# Patient Record
Sex: Female | Born: 1950
Health system: Southern US, Community
[De-identification: ages and names within clinical notes are randomized; demographics above are authoritative.]

## PROBLEM LIST (undated history)

## (undated) DIAGNOSIS — R609 Edema, unspecified: Secondary | ICD-10-CM

## (undated) DIAGNOSIS — H269 Unspecified cataract: Secondary | ICD-10-CM

## (undated) DIAGNOSIS — Z5189 Encounter for other specified aftercare: Secondary | ICD-10-CM

## (undated) DIAGNOSIS — M79604 Pain in right leg: Secondary | ICD-10-CM

## (undated) DIAGNOSIS — M1990 Unspecified osteoarthritis, unspecified site: Secondary | ICD-10-CM

## (undated) DIAGNOSIS — R413 Other amnesia: Secondary | ICD-10-CM

## (undated) DIAGNOSIS — K8020 Calculus of gallbladder without cholecystitis without obstruction: Secondary | ICD-10-CM

## (undated) DIAGNOSIS — F419 Anxiety disorder, unspecified: Secondary | ICD-10-CM

## (undated) DIAGNOSIS — Z923 Personal history of irradiation: Secondary | ICD-10-CM

## (undated) DIAGNOSIS — F1020 Alcohol dependence, uncomplicated: Secondary | ICD-10-CM

## (undated) DIAGNOSIS — K635 Polyp of colon: Secondary | ICD-10-CM

## (undated) DIAGNOSIS — I10 Essential (primary) hypertension: Secondary | ICD-10-CM

## (undated) DIAGNOSIS — M79605 Pain in left leg: Secondary | ICD-10-CM

## (undated) DIAGNOSIS — C4310 Malignant melanoma of unspecified eyelid, including canthus: Secondary | ICD-10-CM

## (undated) DIAGNOSIS — E7800 Pure hypercholesterolemia, unspecified: Secondary | ICD-10-CM

## (undated) DIAGNOSIS — Z9221 Personal history of antineoplastic chemotherapy: Secondary | ICD-10-CM

## (undated) DIAGNOSIS — C3490 Malignant neoplasm of unspecified part of unspecified bronchus or lung: Secondary | ICD-10-CM

## (undated) DIAGNOSIS — D649 Anemia, unspecified: Secondary | ICD-10-CM

## (undated) HISTORY — DX: Malignant melanoma of unspecified eyelid, including canthus: C43.10

## (undated) HISTORY — DX: Polyp of colon: K63.5

## (undated) HISTORY — DX: Pain in left leg: M79.605

## (undated) HISTORY — DX: Personal history of antineoplastic chemotherapy: Z92.21

## (undated) HISTORY — DX: Edema, unspecified: R60.9

## (undated) HISTORY — DX: Calculus of gallbladder without cholecystitis without obstruction: K80.20

## (undated) HISTORY — PX: OTHER SURGICAL HISTORY: SHX169

## (undated) HISTORY — DX: Anemia, unspecified: D64.9

## (undated) HISTORY — DX: Unspecified osteoarthritis, unspecified site: M19.90

## (undated) HISTORY — DX: Encounter for other specified aftercare: Z51.89

## (undated) HISTORY — DX: Personal history of irradiation: Z92.3

## (undated) HISTORY — DX: Unspecified cataract: H26.9

## (undated) HISTORY — PX: COLONOSCOPY: SHX174

## (undated) HISTORY — PX: POLYPECTOMY: SHX149

## (undated) HISTORY — PX: TUBAL LIGATION: SHX77

## (undated) HISTORY — DX: Pain in right leg: M79.604

---

## 1998-02-06 ENCOUNTER — Other Ambulatory Visit: Admission: RE | Admit: 1998-02-06 | Discharge: 1998-02-06 | Payer: Self-pay | Admitting: Obstetrics and Gynecology

## 2002-04-21 ENCOUNTER — Other Ambulatory Visit: Admission: RE | Admit: 2002-04-21 | Discharge: 2002-04-21 | Payer: Self-pay | Admitting: Obstetrics and Gynecology

## 2003-05-03 ENCOUNTER — Other Ambulatory Visit: Admission: RE | Admit: 2003-05-03 | Discharge: 2003-05-03 | Payer: Self-pay | Admitting: Obstetrics and Gynecology

## 2004-06-17 ENCOUNTER — Other Ambulatory Visit: Admission: RE | Admit: 2004-06-17 | Discharge: 2004-06-17 | Payer: Self-pay | Admitting: Obstetrics and Gynecology

## 2004-07-01 ENCOUNTER — Ambulatory Visit: Payer: Self-pay

## 2004-07-03 ENCOUNTER — Ambulatory Visit: Payer: Self-pay | Admitting: *Deleted

## 2004-08-05 ENCOUNTER — Ambulatory Visit: Payer: Self-pay | Admitting: Cardiology

## 2004-08-12 ENCOUNTER — Ambulatory Visit: Payer: Self-pay | Admitting: Cardiovascular Disease

## 2004-08-29 ENCOUNTER — Ambulatory Visit: Payer: Self-pay | Admitting: Internal Medicine

## 2005-03-18 ENCOUNTER — Ambulatory Visit: Payer: Self-pay | Admitting: Cardiology

## 2005-07-16 ENCOUNTER — Other Ambulatory Visit: Admission: RE | Admit: 2005-07-16 | Discharge: 2005-07-16 | Payer: Self-pay | Admitting: Obstetrics and Gynecology

## 2005-10-17 ENCOUNTER — Ambulatory Visit: Payer: Self-pay | Admitting: Cardiology

## 2005-11-19 ENCOUNTER — Ambulatory Visit: Payer: Self-pay | Admitting: Internal Medicine

## 2005-12-10 ENCOUNTER — Ambulatory Visit: Payer: Self-pay | Admitting: Internal Medicine

## 2005-12-11 ENCOUNTER — Ambulatory Visit: Payer: Self-pay | Admitting: Internal Medicine

## 2006-03-18 ENCOUNTER — Ambulatory Visit: Payer: Self-pay | Admitting: Internal Medicine

## 2006-07-07 ENCOUNTER — Ambulatory Visit: Payer: Self-pay

## 2006-07-09 ENCOUNTER — Ambulatory Visit: Payer: Self-pay | Admitting: Cardiology

## 2006-12-22 ENCOUNTER — Ambulatory Visit: Payer: Self-pay

## 2006-12-22 LAB — CONVERTED CEMR LAB
ALT: 25 units/L (ref 0–40)
AST: 31 units/L (ref 0–37)
Albumin: 4.3 g/dL (ref 3.5–5.2)
Alkaline Phosphatase: 26 units/L — ABNORMAL LOW (ref 39–117)
VLDL: 11 mg/dL (ref 0–40)

## 2007-01-04 ENCOUNTER — Ambulatory Visit: Payer: Self-pay | Admitting: Internal Medicine

## 2007-03-31 ENCOUNTER — Encounter (INDEPENDENT_AMBULATORY_CARE_PROVIDER_SITE_OTHER): Payer: Self-pay | Admitting: *Deleted

## 2007-04-20 ENCOUNTER — Telehealth (INDEPENDENT_AMBULATORY_CARE_PROVIDER_SITE_OTHER): Payer: Self-pay | Admitting: *Deleted

## 2007-05-18 ENCOUNTER — Ambulatory Visit: Payer: Self-pay | Admitting: Internal Medicine

## 2007-05-18 DIAGNOSIS — I10 Essential (primary) hypertension: Secondary | ICD-10-CM | POA: Insufficient documentation

## 2007-05-18 DIAGNOSIS — E785 Hyperlipidemia, unspecified: Secondary | ICD-10-CM | POA: Insufficient documentation

## 2007-05-19 ENCOUNTER — Encounter: Payer: Self-pay | Admitting: Internal Medicine

## 2007-05-19 LAB — CONVERTED CEMR LAB
Calcium: 10.2 mg/dL (ref 8.4–10.5)
Eosinophils Absolute: 0.2 10*3/uL (ref 0.0–0.6)
Eosinophils Relative: 2.5 % (ref 0.0–5.0)
GFR calc Af Amer: 96 mL/min
GFR calc non Af Amer: 79 mL/min
Glucose, Bld: 98 mg/dL (ref 70–99)
Lymphocytes Relative: 21.3 % (ref 12.0–46.0)
MCHC: 34.8 g/dL (ref 30.0–36.0)
MCV: 94.5 fL (ref 78.0–100.0)
Monocytes Relative: 7.3 % (ref 3.0–11.0)
Neutro Abs: 5.1 10*3/uL (ref 1.4–7.7)
Platelets: 287 10*3/uL (ref 150–400)

## 2007-07-09 ENCOUNTER — Ambulatory Visit: Payer: Self-pay | Admitting: Internal Medicine

## 2007-07-09 LAB — CONVERTED CEMR LAB
AST: 29 units/L (ref 0–37)
Albumin: 4.1 g/dL (ref 3.5–5.2)
Alkaline Phosphatase: 33 units/L — ABNORMAL LOW (ref 39–117)
Direct LDL: 151.1 mg/dL
HDL: 79.6 mg/dL (ref >39.0)
Total Bilirubin: 0.9 mg/dL (ref 0.3–1.2)

## 2007-07-12 ENCOUNTER — Ambulatory Visit: Payer: Self-pay | Admitting: Cardiology

## 2008-01-27 ENCOUNTER — Ambulatory Visit: Payer: Self-pay | Admitting: Internal Medicine

## 2008-02-01 ENCOUNTER — Ambulatory Visit: Payer: Self-pay | Admitting: Internal Medicine

## 2008-02-01 LAB — CONVERTED CEMR LAB
Alkaline Phosphatase: 35 units/L — ABNORMAL LOW (ref 39–117)
Bilirubin, Direct: 0.1 mg/dL (ref 0.0–0.3)
Total Bilirubin: 0.6 mg/dL (ref 0.3–1.2)
Total CHOL/HDL Ratio: 3.3
VLDL: 13 mg/dL (ref 0–40)

## 2008-03-16 ENCOUNTER — Ambulatory Visit: Payer: Self-pay | Admitting: Internal Medicine

## 2008-03-16 LAB — CONVERTED CEMR LAB
Direct LDL: 211.2 mg/dL
HDL: 81.3 mg/dL (ref >39.0)
Total Bilirubin: 1.1 mg/dL (ref 0.3–1.2)
Total CHOL/HDL Ratio: 3.7
Triglycerides: 75 mg/dL (ref 0–149)
VLDL: 15 mg/dL (ref 0–40)

## 2008-04-08 ENCOUNTER — Telehealth (INDEPENDENT_AMBULATORY_CARE_PROVIDER_SITE_OTHER): Payer: Self-pay | Admitting: *Deleted

## 2008-04-20 ENCOUNTER — Ambulatory Visit: Payer: Self-pay | Admitting: Cardiovascular Disease

## 2008-04-20 LAB — CONVERTED CEMR LAB
ALT: 29 units/L (ref 0–35)
Cholesterol: 234 mg/dL (ref 0–200)
Direct LDL: 108.1 mg/dL
HDL: 83.5 mg/dL (ref >39.0)
Total Protein: 6.7 g/dL (ref 6.0–8.3)
VLDL: 28 mg/dL (ref 0–40)

## 2008-04-27 ENCOUNTER — Ambulatory Visit: Payer: Self-pay | Admitting: Internal Medicine

## 2008-09-21 ENCOUNTER — Telehealth (INDEPENDENT_AMBULATORY_CARE_PROVIDER_SITE_OTHER): Payer: Self-pay | Admitting: *Deleted

## 2008-10-16 ENCOUNTER — Ambulatory Visit: Payer: Self-pay

## 2008-10-16 LAB — CONVERTED CEMR LAB
AST: 28 units/L (ref 0–37)
Albumin: 4.3 g/dL (ref 3.5–5.2)
Alkaline Phosphatase: 35 units/L — ABNORMAL LOW (ref 39–117)
Bilirubin, Direct: 0.1 mg/dL (ref 0.0–0.3)
Cholesterol: 244 mg/dL (ref 0–200)
Total CHOL/HDL Ratio: 2.9

## 2008-10-19 ENCOUNTER — Ambulatory Visit: Payer: Self-pay | Admitting: Internal Medicine

## 2008-11-14 ENCOUNTER — Ambulatory Visit: Payer: Self-pay | Admitting: Internal Medicine

## 2008-11-14 DIAGNOSIS — F17200 Nicotine dependence, unspecified, uncomplicated: Secondary | ICD-10-CM

## 2008-11-15 ENCOUNTER — Encounter (INDEPENDENT_AMBULATORY_CARE_PROVIDER_SITE_OTHER): Payer: Self-pay | Admitting: *Deleted

## 2008-11-15 LAB — CONVERTED CEMR LAB
BUN: 15 mg/dL (ref 6–23)
Basophils Absolute: 0 10*3/uL (ref 0.0–0.1)
Chloride: 104 meq/L (ref 96–112)
Creatinine, Ser: 0.8 mg/dL (ref 0.4–1.2)
Eosinophils Absolute: 0.3 10*3/uL (ref 0.0–0.7)
Glucose, Bld: 104 mg/dL — ABNORMAL HIGH (ref 70–99)
HCT: 39.8 % (ref 36.0–46.0)
Lymphs Abs: 1.8 10*3/uL (ref 0.7–4.0)
MCHC: 34.5 g/dL (ref 30.0–36.0)
MCV: 94.2 fL (ref 78.0–100.0)
Monocytes Absolute: 0.8 10*3/uL (ref 0.1–1.0)
Monocytes Relative: 7.6 % (ref 3.0–12.0)
Neutro Abs: 7.1 10*3/uL (ref 1.4–7.7)
Platelets: 231 10*3/uL (ref 150.0–400.0)
RDW: 11.6 % (ref 11.5–14.6)
TSH: 1.51 microintl units/mL (ref 0.35–5.50)

## 2008-11-16 LAB — CONVERTED CEMR LAB: Vit D, 25-Hydroxy: 14 ng/mL — ABNORMAL LOW (ref 30–89)

## 2008-11-27 ENCOUNTER — Telehealth (INDEPENDENT_AMBULATORY_CARE_PROVIDER_SITE_OTHER): Payer: Self-pay | Admitting: *Deleted

## 2009-01-18 ENCOUNTER — Telehealth (INDEPENDENT_AMBULATORY_CARE_PROVIDER_SITE_OTHER): Payer: Self-pay | Admitting: *Deleted

## 2009-04-16 ENCOUNTER — Ambulatory Visit: Payer: Self-pay | Admitting: Cardiology

## 2009-04-16 LAB — CONVERTED CEMR LAB
ALT: 24 units/L (ref 0–35)
AST: 28 units/L (ref 0–37)
Alkaline Phosphatase: 36 units/L — ABNORMAL LOW (ref 39–117)
Bilirubin, Direct: 0 mg/dL (ref 0.0–0.3)
Total Bilirubin: 0.8 mg/dL (ref 0.3–1.2)
Total CHOL/HDL Ratio: 2

## 2009-04-19 ENCOUNTER — Ambulatory Visit: Payer: Self-pay | Admitting: Cardiovascular Disease

## 2009-07-03 ENCOUNTER — Encounter: Payer: Self-pay | Admitting: Internal Medicine

## 2009-10-01 ENCOUNTER — Ambulatory Visit: Payer: Self-pay | Admitting: Cardiology

## 2009-10-04 ENCOUNTER — Ambulatory Visit: Payer: Self-pay | Admitting: Cardiology

## 2009-10-15 LAB — CONVERTED CEMR LAB
Alkaline Phosphatase: 35 units/L — ABNORMAL LOW (ref 39–117)
Bilirubin, Direct: 0 mg/dL (ref 0.0–0.3)
LDL Cholesterol: 78 mg/dL (ref 0–99)
Total CHOL/HDL Ratio: 2
Total Protein: 6.7 g/dL (ref 6.0–8.3)
VLDL: 26.2 mg/dL (ref 0.0–40.0)

## 2009-12-05 ENCOUNTER — Encounter (INDEPENDENT_AMBULATORY_CARE_PROVIDER_SITE_OTHER): Payer: Self-pay | Admitting: *Deleted

## 2009-12-05 ENCOUNTER — Telehealth (INDEPENDENT_AMBULATORY_CARE_PROVIDER_SITE_OTHER): Payer: Self-pay | Admitting: *Deleted

## 2009-12-20 ENCOUNTER — Telehealth: Payer: Self-pay | Admitting: Cardiology

## 2010-01-28 ENCOUNTER — Telehealth (INDEPENDENT_AMBULATORY_CARE_PROVIDER_SITE_OTHER): Payer: Self-pay | Admitting: *Deleted

## 2010-03-01 ENCOUNTER — Ambulatory Visit: Payer: Self-pay | Admitting: Internal Medicine

## 2010-03-01 DIAGNOSIS — M810 Age-related osteoporosis without current pathological fracture: Secondary | ICD-10-CM

## 2010-03-04 LAB — CONVERTED CEMR LAB
BUN: 14 mg/dL (ref 6–23)
Chloride: 109 meq/L (ref 96–112)
Eosinophils Relative: 2.5 % (ref 0.0–5.0)
GFR calc non Af Amer: 101.64 mL/min (ref >60)
Glucose, Bld: 77 mg/dL (ref 70–99)
HCT: 41.3 % (ref 36.0–46.0)
Hemoglobin: 14.2 g/dL (ref 12.0–15.0)
Lymphs Abs: 1.3 10*3/uL (ref 0.7–4.0)
MCV: 96 fL (ref 78.0–100.0)
Monocytes Absolute: 0.5 10*3/uL (ref 0.1–1.0)
Monocytes Relative: 6 % (ref 3.0–12.0)
Neutro Abs: 5.7 10*3/uL (ref 1.4–7.7)
Platelets: 213 10*3/uL (ref 150.0–400.0)
Potassium: 5.2 meq/L — ABNORMAL HIGH (ref 3.5–5.1)
RDW: 13 % (ref 11.5–14.6)
Sodium: 143 meq/L (ref 135–145)
WBC: 7.8 10*3/uL (ref 4.5–10.5)

## 2010-04-03 ENCOUNTER — Telehealth (INDEPENDENT_AMBULATORY_CARE_PROVIDER_SITE_OTHER): Payer: Self-pay | Admitting: *Deleted

## 2010-06-27 ENCOUNTER — Encounter (INDEPENDENT_AMBULATORY_CARE_PROVIDER_SITE_OTHER): Payer: Self-pay | Admitting: *Deleted

## 2010-09-26 NOTE — Assessment & Plan Note (Signed)
Summary: cpx /cbs--Rm 12   Vital Signs:  Patient profile:   60 year old female Height:      64.5 inches Weight:      141.38 pounds BMI:     23.98 Temp:     98.0 degrees F oral Pulse rate:   72 / minute Pulse rhythm:   regular Resp:     16 per minute BP sitting:   110 / 80  (left arm) Cuff size:   regular CC: Room 12  Physical, fasting. Is Patient Diabetic? No   History of Present Illness: CPX wt stable per her home scale (our scale showed 6 pounds down)  Preventive Screening-Counseling & Management  Alcohol-Tobacco     Alcohol drinks/day: 2-3 glasses on weekend     Alcohol type: wine     Smoking Status: current  Caffeine-Diet-Exercise     Caffeine use/day: 3 cups coffee daily     Does Patient Exercise: yes     Type of exercise: cardio, weights     Exercise (avg: min/session): 1 1/2 hours     Times/week: <3  Allergies: 1)  ! Pcn  Past History:  Past Medical History: Reviewed history from 05/18/2007 and no changes required. Hyperlipidemia Hypertension Osteopenia  Past Surgical History: Reviewed history from 11/14/2008 and no changes required. 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 CV:  Denies chest pain or discomfort, palpitations, and swelling of feet. Resp:  Denies cough and shortness of breath. GI:  Denies bloody stools, nausea, and vomiting. GU:  sees gyn . Psych:  Denies anxiety and depression; sleeps well .  Physical Exam  General:  alert, well-developed, and well-nourished.   Neck:  no masses and no thyromegaly.   Lungs:  normal respiratory effort, no intercostal retractions, no accessory muscle use, and normal breath sounds.   Heart:  normal rate, regular  rhythm, no murmur, and no gallop.   Abdomen:  soft, non-tender, normal bowel sounds, no distention, no masses, no guarding, and no rigidity.   Extremities:  no pretibial edema bilaterally  Neurologic:  alert & oriented X3, strength normal in all extremities, and gait normal.   Psych:  Cognition and judgment appear intact. Alert and cooperative with normal attention span and concentration. not anxious appearing and not depressed appearing.     Impression & Recommendations:  Problem # 1:  HEALTH SCREENING (ICD-V70.0) Td shot 2008  sees gyn: they take care of osteoporosis-MMG-PAP  colonoscopy in 2007 as requested by her gynecologist  (@ [facility], 2 polyps ,  told to repeat in 5 years per patient) continue healthy life style (except for tobacco) tobacco--  counseled , information provided regards the quitting seminar at the hospital on ASA  Orders: TLB-BMP (Basic Metabolic Panel-BMET) (80048-METABOL) TLB-CBC Platelet - w/Differential (85025-CBCD) TLB-TSH (Thyroid Stimulating Hormone) (84443-TSH) Venipuncture (11914)  Problem # 2:  OSTEOPOROSIS (ICD-733.00) per gyn just had a DEXA  Her updated medication list for this problem includes:    Boniva 150 Mg Tabs (Ibandronate sodium) .Marland Kitchen... 1 by mouth qmo  Problem # 3:  HYPERTENSION (ICD-401.9) at goal  Her updated medication list for this problem includes:    Lisinopril 20 Mg Tabs (Lisinopril) .Marland Kitchen... 1 by mouth once daily -  due office visit for additional refills  BP today: 110/80 Prior BP: 162/90 (10/04/2009)  Labs Reviewed: K+: 4.0 (11/14/2008) Creat: : 0.8 (11/14/2008)   Chol: 184 (10/01/2009)   HDL: 80.10 (10/01/2009)   LDL: 78 (10/01/2009)   TG: 131.0 (10/01/2009)  Problem # 4:  HYPERLIPIDEMIA (ICD-272.4) at goal  used  to see the cholesterol clinic, they recommended followup here. Her updated medication list for this problem includes:    Zetia 10 Mg Tabs (Ezetimibe) .Marland Kitchen... Take 1 tablet by mouth once a day    Lipitor 80 Mg Tabs  (Atorvastatin calcium) .Marland Kitchen... 1  by mouth once daily  Labs Reviewed: SGOT: 36 (10/01/2009)   SGPT: 36 (10/01/2009)   HDL:80.10 (10/01/2009), 82.40 (04/16/2009)  LDL:78 (10/01/2009), 108 (16/05/9603)  Chol:184 (10/01/2009), 199 (04/16/2009)  Trig:131.0 (10/01/2009), 43.0 (04/16/2009)  Complete Medication List: 1)  Zetia 10 Mg Tabs (Ezetimibe) .... Take 1 tablet by mouth once a day 2)  Lipitor 80 Mg Tabs (Atorvastatin calcium) .Marland Kitchen.. 1  by mouth once daily 3)  Lisinopril 20 Mg Tabs (Lisinopril) .Marland Kitchen.. 1 by mouth once daily - due office visit for additional refills 4)  Boniva 150 Mg Tabs (Ibandronate sodium) .Marland Kitchen.. 1 by mouth qmo 5)  Baby Aspirin 81 Mg Chew (Aspirin) .Marland Kitchen.. 1  a day 6)  Calcium and Vit. D Every Day   Patient Instructions: 1)  Please schedule a follow-up appointment in 6 months .  Prescriptions: LISINOPRIL 20 MG  TABS (LISINOPRIL) 1 by mouth once daily - DUE OFFICE VISIT FOR ADDITIONAL REFILLS  #90 x 3   Entered and Authorized by:   Nolon Rod. Jaylee Freeze MD   Signed by:   Nolon Rod. Moustafa Mossa MD on 03/01/2010   Method used:   Print then Give to Patient   RxID:   [id number] LIPITOR 80 MG TABS (ATORVASTATIN CALCIUM) 1  by mouth once daily  #90 x 3   Entered and Authorized by:   Nolon Rod. Jefferie Holston MD   Signed by:   Nolon Rod. Delise Simenson MD on 03/01/2010   Method used:   Print then Give to Patient   RxID:   [id number] ZETIA 10 MG TABS (EZETIMIBE) Take 1 tablet by mouth once a day  #90 x 3   Entered and Authorized by:   Nolon Rod. Taliyah Watrous MD   Signed by:   Nolon Rod. Dacian Orrico MD on 03/01/2010   Method used:   Print then Give to Patient   RxID:   [id number]   Current Allergies (reviewed today): ! PCN   Preventive Care Screening  Bone Density:    Date:  09/25/2009    Results:  abnormal--osteoporosis hips std dev  Mammogram:    Date:  09/25/2009    Results:  normal   Pap Smear:    Date:  09/25/2009    Results:  normal

## 2010-09-26 NOTE — Miscellaneous (Signed)
Summary: got @ [facility]   Immunization History:  Influenza Immunization History:    Influenza:  got @ [facility]  (06/24/2010) 

## 2010-09-26 NOTE — Letter (Signed)
Summary: Primary Care Appointment Letter  [facility] [place]  [address] [place], [place] [address]   Phone: [phone]  Fax: [phone]    12/05/2009 MRN: [id number]  [name] [name] [address] [place], [place]  [address]  Dear Ms. Schwarting,   Your Primary Care Physician McFarlan E. Paz MD has indicated that:    ____x___it is time to schedule an appointment.  Please call our office 2 [phone] to schedule a complete physical exam with Dr. Drue Novel.      Thank you,    [facility] Primary [facility] Scheduler

## 2010-09-26 NOTE — Progress Notes (Signed)
Summary: refill  Phone Note Refill Request Message from:  Fax from Pharmacy on December 05, 2009 4:02 PM  Refills Requested: Medication #1:  LISINOPRIL 20 MG  TABS 1 by mouth qd [facility] [phone]   Method Requested: Fax to Local Pharmacy Next Appointment Scheduled: no appt Initial call taken by: Barb Merino,  December 05, 2009 4:02 PM    New/Updated Medications: LISINOPRIL 20 MG  TABS (LISINOPRIL) 1 by mouth once daily - DUE OFFICE VISIT FOR ADDITIONAL REFILLS Prescriptions: LISINOPRIL 20 MG  TABS (LISINOPRIL) 1 by mouth once daily - DUE OFFICE VISIT FOR ADDITIONAL REFILLS  #90 x 0   Entered by:   Shary Decamp   Authorized by:   Nolon Rod. Paz MD   Signed by:   Shary Decamp on 12/05/2009   Method used:   Printed then faxed to ...       [facility])       [address] [address]       [place], [place]  [address]       Ph: [phone]       Fax: [phone]   RxID:   [id number]

## 2010-09-26 NOTE — Progress Notes (Signed)
Summary: FYI  Phone Note Call from Patient Call back at [facility] Phone [phone]   Caller: Patient Summary of Call: Patient called to cancel her CPX appt because she just found out that her husband's job does not offer benefits. They are trying to work something out this week with getting new insurance and wanted to make sure her medicines could still be refilled. She is going to call back this week and reschedule her CPX appt.  Initial call taken by: Harold Barban,  January 28, 2010 8:33 AM  Follow-up for Phone Call        ok RF all  meds x 6 months  Follow-up by: Nolon Rod. Paz MD,  January 28, 2010 8:40 AM  Additional Follow-up for Phone Call Additional follow up Details #1::        DISCUSS WITH PATIENT, NO REFILLS NEEDED AT THIS TIM will call for refills..............Marland KitchenFelecia Deloach CMA  January 28, 2010 9:00 AM

## 2010-09-26 NOTE — Progress Notes (Signed)
Summary: refill  Phone Note Refill Request Message from:  Patient  Refills Requested: Medication #1:  ZETIA 10 MG TABS Take 1 tablet by mouth once a day  Medication #2:  LIPITOR 80 MG TABS 1  by mouth once daily  Medication #3:  LISINOPRIL 20 MG  TABS 1 by mouth once daily - DUE OFFICE VISIT FOR ADDITIONAL REFILLS [facility] patient was given rx at [facility] -she misplaced it    Initial call taken by: Okey Regal [place],  April 03, 2010 3:27 PM    Prescriptions: LISINOPRIL 20 MG  TABS (LISINOPRIL) 1 by mouth once daily - DUE OFFICE VISIT FOR ADDITIONAL REFILLS  #90 x 3   Entered by:   Army Fossa CMA   Authorized by:   Nolon Rod. Paz MD   Signed by:   Army Fossa CMA on 04/03/2010   Method used:   Faxed to ...       [facility] [id number] (retail)       [address]       [place], [place]  [address]       Ph: [phone]       Fax: [phone]   RxID:   [id number] LIPITOR 80 MG TABS (ATORVASTATIN CALCIUM) 1  by mouth once daily  #90 x 3   Entered by:   Army Fossa CMA   Authorized by:   Nolon Rod. Paz MD   Signed by:   Army Fossa CMA on 04/03/2010   Method used:   Faxed to ...       [id number]* (retail)       [address]       [place], [place]  [address]       Ph: [phone]       Fax: [phone]   RxID:   [id number] ZETIA 10 MG TABS (EZETIMIBE) Take 1 tablet by mouth once a day  #90 x 3   Entered by:   Army Fossa CMA   Authorized by:   Nolon Rod. Paz MD   Signed by:   Army Fossa CMA on 04/03/2010   Method used:   Faxed to ...       [facility] [id number] (retail)       [address]       [place], [place]  [address]       Ph: [phone]       Fax: [phone]   RxID:   [id number]

## 2010-09-26 NOTE — Assessment & Plan Note (Signed)
Summary: rov/tm   Visit Type:  Follow-up   [name] [name] comes in today for 6 month follow-up of her hyperlipidemia therapy.  She denies any new muscle aches or pains.  She has been compliant with her medications.    Lipid Management Provider  Charolotte Eke, PharmD  Preventive Screening-Counseling & Management  Alcohol-Tobacco     Smoking Status: current     Smoking Cessation Counseling: yes     Smoke Cessation Stage: contemplative  Current Medications (verified): 1)  Zetia 10 Mg Tabs (Ezetimibe) .... Take 1 Tablet By Mouth Once A Day 2)  Lipitor 80 Mg Tabs (Atorvastatin Calcium) .Marland Kitchen.. 1  By Mouth Once Daily 3)  Lisinopril 20 Mg  Tabs (Lisinopril) .Marland Kitchen.. 1 By Mouth Qd 4)  Boniva 150 Mg  Tabs (Ibandronate Sodium) .Marland Kitchen.. 1 By Mouth Qmo 5)  Baby Aspirin 81 Mg Chew (Aspirin) .Marland Kitchen.. 1  A Day 6)  Calcium and Vit. D Every Day  Allergies (verified): 1)  ! Pcn   Vital Signs:  Patient profile:   60 year old female Weight:      147 pounds Pulse rate:   80 / minute BP sitting:   162 / 90  (left arm)  Impression & Recommendations:  Problem # 1:  HYPERLIPIDEMIA (ICD-272.4) [name] [name] is tolerating her current medications.  Her TC, TG, HDL, and LDL are all at goals.  Her LFTs are normal.  She will continue current medications and follow-up with her PCP for future monitoring.  If something changes, we would be glad to see her again.  Her updated medication list for this problem includes:    Zetia 10 Mg Tabs (Ezetimibe) .Marland Kitchen... Take 1 tablet by mouth once a day    Lipitor 80 Mg Tabs (Atorvastatin calcium) .Marland Kitchen... 1  by mouth once daily  Problem # 2:  HYPERTENSION (ICD-401.9) BP today was 150/95, repeated by an RN it was 162/90.  HR 80.  She has been compliant with her BP med. and has been feeling fine.  She will recheck it at home tonight and over the next few days.  She will call Dr. Leta Jungling office if SBP remains >140.  She also has an OB/GYN appointment in March.  Her updated medication  list for this problem includes:    Lisinopril 20 Mg Tabs (Lisinopril) .Marland Kitchen... 1 by mouth qd    Baby Aspirin 81 Mg Chew (Aspirin) .Marland Kitchen... 1  a day  Problem # 3:  TOBACCO ABUSE (ICD-305.1) I again encouraged [name] [name] to try to quit smoking.  We reviewed the available methods available.  She wants to stop but hasn't had the will power.

## 2010-09-26 NOTE — Progress Notes (Signed)
Summary: refill**[facility]**   Phone Note Refill Request Message from:  Pharmacy on December 20, 2009 11:52 AM  Refills Requested: Medication #1:  LIPITOR 80 MG TABS 1  by mouth once daily   Supply Requested: 3 months [facility] [phone] red ID [id number], per Olegario Messier   Method Requested: Telephone to Pharmacy Initial call taken by: Migdalia Dk,  December 20, 2009 11:53 AM  Follow-up for Phone Call        Filled RX X1. Pt is to get additional refills from Dr. Drue Novel per Charolotte Eke note on 10/04/09. Marrion Coy, CNA  December 20, 2009 12:04 PM  Follow-up by: Marrion Coy, CNA,  December 20, 2009 12:04 PM

## 2011-01-07 NOTE — Assessment & Plan Note (Signed)
[facility]                               LIPID CLINIC NOTE   NAME:[name] [name], [name] [name]                MRN:          [id number]  DATE:04/27/2008                            DOB:          1951-04-13    PAST MEDICAL HISTORY:  1. Hyperlipidemia.  2. Osteoporosis.  3. Hypertension.   MEDICATIONS:  1. Boniva.  2. Calcium with vitamin D twice a day.  3. Zetia 10 mg a day.  4. Lisinopril 20 mg a day.  5. Lipitor 80 mg a day.   PHYSICAL EXAMINATION:  VITAL SIGNS:  Blood pressure 142/80, heart rate  70, and weight 145 pounds.   LABORATORY DATA:  Total cholesterol 234, triglyceride 141, HDL of 83,  and LDL 122.   ASSESSMENT:  [name] is a very pleasant 60 year old woman who  returns to Lipid Clinic today with no chest pain, no shortness of  breath, no muscle aches or pains.  She is very very pleased to report  that after switching from Lipitor 80 mg large tablet to 20 mg taking 4  tablets each evening and changing her administration time from bedtime  to her evening meal.  She has become much more compliant with her  Lipitor.  She had previously not been taking it because of not being  able to swallow the large pill and forgetting it to take at bedtime.  With these changes, she has been compliant with her medication and has  brought her total cholesterol slightly above goal of less than 200,  triglycerides at goal of less than 150, HDL at goal of greater than 40,  and LDL improved from 211 down to 122, but still slightly above goal of  less than 100.  She is primary risk reduction because she is a smoker  and hypertension, we will are encouraging her to decrease her LDL to  less than 100.  She is very conscientious of her diet.  She says that  she tries to eat low-fat and low-carbohydrate meals.  However, she does  occasionally splurge and have Pizza or high fat content, sweet choices  whenever she goes out.  For the most part, she eats lots  of vegetables,  low-fat protein, and small amount of carbohydrates.  She will be more  conscientious to decrease her portion sizes and decreased the amount of  fat in her diet.  For exercise, she is working out with a trainer 3  times a week and I have encouraged her on a fourth day to do a cardio  workout whether it would be walking or riding a bike, stationary bike,  etc.   PLAN:  1. Continue current medication Lipitor 80 mg daily and Zetia 10 mg      daily.  2. Continue low-fat and low-carbohydrate diet.  3. Continue exercise and possibly add an 1 extra day of cardia      workout.  4. Follow on visit for 6 months.  Lipid panel and LFTs.  We will make      adjustments at that time.      Leota Sauers, PharmD  Electronically Signed  Thomas C. Daleen Squibb, MD, [facility]  Electronically Signed   LC/MedQ  DD: 04/27/2008  DT: 04/28/2008  Job #: [id number]

## 2011-01-07 NOTE — Assessment & Plan Note (Signed)
[facility]                               LIPID CLINIC NOTE   NAME:Gfeller, Carolyne                MRN:          [id number]  DATE:07/12/2007                            DOB:          1951-04-15    Ms. Siracusa is seen in the lipid clinic for further evaluation and  medication titration associated with her hyperlipidemia in the setting  of multiple risk factors.  The patient states that she has been  compliant with her Lipitor 40 mg daily, her Boniva and her Zetia.  She  states that she has been taking lisinopril 10 mg p.o. daily.  She has  questions about her blood pressure today, as well as her lipid panel.  She states that she has been exercising with her personal trainer twice  a week.  She states that she has been following a relatively low-fat,  low-cholesterol diet, and states that she has not missed doses of her  Lipitor.   PAST MEDICAL HISTORY:  1. Hypertension.  2. Hyperlipidemia.  3. High-risk features.  4. Smoking, of which she continues currently and is not willing to      discuss quitting today.  5. She has continued to choose relatively low-fat, low-cholesterol      foods.   CURRENT MEDICATIONS:  1. Boniva 150 mg once a month.  2. Calcium with D daily.  3. Viactiv chews.  4. Lisinopril 10 mg daily.  5. Zetia 10 mg daily.   REVIEW OF SYSTEMS:  As stated in the HPI and otherwise negative.   PHYSICAL EXAMINATION:  Weight is 140 pounds, blood pressure is 153/86,  heart rate is 72, respirations are 16.   Labs on November 14 reveal normal liver function tests and alk that is  slightly low at 33.  Total cholesterol 236, triglycerides 59, HDL 79.6,  LDL direct 151.1.   ASSESSMENT:  1. The patient states she has not been missing doses, though in the      past when she has taken her Lipitor 40 mg daily she has been      compliant, and her labs for her LDL has been in the 100s.  This      value in light of that history and  her periodic noncompliance makes      me question if she is actively taking her medication as prescribed.      I have asked the patient to increase, and she has agreed to      increase, the Lipitor 80 mg daily, continuing her Zetia for now.      Followup will be in 12 weeks for that.  2. Blood pressure:  The patient's blood pressure is elevated in the      office today.  She states that Dr. Drue Novel did not choose to change      medicines the last time she was in his office when it was elevated.      Today I will increase her lisinopril to 20 mg p.o. daily.  She      understands that she is to come back in within 7 to 14 days,  preferably prior to thanksgiving for a metabolic      panel to assess her renal function and potassium.  She agrees to      comply with this request.  Followup is scheduled in 2 weeks for lab      work and in 12 weeks, otherwise.      Shelby Dubin, PharmD, BCPS, CPP  Electronically Signed      Rollene Rotunda, MD, [facility]  Electronically Signed   MP/MedQ  DD: 07/14/2007  DT: 07/15/2007  Job #: [id number]   cc:   [name], MD

## 2011-01-07 NOTE — Assessment & Plan Note (Signed)
[facility]                               LIPID CLINIC NOTE   NAME:Burbano, Raiana                MRN:          [id number]  DATE:10/19/2008                            DOB:          07/01/1951    Ms. Rabe comes in today for followup of her hyperlipidemia  therapy, which includes Lipitor 40 mg daily and Zetia 10 mg daily.  She  has been compliant with these medications for the most part, although  she admits to missing Lipitor dose 1 day per week.  She denies any  muscle aches, pains, or any other problems.  Although, she continues as  she has in the past to complain of swallowing large tablets.   Current medications have not changed and they include Boniva, calcium  with vitamin D, and lisinopril.   PHYSICAL EXAMINATION:  VITAL SIGNS:  Her weight is 145 pounds, blood  pressure is 110/80, and heart rate is 80.   LABORATORY DATA:  Total cholesterol 244, triglycerides 75, HDL 83.7, and  LDL 138.2.  Liver enzymes are within normal limits.   ASSESSMENT:  Mr. Test LDL remains above goal.  Her  triglycerides are improved and are still at goal and HDL is quite high  and greater than goal.  We talked about compliance with the Lipitor  being the important thing.  She would like to continue with the current  doses of Lipitor, but increase her compliance as well as her amount of  exercise.   PLAN:  We are going to continue with the current cholesterol medications  and encouraging compliance.  We will increase Lipitor to 80 mg daily  next time if her LDL remains greater than a goal of less than 100.  I  encouraged her with her exercise program and diet and we will follow up  with her in 6 months.      Charolotte Eke, PharmD  Electronically Signed      Rollene Rotunda, MD, [facility]  Electronically Signed   TP/MedQ  DD: 10/19/2008  DT: 10/20/2008  Job #: [id number]

## 2011-01-07 NOTE — Assessment & Plan Note (Signed)
[facility]                               LIPID CLINIC NOTE   NAME:Bartelson, Phelan                MRN:          [id number]  DATE:01/04/2007                            DOB:          [birth year]    [name] is seen back in the lipid clinic for further evaluation,  medication evaluation, and medication titration associated with her  Lipitor 40 mg daily and Zetia 10 mg daily.  The patient states that she  has been compliant and tolerates this double therapy nicely.  The  patient states she has resumed smoking and may reconsider use of  Chantix.  She states that she has begun training with a new physical  trainer twice weekly as of February.  She continues to use [employer]  products for lunches and drinks V-8 low sodium drinks for breakfast.  The patient's weight is 144-3/4 pounds.  Her heart rate is 68, and her  respirations are 17.   PAST MEDICAL HISTORY:  Pertinent for high risk features and history of  tobacco abuse, hypertension.   LABORATORY DATA:  On Jan 04, 2007, revealed total cholesterol 191,  triglycerides 57, HDL 75.4, LDL 104.  LFTs are within normal limits.   ASSESSMENT:  The patient approaches lipid lowering goals on combination  lipid lowering therapy.  Unfortunately, she has continued to use tobacco  products on a regular basis.   PLAN:  1. Medication therapy.  The patient will continue Lipitor 40 mg daily      and Zetia 10 mg daily.  2. Physical activity.  The patient will continue working out with her      trainer and add 2-3 additional workouts each week, working towards      at least 30 minutes of brisk, physical activity most days of the      week as per [facility] guideline recommendations.  3. The patient will work to increase her lean fish and lean meat      intake.  Increase her vegetable intake, reducing her simple      carbohydrates.  __________ extensively with the patient on methods      to decrease her carb  intake and substitute whole grain pasta and      breads, etc.  The patient will follow up in 6 months, November 13th      at 3 p.m. with labs just before.   Thank you for the opportunity to see this nice patient.      Shelby Dubin, PharmD, BCPS, CPP       Rollene Rotunda, MD, [facility]    MP/MedQ  DD: 01/06/2007  DT: 01/06/2007  Job #: [id number]   cc:   [name], MD

## 2011-01-07 NOTE — Assessment & Plan Note (Signed)
[facility]                               LIPID CLINIC NOTE   NAME:[name] [name]                MRN:          [id number]  DATE:01/27/2008                            DOB:          [birth year]    Ms. [name] [name] comes in today for followup of her hyperlipidemia  therapy which includes Zetia 10 mg daily and Lipitor 80 mg daily.  She  has been complaint with both of these and tolerating them just fine with  no muscle aches or pains.   Other medications are unchanged and they include Boniva, calcium with  vitamin D, and Lisinopril.   PHYSICAL EXAMINATION:  Includes a weight of 142 pounds, blood pressure  is 125/70, heart rate is 80.   We have no current lab values for this visit.   ASSESSMENT:  Ms. [name] [name] appears to be tolerating her medications  fine which include the Lipitor which was recently increased from 40 mg  to 80 mg daily.  We will get the blood drawn on June 9, and we will  review those results and call her with the plan and make a followup  appointment at that time.  The rest of the visit with [name] [name]  was spent talking about smoking cessation which has been a challenge for  her for quite some time.  We talked about different strategies including  using a stop date, using nicotine replacement such as gum or patches,  using Wellbutrin or mood stabilizing, and using Chantix.  She was  resistant to using Chantix.  She had tried Wellbutrin in the past but  failed to stop smoking.  She has never tried any nicotine replacement.  I encouraged her to think about the advantages of stopping smoking  including financial, in addition to bringing her blood pressure under  control and possibly even eliminating the need to take medicine to  reduce it.  She seemed encouraged, and I told her we would be available  to counsel her and help her lipids problem in the future.      Charolotte Eke, PharmD  Electronically Signed      Rollene Rotunda, MD, [facility]  Electronically Signed   TP/MedQ  DD: 01/27/2008  DT: 01/27/2008  Job #: [id number]

## 2011-01-10 NOTE — Assessment & Plan Note (Signed)
[facility]                               LIPID CLINIC NOTE   NAME:[name], [name]                MRN:          [id number]  DATE:02/01/2008                            DOB:          Nov 06, 1950    Telephone call to the patient is 3:45 p.m.  [name] is a well-  known to me from the lipid clinic.  She was followed up last week, and  had not had labs drawn at that time.  Labs are now available.  They  reveal normal LFTs, excepting alkaline phosphatase of 35, total  cholesterol is 255, triglycerides 67, HDL 76.6, LDL direct is 166.5.  I  have called the patient this afternoon and spoken directly with her.  I  have reviewed her lab results with her.  She states that in a given 7-  day period of time, she missed 2 or 3 doses of her cholesterol  medication because she takes it at night.  We discussed the importance  of adherence and compliance with this, and I have asked her to move her  medicine, at her suggestion, to first thing in the morning with her  blood pressure medicine.  She agrees to do this.  We have agreed that  she can do this for a period of 4-6 weeks.  We will check her labs back  on Wednesday, 07/22.  She will come in for fasting blood work, lipid and  liver on that day, and will telephone her to follow up on those labs at  that time.  She will call with questions or problems such as muscle  aches, pain, darkening of urine, side pain, flank pain in the meantime.      Shelby Dubin, PharmD, BCPS, CPP  Electronically Signed      Bevelyn Buckles. Bensimhon, MD  Electronically Signed   MP/MedQ  DD: 02/02/2008  DT: 02/02/2008  Job #: [id number]

## 2011-01-10 NOTE — Assessment & Plan Note (Signed)
[facility]                                 LIPID CLINIC NOTE   NAME:[name], [name]                MRN:          [id number]  DATE:07/09/2006                            DOB:          1950-12-27    RETURN OFFICE VISIT FOR LIPID CLINIC   PAST MEDICAL HISTORY:  1. Hyperlipidemia.  2. Hypertension.  3. Positive tobacco abuse.  4. Osteopenia.   CURRENT MEDICATIONS:  1. Lipitor 40 mg daily.  2. Zetia 10 mg daily.  3. Boniva weekly.  4. Calcium with vitamin D once daily.  5. Lisinopril, unknown dose, daily.   VITAL SIGNS: Weight is 141 pounds, blood pressure is 122/78, heart rate is  75.   LABORATORY DATA:  Total cholesterol is 221, triglycerides 63, HDL of 79, LDL  of 136 and LFTs within normal limits.   ASSESSMENT:  [name] [name] is a very pleasant, young woman who returns to  Lipid Clinic today with no chest pain, no shortness of breath, no muscle  aches or pains. She is compliant with current medication regimen. She is,  however, very noncompliant with her exercise regimen. She had previously  been working out with a Psychologist, educational, but for some reason had stopped working out  and at last visit said that she had planned to restart her exercise regimen;  however, she has not done that. She sporadically walks maybe 30 minutes on a  treadmill 1-2 times a week. She is willing to restart with a trainer. She  does understand the importance of an exercise regimen and how this will aid  in cardiovascular health and decreasing her LDL down to goal. She eats  fairly heart healthy diet. She eats a breakfast bar in the morning and [employer] for lunch and a very healthy low-fat meal for her evening meal. She  does not snack on any chips. She occasionally snacks on low-fat popcorn and  has approximately a quarter to a half of cup of nuts, either almonds or  peanuts, a day. She measures them out and takes them to work with her to  snack on through  the day.   Her biggest vice is her smoking habit, which she in the past has thought  about quitting smoking, but had not made any active points to do so. We had  a lengthy discussion today regarding the benefit of tobacco cessation and  she is willing to consider it. I have guided her on picking a quit date and  sticking to it, different methods of aiding in her smoking cessation and  also things to do whenever she has the triggers or want or need for a  cigarette. She said that her family is very supportive and her friends are  very supportive of her quitting smoking and hopefully she will be able to do  this along with their help.   Her total cholesterol is greater than goal of less than 200; triglycerides  at goal of less than 150; HDL is at goal of greater than 40; LDL remains  above goal of less than 130. However, she does understand the importance  of  pushing to as close to 100 as possible and is willing to do so. She is  unwilling to increase her Lipitor at this time. She really thinks that with  increasing her exercise she will be able to decrease her LDL and would like  to give that a chance. I am agreeable to that. However, she does understand  that in 6 months time, after she has been working out, if her LDL is not  closer to goal that we will increase her Lipitor to 80.   PLAN:  1. Continue current medication regimen.  2. Increase Lipitor to 80 mg at next visit if needed to reach target goal      of LDL of 100. Continue heart healthy diet.  3. Restart exercise regimen.  4. Smoking cessation.  5. Followup visit in 6 months for lipid panel and LFTs and will make      adjustments as needed at that time.      Leota Sauers, PharmD  Electronically Signed      Jesse Sans. Daleen Squibb, MD, [facility]  Electronically Signed   LC/MedQ  DD: 07/09/2006  DT: 07/09/2006  Job #: [id number]

## 2011-01-10 NOTE — Assessment & Plan Note (Signed)
[facility]                               LIPID CLINIC NOTE   NAME:[name] [name], [name] [name]                MRN:          [id number]  DATE:03/20/2008                            DOB:          1951-05-09    DATE TELEPHONE CALL:  March 20, 2008.   I spoke with the patient tonight at 6 o'clock to discuss her labs that  were collected on March 16, 2008.  Her LFTs were normal, her total  cholesterol was 382, her triglycerides were 75, her HDL was 81.3, and  her LDL directly measured was 841.3.  I called the patient and left a  message and she called me back about 6:15.  She is having problems  swallowing the Lipitor 80 and believes that even though she has maybe  missed 5 doses in the past week that much of this seems from for her  extreme anxiety associated with this tablet size, so she has asked if  there are any smaller tablets of Lipitor.  I have at her request called  the [facility] on [address] and spoken with gentleman pharmacist there  tonight at [employer].  I have called in 3 refills of Lipitor 20-mg tablets, a  quantity of 4 tablets daily, a month's supply with 3 refills under [name], MD.  She will call me with questions or problems in the meantime,  and we will check labs on Thursday, April 20, 2008, with questions or  problems in the meantime, she will call us directly and work to identify  other issues that we need to address.  Time spent with the patient on  the phone is approximately 20 minutes.      Shelby Dubin, PharmD, BCPS, CPP  Electronically Signed      Madolyn Frieze. Jens Som, MD, [facility]  Electronically Signed   MP/MedQ  DD: 03/20/2008  DT: 03/21/2008  Job #: [id number]

## 2011-04-16 ENCOUNTER — Other Ambulatory Visit: Payer: Self-pay | Admitting: Internal Medicine

## 2011-04-26 HISTORY — PX: CHOLECYSTECTOMY: SHX55

## 2011-05-23 ENCOUNTER — Observation Stay (HOSPITAL_COMMUNITY)
Admission: AD | Admit: 2011-05-23 | Discharge: 2011-05-26 | DRG: 493 | Disposition: A | Discharge status: Home / Self Care | Payer: BC Managed Care – PPO | Source: Other Acute Inpatient Hospital | Attending: Internal Medicine | Admitting: Internal Medicine

## 2011-05-23 ENCOUNTER — Emergency Department (HOSPITAL_BASED_OUTPATIENT_CLINIC_OR_DEPARTMENT_OTHER)
Admission: EM | Admit: 2011-05-23 | Discharge: 2011-05-23 | Disposition: A | Discharge status: Other Acute Inpatient Hospital | Payer: BC Managed Care – PPO | Source: Home / Self Care | Attending: Emergency Medicine | Admitting: Emergency Medicine

## 2011-05-23 ENCOUNTER — Emergency Department (INDEPENDENT_AMBULATORY_CARE_PROVIDER_SITE_OTHER): Payer: BC Managed Care – PPO

## 2011-05-23 ENCOUNTER — Encounter: Payer: Self-pay | Admitting: Family Medicine

## 2011-05-23 DIAGNOSIS — K219 Gastro-esophageal reflux disease without esophagitis: Secondary | ICD-10-CM | POA: Insufficient documentation

## 2011-05-23 DIAGNOSIS — R110 Nausea: Secondary | ICD-10-CM | POA: Insufficient documentation

## 2011-05-23 DIAGNOSIS — I10 Essential (primary) hypertension: Secondary | ICD-10-CM | POA: Diagnosis present

## 2011-05-23 DIAGNOSIS — Z91199 Patient's noncompliance with other medical treatment and regimen due to unspecified reason: Secondary | ICD-10-CM

## 2011-05-23 DIAGNOSIS — R7989 Other specified abnormal findings of blood chemistry: Secondary | ICD-10-CM | POA: Diagnosis present

## 2011-05-23 DIAGNOSIS — K8021 Calculus of gallbladder without cholecystitis with obstruction: Secondary | ICD-10-CM | POA: Insufficient documentation

## 2011-05-23 DIAGNOSIS — Z7982 Long term (current) use of aspirin: Secondary | ICD-10-CM

## 2011-05-23 DIAGNOSIS — K8020 Calculus of gallbladder without cholecystitis without obstruction: Secondary | ICD-10-CM

## 2011-05-23 DIAGNOSIS — R52 Pain, unspecified: Secondary | ICD-10-CM

## 2011-05-23 DIAGNOSIS — M810 Age-related osteoporosis without current pathological fracture: Secondary | ICD-10-CM | POA: Diagnosis present

## 2011-05-23 DIAGNOSIS — K859 Acute pancreatitis without necrosis or infection, unspecified: Secondary | ICD-10-CM | POA: Diagnosis present

## 2011-05-23 DIAGNOSIS — R1011 Right upper quadrant pain: Secondary | ICD-10-CM | POA: Insufficient documentation

## 2011-05-23 DIAGNOSIS — K8066 Calculus of gallbladder and bile duct with acute and chronic cholecystitis without obstruction: Principal | ICD-10-CM | POA: Diagnosis present

## 2011-05-23 DIAGNOSIS — E7800 Pure hypercholesterolemia, unspecified: Secondary | ICD-10-CM | POA: Insufficient documentation

## 2011-05-23 DIAGNOSIS — Z860100 Personal history of colon polyps, unspecified: Secondary | ICD-10-CM

## 2011-05-23 DIAGNOSIS — Z79899 Other long term (current) drug therapy: Secondary | ICD-10-CM

## 2011-05-23 DIAGNOSIS — Z9119 Patient's noncompliance with other medical treatment and regimen: Secondary | ICD-10-CM

## 2011-05-23 DIAGNOSIS — E785 Hyperlipidemia, unspecified: Secondary | ICD-10-CM | POA: Diagnosis present

## 2011-05-23 HISTORY — DX: Essential (primary) hypertension: I10

## 2011-05-23 HISTORY — DX: Pure hypercholesterolemia, unspecified: E78.00

## 2011-05-23 LAB — URINALYSIS, ROUTINE W REFLEX MICROSCOPIC
Ketones, ur: NEGATIVE mg/dL
Leukocytes, UA: NEGATIVE
Nitrite: NEGATIVE
Protein, ur: NEGATIVE mg/dL
Urobilinogen, UA: 1 mg/dL (ref 0.0–1.0)
pH: 7.5 (ref 5.0–8.0)

## 2011-05-23 LAB — DIFFERENTIAL
Basophils Relative: 1 % (ref 0–1)
Lymphs Abs: 0.8 10*3/uL (ref 0.7–4.0)
Monocytes Absolute: 0.6 10*3/uL (ref 0.1–1.0)
Monocytes Relative: 9 % (ref 3–12)
Neutro Abs: 4.4 10*3/uL (ref 1.7–7.7)

## 2011-05-23 LAB — COMPREHENSIVE METABOLIC PANEL
Albumin: 4.1 g/dL (ref 3.5–5.2)
Alkaline Phosphatase: 146 U/L — ABNORMAL HIGH (ref 39–117)
BUN: 10 mg/dL (ref 6–23)
Chloride: 101 mEq/L (ref 96–112)
Creatinine, Ser: 0.7 mg/dL (ref 0.50–1.10)
GFR calc Af Amer: >60 mL/min (ref >60)
GFR calc non Af Amer: >60 mL/min (ref >60)
Glucose, Bld: 111 mg/dL — ABNORMAL HIGH (ref 70–99)
Potassium: 3.6 mEq/L (ref 3.5–5.1)
Total Bilirubin: 2.3 mg/dL — ABNORMAL HIGH (ref 0.3–1.2)

## 2011-05-23 LAB — LIPASE, BLOOD: Lipase: 31 U/L (ref 11–59)

## 2011-05-23 LAB — CBC
HCT: 38.1 % (ref 36.0–46.0)
Hemoglobin: 13.1 g/dL (ref 12.0–15.0)
MCH: 31.3 pg (ref 26.0–34.0)
MCHC: 34.4 g/dL (ref 30.0–36.0)
RBC: 4.18 MIL/uL (ref 3.87–5.11)

## 2011-05-23 MED ORDER — NICOTINE 21 MG/24HR TD PT24
21.0000 mg | MEDICATED_PATCH | Freq: Once | TRANSDERMAL | Status: DC
  Filled 2011-05-23: qty 1

## 2011-05-23 MED ORDER — ONDANSETRON HCL 4 MG/2ML IJ SOLN
4.0000 mg | Freq: Once | INTRAMUSCULAR | Status: AC
  Administered 2011-05-23: 4 mg via INTRAVENOUS
  Filled 2011-05-23: qty 2

## 2011-05-23 MED ORDER — MORPHINE SULFATE 2 MG/ML IJ SOLN
INTRAMUSCULAR | Status: AC
  Filled 2011-05-23: qty 1

## 2011-05-23 MED ORDER — NICOTINE 14 MG/24HR TD PT24
MEDICATED_PATCH | TRANSDERMAL | Status: AC
  Administered 2011-05-23: 14 mg via TRANSDERMAL
  Filled 2011-05-23: qty 1

## 2011-05-23 MED ORDER — MORPHINE SULFATE 4 MG/ML IJ SOLN
6.0000 mg | Freq: Once | INTRAMUSCULAR | Status: AC
  Administered 2011-05-23: 6 mg via INTRAVENOUS
  Filled 2011-05-23: qty 1

## 2011-05-23 NOTE — ED Notes (Signed)
Pt c/o RUQ pain radiating to right flank. Pt sts "I think I am having a gallbladder attack". Pt denies fever, n/v/d.

## 2011-05-23 NOTE — ED Notes (Signed)
IV saline lock intact left ac.

## 2011-05-23 NOTE — ED Provider Notes (Addendum)
History     CSN: [id number] Arrival date & time: 05/23/2011  3:30 PM  Chief Complaint  Patient presents with  . Abdominal Pain    (Consider location/radiation/quality/duration/timing/severity/associated sxs/prior treatment) Patient is a 60 y.o. female presenting with abdominal pain. The history is provided by the patient.  Abdominal Pain The primary symptoms of the illness include abdominal pain and nausea. The primary symptoms of the illness do not include fever, fatigue, shortness of breath, vomiting or diarrhea. Primary symptoms comment: RUQ, radiating to right upper back The current episode started yesterday. The onset of the illness was sudden (intermittent episodes). The problem has been gradually worsening.  The illness is associated with eating. The patient states that she believes she is currently not pregnant (post menopausal). The patient has not had a change in bowel habit. Additional symptoms associated with the illness include heartburn. Symptoms associated with the illness do not include chills, diaphoresis, hematuria, frequency or back pain. Significant associated medical issues include GERD. Significant associated medical issues do not include inflammatory bowel disease or diabetes.    Past Medical History  Diagnosis Date  . Hypertension   . High cholesterol     History reviewed. No pertinent past surgical history.  No family history on file.  History  Substance Use Topics  . Smoking status: Current Everyday Smoker  . Smokeless tobacco: Not on file  . Alcohol Use: Yes    OB History    Grav Para Term Preterm Abortions TAB SAB Ect Mult Living                  Review of Systems  Constitutional: Negative for fever, chills, diaphoresis and fatigue.  HENT: Negative for congestion, rhinorrhea and sneezing.   Eyes: Negative.   Respiratory: Negative for cough, chest tightness and shortness of breath.   Cardiovascular: Negative for chest pain and leg swelling.    Gastrointestinal: Positive for heartburn, nausea and abdominal pain. Negative for vomiting, diarrhea and blood in stool.  Genitourinary: Negative for frequency, hematuria, flank pain and difficulty urinating.  Musculoskeletal: Negative for back pain and arthralgias.  Skin: Negative for rash.  Neurological: Negative for dizziness, speech difficulty, weakness, numbness and headaches.    Allergies  Codeine and Penicillins  Home Medications   Current Outpatient Rx  Name Route Sig Dispense Refill  . ALPRAZOLAM 0.25 MG PO TABS Oral Take 0.25 mg by mouth daily as needed. For anxiety     . ASPIRIN 81 MG PO TABS Oral Take 81 mg by mouth daily.      . ATORVASTATIN CALCIUM 80 MG PO TABS Oral Take 80 mg by mouth daily.      Marland Kitchen EZETIMIBE 10 MG PO TABS Oral Take 10 mg by mouth daily.      Marland Kitchen LISINOPRIL 20 MG PO TABS  TAKE 1 TABLET BY MOUTH ONCE DAILY 90 tablet 0  . RANITIDINE HCL 150 MG PO TABS Oral Take 150 mg by mouth 2 (two) times daily as needed. Acid reflux       BP 137/73  Pulse 82  Temp(Src) 98.1 F (36.7 C) (Oral)  Resp 18  Ht 5' 2.5" (1.588 m)  Wt 133 lb (60.328 kg)  BMI 23.94 kg/m2  SpO2 100%  Physical Exam  Constitutional: She is oriented to person, place, and time. She appears well-developed and well-nourished.  HENT:  Head: Normocephalic and atraumatic.  Eyes: Pupils are equal, round, and reactive to light.  Neck: Normal range of motion. Neck supple.  Cardiovascular: Normal rate,  regular rhythm and normal heart sounds.   Pulmonary/Chest: Effort normal and breath sounds normal. No respiratory distress. She has no wheezes. She has no rales. She exhibits no tenderness.  Abdominal: Soft. Bowel sounds are normal. There is tenderness. There is no rebound and no guarding.         Moderate tenderness to RUQ/epigastrium  Musculoskeletal: Normal range of motion. She exhibits no edema.  Lymphadenopathy:    She has no cervical adenopathy.  Neurological: She is alert and oriented to  person, place, and time.  Skin: Skin is warm and dry. No rash noted.  Psychiatric: She has a normal mood and affect.    ED Course  Procedures (including critical care time)   Results for orders placed during the hospital encounter of 05/23/11  CBC      Component Value Range   WBC 6.1  4.0 - 10.5 (K/uL)   RBC 4.18  3.87 - 5.11 (MIL/uL)   Hemoglobin 13.1  12.0 - 15.0 (g/dL)   HCT 40.9  81.1 - 91.4 (%)   MCV 91.1  78.0 - 100.0 (fL)   MCH 31.3  26.0 - 34.0 (pg)   MCHC 34.4  30.0 - 36.0 (g/dL)   RDW 78.2  95.6 - 21.3 (%)   Platelets 210  150 - 400 (K/uL)  DIFFERENTIAL      Component Value Range   Neutrophils Relative 73  43 - 77 (%)   Neutro Abs 4.4  1.7 - 7.7 (K/uL)   Lymphocytes Relative 14  12 - 46 (%)   Lymphs Abs 0.8  0.7 - 4.0 (K/uL)   Monocytes Relative 9  3 - 12 (%)   Monocytes Absolute 0.6  0.1 - 1.0 (K/uL)   Eosinophils Relative 3  0 - 5 (%)   Eosinophils Absolute 0.2  0.0 - 0.7 (K/uL)   Basophils Relative 1  0 - 1 (%)   Basophils Absolute 0.0  0.0 - 0.1 (K/uL)  COMPREHENSIVE METABOLIC PANEL      Component Value Range   Sodium 137  135 - 145 (mEq/L)   Potassium 3.6  3.5 - 5.1 (mEq/L)   Chloride 101  96 - 112 (mEq/L)   CO2 26  19 - 32 (mEq/L)   Glucose, Bld 111 (*) 70 - 99 (mg/dL)   BUN 10  6 - 23 (mg/dL)   Creatinine, Ser 0.86  0.50 - 1.10 (mg/dL)   Calcium 9.6  8.4 - 57.8 (mg/dL)   Total Protein 6.8  6.0 - 8.3 (g/dL)   Albumin 4.1  3.5 - 5.2 (g/dL)   AST 4696 (*) 0 - 37 (U/L)   ALT 816 (*) 0 - 35 (U/L)   Alkaline Phosphatase 146 (*) 39 - 117 (U/L)   Total Bilirubin 2.3 (*) 0.3 - 1.2 (mg/dL)   GFR calc non Af Amer >60  >60 (mL/min)   GFR calc Af Amer >60  >60 (mL/min)  LIPASE, BLOOD      Component Value Range   Lipase 31  11 - 59 (U/L)  URINALYSIS, ROUTINE W REFLEX MICROSCOPIC      Component Value Range   Color, Urine AMBER (*) YELLOW    Appearance CLEAR  CLEAR    Specific Gravity, Urine 1.014  1.005 - 1.030    pH 7.5  5.0 - 8.0    Glucose, UA NEGATIVE   NEGATIVE (mg/dL)   Hgb urine dipstick NEGATIVE  NEGATIVE    Bilirubin Urine SMALL (*) NEGATIVE    Ketones, ur NEGATIVE  NEGATIVE (mg/dL)   Protein, ur NEGATIVE  NEGATIVE (mg/dL)   Urobilinogen, UA 1.0  0.0 - 1.0 (mg/dL)   Nitrite NEGATIVE  NEGATIVE    Leukocytes, UA NEGATIVE  NEGATIVE    US Abdomen Complete  05/23/2011  *RADIOLOGY REPORT*  Clinical Data:  Intermittent right upper quadrant pain  COMPLETE ABDOMINAL ULTRASOUND  Comparison:  None.  Findings:  Gallbladder:  Multiple layering gallstones.  Gallbladder is notable for a mildly thickened wall, measuring 6 mm, but is contracted. Negative sonographic Murphy's sign.  Common bile duct:  Measures 6 mm.  Liver:  No focal lesion identified.  Within normal limits in parenchymal echogenicity.  Mildly dilated left hepatic ducts.  IVC:  Appears normal.  Pancreas:  Incompletely visualized but grossly unremarkable.  Spleen:  Measures 10.2 cm.  Right Kidney:  Measures 11.2 cm.  Extrarenal pelvis.  No mass or hydronephrosis.  Left Kidney:  Measures 11.9 cm.  No mass or hydronephrosis.  Abdominal aorta:  No aneurysm identified.  IMPRESSION: Cholelithiasis, without associated findings to suggest acute cholecystitis.   Common bile duct measures 6 mm.  Mild left intrahepatic ductal dilatation.  Correlate with LFTs and consider enhanced MRI with MRCP for further evaluation on a nonemergent basis.  Original Report Authenticated By: Charline Bills, M.D.       MDM  Cholecystitis vs cholelithiasis vs PUD vs pancreatitis vs hepatitis   U/s with elevated LFTs suggests CBD stone, discussed with GI on call, Dr. Hyacinth Meeker who suggest pt be transferred to [facility], admitted by hospitalist.  Spoke with hospitalist, Dr. Onalee Hua who has accepted pt for transfer.  Clinical impression: cholelithiasis with obstruction   Rolan Bucco, MD 05/23/11 1811   Pt has room at [facility].  [facility] unavailable.  Was going to call [facility] for transport, but pt is requesting to go POV.   Is going to be driven by her sister.  Is on no ongoing meds/drips.  Will saline lock IV.  Pt agrees to go directly to [facility], no stopping, no eating or drinking.  She seems to be responsible/reliable.  Have agreed to let her go POV.  Nicotine patch was d/c'd prior to transfer.  Rolan Bucco, MD 05/23/11 [phone]

## 2011-05-23 NOTE — ED Notes (Signed)
Nicotine 14mg  patch removed from pts right arm prior to discharge to [facility] via private vehicle.

## 2011-05-23 NOTE — ED Notes (Signed)
Pt will be going to [facility] via private vehicle with IV saline lock when room is available.

## 2011-05-24 ENCOUNTER — Inpatient Hospital Stay (HOSPITAL_COMMUNITY): Payer: BC Managed Care – PPO

## 2011-05-24 LAB — DIFFERENTIAL
Basophils Absolute: 0 10*3/uL (ref 0.0–0.1)
Basophils Relative: 1 % (ref 0–1)
Eosinophils Absolute: 0.2 10*3/uL (ref 0.0–0.7)
Eosinophils Relative: 5 % (ref 0–5)
Monocytes Absolute: 0.4 10*3/uL (ref 0.1–1.0)
Monocytes Relative: 10 % (ref 3–12)
Neutro Abs: 2.6 10*3/uL (ref 1.7–7.7)

## 2011-05-24 LAB — TYPE AND SCREEN
ABO/RH(D): A POS
Antibody Screen: NEGATIVE

## 2011-05-24 LAB — COMPREHENSIVE METABOLIC PANEL
ALT: 672 U/L — ABNORMAL HIGH (ref 0–35)
Albumin: 3.2 g/dL — ABNORMAL LOW (ref 3.5–5.2)
BUN: 8 mg/dL (ref 6–23)
Calcium: 8.5 mg/dL (ref 8.4–10.5)
GFR calc Af Amer: >60 mL/min (ref >60)
Glucose, Bld: 103 mg/dL — ABNORMAL HIGH (ref 70–99)
Potassium: 4.1 mEq/L (ref 3.5–5.1)
Sodium: 142 mEq/L (ref 135–145)
Total Protein: 5.7 g/dL — ABNORMAL LOW (ref 6.0–8.3)

## 2011-05-24 LAB — CBC
Hemoglobin: 11.3 g/dL — ABNORMAL LOW (ref 12.0–15.0)
MCH: 31 pg (ref 26.0–34.0)
MCHC: 33 g/dL (ref 30.0–36.0)
RDW: 12.4 % (ref 11.5–15.5)

## 2011-05-24 LAB — GLUCOSE, CAPILLARY: Glucose-Capillary: 104 mg/dL — ABNORMAL HIGH (ref 70–99)

## 2011-05-24 LAB — LIPASE, BLOOD: Lipase: 25 U/L (ref 11–59)

## 2011-05-25 ENCOUNTER — Other Ambulatory Visit (INDEPENDENT_AMBULATORY_CARE_PROVIDER_SITE_OTHER): Payer: Self-pay | Admitting: Surgery

## 2011-05-25 ENCOUNTER — Inpatient Hospital Stay (HOSPITAL_COMMUNITY): Payer: BC Managed Care – PPO

## 2011-05-25 DIAGNOSIS — K859 Acute pancreatitis without necrosis or infection, unspecified: Secondary | ICD-10-CM

## 2011-05-25 DIAGNOSIS — R945 Abnormal results of liver function studies: Secondary | ICD-10-CM

## 2011-05-25 DIAGNOSIS — K8010 Calculus of gallbladder with chronic cholecystitis without obstruction: Secondary | ICD-10-CM

## 2011-05-25 DIAGNOSIS — K8020 Calculus of gallbladder without cholecystitis without obstruction: Secondary | ICD-10-CM

## 2011-05-25 LAB — CBC
HCT: 33.3 % — ABNORMAL LOW (ref 36.0–46.0)
MCHC: 33.9 g/dL (ref 30.0–36.0)
MCV: 93.5 fL (ref 78.0–100.0)
Platelets: 163 10*3/uL (ref 150–400)
RDW: 12.3 % (ref 11.5–15.5)

## 2011-05-25 LAB — COMPREHENSIVE METABOLIC PANEL
Albumin: 2.9 g/dL — ABNORMAL LOW (ref 3.5–5.2)
BUN: 6 mg/dL (ref 6–23)
Calcium: 8.2 mg/dL — ABNORMAL LOW (ref 8.4–10.5)
Creatinine, Ser: 0.62 mg/dL (ref 0.50–1.10)
Total Protein: 5.4 g/dL — ABNORMAL LOW (ref 6.0–8.3)

## 2011-05-25 LAB — LIPASE, BLOOD: Lipase: 40 U/L (ref 11–59)

## 2011-05-26 LAB — BASIC METABOLIC PANEL
Chloride: 108 mEq/L (ref 96–112)
Creatinine, Ser: 0.71 mg/dL (ref 0.50–1.10)
GFR calc Af Amer: >90 mL/min (ref >90)
Potassium: 3.6 mEq/L (ref 3.5–5.1)
Sodium: 135 mEq/L (ref 135–145)

## 2011-05-26 LAB — HEPATIC FUNCTION PANEL
Bilirubin, Direct: 0.2 mg/dL (ref 0.0–0.3)
Indirect Bilirubin: 0.4 mg/dL (ref 0.3–0.9)

## 2011-05-26 LAB — CBC
MCH: 31.4 pg (ref 26.0–34.0)
MCHC: 33.6 g/dL (ref 30.0–36.0)
MCV: 93.3 fL (ref 78.0–100.0)
Platelets: 156 10*3/uL (ref 150–400)
RDW: 12.3 % (ref 11.5–15.5)

## 2011-05-27 NOTE — H&P (Addendum)
NAME:  [name] [name], [name] [name]       ACCOUNT NO.:  [id number]  MEDICAL RECORD NO.:  [id number]  LOCATION:  [facility]                          FACILITY:  [facility]  PHYSICIAN:  Tarry Kos, MD       DATE OF BIRTH:  1950/11/28  DATE OF ADMISSION:  05/23/2011 DATE OF DISCHARGE:  05/23/2011                             HISTORY & PHYSICAL   CHIEF COMPLAINT:  Right upper quadrant abdominal pain.  HISTORY OF PRESENT ILLNESS:  [name] is a 60 year old female with a history of hypertension and hyperlipidemia, who [facility] with right upper quadrant pain.  She states she has been actually having this kind of pain that has been going on and off for 20 years, but really has never sought medical attention to it.  Had started some PPIs over the counter, felt that was the problem and then the pain got so severe over the last several days with nausea and vomiting and she went to the ED.  She was found to have elevated liver enzymes and a significant gallstones in her gallbladder.  She was transferred here for further evaluation with ERCP by GI.  She is feeling much better now.  Her right upper quadrant pain was resolved.  She, of course has been NPO all day.  She denies running any fevers.  Denies any rashes. Denies any blood in her vomit.  REVIEW OF SYSTEMS:  Otherwise negative.  PAST MEDICAL HISTORY: 1. Hypertension. 2. Hyperlipidemia. 3. Osteoporosis.  SOCIAL HISTORY:  She smokes.  She does drink 3 to 4 days a week, drinks 2 to 3 glasses of wine a day when she does drink.  Denies any other drugs.  Divorced.  MEDICATIONS:  She takes lisinopril, statin medication for osteoporosis, and a baby aspirin a day.  ALLERGIES: 1. PENICILLIN causes a rash. 2. CODEINE causes vomiting.  PHYSICAL EXAMINATION:  VITAL SIGNS:  Temperature 98.1, pulse 74, respirations 18, blood pressure 118/68. GENERAL:  She is alert and oriented x4.  No apparent stress.  Calm  and friendly. HEENT:  Extraocular muscles intact.  Pupils equal and reactive to light. Oropharynx is clear.  Mucous membranes are moist. NECK:  No JVD.  No carotid bruits. COR:  Regular rate and rhythm without murmurs, rubs, or gallops. CHEST:  Clear to auscultation bilaterally.  No wheezes or rales. ABDOMEN:  Soft.  It is nondistended.  It is tender to palpation in the right upper quadrant.  There is no rebound, no guarding. EXTREMITIES:  No clubbing, cyanosis, or edema. PSYCH:  Normal affect. NEURO:  No focal neurologic deficits. SKIN:  No rashes.  LABORATORY DATA:  Her electrolytes are normal.  Her LFTs are elevated. ALT is 816, AST is 1205, alk phos is 146, total bili is 2.3.  Her lipase is normal.  Her white count is normal.  Hemoglobin is normal. Urinalysis, small amount of bilirubin.  Ultrasound:  Mild left intrahepatic ductal dilation, cholelithiasis.  ASSESSMENT AND PLAN:  A 60 year old female with right upper quadrant pain and cholelithiasis. 1. Right upper quadrant pain with cholelithiasis likely passing common     bile duct stone.  GI has already been made aware of the patient for  possible ERCP in the morning.  We will give her ice chips until     midnight and place her NPO after midnight.  The patient will     eventually need surgical evaluation to probably get her gallbladder     out, provided with aggressive IV fluid hydration overnight. 2. Hypertension is stable. 3. Hyperlipidemia, stable. 4. Tobacco abuse.  Encouraged to quit smoking. 5. The patient is full code.  Further recommendations depending on     over hospital course.          ______________________________ Tarry Kos, MD     RD/MEDQ  D:  05/23/2011  T:  05/24/2011  Job:  [id number]  Electronically Signed by Tarry Kos MD on 06/04/2011 07:01:59 PM

## 2011-06-02 ENCOUNTER — Other Ambulatory Visit: Payer: Self-pay | Admitting: Internal Medicine

## 2011-06-02 NOTE — Telephone Encounter (Signed)
30 day only Done; OV required before future refills.

## 2011-06-04 ENCOUNTER — Other Ambulatory Visit: Payer: Self-pay | Admitting: Internal Medicine

## 2011-06-06 NOTE — Telephone Encounter (Signed)
15-day supply given. Last OV 03/01/10.

## 2011-06-09 NOTE — Consult Note (Signed)
NAME:  [name] [name], [name] [name]       ACCOUNT NO.:  [id number]  MEDICAL RECORD NO.:  [id number]  LOCATION:  1537                         FACILITY:  [facility]  PHYSICIAN:  Shirley Friar, MDDATE OF BIRTH:  07-15-1951  DATE OF CONSULTATION: DATE OF DISCHARGE:                                CONSULTATION   INDICATION:  Elevated liver enzymes, gallstones, right upper quadrant abdominal pain.  HISTORY OF PRESENT ILLNESS:  Ms. Croston is a pleasant 60 year old white female who started having severe right upper quadrant abdominal pain 2 days ago that was intermittent, but when it would come on, it would be sharp and intense and radiate into her back.  She was doubled over in pain and the pain was postprandial on some occasions where it would occur within 30 minutes of eating.  She states that she has had pain similar to this off and on for 20 years that would go away with TPIs, but at this time it did not improve.  She was sent to the emergency room at [facility] and an ultrasound showed multiple gallstones and mildly dilated left intrahepatic ducts.  The common bile duct was not dilated and was measured at 6-mm.  There was no evidence of cholecystitis, but as stated above,  initial cholelithiasis.  Her liver function tests were markedly elevated with a total bilirubin of 2.3, ALP 146, AST 1205, ALT 816, and lipase of 31.  Her LFTs today show a T-bili down at 1.1, ALP 139, AST was 755, ALT 672.  She feels that her pain is better this morning.  She reports some minimal nausea with the pain, but denies any vomiting.  PAST MEDICAL HISTORY: 1. Hypertension. 2. Hyperlipidemia. 3. Osteoporosis.  MEDICATIONS:  Baby aspirin, lisinopril (home medicines).  ALLERGIES: 1. PENICILLIN. 2. CODEINE.  FAMILY HISTORY:  Noncontributory.  SOCIAL HISTORY:  Positive tobacco, positive alcohol (drinks 2 to 3 glasses of wine several days per week).  REVIEW OF SYSTEMS:  Negative from GI  standpoint except as stated above.  PHYSICAL EXAM:  VITAL SIGNS:  Temperature 98.3, pulse 74, blood pressure 99/64, O2 sats 97% on room air. GENERAL:  Alert, well-nourished, in no acute distress. ABDOMEN:  Right upper quadrant tenderness with minimal guarding. Otherwise nontender, soft, nondistended, positive bowel sounds.  LABS:  White blood count 4.1, hemoglobin 11.3, platelet count 186, T- bili 1.1 down from 2.3, ALP 139 down from 146, AST 755 down from 1205, ALT 672 down from 816, lipase 25.  IMPRESSION:  A 60 year old white female with elevated liver function tests likely due to common bile duct stones.  She needs an ERCP to assess and treat possible common bile duct stones.  Recommend ERCP done preoperatively.  She will need a surgical consult during this hospitalization to decide the timing of cholecystectomy.  Risks and benefits of ERCP were discussed with the patient and she agrees to proceed.  She will be continued n.p.o. and given antibiotics on-call to endoscopy.  Will type and screen.  Thank you for this consultation.     Shirley Friar, MD     VCS/MEDQ  D:  05/24/2011  T:  05/24/2011  Job:  [id number]  cc:   Graylin Shiver, M.D. Fax: [phone]  Electronically Signed by Charlott Rakes MD on 06/09/2011 07:54:35 PM

## 2011-06-11 NOTE — Discharge Summary (Signed)
NAME:  [name] [name], BRAHMBHATT       ACCOUNT NO.:  [id number]  MEDICAL RECORD NO.:  [id number]  LOCATION:  1537                         FACILITY:  [facility]  PHYSICIAN:  Everlie Eble I Nehal Shives, MD      DATE OF BIRTH:  13-Aug-1951  DATE OF ADMISSION:  05/23/2011 DATE OF DISCHARGE:  05/26/2011                              DISCHARGE SUMMARY   DISCHARGE DIAGNOSES: 1. Right upper quadrant abdominal pain felt to be secondary to common     bile duct stone, transient. 2. Abnormal liver function tests secondary to right upper quadrant     abdominal pain. 3. Gallstone, status post laparoscopic cholecystectomy. 4. History of hypertension. 5. History of hyperlipidemia. 6. Osteoporosis.  CONSULTATION:  GI consulted and Surgical consulted.  PROCEDURE: 1. Ultrasound of the abdomen, which showed cholelithiasis with     associated findings of just acute cholecystitis.  Common bile duct     measured 6 mm.  Mild left intrahepatic ductal dilatation.    Correlation with LFTs and concern, has MRI with MRCP for further     evaluation. 2. Chest x-ray.  No acute infiltrate or edema.  Mild infrahilar     increased bronchial marking without focal consolidation. 3. Status post laparoscopic cholecystectomy with cholangiogram.  Mild     intra and extra hepatic biliary ductal dilation.  Negative     intraoperative cholangiogram, cholelithiasis in the gallbladder.  HISTORY OF PRESENT ILLNESS:  This is a 60 year old female who has struggled with intermittent postprandial abdominal pain for several years if not decade.  She had some mild reflux disease.  She had a history of colon polyps with colonoscopy showing polyps about 5 years ago.  She had intense and frequent attacks and more postprandial trigger, admitted, found to have elevated LFTs.  She had an ERCP, which showed only some sludge, but no definitive stone.  The anatomy and physiology of hepatobiliary and pancreatic function was discussed. Accordingly, GI  consulted and the patient underwent ERCP and ERCP did show no large filling defect noted on the common bile duct, intrahepatic field, but biliary looks little inflamed and the question if the stone has already passed, is splenectomy done.  Duct is 5 and 12 mm.  The patient was intermittently uncooperative during the procedure.  She would walk and thrash around, also the patient was noncompliant during her hospital stay.  Surgical team consulted and the patient underwent laparoscopic cholecystectomy from the surgical team.  The patient is ready for discharge.  The patient's LFTs on the date of admission alkaline phosphatase 146, AST of 1205, ALT of 816, and alkaline phosphatase 139, AST 755, ALT 672.  The other LFTs did show alkaline phosphatase 103 and AST 126, ALT 295.  Accordingly, her LFTs improved. The patient needs to follow up with [facility] after 2 weeks.  The patient needs to stop smoking.  Blood pressure remained stable.  As I mentioned, the patient was noncompliant with her medical care and accordingly the patient will be discharged to follow up with her MD. The patient gave the staff here some problem with compliance.     Joffre Lucks Bosie Helper, MD     HIE/MEDQ  D:  05/26/2011  T:  05/26/2011  Job:  [id number]  Electronically Signed by Ebony Cargo MD on 06/11/2011 02:26:35 PM

## 2011-06-12 NOTE — Op Note (Signed)
NAME:  [name] [name], [name] [name]       ACCOUNT NO.:  [id number]  MEDICAL RECORD NO.:  [id number]  LOCATION:                                 FACILITY:  PHYSICIAN:  Ardeth Sportsman, MD     DATE OF BIRTH:  Feb 18, 1951  DATE OF PROCEDURE:  05/25/2011 DATE OF DISCHARGE:                              OPERATIVE REPORT   PRIMARY CARE PHYSICIAN:  [name], MD, at [place], [facility].  GASTROENTEROLOGIST:  Graylin Shiver, MD, [facility].  CARDIOLOGIST:  Rollene Rotunda, MD, FACC  SURGEON:  Ardeth Sportsman, MD  ASSISTANT:  Velora Heckler, MD  PREOPERATIVE DIAGNOSES: 1. Gallstone pancreatitis with resolved transient choledocholithiasis. 2. Probable acute on chronic cholecystitis.  POSTOPERATIVE DIAGNOSES: 1. Gallstone pancreatitis with resolved transient choledocholithiasis. 2. Probable acute-on-chronic cholecystitis.  PROCEDURE PERFORMED:  Laparoscopic cholecystectomy with intraoperative cholangiogram.  ANESTHESIA: 1. General anesthesia. 2. Local anesthetic in a field block around all port sites.  SPECIMENS:  Gallbladder.  DRAINS:  None.  ESTIMATED BLOOD LOSS:  Minimal.  COMPLICATIONS:  None apparent.  INDICATIONS:  [name] is a 60 year old female who struggled with intermittent postprandial abdominal pains for several years if not decades.  She has some mild reflux disease, it is stable.  She has a history of colon polyps with a colonoscopy showing polyps about 5 years ago.  She has had intense and frequency attacks and more postprandial triggers.  She was admitted and found to have elevated liver function tests.  She had an ERCP, which showed only some sludge, but no definite stones.  The anatomy and physiology of hepatobiliary and pancreatic function was discussed.  Pathophysiology of gallstones with its natural history, risk of  cholecystitis and gallstone pancreatitis were discussed. Options discussed.  Recommendation made for  cholecystectomy. Cholangiogram was discussed.  Risks, benefits, and alternatives were discussed in detail.  I re-explained it numerous times.  I had my partner, Dr. Darnell Level talk to her as well.  After some deliberation, she and her family agreed to proceed.  OPERATIVE FINDINGS:  She had chronic gallbladder wall thickening as well as some acute edema consistent with acute-on-chronic cholecystitis.  I could palpate some granular stones within it as well.  She had some mild adhesions to the gallbladder and especially the liver bed consistent with chronic cholecystitis as well.  Her liver, duodenal sweep, and other organs were otherwise normal.  Her cholangiogram noted a diluted biliary system, but no obstructing mass, tumor, or abnormalities.  The anatomy was rather classic.  DESCRIPTION OF PROCEDURE:  Informed consent was confirmed.  The patient was continued on her scheduled ciprofloxacin, which was timed actually at the time of surgery given her penicillin allergies.  She underwent general anesthesia without any difficulty.  She had sequential compression devices active during the entire case.  She was positioned supine with arms tucked.  Her abdomen was prepped and draped in a sterile fashion.  Surgical time-out confirmed our plan.  I placed a #5 mm port in the right upper quadrant using optical entry technique with the patient in steep reverse Trendelenburg and right side up.  Entry was clean.  I induced carbon dioxide insufflation.  Camera inspection revealed no injury.  I saw no adhesions  to the anterior abdominal wall.  Under direct visualization, I placed a 5 mm port through the umbilicus.  Another 5 mm port was placed in the right anterior axillary line near the subcostal ridge.  A final 10 mm port was placed in the subxiphoid region hidden within the falciform ligament.  We grabbed the dome of the gallbladder and elevated it cephalad.  It was rather thick walled, but was  ultimately able to lift and elevate it. The gallbladder was rather distended, but not completely turgid.  I freed the peritoneum between the gallbladder and the liver around the posterolateral wall of the gallbladder and the anteromedial wall of the gallbladder using hook cautery.  I did blunt dissection and isolated the anterior and posterior branches of the cystic artery.  I circumferentially dilated to get a good 360-degree view and get a good critical view and ligated them high on the gallbladder side.  I did further skeletonization to free the proximal half of the gallbladder off the liver bed to get a good critical view behind the gallbladder.  I could see the infundibulum and skeletonized it and the cystic duct several cm.  I placed a clip on the infundibulum.  I did a partial cystic ductotomy.  I melt back some sludge and a few small granular stones in the cyst duct.  I took a #5 [place] and placed through a subcostal puncture site into the cyst duct.  I ran a cholangiogram using dilute radio-opaque contrast and continuous fluoroscopy.  Contrast flowed from a helical side branch and refluxed up the common hepatic duct into the right and left intrahepatic chains.  It flowed down the common bile duct across  normal ampulla into the duodenum.  The common and hepatic bile ducts were moderately dilated, but there were no filling defects and no evidence of any sludge or stones within it.  Flow was good.  I did not get any reflux up the pancreatic duct that I could obviously tell.  I removed the cholangiogram catheter.  I placed 4 clips on the cystic duct just proximal to cystic ductotomy and completed cystic duct transection.  We freed the gallbladder from the main attachments on the liver bed using a cautery.  I placed an EndoCatch bag and removed it out the subxiphoid port.  I did have to open up the fascia about 1-1/2 cm to get the gallbladder out because it was  rather thick walled.  I returned the ports back in.  I did copious irrigation.  I ensured hemostasis on the gallbladder fossa.  Hemostasis was excellent.  Clips were intact on the cyst duct.  There was no evidence of injury or other abnormalities.  Liver, colon, duodenal bulb, and small intestine otherwise looked normal.  I evacuated carbon-dioxide and removed the ports.  I reapproximated the subxiphoid fascia using 0 Vicryl interrupted transverse stitches to the anterior fascia.  I closed the skin using 4-0 Monocryl stitch.  Sterile dressing was applied.  The patient is being extubated to go to recovery room.  I had discussed postoperative care in detail with the patient numerous times.  I am about to discuss with the family again.  Instructions are written.     Ardeth Sportsman, MD     SCG/MEDQ  D:  05/25/2011  T:  05/25/2011  Job:  [id number]  cc:   [name], MD [phone] [place], [place] [address]  [facility]  Electronically Signed by Karie Soda MD on 06/12/2011  11:08:41 AM

## 2011-06-12 NOTE — Consult Note (Signed)
NAMEMERLY, HINKSON NO.:  [id number]  MEDICAL RECORD NO.:  [id number]  LOCATION:  1537                         FACILITY:  [facility]  PHYSICIAN:  Ardeth Sportsman, MD     DATE OF BIRTH:  12/19/1950  DATE OF CONSULTATION:  05/25/2011 DATE OF DISCHARGE:                                CONSULTATION   PRIMARY CARE PHYSICIAN:  [name], MD,  [place], [place]  GASTROENTEROLOGIST:  Graylin Shiver, MD, also [facility].  CARDIOLOGIST:  Rollene Rotunda, MD, FACC  SURGEON:  Ardeth Sportsman, MD, of [facility].  REASON FOR VISIT:  Gallstone, probable chronic cholecystitis with possible transient choledocholithiasis.  HISTORY OF PRESENT ILLNESS:  [name] [name] is a 60 year old female, smoker, otherwise rather healthy and a regular exerciser, who has been struggling with intermittent abdominal pains for several decades.  She describes them as usually after going out to eat or having a heavier rich meal, having upper abdominal pain, especially right-sided.  She often gets loose bowel movements with it.  She normally has 1 or 2 loose bowel movements a day.  She has a history of colon polyps on a colonoscopy at [facility] 5 years ago, but no other major issues.  No history of hematochezia or melena.  No sick contacts or travel history. No history of irritable bowel syndrome or inflammatory bowel disease. No dietary or dairy allergies that she can recall.  Her most recent echo has made her come to the emergency room.  She had an ultrasound showing gallstones with pain and discomfort in that region.  She had elevated liver function tests.  Gastrology was evaluated.  They were concerned of choledocholithiasis.  She had an ERCP done yesterday, which did not show any definite stones.  She had some inflammation of her papilla suspicious for passing of stones, but no major stones there.  They requested surgical evaluation for  probable cholecystectomy.  The patient does have some heartburn reflux, but it is mild and usually well-controlled with Zantac.  This did not seem like that.  No history of urinary tract infections.  She will have a few glasses of wine a couple of times a week, but no history of binging or other issues.  No history of heavy nonsteroidal use.  No history of ulcers.  No history of hiatal hernia.  PAST MEDICAL HISTORY: 1. Hypertension. 2. Hyperlipidemia. 3. Osteoarthritis. 4. Colon polyps status post colonoscopic polypectomy about 5 years     ago.  PAST SURGICAL HISTORY:  Tubal ligation and colonoscopy as above.  SOCIAL HISTORY:  She smokes about a pack a day.  Alcohol intake as noted above with a few glasses of wine a few times a week.  Divorced.  No history of any other drug use.  ALLERGIES: 1. PENICILLIN. 2. CODEINE.  MEDICATIONS:  Include aspirin and lisinopril as well as Zetia, Boniva, Zantac, Lipitor.  REVIEW OF SYSTEMS:  As noted per HPI.  GENERAL:  No fevers, chills, or sweats.  Her weight has been stable.  ENT:  Negative.  CARDIAC:  She exercises regularly with good exercise tolerance and can walk several miles without any difficulty.  No exertional chest pain.  RESPIRATORY: No recent cold, cough, or  flu.  No productive sputum.  No history of asthma or reactive airway disease.  GYN:  No vaginal bleeding or discharge.  GU:  No history of urinary tract infections or pyelonephritis or kidney stones or hematuria or polyuria.  GI:  As noted above.  No dysphagia to solids or liquids.  MUSCULOSKELETAL:  She has had some shoulder discomfort with these attacks, but no history of any major joint problems.  NEUROLOGIC, PSYCH, HEME, LYMPH, ALLERGIC, HEPATIC, RENAL, ENDOCRINE:  Otherwise negative.  PHYSICAL EXAM:  VITAL SIGNS:  Her temperature is 98.6, pulse 74, respirations 20, blood pressure 119/77, saturations 99% on room air. GENERAL:  She is a well-developed, well-nourished  female, in no acute distress, sitting up. EYES:  Pupils equal, round, reactive to light.  Extraocular movements are intact.  Her sclerae are nonicteric.  They are noninjected. PSYCH:  She is a curious and inquisitive, a little bit anxious, but consolable.  No evidence of any dementia, delirium, psychosis, or paranoia. NEUROLOGICAL:  Cranial nerves II through XII are intact.  No resting or intention tremors. LYMPH:  No head, neck, axillary, or groin lymphadenopathy. NECK:  Supple without any masses.  Trachea is midline. HEENT:  She is normocephalic.  Mucous membranes are moist.  Nasopharynx and oropharynx are clear. HEART:  Regular rate and rhythm.  No murmurs, gallops, or rubs. CHEST:  Clear to auscultation bilaterally.  No wheezes, rales, or rhonchi.  No pain to rib or sternal compression. BACK:  No pain on cervical, thoracolumbosacral spine.  No costophrenic angle tenderness. BREASTS:  No nipple discharge or obvious skin changes. ABDOMEN:  Soft, flat.  She has some mild right upper quadrant epigastric discomfort, but no definite Murphy sign.  No left upper quadrant pain. No umbilical hernia.  No infraumbilical pain. GENITAL:  No vaginal bleeding.  No inguinal hernias. RECTAL:  Deferred. MUSCULOSKELETAL:  Good range of motion at shoulders, elbows, wrists as well as hip, knees, ankles. EXTREMITIES:  No clubbing, cyanosis, or edema. SKIN:  No petechiae or purpura.  No other source of lesions.  LABORATORY VALUES:  Her total bilirubin was 2.3 on admission, but has come down into the normal range.  Her LFTs have come down as well.  Her alk phos was high as 146, now 123.  Lipase was normal.  White count was not elevated.  Her hemoglobin was 11.3.  Urinalysis was normal.  She has an ultrasound, which shows obvious gallstones.  Her gallbladder was mildly thickened at 6 mm, but contracted.  No sonographic Murphy sign at that time.  Common bile duct is 6 mm and particularly  dilated. Liver, IVC, pancreas, kidneys are otherwise normal.  No hydronephrosis.  ASSESSMENT AND PLAN:  A 60 year old female with classic story of intermittent postprandial abdominal pain consistent with biliary colic with transient elevated LFTs, but negative ERCP consistent with probable transient choledocholithiasis, but with persistent gallstones.  The anatomy, physiology of the hepatobiliary and pancreatic function was discussed.  Pathophysiology of gallstones and natural history and risk was discussed.  Options were discussed and recommendation made for laparoscopic cholecystectomy with intraoperative cholangiogram.  Risks, benefits, and alternatives were discussed.  I had spent extra time talking to her, gave her handouts.  She noted her family was frustrated and felt like that she really did not have gallstones.  I printed out the ultrasound report and showed it at the report that she did have stones.  I spent numerous times trying to explain the difference between stones in the gallbladder versus stones  in the common bile duct and how they can pass out of the gallbladder and the common bile duct and cause problems, and noted to have sometimes common bile duct stones can transiently pass before an ERCP catch them or that sludge and subtle stones that are not as easy to find.  Given the fact that her differential diagnosis otherwise seems unlikely and she already had a gastroenterology evaluation, it is skeptical there is another etiology.  She has had regular attacks.  I am hesitant to let her go home to set up for an elective cholecystectomy.  She wanted to think about things first.  We gave her some printed-out handouts as well to help clarify the issue.  She will think about things and let us know. Otherwise, I have tentatively set it up for later today.      Ardeth Sportsman, MD     SCG/MEDQ  D:  05/25/2011  T:  05/25/2011  Job:  [id number]  Electronically Signed by  Karie Soda MD on 06/12/2011 01:16:46 PM

## 2011-07-27 ENCOUNTER — Other Ambulatory Visit: Payer: Self-pay | Admitting: Internal Medicine

## 2011-07-28 NOTE — Telephone Encounter (Signed)
Last OV 03/01/10

## 2011-08-13 ENCOUNTER — Encounter: Payer: BC Managed Care – PPO | Admitting: Internal Medicine

## 2011-08-25 ENCOUNTER — Other Ambulatory Visit: Payer: Self-pay | Admitting: Internal Medicine

## 2011-08-25 NOTE — Telephone Encounter (Signed)
Please send #[phone] and 1 RF No further RF w/o OV

## 2011-09-05 ENCOUNTER — Ambulatory Visit (INDEPENDENT_AMBULATORY_CARE_PROVIDER_SITE_OTHER)
Admission: RE | Admit: 2011-09-05 | Discharge: 2011-09-05 | Disposition: A | Discharge status: Home / Self Care | Payer: BC Managed Care – PPO | Source: Ambulatory Visit | Attending: Internal Medicine | Admitting: Internal Medicine

## 2011-09-05 ENCOUNTER — Ambulatory Visit (INDEPENDENT_AMBULATORY_CARE_PROVIDER_SITE_OTHER): Payer: BC Managed Care – PPO | Admitting: Internal Medicine

## 2011-09-05 DIAGNOSIS — F17200 Nicotine dependence, unspecified, uncomplicated: Secondary | ICD-10-CM

## 2011-09-05 DIAGNOSIS — J449 Chronic obstructive pulmonary disease, unspecified: Secondary | ICD-10-CM

## 2011-09-05 DIAGNOSIS — K589 Irritable bowel syndrome without diarrhea: Secondary | ICD-10-CM | POA: Insufficient documentation

## 2011-09-05 DIAGNOSIS — Z0000 Encounter for general adult medical examination without abnormal findings: Secondary | ICD-10-CM

## 2011-09-05 DIAGNOSIS — R197 Diarrhea, unspecified: Secondary | ICD-10-CM

## 2011-09-05 LAB — COMPREHENSIVE METABOLIC PANEL
ALT: 37 U/L — ABNORMAL HIGH (ref 0–35)
Albumin: 4.6 g/dL (ref 3.5–5.2)
CO2: 26 mEq/L (ref 19–32)
Chloride: 107 mEq/L (ref 96–112)
GFR: 102.96 mL/min (ref >60.00)
Potassium: 4.9 mEq/L (ref 3.5–5.1)
Sodium: 143 mEq/L (ref 135–145)
Total Bilirubin: 0.5 mg/dL (ref 0.3–1.2)
Total Protein: 7.4 g/dL (ref 6.0–8.3)

## 2011-09-05 LAB — CBC WITH DIFFERENTIAL/PLATELET
Basophils Absolute: 0 10*3/uL (ref 0.0–0.1)
HCT: 39.7 % (ref 36.0–46.0)
Lymphs Abs: 1.6 10*3/uL (ref 0.7–4.0)
Monocytes Absolute: 0.6 10*3/uL (ref 0.1–1.0)
Monocytes Relative: 7.2 % (ref 3.0–12.0)
Platelets: 240 10*3/uL (ref 150.0–400.0)
RDW: 12.6 % (ref 11.5–14.6)

## 2011-09-05 LAB — LIPID PANEL
Cholesterol: 189 mg/dL (ref 0–200)
VLDL: 12.4 mg/dL (ref 0.0–40.0)

## 2011-09-05 MED ORDER — TIOTROPIUM BROMIDE MONOHYDRATE 18 MCG IN CAPS: 18.0000 ug | ORAL_CAPSULE | Freq: Every day | RESPIRATORY_TRACT | Status: DC

## 2011-09-05 MED ORDER — AZITHROMYCIN 250 MG PO TABS: ORAL_TABLET | ORAL | Status: AC

## 2011-09-05 NOTE — Assessment & Plan Note (Signed)
Patient started wheezing for the last few months, symptoms started after she had EGD while she was in the hospital with cholecystitis. Very anxious about it, does not like to be counseled about tobacco anymore. I explained to patient that I am here to help, that when/if she is ready for counseling she knows to call me. She is well aware of the risks

## 2011-09-05 NOTE — Assessment & Plan Note (Addendum)
Chronic diarrhea, reports that that symptom has never been investigated.  Refer to Dr. Yancey Flemings, per patient request , also she is due for colonoscopy. Addendum: Due to abnormal chest x-ray, will hold on GI workup

## 2011-09-05 NOTE — Progress Notes (Signed)
  Subjective:    Patient ID: [name] [name], female    DOB: 1951-06-20, 61 y.o.   MRN: [id number]  HPI CPX  Past Medical History: Hyperlipidemia Hypertension Osteopenia  Past Surgical History: cholecystectomy 04-2011 (had a obstruction) Tubal ligation  Social History: divorced, 2 kids, lives by herself Occupation: part time @ a [employer]  Alcohol 2-3 glasses of wine  on weekend Tobacco--current 1ppd  diet -- not as healthy as before   exercise-- twice a week    Review of Systems  Constitutional: Negative for fever and fatigue.  Respiratory:       Wheezing since she had a EGD ~04-2011, no cough, no sputum. No SOB, no DOE at the gym  Cardiovascular: Negative for chest pain and leg swelling.  Psychiatric/Behavioral:       Doing well emotionally , sees a counselor, in good terms w/ ex husband   Reports a long history of diarrhea usually postprandial. No blood in the stools, abdominal pain, nausea or vomiting. No GERD symptoms.      Objective:   Physical Exam  Constitutional: She is oriented to person, place, and time. She appears well-developed and well-nourished. No distress.  HENT:  Head: Normocephalic and atraumatic.  Neck: No thyromegaly present.  Cardiovascular: Normal rate, regular rhythm and normal heart sounds.   No murmur heard. Pulmonary/Chest:       Mild wheezing bilaterally, no rhonchi, crackles, increased work of breathing  Abdominal: Soft. Bowel sounds are normal. She exhibits no distension. There is no tenderness. There is no rebound and no guarding.  Musculoskeletal: She exhibits no edema.  Lymphadenopathy:    She has no cervical adenopathy.  Neurological: She is alert and oriented to person, place, and time.  Skin: She is not diaphoretic.  Psychiatric:       Slightly emotional       Assessment & Plan:

## 2011-09-05 NOTE — Assessment & Plan Note (Addendum)
Td shot 2008  Flu shot  sees gyn: they take care of osteoporosis-[facility]  colonoscopy in 2007 as requested by her gynecologist  (@ [place], 2 polyps , told to repeat in 5 years per patient)  continue healthy life style (except for tobacco)  on ASA

## 2011-09-05 NOTE — Patient Instructions (Signed)
spiriva  every day Prilosec over-the-counter one tablet before breakfast every day Zithromax for 5 days Get your x-ray   Come back in 2 months

## 2011-09-05 NOTE — Assessment & Plan Note (Signed)
Patient is a heavy smoker, no wheezing since September; symptoms started after she had a EGD at the hospital. Plan: PPIs, Zithromax (atypical infection?) Chest x-ray spiriva  Reassess in 2 months.

## 2011-09-08 ENCOUNTER — Telehealth: Payer: Self-pay | Admitting: Internal Medicine

## 2011-09-08 DIAGNOSIS — R9389 Abnormal findings on diagnostic imaging of other specified body structures: Secondary | ICD-10-CM

## 2011-09-08 NOTE — Telephone Encounter (Signed)
Abnormal chest x-ray, we are scheduling a CT with contrast. But were normal. Patient aware, I talked to her personally, she is   distressed and she knows to call me anytime if she needs me.

## 2011-09-09 ENCOUNTER — Encounter: Payer: Self-pay | Admitting: Internal Medicine

## 2011-09-09 ENCOUNTER — Ambulatory Visit (INDEPENDENT_AMBULATORY_CARE_PROVIDER_SITE_OTHER): Payer: BC Managed Care – PPO | Admitting: Internal Medicine

## 2011-09-09 ENCOUNTER — Telehealth: Payer: Self-pay | Admitting: *Deleted

## 2011-09-09 ENCOUNTER — Ambulatory Visit (INDEPENDENT_AMBULATORY_CARE_PROVIDER_SITE_OTHER)
Admission: RE | Admit: 2011-09-09 | Discharge: 2011-09-09 | Disposition: A | Discharge status: Home / Self Care | Payer: BC Managed Care – PPO | Source: Ambulatory Visit | Attending: Internal Medicine | Admitting: Internal Medicine

## 2011-09-09 DIAGNOSIS — R918 Other nonspecific abnormal finding of lung field: Secondary | ICD-10-CM

## 2011-09-09 DIAGNOSIS — R222 Localized swelling, mass and lump, trunk: Secondary | ICD-10-CM

## 2011-09-09 DIAGNOSIS — R9389 Abnormal findings on diagnostic imaging of other specified body structures: Secondary | ICD-10-CM | POA: Insufficient documentation

## 2011-09-09 MED ORDER — IOHEXOL 300 MG/ML  SOLN
80.0000 mL | Freq: Once | INTRAMUSCULAR | Status: AC | PRN
  Administered 2011-09-09: 80 mL via INTRAVENOUS

## 2011-09-09 NOTE — Telephone Encounter (Signed)
Please ask the patient to come to the office this afternoon

## 2011-09-09 NOTE — Progress Notes (Signed)
  Subjective:    Patient ID: [name] [name], female    DOB: 1951-02-21, 61 y.o.   MRN: [id number]  HPI Here with her husband   Review of Systems     Objective:   Physical Exam Alert, oriented x3, obviously anxious. Lungs are examined, she has mild expiratory wheezing, also some inspiratory congestion.       Assessment & Plan:

## 2011-09-09 NOTE — Assessment & Plan Note (Addendum)
Patient is here at my request, I told the patient that the CT is also abnormal and confirms the presence of a mass in the chest. She liked to know if it is malignant or not, I told the patient that is likely malignant  but that needs to be confirmed. I already referred her to thoracic surgery for further workup. I encouraged patient to call me if she needs me.

## 2011-09-09 NOTE — Telephone Encounter (Signed)
Rose called from CT to let you know results are in on patient and that she was "really anxious in their office" about receiving results. Thanks.

## 2011-09-09 NOTE — Telephone Encounter (Signed)
Pt will be here at 3:15pm to discuss results per Dr Drue Novel.

## 2011-09-10 ENCOUNTER — Telehealth: Payer: Self-pay | Admitting: Internal Medicine

## 2011-09-10 NOTE — Telephone Encounter (Signed)
Per Dr. Drue Novel okay to place orders.  Orders have been placed.

## 2011-09-10 NOTE — Telephone Encounter (Addendum)
Dr. Drue Novel, in reference to the order for MRI Brain w-w/o, I was unable to get this approved with patient's insurance.  [employer] will keep this open for only 3 business days, please call [phone], choose Option 2, for peer to peer, you will reference the patient's name, dob, and ID# [id number].  Fyi, patient is already scheduled for her PFTs on 09-11-11, and for the PET Scan on 09-22-11.  I'm sure she will not be happy with the date of the PET, but they are only done at [facility] & that was the first available.

## 2011-09-10 NOTE — Telephone Encounter (Signed)
Dr. Drue Novel, per phone call from [facility] at [facility], they will need the following tests ordered prior to scheduling patient for her initial consult with them, a PET SCAN, an MRI BRAIN with & without, and PFTs.  Per Alvis Lemmings, once these tests are ordered and scheduled, they will get patient in for an appointment with their office.

## 2011-09-11 ENCOUNTER — Ambulatory Visit (INDEPENDENT_AMBULATORY_CARE_PROVIDER_SITE_OTHER): Payer: BC Managed Care – PPO | Admitting: Internal Medicine

## 2011-09-11 ENCOUNTER — Telehealth: Payer: Self-pay | Admitting: Internal Medicine

## 2011-09-11 DIAGNOSIS — J449 Chronic obstructive pulmonary disease, unspecified: Secondary | ICD-10-CM

## 2011-09-11 LAB — PULMONARY FUNCTION TEST

## 2011-09-11 MED ORDER — ALPRAZOLAM 0.5 MG PO TBDP: 0.5000 mg | ORAL_TABLET | Freq: Four times a day (QID) | ORAL | Status: DC | PRN

## 2011-09-11 MED ORDER — CITALOPRAM HYDROBROMIDE 20 MG PO TABS: ORAL_TABLET | ORAL | Status: DC

## 2011-09-11 NOTE — Telephone Encounter (Signed)
Pt aware and states she understands the new medication instructions.  Pt states she is not having suicidal thoughts.  Pt states she is really shaky due to her circumstances.  Rx called in to [facility].

## 2011-09-11 NOTE — Telephone Encounter (Signed)
Spoke w/a MD: Authorization  number: [id number]

## 2011-09-11 NOTE — Telephone Encounter (Signed)
Dr. Drue Novel, I just spoke with patient giving her more appointment information, and she is so upset, she is crying off and on, she is shaking so bad, she can hardly write anything down that I'm telling her.  She also is awakening during the night.  She is at work like this.  Her GYN currently has her on 0.25 xanax, for a different dx, however, for what's going on now, they are not helping her at all.  She is asking if she can take more than one of the 0.25mg , or if you will please call her in something new or stronger the [facility] on [address]

## 2011-09-11 NOTE — Telephone Encounter (Signed)
Needs to start citalopram 20 mg, half tablet daily for one week, then one tablet daily. #30, 1 RF Okay to increase Xanax to 0.5 mg 1 by mouth 4 times a day, last dose at bedtime to help her sleep.#60, no RF He symptoms severe over suicidal thoughts needs to call immediately

## 2011-09-11 NOTE — Progress Notes (Signed)
PFT done today. 

## 2011-09-12 ENCOUNTER — Other Ambulatory Visit: Payer: BC Managed Care – PPO

## 2011-09-14 ENCOUNTER — Encounter: Payer: Self-pay | Admitting: Internal Medicine

## 2011-09-16 ENCOUNTER — Institutional Professional Consult (permissible substitution) (INDEPENDENT_AMBULATORY_CARE_PROVIDER_SITE_OTHER): Payer: BC Managed Care – PPO | Admitting: Thoracic Surgery

## 2011-09-16 ENCOUNTER — Encounter: Payer: Self-pay | Admitting: Thoracic Surgery

## 2011-09-16 ENCOUNTER — Ambulatory Visit
Admission: RE | Admit: 2011-09-16 | Discharge: 2011-09-16 | Disposition: A | Discharge status: Home / Self Care | Payer: BC Managed Care – PPO | Source: Ambulatory Visit | Attending: Internal Medicine | Admitting: Internal Medicine

## 2011-09-16 VITALS — BP 125/78 | HR 72 | Resp 14 | Ht 63.0 in | Wt 134.0 lb

## 2011-09-16 DIAGNOSIS — R918 Other nonspecific abnormal finding of lung field: Secondary | ICD-10-CM

## 2011-09-16 DIAGNOSIS — R222 Localized swelling, mass and lump, trunk: Secondary | ICD-10-CM

## 2011-09-16 MED ORDER — GADOBENATE DIMEGLUMINE 529 MG/ML IV SOLN
12.0000 mL | Freq: Once | INTRAVENOUS | Status: AC | PRN
  Administered 2011-09-16: 12 mL via INTRAVENOUS

## 2011-09-16 NOTE — Progress Notes (Signed)
PCP is Jose Paz, MD, MD Referring Provider is Paz, Jose E, MD  Chief Complaint  Patient presents with  . Lung Mass    NEW LUNG MASS  PET 09/11/11  at [facility]     HPI: This 61-year-old Caucasian female has been smoking for over 40 years. A chest x-ray showed a right upper lobe mass. CT scan revealed a right upper lobe mass that was narrowing the right pulmonary artery as well as the right upper lobe and bronchus a PET scan was done which is positive in this area. She will get an MRI of the brain today. Pulmonary function tests showed an be see 2.59 or 83% of predicted with an FEV1 of 1.9 to her 83% predicted her diffusion capacity was 82% we plan to do a bronchoscopy with endobronchial ultrasound possible mediastinoscopy to determine a diagnosis. I feel that she is a stage IIa or 59 a cancer and will probably require radiation and chemotherapy. Risk of the procedure were explained to her and her family member. They understand the risk and agrees to the procedure.   Past Medical History  Diagnosis Date  . Hypertension   . High cholesterol     No past surgical history on file. she had a previous cholecystectomy with a biliary stent been placed in September of 2012 she also had a tubal ligation. No family history on file.  Social History History  Substance Use Topics  . Smoking status: Current Everyday Smoker  . Smokeless tobacco: Not on file  . Alcohol Use: Yes   she is divorced with 2 children  Current Outpatient Prescriptions  Medication Sig Dispense Refill  . ALPRAZolam (NIRAVAM) 0.5 MG dissolvable tablet Take 1 tablet (0.5 mg total) by mouth 4 (four) times daily as needed for anxiety.  60 tablet  0  . ALPRAZolam (XANAX) 0.25 MG tablet Take 0.25 mg by mouth daily as needed. For anxiety       . aspirin 81 MG tablet Take 81 mg by mouth daily.        . atorvastatin (LIPITOR) 80 MG tablet TAKE 1 TABLET BY MOUTH EVERY DAY  30 tablet  0  . citalopram (CELEXA) 20 MG tablet Take 1/2 tablet daily  for one week then increase to 1 tablet daily.  30 tablet  1  . lisinopril (PRINIVIL,ZESTRIL) 20 MG tablet TAKE 1 TABLET BY MOUTH EVERY DAY  30 tablet  1  . tiotropium (SPIRIVA HANDIHALER) 18 MCG inhalation capsule Place 1 capsule (18 mcg total) into inhaler and inhale daily.  30 capsule  12  . ZETIA 10 MG tablet TAKE ONE TABLET BY MOUTH ONCE DAILY  15 tablet  0    Allergies  Allergen Reactions  . Codeine Nausea And Vomiting  . Penicillins Rash    Review of Systems  Constitutional: Negative.   HENT: Negative.   Eyes: Negative.   Respiratory: Positive for cough and wheezing.   Gastrointestinal: Positive for diarrhea and constipation.  Genitourinary: Negative.   Neurological: Negative.   Hematological: Negative.   Psychiatric/Behavioral: Negative.     BP 125/78  Pulse 72  Resp 14  Ht 5' 3" (1.6 m)  Wt 134 lb (60.782 kg)  BMI 23.74 kg/m2  SpO2 98% Physical Exam  Constitutional: She is oriented to person, place, and time. She appears well-developed and well-nourished.  HENT:  Head: Normocephalic and atraumatic.  Right Ear: External ear normal.  Left Ear: External ear normal.  Nose: Nose normal.  Mouth/Throat: Oropharynx is clear and   moist.  Eyes: Conjunctivae and EOM are normal.  Neck: Normal range of motion. Neck supple.  Cardiovascular: Normal rate, normal heart sounds and intact distal pulses.   Pulmonary/Chest: Effort normal and breath sounds normal.  Abdominal: Soft. Bowel sounds are normal.  Musculoskeletal: Normal range of motion.  Lymphadenopathy:    She has cervical adenopathy.  Neurological: She is alert and oriented to person, place, and time. She has normal reflexes.  Skin: Skin is dry.  Psychiatric: She has a normal mood and affect. Her behavior is normal. Judgment and thought content normal.     Diagnostic Tests: CT scan shows right upper lobe mass with right pulmonary artery and right upper lobe bronchus narrowing PET scan is positive in this area MRI  pending pulmonary function test are satisfactory  Impression: Probable stage IIIa non-small cell lung cancer   Plan: Bronchoscopy with endobronchial ultrasound mediastinoscopy  

## 2011-09-17 ENCOUNTER — Encounter (HOSPITAL_COMMUNITY)
Admission: RE | Admit: 2011-09-17 | Discharge: 2011-09-17 | Disposition: A | Discharge status: Home / Self Care | Payer: BC Managed Care – PPO | Source: Ambulatory Visit | Attending: Thoracic Surgery | Admitting: Thoracic Surgery

## 2011-09-17 ENCOUNTER — Encounter: Payer: Self-pay | Admitting: Internal Medicine

## 2011-09-17 ENCOUNTER — Other Ambulatory Visit: Payer: Self-pay

## 2011-09-17 ENCOUNTER — Encounter (HOSPITAL_COMMUNITY): Payer: Self-pay

## 2011-09-17 ENCOUNTER — Ambulatory Visit (HOSPITAL_COMMUNITY)
Admission: RE | Admit: 2011-09-17 | Discharge: 2011-09-17 | Disposition: A | Discharge status: Home / Self Care | Payer: BC Managed Care – PPO | Source: Ambulatory Visit | Attending: Thoracic Surgery | Admitting: Thoracic Surgery

## 2011-09-17 DIAGNOSIS — Z01818 Encounter for other preprocedural examination: Secondary | ICD-10-CM | POA: Insufficient documentation

## 2011-09-17 DIAGNOSIS — Z01810 Encounter for preprocedural cardiovascular examination: Secondary | ICD-10-CM | POA: Insufficient documentation

## 2011-09-17 DIAGNOSIS — J984 Other disorders of lung: Secondary | ICD-10-CM | POA: Insufficient documentation

## 2011-09-17 DIAGNOSIS — Z01812 Encounter for preprocedural laboratory examination: Secondary | ICD-10-CM | POA: Insufficient documentation

## 2011-09-17 DIAGNOSIS — D381 Neoplasm of uncertain behavior of trachea, bronchus and lung: Secondary | ICD-10-CM

## 2011-09-17 HISTORY — DX: Anxiety disorder, unspecified: F41.9

## 2011-09-17 LAB — TYPE AND SCREEN: ABO/RH(D): A POS

## 2011-09-17 LAB — COMPREHENSIVE METABOLIC PANEL
BUN: 9 mg/dL (ref 6–23)
CO2: 25 mEq/L (ref 19–32)
Calcium: 9.7 mg/dL (ref 8.4–10.5)
Creatinine, Ser: 0.59 mg/dL (ref 0.50–1.10)
GFR calc Af Amer: >90 mL/min (ref >90)
GFR calc non Af Amer: >90 mL/min (ref >90)
Glucose, Bld: 87 mg/dL (ref 70–99)
Total Bilirubin: 0.5 mg/dL (ref 0.3–1.2)

## 2011-09-17 LAB — CBC
HCT: 36.1 % (ref 36.0–46.0)
Hemoglobin: 12.3 g/dL (ref 12.0–15.0)
MCH: 30.8 pg (ref 26.0–34.0)
MCV: 90.3 fL (ref 78.0–100.0)
Platelets: 224 10*3/uL (ref 150–400)
RBC: 4 MIL/uL (ref 3.87–5.11)

## 2011-09-17 LAB — PROTIME-INR
INR: 0.97 (ref 0.00–1.49)
Prothrombin Time: 13.1 seconds (ref 11.6–15.2)

## 2011-09-17 LAB — ABO/RH: ABO/RH(D): A POS

## 2011-09-17 NOTE — Pre-Procedure Instructions (Addendum)
22 [name] [name]  09/17/2011   Your procedure is scheduled on: Friday 1/25/13I Report to [facility] [phone].  Call this number if you have problems the morning of surgery: [phone]   Remember:   Do not eat food:After Midnight.  May have clear liquids: up to 4 Hours before arrival.  Clear liquids include soda, tea, black coffee, apple or grape juice, broth.  Take these medicines the morning of surgery with A SIP OF WATER: XANAX,CELEXA   Do not wear jewelry, make-up or nail polish.  Do not wear lotions, powders, or perfumes. You may wear deodorant.  Do not shave 48 hours prior to surgery.  Do not bring valuables to the hospital.  Contacts, dentures or bridgework may not be worn into surgery.  Leave suitcase in the car. After surgery it may be brought to your room.  For patients admitted to the hospital, checkout time is 11:00 AM the day of discharge.   Patients discharged the day of surgery will not be allowed to drive home.  Name and phone number of your driver: ANDY Murguia Special Instructions: CHG Shower Use Special Wash: 1/2 bottle night before surgery and 1/2 bottle morning of surgery.   Please read over the following fact sheets that you were given: MRSA Information and Surgical Site Infection Prevention

## 2011-09-18 ENCOUNTER — Encounter: Payer: Self-pay | Admitting: Internal Medicine

## 2011-09-18 MED ORDER — VANCOMYCIN HCL IN DEXTROSE 1-5 GM/200ML-% IV SOLN
1000.0000 mg | INTRAVENOUS | Status: AC
  Administered 2011-09-19: 1000 mg via INTRAVENOUS
  Filled 2011-09-18: qty 200

## 2011-09-19 ENCOUNTER — Ambulatory Visit (HOSPITAL_COMMUNITY): Payer: BC Managed Care – PPO

## 2011-09-19 ENCOUNTER — Other Ambulatory Visit: Payer: Self-pay | Admitting: Thoracic Surgery

## 2011-09-19 ENCOUNTER — Encounter (HOSPITAL_COMMUNITY)
Admission: RE | Disposition: A | Discharge status: Home / Self Care | Payer: Self-pay | Source: Ambulatory Visit | Attending: Thoracic Surgery

## 2011-09-19 ENCOUNTER — Encounter (HOSPITAL_COMMUNITY): Payer: Self-pay

## 2011-09-19 ENCOUNTER — Encounter (HOSPITAL_COMMUNITY): Payer: Self-pay | Admitting: *Deleted

## 2011-09-19 ENCOUNTER — Ambulatory Visit (HOSPITAL_COMMUNITY)
Admission: RE | Admit: 2011-09-19 | Discharge: 2011-09-19 | Disposition: A | Discharge status: Home / Self Care | Payer: BC Managed Care – PPO | Source: Ambulatory Visit | Attending: Thoracic Surgery | Admitting: Thoracic Surgery

## 2011-09-19 DIAGNOSIS — C3490 Malignant neoplasm of unspecified part of unspecified bronchus or lung: Secondary | ICD-10-CM

## 2011-09-19 DIAGNOSIS — I10 Essential (primary) hypertension: Secondary | ICD-10-CM | POA: Insufficient documentation

## 2011-09-19 DIAGNOSIS — Z87891 Personal history of nicotine dependence: Secondary | ICD-10-CM | POA: Insufficient documentation

## 2011-09-19 DIAGNOSIS — J4489 Other specified chronic obstructive pulmonary disease: Secondary | ICD-10-CM | POA: Insufficient documentation

## 2011-09-19 DIAGNOSIS — J449 Chronic obstructive pulmonary disease, unspecified: Secondary | ICD-10-CM | POA: Insufficient documentation

## 2011-09-19 DIAGNOSIS — D381 Neoplasm of uncertain behavior of trachea, bronchus and lung: Secondary | ICD-10-CM

## 2011-09-19 DIAGNOSIS — C771 Secondary and unspecified malignant neoplasm of intrathoracic lymph nodes: Secondary | ICD-10-CM | POA: Insufficient documentation

## 2011-09-19 DIAGNOSIS — C3410 Malignant neoplasm of upper lobe, unspecified bronchus or lung: Secondary | ICD-10-CM | POA: Insufficient documentation

## 2011-09-19 HISTORY — DX: Malignant neoplasm of unspecified part of unspecified bronchus or lung: C34.90

## 2011-09-19 SURGERY — BRONCHOSCOPY, WITH EBUS
Anesthesia: General | Site: Chest | Wound class: Clean Contaminated

## 2011-09-19 MED ORDER — PROMETHAZINE HCL 25 MG/ML IJ SOLN: 6.2500 mg | INTRAMUSCULAR | Status: DC | PRN

## 2011-09-19 MED ORDER — DEXAMETHASONE SODIUM PHOSPHATE 4 MG/ML IJ SOLN
INTRAMUSCULAR | Status: DC | PRN
  Administered 2011-09-19: 8 mg via INTRAVENOUS

## 2011-09-19 MED ORDER — NEOSTIGMINE METHYLSULFATE 1 MG/ML IJ SOLN
INTRAMUSCULAR | Status: DC | PRN
  Administered 2011-09-19: 4 mg via INTRAVENOUS

## 2011-09-19 MED ORDER — OXYCODONE HCL 5 MG PO TABS: 5.0000 mg | ORAL_TABLET | ORAL | Status: DC | PRN

## 2011-09-19 MED ORDER — HYDROMORPHONE HCL PF 1 MG/ML IJ SOLN: 0.2500 mg | INTRAMUSCULAR | Status: DC | PRN

## 2011-09-19 MED ORDER — SODIUM CHLORIDE 0.9 % IV SOLN: 250.0000 mL | INTRAVENOUS | Status: DC | PRN

## 2011-09-19 MED ORDER — SODIUM CHLORIDE 0.9 % IJ SOLN: 3.0000 mL | Freq: Two times a day (BID) | INTRAMUSCULAR | Status: DC

## 2011-09-19 MED ORDER — EPHEDRINE SULFATE 50 MG/ML IJ SOLN
INTRAMUSCULAR | Status: DC | PRN
  Administered 2011-09-19: 5 mg via INTRAVENOUS
  Administered 2011-09-19: 10 mg via INTRAVENOUS

## 2011-09-19 MED ORDER — VECURONIUM BROMIDE 10 MG IV SOLR
INTRAVENOUS | Status: DC | PRN
  Administered 2011-09-19: 4 mg via INTRAVENOUS

## 2011-09-19 MED ORDER — MEPERIDINE HCL 25 MG/ML IJ SOLN: 6.2500 mg | INTRAMUSCULAR | Status: DC | PRN

## 2011-09-19 MED ORDER — PROPOFOL 10 MG/ML IV EMUL
INTRAVENOUS | Status: DC | PRN
  Administered 2011-09-19: 75 ug/kg/min via INTRAVENOUS

## 2011-09-19 MED ORDER — PROPOFOL 10 MG/ML IV EMUL
INTRAVENOUS | Status: DC | PRN
  Administered 2011-09-19: 150 mg via INTRAVENOUS

## 2011-09-19 MED ORDER — ONDANSETRON HCL 4 MG/2ML IJ SOLN
INTRAMUSCULAR | Status: DC | PRN
  Administered 2011-09-19: 4 mg via INTRAVENOUS

## 2011-09-19 MED ORDER — MIDAZOLAM HCL 5 MG/5ML IJ SOLN
INTRAMUSCULAR | Status: DC | PRN
  Administered 2011-09-19: 1 mg via INTRAVENOUS

## 2011-09-19 MED ORDER — FENTANYL CITRATE 0.05 MG/ML IJ SOLN
INTRAMUSCULAR | Status: DC | PRN
  Administered 2011-09-19: 50 ug via INTRAVENOUS
  Administered 2011-09-19: 100 ug via INTRAVENOUS

## 2011-09-19 MED ORDER — SODIUM CHLORIDE 0.9 % IJ SOLN: 3.0000 mL | INTRAMUSCULAR | Status: DC | PRN

## 2011-09-19 MED ORDER — ONDANSETRON HCL 4 MG/2ML IJ SOLN: 4.0000 mg | Freq: Four times a day (QID) | INTRAMUSCULAR | Status: DC | PRN

## 2011-09-19 MED ORDER — GLYCOPYRROLATE 0.2 MG/ML IJ SOLN
INTRAMUSCULAR | Status: DC | PRN
  Administered 2011-09-19: .8 mg via INTRAVENOUS

## 2011-09-19 MED ORDER — EPINEPHRINE HCL 1 MG/ML IJ SOLN
INTRAMUSCULAR | Status: DC | PRN
  Administered 2011-09-19: 1 mg

## 2011-09-19 MED ORDER — 0.9 % SODIUM CHLORIDE (POUR BTL) OPTIME
TOPICAL | Status: DC | PRN
  Administered 2011-09-19: 1000 mL

## 2011-09-19 MED ORDER — ACETAMINOPHEN 325 MG PO TABS: 650.0000 mg | ORAL_TABLET | ORAL | Status: DC | PRN

## 2011-09-19 MED ORDER — DROPERIDOL 2.5 MG/ML IJ SOLN
INTRAMUSCULAR | Status: DC | PRN
  Administered 2011-09-19: 0.625 mg via INTRAVENOUS

## 2011-09-19 MED ORDER — PROMETHAZINE HCL 25 MG/ML IJ SOLN: 12.5000 mg | Freq: Four times a day (QID) | INTRAMUSCULAR | Status: DC | PRN

## 2011-09-19 MED ORDER — ACETAMINOPHEN 650 MG RE SUPP: 650.0000 mg | RECTAL | Status: DC | PRN

## 2011-09-19 MED ORDER — LACTATED RINGERS IV SOLN
INTRAVENOUS | Status: DC | PRN
  Administered 2011-09-19 (×2): via INTRAVENOUS

## 2011-09-19 SURGICAL SUPPLY — 50 items
BLADE SURG 10 STRL SS (BLADE) IMPLANT
BRUSH CYTOL CELLEBRITY 1.5X140 (MISCELLANEOUS) IMPLANT
CANISTER SUCTION 2500CC (MISCELLANEOUS) ×2 IMPLANT
CLIP TI MEDIUM 6 (CLIP) IMPLANT
CLOTH BEACON ORANGE TIMEOUT ST (SAFETY) ×2 IMPLANT
CONT SPEC 4OZ CLIKSEAL STRL BL (MISCELLANEOUS) ×2 IMPLANT
COVER SURGICAL LIGHT HANDLE (MISCELLANEOUS) IMPLANT
COVER TABLE BACK 60X90 (DRAPES) ×2 IMPLANT
DERMABOND ADVANCED (GAUZE/BANDAGES/DRESSINGS)
DERMABOND ADVANCED .7 DNX12 (GAUZE/BANDAGES/DRESSINGS) IMPLANT
DRAPE CAMERA VIDEO/LASER (DRAPES) IMPLANT
DRAPE CHEST BREAST 15X10 FENES (DRAPES) IMPLANT
ELECT CAUTERY BLADE 6.4 (BLADE) IMPLANT
ELECT REM PT RETURN 9FT ADLT (ELECTROSURGICAL)
ELECTRODE REM PT RTRN 9FT ADLT (ELECTROSURGICAL) IMPLANT
FORCEPS BIOP RJ4 1.8 (CUTTING FORCEPS) IMPLANT
FORCEPS RADIAL JAW LRG 4 PULM (INSTRUMENTS) ×1 IMPLANT
GAUZE SPONGE 4X4 16PLY XRAY LF (GAUZE/BANDAGES/DRESSINGS) IMPLANT
GLOVE SURG SIGNA 7.5 PF LTX (GLOVE) ×2 IMPLANT
GLOVE SURG SS PI 7.5 STRL IVOR (GLOVE) ×4 IMPLANT
GOWN BRE IMP PREV XXLGXLNG (GOWN DISPOSABLE) IMPLANT
GOWN STRL NON-REIN LRG LVL3 (GOWN DISPOSABLE) IMPLANT
HEMOSTAT SURGICEL 2X14 (HEMOSTASIS) IMPLANT
KIT BASIN OR (CUSTOM PROCEDURE TRAY) IMPLANT
KIT ROOM TURNOVER OR (KITS) ×2 IMPLANT
MARKER SKIN DUAL TIP RULER LAB (MISCELLANEOUS) ×2 IMPLANT
NEEDLE BIOPSY TRANSBRONCH 21G (NEEDLE) IMPLANT
NEEDLE SYS SONOTIP II EBUSTBNA (NEEDLE) ×2 IMPLANT
NS IRRIG 1000ML POUR BTL (IV SOLUTION) ×2 IMPLANT
OIL SILICONE PENTAX (PARTS (SERVICE/REPAIRS)) ×2 IMPLANT
PACK SURGICAL SETUP 50X90 (CUSTOM PROCEDURE TRAY) IMPLANT
PAD ARMBOARD 7.5X6 YLW CONV (MISCELLANEOUS) ×4 IMPLANT
PENCIL BUTTON HOLSTER BLD 10FT (ELECTRODE) IMPLANT
RADIAL JAW LRG 4 PULMONARY (INSTRUMENTS) ×1
SPONGE GAUZE 4X4 12PLY (GAUZE/BANDAGES/DRESSINGS) ×2 IMPLANT
SPONGE INTESTINAL PEANUT (DISPOSABLE) IMPLANT
SUT SILK 2 0 TIES 10X30 (SUTURE) IMPLANT
SUT VIC AB 2-0 CT1 27 (SUTURE)
SUT VIC AB 2-0 CT1 TAPERPNT 27 (SUTURE) IMPLANT
SWAB COLLECTION DEVICE MRSA (MISCELLANEOUS) IMPLANT
SYR 20CC LL (SYRINGE) ×2 IMPLANT
SYR 20ML ECCENTRIC (SYRINGE) ×4 IMPLANT
SYR 5ML LUER SLIP (SYRINGE) ×2 IMPLANT
SYRINGE 10CC LL (SYRINGE) IMPLANT
TOWEL OR 17X24 6PK STRL BLUE (TOWEL DISPOSABLE) ×2 IMPLANT
TOWEL OR 17X26 10 PK STRL BLUE (TOWEL DISPOSABLE) IMPLANT
TRAP SPECIMEN MUCOUS 40CC (MISCELLANEOUS) ×2 IMPLANT
TUBE ANAEROBIC SPECIMEN COL (MISCELLANEOUS) IMPLANT
TUBE CONNECTING 12X1/4 (SUCTIONS) ×2 IMPLANT
WATER STERILE IRR 1000ML POUR (IV SOLUTION) IMPLANT

## 2011-09-19 NOTE — Interval H&P Note (Signed)
History and Physical Interval Note:  09/19/2011 7:15 AM  [name] [name]  has presented today for surgery, with the diagnosis of LUNG MASS  The various methods of treatment have been discussed with the patient and family. After consideration of risks, benefits and other options for treatment, the patient has consented to  Procedure(s): VIDEO BRONCHOSCOPY WITH ENDOBRONCHIAL ULTRASOUND MEDIASTINOSCOPY as a surgical intervention .  The patients' history has been reviewed, patient examined, no change in status, stable for surgery.  I have reviewed the patients' chart and labs.  Questions were answered to the patient's satisfaction.     Cameron Proud

## 2011-09-19 NOTE — Op Note (Signed)
NAME:  [name] [name], [name] [name]       ACCOUNT NO.:  [id number]  MEDICAL RECORD NO.:  [id number]  LOCATION:  [facility]                         FACILITY:  [facility]  PHYSICIAN:  Ines Bloomer, M.D. DATE OF BIRTH:  06/13/1951  DATE OF PROCEDURE:  09/19/2011 DATE OF DISCHARGE:                              OPERATIVE REPORT   PREOPERATIVE DIAGNOSIS:  Right upper lobe mass.  POSTOPERATIVE DIAGNOSIS:  Right upper lobe mass.  OPERATION PERFORMED:  Fiberoptic bronchoscopy with endobronchial ultrasound.  After general anesthesia, the video bronchoscope was passed through the endotracheal tube.  The carina was in the midline.  Left mainstem, left upper lobe, and left lower lobe orifices were normal. The right mainstem, you could see tumor coming out of the right upper lobe.  The right bronchus intermedius, right middle lobe, and right lower lobe orifices were normal.  We then removed the video bronchoscope and inserted the endobronchial ultrasound and found an area of 4R in which you could see the large tumor along the ultrasound and did 4 aspirations in this area.  We did this by inserting the sheath through the scope and passing the needle out through the sheath and into the tumor mass and then aspirating and then put the needle back into the sheath and removing the sheath and the needle together.  We did 4 passes into that tumor mass.  We then removed the endobronchial ultrasound, inserted the 2.8 video scope and did multiple biopsies of the mass that protruded from the right upper lobe.  The video bronchoscope was then removed.  The patient was awakened and returned to the recovery room in stable condition.     Ines Bloomer, M.D.     DPB/MEDQ  D:  09/19/2011  T:  09/19/2011  Job:  [id number]

## 2011-09-19 NOTE — Anesthesia Procedure Notes (Signed)
Procedure Name: Intubation Date/Time: 09/19/2011 7:28 AM Performed by: Carmela Rima Pre-anesthesia Checklist: Patient identified, Timeout performed, Emergency Drugs available, Suction available and Patient being monitored Patient Re-evaluated:Patient Re-evaluated prior to inductionOxygen Delivery Method: Circle System Utilized Preoxygenation: Pre-oxygenation with 100% oxygen Intubation Type: IV induction Ventilation: Mask ventilation without difficulty Laryngoscope Size: Mac and 3 Grade View: Grade I Tube type: Oral Tube size: 8.5 mm Number of attempts: 1 Placement Confirmation: ETT inserted through vocal cords under direct vision,  positive ETCO2,  CO2 detector and breath sounds checked- equal and bilateral Secured at: 21 cm Tube secured with: Tape Dental Injury: Teeth and Oropharynx as per pre-operative assessment

## 2011-09-19 NOTE — Progress Notes (Signed)
Pt seems to be waking up. Propofol turned off and pt meeting extubation criteria. Pt suctioned and extubated per Bronson Ing CRNA.  SM applied at 6l. o2 sat =100%. Will cont to monitor.

## 2011-09-19 NOTE — H&P (View-Only) (Signed)
PCP is [name], MD, MD Referring Provider is [name], MD  Chief Complaint  Patient presents with  . Lung Mass    NEW LUNG MASS  PET 09/11/11  at [facility]     HPI: This 61 year old Caucasian female has been smoking for over 40 years. A chest x-ray showed a right upper lobe mass. CT scan revealed a right upper lobe mass that was narrowing the right pulmonary artery as well as the right upper lobe and bronchus a PET scan was done which is positive in this area. She will get an MRI of the brain today. Pulmonary function tests showed an be see 2.59 or 83% of predicted with an FEV1 of 1.9 to her 83% predicted her diffusion capacity was 82% we plan to do a bronchoscopy with endobronchial ultrasound possible mediastinoscopy to determine a diagnosis. I feel that she is a stage IIa or 3 a cancer and will probably require radiation and chemotherapy. Risk of the procedure were explained to her and her family member. They understand the risk and agrees to the procedure.   Past Medical History  Diagnosis Date  . Hypertension   . High cholesterol     No past surgical history on file. she had a previous cholecystectomy with a biliary stent been placed in September of 2012 she also had a tubal ligation. No family history on file.  Social History History  Substance Use Topics  . Smoking status: Current Everyday Smoker  . Smokeless tobacco: Not on file  . Alcohol Use: Yes   she is divorced with 2 children  Current Outpatient Prescriptions  Medication Sig Dispense Refill  . ALPRAZolam (NIRAVAM) 0.5 MG dissolvable tablet Take 1 tablet (0.5 mg total) by mouth 4 (four) times daily as needed for anxiety.  60 tablet  0  . ALPRAZolam (XANAX) 0.25 MG tablet Take 0.25 mg by mouth daily as needed. For anxiety       . aspirin 81 MG tablet Take 81 mg by mouth daily.        Marland Kitchen atorvastatin (LIPITOR) 80 MG tablet TAKE 1 TABLET BY MOUTH EVERY DAY  30 tablet  0  . citalopram (CELEXA) 20 MG tablet Take 1/2 tablet daily  for one week then increase to 1 tablet daily.  30 tablet  1  . lisinopril (PRINIVIL,ZESTRIL) 20 MG tablet TAKE 1 TABLET BY MOUTH EVERY DAY  30 tablet  1  . tiotropium (SPIRIVA HANDIHALER) 18 MCG inhalation capsule Place 1 capsule (18 mcg total) into inhaler and inhale daily.  30 capsule  12  . ZETIA 10 MG tablet TAKE ONE TABLET BY MOUTH ONCE DAILY  15 tablet  0    Allergies  Allergen Reactions  . Codeine Nausea And Vomiting  . Penicillins Rash    Review of Systems  Constitutional: Negative.   HENT: Negative.   Eyes: Negative.   Respiratory: Positive for cough and wheezing.   Gastrointestinal: Positive for diarrhea and constipation.  Genitourinary: Negative.   Neurological: Negative.   Hematological: Negative.   Psychiatric/Behavioral: Negative.     BP 125/78  Pulse 72  Resp 14  Ht 5\' 3"  (1.6 m)  Wt 134 lb (60.782 kg)  BMI 23.74 kg/m2  SpO2 98% Physical Exam  Constitutional: She is oriented to person, place, and time. She appears well-developed and well-nourished.  HENT:  Head: Normocephalic and atraumatic.  Right Ear: External ear normal.  Left Ear: External ear normal.  Nose: Nose normal.  Mouth/Throat: Oropharynx is clear and  moist.  Eyes: Conjunctivae and EOM are normal.  Neck: Normal range of motion. Neck supple.  Cardiovascular: Normal rate, normal heart sounds and intact distal pulses.   Pulmonary/Chest: Effort normal and breath sounds normal.  Abdominal: Soft. Bowel sounds are normal.  Musculoskeletal: Normal range of motion.  Lymphadenopathy:    She has cervical adenopathy.  Neurological: She is alert and oriented to person, place, and time. She has normal reflexes.  Skin: Skin is dry.  Psychiatric: She has a normal mood and affect. Her behavior is normal. Judgment and thought content normal.     Diagnostic Tests: CT scan shows right upper lobe mass with right pulmonary artery and right upper lobe bronchus narrowing PET scan is positive in this area MRI  pending pulmonary function test are satisfactory  Impression: Probable stage IIIa non-small cell lung cancer   Plan: Bronchoscopy with endobronchial ultrasound mediastinoscopy

## 2011-09-19 NOTE — Anesthesia Postprocedure Evaluation (Signed)
  Anesthesia Post-op Note  Patient: [name] [name]  Procedure(s) Performed:  VIDEO BRONCHOSCOPY WITH ENDOBRONCHIAL ULTRASOUND  Patient Location: PACU  Anesthesia Type: General  Level of Consciousness: awake and alert   Airway and Oxygen Therapy: Patient Spontanous Breathing and Patient connected to face mask oxygen  Post-op Pain: none  Post-op Assessment: Post-op Vital signs reviewed, Patient's Cardiovascular Status Stable, Respiratory Function Stable and Patent Airway  Post-op Vital Signs: Reviewed and stable  Complications: No apparent anesthesia complications

## 2011-09-19 NOTE — Brief Op Note (Signed)
09/19/2011  8:12 AM  PATIENT:  [name] [name]  61 y.o. female  PRE-OPERATIVE DIAGNOSIS:  Right Upper Lobe Lung Mass  POST-OPERATIVE DIAGNOSIS:  Right Upper Lobe Lung Mass  PROCEDURE:  Procedure(s): VIDEO BRONCHOSCOPY WITH ENDOBRONCHIAL ULTRASOUND MEDIASTINOSCOPY  SURGEON:  Surgeon(s): D Karle Plumber, MD  PHYSICIAN ASSISTANT:   ASSISTANTS: none   ANESTHESIA:   general  EBL:  Total I/O In: 1300 [I.V.:1300] Out: -   BLOOD ADMINISTERED:none  DRAINS: none   LOCAL MEDICATIONS USED:  NONE  SPECIMEN:  Aspirate  DISPOSITION OF SPECIMEN:  PATHOLOGY  COUNTS:  YES  TOURNIQUET:  * No tourniquets in log *  DICTATION: .Other Dictation: Dictation Number [id number]  PLAN OF CARE: Discharge to home after PACU  PATIENT DISPOSITION:  PACU - hemodynamically stable.   Delay start of Pharmacological VTE agent (>24hrs) due to surgical blood loss or risk of bleeding: yes

## 2011-09-19 NOTE — Preoperative (Signed)
Beta Blockers   Reason not to administer Beta Blockers:Not Applicable 

## 2011-09-19 NOTE — Transfer of Care (Signed)
Immediate Anesthesia Transfer of Care Note  Patient: [name] [name]  Procedure(s) Performed:  VIDEO BRONCHOSCOPY WITH ENDOBRONCHIAL ULTRASOUND  Patient Location: PACU  Anesthesia Type: General  Level of Consciousness: sedated  Airway & Oxygen Therapy: Patient remains intubated per anesthesia plan  Post-op Assessment: Report given to PACU RN and Post -op Vital signs reviewed and stable  Post vital signs: Reviewed and stable  Complications: No apparent anesthesia complications

## 2011-09-19 NOTE — Anesthesia Preprocedure Evaluation (Addendum)
Anesthesia Evaluation  Patient identified by MRN, date of birth, ID band Patient awake    Reviewed: Allergy & Precautions, H&P , NPO status , Patient's Chart, lab work & pertinent test results, reviewed documented beta blocker date and time   History of Anesthesia Complications (+) PONV  Airway Mallampati: II TM Distance: >3 FB Neck ROM: full    Dental No notable dental hx. (+) Dental Advidsory Given and Teeth Intact   Pulmonary COPDformer smoker Lung CA clear to auscultation  Pulmonary exam normal       Cardiovascular hypertension, On Medications Regular Normal    Neuro/Psych Negative Neurological ROS  Negative Psych ROS   GI/Hepatic negative GI ROS, Neg liver ROS,   Endo/Other  Negative Endocrine ROS  Renal/GU negative Renal ROS  Genitourinary negative   Musculoskeletal   Abdominal   Peds  Hematology negative hematology ROS (+)   Anesthesia Other Findings   Reproductive/Obstetrics negative OB ROS                          Anesthesia Physical Anesthesia Plan  ASA: III  Anesthesia Plan: General   Post-op Pain Management:    Induction: Intravenous  Airway Management Planned: Oral ETT  Additional Equipment:   Intra-op Plan:   Post-operative Plan: Extubation in OR  Informed Consent: I have reviewed the patients History and Physical, chart, labs and discussed the procedure including the risks, benefits and alternatives for the proposed anesthesia with the patient or authorized representative who has indicated his/her understanding and acceptance.   Dental Advisory Given  Plan Discussed with: Anesthesiologist, CRNA and Surgeon  Anesthesia Plan Comments:        Anesthesia Quick Evaluation

## 2011-09-21 LAB — CULTURE, RESPIRATORY W GRAM STAIN

## 2011-09-22 ENCOUNTER — Other Ambulatory Visit (HOSPITAL_COMMUNITY): Payer: BC Managed Care – PPO

## 2011-09-22 ENCOUNTER — Ambulatory Visit (INDEPENDENT_AMBULATORY_CARE_PROVIDER_SITE_OTHER): Payer: BC Managed Care – PPO | Admitting: Thoracic Surgery

## 2011-09-22 ENCOUNTER — Encounter: Payer: Self-pay | Admitting: Thoracic Surgery

## 2011-09-22 VITALS — BP 148/92 | HR 75 | Resp 16 | Ht 63.0 in | Wt 133.0 lb

## 2011-09-22 DIAGNOSIS — Z09 Encounter for follow-up examination after completed treatment for conditions other than malignant neoplasm: Secondary | ICD-10-CM

## 2011-09-22 DIAGNOSIS — C3400 Malignant neoplasm of unspecified main bronchus: Secondary | ICD-10-CM

## 2011-09-22 NOTE — Progress Notes (Signed)
HPI patient returns for followup. Pathology results revealed small cell lung cancer. Options of treatment were discussed. I will refer her to our multidisciplinary thoracic oncology clinic. I feel that she is a low-grade small cell lung cancer. Her brain scan was negative. She will need to start chemotherapy soon as possible. The Port-A-Cath was discussed will see her again as needed   Current Outpatient Prescriptions  Medication Sig Dispense Refill  . ALPRAZolam (XANAX) 0.5 MG tablet Take 0.25 mg by mouth 3 (three) times daily as needed. For anxiety.      Marland Kitchen aspirin 81 MG tablet Take 81 mg by mouth daily.       Marland Kitchen atorvastatin (LIPITOR) 80 MG tablet Take 80 mg by mouth daily.      . citalopram (CELEXA) 20 MG tablet Take 20 mg by mouth daily.      Marland Kitchen ezetimibe (ZETIA) 10 MG tablet Take 10 mg by mouth daily.      Marland Kitchen lisinopril (PRINIVIL,ZESTRIL) 20 MG tablet Take 20 mg by mouth daily.      Marland Kitchen tiotropium (SPIRIVA HANDIHALER) 18 MCG inhalation capsule Place 1 capsule (18 mcg total) into inhaler and inhale daily.  30 capsule  12   No current facility-administered medications for this visit.   Facility-Administered Medications Ordered in Other Visits  Medication Dose Route Frequency Provider Last Rate Last Dose  . glycopyrrolate (ROBINUL) injection    PRN Carmela Rima, CRNA   0.8 mg at 09/19/11 1610  . neostigmine (PROSTIGMINE) injection   Intravenous PRN Carmela Rima, CRNA   4 mg at 09/19/11 9604     Review of Systems: No change  Physical Exam lungs are clear to auscultation percussion   Diagnostic Tests:  Revealed small cell lung cancer   Impression: Limited staged small cell lung cancer   Plan: Return medical oncology and radiation oncology

## 2011-09-25 ENCOUNTER — Encounter: Payer: Self-pay | Admitting: *Deleted

## 2011-09-25 ENCOUNTER — Encounter: Payer: Self-pay | Admitting: Internal Medicine

## 2011-09-25 ENCOUNTER — Ambulatory Visit
Admission: RE | Admit: 2011-09-25 | Discharge: 2011-09-25 | Disposition: A | Discharge status: Home / Self Care | Payer: BC Managed Care – PPO | Source: Ambulatory Visit | Attending: Radiation Oncology | Admitting: Radiation Oncology

## 2011-09-25 ENCOUNTER — Ambulatory Visit (HOSPITAL_BASED_OUTPATIENT_CLINIC_OR_DEPARTMENT_OTHER): Payer: BC Managed Care – PPO | Admitting: Internal Medicine

## 2011-09-25 VITALS — BP 134/79 | HR 78 | Temp 97.5°F | Resp 20 | Ht 63.0 in | Wt 133.0 lb

## 2011-09-25 DIAGNOSIS — C3410 Malignant neoplasm of upper lobe, unspecified bronchus or lung: Secondary | ICD-10-CM

## 2011-09-25 DIAGNOSIS — E7800 Pure hypercholesterolemia, unspecified: Secondary | ICD-10-CM | POA: Insufficient documentation

## 2011-09-25 DIAGNOSIS — Z79899 Other long term (current) drug therapy: Secondary | ICD-10-CM | POA: Insufficient documentation

## 2011-09-25 DIAGNOSIS — M810 Age-related osteoporosis without current pathological fracture: Secondary | ICD-10-CM | POA: Insufficient documentation

## 2011-09-25 DIAGNOSIS — C3490 Malignant neoplasm of unspecified part of unspecified bronchus or lung: Secondary | ICD-10-CM | POA: Insufficient documentation

## 2011-09-25 DIAGNOSIS — Z85118 Personal history of other malignant neoplasm of bronchus and lung: Secondary | ICD-10-CM | POA: Insufficient documentation

## 2011-09-25 DIAGNOSIS — I10 Essential (primary) hypertension: Secondary | ICD-10-CM | POA: Insufficient documentation

## 2011-09-25 DIAGNOSIS — Z510 Encounter for antineoplastic radiation therapy: Secondary | ICD-10-CM | POA: Insufficient documentation

## 2011-09-25 NOTE — Progress Notes (Signed)
[facility] CONSULT NOTE  REASON FOR CONSULTATION:  61 years old white female diagnosed with lung cancer  HPI [name] [name] is a 61 y.o. female was past medical history significant for hypertension, hypercholesterolemia and long history of smoking. In addition status post cholecystectomy with biliary stent placement in September of 2012. In early January of 2013, the patient had increased wheezes and shortness of breath. She was seen by her primary care physician Dr. Drue Novel. Chest x-ray on 09/05/2011 showed A suspicion of right suprahilar soft tissue fullness. Either central right lung nodule/mass or adenopathy cannot be excluded. This was followed by CT scan of the chest on 09/11/2011 an abnormal right suprahilar soft tissue mass that measures 2.9 x 3.9 x 4.6 cm in the transverse, AP, longitudinal dimensions. There is extension of the mass into the right mid and upper lobe pulmonary arteries. The right upper lobe bronchus is narrowed. The patient also had MRI of the brain performed on 09/17/2011 and it showed No acute abnormality and negative for metastatic disease to the brain. The patient also had a PET scan performed at [facility] hospital that showed no evidence for metastatic disease outside the known lung lesions. The patient was seen by Dr. Edwyna Shell and on 09/19/2011 she underwent Fiberoptic bronchoscopy with endobronchial ultrasound. The final pathology was consistent with small cell carcinoma. Dr. Edwyna Shell kindly referred the patient to me today for evaluation and recommendation regarding her condition. She was seen at the multidisciplinary thoracic oncology clinic [facility]) today. She denied having any significant complaints except for the wheezes. She denied having any significant chest pain, no shortness of breath no cough or hemoptysis. No significant weight loss. She has a history of smoking one pack per day for around 40 years quit a month ago. The patient was  accompanied by her husband Mardelle Matte and her sister Jasmine December.   @SFHPI @  Past Medical History  Diagnosis Date  . Hypertension   . High cholesterol   . Anxiety   . Osteoporosis   . Lung mass     Past Surgical History  Procedure Date  . Tubal ligation   . Cholecystectomy     Family history: Father died from bladder cancer in his 25s, mother had atrial fibrillation and an aunt with colon cancer.  Social History: History  Substance Use Topics  . Smoking status: Former Smoker    Types: Cigarettes    Quit date: 08/26/2011  . Smokeless tobacco: Former Neurosurgeon    Quit date: 09/01/2011  . Alcohol Use: Yes    Allergies  Allergen Reactions  . Codeine Nausea And Vomiting  . Penicillins Rash    Current Outpatient Prescriptions  Medication Sig Dispense Refill  . ALPRAZolam (XANAX) 0.5 MG tablet Take 0.25 mg by mouth 3 (three) times daily as needed. For anxiety.      Marland Kitchen aspirin 81 MG tablet Take 81 mg by mouth daily.       Marland Kitchen atorvastatin (LIPITOR) 80 MG tablet Take 80 mg by mouth daily.      . citalopram (CELEXA) 20 MG tablet Take 20 mg by mouth daily.      Marland Kitchen ezetimibe (ZETIA) 10 MG tablet Take 10 mg by mouth daily.      Marland Kitchen lisinopril (PRINIVIL,ZESTRIL) 20 MG tablet Take 20 mg by mouth daily.      Marland Kitchen tiotropium (SPIRIVA HANDIHALER) 18 MCG inhalation capsule Place 1 capsule (18 mcg total) into inhaler and inhale daily.  30 capsule  12  Review of Systems  A comprehensive review of systems was negative except for: Respiratory: positive for wheezing  Physical Exam  ZOX:WRUEA, healthy, no distress, well nourished, well developed and anxious SKIN: skin color, texture, turgor are normal HEAD: Normocephalic, No masses, lesions, tenderness or abnormalities EYES: normal, PERRLA EARS: External ears normal OROPHARYNX:no exudate, no erythema and lips, buccal mucosa, and tongue normal  NECK: supple, no adenopathy LYMPH:  no palpable lymphadenopathy, no hepatosplenomegaly BREAST:not  examined LUNGS: clear to auscultation , and palpation HEART: regular rate & rhythm, no murmurs and no gallops ABDOMEN:abdomen soft, non-tender, normal bowel sounds and no masses or organomegaly BACK: Back symmetric, no curvature. EXTREMITIES:no joint deformities, effusion, or inflammation, no edema, no skin discoloration, no clubbing, no cyanosis  NEURO: alert & oriented x 3 with fluent speech, no focal motor/sensory deficits, gait normal    Studies/Results: Dg Chest 2 View Within Previous 72 Hours.  Films Obtained On Friday Are Acceptable For Monday And Tuesday Cases  09/17/2011  *RADIOLOGY REPORT*  Clinical Data: Preop right lung mass.  CHEST - 2 VIEW  Comparison: CT 09/09/2011 and the  Findings: Normal mediastinum and cardiac silhouette.  Right suprahilar mass is again demonstrated.  No effusion, infiltrate, or pneumothorax.  Lungs are hyperinflated.  IMPRESSION:  1.  No acute cardiopulmonary process. 2.  Hyperinflated lungs. 3.  Right hilar mass  Original Report Authenticated By: Genevive Bi, M.D.   Dg Chest 2 View  09/05/2011  *RADIOLOGY REPORT*  Clinical Data: Cough and wheezing.  Smoker.  CHEST - 2 VIEW  Comparison: 05/25/2011  Findings: Mild hyperinflation.  Mild S-shaped thoracic spine curvature. Midline trachea.  Normal heart size.  Subtle right suprahilar soft tissue fullness suspected on both frontal and lateral views. No pleural effusion or pneumothorax.  Diffuse peribronchial thickening.  IMPRESSION: A suspicion of right suprahilar soft tissue fullness.  Either central right lung nodule/mass or adenopathy cannot be excluded. Further characterization with chest CT (preferably with contrast) recommended. These results will be called to the ordering clinician or representative by the Radiologist Assistant, and communication documented in the PACS Dashboard.  Underlying hyperinflation/chronic bronchitis.  Original Report Authenticated By: Consuello Bossier, M.D.   Ct Chest W  Contrast  09/09/2011  *RADIOLOGY REPORT*  Clinical Data: Abnormal chest x-ray with right suprahilar enlargement, smoker  CT CHEST WITH CONTRAST  Technique:  Multidetector CT imaging of the chest was performed following the standard protocol during bolus administration of intravenous contrast.  Contrast: 80mL OMNIPAQUE IOHEXOL 300 MG/ML IV SOLN  Comparison: Chest x-ray dated September 05, 2011  Findings: There is an abnormal right suprahilar soft tissue mass that measures 2.9 x 3.9 x 4.6 cm in the transverse, AP, longitudinal dimensions. There is extension of the mass into the right mid and upper lobe pulmonary arteries. The right upper lobe bronchus is narrowed.  No gross mediastinal, hilar, or axillary adenopathy are identified.  There are no osseous lesions.  The left lung is clear.  There are no satellite lesions within the right lung.  The visualized upper abdomen is unremarkable.  IMPRESSION: There is an abnormal right suprahilar soft tissue mass with extension into the pulmonary arteries and narrowing of the right upper lobe bronchus.  This was made a call report.  Original Report Authenticated By: Brandon Melnick, M.D.   Mr Laqueta Jean Wo Contrast  09/17/2011  *RADIOLOGY REPORT*  Clinical Data: Lung mass, possible lung cancer  MRI HEAD WITHOUT AND WITH CONTRAST  Technique:  Multiplanar, multiecho pulse sequences of the  brain and surrounding structures were obtained according to standard protocol without and with intravenous contrast  Contrast: 12mL MULTIHANCE GADOBENATE DIMEGLUMINE 529 MG/ML IV SOLN  Comparison: None.  Findings: Ventricle size is normal.  Negative for acute infarction. Ill-defined hyperintensity in the right pons on T2 is not confirmed on any other sequences and may be an artifact. Alternatively, this could represent chronic ischemia.  Cerebral white matter is within normal limits.  Negative for demyelinating disease.  Negative for hemorrhage or mass.  Postcontrast imaging reveals no enhancing  lesions.  Negative for metastatic disease.  Mucosal thickening in the left sphenoid sinus.  No air-fluid levels.  IMPRESSION: No acute abnormality and negative for metastatic disease to the brain.  Original Report Authenticated By: Camelia Phenes, M.D.     ASSESSMENT: This is a very pleasant 61 years old white female recently diagnosed with limited stage small cell lung cancer. I have a lengthy discussion with the patient and her family today about her disease stage, prognosis and treatment options. I showed them the images of the scan results.  PLAN: I recommended for the patient systemic chemotherapy with cisplatin 60 mg/M2 on day 1 in addition to etoposide 120 mg/M2 on days 1, 2 and 3 with Neulasta support on day 4. This would be concurrent with radiotherapy under the care of Dr. Mitzi Hansen who will see the patient later today. I discussed with the patient and her family the adverse effect of the chemotherapy including but not limited to alopecia, myelosuppression, nausea, vomiting, peripheral neuropathy, liver or renal dysfunction as well as hearing deficit. After completion course of concurrent chemoradiation, the patient will be considered for prophylactic cranial irradiation. The patient would have a chemotherapy education class early next week before starting the first cycle of the chemotherapy next week . The patient was given prescription today for Compazine 10 mg by mouth every 6 hours as needed for nausea . She was also given prescription for cranial prosthesis.  The patient was seen during this visit by the social worker at the cancer Center as well as the lung cancer navigator provided emotional support to the patient and her family. She would come back for followup visit in 2 weeks for evaluation and management any adverse effect of her chemotherapy. The patient was advised to call me immediately if she has any concerning symptoms in the interval.  All questions were answered. The patient  knows to call the clinic with any problems, questions or concerns. We can certainly see the patient much sooner if necessary.  Thank you so much for allowing me to participate in the care of Lilliann C Knapke. I will continue to follow up the patient with you and assist in her care.  I spent 30 minutes counseling the patient face to face. The total time spent in the appointment was 60 minutes.   Yves Fodor K. 09/25/2011, 3:30 PM

## 2011-09-25 NOTE — Progress Notes (Signed)
[facility] Psychosocial Distress Screening Clinical Social Work  Clinical Social Work was referred by distress screening protocol.  The patient scored a 4 on the Psychosocial Distress Thermometer which indicates moderate distress. Clinical Social Worker met with patient at [facility] to assess for distress and other psychosocial needs. The patient was accompanied by her spouse and sister. The patient appeared anxious during new patient visit and would like to begin treatment as soon as possible. CSW shared information on support programs and available resources. CSW states she has a large support system including family and friends.  CSW encouraged patient to contact if any other needs, concerns, or questions should arise.     Clinical Social Worker follow up needed: no  If yes, follow up plan:  Kathrin Penner, MSW, [facility] Clinical Social Worker [facility] [phone]

## 2011-09-25 NOTE — Progress Notes (Signed)
Spoke with pt and family at [facility] 09/25/11.  Questions and concerns answered.  Distress screening and dietary screening completed.  Educational/resource information given.

## 2011-09-26 ENCOUNTER — Telehealth: Payer: Self-pay | Admitting: Internal Medicine

## 2011-09-26 ENCOUNTER — Encounter: Payer: Self-pay | Admitting: *Deleted

## 2011-09-26 NOTE — Telephone Encounter (Signed)
pt was called with 2/5 appts and will p/u a sch at that time  aom

## 2011-09-26 NOTE — Progress Notes (Signed)
Called and spoke with pt regarding up coming appt.  Anne from scheduling will call pt with exact time for appt's

## 2011-09-28 ENCOUNTER — Other Ambulatory Visit: Payer: Self-pay | Admitting: Internal Medicine

## 2011-09-29 ENCOUNTER — Ambulatory Visit
Admission: RE | Admit: 2011-09-29 | Discharge: 2011-09-29 | Disposition: A | Discharge status: Home / Self Care | Payer: BC Managed Care – PPO | Source: Ambulatory Visit | Attending: Radiation Oncology | Admitting: Radiation Oncology

## 2011-09-29 ENCOUNTER — Encounter: Payer: Self-pay | Admitting: Radiation Oncology

## 2011-09-29 VITALS — BP 119/82 | HR 81 | Temp 98.4°F | Resp 20 | Wt 137.3 lb

## 2011-09-29 DIAGNOSIS — C3410 Malignant neoplasm of upper lobe, unspecified bronchus or lung: Secondary | ICD-10-CM

## 2011-09-29 DIAGNOSIS — C3490 Malignant neoplasm of unspecified part of unspecified bronchus or lung: Secondary | ICD-10-CM

## 2011-09-29 HISTORY — DX: Malignant neoplasm of unspecified part of unspecified bronchus or lung: C34.90

## 2011-09-29 NOTE — Progress Notes (Signed)
Pt denies cough, sob, pain.

## 2011-09-29 NOTE — Telephone Encounter (Signed)
Refill done.  

## 2011-09-29 NOTE — Progress Notes (Signed)
Met with patient to discuss RO billing.   Patient had no concerns today, but did mention about wigs.  Told patient would find out information and get back with her on next visit.

## 2011-09-30 ENCOUNTER — Other Ambulatory Visit: Payer: BC Managed Care – PPO

## 2011-09-30 ENCOUNTER — Encounter: Payer: Self-pay | Admitting: *Deleted

## 2011-09-30 ENCOUNTER — Ambulatory Visit (HOSPITAL_BASED_OUTPATIENT_CLINIC_OR_DEPARTMENT_OTHER): Payer: BC Managed Care – PPO

## 2011-09-30 ENCOUNTER — Other Ambulatory Visit (HOSPITAL_BASED_OUTPATIENT_CLINIC_OR_DEPARTMENT_OTHER): Payer: BC Managed Care – PPO | Admitting: Lab

## 2011-09-30 ENCOUNTER — Ambulatory Visit: Payer: BC Managed Care – PPO | Admitting: Nutrition

## 2011-09-30 VITALS — BP 151/85 | HR 63 | Temp 98.1°F

## 2011-09-30 DIAGNOSIS — C3410 Malignant neoplasm of upper lobe, unspecified bronchus or lung: Secondary | ICD-10-CM

## 2011-09-30 DIAGNOSIS — Z5111 Encounter for antineoplastic chemotherapy: Secondary | ICD-10-CM

## 2011-09-30 DIAGNOSIS — C3490 Malignant neoplasm of unspecified part of unspecified bronchus or lung: Secondary | ICD-10-CM

## 2011-09-30 LAB — CBC WITH DIFFERENTIAL/PLATELET
BASO%: 0.5 % (ref 0.0–2.0)
Eosinophils Absolute: 0.2 10*3/uL (ref 0.0–0.5)
MCHC: 33.2 g/dL (ref 31.5–36.0)
MCV: 91 fL (ref 79.5–101.0)
MONO%: 8.1 % (ref 0.0–14.0)
NEUT#: 3.9 10*3/uL (ref 1.5–6.5)
RBC: 4.1 10*6/uL (ref 3.70–5.45)
RDW: 12.5 % (ref 11.2–14.5)
WBC: 5.8 10*3/uL (ref 3.9–10.3)

## 2011-09-30 LAB — COMPREHENSIVE METABOLIC PANEL
ALT: 15 U/L (ref 0–35)
AST: 19 U/L (ref 0–37)
Albumin: 4.6 g/dL (ref 3.5–5.2)
Alkaline Phosphatase: 36 U/L — ABNORMAL LOW (ref 39–117)
Glucose, Bld: 98 mg/dL (ref 70–99)
Potassium: 4.9 mEq/L (ref 3.5–5.3)
Sodium: 135 mEq/L (ref 135–145)
Total Bilirubin: 0.5 mg/dL (ref 0.3–1.2)
Total Protein: 6.4 g/dL (ref 6.0–8.3)

## 2011-09-30 MED ORDER — PALONOSETRON HCL INJECTION 0.25 MG/5ML
0.2500 mg | Freq: Once | INTRAVENOUS | Status: AC
  Administered 2011-09-30: 0.25 mg via INTRAVENOUS

## 2011-09-30 MED ORDER — SODIUM CHLORIDE 0.9 % IV SOLN
150.0000 mg | Freq: Once | INTRAVENOUS | Status: AC
  Administered 2011-09-30: 150 mg via INTRAVENOUS
  Filled 2011-09-30: qty 5

## 2011-09-30 MED ORDER — SODIUM CHLORIDE 0.9 % IV SOLN
Freq: Once | INTRAVENOUS | Status: AC
  Administered 2011-09-30: 12:00:00 via INTRAVENOUS

## 2011-09-30 MED ORDER — DEXAMETHASONE SODIUM PHOSPHATE 4 MG/ML IJ SOLN
12.0000 mg | Freq: Once | INTRAMUSCULAR | Status: AC
  Administered 2011-09-30: 12 mg via INTRAVENOUS

## 2011-09-30 MED ORDER — SODIUM CHLORIDE 0.9 % IV SOLN
60.0000 mg/m2 | Freq: Once | INTRAVENOUS | Status: AC
  Administered 2011-09-30: 98 mg via INTRAVENOUS
  Filled 2011-09-30: qty 98

## 2011-09-30 MED ORDER — SODIUM CHLORIDE 0.9 % IV SOLN
120.0000 mg/m2 | Freq: Once | INTRAVENOUS | Status: AC
  Administered 2011-09-30: 200 mg via INTRAVENOUS
  Filled 2011-09-30: qty 10

## 2011-09-30 MED ORDER — POTASSIUM CHLORIDE 2 MEQ/ML IV SOLN
Freq: Once | INTRAVENOUS | Status: AC
  Administered 2011-09-30: 12:00:00 via INTRAVENOUS
  Filled 2011-09-30: qty 10

## 2011-09-30 NOTE — Progress Notes (Signed)
CC:   Lajuana Matte, M.D. Ines Bloomer, M.D.  REFERRING PHYSICIAN:  Ines Bloomer, M.D.  DIAGNOSIS:  Small cell lung cancer.  HISTORY OF PRESENT ILLNESS:  [name] is a 61 year old female who states that she began noticing some respiratory changes including some wheezing.  She discussed this with her primary care physician and a chest x-ray was ordered.  There was a suspicion of a right suprahilar soft tissue mass and therefore the patient proceeded to undergo a CT scan.  This was completed on 09/09/2011 and this did confirm a 2.9 x 3.9 x 4.6 cm suprahilar soft tissue mass on the right.  There was some extension of the mass that appeared into the right mid and upper lobe pulmonary arteries and the right upper lobe bronchus was narrowed.  The patient has subsequently undergone an MRI scan of the brain and this was negative for any acute abnormality including apparent metastatic disease.  The patient has completed a PET scan in [facility] that showed no evidence for metastatic disease outside the known lung lesions.  The patient was seen by Dr. Edwyna Shell and he performed a fiberoptic bronchoscopy with endobronchial ultrasound.  Pathology from this returned positive for small cell carcinoma.  She is therefore seen in Multidisciplinary Oncology Clinic today for consideration of definitive radiotherapy, and her case was discussed at Thoracic Conference this morning.  She is also seeing Dr. Arbutus Ped as well for discussion of systemic treatment options.  PAST MEDICAL HISTORY:  Hypertension, history of anxiety, osteoporosis, hypercholesterolemia, status post cholecystectomy, status post tubal ligation.  CURRENT MEDICATIONS:  Xanax, Lipitor, Celexa, Zetia, Zestril, Spiriva, aspirin.  ALLERGIES:  Codeine and penicillin.  FAMILY HISTORY:  The patient's father suffered from bladder cancer and she has an aunt with a history of colon cancer.  SOCIAL HISTORY:  The patient is a  smoker or at least was until she recently quit in January of 2013.  She drinks alcohol on an occasional basis.  REVIEW OF SYSTEMS:  Negative other than as above with respect to some wheezing.  The patient denies any pain in the chest area or elsewhere.  PHYSICAL EXAM:  Weight 133 pounds, blood pressure 134/79, pulse 78, temperature 97.5, respiratory rate 20.  General:  Well-developed female in no acute distress, alert and oriented x3.  HEENT:  Normocephalic, atraumatic.  Extraocular movements intact.  Oral cavity clear.  Neck: Supple without any lymphadenopathy.  Cardiovascular:  Regular rate and rhythm.  Respiratory:  Clear to auscultation bilaterally.  GI:  Abdomen is soft, nontender with normal bowel sounds.  Extremities:  No edema present.  Neurologic:  No focal deficits.  IMPRESSION AND PLAN:  The patient is a pleasant 61 year old female with a diagnosis of limited stage small cell lung cancer.  I do recommend a course of definitive chemoradiotherapy and we discussed the details of a radiation course which would consist of approximately 6-6-1/2 weeks. This will be given concurrently with chemotherapy and Dr. Arbutus Ped is discussing this aspect of things with her today.  We did discuss the possible benefits, side effects and risks of such a treatment plan and all of her questions were answered.  I would like to begin her as soon as possible, especially given the apparent involvement of the pulmonary vasculature as seen on her imaging.  Therefore, the patient will be scheduled for a simulation as soon as possible although I do believe that she is going to start her chemotherapy early towards the beginning of next week, possibly  on Tuesday and therefore she will begin her overall treatment plan soon regardless of the radiation start date. Nevertheless, we will try to begin her as soon as possible.  I look forward to seeing her in our clinic next week to proceed with  treatment planning.    ______________________________ Radene Gunning, M.D., Ph.D. JSM/MEDQ  D:  09/29/2011  T:  09/30/2011  Job:  [id number]

## 2011-09-30 NOTE — Assessment & Plan Note (Signed)
[name] [name] is a 61 year old female patient of Dr. Arbutus Ped diagnosed with small-cell lung cancer.  MEDICAL HISTORY INCLUDES:  Hypertension, hypercholesterolemia, anxiety, osteoporosis, and tobacco usage.  She also has a history of chronic diarrhea.  MEDICATIONS INCLUDE:  Include Xanax, Lipitor, Celexa, Zetia.  LABS:  A sodium of 130.  HEIGHT:  63 inches. WEIGHT:  137.3 pounds. USUAL BODY WEIGHT:  144 pounds back in 2008. BMI:  24.33.  Patient is receiving her 1st chemotherapy today.  She was referred by the lung cancer navigator secondary to plans for concurrent chemoradiation therapy.  The patient was just educated today on her chemotherapy.  She does have a friend with her who has been one of her support systems and who is able to help clarify the information that she is receiving today.  NUTRITION DIAGNOSIS:  Food and nutrition-related knowledge deficit related to new diagnosis of small-cell lung cancer and associated treatments as evidenced by no prior need for nutrition-related information.  INTERVENTION:  I have educated the patient and her friend today on strategies for increasing oral intake throughout treatment to promote weight maintenance.  I have encouraged small, frequent meals with high-calorie, high-protein foods.  I educated her on the importance of how to eat the day of chemotherapy and the importance of increased fluids.  We have discussed the benefits of a low-fiber diet with pectin-containing foods to help with her chronic diarrhea.  I have educated her to eliminate lactose-containing foods at first to see if she notices any improvement in her diarrhea.  She is to receive additional information on Imodium from her physician and nursing.  I provided multiple fact sheets with my contact information for questions and concerns.  MONITORING/EVALUATION (GOALS):  That patient will tolerate increased oral intake to minimize side effects and promote weight maintenance throughout  treatment.  NEXT VISIT:  Tuesday, September 26th.    ______________________________ Zenovia Jarred, RD, LDN Clinical Nutrition Specialist BN/MEDQ  D:  09/30/2011  T:  09/30/2011  Job:  [id number]

## 2011-10-01 ENCOUNTER — Ambulatory Visit (HOSPITAL_BASED_OUTPATIENT_CLINIC_OR_DEPARTMENT_OTHER): Payer: BC Managed Care – PPO

## 2011-10-01 VITALS — BP 159/83 | HR 63 | Temp 97.0°F

## 2011-10-01 DIAGNOSIS — C3490 Malignant neoplasm of unspecified part of unspecified bronchus or lung: Secondary | ICD-10-CM

## 2011-10-01 DIAGNOSIS — Z5111 Encounter for antineoplastic chemotherapy: Secondary | ICD-10-CM

## 2011-10-01 MED ORDER — SODIUM CHLORIDE 0.9 % IJ SOLN
10.0000 mL | INTRAMUSCULAR | Status: DC | PRN
  Filled 2011-10-01: qty 10

## 2011-10-01 MED ORDER — SODIUM CHLORIDE 0.9 % IV SOLN
120.0000 mg/m2 | Freq: Once | INTRAVENOUS | Status: AC
  Administered 2011-10-01: 200 mg via INTRAVENOUS
  Filled 2011-10-01: qty 10

## 2011-10-01 MED ORDER — DEXAMETHASONE SODIUM PHOSPHATE 10 MG/ML IJ SOLN
10.0000 mg | Freq: Once | INTRAMUSCULAR | Status: AC
  Administered 2011-10-01: 10 mg via INTRAVENOUS

## 2011-10-01 MED ORDER — HEPARIN SOD (PORK) LOCK FLUSH 100 UNIT/ML IV SOLN
500.0000 [IU] | Freq: Once | INTRAVENOUS | Status: DC | PRN
  Filled 2011-10-01: qty 5

## 2011-10-01 MED ORDER — SODIUM CHLORIDE 0.9 % IV SOLN
Freq: Once | INTRAVENOUS | Status: AC
  Administered 2011-10-01: 17:00:00 via INTRAVENOUS

## 2011-10-02 ENCOUNTER — Ambulatory Visit (HOSPITAL_BASED_OUTPATIENT_CLINIC_OR_DEPARTMENT_OTHER): Payer: BC Managed Care – PPO

## 2011-10-02 VITALS — BP 146/76 | HR 65 | Temp 97.0°F

## 2011-10-02 DIAGNOSIS — Z5111 Encounter for antineoplastic chemotherapy: Secondary | ICD-10-CM

## 2011-10-02 DIAGNOSIS — C3410 Malignant neoplasm of upper lobe, unspecified bronchus or lung: Secondary | ICD-10-CM

## 2011-10-02 DIAGNOSIS — C3490 Malignant neoplasm of unspecified part of unspecified bronchus or lung: Secondary | ICD-10-CM

## 2011-10-02 MED ORDER — SODIUM CHLORIDE 0.9 % IV SOLN
Freq: Once | INTRAVENOUS | Status: AC
  Administered 2011-10-02: 16:00:00 via INTRAVENOUS

## 2011-10-02 MED ORDER — DEXAMETHASONE SODIUM PHOSPHATE 10 MG/ML IJ SOLN
10.0000 mg | Freq: Once | INTRAMUSCULAR | Status: AC
  Administered 2011-10-02: 10 mg via INTRAVENOUS

## 2011-10-02 MED ORDER — SODIUM CHLORIDE 0.9 % IV SOLN
120.0000 mg/m2 | Freq: Once | INTRAVENOUS | Status: AC
  Administered 2011-10-02: 200 mg via INTRAVENOUS
  Filled 2011-10-02: qty 10

## 2011-10-02 NOTE — Patient Instructions (Signed)
[facility] Discharge Instructions for Patients Receiving Chemotherapy  Today you received the following chemotherapy agent: Etoposide  To help prevent nausea and vomiting after your treatment, we encourage you to take your nausea medication, Compazine 10 mg every 6 hours as needed Begin taking it at bedtime and take it as often as prescribed as needed.   If you develop nausea and vomiting that is not controlled by your nausea medication, call the clinic. If it is after clinic hours your family physician or the after hours number for the clinic or go to the Emergency Department.   BELOW ARE SYMPTOMS THAT SHOULD BE REPORTED IMMEDIATELY:  *FEVER GREATER THAN 100.5 F  *CHILLS WITH OR WITHOUT FEVER  NAUSEA AND VOMITING THAT IS NOT CONTROLLED WITH YOUR NAUSEA MEDICATION  *UNUSUAL SHORTNESS OF BREATH  *UNUSUAL BRUISING OR BLEEDING  TENDERNESS IN MOUTH AND THROAT WITH OR WITHOUT PRESENCE OF ULCERS  *URINARY PROBLEMS  *BOWEL PROBLEMS  UNUSUAL RASH Items with * indicate a potential emergency and should be followed up as soon as possible.  Continue to push po fluids.   Feel free to call the clinic you have any questions or concerns. The clinic phone number is [phone].  I have been informed and understand all the instructions given to me. I know to contact the clinic, my physician, or go to the Emergency Department if any problems should occur. I do not have any questions at this time, but understand that I may call the clinic during office hours   should I have any questions or need assistance in obtaining follow up care.      __________________________________________  _____________  __________ Signature of Patient or Authorized Representative            Date                   Time    __________________________________________ Nurse's Signature

## 2011-10-02 NOTE — Progress Notes (Signed)
DIAGNOSIS:  Lung cancer, small cell carcinoma.  NARRATIVE:  [name] presented for simulation for her upcoming course of thoracic radiotherapy.  The patient was placed in a supine position with her arms raised above her using a customized wing board device.  In this fashion, a CT scan was obtained through the chest region, and an isocenter was placed within the right suprahilar region. This wing board which represents a simple treatment device will be used on a daily basis during her course of radiation.  [name] initially will receive a course of radiation corresponding to 60 Gy in 30 fractions at 2 Gy per fraction.  To accomplish this initial treatment, a total of five customized blocks have been designed.  The gross tumor volume has been contoured in addition to the spinal cord, heart, lungs and esophagus.  The dose- volume histograms of each of these structures will be carefully reviewed corresponding to the 3D conformal treatment planning process.  A 3D conformal plan is necessary to ensure that the gross tumor volume is covered in a satisfactory manner with radiation dose while minimizing to an acceptable level the radiation dose to the surrounding critical normal structures which are close to the gross tumor volume target.  It is anticipated that the patient will then receive a 6-10 Gy boost to the right suprahilar region to yield a final total dose of 66-70 Gy.  [name] will receive concurrent chemotherapy during this treatment.  This may cause increased toxicity, and she will be monitored closely for such side effects.  This may include extra lab work as necessary.  This, therefore, constitutes a special treatment procedure.    ______________________________ [name] [name], M.D., Ph.D. JSM/MEDQ  D:  09/30/2011  T:  10/02/2011  Job:  [id number]

## 2011-10-03 ENCOUNTER — Telehealth: Payer: Self-pay | Admitting: *Deleted

## 2011-10-03 ENCOUNTER — Ambulatory Visit (HOSPITAL_BASED_OUTPATIENT_CLINIC_OR_DEPARTMENT_OTHER): Payer: BC Managed Care – PPO

## 2011-10-03 VITALS — BP 166/88 | HR 70 | Temp 98.1°F

## 2011-10-03 DIAGNOSIS — C3490 Malignant neoplasm of unspecified part of unspecified bronchus or lung: Secondary | ICD-10-CM

## 2011-10-03 DIAGNOSIS — Z5189 Encounter for other specified aftercare: Secondary | ICD-10-CM

## 2011-10-03 MED ORDER — PEGFILGRASTIM INJECTION 6 MG/0.6ML
6.0000 mg | Freq: Once | SUBCUTANEOUS | Status: AC
  Administered 2011-10-03: 6 mg via SUBCUTANEOUS
  Filled 2011-10-03: qty 0.6

## 2011-10-03 NOTE — Telephone Encounter (Signed)
Called Patient and she is doing well.  Reports she is happy and people are bringing her food and homemade soups.  Reports she's gained 11 lbs in a week weighing 134.3 at home and now weighing 145.4 today.  Denies swelling, "skin feels tight because I need to put cream on it.  Expressed that the scale at home she weighs naked and here she weighs with clothes on.  These are different scales so she may ask injection nurse to weigh her later today.  On 09-29-11 at [facility] she weighed 137.3 lbs.  Reports drinking more than the 64 oz required water.  No nausea or vomiting.  Small bm yesterday but none so far today.

## 2011-10-07 ENCOUNTER — Ambulatory Visit
Admission: RE | Admit: 2011-10-07 | Discharge: 2011-10-07 | Disposition: A | Discharge status: Home / Self Care | Payer: BC Managed Care – PPO | Source: Ambulatory Visit | Attending: Radiation Oncology | Admitting: Radiation Oncology

## 2011-10-07 ENCOUNTER — Other Ambulatory Visit (HOSPITAL_BASED_OUTPATIENT_CLINIC_OR_DEPARTMENT_OTHER): Payer: BC Managed Care – PPO | Admitting: Lab

## 2011-10-07 ENCOUNTER — Ambulatory Visit (HOSPITAL_BASED_OUTPATIENT_CLINIC_OR_DEPARTMENT_OTHER): Payer: BC Managed Care – PPO | Admitting: Physician Assistant

## 2011-10-07 ENCOUNTER — Encounter: Payer: Self-pay | Admitting: Physician Assistant

## 2011-10-07 VITALS — BP 135/76 | HR 70 | Temp 97.1°F | Ht 63.0 in | Wt 135.7 lb

## 2011-10-07 DIAGNOSIS — R918 Other nonspecific abnormal finding of lung field: Secondary | ICD-10-CM

## 2011-10-07 DIAGNOSIS — C3490 Malignant neoplasm of unspecified part of unspecified bronchus or lung: Secondary | ICD-10-CM

## 2011-10-07 DIAGNOSIS — C3410 Malignant neoplasm of upper lobe, unspecified bronchus or lung: Secondary | ICD-10-CM

## 2011-10-07 LAB — CBC WITH DIFFERENTIAL/PLATELET
BASO%: 0.7 % (ref 0.0–2.0)
EOS%: 1.1 % (ref 0.0–7.0)
HCT: 36.5 % (ref 34.8–46.6)
LYMPH%: 18.4 % (ref 14.0–49.7)
MCH: 30.8 pg (ref 25.1–34.0)
MCHC: 34.2 g/dL (ref 31.5–36.0)
MCV: 89.9 fL (ref 79.5–101.0)
MONO%: 2.6 % (ref 0.0–14.0)
NEUT%: 77.2 % — ABNORMAL HIGH (ref 38.4–76.8)
Platelets: 130 10*3/uL — ABNORMAL LOW (ref 145–400)
RBC: 4.06 10*6/uL (ref 3.70–5.45)
WBC: 5.3 10*3/uL (ref 3.9–10.3)
nRBC: 0 % (ref 0–0)

## 2011-10-07 LAB — COMPREHENSIVE METABOLIC PANEL
ALT: 18 U/L (ref 0–35)
AST: 15 U/L (ref 0–37)
BUN: 18 mg/dL (ref 6–23)
Calcium: 9.4 mg/dL (ref 8.4–10.5)
Chloride: 97 mEq/L (ref 96–112)
Creatinine, Ser: 0.85 mg/dL (ref 0.50–1.10)
Total Bilirubin: 0.8 mg/dL (ref 0.3–1.2)

## 2011-10-08 ENCOUNTER — Ambulatory Visit
Admission: RE | Admit: 2011-10-08 | Discharge: 2011-10-08 | Disposition: A | Discharge status: Home / Self Care | Payer: BC Managed Care – PPO | Source: Ambulatory Visit | Attending: Radiation Oncology | Admitting: Radiation Oncology

## 2011-10-08 MED ORDER — BIAFINE EX EMUL: CUTANEOUS | Status: DC | PRN

## 2011-10-08 NOTE — Progress Notes (Signed)
No images are attached to the encounter. No scans are attached to the encounter. No scans are attached to the encounter. [facility] OFFICE PROGRESS NOTE  [name], MD, MD [phone] [facility] [address] [place] [address]  DIAGNOSIS: Limited stage small cell lung cancer  PRIOR THERAPY: None  CURRENT THERAPY: Systemic chemotherapy with cisplatin at 60 mg per meter square given on day 1 and etoposide at 120 mg per meter square given on days one 2 and 3 with Neulasta support given on day 4 status post 1 cycle.  INTERVAL HISTORY: [name] [name] 61 y.o. female returns for a scheduled regular symptom management visit for followup of her limited stage small cell lung cancer after receiving her first cycle of systemic chemotherapy with cisplatin and etoposide with Neulasta support. Overall she tolerated her first cycle of chemotherapy relatively well. She's had some intermittent dull headaches that are relieved with Tylenol. She did notice some blurred vision that occurred on Friday and Saturday. Her current contact prescription is approximately 61-year-old. She has a history of diarrhea but currently her bowels are regular. She did have some issues with a rash that developed after she change to the capsule so that was recommended in chemotherapy class. She previously used [employer] without difficulty. She is to begin her radiation therapy today. She is accompanied today by her sister [name] [name] and her ex-husband [name] [name]. She does report that she does not swallow pills well and would prefer on a liquid if one is available for her prescriptions.  MEDICAL HISTORY: Past Medical History  Diagnosis Date  . Hypertension   . High cholesterol   . Anxiety   . Osteoporosis   . Lung mass 09/19/11    small cell carcinoma  . Osteoporosis   . Lung cancer 09/19/11    SMALL CELL CARCINOMA    ALLERGIES:  is allergic to codeine and penicillins.  MEDICATIONS:  Current Outpatient  Prescriptions  Medication Sig Dispense Refill  . ALPRAZolam (XANAX) 0.5 MG tablet Take 0.25 mg by mouth 3 (three) times daily as needed. For anxiety.      Marland Kitchen aspirin 81 MG tablet Take 81 mg by mouth daily.       Marland Kitchen atorvastatin (LIPITOR) 80 MG tablet Take 80 mg by mouth daily.      Marland Kitchen atorvastatin (LIPITOR) 80 MG tablet TAKE 1 TABLET BY MOUTH EVERY DAY  30 tablet  0  . citalopram (CELEXA) 20 MG tablet Take 20 mg by mouth daily.      Marland Kitchen lisinopril (PRINIVIL,ZESTRIL) 20 MG tablet Take 20 mg by mouth daily.      . prochlorperazine (COMPAZINE) 10 MG tablet       . ZETIA 10 MG tablet TAKE ONE TABLET BY MOUTH ONCE DAILY  90 tablet  0  . tiotropium (SPIRIVA HANDIHALER) 18 MCG inhalation capsule Place 1 capsule (18 mcg total) into inhaler and inhale daily.  30 capsule  12   No current facility-administered medications for this visit.   Facility-Administered Medications Ordered in Other Visits  Medication Dose Route Frequency Provider Last Rate Last Dose  . topical emolient (BIAFINE) emulsion   Topical PRN Lurline Hare, MD        SURGICAL HISTORY:  Past Surgical History  Procedure Date  . Tubal ligation   . Cholecystectomy 04/2011    biliary stent placement    REVIEW OF SYSTEMS:  A comprehensive review of systems was negative except for: Integument/breast: positive for rash Neurological: positive  for headaches   PHYSICAL EXAMINATION: General appearance: alert, cooperative, appears stated age and no distress Head: Normocephalic, without obvious abnormality, atraumatic Neck: no adenopathy, no carotid bruit, no JVD, supple, symmetrical, trachea midline and thyroid not enlarged, symmetric, no tenderness/mass/nodules Lymph nodes: Cervical, supraclavicular, and axillary nodes normal. Resp: clear to auscultation bilaterally Cardio: regular rate and rhythm, S1, S2 normal, no murmur, click, rub or gallop GI: soft, non-tender; bowel sounds normal; no masses,  no organomegaly Extremities: extremities  normal, atraumatic, no cyanosis or edema Neurologic: Alert and oriented X 3, normal strength and tone. Normal symmetric reflexes. Normal coordination and gait Skin: mid-back reveals a few scattered raised minimally erythematous eruptions with excoriations- no evidence of super infection  ECOG PERFORMANCE STATUS: 0 - Asymptomatic  Blood pressure 135/76, pulse 70, temperature 97.1 F (36.2 C), temperature source Oral, height 5\' 3"  (1.6 m), weight 135 lb 11.2 oz (61.553 kg).  LABORATORY DATA: Lab Results  Component Value Date   WBC 5.3 10/07/2011   HGB 12.5 10/07/2011   HCT 36.5 10/07/2011   MCV 89.9 10/07/2011   PLT 130* 10/07/2011      Chemistry      Component Value Date/Time   NA 133* 10/07/2011 1109   K 5.5* 10/07/2011 1109   CL 97 10/07/2011 1109   CO2 27 10/07/2011 1109   BUN 18 10/07/2011 1109   CREATININE 0.85 10/07/2011 1109      Component Value Date/Time   CALCIUM 9.4 10/07/2011 1109   ALKPHOS 64 10/07/2011 1109   AST 15 10/07/2011 1109   ALT 18 10/07/2011 1109   BILITOT 0.8 10/07/2011 1109       RADIOGRAPHIC STUDIES:  Dg Chest 2 View Within Previous 72 Hours.  Films Obtained On Friday Are Acceptable For Monday And Tuesday Cases  09/17/2011  *RADIOLOGY REPORT*  Clinical Data: Preop right lung mass.  CHEST - 2 VIEW  Comparison: CT 09/09/2011 and the  Findings: Normal mediastinum and cardiac silhouette.  Right suprahilar mass is again demonstrated.  No effusion, infiltrate, or pneumothorax.  Lungs are hyperinflated.  IMPRESSION:  1.  No acute cardiopulmonary process. 2.  Hyperinflated lungs. 3.  Right hilar mass  Original Report Authenticated By: Genevive Bi, M.D.   Ct Chest W Contrast  09/09/2011  *RADIOLOGY REPORT*  Clinical Data: Abnormal chest x-ray with right suprahilar enlargement, smoker  CT CHEST WITH CONTRAST  Technique:  Multidetector CT imaging of the chest was performed following the standard protocol during bolus administration of intravenous contrast.  Contrast:  80mL OMNIPAQUE IOHEXOL 300 MG/ML IV SOLN  Comparison: Chest x-ray dated September 05, 2011  Findings: There is an abnormal right suprahilar soft tissue mass that measures 2.9 x 3.9 x 4.6 cm in the transverse, AP, longitudinal dimensions. There is extension of the mass into the right mid and upper lobe pulmonary arteries. The right upper lobe bronchus is narrowed.  No gross mediastinal, hilar, or axillary adenopathy are identified.  There are no osseous lesions.  The left lung is clear.  There are no satellite lesions within the right lung.  The visualized upper abdomen is unremarkable.  IMPRESSION: There is an abnormal right suprahilar soft tissue mass with extension into the pulmonary arteries and narrowing of the right upper lobe bronchus.  This was made a call report.  Original Report Authenticated By: Brandon Melnick, M.D.   Mr Laqueta Jean Wo Contrast  09/17/2011  *RADIOLOGY REPORT*  Clinical Data: Lung mass, possible lung cancer  MRI HEAD WITHOUT AND WITH CONTRAST  Technique:  Multiplanar, multiecho pulse sequences of the brain and surrounding structures were obtained according to standard protocol without and with intravenous contrast  Contrast: 12mL MULTIHANCE GADOBENATE DIMEGLUMINE 529 MG/ML IV SOLN  Comparison: None.  Findings: Ventricle size is normal.  Negative for acute infarction. Ill-defined hyperintensity in the right pons on T2 is not confirmed on any other sequences and may be an artifact. Alternatively, this could represent chronic ischemia.  Cerebral white matter is within normal limits.  Negative for demyelinating disease.  Negative for hemorrhage or mass.  Postcontrast imaging reveals no enhancing lesions.  Negative for metastatic disease.  Mucosal thickening in the left sphenoid sinus.  No air-fluid levels.  IMPRESSION: No acute abnormality and negative for metastatic disease to the brain.  Original Report Authenticated By: Camelia Phenes, M.D.     ASSESSMENT/PLAN: The patient is a  well-developed 61 year old white female with limited stage small cell lung cancer now status post one cycle of systemic chemotherapy with cisplatin and etoposide with Neulasta support. The patient was discussed with Dr. Arbutus Ped. She is tolerating her chemotherapy relatively well. Regarding her skin rash it is likely related to the change in soaks. She is advised to discontinue the use of the new soap and return to her date of that she previously tolerated. She will continue with weekly labs consisting of a CBC differential and C. met and return in 2 weeks prior to the start of cycle #2 with a repeat CBC differential C. met and magnesium. She'll proceed with her radiation therapy as scheduled.     Laural Benes, Marguetta Windish E, PA-C     All questions were answered. The patient knows to call the clinic with any problems, questions or concerns. We can certainly see the patient much sooner if necessary.

## 2011-10-08 NOTE — Progress Notes (Signed)
TEACHING DONE WITH PATIENT, SHE IS ACCOMPANIED BY SISTER.  THEY BOTH VERBALIZE UNDERSTANDING.  BOOKLET, RADIATION AND YO GIVEN TO PATIENT WITH PERTINENT INFO MARKED, ALSO BIAFINE WITH INSTRUCTIONS FOR USE.

## 2011-10-09 ENCOUNTER — Ambulatory Visit
Admission: RE | Admit: 2011-10-09 | Discharge: 2011-10-09 | Disposition: A | Discharge status: Home / Self Care | Payer: BC Managed Care – PPO | Source: Ambulatory Visit | Attending: Radiation Oncology | Admitting: Radiation Oncology

## 2011-10-09 MED ORDER — BIAFINE EX EMUL
CUTANEOUS | Status: DC | PRN
  Administered 2011-10-09: 1 via TOPICAL

## 2011-10-09 MED ORDER — BIAFINE EX EMUL: CUTANEOUS | Status: DC | PRN

## 2011-10-10 ENCOUNTER — Encounter: Payer: Self-pay | Admitting: Radiation Oncology

## 2011-10-10 ENCOUNTER — Ambulatory Visit
Admission: RE | Admit: 2011-10-10 | Discharge: 2011-10-10 | Disposition: A | Discharge status: Home / Self Care | Payer: BC Managed Care – PPO | Source: Ambulatory Visit | Attending: Radiation Oncology | Admitting: Radiation Oncology

## 2011-10-10 VITALS — Wt 131.7 lb

## 2011-10-10 DIAGNOSIS — C3410 Malignant neoplasm of upper lobe, unspecified bronchus or lung: Secondary | ICD-10-CM

## 2011-10-10 NOTE — Progress Notes (Signed)
Pt has slight headache today, will take Tylenol when she gets home. No other c/o.

## 2011-10-13 ENCOUNTER — Ambulatory Visit
Admission: RE | Admit: 2011-10-13 | Discharge: 2011-10-13 | Disposition: A | Discharge status: Home / Self Care | Payer: BC Managed Care – PPO | Source: Ambulatory Visit | Attending: Radiation Oncology | Admitting: Radiation Oncology

## 2011-10-13 ENCOUNTER — Telehealth: Payer: Self-pay | Admitting: Internal Medicine

## 2011-10-13 ENCOUNTER — Other Ambulatory Visit: Payer: Self-pay | Admitting: Internal Medicine

## 2011-10-13 MED ORDER — ALPRAZOLAM 0.5 MG PO TABS: 0.2500 mg | ORAL_TABLET | Freq: Three times a day (TID) | ORAL | Status: DC | PRN

## 2011-10-13 MED ORDER — CITALOPRAM HYDROBROMIDE 20 MG PO TABS: 20.0000 mg | ORAL_TABLET | Freq: Every day | ORAL | Status: DC

## 2011-10-13 NOTE — Telephone Encounter (Signed)
Xanax #90, no Rf Citalopram #30, 6 RF

## 2011-10-13 NOTE — Telephone Encounter (Signed)
OK to refill

## 2011-10-13 NOTE — Telephone Encounter (Signed)
Refill-citalopram 20mg  tablets. Take 1/2 tablet by mouth every day for 1 week, then take 1 tablet by mouth every day. Qty 30 last fill 1.17.13

## 2011-10-13 NOTE — Telephone Encounter (Signed)
Refill- alprazolam 0.5mg  tablets. Take one tablet by mouth four times daily as needed for anxiety.

## 2011-10-13 NOTE — Telephone Encounter (Signed)
Refill done.  

## 2011-10-14 ENCOUNTER — Other Ambulatory Visit (HOSPITAL_BASED_OUTPATIENT_CLINIC_OR_DEPARTMENT_OTHER): Payer: BC Managed Care – PPO | Admitting: Lab

## 2011-10-14 ENCOUNTER — Ambulatory Visit
Admission: RE | Admit: 2011-10-14 | Discharge: 2011-10-14 | Disposition: A | Discharge status: Home / Self Care | Payer: BC Managed Care – PPO | Source: Ambulatory Visit | Attending: Radiation Oncology | Admitting: Radiation Oncology

## 2011-10-14 ENCOUNTER — Other Ambulatory Visit: Payer: BC Managed Care – PPO

## 2011-10-14 DIAGNOSIS — C3490 Malignant neoplasm of unspecified part of unspecified bronchus or lung: Secondary | ICD-10-CM

## 2011-10-14 LAB — CBC WITH DIFFERENTIAL/PLATELET
BASO%: 0.4 % (ref 0.0–2.0)
Basophils Absolute: 0 10*3/uL (ref 0.0–0.1)
EOS%: 1.5 % (ref 0.0–7.0)
Eosinophils Absolute: 0.1 10*3/uL (ref 0.0–0.5)
HGB: 11.6 g/dL (ref 11.6–15.9)
MONO#: 0.5 10*3/uL (ref 0.1–0.9)
MONO%: 10.8 % (ref 0.0–14.0)
NEUT#: 2.9 10*3/uL (ref 1.5–6.5)
Platelets: 153 10*3/uL (ref 145–400)
RBC: 3.66 10*6/uL — ABNORMAL LOW (ref 3.70–5.45)
WBC: 4.4 10*3/uL (ref 3.9–10.3)

## 2011-10-14 LAB — COMPREHENSIVE METABOLIC PANEL
ALT: 19 U/L (ref 0–35)
AST: 21 U/L (ref 0–37)
Alkaline Phosphatase: 59 U/L (ref 39–117)
BUN: 10 mg/dL (ref 6–23)
Calcium: 9.4 mg/dL (ref 8.4–10.5)
Chloride: 98 mEq/L (ref 96–112)
Creatinine, Ser: 0.75 mg/dL (ref 0.50–1.10)

## 2011-10-15 ENCOUNTER — Other Ambulatory Visit: Payer: Self-pay | Admitting: *Deleted

## 2011-10-15 ENCOUNTER — Ambulatory Visit
Admission: RE | Admit: 2011-10-15 | Discharge: 2011-10-15 | Disposition: A | Discharge status: Home / Self Care | Payer: BC Managed Care – PPO | Source: Ambulatory Visit | Attending: Radiation Oncology | Admitting: Radiation Oncology

## 2011-10-15 DIAGNOSIS — C3490 Malignant neoplasm of unspecified part of unspecified bronchus or lung: Secondary | ICD-10-CM

## 2011-10-15 MED ORDER — OXYCODONE-ACETAMINOPHEN 5-325 MG PO TABS: 1.0000 | ORAL_TABLET | ORAL | Status: AC | PRN

## 2011-10-15 NOTE — Telephone Encounter (Signed)
Pt called stating that she had discussed with AJ about having h/a at night time and she states that last night h/a became worse and did not go away with Tylenol.  She stated at 0700 this morning she took celexa, 1/2 a xanax, and 2 tylenol and it finally went away.  Per AJ and Dr Donnald Garre, okay to give pt percocet 1 tab q4-6 h prn pain.  Pt also asked if celexa is the reason why she has these h/a.  Advised her to talk to Dr Leta Jungling office regarding the celexa.  SLJ

## 2011-10-16 ENCOUNTER — Encounter: Payer: Self-pay | Admitting: Radiation Oncology

## 2011-10-16 ENCOUNTER — Ambulatory Visit
Admission: RE | Admit: 2011-10-16 | Discharge: 2011-10-16 | Disposition: A | Discharge status: Home / Self Care | Payer: BC Managed Care – PPO | Source: Ambulatory Visit | Attending: Radiation Oncology | Admitting: Radiation Oncology

## 2011-10-16 VITALS — BP 145/85 | HR 81 | Resp 18 | Wt 133.5 lb

## 2011-10-16 DIAGNOSIS — C3410 Malignant neoplasm of upper lobe, unspecified bronchus or lung: Secondary | ICD-10-CM

## 2011-10-16 NOTE — Progress Notes (Signed)
Patient presents to the clinic today accompanied by her friend for an under treat visit with Dr. Mitzi Hansen. Patient is alert and oriented to person, place, and time. No distress noted. Steady gait noted. Pleasant affect noted. Patient denies pain at this time. Patient has no complaints. Reported all findings to Dr. Mitzi Hansen.

## 2011-10-16 NOTE — Progress Notes (Signed)
[facility] Radiation Oncology Weekly Treatment Note    Name: [name] [name] Date: 10/16/2011 MRN: [id number] DOB: 12/10/1950  Status: outpatient    Current dose: 800  Current fraction:4  Planned dose:6600  Planned fraction:33   MEDICATIONS: Current Outpatient Prescriptions  Medication Sig Dispense Refill  . ALPRAZolam (XANAX) 0.5 MG tablet Take 0.5 tablets (0.25 mg total) by mouth 3 (three) times daily as needed. For anxiety.  90 tablet  0  . aspirin 81 MG tablet Take 81 mg by mouth daily.       Marland Kitchen atorvastatin (LIPITOR) 80 MG tablet TAKE 1 TABLET BY MOUTH EVERY DAY  30 tablet  0  . citalopram (CELEXA) 20 MG tablet Take 1 tablet (20 mg total) by mouth daily.  30 tablet  6  . lisinopril (PRINIVIL,ZESTRIL) 20 MG tablet Take 20 mg by mouth daily.      Marland Kitchen oxyCODONE-acetaminophen (PERCOCET) 5-325 MG per tablet Take 1 tablet by mouth as needed for pain (every 4-6 hours PRN pain).  30 tablet  0  . prochlorperazine (COMPAZINE) 10 MG tablet       . ZETIA 10 MG tablet TAKE ONE TABLET BY MOUTH ONCE DAILY  90 tablet  0     ALLERGIES: Codeine and Penicillins   LABORATORY DATA:  Lab Results  Component Value Date   WBC 4.4 10/14/2011   HGB 11.6 10/14/2011   HCT 33.4* 10/14/2011   MCV 91.4 10/14/2011   PLT 153 10/14/2011   Lab Results  Component Value Date   NA 134* 10/14/2011   K 4.5 10/14/2011   CL 98 10/14/2011   CO2 27 10/14/2011   Lab Results  Component Value Date   ALT 19 10/14/2011   AST 21 10/14/2011   ALKPHOS 59 10/14/2011   BILITOT 0.1* 10/14/2011      NARRATIVE: [name] [name] was seen today for weekly treatment management. The chart was checked and CBCT images were reviewed. Pt doing very well in 1st week. No complaints.  PHYSICAL EXAMINATION: weight is 131 lb 11.2 oz (59.739 kg).        ASSESSMENT: Patient tolerating treatments well.    PLAN: Continue treatment as planned.

## 2011-10-17 ENCOUNTER — Ambulatory Visit
Admission: RE | Admit: 2011-10-17 | Discharge: 2011-10-17 | Disposition: A | Discharge status: Home / Self Care | Payer: BC Managed Care – PPO | Source: Ambulatory Visit | Attending: Radiation Oncology | Admitting: Radiation Oncology

## 2011-10-18 NOTE — Progress Notes (Signed)
[facility] Radiation Oncology Weekly Treatment Note    Name: [name] [name] Date: 10/18/2011 MRN: [id number] DOB: 07-31-1951  Status: outpatient    Current dose: 1600  Current fraction:8  Planned dose:6600  Planned fraction:33   MEDICATIONS: Current Outpatient Prescriptions  Medication Sig Dispense Refill  . ALPRAZolam (XANAX) 0.5 MG tablet Take 0.5 tablets (0.25 mg total) by mouth 3 (three) times daily as needed. For anxiety.  90 tablet  0  . aspirin 81 MG tablet Take 81 mg by mouth daily.       Marland Kitchen atorvastatin (LIPITOR) 80 MG tablet TAKE 1 TABLET BY MOUTH EVERY DAY  30 tablet  0  . citalopram (CELEXA) 20 MG tablet Take 1 tablet (20 mg total) by mouth daily.  30 tablet  6  . lisinopril (PRINIVIL,ZESTRIL) 20 MG tablet Take 20 mg by mouth daily.      Marland Kitchen oxyCODONE-acetaminophen (PERCOCET) 5-325 MG per tablet Take 1 tablet by mouth as needed for pain (every 4-6 hours PRN pain).  30 tablet  0  . prochlorperazine (COMPAZINE) 10 MG tablet       . ZETIA 10 MG tablet TAKE ONE TABLET BY MOUTH ONCE DAILY  90 tablet  0     ALLERGIES: Codeine and Penicillins   LABORATORY DATA:  Lab Results  Component Value Date   WBC 4.4 10/14/2011   HGB 11.6 10/14/2011   HCT 33.4* 10/14/2011   MCV 91.4 10/14/2011   PLT 153 10/14/2011   Lab Results  Component Value Date   NA 134* 10/14/2011   K 4.5 10/14/2011   CL 98 10/14/2011   CO2 27 10/14/2011   Lab Results  Component Value Date   ALT 19 10/14/2011   AST 21 10/14/2011   ALKPHOS 59 10/14/2011   BILITOT 0.1* 10/14/2011      NARRATIVE: [name] [name] was seen today for weekly treatment management. The chart was checked and CBCT images were reviewed. Doing well. No complaints.  PHYSICAL EXAMINATION: weight is 133 lb 8 oz (60.555 kg). Her blood pressure is 145/85 and her pulse is 81. Her respiration is 18.    Posterior skin looks fine   ASSESSMENT: Patient tolerating treatments well.    PLAN: Continue treatment  as planned.

## 2011-10-20 ENCOUNTER — Ambulatory Visit
Admission: RE | Admit: 2011-10-20 | Discharge: 2011-10-20 | Disposition: A | Discharge status: Home / Self Care | Payer: BC Managed Care – PPO | Source: Ambulatory Visit | Attending: Radiation Oncology | Admitting: Radiation Oncology

## 2011-10-20 ENCOUNTER — Other Ambulatory Visit: Payer: Self-pay | Admitting: Internal Medicine

## 2011-10-21 ENCOUNTER — Ambulatory Visit: Payer: BC Managed Care – PPO | Admitting: Nutrition

## 2011-10-21 ENCOUNTER — Other Ambulatory Visit: Payer: Self-pay | Admitting: Internal Medicine

## 2011-10-21 ENCOUNTER — Other Ambulatory Visit (HOSPITAL_BASED_OUTPATIENT_CLINIC_OR_DEPARTMENT_OTHER): Payer: BC Managed Care – PPO | Admitting: Lab

## 2011-10-21 ENCOUNTER — Ambulatory Visit
Admission: RE | Admit: 2011-10-21 | Discharge: 2011-10-21 | Disposition: A | Discharge status: Home / Self Care | Payer: BC Managed Care – PPO | Source: Ambulatory Visit | Attending: Radiation Oncology | Admitting: Radiation Oncology

## 2011-10-21 ENCOUNTER — Ambulatory Visit (HOSPITAL_BASED_OUTPATIENT_CLINIC_OR_DEPARTMENT_OTHER): Payer: BC Managed Care – PPO

## 2011-10-21 VITALS — BP 117/70 | HR 80 | Temp 97.2°F

## 2011-10-21 DIAGNOSIS — C3490 Malignant neoplasm of unspecified part of unspecified bronchus or lung: Secondary | ICD-10-CM

## 2011-10-21 DIAGNOSIS — Z5111 Encounter for antineoplastic chemotherapy: Secondary | ICD-10-CM

## 2011-10-21 LAB — CBC WITH DIFFERENTIAL/PLATELET
Basophils Absolute: 0.1 10*3/uL (ref 0.0–0.1)
EOS%: 0.7 % (ref 0.0–7.0)
Eosinophils Absolute: 0 10*3/uL (ref 0.0–0.5)
HCT: 33.1 % — ABNORMAL LOW (ref 34.8–46.6)
HGB: 11.5 g/dL — ABNORMAL LOW (ref 11.6–15.9)
LYMPH%: 15.3 % (ref 14.0–49.7)
MCH: 30.7 pg (ref 25.1–34.0)
MCV: 88.3 fL (ref 79.5–101.0)
MONO%: 11.7 % (ref 0.0–14.0)
NEUT#: 3.2 10*3/uL (ref 1.5–6.5)
NEUT%: 71.2 % (ref 38.4–76.8)
Platelets: 357 10*3/uL (ref 145–400)

## 2011-10-21 LAB — COMPREHENSIVE METABOLIC PANEL
Alkaline Phosphatase: 47 U/L (ref 39–117)
BUN: 14 mg/dL (ref 6–23)
CO2: 25 mEq/L (ref 19–32)
Glucose, Bld: 63 mg/dL — ABNORMAL LOW (ref 70–99)
Sodium: 137 mEq/L (ref 135–145)
Total Bilirubin: 0.2 mg/dL — ABNORMAL LOW (ref 0.3–1.2)
Total Protein: 6.4 g/dL (ref 6.0–8.3)

## 2011-10-21 MED ORDER — SODIUM CHLORIDE 0.9 % IV SOLN
60.0000 mg/m2 | Freq: Once | INTRAVENOUS | Status: AC
  Administered 2011-10-21: 98 mg via INTRAVENOUS
  Filled 2011-10-21: qty 98

## 2011-10-21 MED ORDER — SODIUM CHLORIDE 0.9 % IV SOLN
120.0000 mg/m2 | Freq: Once | INTRAVENOUS | Status: AC
  Administered 2011-10-21: 200 mg via INTRAVENOUS
  Filled 2011-10-21: qty 10

## 2011-10-21 MED ORDER — SODIUM CHLORIDE 0.9 % IV SOLN
150.0000 mg | Freq: Once | INTRAVENOUS | Status: AC
  Administered 2011-10-21: 150 mg via INTRAVENOUS
  Filled 2011-10-21: qty 5

## 2011-10-21 MED ORDER — POTASSIUM CHLORIDE 2 MEQ/ML IV SOLN
Freq: Once | INTRAVENOUS | Status: AC
  Administered 2011-10-21: 09:00:00 via INTRAVENOUS
  Filled 2011-10-21: qty 10

## 2011-10-21 MED ORDER — PALONOSETRON HCL INJECTION 0.25 MG/5ML
0.2500 mg | Freq: Once | INTRAVENOUS | Status: AC
  Administered 2011-10-21: 0.25 mg via INTRAVENOUS

## 2011-10-21 MED ORDER — DEXAMETHASONE SODIUM PHOSPHATE 4 MG/ML IJ SOLN
12.0000 mg | Freq: Once | INTRAMUSCULAR | Status: AC
  Administered 2011-10-21: 12 mg via INTRAVENOUS

## 2011-10-21 NOTE — Progress Notes (Signed)
Ms. Fleece reports that she is doing great.  She has cleaned up her diet and is eating lots of fruits and vegetables, and whole grains.  Her weight fluctuates between 131 and 135 pounds.  However, she reports that this is actually a few pounds heavier than her normal weight which has been in the 128 pound range.  The patient reports that she is very conscious of what she eats and tries not to eat sweet foods.  She reports her diarrhea has resolved and overall she just has been doing very well.  NUTRITION DIAGNOSIS:  Food and nutrition-related knowledge deficit has improved.  INTERVENTION:  I have educated the patient on the importance of continuing small frequent meals with adequate protein at each meal.  I have encouraged her to continue primarily a plant-based diet; however, focusing in on protein and then managing food intake based on any side effects she might be experiencing.  I stressed the importance of weight maintenance.  I have answered her questions.  MONITORING, EVALUATION, AND GOALS:  The patient has tolerated oral intake to minimize side effects and overall her weight is stable.  NEXT VISIT:  The patient would like to call me if she has questions or concerns.  She feels like she has adequate knowledge for now but would not hesitate to call me if she has nutritional issues.    ______________________________ Zenovia Jarred, RD, LDN Clinical Nutrition Specialist BN/MEDQ  D:  10/21/2011  T:  10/21/2011  Job:  [id number]

## 2011-10-22 ENCOUNTER — Ambulatory Visit (HOSPITAL_BASED_OUTPATIENT_CLINIC_OR_DEPARTMENT_OTHER): Payer: BC Managed Care – PPO

## 2011-10-22 ENCOUNTER — Other Ambulatory Visit: Payer: Self-pay | Admitting: Internal Medicine

## 2011-10-22 ENCOUNTER — Other Ambulatory Visit: Payer: Self-pay | Admitting: Physician Assistant

## 2011-10-22 ENCOUNTER — Encounter: Payer: Self-pay | Admitting: *Deleted

## 2011-10-22 ENCOUNTER — Ambulatory Visit
Admission: RE | Admit: 2011-10-22 | Discharge: 2011-10-22 | Disposition: A | Discharge status: Home / Self Care | Payer: BC Managed Care – PPO | Source: Ambulatory Visit | Attending: Radiation Oncology | Admitting: Radiation Oncology

## 2011-10-22 ENCOUNTER — Telehealth: Payer: Self-pay | Admitting: Internal Medicine

## 2011-10-22 ENCOUNTER — Encounter: Payer: Self-pay | Admitting: Physician Assistant

## 2011-10-22 VITALS — BP 133/74 | HR 91 | Temp 97.3°F

## 2011-10-22 DIAGNOSIS — R21 Rash and other nonspecific skin eruption: Secondary | ICD-10-CM

## 2011-10-22 DIAGNOSIS — Z5111 Encounter for antineoplastic chemotherapy: Secondary | ICD-10-CM

## 2011-10-22 DIAGNOSIS — C3490 Malignant neoplasm of unspecified part of unspecified bronchus or lung: Secondary | ICD-10-CM

## 2011-10-22 MED ORDER — SODIUM CHLORIDE 0.9 % IV SOLN
Freq: Once | INTRAVENOUS | Status: AC
  Administered 2011-10-22: 12:00:00 via INTRAVENOUS

## 2011-10-22 MED ORDER — DEXAMETHASONE SODIUM PHOSPHATE 10 MG/ML IJ SOLN
10.0000 mg | Freq: Once | INTRAMUSCULAR | Status: AC
  Administered 2011-10-22: 10 mg via INTRAVENOUS

## 2011-10-22 MED ORDER — METHYLPREDNISOLONE 4 MG PO KIT: PACK | ORAL | Status: AC

## 2011-10-22 MED ORDER — SODIUM CHLORIDE 0.9 % IV SOLN
120.0000 mg/m2 | Freq: Once | INTRAVENOUS | Status: AC
  Administered 2011-10-22: 200 mg via INTRAVENOUS
  Filled 2011-10-22: qty 10

## 2011-10-22 NOTE — Telephone Encounter (Signed)
Wants to clarify correct dose for steroid dose pack and can she start in am . Her rash is subsiding and she does not itch. I told her it was ok to start in am and to take steroid with food.

## 2011-10-22 NOTE — Patient Instructions (Signed)
Pt aware of future appts.  D/C'd with friend down to radiation.  SLJ

## 2011-10-22 NOTE — Progress Notes (Signed)
Spoke with pt at her chemo appt.  Pt c/o skin rash.  Noted rash on trunk and arms. Dr. Arbutus Ped aware and saw pt

## 2011-10-22 NOTE — Progress Notes (Signed)
Assessment showed red, hot rash on legs, arms, trunk.  Rash appeared this morning per patient.  Only slightly itchy at times.  No new rx's taken.  Forde Radon, PA-C and Dr Donnald Garre notified.  AJ at bedside to assess pt.  Dr Donnald Garre also at bedside to assess patient and Elnita Maxwell RN from radiation.  Per Dr Donnald Garre, okay to proceed with tx today, pt can take Clariten tonight and will be reassessed by AJ tomorrow 10/23/11.  Pt aware of plan.  No other complaints.  SLJ

## 2011-10-22 NOTE — Telephone Encounter (Signed)
I called pt and told her that Adrena wants her to take an antihistamine today -pt said she took one

## 2011-10-22 NOTE — Progress Notes (Signed)
Called to the infusion area to evaluate the patient for a generalized erythematous rash. Patient states that she awoke this morning to find that she was covered and a red rash that was periodic in nature at times. She denied taking any new medications, trying any new soaps or lotions. She denies ingesting any seafood or any odd foods. She had day 1 of cycle 2 of her systemic chemotherapy with cisplatin and etoposide yesterday. She took her Compazine and Xanax yesterday as per usual. Patient was seen and had an erythematous warm blanching rash covering her body with the greatest concentration on her anterior chest and upper back. There also similar erythematous blanching areas on her extremities as well as her face neck and ears and to a lesser extent her scalp. Patient was also evaluated by Sonda Rumble, RN of radiation oncology as well as Dr. Arbutus Ped. She was not in any acute distress, specifically no respiratory distress. Her lungs were clear and she had a regular rate and rhythm to her cardiac exam. Her speech was fluent. Dr. Arbutus Ped felt she could proceed with her second cycle of systemic chemotherapy with cisplatin and etoposide with Neulasta support as scheduled. A prescription for a Medrol Dosepak will be sent via [facility] to her pharmacy of record. She was also advised to take an antihistamine such as Claritin. She'll be seen as scheduled and followup on 10/23/2011 to report to the emergency room for further evaluation of her symptoms should become worse or she developed other concerning signs or symptoms.  Laural Benes, Juliene Kirsh E, PA-C

## 2011-10-23 ENCOUNTER — Ambulatory Visit: Payer: BC Managed Care – PPO | Admitting: Physician Assistant

## 2011-10-23 ENCOUNTER — Ambulatory Visit
Admission: RE | Admit: 2011-10-23 | Discharge: 2011-10-23 | Disposition: A | Discharge status: Home / Self Care | Payer: BC Managed Care – PPO | Source: Ambulatory Visit | Attending: Radiation Oncology | Admitting: Radiation Oncology

## 2011-10-23 ENCOUNTER — Ambulatory Visit (HOSPITAL_BASED_OUTPATIENT_CLINIC_OR_DEPARTMENT_OTHER): Payer: BC Managed Care – PPO

## 2011-10-23 ENCOUNTER — Encounter: Payer: Self-pay | Admitting: Radiation Oncology

## 2011-10-23 VITALS — BP 140/85 | HR 72 | Resp 18

## 2011-10-23 VITALS — BP 143/80 | HR 84 | Temp 97.7°F | Ht 63.0 in | Wt 143.3 lb

## 2011-10-23 DIAGNOSIS — Z5111 Encounter for antineoplastic chemotherapy: Secondary | ICD-10-CM

## 2011-10-23 DIAGNOSIS — C3490 Malignant neoplasm of unspecified part of unspecified bronchus or lung: Secondary | ICD-10-CM

## 2011-10-23 DIAGNOSIS — C3410 Malignant neoplasm of upper lobe, unspecified bronchus or lung: Secondary | ICD-10-CM

## 2011-10-23 MED ORDER — SODIUM CHLORIDE 0.9 % IV SOLN
Freq: Once | INTRAVENOUS | Status: AC
  Administered 2011-10-23: 16:00:00 via INTRAVENOUS

## 2011-10-23 MED ORDER — BIAFINE EX EMUL
Freq: Every day | CUTANEOUS | Status: DC
  Administered 2011-10-23: 17:00:00 via TOPICAL

## 2011-10-23 MED ORDER — SODIUM CHLORIDE 0.9 % IV SOLN
120.0000 mg/m2 | Freq: Once | INTRAVENOUS | Status: AC
  Administered 2011-10-23: 200 mg via INTRAVENOUS
  Filled 2011-10-23: qty 10

## 2011-10-23 MED ORDER — DEXAMETHASONE SODIUM PHOSPHATE 10 MG/ML IJ SOLN
10.0000 mg | Freq: Once | INTRAMUSCULAR | Status: AC
  Administered 2011-10-23: 10 mg via INTRAVENOUS

## 2011-10-23 MED ORDER — SUCRALFATE 1 G PO TABS: 1.0000 g | ORAL_TABLET | Freq: Four times a day (QID) | ORAL | Status: DC

## 2011-10-23 NOTE — Progress Notes (Signed)
Patient presents to the clinic today accompanied by her friend for an under treat visit with Dr. Mitzi Hansen. Patient is alert and oriented to person, place, and time. No distress noted. Steady gait noted. Pleasant affect noted. Chemotherapy infusing without problems. Patient reports pain when she swallows and difficulty swallowing. Esophagitis noted. No thrush seen. Patient questions if she should pick something up OTC for reflux. Patient reports an infrequent dry cough x2 weeks. Reported all findings to Dr. Mitzi Hansen.

## 2011-10-24 ENCOUNTER — Ambulatory Visit
Admission: RE | Admit: 2011-10-24 | Discharge: 2011-10-24 | Disposition: A | Discharge status: Home / Self Care | Payer: BC Managed Care – PPO | Source: Ambulatory Visit | Attending: Radiation Oncology | Admitting: Radiation Oncology

## 2011-10-24 ENCOUNTER — Ambulatory Visit (HOSPITAL_BASED_OUTPATIENT_CLINIC_OR_DEPARTMENT_OTHER): Payer: BC Managed Care – PPO

## 2011-10-24 VITALS — BP 157/85 | HR 80 | Temp 97.6°F

## 2011-10-24 DIAGNOSIS — C3490 Malignant neoplasm of unspecified part of unspecified bronchus or lung: Secondary | ICD-10-CM

## 2011-10-24 DIAGNOSIS — Z5189 Encounter for other specified aftercare: Secondary | ICD-10-CM

## 2011-10-24 MED ORDER — PEGFILGRASTIM INJECTION 6 MG/0.6ML
6.0000 mg | Freq: Once | SUBCUTANEOUS | Status: AC
  Administered 2011-10-24: 6 mg via SUBCUTANEOUS
  Filled 2011-10-24: qty 0.6

## 2011-10-24 NOTE — Progress Notes (Signed)
[facility] Radiation Oncology Weekly Treatment Note    Name: [name] [name] Date: 10/24/2011 MRN: [id number] DOB: [birth year]  Status: outpatient    Current dose: 2600  Current fraction:13  Planned dose:6600  Planned fraction:33   MEDICATIONS: Current Outpatient Prescriptions  Medication Sig Dispense Refill  . ALPRAZolam (XANAX) 0.5 MG tablet Take 0.5 tablets (0.25 mg total) by mouth 3 (three) times daily as needed. For anxiety.  90 tablet  0  . aspirin 81 MG tablet Take 81 mg by mouth daily.       Marland Kitchen atorvastatin (LIPITOR) 80 MG tablet TAKE 1 TABLET BY MOUTH EVERY DAY  30 tablet  0  . citalopram (CELEXA) 20 MG tablet Take 1 tablet (20 mg total) by mouth daily.  30 tablet  6  . lisinopril (PRINIVIL,ZESTRIL) 20 MG tablet Take 20 mg by mouth daily.      . methylPREDNISolone (MEDROL, PAK,) 4 MG tablet follow package directions  21 tablet  0  . oxyCODONE-acetaminophen (PERCOCET) 5-325 MG per tablet Take 1 tablet by mouth as needed for pain (every 4-6 hours PRN pain).  30 tablet  0  . prochlorperazine (COMPAZINE) 10 MG tablet       . ZETIA 10 MG tablet TAKE ONE TABLET BY MOUTH ONCE DAILY  90 tablet  0  . sucralfate (CARAFATE) 1 G tablet Take 1 tablet (1 g total) by mouth 4 (four) times daily. Dissolve tablet in 15cc water.  120 tablet  1   Current Facility-Administered Medications  Medication Dose Route Frequency Provider Last Rate Last Dose  . DISCONTD: topical emolient (BIAFINE) emulsion   Topical Daily Jonna Coup, MD       Facility-Administered Medications Ordered in Other Encounters  Medication Dose Route Frequency Provider Last Rate Last Dose  . 0.9 %  sodium chloride infusion   Intravenous Once Mohamed K. Mohamed, MD      . dexamethasone (DECADRON) injection 10 mg  10 mg Intravenous Once Mohamed K. Mohamed, MD   10 mg at 10/23/11 1538  . etoposide (VEPESID) 200 mg in sodium chloride 0.9 % 500 mL chemo infusion  120 mg/m2 (Treatment Plan Actual)  Intravenous Once Mohamed K. Mohamed, MD   200 mg at 10/23/11 1650     ALLERGIES: Codeine and Penicillins   LABORATORY DATA:  Lab Results  Component Value Date   WBC 4.5 10/21/2011   HGB 11.5* 10/21/2011   HCT 33.1* 10/21/2011   MCV 88.3 10/21/2011   PLT 357 10/21/2011   Lab Results  Component Value Date   NA 137 10/21/2011   K 4.2 10/21/2011   CL 102 10/21/2011   CO2 25 10/21/2011   Lab Results  Component Value Date   ALT 20 10/21/2011   AST 22 10/21/2011   ALKPHOS 47 10/21/2011   BILITOT 0.2* 10/21/2011      NARRATIVE: [name] [name] was seen today for weekly treatment management. The chart was checked and CBCT images were reviewed. The patient overall is doing well with treatment. She is complaining of esophagitis today which is her primary complaint.  PHYSICAL EXAMINATION: blood pressure is 140/85 and her pulse is 72. Her respiration is 18.       ASSESSMENT: Patient tolerating treatments well.    PLAN: Continue treatment as planned. The patient will begin Prilosec. She also has been given a prescription for Carafate.

## 2011-10-26 NOTE — Progress Notes (Signed)
No images are attached to the encounter. No scans are attached to the encounter. No scans are attached to the encounter. [facility] OFFICE PROGRESS NOTE  [name], MD, MD [phone] [address]. [facility] [address] [place] [address]  DIAGNOSIS: Limited stage small cell lung cancer  PRIOR THERAPY: None  CURRENT THERAPY: Systemic chemotherapy with cisplatin at 60 mg per meter square given on day 1 and etoposide at 120 mg per meter square given on days one 2 and 3 with Neulasta support given on day 4 status post 1 cycle and Day 2 of Cycle 2.  INTERVAL HISTORY: [name] [name] 61 y.o. female returns for a scheduled regular visit for followup of her limited stage small cell lung cancer. Her rash from yesterday has completely resolved. Today she complains of some discomfort with swallowing. She voices no other complaints. MEDICAL HISTORY: Past Medical History  Diagnosis Date  . Hypertension   . High cholesterol   . Anxiety   . Osteoporosis   . Lung mass 09/19/11    small cell carcinoma  . Osteoporosis   . Lung cancer 09/19/11    SMALL CELL CARCINOMA    ALLERGIES:  is allergic to codeine and penicillins.  MEDICATIONS:  Current Outpatient Prescriptions  Medication Sig Dispense Refill  . ALPRAZolam (XANAX) 0.5 MG tablet Take 0.5 tablets (0.25 mg total) by mouth 3 (three) times daily as needed. For anxiety.  90 tablet  0  . aspirin 81 MG tablet Take 81 mg by mouth daily.       Marland Kitchen atorvastatin (LIPITOR) 80 MG tablet TAKE 1 TABLET BY MOUTH EVERY DAY  30 tablet  0  . citalopram (CELEXA) 20 MG tablet Take 1 tablet (20 mg total) by mouth daily.  30 tablet  6  . lisinopril (PRINIVIL,ZESTRIL) 20 MG tablet Take 20 mg by mouth daily.      . methylPREDNISolone (MEDROL, PAK,) 4 MG tablet follow package directions  21 tablet  0  . oxyCODONE-acetaminophen (PERCOCET) 5-325 MG per tablet Take 1 tablet by mouth as needed for pain (every 4-6 hours PRN pain).  30 tablet  0  .  prochlorperazine (COMPAZINE) 10 MG tablet       . sucralfate (CARAFATE) 1 G tablet Take 1 tablet (1 g total) by mouth 4 (four) times daily. Dissolve tablet in 15cc water.  120 tablet  1  . ZETIA 10 MG tablet TAKE ONE TABLET BY MOUTH ONCE DAILY  90 tablet  0    SURGICAL HISTORY:  Past Surgical History  Procedure Date  . Tubal ligation   . Cholecystectomy 04/2011    biliary stent placement    REVIEW OF SYSTEMS:  A comprehensive review of systems was negative except for: Ears, nose, mouth, throat, and face: positive for discomfort with swallowing Integument/breast: positive for rash   PHYSICAL EXAMINATION: General appearance: alert, cooperative, appears stated age and no distress Head: Normocephalic, without obvious abnormality, atraumatic Neck: no adenopathy, no carotid bruit, no JVD, supple, symmetrical, trachea midline and thyroid not enlarged, symmetric, no tenderness/mass/nodules Lymph nodes: Cervical, supraclavicular, and axillary nodes normal. Resp: clear to auscultation bilaterally Cardio: regular rate and rhythm, S1, S2 normal, no murmur, click, rub or gallop GI: soft, non-tender; bowel sounds normal; no masses,  no organomegaly Extremities: extremities normal, atraumatic, no cyanosis or edema Neurologic: Alert and oriented X 3, normal strength and tone. Normal symmetric reflexes. Normal coordination and gait Skin: faint erythema on anterior chest and upper back  ECOG PERFORMANCE  STATUS: 0 - Asymptomatic  Blood pressure 143/80, pulse 84, temperature 97.7 F (36.5 C), temperature source Oral, height 5\' 3"  (1.6 m), weight 143 lb 4.8 oz (65 kg).  LABORATORY DATA: Lab Results  Component Value Date   WBC 4.5 10/21/2011   HGB 11.5* 10/21/2011   HCT 33.1* 10/21/2011   MCV 88.3 10/21/2011   PLT 357 10/21/2011      Chemistry      Component Value Date/Time   NA 137 10/21/2011 0807   K 4.2 10/21/2011 0807   CL 102 10/21/2011 0807   CO2 25 10/21/2011 0807   BUN 14 10/21/2011 0807    CREATININE 0.70 10/21/2011 0807      Component Value Date/Time   CALCIUM 9.5 10/21/2011 0807   ALKPHOS 47 10/21/2011 0807   AST 22 10/21/2011 0807   ALT 20 10/21/2011 0807   BILITOT 0.2* 10/21/2011 0807       RADIOGRAPHIC STUDIES:  Dg Chest 2 View Within Previous 72 Hours.  Films Obtained On Friday Are Acceptable For Monday And Tuesday Cases  09/17/2011  *RADIOLOGY REPORT*  Clinical Data: Preop right lung mass.  CHEST - 2 VIEW  Comparison: CT 09/09/2011 and the  Findings: Normal mediastinum and cardiac silhouette.  Right suprahilar mass is again demonstrated.  No effusion, infiltrate, or pneumothorax.  Lungs are hyperinflated.  IMPRESSION:  1.  No acute cardiopulmonary process. 2.  Hyperinflated lungs. 3.  Right hilar mass  Original Report Authenticated By: Genevive Bi, M.D.   Ct Chest W Contrast  09/09/2011  *RADIOLOGY REPORT*  Clinical Data: Abnormal chest x-ray with right suprahilar enlargement, smoker  CT CHEST WITH CONTRAST  Technique:  Multidetector CT imaging of the chest was performed following the standard protocol during bolus administration of intravenous contrast.  Contrast: 80mL OMNIPAQUE IOHEXOL 300 MG/ML IV SOLN  Comparison: Chest x-ray dated September 05, 2011  Findings: There is an abnormal right suprahilar soft tissue mass that measures 2.9 x 3.9 x 4.6 cm in the transverse, AP, longitudinal dimensions. There is extension of the mass into the right mid and upper lobe pulmonary arteries. The right upper lobe bronchus is narrowed.  No gross mediastinal, hilar, or axillary adenopathy are identified.  There are no osseous lesions.  The left lung is clear.  There are no satellite lesions within the right lung.  The visualized upper abdomen is unremarkable.  IMPRESSION: There is an abnormal right suprahilar soft tissue mass with extension into the pulmonary arteries and narrowing of the right upper lobe bronchus.  This was made a call report.  Original Report Authenticated By: Brandon Melnick, M.D.   Mr Laqueta Jean Wo Contrast  09/17/2011  *RADIOLOGY REPORT*  Clinical Data: Lung mass, possible lung cancer  MRI HEAD WITHOUT AND WITH CONTRAST  Technique:  Multiplanar, multiecho pulse sequences of the brain and surrounding structures were obtained according to standard protocol without and with intravenous contrast  Contrast: 12mL MULTIHANCE GADOBENATE DIMEGLUMINE 529 MG/ML IV SOLN  Comparison: None.  Findings: Ventricle size is normal.  Negative for acute infarction. Ill-defined hyperintensity in the right pons on T2 is not confirmed on any other sequences and may be an artifact. Alternatively, this could represent chronic ischemia.  Cerebral white matter is within normal limits.  Negative for demyelinating disease.  Negative for hemorrhage or mass.  Postcontrast imaging reveals no enhancing lesions.  Negative for metastatic disease.  Mucosal thickening in the left sphenoid sinus.  No air-fluid levels.  IMPRESSION: No acute abnormality and negative for  metastatic disease to the brain.  Original Report Authenticated By: Camelia Phenes, M.D.     ASSESSMENT/PLAN: The patient is a well-developed 61 year old white female with limited stage small cell lung cancer now status post one cycle of systemic chemotherapy with cisplatin and etoposide with Neulasta support.  She is tolerating her chemotherapy relatively well.She is advised to complete her Medrol dose pak as prescribed. tolerated. She will continue with weekly labs consisting of a CBC differential and C. met and return in 3 weeks prior to the start of cycle #3 with a repeat CBC differential C. met and magnesium and a CT of the Chest with contrast to reevaluate her disease.. She'll proceed with her radiation therapy as scheduled. She will discuss her discomfort with swallowing with radiation oncology.     Laural Benes, Rhenda Oregon E, PA-C     All questions were answered. The patient knows to call the clinic with any problems, questions or concerns.  We can certainly see the patient much sooner if necessary.

## 2011-10-27 ENCOUNTER — Ambulatory Visit: Payer: BC Managed Care – PPO

## 2011-10-27 ENCOUNTER — Ambulatory Visit
Admission: RE | Admit: 2011-10-27 | Discharge: 2011-10-27 | Disposition: A | Discharge status: Home / Self Care | Payer: BC Managed Care – PPO | Source: Ambulatory Visit | Attending: Radiation Oncology | Admitting: Radiation Oncology

## 2011-10-28 ENCOUNTER — Other Ambulatory Visit (HOSPITAL_BASED_OUTPATIENT_CLINIC_OR_DEPARTMENT_OTHER): Payer: BC Managed Care – PPO

## 2011-10-28 ENCOUNTER — Ambulatory Visit
Admission: RE | Admit: 2011-10-28 | Discharge: 2011-10-28 | Disposition: A | Discharge status: Home / Self Care | Payer: BC Managed Care – PPO | Source: Ambulatory Visit | Attending: Radiation Oncology | Admitting: Radiation Oncology

## 2011-10-28 DIAGNOSIS — C3490 Malignant neoplasm of unspecified part of unspecified bronchus or lung: Secondary | ICD-10-CM

## 2011-10-28 LAB — COMPREHENSIVE METABOLIC PANEL
ALT: 22 U/L (ref 0–35)
Albumin: 3.9 g/dL (ref 3.5–5.2)
Alkaline Phosphatase: 70 U/L (ref 39–117)
Glucose, Bld: 90 mg/dL (ref 70–99)
Potassium: 4.1 mEq/L (ref 3.5–5.3)
Sodium: 134 mEq/L — ABNORMAL LOW (ref 135–145)
Total Bilirubin: 0.2 mg/dL — ABNORMAL LOW (ref 0.3–1.2)
Total Protein: 6.9 g/dL (ref 6.0–8.3)

## 2011-10-28 LAB — CBC WITH DIFFERENTIAL/PLATELET
BASO%: 0.2 % (ref 0.0–2.0)
Eosinophils Absolute: 0 10*3/uL (ref 0.0–0.5)
LYMPH%: 10.7 % — ABNORMAL LOW (ref 14.0–49.7)
MCHC: 33.7 g/dL (ref 31.5–36.0)
MCV: 92.2 fL (ref 79.5–101.0)
MONO#: 0.2 10*3/uL (ref 0.1–0.9)
MONO%: 2.9 % (ref 0.0–14.0)
NEUT#: 6.5 10*3/uL (ref 1.5–6.5)
RBC: 3.66 10*6/uL — ABNORMAL LOW (ref 3.70–5.45)
RDW: 12.6 % (ref 11.2–14.5)
WBC: 7.6 10*3/uL (ref 3.9–10.3)

## 2011-10-29 ENCOUNTER — Ambulatory Visit
Admission: RE | Admit: 2011-10-29 | Discharge: 2011-10-29 | Disposition: A | Discharge status: Home / Self Care | Payer: BC Managed Care – PPO | Source: Ambulatory Visit | Attending: Radiation Oncology | Admitting: Radiation Oncology

## 2011-10-30 ENCOUNTER — Ambulatory Visit
Admission: RE | Admit: 2011-10-30 | Discharge: 2011-10-30 | Disposition: A | Discharge status: Home / Self Care | Payer: BC Managed Care – PPO | Source: Ambulatory Visit | Attending: Radiation Oncology | Admitting: Radiation Oncology

## 2011-10-30 ENCOUNTER — Other Ambulatory Visit: Payer: Self-pay | Admitting: Internal Medicine

## 2011-10-30 NOTE — Telephone Encounter (Signed)
Refill done.  

## 2011-10-31 ENCOUNTER — Ambulatory Visit
Admission: RE | Admit: 2011-10-31 | Discharge: 2011-10-31 | Disposition: A | Discharge status: Home / Self Care | Payer: BC Managed Care – PPO | Source: Ambulatory Visit | Attending: Radiation Oncology | Admitting: Radiation Oncology

## 2011-10-31 DIAGNOSIS — C3410 Malignant neoplasm of upper lobe, unspecified bronchus or lung: Secondary | ICD-10-CM

## 2011-10-31 DIAGNOSIS — R918 Other nonspecific abnormal finding of lung field: Secondary | ICD-10-CM

## 2011-10-31 MED ORDER — BIAFINE EX EMUL
CUTANEOUS | Status: DC | PRN
  Administered 2011-10-31: 1 via TOPICAL

## 2011-10-31 NOTE — Progress Notes (Signed)
[facility] Radiation Oncology Weekly Treatment Note    Name: [name] [name] Date: 10/31/2011 MRN: [id number] DOB: 12/10/1950  Status: outpatient    Current dose: 38 gray  Current fraction: 19  Planned dose: 66 gray  Planned fraction: 33   MEDICATIONS: Current Outpatient Prescriptions  Medication Sig Dispense Refill  . ALPRAZolam (XANAX) 0.5 MG tablet Take 0.5 tablets (0.25 mg total) by mouth 3 (three) times daily as needed. For anxiety.  90 tablet  0  . aspirin 81 MG tablet Take 81 mg by mouth daily.       Marland Kitchen atorvastatin (LIPITOR) 80 MG tablet TAKE 1 TABLET BY MOUTH EVERY DAY  30 tablet  0  . citalopram (CELEXA) 20 MG tablet Take 1 tablet (20 mg total) by mouth daily.  30 tablet  6  . lisinopril (PRINIVIL,ZESTRIL) 20 MG tablet Take 20 mg by mouth daily.      Marland Kitchen lisinopril (PRINIVIL,ZESTRIL) 20 MG tablet TAKE 1 TABLET BY MOUTH ONCE DAILY  90 tablet  3  . prochlorperazine (COMPAZINE) 10 MG tablet       . sucralfate (CARAFATE) 1 G tablet Take 1 tablet (1 g total) by mouth 4 (four) times daily. Dissolve tablet in 15cc water.  120 tablet  1  . ZETIA 10 MG tablet TAKE ONE TABLET BY MOUTH ONCE DAILY  90 tablet  0   Current Facility-Administered Medications  Medication Dose Route Frequency Provider Last Rate Last Dose  . topical emolient (BIAFINE) emulsion   Topical PRN Jonna Coup, MD   1 application at 10/31/11 1726     ALLERGIES: Codeine and Penicillins   LABORATORY DATA:  Lab Results  Component Value Date   WBC 7.6 10/28/2011   HGB 11.4* 10/28/2011   HCT 33.7* 10/28/2011   MCV 92.2 10/28/2011   PLT 169 10/28/2011   Lab Results  Component Value Date   NA 134* 10/28/2011   K 4.1 10/28/2011   CL 99 10/28/2011   CO2 26 10/28/2011   Lab Results  Component Value Date   ALT 22 10/28/2011   AST 16 10/28/2011   ALKPHOS 70 10/28/2011   BILITOT 0.2* 10/28/2011      NARRATIVE: [name] [name] was seen today for weekly treatment management. The chart was  checked and CBCT images were reviewed. The patient is complaining of increased esophagitis. She has been using Prilosec and Carafate. She does have a prescription for Percocet but has not begun this due to some nausea and vomiting which she has previously experienced with pain medication.  PHYSICAL EXAMINATION: vitals were not taken for this visit.   the patient is alert and oriented x3. Temperature 97.3. Respiratory rate 18   ASSESSMENT: Patient tolerating treatments well. She is having some esophagitis.   PLAN: Continue treatment as planned. The patient will fill her prescription for Percocet. If she experiences nausea she will take her nausea medication. Hopefully this will be well tolerated. She is to let us know she has problems.

## 2011-10-31 NOTE — Progress Notes (Signed)
C/O SORE THROAT AND HAS HARD TIME SWALLOWING EVEN USING CARAFATE. SHE WANTS TO TRY LORTAB FOR THE PAIN.  SKIN LOOKS PINK IN COLOR WITH NO DESQUAMATION   STILL HAVING TROUBLE WITH REFLUX           VS...Marland KitchenMarland KitchenT  97.3     BP   103/70      P   95      R    18

## 2011-11-03 ENCOUNTER — Ambulatory Visit
Admission: RE | Admit: 2011-11-03 | Discharge: 2011-11-03 | Disposition: A | Discharge status: Home / Self Care | Payer: BC Managed Care – PPO | Source: Ambulatory Visit | Attending: Radiation Oncology | Admitting: Radiation Oncology

## 2011-11-04 ENCOUNTER — Other Ambulatory Visit (HOSPITAL_BASED_OUTPATIENT_CLINIC_OR_DEPARTMENT_OTHER): Payer: BC Managed Care – PPO

## 2011-11-04 ENCOUNTER — Ambulatory Visit
Admission: RE | Admit: 2011-11-04 | Discharge: 2011-11-04 | Disposition: A | Discharge status: Home / Self Care | Payer: BC Managed Care – PPO | Source: Ambulatory Visit | Attending: Radiation Oncology | Admitting: Radiation Oncology

## 2011-11-04 DIAGNOSIS — C3490 Malignant neoplasm of unspecified part of unspecified bronchus or lung: Secondary | ICD-10-CM

## 2011-11-04 LAB — CBC WITH DIFFERENTIAL/PLATELET
BASO%: 0.5 % (ref 0.0–2.0)
EOS%: 0.3 % (ref 0.0–7.0)
LYMPH%: 14.8 % (ref 14.0–49.7)
MCH: 31.7 pg (ref 25.1–34.0)
MCHC: 34.4 g/dL (ref 31.5–36.0)
MCV: 92 fL (ref 79.5–101.0)
MONO%: 9.3 % (ref 0.0–14.0)
NEUT#: 3.8 10*3/uL (ref 1.5–6.5)
Platelets: 69 10*3/uL — ABNORMAL LOW (ref 145–400)
RBC: 3.32 10*6/uL — ABNORMAL LOW (ref 3.70–5.45)
RDW: 12.3 % (ref 11.2–14.5)

## 2011-11-04 LAB — COMPREHENSIVE METABOLIC PANEL
AST: 18 U/L (ref 0–37)
Albumin: 4 g/dL (ref 3.5–5.2)
Alkaline Phosphatase: 56 U/L (ref 39–117)
Calcium: 9.8 mg/dL (ref 8.4–10.5)
Chloride: 100 mEq/L (ref 96–112)
Glucose, Bld: 112 mg/dL — ABNORMAL HIGH (ref 70–99)
Potassium: 4.2 mEq/L (ref 3.5–5.3)
Sodium: 135 mEq/L (ref 135–145)
Total Protein: 6.9 g/dL (ref 6.0–8.3)

## 2011-11-05 ENCOUNTER — Ambulatory Visit
Admission: RE | Admit: 2011-11-05 | Discharge: 2011-11-05 | Disposition: A | Discharge status: Home / Self Care | Payer: BC Managed Care – PPO | Source: Ambulatory Visit | Attending: Radiation Oncology | Admitting: Radiation Oncology

## 2011-11-06 ENCOUNTER — Ambulatory Visit
Admission: RE | Admit: 2011-11-06 | Discharge: 2011-11-06 | Disposition: A | Discharge status: Home / Self Care | Payer: BC Managed Care – PPO | Source: Ambulatory Visit | Attending: Radiation Oncology | Admitting: Radiation Oncology

## 2011-11-07 ENCOUNTER — Ambulatory Visit
Admission: RE | Admit: 2011-11-07 | Discharge: 2011-11-07 | Disposition: A | Discharge status: Home / Self Care | Payer: BC Managed Care – PPO | Source: Ambulatory Visit | Attending: Radiation Oncology | Admitting: Radiation Oncology

## 2011-11-07 ENCOUNTER — Ambulatory Visit (HOSPITAL_COMMUNITY)
Admission: RE | Admit: 2011-11-07 | Discharge: 2011-11-07 | Disposition: A | Discharge status: Home / Self Care | Payer: BC Managed Care – PPO | Source: Ambulatory Visit | Attending: Physician Assistant | Admitting: Physician Assistant

## 2011-11-07 VITALS — BP 121/83 | HR 90 | Temp 97.6°F | Wt 136.4 lb

## 2011-11-07 DIAGNOSIS — C3410 Malignant neoplasm of upper lobe, unspecified bronchus or lung: Secondary | ICD-10-CM

## 2011-11-07 DIAGNOSIS — C3490 Malignant neoplasm of unspecified part of unspecified bronchus or lung: Secondary | ICD-10-CM | POA: Insufficient documentation

## 2011-11-07 DIAGNOSIS — J984 Other disorders of lung: Secondary | ICD-10-CM | POA: Insufficient documentation

## 2011-11-07 DIAGNOSIS — R222 Localized swelling, mass and lump, trunk: Secondary | ICD-10-CM | POA: Insufficient documentation

## 2011-11-07 MED ORDER — IOHEXOL 300 MG/ML  SOLN
100.0000 mL | Freq: Once | INTRAMUSCULAR | Status: AC | PRN
  Administered 2011-11-07: 80 mL via INTRAVENOUS

## 2011-11-07 NOTE — Progress Notes (Signed)
[facility] Radiation Oncology Weekly Treatment Note    Name: [name] [name] Date: 11/07/2011 MRN: [id number] DOB: [birth year]  Status: outpatient    Current dose: 48 Gy  Current fraction: 24  Planned dose: 66 gray  Planned fraction: 33   MEDICATIONS: Current Outpatient Prescriptions  Medication Sig Dispense Refill  . ALPRAZolam (XANAX) 0.5 MG tablet Take 0.5 tablets (0.25 mg total) by mouth 3 (three) times daily as needed. For anxiety.  90 tablet  0  . aspirin 81 MG tablet Take 81 mg by mouth daily.       Marland Kitchen atorvastatin (LIPITOR) 80 MG tablet TAKE 1 TABLET BY MOUTH EVERY DAY  30 tablet  0  . citalopram (CELEXA) 20 MG tablet Take 1 tablet (20 mg total) by mouth daily.  30 tablet  6  . lisinopril (PRINIVIL,ZESTRIL) 20 MG tablet Take 20 mg by mouth daily.      Marland Kitchen lisinopril (PRINIVIL,ZESTRIL) 20 MG tablet TAKE 1 TABLET BY MOUTH ONCE DAILY  90 tablet  3  . prochlorperazine (COMPAZINE) 10 MG tablet       . sucralfate (CARAFATE) 1 G tablet Take 1 tablet (1 g total) by mouth 4 (four) times daily. Dissolve tablet in 15cc water.  120 tablet  1  . ZETIA 10 MG tablet TAKE ONE TABLET BY MOUTH ONCE DAILY  90 tablet  0   No current facility-administered medications for this encounter.   Facility-Administered Medications Ordered in Other Encounters  Medication Dose Route Frequency Provider Last Rate Last Dose  . iohexol (OMNIPAQUE) 300 MG/ML solution 100 mL  100 mL Intravenous Once PRN Medication Radiologist, MD   80 mL at 11/07/11 1622     ALLERGIES: Codeine and Penicillins   LABORATORY DATA:  Lab Results  Component Value Date   WBC 5.0 11/04/2011   HGB 10.5* 11/04/2011   HCT 30.5* 11/04/2011   MCV 92.0 11/04/2011   PLT 69* 11/04/2011   Lab Results  Component Value Date   NA 135 11/04/2011   K 4.2 11/04/2011   CL 100 11/04/2011   CO2 27 11/04/2011   Lab Results  Component Value Date   ALT 14 11/04/2011   AST 18 11/04/2011   ALKPHOS 56 11/04/2011   BILITOT  0.1* 11/04/2011      NARRATIVE: [name] [name] was seen today for weekly treatment management. The chart was checked and CBCT images were reviewed. The patient is doing very well this week. Her energy level is good. The patient denies any worsening esophagitis.  PHYSICAL EXAMINATION: weight is 136 lb 6.4 oz (61.871 kg). Her temperature is 97.6 F (36.4 C). Her blood pressure is 121/83 and her pulse is 90. Her oxygen saturation is 100%.       ASSESSMENT: Patient tolerating treatments well.    PLAN: Continue treatment as planned.

## 2011-11-07 NOTE — Progress Notes (Signed)
Routine weekly under treat visit.Has completed 24 of 33 treatments to right lung. Has mild sore throat on swallowing.

## 2011-11-10 ENCOUNTER — Ambulatory Visit
Admission: RE | Admit: 2011-11-10 | Discharge: 2011-11-10 | Disposition: A | Discharge status: Home / Self Care | Payer: BC Managed Care – PPO | Source: Ambulatory Visit | Attending: Radiation Oncology | Admitting: Radiation Oncology

## 2011-11-11 ENCOUNTER — Other Ambulatory Visit (HOSPITAL_BASED_OUTPATIENT_CLINIC_OR_DEPARTMENT_OTHER): Payer: BC Managed Care – PPO | Admitting: Lab

## 2011-11-11 ENCOUNTER — Telehealth: Payer: Self-pay | Admitting: Internal Medicine

## 2011-11-11 ENCOUNTER — Ambulatory Visit (HOSPITAL_BASED_OUTPATIENT_CLINIC_OR_DEPARTMENT_OTHER): Payer: BC Managed Care – PPO

## 2011-11-11 ENCOUNTER — Ambulatory Visit
Admission: RE | Admit: 2011-11-11 | Discharge: 2011-11-11 | Disposition: A | Discharge status: Home / Self Care | Payer: BC Managed Care – PPO | Source: Ambulatory Visit | Attending: Radiation Oncology | Admitting: Radiation Oncology

## 2011-11-11 ENCOUNTER — Other Ambulatory Visit: Payer: Self-pay | Admitting: Internal Medicine

## 2011-11-11 VITALS — BP 131/74 | HR 87 | Temp 98.2°F

## 2011-11-11 DIAGNOSIS — C3490 Malignant neoplasm of unspecified part of unspecified bronchus or lung: Secondary | ICD-10-CM

## 2011-11-11 DIAGNOSIS — C3410 Malignant neoplasm of upper lobe, unspecified bronchus or lung: Secondary | ICD-10-CM

## 2011-11-11 DIAGNOSIS — Z5111 Encounter for antineoplastic chemotherapy: Secondary | ICD-10-CM

## 2011-11-11 LAB — COMPREHENSIVE METABOLIC PANEL WITH GFR
ALT: 11 U/L (ref 0–35)
AST: 18 U/L (ref 0–37)
Albumin: 4.1 g/dL (ref 3.5–5.2)
Alkaline Phosphatase: 50 U/L (ref 39–117)
BUN: 21 mg/dL (ref 6–23)
CO2: 25 meq/L (ref 19–32)
Calcium: 8.9 mg/dL (ref 8.4–10.5)
Chloride: 102 meq/L (ref 96–112)
Creatinine, Ser: 0.87 mg/dL (ref 0.50–1.10)
Glucose, Bld: 73 mg/dL (ref 70–99)
Potassium: 4.4 meq/L (ref 3.5–5.3)
Sodium: 136 meq/L (ref 135–145)
Total Bilirubin: 0.2 mg/dL — ABNORMAL LOW (ref 0.3–1.2)
Total Protein: 6.1 g/dL (ref 6.0–8.3)

## 2011-11-11 LAB — CBC WITH DIFFERENTIAL/PLATELET
BASO%: 0.3 % (ref 0.0–2.0)
Basophils Absolute: 0 10e3/uL (ref 0.0–0.1)
EOS%: 0.1 % (ref 0.0–7.0)
Eosinophils Absolute: 0 10e3/uL (ref 0.0–0.5)
HCT: 28.2 % — ABNORMAL LOW (ref 34.8–46.6)
HGB: 9.8 g/dL — ABNORMAL LOW (ref 11.6–15.9)
LYMPH%: 5.6 % — ABNORMAL LOW (ref 14.0–49.7)
MCH: 30.7 pg (ref 25.1–34.0)
MCHC: 34.8 g/dL (ref 31.5–36.0)
MCV: 88.4 fL (ref 79.5–101.0)
MONO#: 1 10e3/uL — ABNORMAL HIGH (ref 0.1–0.9)
MONO%: 14 % (ref 0.0–14.0)
NEUT#: 5.4 10e3/uL (ref 1.5–6.5)
NEUT%: 80 % — ABNORMAL HIGH (ref 38.4–76.8)
Platelets: 185 10e3/uL (ref 145–400)
RBC: 3.19 10e6/uL — ABNORMAL LOW (ref 3.70–5.45)
RDW: 13 % (ref 11.2–14.5)
WBC: 6.8 10e3/uL (ref 3.9–10.3)
lymph#: 0.4 10e3/uL — ABNORMAL LOW (ref 0.9–3.3)

## 2011-11-11 LAB — MAGNESIUM: Magnesium: 1.9 mg/dL (ref 1.5–2.5)

## 2011-11-11 MED ORDER — SODIUM CHLORIDE 0.9 % IV SOLN
120.0000 mg/m2 | Freq: Once | INTRAVENOUS | Status: AC
  Administered 2011-11-11: 200 mg via INTRAVENOUS
  Filled 2011-11-11: qty 10

## 2011-11-11 MED ORDER — SODIUM CHLORIDE 0.9 % IV SOLN
60.0000 mg/m2 | Freq: Once | INTRAVENOUS | Status: AC
  Administered 2011-11-11: 98 mg via INTRAVENOUS
  Filled 2011-11-11: qty 98

## 2011-11-11 MED ORDER — SODIUM CHLORIDE 0.9 % IV SOLN
150.0000 mg | Freq: Once | INTRAVENOUS | Status: AC
  Administered 2011-11-11: 150 mg via INTRAVENOUS
  Filled 2011-11-11: qty 5

## 2011-11-11 MED ORDER — PALONOSETRON HCL INJECTION 0.25 MG/5ML
0.2500 mg | Freq: Once | INTRAVENOUS | Status: AC
  Administered 2011-11-11: 0.25 mg via INTRAVENOUS

## 2011-11-11 MED ORDER — DEXAMETHASONE SODIUM PHOSPHATE 4 MG/ML IJ SOLN
12.0000 mg | Freq: Once | INTRAMUSCULAR | Status: AC
  Administered 2011-11-11: 12 mg via INTRAVENOUS

## 2011-11-11 MED ORDER — POTASSIUM CHLORIDE 2 MEQ/ML IV SOLN
Freq: Once | INTRAVENOUS | Status: AC
  Administered 2011-11-11: 09:00:00 via INTRAVENOUS
  Filled 2011-11-11: qty 10

## 2011-11-11 NOTE — Telephone Encounter (Signed)
Added f/u appt for 3/20 per desk nurse. Pt given new schedule. F/u not scheduled 2/28.

## 2011-11-12 ENCOUNTER — Ambulatory Visit: Payer: BC Managed Care – PPO

## 2011-11-12 ENCOUNTER — Ambulatory Visit (HOSPITAL_BASED_OUTPATIENT_CLINIC_OR_DEPARTMENT_OTHER): Payer: BC Managed Care – PPO | Admitting: Internal Medicine

## 2011-11-12 ENCOUNTER — Ambulatory Visit (HOSPITAL_BASED_OUTPATIENT_CLINIC_OR_DEPARTMENT_OTHER): Payer: BC Managed Care – PPO

## 2011-11-12 ENCOUNTER — Ambulatory Visit
Admission: RE | Admit: 2011-11-12 | Discharge: 2011-11-12 | Disposition: A | Discharge status: Home / Self Care | Payer: BC Managed Care – PPO | Source: Ambulatory Visit | Attending: Radiation Oncology | Admitting: Radiation Oncology

## 2011-11-12 VITALS — BP 131/82 | HR 82 | Temp 97.3°F | Ht 63.0 in | Wt 142.9 lb

## 2011-11-12 DIAGNOSIS — C3410 Malignant neoplasm of upper lobe, unspecified bronchus or lung: Secondary | ICD-10-CM

## 2011-11-12 DIAGNOSIS — C3490 Malignant neoplasm of unspecified part of unspecified bronchus or lung: Secondary | ICD-10-CM

## 2011-11-12 DIAGNOSIS — Z5111 Encounter for antineoplastic chemotherapy: Secondary | ICD-10-CM

## 2011-11-12 DIAGNOSIS — M810 Age-related osteoporosis without current pathological fracture: Secondary | ICD-10-CM

## 2011-11-12 MED ORDER — SODIUM CHLORIDE 0.9 % IV SOLN
Freq: Once | INTRAVENOUS | Status: AC
  Administered 2011-11-12: 15:00:00 via INTRAVENOUS

## 2011-11-12 MED ORDER — DEXAMETHASONE SODIUM PHOSPHATE 10 MG/ML IJ SOLN
10.0000 mg | Freq: Once | INTRAMUSCULAR | Status: AC
  Administered 2011-11-12: 10 mg via INTRAVENOUS

## 2011-11-12 MED ORDER — SODIUM CHLORIDE 0.9 % IV SOLN
120.0000 mg/m2 | Freq: Once | INTRAVENOUS | Status: AC
  Administered 2011-11-12: 200 mg via INTRAVENOUS
  Filled 2011-11-12: qty 10

## 2011-11-12 NOTE — Patient Instructions (Signed)
[facility] Discharge Instructions for Patients Receiving Chemotherapy  Today you received the following chemotherapy agents VP16.  To help prevent nausea and vomiting after your treatment, we encourage you to take your nausea medication as prescribed by your physician.  If you develop nausea and vomiting that is not controlled by your nausea medication, call the clinic. If it is after clinic hours your family physician or the after hours number for the clinic or go to the Emergency Department.   BELOW ARE SYMPTOMS THAT SHOULD BE REPORTED IMMEDIATELY:  *FEVER GREATER THAN 100.5 F  *CHILLS WITH OR WITHOUT FEVER  NAUSEA AND VOMITING THAT IS NOT CONTROLLED WITH YOUR NAUSEA MEDICATION  *UNUSUAL SHORTNESS OF BREATH  *UNUSUAL BRUISING OR BLEEDING  TENDERNESS IN MOUTH AND THROAT WITH OR WITHOUT PRESENCE OF ULCERS  *URINARY PROBLEMS  *BOWEL PROBLEMS  UNUSUAL RASH Items with * indicate a potential emergency and should be followed up as soon as possible.   Feel free to call the clinic you have any questions or concerns. The clinic phone number is [phone].   I have been informed and understand all the instructions given to me. I know to contact the clinic, my physician, or go to the Emergency Department if any problems should occur. I do not have any questions at this time, but understand that I may call the clinic during office hours   should I have any questions or need assistance in obtaining follow up care.    __________________________________________  _____________  __________ Signature of Patient or Authorized Representative            Date                   Time    __________________________________________ Nurse's Signature    

## 2011-11-12 NOTE — Progress Notes (Signed)
[facility] [phone]   [phone]  OFFICE PROGRESS NOTE  [name], MD, MD [phone] [facility] [address] [place] [address]  DIAGNOSIS: Limited stage small cell lung cancer   PRIOR THERAPY: None   CURRENT THERAPY: Systemic chemotherapy with cisplatin at 60 mg per meter square given on day 1 and etoposide at 120 mg per meter square given on days one 2 and 3 with Neulasta support given on day 4 status post 2 cycles. This with concurrent with radiotherapy under the care of Dr. Mitzi Hansen.  INTERVAL HISTORY: [name] [name] 61 y.o. female returns to the clinic today for followup visit accompanied by her sister. The patient tolerated the first 2 cycles of systemic chemotherapy with cisplatin and etoposide fairly well. She denied having any significant chest pain or shortness of breath, no cough or hemoptysis. She has no nausea or vomiting, no fever or chills and no significant weight loss. She is still on concurrent radiotherapy and she is tolerating it well too. The patient has repeat CT scan of the chest performed recently and she is here today for evaluation and discussion of her scan results.  MEDICAL HISTORY: Past Medical History  Diagnosis Date  . Hypertension   . High cholesterol   . Anxiety   . Osteoporosis   . Lung mass 09/19/11    small cell carcinoma  . Osteoporosis   . Lung cancer 09/19/11    SMALL CELL CARCINOMA    ALLERGIES:  is allergic to codeine and penicillins.  MEDICATIONS:  Current Outpatient Prescriptions  Medication Sig Dispense Refill  . ALPRAZolam (XANAX) 0.5 MG tablet Take 0.5 tablets (0.25 mg total) by mouth 3 (three) times daily as needed. For anxiety.  90 tablet  0  . aspirin 81 MG tablet Take 81 mg by mouth daily.       . citalopram (CELEXA) 20 MG tablet Take 1 tablet (20 mg total) by mouth daily.  30 tablet  6  . diphenhydrAMINE (BENADRYL) 25 MG tablet Take 25 mg by mouth every 6 (six) hours as  needed.      Marland Kitchen lisinopril (PRINIVIL,ZESTRIL) 20 MG tablet TAKE 1 TABLET BY MOUTH ONCE DAILY  90 tablet  3  . loratadine (CLARITIN) 10 MG tablet Take 10 mg by mouth daily. X 5 days after neulasta injection      . omeprazole (PRILOSEC) 20 MG capsule Take 20 mg by mouth daily.      . prochlorperazine (COMPAZINE) 10 MG tablet       . atorvastatin (LIPITOR) 80 MG tablet TAKE 1 TABLET BY MOUTH EVERY DAY  30 tablet  0  . sucralfate (CARAFATE) 1 G tablet Take 1 tablet (1 g total) by mouth 4 (four) times daily. Dissolve tablet in 15cc water.  120 tablet  1  . ZETIA 10 MG tablet TAKE ONE TABLET BY MOUTH ONCE DAILY  90 tablet  0   No current facility-administered medications for this visit.   Facility-Administered Medications Ordered in Other Visits  Medication Dose Route Frequency Provider Last Rate Last Dose  . 0.9 %  sodium chloride infusion   Intravenous Once Si Gaul, MD      . dexamethasone (DECADRON) injection 10 mg  10 mg Intravenous Once Si Gaul, MD   10 mg at 11/12/11 1446  . etoposide (VEPESID) 200 mg in sodium chloride 0.9 % 500 mL chemo infusion  120 mg/m2 (Treatment Plan Actual) Intravenous Once Si Gaul, MD  200 mg at 11/12/11 1457    SURGICAL HISTORY:  Past Surgical History  Procedure Date  . Tubal ligation   . Cholecystectomy 04/2011    biliary stent placement    REVIEW OF SYSTEMS:  A comprehensive review of systems was negative.   PHYSICAL EXAMINATION: General appearance: alert, cooperative and no distress Head: Normocephalic, without obvious abnormality, atraumatic Neck: no adenopathy Lymph nodes: Cervical, supraclavicular, and axillary nodes normal. Resp: clear to auscultation bilaterally Cardio: regular rate and rhythm, S1, S2 normal, no murmur, click, rub or gallop GI: soft, non-tender; bowel sounds normal; no masses,  no organomegaly Extremities: extremities normal, atraumatic, no cyanosis or edema Neurologic: Alert and oriented X 3, normal  strength and tone. Normal symmetric reflexes. Normal coordination and gait  ECOG PERFORMANCE STATUS: 0 - Asymptomatic  Blood pressure 131/82, pulse 82, temperature 97.3 F (36.3 C), temperature source Oral, height 5\' 3"  (1.6 m), weight 142 lb 14.4 oz (64.819 kg).  LABORATORY DATA: Lab Results  Component Value Date   WBC 6.8 11/11/2011   HGB 9.8* 11/11/2011   HCT 28.2* 11/11/2011   MCV 88.4 11/11/2011   PLT 185 11/11/2011      Chemistry      Component Value Date/Time   NA 136 11/11/2011 0808   K 4.4 11/11/2011 0808   CL 102 11/11/2011 0808   CO2 25 11/11/2011 0808   BUN 21 11/11/2011 0808   CREATININE 0.87 11/11/2011 0808      Component Value Date/Time   CALCIUM 8.9 11/11/2011 0808   ALKPHOS 50 11/11/2011 0808   AST 18 11/11/2011 0808   ALT 11 11/11/2011 0808   BILITOT 0.2* 11/11/2011 0808       RADIOGRAPHIC STUDIES: Ct Chest W Contrast  11/07/2011  *RADIOLOGY REPORT*  Clinical Data: Follow-up right lung carcinoma.  Ongoing chemotherapy and radiation therapy.  CT CHEST WITH CONTRAST  Technique:  Multidetector CT imaging of the chest was performed following the standard protocol during bolus administration of intravenous contrast.  Contrast: 80mL OMNIPAQUE IOHEXOL 300 MG/ML IJ SOLN  Comparison: 09/09/2011  Findings: Near complete resolution of the right suprahilar mass is seen since previous study, now measuring 1.1 x 1.5 cm compared to 2.9 x 3.9 cm on prior study.  A 7 mm precarinal mediastinal lymph node remains stable.  No new or enlarging lymphadenopathy seen within the thorax.  Right apical pulmonary scarring remains stable.  No evidence of acute infiltrate. A 3 mm nodule in the inferior aspect the lingula is unchanged. No evidence of pleural or pericardial effusion.  Both adrenal glands remain normal appearance.  No suspicious bone lesions are identified.  IMPRESSION:  1.  Near complete resolution of right suprahilar mass. 2.  Stable 7 mm precarinal lymph node. 3.  No new or progressive  disease within the thorax.  Original Report Authenticated By: Danae Orleans, M.D.    ASSESSMENT: This is a very pleasant 61 years old white female recently diagnosed with limited stage small cell lung cancer currently undergoing systemic chemotherapy with cisplatin and etoposide status post 2 cycle concurrent with radiotherapy. She is tolerating her treatment fairly well. The patient has significant improvement in her disease with near complete resolution of the right suprahilar mass.  PLAN: I discussed the scan results with the patient and her sister and showed them the images. I recommended for her to continue on the same treatment for now. She start of cycle #3 yesterday. She would come back for followup visit in 3 weeks with the start  of cycle #4. If she continues to have improvement in her disease after cycle #4 I may consider the patient for 1 or 2 more cycles of systemic chemotherapy before consideration of prophylactic cranial irradiation. The patient was advised to call me immediately if she has any concerning symptoms in the interval.  All questions were answered. The patient knows to call the clinic with any problems, questions or concerns. We can certainly see the patient much sooner if necessary.  I spent 20 minutes counseling the patient face to face. The total time spent in the appointment was 35 minutes.

## 2011-11-13 ENCOUNTER — Ambulatory Visit
Admission: RE | Admit: 2011-11-13 | Discharge: 2011-11-13 | Disposition: A | Discharge status: Home / Self Care | Payer: BC Managed Care – PPO | Source: Ambulatory Visit | Attending: Radiation Oncology | Admitting: Radiation Oncology

## 2011-11-13 ENCOUNTER — Other Ambulatory Visit: Payer: Self-pay | Admitting: Medical Oncology

## 2011-11-13 ENCOUNTER — Ambulatory Visit (HOSPITAL_BASED_OUTPATIENT_CLINIC_OR_DEPARTMENT_OTHER): Payer: BC Managed Care – PPO

## 2011-11-13 ENCOUNTER — Ambulatory Visit: Payer: BC Managed Care – PPO

## 2011-11-13 ENCOUNTER — Telehealth: Payer: Self-pay | Admitting: Internal Medicine

## 2011-11-13 VITALS — BP 125/80 | HR 76 | Temp 98.4°F

## 2011-11-13 DIAGNOSIS — C3410 Malignant neoplasm of upper lobe, unspecified bronchus or lung: Secondary | ICD-10-CM

## 2011-11-13 DIAGNOSIS — Z5111 Encounter for antineoplastic chemotherapy: Secondary | ICD-10-CM

## 2011-11-13 DIAGNOSIS — C3490 Malignant neoplasm of unspecified part of unspecified bronchus or lung: Secondary | ICD-10-CM

## 2011-11-13 MED ORDER — SODIUM CHLORIDE 0.9 % IV SOLN
Freq: Once | INTRAVENOUS | Status: AC
  Administered 2011-11-13: 13:00:00 via INTRAVENOUS

## 2011-11-13 MED ORDER — SODIUM CHLORIDE 0.9 % IV SOLN
120.0000 mg/m2 | Freq: Once | INTRAVENOUS | Status: AC
  Administered 2011-11-13: 200 mg via INTRAVENOUS
  Filled 2011-11-13: qty 10

## 2011-11-13 MED ORDER — DEXAMETHASONE SODIUM PHOSPHATE 10 MG/ML IJ SOLN
10.0000 mg | Freq: Once | INTRAMUSCULAR | Status: AC
  Administered 2011-11-13: 10 mg via INTRAVENOUS

## 2011-11-13 NOTE — Progress Notes (Signed)
Department of Radiation Oncology  Phone:  [phone] Fax:        [phone]  Weekly Treatment Note    Name: [name] [name] Date: 11/13/2011 MRN: [id number] DOB: 22-Apr-1951   Current dose: 56 gray  Current fraction: 28   MEDICATIONS: Current Outpatient Prescriptions  Medication Sig Dispense Refill  . ALPRAZolam (XANAX) 0.5 MG tablet Take 0.5 tablets (0.25 mg total) by mouth 3 (three) times daily as needed. For anxiety.  90 tablet  0  . aspirin 81 MG tablet Take 81 mg by mouth daily.       . citalopram (CELEXA) 20 MG tablet Take 1 tablet (20 mg total) by mouth daily.  30 tablet  6  . diphenhydrAMINE (BENADRYL) 25 MG tablet Take 25 mg by mouth every 6 (six) hours as needed.      Marland Kitchen lisinopril (PRINIVIL,ZESTRIL) 20 MG tablet TAKE 1 TABLET BY MOUTH ONCE DAILY  90 tablet  3  . loratadine (CLARITIN) 10 MG tablet Take 10 mg by mouth daily. X 5 days after neulasta injection      . omeprazole (PRILOSEC) 20 MG capsule Take 20 mg by mouth daily.      . prochlorperazine (COMPAZINE) 10 MG tablet       . atorvastatin (LIPITOR) 80 MG tablet TAKE 1 TABLET BY MOUTH EVERY DAY  30 tablet  0  . ZETIA 10 MG tablet TAKE ONE TABLET BY MOUTH ONCE DAILY  90 tablet  0   No current facility-administered medications for this encounter.   Facility-Administered Medications Ordered in Other Encounters  Medication Dose Route Frequency Provider Last Rate Last Dose  . 0.9 %  sodium chloride infusion   Intravenous Once Si Gaul, MD      . 0.9 %  sodium chloride infusion   Intravenous Once Si Gaul, MD      . dexamethasone (DECADRON) injection 10 mg  10 mg Intravenous Once Si Gaul, MD   10 mg at 11/13/11 1306  . etoposide (VEPESID) 200 mg in sodium chloride 0.9 % 500 mL chemo infusion  120 mg/m2 (Treatment Plan Actual) Intravenous Once Si Gaul, MD   200 mg at 11/12/11 1457  . etoposide (VEPESID) 200 mg in sodium chloride 0.9 % 500 mL chemo infusion  120 mg/m2 (Treatment  Plan Actual) Intravenous Once Si Gaul, MD   200 mg at 11/13/11 1326     ALLERGIES: Codeine and Penicillins   LABORATORY DATA:  Lab Results  Component Value Date   WBC 6.8 11/11/2011   HGB 9.8* 11/11/2011   HCT 28.2* 11/11/2011   MCV 88.4 11/11/2011   PLT 185 11/11/2011   Lab Results  Component Value Date   NA 136 11/11/2011   K 4.4 11/11/2011   CL 102 11/11/2011   CO2 25 11/11/2011   Lab Results  Component Value Date   ALT 11 11/11/2011   AST 18 11/11/2011   ALKPHOS 50 11/11/2011   BILITOT 0.2* 11/11/2011     NARRATIVE: [name] [name] was seen today for weekly treatment management. The chart was checked and the patient's films were reviewed. The patient is doing very well. She was having some pain in her back last week but this has resolved. She also states that her esophagitis has resolved also.  PHYSICAL EXAMINATION: vitals were not taken for this visit.       ASSESSMENT: The patient is doing satisfactorily with treatment.  PLAN: We will continue with the patient's radiation treatment as planned.

## 2011-11-13 NOTE — Telephone Encounter (Signed)
appts mad and printed for pt  aom

## 2011-11-13 NOTE — Progress Notes (Signed)
Chemotherapy today.  States that esophagitis has decreased markedly and is on Prilosec currently with relief.  States decreased discomfort from "raditation reaction".  Using Biafine and Benadryl.  Denies any pain.  28/ 33 fractions thus far.

## 2011-11-14 ENCOUNTER — Ambulatory Visit: Payer: BC Managed Care – PPO

## 2011-11-14 ENCOUNTER — Encounter: Payer: Self-pay | Admitting: *Deleted

## 2011-11-14 ENCOUNTER — Ambulatory Visit
Admission: RE | Admit: 2011-11-14 | Discharge: 2011-11-14 | Disposition: A | Discharge status: Home / Self Care | Payer: BC Managed Care – PPO | Source: Ambulatory Visit | Attending: Radiation Oncology | Admitting: Radiation Oncology

## 2011-11-14 ENCOUNTER — Ambulatory Visit (HOSPITAL_BASED_OUTPATIENT_CLINIC_OR_DEPARTMENT_OTHER): Payer: BC Managed Care – PPO

## 2011-11-14 DIAGNOSIS — C3410 Malignant neoplasm of upper lobe, unspecified bronchus or lung: Secondary | ICD-10-CM

## 2011-11-14 DIAGNOSIS — Z5189 Encounter for other specified aftercare: Secondary | ICD-10-CM

## 2011-11-14 DIAGNOSIS — C3490 Malignant neoplasm of unspecified part of unspecified bronchus or lung: Secondary | ICD-10-CM

## 2011-11-14 MED ORDER — PEGFILGRASTIM INJECTION 6 MG/0.6ML
6.0000 mg | Freq: Once | SUBCUTANEOUS | Status: AC
  Administered 2011-11-14: 6 mg via SUBCUTANEOUS

## 2011-11-14 MED ORDER — BIAFINE EX EMUL
CUTANEOUS | Status: DC | PRN
  Administered 2011-11-14: 14:00:00 via TOPICAL

## 2011-11-17 ENCOUNTER — Ambulatory Visit
Admission: RE | Admit: 2011-11-17 | Discharge: 2011-11-17 | Disposition: A | Discharge status: Home / Self Care | Payer: BC Managed Care – PPO | Source: Ambulatory Visit | Attending: Radiation Oncology | Admitting: Radiation Oncology

## 2011-11-17 DIAGNOSIS — C3410 Malignant neoplasm of upper lobe, unspecified bronchus or lung: Secondary | ICD-10-CM

## 2011-11-17 MED ORDER — HYDROCODONE-ACETAMINOPHEN 7.5-500 MG/15ML PO SOLN: ORAL | Status: DC

## 2011-11-17 NOTE — Progress Notes (Signed)
The patient is seen today for worsening esophagitis. She has tried Carafate but did not feel that this helped at all. She has also been on antireflux medication. We have discussed pain medication in the past but she has not taking anything right now. She does request liquid pain medicine.  She's had some nausea with codeine in the past. We discussed that many of the pain medicines are related and can cause nausea. She wants to try liquid Lortab and therefore I have given her a prescription for this. She is to let us know if she suffers nausea or other problems with this. Otherwise we'll continue her radiation treatment as planned thank you.

## 2011-11-17 NOTE — Progress Notes (Signed)
Here for assessment of increasing pain on swallowing. Has tried carafate in the past which did not help. Intolerant to codeine.

## 2011-11-18 ENCOUNTER — Other Ambulatory Visit (HOSPITAL_BASED_OUTPATIENT_CLINIC_OR_DEPARTMENT_OTHER): Payer: BC Managed Care – PPO | Admitting: Lab

## 2011-11-18 ENCOUNTER — Ambulatory Visit
Admission: RE | Admit: 2011-11-18 | Discharge: 2011-11-18 | Disposition: A | Discharge status: Home / Self Care | Payer: BC Managed Care – PPO | Source: Ambulatory Visit | Attending: Radiation Oncology | Admitting: Radiation Oncology

## 2011-11-18 DIAGNOSIS — C3490 Malignant neoplasm of unspecified part of unspecified bronchus or lung: Secondary | ICD-10-CM

## 2011-11-18 LAB — CBC WITH DIFFERENTIAL/PLATELET
BASO%: 0.4 % (ref 0.0–2.0)
Basophils Absolute: 0 10*3/uL (ref 0.0–0.1)
EOS%: 0.1 % (ref 0.0–7.0)
HCT: 28.6 % — ABNORMAL LOW (ref 34.8–46.6)
LYMPH%: 5.7 % — ABNORMAL LOW (ref 14.0–49.7)
MCH: 31.8 pg (ref 25.1–34.0)
MCHC: 34.3 g/dL (ref 31.5–36.0)
MCV: 93 fL (ref 79.5–101.0)
MONO%: 2.8 % (ref 0.0–14.0)
NEUT%: 91 % — ABNORMAL HIGH (ref 38.4–76.8)
Platelets: 91 10*3/uL — ABNORMAL LOW (ref 145–400)

## 2011-11-18 LAB — COMPREHENSIVE METABOLIC PANEL
ALT: 11 U/L (ref 0–35)
AST: 16 U/L (ref 0–37)
BUN: 24 mg/dL — ABNORMAL HIGH (ref 6–23)
Creatinine, Ser: 1.04 mg/dL (ref 0.50–1.10)
Total Bilirubin: 0.4 mg/dL (ref 0.3–1.2)

## 2011-11-19 ENCOUNTER — Ambulatory Visit
Admission: RE | Admit: 2011-11-19 | Discharge: 2011-11-19 | Disposition: A | Discharge status: Home / Self Care | Payer: BC Managed Care – PPO | Source: Ambulatory Visit | Attending: Radiation Oncology | Admitting: Radiation Oncology

## 2011-11-20 ENCOUNTER — Ambulatory Visit
Admission: RE | Admit: 2011-11-20 | Discharge: 2011-11-20 | Disposition: A | Discharge status: Home / Self Care | Payer: BC Managed Care – PPO | Source: Ambulatory Visit | Attending: Radiation Oncology | Admitting: Radiation Oncology

## 2011-11-20 VITALS — BP 96/65 | HR 128 | Temp 98.1°F | Resp 20 | Wt 133.7 lb

## 2011-11-20 DIAGNOSIS — C3410 Malignant neoplasm of upper lobe, unspecified bronchus or lung: Secondary | ICD-10-CM

## 2011-11-20 DIAGNOSIS — C3490 Malignant neoplasm of unspecified part of unspecified bronchus or lung: Secondary | ICD-10-CM

## 2011-11-20 MED ORDER — BIAFINE EX EMUL
CUTANEOUS | Status: DC | PRN
  Administered 2011-11-20: 1 via TOPICAL

## 2011-11-20 NOTE — Progress Notes (Signed)
C/o Odonyphagia to liquids and food and "sometimes it feels as if food gets stuck.  Admits to feeling lightheaded today. Hypotension noted. Started [employer] but admits to not taking regularly. Bright erythema with dry desquamation on posterior field and erythema with rash-like appearance in mid chest region.  C/o itching in posterior field.  Completes treatment today. Marland Kitchen

## 2011-11-20 NOTE — Progress Notes (Signed)
Department of Radiation Oncology  Phone:  [phone] Fax:        [phone]  Weekly Treatment Note    Name: [name] [name] Date: 11/20/2011 MRN: [id number] DOB: 03-31-1951   Current dose: 66 gray  Current fraction: 33   MEDICATIONS: Current Outpatient Prescriptions  Medication Sig Dispense Refill  . emollient (BIAFINE) cream Apply 1 Tube topically 2 (two) times daily.      Marland Kitchen ALPRAZolam (XANAX) 0.5 MG tablet Take 0.5 tablets (0.25 mg total) by mouth 3 (three) times daily as needed. For anxiety.  90 tablet  0  . aspirin 81 MG tablet Take 81 mg by mouth daily.       Marland Kitchen atorvastatin (LIPITOR) 80 MG tablet TAKE 1 TABLET BY MOUTH EVERY DAY  30 tablet  0  . citalopram (CELEXA) 20 MG tablet Take 1 tablet (20 mg total) by mouth daily.  30 tablet  6  . diphenhydrAMINE (BENADRYL) 25 MG tablet Take 25 mg by mouth every 6 (six) hours as needed.      Marland Kitchen HYDROcodone-acetaminophen (LORTAB) 7.5-500 MG/15ML solution 10 - 15 ml po Q 6 hours prn  480 mL  0  . lisinopril (PRINIVIL,ZESTRIL) 20 MG tablet TAKE 1 TABLET BY MOUTH ONCE DAILY  90 tablet  3  . loratadine (CLARITIN) 10 MG tablet Take 10 mg by mouth daily. X 5 days after neulasta injection      . omeprazole (PRILOSEC) 20 MG capsule Take 20 mg by mouth daily.      . prochlorperazine (COMPAZINE) 10 MG tablet       . ZETIA 10 MG tablet TAKE ONE TABLET BY MOUTH ONCE DAILY  90 tablet  0  . DISCONTD: sucralfate (CARAFATE) 1 G tablet Take 1 tablet (1 g total) by mouth 4 (four) times daily. Dissolve tablet in 15cc water.  120 tablet  1   Current Facility-Administered Medications  Medication Dose Route Frequency Provider Last Rate Last Dose  . topical emolient (BIAFINE) emulsion   Topical PRN Jonna Coup, MD   1 application at 11/20/11 1630   Facility-Administered Medications Ordered in Other Encounters  Medication Dose Route Frequency Provider Last Rate Last Dose  . topical emolient (BIAFINE) emulsion   Topical PRN Jonna Coup, MD         ALLERGIES: Codeine and Penicillins   LABORATORY DATA:  Lab Results  Component Value Date   WBC 8.9 11/18/2011   HGB 9.8* 11/18/2011   HCT 28.6* 11/18/2011   MCV 93.0 11/18/2011   PLT 91* 11/18/2011   Lab Results  Component Value Date   NA 136 11/18/2011   K 4.7 11/18/2011   CL 104 11/18/2011   CO2 22 11/18/2011   Lab Results  Component Value Date   ALT 11 11/18/2011   AST 16 11/18/2011   ALKPHOS 78 11/18/2011   BILITOT 0.4 11/18/2011     NARRATIVE: [name] was seen today for weekly treatment management. The chart was checked and the patient's films were reviewed. The patient finished her course of radiation today. She is having some odynophagia and her intake has decreased. Patient has not been taking in as much liquids and she has lost some weight over the last week. Orthostatics were also obtained  PHYSICAL EXAMINATION: weight is 133 lb 11.2 oz (60.646 kg). Her temperature is 98.1 F (36.7 C). Her blood pressure is 96/65 and her pulse is 128. Her respiration is 20.        ASSESSMENT: The  patient completed radiation treatment.  PLAN: Followup in one month. I did offer to have her vital signs recheck tomorrow prior to the weekend. She indicated that she would let us know she wanted to proceed with this tomorrow. I suggested that she hold off on taking her blood pressure medicine for the next 3 days. He also had a long discussion about her needing to increase her by mouth intake, especially with regards to fluids. The patient expressed an understanding of this and she felt that she was really going to be able to do this. Again she is to let us know tomorrow if this does not occur.

## 2011-11-25 ENCOUNTER — Telehealth: Payer: Self-pay | Admitting: *Deleted

## 2011-11-25 ENCOUNTER — Other Ambulatory Visit (HOSPITAL_BASED_OUTPATIENT_CLINIC_OR_DEPARTMENT_OTHER): Payer: BC Managed Care – PPO | Admitting: Lab

## 2011-11-25 DIAGNOSIS — C3490 Malignant neoplasm of unspecified part of unspecified bronchus or lung: Secondary | ICD-10-CM

## 2011-11-25 LAB — CBC WITH DIFFERENTIAL/PLATELET
BASO%: 0.4 % (ref 0.0–2.0)
EOS%: 0.7 % (ref 0.0–7.0)
LYMPH%: 9.4 % — ABNORMAL LOW (ref 14.0–49.7)
MCHC: 35 g/dL (ref 31.5–36.0)
MCV: 92.7 fL (ref 79.5–101.0)
MONO%: 10.7 % (ref 0.0–14.0)
Platelets: 34 10*3/uL — ABNORMAL LOW (ref 145–400)
RBC: 2.37 10*6/uL — ABNORMAL LOW (ref 3.70–5.45)
WBC: 4.4 10*3/uL (ref 3.9–10.3)
nRBC: 0 % (ref 0–0)

## 2011-11-25 LAB — COMPREHENSIVE METABOLIC PANEL
ALT: 8 U/L (ref 0–35)
AST: 13 U/L (ref 0–37)
Creatinine, Ser: 1.05 mg/dL (ref 0.50–1.10)
Total Bilirubin: 0.1 mg/dL — ABNORMAL LOW (ref 0.3–1.2)

## 2011-11-25 NOTE — Telephone Encounter (Signed)
Hbg 7.7, per Dr Donnald Garre pt needs 2 units this week.  Pt asymptomatic.  Will inform her on transfusion appt tomorrow when scheduled.  Pt verbalized understanding,  SLJ

## 2011-11-26 ENCOUNTER — Encounter (HOSPITAL_COMMUNITY)
Admission: RE | Admit: 2011-11-26 | Discharge: 2011-11-26 | Disposition: A | Discharge status: Home / Self Care | Payer: BC Managed Care – PPO | Source: Ambulatory Visit | Attending: Internal Medicine | Admitting: Internal Medicine

## 2011-11-26 ENCOUNTER — Other Ambulatory Visit: Payer: Self-pay | Admitting: *Deleted

## 2011-11-26 DIAGNOSIS — D649 Anemia, unspecified: Secondary | ICD-10-CM

## 2011-11-26 DIAGNOSIS — C3490 Malignant neoplasm of unspecified part of unspecified bronchus or lung: Secondary | ICD-10-CM | POA: Insufficient documentation

## 2011-11-27 ENCOUNTER — Other Ambulatory Visit: Payer: BC Managed Care – PPO

## 2011-11-28 ENCOUNTER — Ambulatory Visit (HOSPITAL_BASED_OUTPATIENT_CLINIC_OR_DEPARTMENT_OTHER): Payer: BC Managed Care – PPO

## 2011-11-28 VITALS — BP 100/66 | HR 84 | Temp 98.3°F | Resp 16

## 2011-11-28 DIAGNOSIS — D649 Anemia, unspecified: Secondary | ICD-10-CM

## 2011-11-28 LAB — PREPARE RBC (CROSSMATCH)

## 2011-11-28 MED ORDER — SODIUM CHLORIDE 0.9 % IV SOLN
250.0000 mL | Freq: Once | INTRAVENOUS | Status: AC
  Administered 2011-11-28: 250 mL via INTRAVENOUS

## 2011-11-28 MED ORDER — DIPHENHYDRAMINE HCL 50 MG/ML IJ SOLN
25.0000 mg | Freq: Once | INTRAMUSCULAR | Status: AC
  Administered 2011-11-28: 25 mg via INTRAVENOUS

## 2011-11-28 MED ORDER — ACETAMINOPHEN 325 MG PO TABS
650.0000 mg | ORAL_TABLET | Freq: Once | ORAL | Status: AC
  Administered 2011-11-28: 650 mg via ORAL

## 2011-11-29 LAB — TYPE AND SCREEN
ABO/RH(D): A POS
Unit division: 0

## 2011-12-02 ENCOUNTER — Ambulatory Visit (HOSPITAL_BASED_OUTPATIENT_CLINIC_OR_DEPARTMENT_OTHER): Payer: BC Managed Care – PPO | Admitting: Internal Medicine

## 2011-12-02 ENCOUNTER — Ambulatory Visit (HOSPITAL_BASED_OUTPATIENT_CLINIC_OR_DEPARTMENT_OTHER): Payer: BC Managed Care – PPO

## 2011-12-02 ENCOUNTER — Other Ambulatory Visit (HOSPITAL_BASED_OUTPATIENT_CLINIC_OR_DEPARTMENT_OTHER): Payer: BC Managed Care – PPO | Admitting: Lab

## 2011-12-02 DIAGNOSIS — C3490 Malignant neoplasm of unspecified part of unspecified bronchus or lung: Secondary | ICD-10-CM

## 2011-12-02 DIAGNOSIS — Z5111 Encounter for antineoplastic chemotherapy: Secondary | ICD-10-CM

## 2011-12-02 DIAGNOSIS — C3410 Malignant neoplasm of upper lobe, unspecified bronchus or lung: Secondary | ICD-10-CM

## 2011-12-02 LAB — COMPREHENSIVE METABOLIC PANEL
ALT: 8 U/L (ref 0–35)
AST: 15 U/L (ref 0–37)
Albumin: 3.8 g/dL (ref 3.5–5.2)
BUN: 13 mg/dL (ref 6–23)
Calcium: 8.4 mg/dL (ref 8.4–10.5)
Chloride: 107 mEq/L (ref 96–112)
Potassium: 4.6 mEq/L (ref 3.5–5.3)
Sodium: 138 mEq/L (ref 135–145)
Total Protein: 5.5 g/dL — ABNORMAL LOW (ref 6.0–8.3)

## 2011-12-02 LAB — CBC WITH DIFFERENTIAL/PLATELET
BASO%: 0.3 % (ref 0.0–2.0)
Basophils Absolute: 0 10*3/uL (ref 0.0–0.1)
EOS%: 0.9 % (ref 0.0–7.0)
HGB: 11 g/dL — ABNORMAL LOW (ref 11.6–15.9)
MCH: 31.7 pg (ref 25.1–34.0)
RBC: 3.47 10*6/uL — ABNORMAL LOW (ref 3.70–5.45)
RDW: 15.3 % — ABNORMAL HIGH (ref 11.2–14.5)
lymph#: 0.8 10*3/uL — ABNORMAL LOW (ref 0.9–3.3)

## 2011-12-02 MED ORDER — DEXAMETHASONE SODIUM PHOSPHATE 4 MG/ML IJ SOLN
12.0000 mg | Freq: Once | INTRAMUSCULAR | Status: AC
  Administered 2011-12-02: 12 mg via INTRAVENOUS

## 2011-12-02 MED ORDER — POTASSIUM CHLORIDE 2 MEQ/ML IV SOLN
Freq: Once | INTRAVENOUS | Status: AC
  Administered 2011-12-02: 11:00:00 via INTRAVENOUS
  Filled 2011-12-02: qty 10

## 2011-12-02 MED ORDER — SODIUM CHLORIDE 0.9 % IV SOLN
150.0000 mg | Freq: Once | INTRAVENOUS | Status: AC
  Administered 2011-12-02: 150 mg via INTRAVENOUS
  Filled 2011-12-02: qty 5

## 2011-12-02 MED ORDER — SODIUM CHLORIDE 0.9 % IV SOLN
120.0000 mg/m2 | Freq: Once | INTRAVENOUS | Status: AC
  Administered 2011-12-02: 200 mg via INTRAVENOUS
  Filled 2011-12-02: qty 10

## 2011-12-02 MED ORDER — SODIUM CHLORIDE 0.9 % IV SOLN
Freq: Once | INTRAVENOUS | Status: AC
  Administered 2011-12-02: 11:00:00 via INTRAVENOUS

## 2011-12-02 MED ORDER — PALONOSETRON HCL INJECTION 0.25 MG/5ML
0.2500 mg | Freq: Once | INTRAVENOUS | Status: AC
  Administered 2011-12-02: 0.25 mg via INTRAVENOUS

## 2011-12-02 MED ORDER — SODIUM CHLORIDE 0.9 % IV SOLN
60.0000 mg/m2 | Freq: Once | INTRAVENOUS | Status: AC
  Administered 2011-12-02: 98 mg via INTRAVENOUS
  Filled 2011-12-02: qty 98

## 2011-12-02 NOTE — Progress Notes (Signed)
[facility] [phone]   [phone]  OFFICE PROGRESS NOTE  [name], MD, MD [phone] [facility] [address] [place] [address]  DIAGNOSIS: Limited stage small cell lung cancer   PRIOR THERAPY: None   CURRENT THERAPY: Systemic chemotherapy with cisplatin at 60 mg per meter square given on day 1 and etoposide at 120 mg per meter square given on days one 2 and 3 with Neulasta support given on day 4 status post 3 cycles. This with concurrent with radiotherapy under the care of Dr. Mitzi Hansen.   INTERVAL HISTORY: [name] [name] 61 y.o. female returns to the clinic today for followup visit accompanied by her sister. The patient has no complaints today and feeling fine. She received 2 units of packed rbc's transfusion secondary to chemotherapy-induced anemia. She tolerated the last cycle of her chemotherapy fairly well was no significant nausea or vomiting, no fever or chills. She has no chest pain or shortness of breath, no cough or hemoptysis. She completed the course of concurrent radiotherapy under the care of Dr. Mitzi Hansen. He is swallowing is better. The patient is here today to start cycle #4 of her chemotherapy.  MEDICAL HISTORY: Past Medical History  Diagnosis Date  . Hypertension   . High cholesterol   . Anxiety   . Osteoporosis   . Lung mass 09/19/11    small cell carcinoma  . Osteoporosis   . Lung cancer 09/19/11    SMALL CELL CARCINOMA    ALLERGIES:  is allergic to codeine and penicillins.  MEDICATIONS:  Current Outpatient Prescriptions  Medication Sig Dispense Refill  . ALPRAZolam (XANAX) 0.5 MG tablet Take 0.5 tablets (0.25 mg total) by mouth 3 (three) times daily as needed. For anxiety.  90 tablet  0  . aspirin 81 MG tablet Take 81 mg by mouth daily.       . citalopram (CELEXA) 20 MG tablet Take 1 tablet (20 mg total) by mouth daily.  30 tablet  6  . emollient (BIAFINE) cream Apply 1 Tube topically 2 (two) times  daily.      Marland Kitchen lisinopril (PRINIVIL,ZESTRIL) 20 MG tablet TAKE 1 TABLET BY MOUTH ONCE DAILY  90 tablet  3  . loratadine (CLARITIN) 10 MG tablet Take 10 mg by mouth daily. X 5 days after neulasta injection      . omeprazole (PRILOSEC) 20 MG capsule Take 20 mg by mouth daily.      Marland Kitchen atorvastatin (LIPITOR) 80 MG tablet TAKE 1 TABLET BY MOUTH EVERY DAY  30 tablet  0  . diphenhydrAMINE (BENADRYL) 25 MG tablet Take 25 mg by mouth every 6 (six) hours as needed.      Marland Kitchen HYDROcodone-acetaminophen (LORTAB) 7.5-500 MG/15ML solution 10 - 15 ml po Q 6 hours prn  480 mL  0  . prochlorperazine (COMPAZINE) 10 MG tablet       . ZETIA 10 MG tablet TAKE ONE TABLET BY MOUTH ONCE DAILY  90 tablet  0  . DISCONTD: sucralfate (CARAFATE) 1 G tablet Take 1 tablet (1 g total) by mouth 4 (four) times daily. Dissolve tablet in 15cc water.  120 tablet  1   No current facility-administered medications for this visit.   Facility-Administered Medications Ordered in Other Visits  Medication Dose Route Frequency Provider Last Rate Last Dose  . topical emolient (BIAFINE) emulsion   Topical PRN Jonna Coup, MD        SURGICAL HISTORY:  Past Surgical  History  Procedure Date  . Tubal ligation   . Cholecystectomy 04/2011    biliary stent placement    REVIEW OF SYSTEMS:  A comprehensive review of systems was negative except for: Constitutional: positive for fatigue   PHYSICAL EXAMINATION: General appearance: alert, cooperative and no distress Head: Normocephalic, without obvious abnormality, atraumatic Neck: no adenopathy Lymph nodes: Cervical, supraclavicular, and axillary nodes normal. Resp: clear to auscultation bilaterally Cardio: regular rate and rhythm, S1, S2 normal, no murmur, click, rub or gallop GI: soft, non-tender; bowel sounds normal; no masses,  no organomegaly Extremities: extremities normal, atraumatic, no cyanosis or edema Neurologic: Alert and oriented X 3, normal strength and tone. Normal symmetric  reflexes. Normal coordination and gait  ECOG PERFORMANCE STATUS: 0 - Asymptomatic  There were no vitals taken for this visit.  LABORATORY DATA: Lab Results  Component Value Date   WBC 7.7 12/02/2011   HGB 11.0* 12/02/2011   HCT 31.6* 12/02/2011   MCV 91.1 12/02/2011   PLT 139* 12/02/2011      Chemistry      Component Value Date/Time   NA 135 11/25/2011 1514   K 3.9 11/25/2011 1514   CL 102 11/25/2011 1514   CO2 26 11/25/2011 1514   BUN 14 11/25/2011 1514   CREATININE 1.05 11/25/2011 1514      Component Value Date/Time   CALCIUM 9.4 11/25/2011 1514   ALKPHOS 69 11/25/2011 1514   AST 13 11/25/2011 1514   ALT 8 11/25/2011 1514   BILITOT 0.1* 11/25/2011 1514       RADIOGRAPHIC STUDIES: Ct Chest W Contrast  11/07/2011  *RADIOLOGY REPORT*  Clinical Data: Follow-up right lung carcinoma.  Ongoing chemotherapy and radiation therapy.  CT CHEST WITH CONTRAST  Technique:  Multidetector CT imaging of the chest was performed following the standard protocol during bolus administration of intravenous contrast.  Contrast: 80mL OMNIPAQUE IOHEXOL 300 MG/ML IJ SOLN  Comparison: 09/09/2011  Findings: Near complete resolution of the right suprahilar mass is seen since previous study, now measuring 1.1 x 1.5 cm compared to 2.9 x 3.9 cm on prior study.  A 7 mm precarinal mediastinal lymph node remains stable.  No new or enlarging lymphadenopathy seen within the thorax.  Right apical pulmonary scarring remains stable.  No evidence of acute infiltrate. A 3 mm nodule in the inferior aspect the lingula is unchanged. No evidence of pleural or pericardial effusion.  Both adrenal glands remain normal appearance.  No suspicious bone lesions are identified.  IMPRESSION:  1.  Near complete resolution of right suprahilar mass. 2.  Stable 7 mm precarinal lymph node. 3.  No new or progressive disease within the thorax.  Original Report Authenticated By: Danae Orleans, M.D.    ASSESSMENT: This is a very pleasant 61 years old white female with  limited stage small cell lung cancer currently on systemic chemotherapy with cisplatin and etoposide status post 3 cycles. This was concurrent with radiotherapy during the first 2 cycles. The patient is doing fine and tolerating her treatment fairly well.  PLAN: We will proceed with cycle #4 today as scheduled. The patient would come back for followup visit in 3 weeks with repeat CT scan of the chest for restaging of her disease. If there is any residual disease I may consider her for two more cycles of systemic chemotherapy. She was advised to call me immediately if she has any concerning symptoms in the interval.  All questions were answered. The patient knows to call the clinic with any  problems, questions or concerns. We can certainly see the patient much sooner if necessary.  I spent 20 minutes counseling the patient face to face. The total time spent in the appointment was 30 minutes.

## 2011-12-03 ENCOUNTER — Ambulatory Visit (HOSPITAL_BASED_OUTPATIENT_CLINIC_OR_DEPARTMENT_OTHER): Payer: BC Managed Care – PPO

## 2011-12-03 VITALS — BP 152/86 | HR 84 | Temp 98.8°F

## 2011-12-03 DIAGNOSIS — Z5111 Encounter for antineoplastic chemotherapy: Secondary | ICD-10-CM

## 2011-12-03 DIAGNOSIS — C3410 Malignant neoplasm of upper lobe, unspecified bronchus or lung: Secondary | ICD-10-CM

## 2011-12-03 DIAGNOSIS — C3490 Malignant neoplasm of unspecified part of unspecified bronchus or lung: Secondary | ICD-10-CM

## 2011-12-03 MED ORDER — DEXAMETHASONE SODIUM PHOSPHATE 10 MG/ML IJ SOLN
10.0000 mg | Freq: Once | INTRAMUSCULAR | Status: AC
  Administered 2011-12-03: 10 mg via INTRAVENOUS

## 2011-12-03 MED ORDER — SODIUM CHLORIDE 0.9 % IV SOLN
Freq: Once | INTRAVENOUS | Status: AC
  Administered 2011-12-03: 13:00:00 via INTRAVENOUS

## 2011-12-03 MED ORDER — SODIUM CHLORIDE 0.9 % IV SOLN
120.0000 mg/m2 | Freq: Once | INTRAVENOUS | Status: AC
  Administered 2011-12-03: 200 mg via INTRAVENOUS
  Filled 2011-12-03: qty 10

## 2011-12-03 NOTE — Patient Instructions (Signed)
[facility] Discharge Instructions for Patients Receiving Chemotherapy  Today you received the following chemotherapy agents Etoposide  To help prevent nausea and vomiting after your treatment, we encourage you to take your nausea medication as prescribed by MD   If you develop nausea and vomiting that is not controlled by your nausea medication, call the clinic. If it is after clinic hours your family physician or the after hours number for the clinic or go to the Emergency Department.   BELOW ARE SYMPTOMS THAT SHOULD BE REPORTED IMMEDIATELY:  *FEVER GREATER THAN 100.5 F  *CHILLS WITH OR WITHOUT FEVER  NAUSEA AND VOMITING THAT IS NOT CONTROLLED WITH YOUR NAUSEA MEDICATION  *UNUSUAL SHORTNESS OF BREATH  *UNUSUAL BRUISING OR BLEEDING  TENDERNESS IN MOUTH AND THROAT WITH OR WITHOUT PRESENCE OF ULCERS  *URINARY PROBLEMS  *BOWEL PROBLEMS  UNUSUAL RASH Items with * indicate a potential emergency and should be followed up as soon as possible.  One of the nurses will contact you 24 hours after your treatment. Please let the nurse know about any problems that you may have experienced. Feel free to call the clinic you have any questions or concerns. The clinic phone number is [phone].   I have been informed and understand all the instructions given to me. I know to contact the clinic, my physician, or go to the Emergency Department if any problems should occur. I do not have any questions at this time, but understand that I may call the clinic during office hours   should I have any questions or need assistance in obtaining follow up care.    __________________________________________  _____________  __________ Signature of Patient or Authorized Representative            Date                   Time    __________________________________________ Nurse's Signature

## 2011-12-04 ENCOUNTER — Ambulatory Visit (HOSPITAL_BASED_OUTPATIENT_CLINIC_OR_DEPARTMENT_OTHER): Payer: BC Managed Care – PPO

## 2011-12-04 DIAGNOSIS — Z5111 Encounter for antineoplastic chemotherapy: Secondary | ICD-10-CM

## 2011-12-04 DIAGNOSIS — C3490 Malignant neoplasm of unspecified part of unspecified bronchus or lung: Secondary | ICD-10-CM

## 2011-12-04 MED ORDER — SODIUM CHLORIDE 0.9 % IV SOLN
120.0000 mg/m2 | Freq: Once | INTRAVENOUS | Status: AC
  Administered 2011-12-04: 200 mg via INTRAVENOUS
  Filled 2011-12-04: qty 10

## 2011-12-04 MED ORDER — SODIUM CHLORIDE 0.9 % IV SOLN: Freq: Once | INTRAVENOUS | Status: DC

## 2011-12-04 MED ORDER — DEXAMETHASONE SODIUM PHOSPHATE 10 MG/ML IJ SOLN
10.0000 mg | Freq: Once | INTRAMUSCULAR | Status: AC
  Administered 2011-12-04: 10 mg via INTRAVENOUS

## 2011-12-05 ENCOUNTER — Ambulatory Visit (HOSPITAL_BASED_OUTPATIENT_CLINIC_OR_DEPARTMENT_OTHER): Payer: BC Managed Care – PPO

## 2011-12-05 ENCOUNTER — Telehealth: Payer: Self-pay | Admitting: Internal Medicine

## 2011-12-05 VITALS — BP 145/87 | HR 77 | Temp 97.4°F

## 2011-12-05 DIAGNOSIS — C3490 Malignant neoplasm of unspecified part of unspecified bronchus or lung: Secondary | ICD-10-CM

## 2011-12-05 DIAGNOSIS — Z5189 Encounter for other specified aftercare: Secondary | ICD-10-CM

## 2011-12-05 DIAGNOSIS — C3410 Malignant neoplasm of upper lobe, unspecified bronchus or lung: Secondary | ICD-10-CM

## 2011-12-05 MED ORDER — PEGFILGRASTIM INJECTION 6 MG/0.6ML
6.0000 mg | Freq: Once | SUBCUTANEOUS | Status: AC
  Administered 2011-12-05: 6 mg via SUBCUTANEOUS
  Filled 2011-12-05: qty 0.6

## 2011-12-05 NOTE — Telephone Encounter (Signed)
appts made and printed for pt aom °

## 2011-12-09 ENCOUNTER — Other Ambulatory Visit (HOSPITAL_BASED_OUTPATIENT_CLINIC_OR_DEPARTMENT_OTHER): Payer: BC Managed Care – PPO | Admitting: Lab

## 2011-12-09 DIAGNOSIS — C3490 Malignant neoplasm of unspecified part of unspecified bronchus or lung: Secondary | ICD-10-CM

## 2011-12-09 LAB — CBC WITH DIFFERENTIAL/PLATELET
BASO%: 0.2 % (ref 0.0–2.0)
EOS%: 0.1 % (ref 0.0–7.0)
MCH: 32.3 pg (ref 25.1–34.0)
MCHC: 33.6 g/dL (ref 31.5–36.0)
MCV: 96.2 fL (ref 79.5–101.0)
MONO%: 4.4 % (ref 0.0–14.0)
RBC: 3.08 10*6/uL — ABNORMAL LOW (ref 3.70–5.45)
RDW: 15.5 % — ABNORMAL HIGH (ref 11.2–14.5)
lymph#: 0.5 10*3/uL — ABNORMAL LOW (ref 0.9–3.3)

## 2011-12-09 LAB — COMPREHENSIVE METABOLIC PANEL
AST: 14 U/L (ref 0–37)
Albumin: 3.9 g/dL (ref 3.5–5.2)
Alkaline Phosphatase: 89 U/L (ref 39–117)
BUN: 26 mg/dL — ABNORMAL HIGH (ref 6–23)
Potassium: 4.4 mEq/L (ref 3.5–5.3)
Sodium: 134 mEq/L — ABNORMAL LOW (ref 135–145)
Total Bilirubin: 0.4 mg/dL (ref 0.3–1.2)
Total Protein: 6.4 g/dL (ref 6.0–8.3)

## 2011-12-16 ENCOUNTER — Encounter: Payer: Self-pay | Admitting: Radiation Oncology

## 2011-12-16 ENCOUNTER — Other Ambulatory Visit (HOSPITAL_BASED_OUTPATIENT_CLINIC_OR_DEPARTMENT_OTHER): Payer: BC Managed Care – PPO | Admitting: Lab

## 2011-12-16 DIAGNOSIS — C3490 Malignant neoplasm of unspecified part of unspecified bronchus or lung: Secondary | ICD-10-CM

## 2011-12-16 DIAGNOSIS — C3410 Malignant neoplasm of upper lobe, unspecified bronchus or lung: Secondary | ICD-10-CM

## 2011-12-16 DIAGNOSIS — D649 Anemia, unspecified: Secondary | ICD-10-CM

## 2011-12-16 LAB — CBC WITH DIFFERENTIAL/PLATELET
BASO%: 0.4 % (ref 0.0–2.0)
Basophils Absolute: 0 10*3/uL (ref 0.0–0.1)
EOS%: 0.3 % (ref 0.0–7.0)
HGB: 8.5 g/dL — ABNORMAL LOW (ref 11.6–15.9)
MCH: 32.8 pg (ref 25.1–34.0)
MCHC: 34.3 g/dL (ref 31.5–36.0)
MCV: 95.7 fL (ref 79.5–101.0)
MONO%: 14.7 % — ABNORMAL HIGH (ref 0.0–14.0)
RBC: 2.6 10*6/uL — ABNORMAL LOW (ref 3.70–5.45)
RDW: 15.5 % — ABNORMAL HIGH (ref 11.2–14.5)
lymph#: 0.7 10*3/uL — ABNORMAL LOW (ref 0.9–3.3)

## 2011-12-16 LAB — COMPREHENSIVE METABOLIC PANEL
AST: 13 U/L (ref 0–37)
Albumin: 3.6 g/dL (ref 3.5–5.2)
Alkaline Phosphatase: 76 U/L (ref 39–117)
BUN: 15 mg/dL (ref 6–23)
Calcium: 8.8 mg/dL (ref 8.4–10.5)
Glucose, Bld: 99 mg/dL (ref 70–99)
Sodium: 140 mEq/L (ref 135–145)
Total Bilirubin: 0.1 mg/dL — ABNORMAL LOW (ref 0.3–1.2)
Total Protein: 6.2 g/dL (ref 6.0–8.3)

## 2011-12-19 ENCOUNTER — Encounter: Payer: Self-pay | Admitting: Radiation Oncology

## 2011-12-19 ENCOUNTER — Ambulatory Visit
Admission: RE | Admit: 2011-12-19 | Discharge: 2011-12-19 | Disposition: A | Discharge status: Home / Self Care | Payer: BC Managed Care – PPO | Source: Ambulatory Visit | Attending: Radiation Oncology | Admitting: Radiation Oncology

## 2011-12-19 VITALS — BP 154/84 | HR 81 | Temp 98.4°F | Resp 20 | Wt 137.1 lb

## 2011-12-19 DIAGNOSIS — C3410 Malignant neoplasm of upper lobe, unspecified bronchus or lung: Secondary | ICD-10-CM

## 2011-12-19 NOTE — Progress Notes (Signed)
Pt denies pain, sob, cough, fatigue, lack of appetite. States "food tastes weird, tongue feels thick but may be due to my Celexa".

## 2011-12-19 NOTE — Progress Notes (Signed)
Radiation Oncology         [phone] ________________________________  Name: SHIANN KAM MRN: [id number]  Date: 12/19/2011  DOB: 04-26-1951  Follow-Up Visit Note  CC: [name], MD, MD  Ines Bloomer, MD Dr. Arbutus Ped  Diagnosis:   Limited stage small cell lung cancer  Interval Since Last Radiation:  approximately one month   Narrative:  The patient returns today for routine follow-up.  The patient is doing well. She is very pleased that her esophagitis has resolved. She has continued to take Prilosec we discussed that she could probably stop this at this point. She denies any worsening shortness of breath and overall feels good in this regard. She denies any chest pain or other distant pain. She has completed chemotherapy as of now with Dr. Arbutus Ped and she is scheduled for a CT scan next week with subsequent followup with him.                              ALLERGIES:  is allergic to codeine and penicillins.  Meds: Current Outpatient Prescriptions  Medication Sig Dispense Refill  . ALPRAZolam (XANAX) 0.5 MG tablet Take 0.5 tablets (0.25 mg total) by mouth 3 (three) times daily as needed. For anxiety.  90 tablet  0  . aspirin 81 MG tablet Take 81 mg by mouth daily.       Marland Kitchen atorvastatin (LIPITOR) 80 MG tablet TAKE 1 TABLET BY MOUTH EVERY DAY  30 tablet  0  . citalopram (CELEXA) 20 MG tablet Take 1 tablet (20 mg total) by mouth daily.  30 tablet  6  . diphenhydrAMINE (BENADRYL) 25 MG tablet Take 25 mg by mouth every 6 (six) hours as needed.      Marland Kitchen lisinopril (PRINIVIL,ZESTRIL) 20 MG tablet TAKE 1 TABLET BY MOUTH ONCE DAILY  90 tablet  3  . loratadine (CLARITIN) 10 MG tablet Take 10 mg by mouth daily. X 5 days after neulasta injection      . omeprazole (PRILOSEC) 20 MG capsule Take 20 mg by mouth daily.      . prochlorperazine (COMPAZINE) 10 MG tablet       . ZETIA 10 MG tablet TAKE ONE TABLET BY MOUTH ONCE DAILY  90 tablet  0  . DISCONTD: sucralfate (CARAFATE) 1 G tablet  Take 1 tablet (1 g total) by mouth 4 (four) times daily. Dissolve tablet in 15cc water.  120 tablet  1   No current facility-administered medications for this encounter.   Facility-Administered Medications Ordered in Other Encounters  Medication Dose Route Frequency Provider Last Rate Last Dose  . topical emolient (BIAFINE) emulsion   Topical PRN Jonna Coup, MD        Physical Findings: The patient is in no acute distress. Patient is alert and oriented.  weight is 137 lb 1.6 oz (62.188 kg). Her oral temperature is 98.4 F (36.9 C). Her blood pressure is 154/84 and her pulse is 81. Her respiration is 20 and oxygen saturation is 100%. .   General: Well-developed, in no acute distress Cardiovascular: Regular rate and rhythm Respiratory: Clear to auscultation bilaterally GI: Soft, nontender, normal bowel sounds Extremities: No edema present Neuro: No focal deficits    Lab Findings: Lab Results  Component Value Date   WBC 6.1 12/16/2011   HGB 8.5* 12/16/2011   HCT 24.9* 12/16/2011   MCV 95.7 12/16/2011   PLT 36* 12/16/2011     Radiographic Findings:  No results found.  Impression:    Pleasant 61 year old female with limited stage small cell lung cancer. She has completed a course of chemoradiotherapy.  Plan:  She is due for scans next week and she will discuss with Dr. Arbutus Ped whether he will recommend any further chemotherapy. We did discuss the issue once again of prophylactic cranial irradiation. I will see how her conversation goes with him and then we will either bring her back in the near future to coordinate a course of prophylactic cranial irradiation, or otherwise I will see the patient in approximately 3 months for ongoing followup.   Radene Gunning, M.D., Ph.D.

## 2011-12-23 ENCOUNTER — Ambulatory Visit (HOSPITAL_COMMUNITY)
Admission: RE | Admit: 2011-12-23 | Discharge: 2011-12-23 | Disposition: A | Discharge status: Home / Self Care | Payer: BC Managed Care – PPO | Source: Ambulatory Visit | Attending: Internal Medicine | Admitting: Internal Medicine

## 2011-12-23 ENCOUNTER — Other Ambulatory Visit (HOSPITAL_BASED_OUTPATIENT_CLINIC_OR_DEPARTMENT_OTHER): Payer: BC Managed Care – PPO | Admitting: Lab

## 2011-12-23 ENCOUNTER — Encounter (HOSPITAL_COMMUNITY): Payer: Self-pay

## 2011-12-23 DIAGNOSIS — C3490 Malignant neoplasm of unspecified part of unspecified bronchus or lung: Secondary | ICD-10-CM | POA: Insufficient documentation

## 2011-12-23 DIAGNOSIS — D649 Anemia, unspecified: Secondary | ICD-10-CM

## 2011-12-23 LAB — CBC WITH DIFFERENTIAL/PLATELET
BASO%: 0.3 % (ref 0.0–2.0)
EOS%: 0.8 % (ref 0.0–7.0)
HCT: 26.5 % — ABNORMAL LOW (ref 34.8–46.6)
MCHC: 33.7 g/dL (ref 31.5–36.0)
MONO#: 0.8 10*3/uL (ref 0.1–0.9)
NEUT%: 81.2 % — ABNORMAL HIGH (ref 38.4–76.8)
RBC: 2.74 10*6/uL — ABNORMAL LOW (ref 3.70–5.45)
RDW: 17 % — ABNORMAL HIGH (ref 11.2–14.5)
WBC: 6.7 10*3/uL (ref 3.9–10.3)
lymph#: 0.4 10*3/uL — ABNORMAL LOW (ref 0.9–3.3)
nRBC: 0 % (ref 0–0)

## 2011-12-23 LAB — CMP (CANCER CENTER ONLY)
ALT(SGPT): 16 U/L (ref 10–47)
AST: 15 U/L (ref 11–38)
Albumin: 3.4 g/dL (ref 3.3–5.5)
Alkaline Phosphatase: 71 U/L (ref 26–84)
Calcium: 8.8 mg/dL (ref 8.0–10.3)
Chloride: 99 mEq/L (ref 98–108)
Potassium: 5.5 mEq/L — ABNORMAL HIGH (ref 3.3–4.7)
Sodium: 138 mEq/L (ref 128–145)
Total Protein: 6.8 g/dL (ref 6.4–8.1)

## 2011-12-23 MED ORDER — IOHEXOL 300 MG/ML  SOLN
80.0000 mL | Freq: Once | INTRAMUSCULAR | Status: AC | PRN
  Administered 2011-12-23: 80 mL via INTRAVENOUS

## 2011-12-23 NOTE — Progress Notes (Signed)
Quick Note:  Call patient with the result and repeat B-met in 1-2 days ______ 

## 2011-12-24 ENCOUNTER — Telehealth: Payer: Self-pay | Admitting: *Deleted

## 2011-12-24 ENCOUNTER — Other Ambulatory Visit: Payer: Self-pay | Admitting: *Deleted

## 2011-12-24 ENCOUNTER — Ambulatory Visit (HOSPITAL_BASED_OUTPATIENT_CLINIC_OR_DEPARTMENT_OTHER): Payer: BC Managed Care – PPO | Admitting: Internal Medicine

## 2011-12-24 ENCOUNTER — Ambulatory Visit (HOSPITAL_BASED_OUTPATIENT_CLINIC_OR_DEPARTMENT_OTHER): Payer: BC Managed Care – PPO | Admitting: Lab

## 2011-12-24 ENCOUNTER — Telehealth: Payer: Self-pay | Admitting: Internal Medicine

## 2011-12-24 VITALS — BP 129/77 | HR 91 | Temp 98.0°F | Ht 63.0 in | Wt 135.7 lb

## 2011-12-24 DIAGNOSIS — C3490 Malignant neoplasm of unspecified part of unspecified bronchus or lung: Secondary | ICD-10-CM

## 2011-12-24 DIAGNOSIS — C3410 Malignant neoplasm of upper lobe, unspecified bronchus or lung: Secondary | ICD-10-CM

## 2011-12-24 DIAGNOSIS — E875 Hyperkalemia: Secondary | ICD-10-CM

## 2011-12-24 LAB — COMPREHENSIVE METABOLIC PANEL
ALT: 9 U/L (ref 0–35)
CO2: 25 mEq/L (ref 19–32)
Calcium: 9.8 mg/dL (ref 8.4–10.5)
Chloride: 98 mEq/L (ref 96–112)
Glucose, Bld: 72 mg/dL (ref 70–99)
Sodium: 135 mEq/L (ref 135–145)
Total Protein: 6.5 g/dL (ref 6.0–8.3)

## 2011-12-24 LAB — CBC WITH DIFFERENTIAL/PLATELET
Basophils Absolute: 0 10*3/uL (ref 0.0–0.1)
Eosinophils Absolute: 0 10*3/uL (ref 0.0–0.5)
HGB: 8.9 g/dL — ABNORMAL LOW (ref 11.6–15.9)
LYMPH%: 5.9 % — ABNORMAL LOW (ref 14.0–49.7)
MCV: 95.9 fL (ref 79.5–101.0)
MONO#: 0.8 10*3/uL (ref 0.1–0.9)
MONO%: 9.2 % (ref 0.0–14.0)
NEUT#: 7.4 10*3/uL — ABNORMAL HIGH (ref 1.5–6.5)
Platelets: 201 10*3/uL (ref 145–400)
RBC: 2.74 10*6/uL — ABNORMAL LOW (ref 3.70–5.45)
RDW: 16.7 % — ABNORMAL HIGH (ref 11.2–14.5)
WBC: 8.8 10*3/uL (ref 3.9–10.3)
nRBC: 0 % (ref 0–0)

## 2011-12-24 NOTE — Telephone Encounter (Signed)
K 5.5, pt to get STAT BMET when she comes to clinic today, pt aware.  SLJ

## 2011-12-24 NOTE — Telephone Encounter (Signed)
Message copied by Caren Griffins on Wed Dec 24, 2011  9:42 AM ------      Message from: Si Gaul      Created: Tue Dec 23, 2011  6:29 PM       Call patient with the result and repeat Bmet in 1-2 days.

## 2011-12-24 NOTE — Progress Notes (Signed)
[facility] [phone]   Fax:([phone]  OFFICE PROGRESS NOTE  [name], MD, MD [phone] [facility] [address] [place] [address]  DIAGNOSIS: Limited stage small cell lung cancer   PRIOR THERAPY: Systemic chemotherapy with cisplatin at 60 mg per meter square given on day 1 and etoposide at 120 mg per meter square given on days one 2 and 3 with Neulasta support given on day 4 status post 4 cycles, last cycle was given on 12/02/2011 . This with concurrent with radiotherapy under the care of Dr. Mitzi Hansen.   CURRENT THERAPY: None.  INTERVAL HISTORY: [name] [name] 61 y.o. female returns to the clinic today for followup visit accompanied by her sister. The patient is feeling very well today with no specific complaints. She tolerated the last cycle of her chemotherapy well. She denied having any significant nausea or vomiting, no fever or chills. She has no chest pain or shortness of breath. She has repeat CT scan of the chest performed recently and she is here today for evaluation and discussion of her scan results.  MEDICAL HISTORY: Past Medical History  Diagnosis Date  . Hypertension   . High cholesterol   . Anxiety   . Osteoporosis   . Lung mass 09/19/11    small cell carcinoma  . Osteoporosis   . Hx of radiation therapy 10/07/11 to 11/20/11    right lung  . Lung cancer 09/19/11    SMALL CELL CARCINOMA    ALLERGIES:  is allergic to codeine and penicillins.  MEDICATIONS:  Current Outpatient Prescriptions  Medication Sig Dispense Refill  . ALPRAZolam (XANAX) 0.5 MG tablet Take 0.5 tablets (0.25 mg total) by mouth 3 (three) times daily as needed. For anxiety.  90 tablet  0  . aspirin 81 MG tablet Take 81 mg by mouth daily.       Marland Kitchen atorvastatin (LIPITOR) 80 MG tablet TAKE 1 TABLET BY MOUTH EVERY DAY  30 tablet  0  . citalopram (CELEXA) 20 MG tablet Take 1 tablet (20 mg total) by mouth daily.  30 tablet  6  . diphenhydrAMINE  (BENADRYL) 25 MG tablet Take 25 mg by mouth every 6 (six) hours as needed.      Marland Kitchen lisinopril (PRINIVIL,ZESTRIL) 20 MG tablet TAKE 1 TABLET BY MOUTH ONCE DAILY  90 tablet  3  . omeprazole (PRILOSEC) 20 MG capsule Take 20 mg by mouth daily.      Marland Kitchen ZETIA 10 MG tablet TAKE ONE TABLET BY MOUTH ONCE DAILY  90 tablet  0  . loratadine (CLARITIN) 10 MG tablet Take 10 mg by mouth daily. X 5 days after neulasta injection      . prochlorperazine (COMPAZINE) 10 MG tablet       . DISCONTD: sucralfate (CARAFATE) 1 G tablet Take 1 tablet (1 g total) by mouth 4 (four) times daily. Dissolve tablet in 15cc water.  120 tablet  1   No current facility-administered medications for this visit.   Facility-Administered Medications Ordered in Other Visits  Medication Dose Route Frequency Provider Last Rate Last Dose  . topical emolient (BIAFINE) emulsion   Topical PRN Jonna Coup, MD        SURGICAL HISTORY:  Past Surgical History  Procedure Date  . Tubal ligation   . Cholecystectomy 04/2011    biliary stent placement    REVIEW OF SYSTEMS:  A comprehensive review of systems was negative.   PHYSICAL EXAMINATION: General  appearance: alert, cooperative and no distress Head: Normocephalic, without obvious abnormality, atraumatic Neck: no adenopathy Lymph nodes: Cervical, supraclavicular, and axillary nodes normal. Resp: clear to auscultation bilaterally Cardio: regular rate and rhythm, S1, S2 normal, no murmur, click, rub or gallop GI: soft, non-tender; bowel sounds normal; no masses,  no organomegaly Extremities: extremities normal, atraumatic, no cyanosis or edema Neurologic: Alert and oriented X 3, normal strength and tone. Normal symmetric reflexes. Normal coordination and gait  ECOG PERFORMANCE STATUS: 0 - Asymptomatic  Blood pressure 129/77, pulse 91, temperature 98 F (36.7 C), temperature source Oral, height 5\' 3"  (1.6 m), weight 135 lb 11.2 oz (61.553 kg).  LABORATORY DATA: Lab Results    Component Value Date   WBC 8.8 12/24/2011   HGB 8.9* 12/24/2011   HCT 26.3* 12/24/2011   MCV 95.9 12/24/2011   PLT 201 12/24/2011      Chemistry      Component Value Date/Time   NA 138 12/23/2011 0822   NA 140 12/16/2011 1509   K 5.5* 12/23/2011 0822   K 3.9 12/16/2011 1509   CL 99 12/23/2011 0822   CL 104 12/16/2011 1509   CO2 26 12/23/2011 0822   CO2 27 12/16/2011 1509   BUN 15 12/23/2011 0822   BUN 15 12/16/2011 1509   CREATININE 1.2 12/23/2011 0822   CREATININE 1.10 12/16/2011 1509      Component Value Date/Time   CALCIUM 8.8 12/23/2011 0822   CALCIUM 8.8 12/16/2011 1509   ALKPHOS 71 12/23/2011 0822   ALKPHOS 76 12/16/2011 1509   AST 15 12/23/2011 0822   AST 13 12/16/2011 1509   ALT 9 12/16/2011 1509   BILITOT 0.60 12/23/2011 0822   BILITOT 0.1* 12/16/2011 1509       RADIOGRAPHIC STUDIES: Ct Chest W Contrast  12/23/2011  *RADIOLOGY REPORT*  Clinical Data: Follow-up lung cancer.  CT CHEST WITH CONTRAST  Technique:  Multidetector CT imaging of the chest was performed following the standard protocol during bolus administration of intravenous contrast.  Contrast: 80mL OMNIPAQUE IOHEXOL 300 MG/ML  SOLN  Comparison: CT scan 09/09/2011 and 11/07/2011.  Findings: The chest wall is unremarkable and stable.  No breast masses, supraclavicular or axillary lymphadenopathy.  Thyroid gland appears normal.  The bony thorax is intact.  No destructive bone lesions or spinal canal compromise.  The heart is normal in size.  No pericardial effusion.  No mediastinal or hilar lymphadenopathy.  Small stable scattered mediastinal lymph nodes.  No CT findings for recurrent right suprahilar mass.  The aorta is normal in caliber.  No dissection. The esophagus is grossly normal.  Examination of the lung parenchyma demonstrates patchy areas of subpleural subsegmental right upper lobe atelectasis.  Stable apical scarring changes.  Minimal stable lingular scarring changes and slight nodularity but no discrete pulmonary nodule or  mass.  No pleural effusion or pulmonary edema.  The tracheobronchial tree is unremarkable.  The upper abdomen is unremarkable.  No findings for upper abdominal metastatic disease.  IMPRESSION:  1.  No CT findings for recurrent tumor or adenopathy. 2.  No findings for metastatic pulmonary disease. 3.  New patchy areas of subsegmental subpleural right upper lobe atelectasis.  Observation is recommended.  Original Report Authenticated By: P. Loralie Champagne, M.D.    ASSESSMENT: This is a very pleasant 61 years old white female with limited stage small cell lung cancer status post 4 cycles of systemic chemotherapy with cisplatin and etoposide concurrent with radiation. The patient has almost complete response of her disease  with her treatment.   PLAN: I discussed the scan results and showed the images to the patient and her sister. I recommended for her continuous observation for now with repeat CT scan of the chest with contrast in 3 months. The patient would see Dr. Mitzi Hansen and the next few weeks for evaluation and discussion of prophylactic cranial irradiation. She was advised to call me immediately if she has any concerning symptoms in the interval.  All questions were answered. The patient knows to call the clinic with any problems, questions or concerns. We can certainly see the patient much sooner if necessary.  I spent 20 minutes counseling the patient face to face. The total time spent in the appointment was 30 minutes.

## 2011-12-24 NOTE — Telephone Encounter (Signed)
Gv pt appt for july-aug2013  scheduled ct scan on 07/30 @ [facility]

## 2011-12-26 NOTE — Progress Notes (Signed)
  Radiation Oncology         [phone] ________________________________  Name: [name] [name] MRN: [id number]  Date: 11/14/2011  DOB: 1950-10-06  COMPLEX SIMULATION  NOTE  Diagnosis: lung cancer  Narrative The patient has undergone a complex simulation for the patient's upcoming boost treatment. The patient has been planned thusfar to receive a course of radiation treatment to a dose of 60 gray. The patient will now receive a boost to the high risk target volume for an additional 6 gray. This will be delivered in 3 fractions at 2 gray per fraction. To accomplish this, an additional 5 customized blocks have been designed for this purpose, and each of these complex treatment devices will be used on a daily basis. A complex isodose plan is requested to ensure that the high-risk target region receives the appropriate radiation dose and that the nearby normal structures continue to be appropriately spared. The patient's total dose after this boost treatment therefore will be 66 gray.    ________________________________   Radene Gunning, MD, PhD

## 2011-12-26 NOTE — Progress Notes (Signed)
  Radiation Oncology         [phone] ________________________________  Name: [name] [name] MRN: [id number]  Date: 11/20/2011  DOB: 1950/11/06  End of Treatment Note  Diagnosis:   Small cell lung cancer     Indication for treatment:  Curative       Radiation treatment dates:   10/07/2011 through 11/20/2011  Site/dose:   Patient was treated to the chest targeting the gross disease initially using a 3-D conformal technique. This utilized 5 fields. The patient in this manner received 60 gray in 30 fractions at 2 gray per fraction. She then received a 5 field boost treatment for an additional 6 Gray. The patient's final dose was 66 gray.  Narrative: The patient tolerated radiation treatment relatively well.   The patient experienced some esophagitis at the end of treatment and this was managed medically.  Plan: The patient has completed radiation treatment. The patient will return to radiation oncology clinic for routine followup in one month. I advised the patient to call or return sooner if they have any questions or concerns related to their recovery or treatment. ________________________________  Radene Gunning, M.D., Ph.D.

## 2011-12-31 ENCOUNTER — Ambulatory Visit
Admission: RE | Admit: 2011-12-31 | Discharge: 2011-12-31 | Disposition: A | Discharge status: Home / Self Care | Payer: BC Managed Care – PPO | Source: Ambulatory Visit | Attending: Radiation Oncology | Admitting: Radiation Oncology

## 2011-12-31 DIAGNOSIS — C3410 Malignant neoplasm of upper lobe, unspecified bronchus or lung: Secondary | ICD-10-CM | POA: Insufficient documentation

## 2012-01-01 NOTE — Progress Notes (Signed)
  Radiation Oncology         [phone]) [phone] ________________________________  Name: [name] [name] MRN: [id number]  Date: 12/31/2011  DOB: Jun 09, 1951   Simulation and treatment planning note  The patient presented for simulation for the patient's upcoming course of whole brain radiation treatment. The patient was placed in a supine position and a customized thermoplastic head cast was constructed to aid in patient immobilization during the treatment. This complex treatment device will be used on a daily basis. In this fashion a CT scan was obtained through the head and neck region and isocenter was placed near midline within the brain.  The patient will be planned to receive a course of whole brain radiation treatment to a dose of 25 gray in 10 fractions at 2.5 gray per fraction. To accomplish this, 2 customized blocks have been designed which corresponds to left and right whole brain radiation fields. These 2 complex treatment devices will be used on a daily basis during the course of radiation. A complex isodose plan is requested to insure that the target area is adequately covered in to facilitate optimization of the treatment plan. A forward planning technique will also be evaluated to determine if this approach significantly improves the plan.  ________________________________   Radene Gunning, MD, PhD

## 2012-01-06 ENCOUNTER — Ambulatory Visit
Admission: RE | Admit: 2012-01-06 | Discharge: 2012-01-06 | Disposition: A | Discharge status: Home / Self Care | Payer: BC Managed Care – PPO | Source: Ambulatory Visit | Attending: Radiation Oncology | Admitting: Radiation Oncology

## 2012-01-06 DIAGNOSIS — C3410 Malignant neoplasm of upper lobe, unspecified bronchus or lung: Secondary | ICD-10-CM

## 2012-01-07 ENCOUNTER — Ambulatory Visit
Admission: RE | Admit: 2012-01-07 | Discharge: 2012-01-07 | Disposition: A | Discharge status: Home / Self Care | Payer: BC Managed Care – PPO | Source: Ambulatory Visit | Attending: Radiation Oncology | Admitting: Radiation Oncology

## 2012-01-07 ENCOUNTER — Ambulatory Visit
Admission: RE | Admit: 2012-01-07 | Payer: BC Managed Care – PPO | Source: Ambulatory Visit | Admitting: Radiation Oncology

## 2012-01-08 ENCOUNTER — Encounter: Payer: Self-pay | Admitting: *Deleted

## 2012-01-08 ENCOUNTER — Ambulatory Visit
Admission: RE | Admit: 2012-01-08 | Discharge: 2012-01-08 | Disposition: A | Discharge status: Home / Self Care | Payer: BC Managed Care – PPO | Source: Ambulatory Visit | Attending: Radiation Oncology | Admitting: Radiation Oncology

## 2012-01-08 ENCOUNTER — Ambulatory Visit
Admission: RE | Admit: 2012-01-08 | Payer: BC Managed Care – PPO | Source: Ambulatory Visit | Attending: Radiation Oncology | Admitting: Radiation Oncology

## 2012-01-08 ENCOUNTER — Encounter: Payer: Self-pay | Admitting: Radiation Oncology

## 2012-01-08 VITALS — BP 116/70 | HR 92 | Resp 18 | Wt 136.3 lb

## 2012-01-08 DIAGNOSIS — C3410 Malignant neoplasm of upper lobe, unspecified bronchus or lung: Secondary | ICD-10-CM

## 2012-01-08 DIAGNOSIS — C3490 Malignant neoplasm of unspecified part of unspecified bronchus or lung: Secondary | ICD-10-CM

## 2012-01-08 MED ORDER — BIAFINE EX EMUL
CUTANEOUS | Status: DC | PRN
  Administered 2012-01-08: 16:00:00 via TOPICAL

## 2012-01-08 NOTE — Progress Notes (Signed)
Patient presents to the clinic today for an under treat visit with Dr. Dayton Scrape. Patient is alert and oriented to person, place, and time. No distress noted. Steady gait noted. Pleasant affect noted. Patient denies pain at this time. Patient reports a dull headache last night but, denies taking anything for it. Patient denies dizziness, diplopia, and hearing changes. Patient reports the left side of her jaw is sore. Patient denies nausea and vomiting. Patient reports diarrhea was worse today than normal. Reported all finding to Dr. Dayton Scrape.

## 2012-01-08 NOTE — Progress Notes (Signed)
Weekly Management Note:  Site:Whole Brain Current Dose:  500  cGy Projected Dose: 2500  cGy  Narrative: The patient is seen today for routine under treatment assessment. CBCT/MVCT images/port films were reviewed. The chart was reviewed.   She is without complaints today. She did have a mild headache last night for which he took Tylenol. She is a little more diarrhea than usual.  Physical Examination:  Filed Vitals:   01/08/12 1643  BP: 116/70  Pulse: 92  Resp: 18  .  Weight: 136 lb 4.8 oz (61.825 kg). No change.  Impression: Tolerating radiation therapy well.  Plan: Continue radiation therapy as planned.

## 2012-01-09 ENCOUNTER — Ambulatory Visit
Admission: RE | Admit: 2012-01-09 | Discharge: 2012-01-09 | Disposition: A | Discharge status: Home / Self Care | Payer: BC Managed Care – PPO | Source: Ambulatory Visit | Attending: Radiation Oncology | Admitting: Radiation Oncology

## 2012-01-09 MED ORDER — BIAFINE EX EMUL
Freq: Every day | CUTANEOUS | Status: DC
  Administered 2012-01-09: 17:00:00 via TOPICAL

## 2012-01-09 NOTE — Progress Notes (Signed)
Late entry from 01/08/2012 at 1647. Received patient in the clinic following treatment for an under treat visit and post sim education. Reinforced routine of the clinic. Provided patient with RADIATION THERAPY AND YOU handbook then, reviewed pertinent information. Provided patient with tube of Biafine and reviewed use. Provided patient with this writer's business card and encouraged to call with needs. All questions answered. Patient verbalized understanding of all things reviewed.

## 2012-01-12 ENCOUNTER — Ambulatory Visit
Admission: RE | Admit: 2012-01-12 | Discharge: 2012-01-12 | Disposition: A | Discharge status: Home / Self Care | Payer: BC Managed Care – PPO | Source: Ambulatory Visit | Attending: Radiation Oncology | Admitting: Radiation Oncology

## 2012-01-13 ENCOUNTER — Ambulatory Visit
Admission: RE | Admit: 2012-01-13 | Discharge: 2012-01-13 | Disposition: A | Discharge status: Home / Self Care | Payer: BC Managed Care – PPO | Source: Ambulatory Visit | Attending: Radiation Oncology | Admitting: Radiation Oncology

## 2012-01-14 ENCOUNTER — Ambulatory Visit
Admission: RE | Admit: 2012-01-14 | Discharge: 2012-01-14 | Disposition: A | Discharge status: Home / Self Care | Payer: BC Managed Care – PPO | Source: Ambulatory Visit | Attending: Radiation Oncology | Admitting: Radiation Oncology

## 2012-01-15 ENCOUNTER — Ambulatory Visit
Admission: RE | Admit: 2012-01-15 | Discharge: 2012-01-15 | Disposition: A | Discharge status: Home / Self Care | Payer: BC Managed Care – PPO | Source: Ambulatory Visit | Attending: Radiation Oncology | Admitting: Radiation Oncology

## 2012-01-15 ENCOUNTER — Encounter: Payer: Self-pay | Admitting: *Deleted

## 2012-01-15 VITALS — BP 119/79 | HR 83 | Temp 97.4°F | Wt 135.8 lb

## 2012-01-15 DIAGNOSIS — C3410 Malignant neoplasm of upper lobe, unspecified bronchus or lung: Secondary | ICD-10-CM

## 2012-01-15 NOTE — Progress Notes (Signed)
Here for routine weekly md visit for prophylactic brain radiation. No headaches or nausea.

## 2012-01-15 NOTE — Progress Notes (Signed)
Department of Radiation Oncology  Phone:  [phone] Fax:        [phone]  Weekly Treatment Note    Name: [name] [name] Date: 01/15/2012 MRN: [id number] DOB: 1951/05/10   Current dose: 17.5 Gy  Current fraction: 7   MEDICATIONS: Current Outpatient Prescriptions  Medication Sig Dispense Refill  . ALPRAZolam (XANAX) 0.5 MG tablet Take 0.5 tablets (0.25 mg total) by mouth 3 (three) times daily as needed. For anxiety.  90 tablet  0  . aspirin 81 MG tablet Take 81 mg by mouth daily.       Marland Kitchen atorvastatin (LIPITOR) 80 MG tablet TAKE 1 TABLET BY MOUTH EVERY DAY  30 tablet  0  . citalopram (CELEXA) 20 MG tablet Take 1 tablet (20 mg total) by mouth daily.  30 tablet  6  . diphenhydrAMINE (BENADRYL) 25 MG tablet Take 25 mg by mouth every 6 (six) hours as needed.      Marland Kitchen emollient (BIAFINE) cream Apply topically as needed.      Marland Kitchen lisinopril (PRINIVIL,ZESTRIL) 20 MG tablet TAKE 1 TABLET BY MOUTH ONCE DAILY  90 tablet  3  . loratadine (CLARITIN) 10 MG tablet Take 10 mg by mouth daily. X 5 days after neulasta injection      . omeprazole (PRILOSEC) 20 MG capsule Take 20 mg by mouth daily.      . prochlorperazine (COMPAZINE) 10 MG tablet       . ZETIA 10 MG tablet TAKE ONE TABLET BY MOUTH ONCE DAILY  90 tablet  0  . DISCONTD: sucralfate (CARAFATE) 1 G tablet Take 1 tablet (1 g total) by mouth 4 (four) times daily. Dissolve tablet in 15cc water.  120 tablet  1   No current facility-administered medications for this encounter.   Facility-Administered Medications Ordered in Other Encounters  Medication Dose Route Frequency Provider Last Rate Last Dose  . topical emolient (BIAFINE) emulsion   Topical PRN Jonna Coup, MD      . topical emolient (BIAFINE) emulsion   Topical PRN Jonna Coup, MD      . topical emolient (BIAFINE) emulsion   Topical Daily Jonna Coup, MD         ALLERGIES: Codeine and Penicillins   LABORATORY DATA:  Lab Results  Component Value  Date   WBC 8.8 12/24/2011   HGB 8.9* 12/24/2011   HCT 26.3* 12/24/2011   MCV 95.9 12/24/2011   PLT 201 12/24/2011   Lab Results  Component Value Date   NA 135 12/24/2011   K 4.6 12/24/2011   CL 98 12/24/2011   CO2 25 12/24/2011   Lab Results  Component Value Date   ALT 9 12/24/2011   AST 15 12/24/2011   ALKPHOS 75 12/24/2011   BILITOT 0.2* 12/24/2011     NARRATIVE: [name] [name] was seen today for weekly treatment management. The chart was checked and the patient's films were reviewed. The patient is doing very well. She has no major complaints today. No headaches and no nausea.  PHYSICAL EXAMINATION: weight is 135 lb 12.8 oz (61.598 kg). Her temperature is 97.4 F (36.3 C). Her blood pressure is 119/79 and her pulse is 83. Her oxygen saturation is 100%.        ASSESSMENT: The patient is doing satisfactorily with treatment.  PLAN: We will continue with the patient's radiation treatment as planned. Patient will followup in one month with me after she finishes her final fraction.

## 2012-01-16 ENCOUNTER — Ambulatory Visit
Admission: RE | Admit: 2012-01-16 | Discharge: 2012-01-16 | Disposition: A | Discharge status: Home / Self Care | Payer: BC Managed Care – PPO | Source: Ambulatory Visit | Attending: Radiation Oncology | Admitting: Radiation Oncology

## 2012-01-20 ENCOUNTER — Ambulatory Visit
Admission: RE | Admit: 2012-01-20 | Discharge: 2012-01-20 | Disposition: A | Discharge status: Home / Self Care | Payer: BC Managed Care – PPO | Source: Ambulatory Visit | Attending: Radiation Oncology | Admitting: Radiation Oncology

## 2012-01-21 ENCOUNTER — Ambulatory Visit
Admission: RE | Admit: 2012-01-21 | Discharge: 2012-01-21 | Disposition: A | Discharge status: Home / Self Care | Payer: BC Managed Care – PPO | Source: Ambulatory Visit | Attending: Radiation Oncology | Admitting: Radiation Oncology

## 2012-01-21 DIAGNOSIS — C3410 Malignant neoplasm of upper lobe, unspecified bronchus or lung: Secondary | ICD-10-CM

## 2012-01-23 NOTE — Progress Notes (Signed)
Encounter addended by: Amanda Pea, RN on: 01/23/2012  4:26 PM<BR>     Documentation filed: Charges VN

## 2012-01-29 ENCOUNTER — Telehealth: Payer: Self-pay | Admitting: *Deleted

## 2012-01-29 NOTE — Progress Notes (Signed)
  Radiation Oncology         [phone]) [phone] ________________________________  Name: [name] [name] MRN: [id number]  Date: 01/06/2012  DOB: [birth year]  Simulation Verification Note   NARRATIVE: The patient was brought to the treatment unit and placed in the planned treatment position. The clinical setup was verified. Then port films were obtained and uploaded to the radiation oncology medical record software.  The treatment beams were carefully compared against the planned radiation fields. The position, location, and shape of the radiation fields was reviewed. The targeted volume of tissue appears to be appropriately covered by the radiation beams. Based on my personal review, I approved the simulation verification. The patient's treatment will proceed as planned.  ________________________________   Radene Gunning, MD, PhD

## 2012-01-29 NOTE — Telephone Encounter (Signed)
Patient had called left voice message has been having dry hacking cough , no fever, no shortness breath,  Stating "she never had that before, was a little concerned as well as her friends suggesting she call and ask", her f/u visit is 02/27/12, spoke with Dr.Moody, he suggested she take something over the counter for the cough if no fever or shortness breath, called patient back, and informed patient she can be seen if continues after taking otc cough medication, next 2 weeks, or can call Dr.Mohamed office if she feels she needs to get a  Ct scan earlier than 03/23/12, patient stated she didn't need to call Dr.Mohamed office, she will just see Dr.Moody 02/27/12, felt better after calling 12:57 PM

## 2012-01-29 NOTE — Telephone Encounter (Signed)
Pt called stating that she has been having a dry cough and wanted to know if her cancer was back.  Per Dr Donnald Garre, it is r/t radiation, informed pt, she will call Dr Joellen Jersey office.  SLJ

## 2012-01-29 NOTE — Progress Notes (Signed)
  Radiation Oncology         [phone] ________________________________  Name: [name] [name] MRN: [id number]  Date: 01/21/2012  DOB: [birth year]  End of Treatment Note  Diagnosis:   Small cell lung cancer     Indication for treatment:  Curative       Radiation treatment dates:   01/07/2012 through 01/21/2012  Site/dose:   The patient was treated with a course of prophylactic cranial irradiation. This was given to a dose of 25 gray in 10 fractions. Left and right whole brain radiation fields were utilized.  Narrative: The patient tolerated radiation treatment relatively well.   She denies it at any significant acute toxicity or unexpected problems during the treatment.  Plan: The patient has completed radiation treatment. The patient will return to radiation oncology clinic for routine followup in one month. I advised the patient to call or return sooner if they have any questions or concerns related to their recovery or treatment. ________________________________  Radene Gunning, M.D., Ph.D.

## 2012-02-17 ENCOUNTER — Encounter: Payer: Self-pay | Admitting: Radiation Oncology

## 2012-02-27 ENCOUNTER — Ambulatory Visit
Admission: RE | Admit: 2012-02-27 | Discharge: 2012-02-27 | Disposition: A | Discharge status: Home / Self Care | Payer: BC Managed Care – PPO | Source: Ambulatory Visit | Attending: Radiation Oncology | Admitting: Radiation Oncology

## 2012-02-27 ENCOUNTER — Encounter: Payer: Self-pay | Admitting: Radiation Oncology

## 2012-02-27 VITALS — BP 114/66 | HR 75 | Temp 98.6°F | Resp 20 | Wt 137.4 lb

## 2012-02-27 DIAGNOSIS — C3410 Malignant neoplasm of upper lobe, unspecified bronchus or lung: Secondary | ICD-10-CM

## 2012-02-27 NOTE — Progress Notes (Signed)
Patient here f/u prophylatic cranial irradiation, still has coughing small pheglm, brownish color, coffee grounds, small amount, coughs and thinks she might throw up but doesn't no nausea, eating well 10:08 AM

## 2012-02-28 NOTE — Progress Notes (Signed)
Radiation Oncology         [phone] ________________________________  Name: [name] [name] MRN: [id number]  Date: 02/28/2012  DOB: [birth year]  Follow-Up Visit Note  CC: [name], MD  [name], MD  Diagnosis:   Small cell lung cancer  Interval Since Last Radiation:  One month, PCI.   Narrative:  The patient returns today for routine follow-up.  She completed her course of prophylactic cranial irradiation on 01/21/2012. The patient indicates that she has done well since that time. She has continued to have an occasional cough. She does have a number of questions today related to her overall prognosis and where things stand at this point. Thus far I believe that she has done well and she had a reasonable response to her initial treatment and we have completed PCI. She is now scheduled for ongoing followup with a CT scan scheduled later this month with subsequent followup with Dr. Arbutus Ped.                              ALLERGIES:  is allergic to codeine and penicillins.  Meds: Current Outpatient Prescriptions  Medication Sig Dispense Refill  . ALPRAZolam (XANAX) 0.5 MG tablet Take 0.5 tablets (0.25 mg total) by mouth 3 (three) times daily as needed. For anxiety.  90 tablet  0  . aspirin 81 MG tablet Take 81 mg by mouth daily.       Marland Kitchen atorvastatin (LIPITOR) 80 MG tablet TAKE 1 TABLET BY MOUTH EVERY DAY  30 tablet  0  . citalopram (CELEXA) 20 MG tablet Take 1 tablet (20 mg total) by mouth daily.  30 tablet  6  . diphenhydrAMINE (BENADRYL) 25 MG tablet Take 25 mg by mouth every 6 (six) hours as needed.      Marland Kitchen emollient (BIAFINE) cream Apply topically as needed.      Marland Kitchen lisinopril (PRINIVIL,ZESTRIL) 20 MG tablet TAKE 1 TABLET BY MOUTH ONCE DAILY  90 tablet  3  . loratadine (CLARITIN) 10 MG tablet Take 10 mg by mouth daily. X 5 days after neulasta injection      . omeprazole (PRILOSEC) 20 MG capsule Take 20 mg by mouth daily.      . prochlorperazine (COMPAZINE) 10 MG tablet         . ZETIA 10 MG tablet TAKE ONE TABLET BY MOUTH ONCE DAILY  90 tablet  0  . DISCONTD: sucralfate (CARAFATE) 1 G tablet Take 1 tablet (1 g total) by mouth 4 (four) times daily. Dissolve tablet in 15cc water.  120 tablet  1   No current facility-administered medications for this encounter.   Facility-Administered Medications Ordered in Other Encounters  Medication Dose Route Frequency Provider Last Rate Last Dose  . topical emolient (BIAFINE) emulsion   Topical PRN Jonna Coup, MD      . topical emolient (BIAFINE) emulsion   Topical PRN Jonna Coup, MD      . topical emolient (BIAFINE) emulsion   Topical Daily Jonna Coup, MD        Physical Findings: The patient is in no acute distress. Patient is alert and oriented.  weight is 137 lb 6.4 oz (62.324 kg). Her oral temperature is 98.6 F (37 C). Her blood pressure is 114/66 and her pulse is 75. Her respiration is 20. .   The patient's skin looks good, alopecia  Lab Findings: Lab Results  Component Value Date  WBC 8.8 12/24/2011   HGB 8.9* 12/24/2011   HCT 26.3* 12/24/2011   MCV 95.9 12/24/2011   PLT 201 12/24/2011     Radiographic Findings: No results found.  Impression:    Pleasant 61 year old female with limited stage small cell lung cancer now status post chemoradiotherapy and PCI  Plan:  As noted above she is scheduled for repeat imaging later this month with followup with Dr. Shirline Frees. I'm going to have the patient return to our clinic in 6 months for ongoing followup.   Radene Gunning, M.D., Ph.D.

## 2012-03-04 ENCOUNTER — Telehealth: Payer: Self-pay | Admitting: Internal Medicine

## 2012-03-04 NOTE — Telephone Encounter (Signed)
Caller: [name] [name]/Patient; PCP: [name]; CB#: [phone]; ; ; Call regarding Cough/Congestion;  Pt calling regarding cough congestion, fever, HA that started 03/03/12. Cough started since 12/2011, dry hacky cough. Last had radiation in May 2013, in remission for lung cancer. Current temp 100. Spoke with her Oncologist for cough and did not seemed concerned. Has took Claritin, ES Tylenol with some relief. Emergent s/s for Upper Respiratory Infection r/o per protocol except for see immediately due to any temp elevation in an immunocompromised pt, will proceed to UC.

## 2012-03-15 ENCOUNTER — Telehealth: Payer: Self-pay | Admitting: Medical Oncology

## 2012-03-15 NOTE — Telephone Encounter (Signed)
Off chemo since may . She has been " sick " since July 11 with fever 101.6 . Went to Urgent care and put on Levaquin -she took 8/10 tablets. She stopped it because she felt worse. Today she says she does not have any energy .but feels better . I instructed her to call her PCP or urgent care if fever returns or feels worse and to keep appointments at [facility].She voices understanding.

## 2012-03-22 ENCOUNTER — Other Ambulatory Visit: Payer: Self-pay | Admitting: *Deleted

## 2012-03-22 ENCOUNTER — Other Ambulatory Visit: Payer: BC Managed Care – PPO | Admitting: Lab

## 2012-03-22 NOTE — Progress Notes (Signed)
Pt states she is very fatigued, cannot eat, everything tastes awful.  She states has lost 10 Ibs and does not know what to do.  Per Dr Donnald Garre, pt needs to keep lab/CT as scheduled and will see pt for f/u on 7/30 to assess whether she has any disease progression. Per Dr Donnald Garre, symptoms sound like they may be r/t brain radiation.  SLJ

## 2012-03-23 ENCOUNTER — Ambulatory Visit (HOSPITAL_COMMUNITY)
Admission: RE | Admit: 2012-03-23 | Discharge: 2012-03-23 | Disposition: A | Discharge status: Home / Self Care | Payer: BC Managed Care – PPO | Source: Ambulatory Visit | Attending: Internal Medicine | Admitting: Internal Medicine

## 2012-03-23 ENCOUNTER — Other Ambulatory Visit (HOSPITAL_BASED_OUTPATIENT_CLINIC_OR_DEPARTMENT_OTHER): Payer: BC Managed Care – PPO

## 2012-03-23 ENCOUNTER — Telehealth: Payer: Self-pay | Admitting: Internal Medicine

## 2012-03-23 ENCOUNTER — Encounter (HOSPITAL_COMMUNITY): Payer: Self-pay

## 2012-03-23 DIAGNOSIS — Z9221 Personal history of antineoplastic chemotherapy: Secondary | ICD-10-CM | POA: Insufficient documentation

## 2012-03-23 DIAGNOSIS — R634 Abnormal weight loss: Secondary | ICD-10-CM | POA: Insufficient documentation

## 2012-03-23 DIAGNOSIS — C3410 Malignant neoplasm of upper lobe, unspecified bronchus or lung: Secondary | ICD-10-CM

## 2012-03-23 DIAGNOSIS — Z923 Personal history of irradiation: Secondary | ICD-10-CM | POA: Insufficient documentation

## 2012-03-23 DIAGNOSIS — C3490 Malignant neoplasm of unspecified part of unspecified bronchus or lung: Secondary | ICD-10-CM

## 2012-03-23 DIAGNOSIS — J984 Other disorders of lung: Secondary | ICD-10-CM | POA: Insufficient documentation

## 2012-03-23 DIAGNOSIS — I2510 Atherosclerotic heart disease of native coronary artery without angina pectoris: Secondary | ICD-10-CM | POA: Insufficient documentation

## 2012-03-23 LAB — CMP (CANCER CENTER ONLY)
AST: 18 U/L (ref 11–38)
Alkaline Phosphatase: 48 U/L (ref 26–84)
BUN, Bld: 10 mg/dL (ref 7–22)
Creat: 1 mg/dl (ref 0.6–1.2)
Glucose, Bld: 104 mg/dL (ref 73–118)
Total Bilirubin: 0.6 mg/dl (ref 0.20–1.60)

## 2012-03-23 LAB — CBC WITH DIFFERENTIAL/PLATELET
Basophils Absolute: 0 10*3/uL (ref 0.0–0.1)
EOS%: 1.3 % (ref 0.0–7.0)
HCT: 32.9 % — ABNORMAL LOW (ref 34.8–46.6)
HGB: 11.1 g/dL — ABNORMAL LOW (ref 11.6–15.9)
LYMPH%: 11 % — ABNORMAL LOW (ref 14.0–49.7)
MCH: 31.3 pg (ref 25.1–34.0)
MCV: 92.5 fL (ref 79.5–101.0)
MONO%: 9.4 % (ref 0.0–14.0)
NEUT%: 78 % — ABNORMAL HIGH (ref 38.4–76.8)
Platelets: 201 10*3/uL (ref 145–400)
RDW: 11.8 % (ref 11.2–14.5)

## 2012-03-23 MED ORDER — IOHEXOL 300 MG/ML  SOLN
80.0000 mL | Freq: Once | INTRAMUSCULAR | Status: AC | PRN
  Administered 2012-03-23: 80 mL via INTRAVENOUS

## 2012-03-23 NOTE — Telephone Encounter (Signed)
called pt with lab appt for today as well as 9:00am appt on 7/31   aom

## 2012-03-24 ENCOUNTER — Encounter: Payer: Self-pay | Admitting: *Deleted

## 2012-03-24 ENCOUNTER — Ambulatory Visit (HOSPITAL_BASED_OUTPATIENT_CLINIC_OR_DEPARTMENT_OTHER): Payer: BC Managed Care – PPO | Admitting: Internal Medicine

## 2012-03-24 ENCOUNTER — Telehealth: Payer: Self-pay | Admitting: Internal Medicine

## 2012-03-24 VITALS — BP 105/76 | HR 75 | Temp 97.0°F | Ht 63.0 in | Wt 129.8 lb

## 2012-03-24 DIAGNOSIS — C3490 Malignant neoplasm of unspecified part of unspecified bronchus or lung: Secondary | ICD-10-CM

## 2012-03-24 DIAGNOSIS — C3410 Malignant neoplasm of upper lobe, unspecified bronchus or lung: Secondary | ICD-10-CM

## 2012-03-24 NOTE — Telephone Encounter (Signed)
appts made and printed for pt aom °

## 2012-03-24 NOTE — Progress Notes (Signed)
Spoke with Ms. Danis today at [facility] questions and concerns addressed

## 2012-03-24 NOTE — Progress Notes (Signed)
[facility] [phone]   [phone]  OFFICE PROGRESS NOTE  [name], MD [phone] [address] [facility] [address] [place] [address]  DIAGNOSIS: Limited stage small cell lung cancer   PRIOR THERAPY:  1) Systemic chemotherapy with cisplatin at 60 mg per meter square given on day 1 and etoposide at 120 mg per meter square given on days one 2 and 3 with Neulasta support given on day 4 status post 4 cycles, last cycle was given on 12/02/2011 . This with concurrent with radiotherapy under the care of Dr. Mitzi Hansen.  2) prophylactic cranial irradiation under the care of Dr. Mitzi Hansen completed on 01/21/2012  CURRENT THERAPY: None.  INTERVAL HISTORY: [name] [name] 61 y.o. female returns to the clinic today for routine three-month followup visit. The patient has been complaining of increasing fatigue and weakness recently. She was dealing with her sick mother. She continues to have some dry cough. She has no significant chest pain or shortness breath and no hemoptysis. She has no significant weight loss or night sweats. The patient has repeat CT scan of the chest performed recently and she is here today for evaluation and discussion of her scan results.  MEDICAL HISTORY: Past Medical History  Diagnosis Date  . Hypertension   . High cholesterol   . Anxiety   . Osteoporosis   . Lung mass 09/19/11    small cell carcinoma  . Osteoporosis   . Hx of radiation therapy 10/07/11 to 11/20/11    right lung  . Hx of radiation therapy 01/07/2012- 01/21/12    cranial irradiation  . Lung cancer 09/19/11    SMALL CELL CARCINOMA    ALLERGIES:  is allergic to codeine and penicillins.  MEDICATIONS:  Current Outpatient Prescriptions  Medication Sig Dispense Refill  . aspirin 81 MG tablet Take 81 mg by mouth daily.       . citalopram (CELEXA) 20 MG tablet Take 1 tablet (20 mg total) by mouth daily.  30 tablet  6  . lisinopril (PRINIVIL,ZESTRIL) 20 MG tablet  TAKE 1 TABLET BY MOUTH ONCE DAILY  90 tablet  3  . ALPRAZolam (XANAX) 0.5 MG tablet Take 0.5 tablets (0.25 mg total) by mouth 3 (three) times daily as needed. For anxiety.  90 tablet  0  . emollient (BIAFINE) cream Apply topically as needed.      Marland Kitchen DISCONTD: sucralfate (CARAFATE) 1 G tablet Take 1 tablet (1 g total) by mouth 4 (four) times daily. Dissolve tablet in 15cc water.  120 tablet  1   No current facility-administered medications for this visit.   Facility-Administered Medications Ordered in Other Visits  Medication Dose Route Frequency Provider Last Rate Last Dose  . iohexol (OMNIPAQUE) 300 MG/ML solution 80 mL  80 mL Intravenous Once PRN Medication Radiologist, MD   80 mL at 03/23/12 1415  . topical emolient (BIAFINE) emulsion   Topical PRN Jonna Coup, MD      . topical emolient (BIAFINE) emulsion   Topical PRN Jonna Coup, MD      . topical emolient (BIAFINE) emulsion   Topical Daily Jonna Coup, MD        SURGICAL HISTORY:  Past Surgical History  Procedure Date  . Tubal ligation   . Cholecystectomy 04/2011    biliary stent placement    REVIEW OF SYSTEMS:  A comprehensive review of systems was negative except for: Constitutional: positive for fatigue Respiratory: positive for cough  PHYSICAL EXAMINATION: General appearance: alert, cooperative and no distress Head: Normocephalic, without obvious abnormality, atraumatic Neck: no adenopathy Lymph nodes: Cervical, supraclavicular, and axillary nodes normal. Resp: clear to auscultation bilaterally Cardio: regular rate and rhythm, S1, S2 normal, no murmur, click, rub or gallop GI: soft, non-tender; bowel sounds normal; no masses,  no organomegaly Extremities: extremities normal, atraumatic, no cyanosis or edema Neurologic: Alert and oriented X 3, normal strength and tone. Normal symmetric reflexes. Normal coordination and gait  ECOG PERFORMANCE STATUS: 1 - Symptomatic but completely ambulatory  Blood  pressure 105/76, pulse 75, temperature 97 F (36.1 C), temperature source Oral, height 5\' 3"  (1.6 m), weight 129 lb 12.8 oz (58.877 kg).  LABORATORY DATA: Lab Results  Component Value Date   WBC 4.8 03/23/2012   HGB 11.1* 03/23/2012   HCT 32.9* 03/23/2012   MCV 92.5 03/23/2012   PLT 201 03/23/2012      Chemistry      Component Value Date/Time   NA 135 12/24/2011 1124   NA 138 12/23/2011 0822   K 4.6 12/24/2011 1124   K 5.5* 12/23/2011 0822   CL 98 12/24/2011 1124   CL 99 12/23/2011 0822   CO2 25 12/24/2011 1124   CO2 26 12/23/2011 0822   BUN 18 12/24/2011 1124   BUN 15 12/23/2011 0822   CREATININE 1.10 12/24/2011 1124   CREATININE 1.2 12/23/2011 0822      Component Value Date/Time   CALCIUM 9.8 12/24/2011 1124   CALCIUM 8.8 12/23/2011 0822   ALKPHOS 75 12/24/2011 1124   ALKPHOS 71 12/23/2011 0822   AST 15 12/24/2011 1124   AST 15 12/23/2011 0822   ALT 9 12/24/2011 1124   BILITOT 0.2* 12/24/2011 1124   BILITOT 0.60 12/23/2011 0822       RADIOGRAPHIC STUDIES: Ct Chest W Contrast  03/23/2012  *RADIOLOGY REPORT*  Clinical Data: Lung cancer status post chemotherapy and radiation therapy completed in April.  Weight loss and decreased appetite.  CT CHEST WITH CONTRAST  Technique:  Multidetector CT imaging of the chest was performed following the standard protocol during bolus administration of intravenous contrast.  Contrast: 80mL OMNIPAQUE IOHEXOL 300 MG/ML  SOLN  Comparison: Chest CT 12/23/2011 and 11/07/2011.  Findings: There are no enlarged mediastinal or hilar lymph nodes. There is no pleural or pericardial effusion.  Coronary artery calcifications are stable.  Right greater than left biapical scarring appears stable.  There is increased subpleural density posteriorly in the right upper lobe superior to the major fissure.  In addition, there is a new similar area of focal parenchymal opacity in the superior segment of the right lower lobe with air bronchograms and central cavitation. This latter density  measures approximately 3.6 cm transverse.  Both densities have relatively sharp lateral margins and are probably areas of radiation pneumonitis/early fibrosis. No well-defined nodule or endobronchial lesion is seen.  The left lung is clear.  The visualized upper abdomen appears unremarkable.  There are no suspicious osseous findings.  IMPRESSION:  1.  Increased focal parenchymal opacities medially in the upper right lung involving the posterior segment of the upper and superior segment of the lower lobes.  Distribution is most compatible with radiation pneumonitis/early fibrosis.  If outside the radiation port, this could reflect pneumonia.  Radiographic followup recommended. 2.  No discrete mass or endobronchial lesion. 3.  No recurrent adenopathy or pleural effusion.  Original Report Authenticated By: Gerrianne Scale, M.D.    ASSESSMENT: This is a very pleasant 61 years old white  female with history of limited stage small cell lung cancer status post a course of concurrent chemoradiation followed by prophylactic cranial irradiation. The patient is doing fine with no evidence for disease recurrence.  PLAN: I discussed the scan results with the patient and recommended for her to continue on observation for now. I would see her back for followup visit in 3 months with repeat CT scan of the chest with contrast. The patient was advised to call me immediately if she has any concerning symptoms in the interval.  All questions were answered. The patient knows to call the clinic with any problems, questions or concerns. We can certainly see the patient much sooner if necessary.

## 2012-03-25 ENCOUNTER — Ambulatory Visit: Payer: BC Managed Care – PPO | Admitting: Internal Medicine

## 2012-03-29 ENCOUNTER — Emergency Department (HOSPITAL_COMMUNITY): Payer: BC Managed Care – PPO

## 2012-03-29 ENCOUNTER — Emergency Department (HOSPITAL_COMMUNITY)
Admission: EM | Admit: 2012-03-29 | Discharge: 2012-03-30 | Disposition: A | Discharge status: Home / Self Care | Payer: BC Managed Care – PPO | Attending: Emergency Medicine | Admitting: Emergency Medicine

## 2012-03-29 ENCOUNTER — Encounter (HOSPITAL_COMMUNITY): Payer: Self-pay | Admitting: *Deleted

## 2012-03-29 DIAGNOSIS — E871 Hypo-osmolality and hyponatremia: Secondary | ICD-10-CM | POA: Insufficient documentation

## 2012-03-29 DIAGNOSIS — I10 Essential (primary) hypertension: Secondary | ICD-10-CM | POA: Insufficient documentation

## 2012-03-29 DIAGNOSIS — R5381 Other malaise: Secondary | ICD-10-CM | POA: Insufficient documentation

## 2012-03-29 DIAGNOSIS — Z87891 Personal history of nicotine dependence: Secondary | ICD-10-CM | POA: Insufficient documentation

## 2012-03-29 DIAGNOSIS — M810 Age-related osteoporosis without current pathological fracture: Secondary | ICD-10-CM | POA: Insufficient documentation

## 2012-03-29 DIAGNOSIS — R531 Weakness: Secondary | ICD-10-CM

## 2012-03-29 DIAGNOSIS — Z880 Allergy status to penicillin: Secondary | ICD-10-CM | POA: Insufficient documentation

## 2012-03-29 DIAGNOSIS — Z79899 Other long term (current) drug therapy: Secondary | ICD-10-CM | POA: Insufficient documentation

## 2012-03-29 DIAGNOSIS — E7800 Pure hypercholesterolemia, unspecified: Secondary | ICD-10-CM | POA: Insufficient documentation

## 2012-03-29 DIAGNOSIS — C3490 Malignant neoplasm of unspecified part of unspecified bronchus or lung: Secondary | ICD-10-CM | POA: Insufficient documentation

## 2012-03-29 LAB — POCT I-STAT, CHEM 8
Calcium, Ion: 1.19 mmol/L (ref 1.12–1.23)
Chloride: 101 mEq/L (ref 96–112)
Creatinine, Ser: 1 mg/dL (ref 0.50–1.10)
Glucose, Bld: 99 mg/dL (ref 70–99)
HCT: 33 % — ABNORMAL LOW (ref 36.0–46.0)
Potassium: 3.8 mEq/L (ref 3.5–5.1)

## 2012-03-29 LAB — CBC WITH DIFFERENTIAL/PLATELET
Basophils Relative: 0 % (ref 0–1)
Eosinophils Absolute: 0.1 10*3/uL (ref 0.0–0.7)
Eosinophils Relative: 3 % (ref 0–5)
MCH: 30.8 pg (ref 26.0–34.0)
MCHC: 35.1 g/dL (ref 30.0–36.0)
MCV: 87.8 fL (ref 78.0–100.0)
Neutrophils Relative %: 75 % (ref 43–77)
Platelets: 177 10*3/uL (ref 150–400)

## 2012-03-29 LAB — URINALYSIS, ROUTINE W REFLEX MICROSCOPIC
Bilirubin Urine: NEGATIVE
Hgb urine dipstick: NEGATIVE
Ketones, ur: NEGATIVE mg/dL
Nitrite: NEGATIVE
pH: 6 (ref 5.0–8.0)

## 2012-03-29 LAB — PROTIME-INR
INR: 0.91 (ref 0.00–1.49)
Prothrombin Time: 12.4 seconds (ref 11.6–15.2)

## 2012-03-29 LAB — URINE MICROSCOPIC-ADD ON

## 2012-03-29 MED ORDER — SODIUM CHLORIDE 0.9 % IV BOLUS (SEPSIS)
1000.0000 mL | Freq: Once | INTRAVENOUS | Status: AC
  Administered 2012-03-29: 1000 mL via INTRAVENOUS

## 2012-03-29 NOTE — ED Notes (Signed)
Pt states had infection, fever in July and has felt bad since then.  CAT scan with oncologist about 10 days ago.  Told to come to ER from PCP.  States just feels exhausted- stressed about parents.  States friend noticed today she was dizzy.  States wants to sleep all the time.  States is not depressed.  States does not feel good.

## 2012-03-29 NOTE — ED Provider Notes (Signed)
History     CSN: [id number]  Arrival date & time 03/29/12  2023   First MD Initiated Contact with Patient 03/29/12 2201      Chief Complaint  Patient presents with  . Hypotension  . Fatigue    (Consider location/radiation/quality/duration/timing/severity/associated sxs/prior treatment) HPI Comments: Mr. [name] [name] a 61 year old lady, who has small cell lung cancer, who has completed.  Her first round of chemotherapy.  Her CT scan showed that she had a pneumonitis, but decrease in her tumor size.  She is followed every 3 months by Dr. Shirline Frees at this point.  She states that last month.  She has had increasing fatigue, decreased appetite, significant weight loss of 10-12 pounds.  She states about 3, weeks, ago.  She was seen in urgent care and diagnosed with a pneumonia and took antibiotics for one week she has not felt well since  The history is provided by the patient.    Past Medical History  Diagnosis Date  . Hypertension   . High cholesterol   . Anxiety   . Osteoporosis   . Lung mass 09/19/11    small cell carcinoma  . Osteoporosis   . Hx of radiation therapy 10/07/11 to 11/20/11    right lung  . Hx of radiation therapy 01/07/2012- 01/21/12    cranial irradiation  . Lung cancer 09/19/11    SMALL CELL CARCINOMA    Past Surgical History  Procedure Date  . Tubal ligation   . Cholecystectomy 04/2011    biliary stent placement    Family History  Problem Relation Age of Onset  . Cancer Father     bladder  . Stroke Father   . Atrial fibrillation Mother     History  Substance Use Topics  . Smoking status: Former Smoker    Types: Cigarettes    Quit date: 09/05/2011  . Smokeless tobacco: Former Neurosurgeon    Quit date: 09/01/2011  . Alcohol Use: Yes     occassional    OB History    Grav Para Term Preterm Abortions TAB SAB Ect Mult Living                  Review of Systems  Constitutional: Positive for appetite change and fatigue. Negative for fever and chills.  HENT:  Negative for rhinorrhea.   Respiratory: Negative for cough and shortness of breath.   Gastrointestinal: Negative for abdominal pain.  Skin: Negative for rash and wound.  Neurological: Positive for dizziness and weakness. Negative for headaches.  Psychiatric/Behavioral: Negative for decreased concentration. The patient is nervous/anxious.     Allergies  Codeine and Penicillins  Home Medications   Current Outpatient Rx  Name Route Sig Dispense Refill  . ALPRAZOLAM 0.5 MG PO TABS Oral Take 0.25 mg by mouth 3 (three) times daily as needed. For anxiety.    . ASPIRIN 81 MG PO TABS Oral Take 81 mg by mouth daily.     Marland Kitchen CITALOPRAM HYDROBROMIDE 20 MG PO TABS Oral Take 20 mg by mouth daily.    Marland Kitchen LISINOPRIL 20 MG PO TABS        BP 113/78  Pulse 77  Temp 98.3 F (36.8 C) (Oral)  Resp 17  SpO2 100%  Physical Exam  Constitutional: She appears well-developed and well-nourished. No distress.  Eyes: Pupils are equal, round, and reactive to light.  Neck: Normal range of motion.  Cardiovascular: Normal rate.   Abdominal: Soft. She exhibits no distension.  Musculoskeletal: Normal range of motion.  Neurological: She is alert.  Skin: Skin is warm. No rash noted.  Psychiatric: Her behavior is normal.    ED Course  Procedures (including critical care time)  Labs Reviewed  CBC WITH DIFFERENTIAL - Abnormal; Notable for the following:    RBC 3.60 (*)     Hemoglobin 11.1 (*)     HCT 31.6 (*)     Lymphs Abs 0.6 (*)     All other components within normal limits  URINALYSIS, ROUTINE W REFLEX MICROSCOPIC - Abnormal; Notable for the following:    Leukocytes, UA TRACE (*)     All other components within normal limits  POCT I-STAT, CHEM 8 - Abnormal; Notable for the following:    Sodium 130 (*)     Hemoglobin 11.2 (*)     HCT 33.0 (*)     All other components within normal limits  PROTIME-INR  URINE MICROSCOPIC-ADD ON   Dg Chest 2 View  03/29/2012  *RADIOLOGY REPORT*  Clinical Data: History  of right lung cancer, status post chemotherapy and radiation,  weakness, cough  CHEST - 2 VIEW  Comparison: 03/23/2012  Findings: Right upper lobe hilar patchy airspace process noted which may be related to radiation pneumonitis/fibrosis however underlying pneumonia not excluded.  Compared to the prior chest CT from 03/23/2012, no significant interval change.  Left lung remains clear.  Normal heart size.  No effusion or pneumothorax.  IMPRESSION: Stable patchy right hilar airspace disease as described  Original Report Authenticated By: Judie Petit. Ruel Favors, M.D.     1. Weakness   2. Hyponatremia       MDM  hyponaturemia given IV fluids feels better will FU oncologist tomorrow        Arman Filter, NP 03/30/12 [phone]

## 2012-03-29 NOTE — ED Notes (Signed)
Pt reports worsening weakness and lethargy x1 month. Pt reports she has been in contact with her oncology MD and he does not believe this is related to her cancer. Patient reports no recent chemo treatments, but recent scans showing no cancer progression. Pt reports decreased appetite and intake as she also states food and drinks do not taste normal to her. Pt denies recent fever but states she was treated about a month ago for fever and unknown infection. Pt denies shortness of breath, but states she has a non-productive cough that is new. Pt denies abdominal pain or painful urination.

## 2012-03-30 NOTE — ED Provider Notes (Signed)
Medical screening examination/treatment/procedure(s) were performed by non-physician practitioner and as supervising physician I was immediately available for consultation/collaboration.   Duane Earnshaw, MD 03/30/12 1455 

## 2012-03-31 ENCOUNTER — Telehealth: Payer: Self-pay | Admitting: Medical Oncology

## 2012-03-31 ENCOUNTER — Telehealth: Payer: Self-pay | Admitting: Internal Medicine

## 2012-03-31 NOTE — Telephone Encounter (Signed)
Caller: Levan Hurst; PCP: [name]; CB#: [phone]; ; ; Call regarding Fatigue; seen in ED 03/29/12 and diagnosed with hyponatremia; Na 130.  Given fluids and sent home.  BP 68/48 at the time.  BP now 100/68.  Stopped the lisinopril, and wants to know when she should restart the lisinopril.  Concerned that the celexa is causing the drop in sodium.  States continues to be very fatigued and quiet, but has "perked up" since coming back home.  Wants instructions regarding lisinopril.  Per protocol, emergent symptoms denied; advised appointment within 24 hours.  Appointment scheduled 04/01/12 1315 with Dr. Laury Axon.

## 2012-03-31 NOTE — Telephone Encounter (Signed)
I instructed sharon to call Dr Windle Guard re celexa and low sodium

## 2012-03-31 NOTE — Telephone Encounter (Signed)
PT  Left vague message that she has low sodium and it is related to celexa and BP med. I called her back and left message to call the provider who ordered those meds. I also told her that her sodium was normal injuly

## 2012-04-01 ENCOUNTER — Ambulatory Visit (INDEPENDENT_AMBULATORY_CARE_PROVIDER_SITE_OTHER): Payer: BC Managed Care – PPO | Admitting: Family Medicine

## 2012-04-01 ENCOUNTER — Encounter: Payer: Self-pay | Admitting: Family Medicine

## 2012-04-01 VITALS — BP 122/60 | HR 87 | Temp 98.3°F | Wt 128.4 lb

## 2012-04-01 DIAGNOSIS — M791 Myalgia, unspecified site: Secondary | ICD-10-CM

## 2012-04-01 DIAGNOSIS — R5381 Other malaise: Secondary | ICD-10-CM

## 2012-04-01 DIAGNOSIS — F341 Dysthymic disorder: Secondary | ICD-10-CM

## 2012-04-01 DIAGNOSIS — F32A Depression, unspecified: Secondary | ICD-10-CM

## 2012-04-01 DIAGNOSIS — F418 Other specified anxiety disorders: Secondary | ICD-10-CM

## 2012-04-01 DIAGNOSIS — R5383 Other fatigue: Secondary | ICD-10-CM

## 2012-04-01 DIAGNOSIS — F329 Major depressive disorder, single episode, unspecified: Secondary | ICD-10-CM

## 2012-04-01 DIAGNOSIS — R197 Diarrhea, unspecified: Secondary | ICD-10-CM

## 2012-04-01 DIAGNOSIS — IMO0001 Reserved for inherently not codable concepts without codable children: Secondary | ICD-10-CM

## 2012-04-01 DIAGNOSIS — K589 Irritable bowel syndrome without diarrhea: Secondary | ICD-10-CM

## 2012-04-01 LAB — POCT URINALYSIS DIPSTICK
Ketones, UA: NEGATIVE
Leukocytes, UA: NEGATIVE
Protein, UA: NEGATIVE
Urobilinogen, UA: 0.2
pH, UA: 6

## 2012-04-01 MED ORDER — HYOSCYAMINE SULFATE ER 0.375 MG PO TB12: 0.3750 mg | ORAL_TABLET | Freq: Two times a day (BID) | ORAL | Status: DC | PRN

## 2012-04-01 MED ORDER — CITALOPRAM HYDROBROMIDE 40 MG PO TABS: 40.0000 mg | ORAL_TABLET | Freq: Every day | ORAL | Status: DC

## 2012-04-01 NOTE — Patient Instructions (Signed)

## 2012-04-01 NOTE — Progress Notes (Signed)
  Subjective:    Patient ID: [name] [name], female    DOB: 1950-12-31, 61 y.o.   MRN: [id number]  HPI Pt here with her sister c/o dyspepsia, crying a lot.  She went to ER because she was so tired and her Na was low.  Pt just feels aweful.  She felt better when she had chemo.   No other complaints.   Review of Systems As above    Objective:   Physical Exam  Constitutional: She is oriented to person, place, and time. She appears well-developed and well-nourished.  Abdominal: Soft. Bowel sounds are normal. She exhibits no distension. There is no tenderness. There is no rebound and no guarding.  Neurological: She is alert and oriented to person, place, and time.  Psychiatric: She has a normal mood and affect. Her behavior is normal.       Teary eyed Not suicidal/ homocidal          Assessment & Plan:

## 2012-04-01 NOTE — Assessment & Plan Note (Signed)
Inc celexa 40 mg  con't xanax

## 2012-04-01 NOTE — Assessment & Plan Note (Signed)
Hx IBS Refer to GI

## 2012-04-01 NOTE — Telephone Encounter (Signed)
FYI

## 2012-04-02 ENCOUNTER — Encounter: Payer: Self-pay | Admitting: Internal Medicine

## 2012-04-02 ENCOUNTER — Ambulatory Visit: Payer: BC Managed Care – PPO | Admitting: Internal Medicine

## 2012-04-02 LAB — CBC WITH DIFFERENTIAL/PLATELET
Basophils Absolute: 0 10*3/uL (ref 0.0–0.1)
Basophils Relative: 0.2 % (ref 0.0–3.0)
Eosinophils Absolute: 0.1 10*3/uL (ref 0.0–0.7)
HCT: 34.2 % — ABNORMAL LOW (ref 36.0–46.0)
Hemoglobin: 11.2 g/dL — ABNORMAL LOW (ref 12.0–15.0)
Lymphocytes Relative: 9.9 % — ABNORMAL LOW (ref 12.0–46.0)
Lymphs Abs: 0.6 10*3/uL — ABNORMAL LOW (ref 0.7–4.0)
MCHC: 32.9 g/dL (ref 30.0–36.0)
MCV: 93.4 fl (ref 78.0–100.0)
Monocytes Absolute: 0.3 10*3/uL (ref 0.1–1.0)
Neutro Abs: 4.6 10*3/uL (ref 1.4–7.7)
RBC: 3.66 Mil/uL — ABNORMAL LOW (ref 3.87–5.11)
RDW: 12.1 % (ref 11.5–14.6)

## 2012-04-02 LAB — BASIC METABOLIC PANEL
CO2: 25 mEq/L (ref 19–32)
Calcium: 9.8 mg/dL (ref 8.4–10.5)
Chloride: 100 mEq/L (ref 96–112)
Glucose, Bld: 75 mg/dL (ref 70–99)
Potassium: 4.2 mEq/L (ref 3.5–5.1)
Sodium: 134 mEq/L — ABNORMAL LOW (ref 135–145)

## 2012-04-02 LAB — HEPATIC FUNCTION PANEL
Albumin: 4 g/dL (ref 3.5–5.2)
Alkaline Phosphatase: 50 U/L (ref 39–117)
Total Protein: 7 g/dL (ref 6.0–8.3)

## 2012-04-02 LAB — TSH: TSH: 1.58 u[IU]/mL (ref 0.35–5.50)

## 2012-04-03 ENCOUNTER — Other Ambulatory Visit: Payer: Self-pay | Admitting: Family Medicine

## 2012-04-03 DIAGNOSIS — E538 Deficiency of other specified B group vitamins: Secondary | ICD-10-CM

## 2012-04-06 ENCOUNTER — Telehealth: Payer: Self-pay | Admitting: *Deleted

## 2012-04-06 NOTE — Telephone Encounter (Signed)
Discuss with patient sister, appt scheduled for injection.

## 2012-04-06 NOTE — Telephone Encounter (Signed)
Na still low but improved-- celexa may be causing that. She may want to discuss changing this with Dr Drue Novel. She is slightly anemic---  Take MVI with iron daily Her b12 is low normal-- she may benefit from weekly injections x4 then monthly----recheck 1 month-----285.9 cbcd, b12

## 2012-04-07 ENCOUNTER — Ambulatory Visit (INDEPENDENT_AMBULATORY_CARE_PROVIDER_SITE_OTHER): Payer: BC Managed Care – PPO | Admitting: *Deleted

## 2012-04-07 DIAGNOSIS — E538 Deficiency of other specified B group vitamins: Secondary | ICD-10-CM

## 2012-04-07 MED ORDER — CYANOCOBALAMIN 1000 MCG/ML IJ SOLN
1000.0000 ug | Freq: Once | INTRAMUSCULAR | Status: AC
  Administered 2012-04-07: 1000 ug via INTRAMUSCULAR

## 2012-04-14 ENCOUNTER — Ambulatory Visit (INDEPENDENT_AMBULATORY_CARE_PROVIDER_SITE_OTHER): Payer: BC Managed Care – PPO | Admitting: *Deleted

## 2012-04-14 DIAGNOSIS — E538 Deficiency of other specified B group vitamins: Secondary | ICD-10-CM

## 2012-04-14 MED ORDER — CYANOCOBALAMIN 1000 MCG/ML IJ SOLN
1000.0000 ug | Freq: Once | INTRAMUSCULAR | Status: AC
  Administered 2012-04-14: 1000 ug via INTRAMUSCULAR

## 2012-04-21 ENCOUNTER — Ambulatory Visit (INDEPENDENT_AMBULATORY_CARE_PROVIDER_SITE_OTHER): Payer: BC Managed Care – PPO | Admitting: *Deleted

## 2012-04-21 DIAGNOSIS — E538 Deficiency of other specified B group vitamins: Secondary | ICD-10-CM

## 2012-04-21 MED ORDER — CYANOCOBALAMIN 1000 MCG/ML IJ SOLN
1000.0000 ug | Freq: Once | INTRAMUSCULAR | Status: AC
  Administered 2012-04-21: 1000 ug via INTRAMUSCULAR

## 2012-04-28 ENCOUNTER — Ambulatory Visit (INDEPENDENT_AMBULATORY_CARE_PROVIDER_SITE_OTHER): Payer: BC Managed Care – PPO | Admitting: *Deleted

## 2012-04-28 DIAGNOSIS — D519 Vitamin B12 deficiency anemia, unspecified: Secondary | ICD-10-CM

## 2012-04-28 DIAGNOSIS — D518 Other vitamin B12 deficiency anemias: Secondary | ICD-10-CM

## 2012-04-28 MED ORDER — CYANOCOBALAMIN 1000 MCG/ML IJ SOLN
1000.0000 ug | Freq: Once | INTRAMUSCULAR | Status: AC
  Administered 2012-04-28: 1000 ug via INTRAMUSCULAR

## 2012-05-05 ENCOUNTER — Encounter: Payer: Self-pay | Admitting: Internal Medicine

## 2012-05-05 ENCOUNTER — Ambulatory Visit (INDEPENDENT_AMBULATORY_CARE_PROVIDER_SITE_OTHER): Payer: BC Managed Care – PPO | Admitting: Internal Medicine

## 2012-05-05 VITALS — BP 110/78 | HR 88 | Ht 63.5 in | Wt 131.4 lb

## 2012-05-05 DIAGNOSIS — K589 Irritable bowel syndrome without diarrhea: Secondary | ICD-10-CM

## 2012-05-05 DIAGNOSIS — Z86010 Personal history of colonic polyps: Secondary | ICD-10-CM

## 2012-05-05 NOTE — Patient Instructions (Addendum)
We have put in a recall colonoscopy for one year.  You will be contacted around that time.

## 2012-05-05 NOTE — Progress Notes (Signed)
HISTORY OF PRESENT ILLNESS:  [name] [name] is a 61 y.o. female with hypertension, dyslipidemia, anxiety, and small cell carcinoma of the lung diagnosed earlier this year. Patient also has a history of irritable bowel syndrome, symptomatic cholelithiasis with prior ERCP and sphincterotomy (negative for stones) by Dr. Evette Cristal (September 2012) followed by cholecystectomy, and adenomatous colon polyp. She presents today regarding the need for surveillance colonoscopy. We requested, receive, and subsequently reviewed her previous colonoscopy. This was performed at [facility] 04/30/2006. She was said to have a complete examination to the cecum with good preparation. A 6-7 millimeter cecal polyp was removed with cold snare and found to be a tubular adenoma. The remainder of the colonoscopy was said to be normal. She spoke with her primary provider last year regarding surveillance colonoscopy. This was recommended. However, she was having some pulmonary symptoms for which she underwent chest x-ray was subsequently found to have inoperable right-sided lung cancer. This was found to be small cell type. She was diagnosed with January 2013. She finished chemotherapy and radiation therapy around May 2013. She reports having undergone prophylactic brain radiation as well. She continues to recover. She reports chronic irritable bowel as manifested by postprandial urgency with loose stools generally once on up to 3 times most days. No nocturnal symptoms. She denies bleeding or other issues. Again, she states her symptoms have been stable for decades.She is not keen for colonoscopy at this time.  REVIEW OF SYSTEMS:  All non-GI ROS negative except for depression  Past Medical History  Diagnosis Date  . Hypertension   . High cholesterol   . Anxiety   . Osteoporosis   . Lung mass 09/19/11    small cell carcinoma  . Hx of radiation therapy 10/07/11 to 11/20/11    right lung  . Hx of radiation therapy  01/07/2012- 01/21/12    cranial irradiation  . Lung cancer 09/19/11    SMALL CELL CARCINOMA    Past Surgical History  Procedure Date  . Tubal ligation   . Cholecystectomy 04/2011    biliary stent placement    Social History [name] [name]  reports that she quit smoking about 7 months ago. Her smoking use included Cigarettes. She does not have any smokeless tobacco history on file. She reports that she drinks alcohol. She reports that she does not use illicit drugs.  family history includes Atrial fibrillation in her mother; Cancer in her father; Clotting disorder in her mother; Colon polyps in her father, mother, and sister; Heart disease in her mother; and Stroke in her father.  Allergies  Allergen Reactions  . Codeine Nausea And Vomiting  . Penicillins Rash       PHYSICAL EXAMINATION: Vital signs: BP 110/78  Pulse 88  Ht 5' 3.5" (1.613 m)  Wt 131 lb 6 oz (59.591 kg)  BMI 22.91 kg/m2  Constitutional: generally well-appearing, no acute distress. Iatrogenic alopecia Psychiatric: alert and oriented x3, cooperative Eyes: extraocular movements intact, anicteric, conjunctiva pink Mouth: oral pharynx moist, no lesions Neck: supple no lymphadenopathy Cardiovascular: heart regular rate and rhythm, no murmur Lungs: clear to auscultation bilaterally Abdomen: soft, nontender, nondistended, no obvious ascites, no peritoneal signs, normal bowel sounds, no organomegaly Rectal:omitted Extremities: no lower extremity edema bilaterally Skin: no lesions on visible extremities Neuro: No focal deficits.   ASSESSMENT:  #1. Stage III small cell carcinoma of the lung diagnosed January 2013. Status post chemoradiation therapy. Continues to be followed by oncology with future restaging/assessment of therapy anticipated #2. History of  adenomatous colon polyp September 2007 #3. Diarrhea predominant irritable bowel syndrome. Stable   PLAN:  #1. Given her current active health issues,  it would be most appropriate to reassess her for surveillance colonoscopy in 1 year. She is having no new active GI issues and her previous colonoscopy in 2007 as described non-advanced polyp. She is in full support of this approach. As such, we will put in for a computer-generated colonoscopy recall for one year. If she is health appropriate, at that time, we will proceed. In the interim, if she has any new GI issues that need our attention, I would be delighted to see her. Terms of her irritable bowel, ongoing symptomatic therapies.

## 2012-05-26 ENCOUNTER — Telehealth: Payer: Self-pay | Admitting: Medical Oncology

## 2012-05-26 NOTE — Telephone Encounter (Signed)
Asking if she can have flu vaccine-I told her it is okay to take flu vaccine and to contact PCP for vaccine

## 2012-06-21 ENCOUNTER — Ambulatory Visit (HOSPITAL_COMMUNITY)
Admission: RE | Admit: 2012-06-21 | Discharge: 2012-06-21 | Disposition: A | Discharge status: Home / Self Care | Payer: BC Managed Care – PPO | Source: Ambulatory Visit | Attending: Internal Medicine | Admitting: Internal Medicine

## 2012-06-21 ENCOUNTER — Other Ambulatory Visit (HOSPITAL_BASED_OUTPATIENT_CLINIC_OR_DEPARTMENT_OTHER): Payer: BC Managed Care – PPO | Admitting: Lab

## 2012-06-21 ENCOUNTER — Encounter (HOSPITAL_COMMUNITY): Payer: Self-pay

## 2012-06-21 DIAGNOSIS — Z923 Personal history of irradiation: Secondary | ICD-10-CM | POA: Insufficient documentation

## 2012-06-21 DIAGNOSIS — C3490 Malignant neoplasm of unspecified part of unspecified bronchus or lung: Secondary | ICD-10-CM | POA: Insufficient documentation

## 2012-06-21 DIAGNOSIS — I2510 Atherosclerotic heart disease of native coronary artery without angina pectoris: Secondary | ICD-10-CM | POA: Insufficient documentation

## 2012-06-21 DIAGNOSIS — Z9221 Personal history of antineoplastic chemotherapy: Secondary | ICD-10-CM | POA: Insufficient documentation

## 2012-06-21 DIAGNOSIS — R0789 Other chest pain: Secondary | ICD-10-CM | POA: Insufficient documentation

## 2012-06-21 LAB — CBC WITH DIFFERENTIAL/PLATELET
Eosinophils Absolute: 0.1 10*3/uL (ref 0.0–0.5)
HCT: 33 % — ABNORMAL LOW (ref 34.8–46.6)
LYMPH%: 13.2 % — ABNORMAL LOW (ref 14.0–49.7)
MCV: 96 fL (ref 79.5–101.0)
MONO#: 0.5 10*3/uL (ref 0.1–0.9)
MONO%: 9.9 % (ref 0.0–14.0)
NEUT#: 3.6 10*3/uL (ref 1.5–6.5)
NEUT%: 74.9 % (ref 38.4–76.8)
Platelets: 193 10*3/uL (ref 145–400)
WBC: 4.8 10*3/uL (ref 3.9–10.3)

## 2012-06-21 LAB — COMPREHENSIVE METABOLIC PANEL (CC13)
BUN: 15 mg/dL (ref 7.0–26.0)
CO2: 26 mEq/L (ref 22–29)
Creatinine: 1 mg/dL (ref 0.6–1.1)
Glucose: 93 mg/dl (ref 70–99)
Total Bilirubin: 0.26 mg/dL (ref 0.20–1.20)

## 2012-06-21 MED ORDER — IOHEXOL 300 MG/ML  SOLN
80.0000 mL | Freq: Once | INTRAMUSCULAR | Status: AC | PRN
  Administered 2012-06-21: 80 mL via INTRAVENOUS

## 2012-06-23 ENCOUNTER — Ambulatory Visit (HOSPITAL_BASED_OUTPATIENT_CLINIC_OR_DEPARTMENT_OTHER): Payer: BC Managed Care – PPO | Admitting: Internal Medicine

## 2012-06-23 ENCOUNTER — Telehealth: Payer: Self-pay | Admitting: Internal Medicine

## 2012-06-23 VITALS — BP 133/71 | HR 101 | Temp 98.9°F | Resp 20 | Ht 63.5 in | Wt 138.8 lb

## 2012-06-23 DIAGNOSIS — C3410 Malignant neoplasm of upper lobe, unspecified bronchus or lung: Secondary | ICD-10-CM

## 2012-06-23 DIAGNOSIS — M810 Age-related osteoporosis without current pathological fracture: Secondary | ICD-10-CM

## 2012-06-23 DIAGNOSIS — C3490 Malignant neoplasm of unspecified part of unspecified bronchus or lung: Secondary | ICD-10-CM

## 2012-06-23 NOTE — Telephone Encounter (Signed)
Gave pt appt for January 2014 lab and Ct then MD visit , NPO 4 hours prior to CT

## 2012-06-23 NOTE — Progress Notes (Signed)
[facility] [phone]   [phone]  OFFICE PROGRESS NOTE  [name] Ora, MD [phone] [address] [facility] [address] [place] [address]  DIAGNOSIS: Limited stage small cell lung cancer   PRIOR THERAPY:  1) Systemic chemotherapy with cisplatin at 60 mg per meter square given on day 1 and etoposide at 120 mg per meter square given on days one 2 and 3 with Neulasta support given on day 4 status post 4 cycles, last cycle was given on 12/02/2011 . This with concurrent with radiotherapy under the care of Dr. Mitzi Hansen.  2) prophylactic cranial irradiation under the care of Dr. Mitzi Hansen completed on 01/21/2012   CURRENT THERAPY: None.  INTERVAL HISTORY: [name] [name] 61 y.o. female returns to the clinic today for routine three-month followup visit. The patient is feeling fine today with no specific complaints except for dry cough most likely secondary to radiation-induced pneumonitis. The patient denied having any significant shortness of breath, chest pain or hemoptysis. She denied having any significant weight loss or night sweats. She has repeat CT scan of the chest performed recently and she is here today for evaluation and discussion of her scan results.  MEDICAL HISTORY: Past Medical History  Diagnosis Date  . Hypertension   . High cholesterol   . Anxiety   . Osteoporosis   . Lung mass 09/19/11    small cell carcinoma  . Hx of radiation therapy 10/07/11 to 11/20/11    right lung  . Hx of radiation therapy 01/07/2012- 01/21/12    cranial irradiation  . Lung cancer 09/19/11    SMALL CELL CARCINOMA    ALLERGIES:  is allergic to codeine and penicillins.  MEDICATIONS:  Current Outpatient Prescriptions  Medication Sig Dispense Refill  . ALPRAZolam (XANAX) 0.5 MG tablet Take 0.25 mg by mouth 3 (three) times daily as needed. For anxiety.      Marland Kitchen aspirin 81 MG tablet Take 81 mg by mouth daily.       . citalopram (CELEXA) 40 MG tablet Take 1  tablet (40 mg total) by mouth daily.  30 tablet  3  . Multiple Minerals (MULTI-MINERALS) TABS Take by mouth.      . DISCONTD: sucralfate (CARAFATE) 1 G tablet Take 1 tablet (1 g total) by mouth 4 (four) times daily. Dissolve tablet in 15cc water.  120 tablet  1   No current facility-administered medications for this visit.   Facility-Administered Medications Ordered in Other Visits  Medication Dose Route Frequency Provider Last Rate Last Dose  . DISCONTD: topical emolient (BIAFINE) emulsion   Topical PRN Jonna Coup, MD      . DISCONTD: topical emolient (BIAFINE) emulsion   Topical PRN Jonna Coup, MD      . DISCONTD: topical emolient (BIAFINE) emulsion   Topical Daily Jonna Coup, MD        SURGICAL HISTORY:  Past Surgical History  Procedure Date  . Tubal ligation   . Cholecystectomy 04/2011    biliary stent placement    REVIEW OF SYSTEMS:  A comprehensive review of systems was negative except for: Respiratory: positive for cough   PHYSICAL EXAMINATION: General appearance: alert, cooperative and no distress Head: Normocephalic, without obvious abnormality, atraumatic Neck: no adenopathy Lymph nodes: Cervical, supraclavicular, and axillary nodes normal. Resp: clear to auscultation bilaterally Cardio: regular rate and rhythm, S1, S2 normal, no murmur, click, rub or gallop GI: soft, non-tender; bowel sounds normal; no masses,  no  organomegaly Extremities: extremities normal, atraumatic, no cyanosis or edema  ECOG PERFORMANCE STATUS: 1 - Symptomatic but completely ambulatory  Blood pressure 133/71, pulse 101, temperature 98.9 F (37.2 C), temperature source Oral, resp. rate 20, height 5' 3.5" (1.613 m), weight 138 lb 12.8 oz (62.959 kg).  LABORATORY DATA: Lab Results  Component Value Date   WBC 4.8 06/21/2012   HGB 11.6 06/21/2012   HCT 33.0* 06/21/2012   MCV 96.0 06/21/2012   PLT 193 06/21/2012      Chemistry      Component Value Date/Time   NA 139  06/21/2012 1353   NA 134* 04/01/2012 1417   NA 133 03/23/2012 1305   K 3.7 06/21/2012 1353   K 4.2 04/01/2012 1417   K 4.4 03/23/2012 1305   CL 104 06/21/2012 1353   CL 100 04/01/2012 1417   CL 93* 03/23/2012 1305   CO2 26 06/21/2012 1353   CO2 25 04/01/2012 1417   CO2 25 03/23/2012 1305   BUN 15.0 06/21/2012 1353   BUN 9 04/01/2012 1417   BUN 10 03/23/2012 1305   CREATININE 1.0 06/21/2012 1353   CREATININE 0.7 04/01/2012 1417   CREATININE 1.0 03/23/2012 1305      Component Value Date/Time   CALCIUM 10.1 06/21/2012 1353   CALCIUM 9.8 04/01/2012 1417   CALCIUM 9.4 03/23/2012 1305   ALKPHOS 43 06/21/2012 1353   ALKPHOS 50 04/01/2012 1417   ALKPHOS 48 03/23/2012 1305   AST 17 06/21/2012 1353   AST 18 04/01/2012 1417   AST 18 03/23/2012 1305   ALT 13 06/21/2012 1353   ALT 11 04/01/2012 1417   BILITOT 0.26 06/21/2012 1353   BILITOT 0.4 04/01/2012 1417   BILITOT 0.60 03/23/2012 1305       RADIOGRAPHIC STUDIES: Ct Chest W Contrast  06/21/2012  *RADIOLOGY REPORT*  Clinical Data: Lung cancer diagnosed 01/13.  Chemotherapy and radiation therapy complete.  Radiation therapy to brain completed 6/13.  Chest pressure.  CT CHEST WITH CONTRAST  Technique:  Multidetector CT imaging of the chest was performed following the standard protocol during bolus administration of intravenous contrast.  Contrast: 80mL OMNIPAQUE IOHEXOL 300 MG/ML  SOLN  Comparison: plain film of 03/29/2012.  CT of 03/23/2012 and 12/23/2011.  Findings: Lung windows demonstrate clear left lung.  Geographic distribution of inferior right upper lobe, posterior right upper lobe, and superior segment right lower lobe pulmonary opacity with minimal bronchiectasis.  Decrease in the more caudal ground-glass opacity and septal thickening since the prior.  No well-defined mass to suggest recurrent or residual disease.  Soft tissue windows demonstrate no supraclavicular adenopathy. Normal heart size without pericardial or pleural effusion.  LAD coronary artery  atherosclerosis. No central pulmonary embolism, on this non-dedicated study.  No mediastinal or hilar adenopathy.  Limited abdominal imaging demonstrates possible mild hepatic steatosis.  Normal imaged portions of adrenal glands. No acute osseous abnormality.  IMPRESSION:  1.  Evolving right-sided pulmonary opacities, favored to be radiation induced . 2.  No evidence of recurrent or metastatic disease. 3. Age advanced coronary artery atherosclerosis.  Recommend assessment of coronary risk factors and consideration of medical therapy.   Original Report Authenticated By: Consuello Bossier, M.D.     ASSESSMENT: This is a very pleasant 61 years old white female with limited stage small cell lung cancer status post systemic chemotherapy concurrent with radiation followed by prophylactic irradiation and has been observation since may of 2013 was no evidence for disease recurrence.   PLAN: I discussed the  scan results with the patient. I recommended for her to continue on observation for now with repeat CT scan of the chest in 3 months. I may consider her for treatment with steroids if her radiation induced pneumonitis get worse. She was advised to call me immediately if she has any concerning symptoms in the interval. All questions were answered. The patient knows to call the clinic with any problems, questions or concerns. We can certainly see the patient much sooner if necessary.

## 2012-06-23 NOTE — Patient Instructions (Signed)
Your CT scan showed no evidence for disease recurrence. I would see her back for followup visit in 3 months with repeat CT scan of the chest

## 2012-07-29 ENCOUNTER — Other Ambulatory Visit: Payer: Self-pay | Admitting: Family Medicine

## 2012-09-22 ENCOUNTER — Ambulatory Visit (HOSPITAL_COMMUNITY)
Admission: RE | Admit: 2012-09-22 | Discharge: 2012-09-22 | Disposition: A | Discharge status: Home / Self Care | Payer: BC Managed Care – PPO | Source: Ambulatory Visit | Attending: Internal Medicine | Admitting: Internal Medicine

## 2012-09-22 ENCOUNTER — Other Ambulatory Visit (HOSPITAL_BASED_OUTPATIENT_CLINIC_OR_DEPARTMENT_OTHER): Payer: BC Managed Care – PPO | Admitting: Lab

## 2012-09-22 DIAGNOSIS — Z923 Personal history of irradiation: Secondary | ICD-10-CM | POA: Insufficient documentation

## 2012-09-22 DIAGNOSIS — C3410 Malignant neoplasm of upper lobe, unspecified bronchus or lung: Secondary | ICD-10-CM

## 2012-09-22 DIAGNOSIS — C3490 Malignant neoplasm of unspecified part of unspecified bronchus or lung: Secondary | ICD-10-CM | POA: Insufficient documentation

## 2012-09-22 LAB — CBC WITH DIFFERENTIAL/PLATELET
Eosinophils Absolute: 0.1 10*3/uL (ref 0.0–0.5)
MCV: 93.3 fL (ref 79.5–101.0)
MONO%: 8.8 % (ref 0.0–14.0)
NEUT#: 3.2 10*3/uL (ref 1.5–6.5)
RBC: 3.83 10*6/uL (ref 3.70–5.45)
RDW: 12.7 % (ref 11.2–14.5)
WBC: 4.3 10*3/uL (ref 3.9–10.3)

## 2012-09-22 LAB — COMPREHENSIVE METABOLIC PANEL (CC13)
Albumin: 3.8 g/dL (ref 3.5–5.0)
Alkaline Phosphatase: 51 U/L (ref 40–150)
CO2: 24 mEq/L (ref 22–29)
Calcium: 9.6 mg/dL (ref 8.4–10.4)
Chloride: 106 mEq/L (ref 98–107)
Glucose: 96 mg/dl (ref 70–99)
Potassium: 4.3 mEq/L (ref 3.5–5.1)
Sodium: 139 mEq/L (ref 136–145)
Total Protein: 7 g/dL (ref 6.4–8.3)

## 2012-09-22 MED ORDER — IOHEXOL 300 MG/ML  SOLN
80.0000 mL | Freq: Once | INTRAMUSCULAR | Status: AC | PRN
  Administered 2012-09-22: 80 mL via INTRAVENOUS

## 2012-09-23 ENCOUNTER — Ambulatory Visit (HOSPITAL_BASED_OUTPATIENT_CLINIC_OR_DEPARTMENT_OTHER): Payer: BC Managed Care – PPO | Admitting: Internal Medicine

## 2012-09-23 ENCOUNTER — Telehealth: Payer: Self-pay | Admitting: Internal Medicine

## 2012-09-23 ENCOUNTER — Encounter: Payer: Self-pay | Admitting: *Deleted

## 2012-09-23 VITALS — BP 146/87 | HR 92 | Temp 97.6°F | Resp 20 | Ht 63.5 in | Wt 143.0 lb

## 2012-09-23 DIAGNOSIS — C3490 Malignant neoplasm of unspecified part of unspecified bronchus or lung: Secondary | ICD-10-CM

## 2012-09-23 DIAGNOSIS — C3410 Malignant neoplasm of upper lobe, unspecified bronchus or lung: Secondary | ICD-10-CM

## 2012-09-23 NOTE — Progress Notes (Signed)
[facility] [phone]   Fax:([phone]  OFFICE PROGRESS NOTE  [name], MD [phone] [address] [facility] [address] [place] [address]  DIAGNOSIS: Limited stage small cell lung cancer   PRIOR THERAPY:  1) Systemic chemotherapy with cisplatin at 60 mg per meter square given on day 1 and etoposide at 120 mg per meter square given on days one 2 and 3 with Neulasta support given on day 4 status post 4 cycles, last cycle was given on 12/02/2011 . This with concurrent with radiotherapy under the care of Dr. Mitzi Hansen.  2) prophylactic cranial irradiation under the care of Dr. Mitzi Hansen completed on 01/21/2012   CURRENT THERAPY: None.  INTERVAL HISTORY: [name] [name] 62 y.o. female returns to the clinic today for three-month followup visit. The patient is feeling fine today with no specific complaints. She denied having any significant chest pain, shortness breath, cough or hemoptysis. She denied having any significant weight loss or night sweats. The patient had repeat CT scan of the chest performed recently and she is here for evaluation and discussion of her scan results.   MEDICAL HISTORY: Past Medical History  Diagnosis Date  . Hypertension   . High cholesterol   . Anxiety   . Osteoporosis   . Lung mass 09/19/11    small cell carcinoma  . Hx of radiation therapy 10/07/11 to 11/20/11    right lung  . Hx of radiation therapy 01/07/2012- 01/21/12    cranial irradiation  . Lung cancer 09/19/11    SMALL CELL CARCINOMA    ALLERGIES:  is allergic to codeine and penicillins.  MEDICATIONS:  Current Outpatient Prescriptions  Medication Sig Dispense Refill  . ALPRAZolam (XANAX) 0.5 MG tablet Take 0.25 mg by mouth 3 (three) times daily as needed. For anxiety.      Marland Kitchen aspirin 81 MG tablet Take 81 mg by mouth daily.       . citalopram (CELEXA) 40 MG tablet TAKE 1 TABLET BY MOUTH DAILY  30 tablet  2  . Multiple Minerals (MULTI-MINERALS) TABS Take by  mouth.      . [DISCONTINUED] sucralfate (CARAFATE) 1 G tablet Take 1 tablet (1 g total) by mouth 4 (four) times daily. Dissolve tablet in 15cc water.  120 tablet  1    SURGICAL HISTORY:  Past Surgical History  Procedure Date  . Tubal ligation   . Cholecystectomy 04/2011    biliary stent placement    REVIEW OF SYSTEMS:  A comprehensive review of systems was negative.   PHYSICAL EXAMINATION: General appearance: alert, cooperative and no distress Head: Normocephalic, without obvious abnormality, atraumatic Neck: no adenopathy Lymph nodes: Cervical, supraclavicular, and axillary nodes normal. Resp: clear to auscultation bilaterally Cardio: regular rate and rhythm, S1, S2 normal, no murmur, click, rub or gallop GI: soft, non-tender; bowel sounds normal; no masses,  no organomegaly Extremities: extremities normal, atraumatic, no cyanosis or edema  ECOG PERFORMANCE STATUS: 0 - Asymptomatic  Blood pressure 146/87, pulse 92, temperature 97.6 F (36.4 C), temperature source Oral, resp. rate 20, height 5' 3.5" (1.613 m), weight 143 lb (64.864 kg).  LABORATORY DATA: Lab Results  Component Value Date   WBC 4.3 09/22/2012   HGB 12.2 09/22/2012   HCT 35.7 09/22/2012   MCV 93.3 09/22/2012   PLT 211 09/22/2012      Chemistry      Component Value Date/Time   NA 139 09/22/2012 0956   NA 134* 04/01/2012 1417  NA 133 03/23/2012 1305   K 4.3 09/22/2012 0956   K 4.2 04/01/2012 1417   K 4.4 03/23/2012 1305   CL 106 09/22/2012 0956   CL 100 04/01/2012 1417   CL 93* 03/23/2012 1305   CO2 24 09/22/2012 0956   CO2 25 04/01/2012 1417   CO2 25 03/23/2012 1305   BUN 21.1 09/22/2012 0956   BUN 9 04/01/2012 1417   BUN 10 03/23/2012 1305   CREATININE 1.0 09/22/2012 0956   CREATININE 0.7 04/01/2012 1417   CREATININE 1.0 03/23/2012 1305      Component Value Date/Time   CALCIUM 9.6 09/22/2012 0956   CALCIUM 9.8 04/01/2012 1417   CALCIUM 9.4 03/23/2012 1305   ALKPHOS 51 09/22/2012 0956   ALKPHOS 50 04/01/2012 1417   ALKPHOS  48 03/23/2012 1305   AST 13 09/22/2012 0956   AST 18 04/01/2012 1417   AST 18 03/23/2012 1305   ALT 8 09/22/2012 0956   ALT 11 04/01/2012 1417   BILITOT 0.54 09/22/2012 0956   BILITOT 0.4 04/01/2012 1417   BILITOT 0.60 03/23/2012 1305       RADIOGRAPHIC STUDIES: Ct Chest W Contrast  09/22/2012  *RADIOLOGY REPORT*  Clinical Data: Lung cancer diagnosed 08/2011, chemotherapy and XRT complete  CT CHEST WITH CONTRAST  Technique:  Multidetector CT imaging of the chest was performed following the standard protocol during bolus administration of intravenous contrast.  Contrast: 80mL OMNIPAQUE IOHEXOL 300 MG/ML  SOLN  Comparison: 06/21/2012  Findings: Right paramediastinal radiation changes.  No suspicious pulmonary nodules.  No pleural effusion or pneumothorax.  Visualized thyroid is unremarkable.  The heart is normal in size.  No pericardial effusion.  Coronary atherosclerosis.  No suspicious mediastinal, hilar, or axillary lymphadenopathy.  Visualized upper abdomen is unremarkable.  Mild degenerative changes of the visualized thoracolumbar spine.  IMPRESSION: Right paramediastinal radiation changes.  No evidence of recurrent or metastatic disease in the chest.   Original Report Authenticated By: Charline Bills, M.D.     ASSESSMENT: This is a very pleasant 62 years old white female with limited stage small cell lung cancer status post systemic chemotherapy concurrent with radiation followed by prophylactic cranial irradiation and has been observation since May of 2013 with no evidence for disease recurrence.  PLAN: I discussed the scan results with the patient today. I recommended for her to continue on observation with repeat CT scan of the chest in 3 months. The patient was advised to call me immediately if she has any concerning symptoms in the interval.  All questions were answered. The patient knows to call the clinic with any problems, questions or concerns. We can certainly see the patient much sooner if  necessary.

## 2012-09-23 NOTE — Telephone Encounter (Signed)
apps made and printed for pt aom °

## 2012-09-23 NOTE — Progress Notes (Signed)
Spoke with pt at [facility] no questions or concerns at this time

## 2012-09-25 ENCOUNTER — Encounter: Payer: Self-pay | Admitting: Internal Medicine

## 2012-09-25 NOTE — Patient Instructions (Signed)
No evidence for disease recurrence on recent scan.  Followup in 3 months with repeat CT scan of the chest.

## 2012-10-20 ENCOUNTER — Telehealth: Payer: Self-pay | Admitting: Internal Medicine

## 2012-10-20 NOTE — Telephone Encounter (Signed)
Pt last seen by Dr. Marina Goodell 05/05/12, has IBS with history of diarrhea nearly every time she eats. Pt has been taking Imodium in the mornings but she has a cousin that has started taking Prevalite and her diarrhea has stopped. Pt wants to know if that his something that may help her. Dr. Marina Goodell please advise.

## 2012-10-20 NOTE — Telephone Encounter (Signed)
That is a binding resin, cholestyramine, some times given for bile salt induced diarrhea (not IBS). She is welcome to try (4grams bid prn) but don't take within 2 hours of other meds (might interfere)

## 2012-10-21 MED ORDER — CHOLESTYRAMINE LIGHT 4 G PO PACK: 4.0000 g | PACK | Freq: Two times a day (BID) | ORAL | Status: DC

## 2012-10-21 NOTE — Addendum Note (Signed)
Addended by: Selinda Michaels R on: 10/21/2012 08:32 AM   Modules accepted: Orders

## 2012-10-21 NOTE — Telephone Encounter (Signed)
Spoke with pt and she is aware. Pt would like to try the cholestyramine. Script sent to pharmacy.

## 2012-11-25 ENCOUNTER — Telehealth: Payer: Self-pay | Admitting: Family Medicine

## 2012-11-26 NOTE — Telephone Encounter (Signed)
Advise patient, I refilled her medication but needs office visit. Please arrange

## 2012-11-26 NOTE — Telephone Encounter (Signed)
Discussed with pt, scheduled appt 4.10.14 @ 100p.

## 2012-11-26 NOTE — Telephone Encounter (Signed)
Ok to refill? Pt has not been seen in over a year.  Last filled 12.5.13

## 2012-12-01 ENCOUNTER — Encounter: Payer: Self-pay | Admitting: Lab

## 2012-12-02 ENCOUNTER — Encounter: Payer: BC Managed Care – PPO | Admitting: Internal Medicine

## 2012-12-03 ENCOUNTER — Encounter: Payer: Self-pay | Admitting: Internal Medicine

## 2012-12-03 ENCOUNTER — Ambulatory Visit (INDEPENDENT_AMBULATORY_CARE_PROVIDER_SITE_OTHER): Payer: BC Managed Care – PPO | Admitting: Internal Medicine

## 2012-12-03 ENCOUNTER — Encounter: Payer: Self-pay | Admitting: Lab

## 2012-12-03 VITALS — BP 136/84 | HR 82 | Temp 98.2°F | Ht 64.5 in | Wt 144.0 lb

## 2012-12-03 DIAGNOSIS — R197 Diarrhea, unspecified: Secondary | ICD-10-CM

## 2012-12-03 DIAGNOSIS — J449 Chronic obstructive pulmonary disease, unspecified: Secondary | ICD-10-CM

## 2012-12-03 DIAGNOSIS — I10 Essential (primary) hypertension: Secondary | ICD-10-CM

## 2012-12-03 DIAGNOSIS — E785 Hyperlipidemia, unspecified: Secondary | ICD-10-CM

## 2012-12-03 DIAGNOSIS — F418 Other specified anxiety disorders: Secondary | ICD-10-CM

## 2012-12-03 DIAGNOSIS — M810 Age-related osteoporosis without current pathological fracture: Secondary | ICD-10-CM

## 2012-12-03 DIAGNOSIS — Z23 Encounter for immunization: Secondary | ICD-10-CM

## 2012-12-03 DIAGNOSIS — Z2911 Encounter for prophylactic immunotherapy for respiratory syncytial virus (RSV): Secondary | ICD-10-CM

## 2012-12-03 DIAGNOSIS — Z0000 Encounter for general adult medical examination without abnormal findings: Secondary | ICD-10-CM

## 2012-12-03 LAB — BASIC METABOLIC PANEL
BUN: 13 mg/dL (ref 6–23)
Chloride: 99 mEq/L (ref 96–112)
Glucose, Bld: 95 mg/dL (ref 70–99)
Potassium: 4 mEq/L (ref 3.5–5.1)

## 2012-12-03 LAB — LIPID PANEL
Cholesterol: 396 mg/dL — ABNORMAL HIGH (ref 0–200)
Total CHOL/HDL Ratio: 5
VLDL: 23.8 mg/dL (ref 0.0–40.0)

## 2012-12-03 MED ORDER — LISINOPRIL 20 MG PO TABS: 20.0000 mg | ORAL_TABLET | Freq: Every day | ORAL | Status: DC

## 2012-12-03 NOTE — Patient Instructions (Addendum)
Check the  blood pressure 2 or 3 times a week, be sure it is between 110/60 and 140/85. If it is consistently higher or lower, let me know Try a probiotic Align 1 a day Next visit 6-8 months

## 2012-12-03 NOTE — Assessment & Plan Note (Addendum)
Td shot 2008  zostavax pt quite interested---> provided  Today (last chemo 12-2011, no recent XRT) sees gyn: they take care of osteoporosis-[facility]-PAP  colonoscopy in 2007 @ [facility], 2 polyps , told to repeat in 5 years per patient , rec to d/w GI continue healthy life style ! on ASA Will get labs @ oncology but will check a FLP BMP AST ALT today

## 2012-12-03 NOTE — Assessment & Plan Note (Signed)
Back on lisinopril 20 mg  (?), RF, rec to monitor BPs Labs (will call if correct dose is 40 mg)

## 2012-12-03 NOTE — Assessment & Plan Note (Signed)
Per gyn, self d/c boniva after dx of lung ca

## 2012-12-03 NOTE — Assessment & Plan Note (Signed)
Mild COPD per last PFTs Quit tobacco already

## 2012-12-03 NOTE — Assessment & Plan Note (Signed)
Well controlled w/  Citalopram. Not needing xanax but will call for a RF if needed

## 2012-12-03 NOTE — Progress Notes (Signed)
  Subjective:    Patient ID: [name] [name], female    DOB: November 02, 1950, 62 y.o.   MRN: [id number]  HPI CPX  Past Medical History  Diagnosis Date  . Hypertension   . High cholesterol   . Anxiety   . Osteoporosis   . Lung mass 09/19/11    small cell carcinoma  . Hx of radiation therapy 10/07/11 to 11/20/11    right lung  . Hx of radiation therapy 01/07/2012- 01/21/12    cranial irradiation  . Lung cancer 09/19/11    SMALL CELL CARCINOMA   Past Surgical History  Procedure Laterality Date  . Tubal ligation    . Cholecystectomy  04/2011    biliary stent placement   History   Social History  . Marital Status: Divorced    Spouse Name: N/A    Number of Children: 2  . Years of Education: N/A   Occupational History  . retired    Social History Main Topics  . Smoking status: Former Smoker    Types: Cigarettes    Quit date: 09/08/2011  . Smokeless tobacco: Never Used  . Alcohol Use: Yes     Comment: occassional  . Drug Use: No  . Sexually Active: Not on file   Other Topics Concern  . Not on file   Social History Narrative   divorced, 2 kids, lives by herself   diet --  healthy       exercise-- very active          Family History  Problem Relation Age of Onset  . Cancer Father     bladder  . Stroke Father   . Atrial fibrillation Mother   . Heart disease Mother     CHF  . Clotting disorder Mother     warfarin  . Colon polyps Sister   . Colon polyps Father   . Colon polyps Mother      Review of Systems In general feeling well, was treated for lung cancer, having frequent checkups by oncology. Emotionally doing well on citalopram, not needing Xanax. Occasional dry cough and chest congestion. Denies shortness or breath. Continue with chronic diarrhea, no blood in the stools, nausea, vomiting, abdominal pain or weight loss. Diarrhea is watery. She remains very active and eat healthy. She quit tobacco.    Objective:   Physical Exam BP 136/84  Pulse 82   Temp(Src) 98.2 F (36.8 C) (Oral)  Ht 5' 4.5" (1.638 m)  Wt 144 lb (65.318 kg)  BMI 24.34 kg/m2  SpO2 99%  General -- alert, well-developed, no apparent distress  Neck --no thyromegaly , normal carotid pulse Lungs -- normal respiratory effort, no intercostal retractions, no accessory muscle use, and slightly decreased breath sounds, very few rhonchi. No wheezing.     Heart-- normal rate, regular rhythm, no murmur, and no gallop.   Abdomen--soft, non-tender, no distention, no masses, no HSM, no guarding, and no rigidity.   Extremities-- no pretibial edema bilaterally  Neurologic-- alert & oriented X3 and strength normal in all extremities. Psych-- Cognition and judgment appear intact. Alert and cooperative with normal attention span and concentration.  not anxious appearing and not depressed appearing.       Assessment & Plan:

## 2012-12-03 NOTE — Assessment & Plan Note (Signed)
Ongoing issue, will see GI . Recommend trial w/ align

## 2012-12-09 MED ORDER — ATORVASTATIN CALCIUM 80 MG PO TABS: 80.0000 mg | ORAL_TABLET | Freq: Every day | ORAL | Status: DC

## 2012-12-09 NOTE — Addendum Note (Signed)
Addended by: Edwena Felty T on: 12/09/2012 12:35 PM   Modules accepted: Orders

## 2012-12-09 NOTE — Addendum Note (Signed)
Addended by: Edwena Felty T on: 12/09/2012 12:34 PM   Modules accepted: Orders

## 2012-12-23 ENCOUNTER — Other Ambulatory Visit: Payer: Self-pay | Admitting: Internal Medicine

## 2012-12-23 ENCOUNTER — Other Ambulatory Visit (HOSPITAL_BASED_OUTPATIENT_CLINIC_OR_DEPARTMENT_OTHER): Payer: BC Managed Care – PPO | Admitting: Lab

## 2012-12-23 ENCOUNTER — Encounter (HOSPITAL_COMMUNITY): Payer: Self-pay

## 2012-12-23 ENCOUNTER — Ambulatory Visit (HOSPITAL_COMMUNITY)
Admission: RE | Admit: 2012-12-23 | Discharge: 2012-12-23 | Disposition: A | Discharge status: Home / Self Care | Payer: BC Managed Care – PPO | Source: Ambulatory Visit | Attending: Internal Medicine | Admitting: Internal Medicine

## 2012-12-23 DIAGNOSIS — Z923 Personal history of irradiation: Secondary | ICD-10-CM | POA: Insufficient documentation

## 2012-12-23 DIAGNOSIS — C3490 Malignant neoplasm of unspecified part of unspecified bronchus or lung: Secondary | ICD-10-CM

## 2012-12-23 DIAGNOSIS — I2510 Atherosclerotic heart disease of native coronary artery without angina pectoris: Secondary | ICD-10-CM | POA: Insufficient documentation

## 2012-12-23 DIAGNOSIS — J479 Bronchiectasis, uncomplicated: Secondary | ICD-10-CM | POA: Insufficient documentation

## 2012-12-23 DIAGNOSIS — I700 Atherosclerosis of aorta: Secondary | ICD-10-CM | POA: Insufficient documentation

## 2012-12-23 DIAGNOSIS — Z9221 Personal history of antineoplastic chemotherapy: Secondary | ICD-10-CM | POA: Insufficient documentation

## 2012-12-23 LAB — BASIC METABOLIC PANEL (CC13)
CO2: 25 mEq/L (ref 22–29)
Calcium: 9.3 mg/dL (ref 8.4–10.4)
Chloride: 104 mEq/L (ref 98–107)
Glucose: 85 mg/dl (ref 70–99)
Potassium: 4.1 mEq/L (ref 3.5–5.1)
Sodium: 139 mEq/L (ref 136–145)

## 2012-12-23 LAB — CBC WITH DIFFERENTIAL/PLATELET
BASO%: 0.9 % (ref 0.0–2.0)
Eosinophils Absolute: 0.1 10*3/uL (ref 0.0–0.5)
LYMPH%: 15.2 % (ref 14.0–49.7)
MCHC: 34 g/dL (ref 31.5–36.0)
MONO#: 0.4 10*3/uL (ref 0.1–0.9)
NEUT#: 2.8 10*3/uL (ref 1.5–6.5)
RBC: 3.68 10*6/uL — ABNORMAL LOW (ref 3.70–5.45)
RDW: 13 % (ref 11.2–14.5)
WBC: 3.9 10*3/uL (ref 3.9–10.3)
lymph#: 0.6 10*3/uL — ABNORMAL LOW (ref 0.9–3.3)

## 2012-12-23 MED ORDER — IOHEXOL 300 MG/ML  SOLN
80.0000 mL | Freq: Once | INTRAMUSCULAR | Status: AC | PRN
  Administered 2012-12-23: 80 mL via INTRAVENOUS

## 2012-12-23 NOTE — Telephone Encounter (Signed)
Refill done.  

## 2012-12-27 ENCOUNTER — Ambulatory Visit (HOSPITAL_BASED_OUTPATIENT_CLINIC_OR_DEPARTMENT_OTHER): Payer: BC Managed Care – PPO | Admitting: Internal Medicine

## 2012-12-27 ENCOUNTER — Telehealth: Payer: Self-pay | Admitting: Internal Medicine

## 2012-12-27 ENCOUNTER — Encounter: Payer: Self-pay | Admitting: Internal Medicine

## 2012-12-27 VITALS — BP 124/75 | HR 95 | Temp 98.0°F | Resp 17 | Ht 63.0 in | Wt 148.4 lb

## 2012-12-27 DIAGNOSIS — C3410 Malignant neoplasm of upper lobe, unspecified bronchus or lung: Secondary | ICD-10-CM

## 2012-12-27 DIAGNOSIS — C3491 Malignant neoplasm of unspecified part of right bronchus or lung: Secondary | ICD-10-CM

## 2012-12-27 NOTE — Patient Instructions (Signed)
No evidence for disease recurrence on recent scan.  Followup visit in 4 months with repeat CT scan of the chest.

## 2012-12-27 NOTE — Progress Notes (Signed)
[facility] Telephone:(336) [phone]   Fax:(336) [phone]  OFFICE PROGRESS NOTE  [name], MD [phone] [facility] [address] [place] [address]  DIAGNOSIS: Limited stage small cell lung cancer diagnosed in January of 2013  PRIOR THERAPY:  1) Systemic chemotherapy with cisplatin at 60 mg per meter square given on day 1 and etoposide at 120 mg per meter square given on days one 2 and 3 with Neulasta support given on day 4 status post 4 cycles, last cycle was given on 12/02/2011 . This with concurrent with radiotherapy under the care of Dr. Mitzi Hansen.  2) prophylactic cranial irradiation under the care of Dr. Mitzi Hansen completed on 01/21/2012   CURRENT THERAPY: Observation.  INTERVAL HISTORY: [name] [name] 62 y.o. female returns to the clinic today for routine three-month followup visit. The patient is feeling fine today with no specific complaints. She denied having any significant chest pain but continues to have mild cough with no shortness breath or hemoptysis. The patient denied having any significant weight loss or night sweats. She has no nausea or vomiting. No neurological abnormalities. She had repeat CT scan of the chest performed recently and she is here for evaluation and discussion of her scan results.  MEDICAL HISTORY: Past Medical History  Diagnosis Date  . Hypertension   . High cholesterol   . Anxiety   . Osteoporosis   . Hx of radiation therapy 10/07/11 to 11/20/11    right lung  . Hx of radiation therapy 01/07/2012- 01/21/12    cranial irradiation  . Lung cancer 09/19/11    SMALL CELL CARCINOMA    ALLERGIES:  is allergic to codeine and penicillins.  MEDICATIONS:  Current Outpatient Prescriptions  Medication Sig Dispense Refill  . aspirin 81 MG tablet Take 81 mg by mouth daily.       Marland Kitchen atorvastatin (LIPITOR) 80 MG tablet Take 1 tablet (80 mg total) by mouth daily.  90 tablet  0  . citalopram (CELEXA) 40 MG tablet TAKE 1 TABLET BY  MOUTH DAILY  30 tablet  1  . lisinopril (PRINIVIL,ZESTRIL) 20 MG tablet Take 1 tablet (20 mg total) by mouth daily.  30 tablet  6  . Probiotic Product (ALIGN PO) Take by mouth daily.      . [DISCONTINUED] sucralfate (CARAFATE) 1 G tablet Take 1 tablet (1 g total) by mouth 4 (four) times daily. Dissolve tablet in 15cc water.  120 tablet  1   No current facility-administered medications for this visit.    SURGICAL HISTORY:  Past Surgical History  Procedure Laterality Date  . Tubal ligation    . Cholecystectomy  04/2011    biliary stent placement    REVIEW OF SYSTEMS:  A comprehensive review of systems was negative except for: Respiratory: positive for cough   PHYSICAL EXAMINATION: General appearance: alert, cooperative and no distress Head: Normocephalic, without obvious abnormality, atraumatic Neck: no adenopathy Lymph nodes: Cervical, supraclavicular, and axillary nodes normal. Resp: clear to auscultation bilaterally Cardio: regular rate and rhythm, S1, S2 normal, no murmur, click, rub or gallop GI: soft, non-tender; bowel sounds normal; no masses,  no organomegaly Extremities: extremities normal, atraumatic, no cyanosis or edema  ECOG PERFORMANCE STATUS: 1 - Symptomatic but completely ambulatory  Blood pressure 124/75, pulse 95, temperature 98 F (36.7 C), temperature source Oral, resp. rate 17, height 5\' 3"  (1.6 m), weight 148 lb 6.4 oz (67.314 kg).  LABORATORY DATA: Lab Results  Component Value Date  WBC 3.9 12/23/2012   HGB 11.7 12/23/2012   HCT 34.3* 12/23/2012   MCV 93.4 12/23/2012   PLT 206 12/23/2012      Chemistry      Component Value Date/Time   NA 139 12/23/2012 1338   NA 132* 12/03/2012 1138   NA 133 03/23/2012 1305   K 4.1 12/23/2012 1338   K 4.0 12/03/2012 1138   K 4.4 03/23/2012 1305   CL 104 12/23/2012 1338   CL 99 12/03/2012 1138   CL 93* 03/23/2012 1305   CO2 25 12/23/2012 1338   CO2 26 12/03/2012 1138   CO2 25 03/23/2012 1305   BUN 17.2 12/23/2012 1338   BUN 13  12/03/2012 1138   BUN 10 03/23/2012 1305   CREATININE 1.0 12/23/2012 1338   CREATININE 0.9 12/03/2012 1138   CREATININE 1.0 03/23/2012 1305      Component Value Date/Time   CALCIUM 9.3 12/23/2012 1338   CALCIUM 9.2 12/03/2012 1138   CALCIUM 9.4 03/23/2012 1305   ALKPHOS 51 09/22/2012 0956   ALKPHOS 50 04/01/2012 1417   ALKPHOS 48 03/23/2012 1305   AST 21 12/03/2012 1138   AST 13 09/22/2012 0956   AST 18 03/23/2012 1305   ALT 16 12/03/2012 1138   ALT 8 09/22/2012 0956   BILITOT 0.54 09/22/2012 0956   BILITOT 0.4 04/01/2012 1417   BILITOT 0.60 03/23/2012 1305       RADIOGRAPHIC STUDIES: Ct Chest W Contrast  12/23/2012  *RADIOLOGY REPORT*  Clinical Data: History of lung cancer status post chemotherapy and radiation therapy.  Restaging scan.  CT CHEST WITH CONTRAST  Technique:  Multidetector CT imaging of the chest was performed following the standard protocol during bolus administration of intravenous contrast.  Contrast: 80mL OMNIPAQUE IOHEXOL 300 MG/ML  SOLN  Comparison: Chest CT 09/22/2012.  Findings:  Mediastinum: Heart size is normal. There is no significant pericardial fluid, thickening or pericardial calcification. There is atherosclerosis of the thoracic aorta, the great vessels of the mediastinum and the coronary arteries, including calcified atherosclerotic plaque in the left main, left anterior descending and left circumflex coronary arteries. There is some prominent soft tissue in the right suprahilar region and best demonstrated on image 20 of series 2, which measures approximately 2.0 x 1.6 cm. This is only slightly more prominent than the prior examination and is highly nonspecific; favored to be part of the post radiation changes which are evolving in the right lung.  No other definite suspicious mediastinal or hilar lymphadenopathy is noted on today's examination.  Esophagus is unremarkable in appearance.  Lungs/Pleura: Extensive chronic postradiation changes are noted in the right upper lobe and  superior segment of the right lower lobe where there is volume loss, architectural distortion and traction bronchiectasis.  This is inseparable from this soft tissue prominence in the right suprahilar region and noted above.  No definite new suspicious appearing pulmonary nodules or masses are otherwise noted.  No acute consolidative air space disease.  No pleural effusions.  Upper Abdomen: Unremarkable.  Musculoskeletal: There are no aggressive appearing lytic or blastic lesions noted in the visualized portions of the skeleton.  IMPRESSION: 1.  Evolving postradiation changes in the right mid upper lung, as above.  There is a small area of soft tissue in the right suprahilar region which is favored to be part of this evolving process.  At this time, this is not favored to represent residual or recurrent disease, however, attention to this area on follow up studies is recommended to  ensure stability 2. Atherosclerosis, including left main and two-vessel coronary artery disease. Please note that although the presence of coronary artery calcium documents the presence of coronary artery disease, the severity of this disease and any potential stenosis cannot be assessed on this non-gated CT examination.  Assessment for potential risk factor modification, dietary therapy or pharmacologic therapy may be warranted, if clinically indicated.  .   Original Report Authenticated By: Trudie Reed, M.D.     ASSESSMENT:  Limited stage small cell lung cancer diagnosed in January of 2013 status post systemic chemotherapy with cisplatin and etoposide concurrent with radiation followed by prophylactic cranial irradiation. The patient has no evidence for disease progression on his recent scan.  PLAN:  Continue on observation for now with repeat CT scan of the chest with contrast in 4 months. Followup visit after the CT scan. The patient was advised to call immediately if she has any concerning symptoms in the  interval.  All questions were answered. The patient knows to call the clinic with any problems, questions or concerns. We can certainly see the patient much sooner if necessary.

## 2012-12-27 NOTE — Telephone Encounter (Signed)
gv and printed appt sche and avs for pt.Marland KitchenMarland Kitchen

## 2013-01-26 ENCOUNTER — Telehealth: Payer: Self-pay | Admitting: Internal Medicine

## 2013-01-26 NOTE — Telephone Encounter (Signed)
Patient is throwing up her lipitor pill. It is too big. She can only take very small pills. Please advise.

## 2013-01-26 NOTE — Telephone Encounter (Signed)
Recommend to take Lipitor 80 mg half tablet twice a day. If unable to do that, we'll consider Crestor 20 mg one tablet twice a day.

## 2013-01-27 NOTE — Telephone Encounter (Signed)
Left message to call office

## 2013-01-28 MED ORDER — ATORVASTATIN CALCIUM 40 MG PO TABS: 80.0000 mg | ORAL_TABLET | Freq: Every day | ORAL | Status: DC

## 2013-01-28 NOTE — Telephone Encounter (Signed)
Discussed with pt, sent rx to  Pharmacy.

## 2013-01-28 NOTE — Telephone Encounter (Signed)
Spoke to pt & she states that she is unable to get 1/2 tablet down either. Pt also stated that she has tried crestor in the past & was taken off of it soon after she started. Please advise.

## 2013-01-28 NOTE — Telephone Encounter (Signed)
If the tablet size is the issue, she could try lipitor 40 mg 2 tablets daily, she could take the 40 mg tablet crushed or take by halfs. I recommend her to ask the  Pharmacist to show her the actual pills to see if she can handle them. Make her aware her  insurance may not like to pay for the smallest pills , needs to discuss cost with them

## 2013-02-07 ENCOUNTER — Other Ambulatory Visit: Payer: BC Managed Care – PPO

## 2013-02-07 ENCOUNTER — Other Ambulatory Visit: Payer: Self-pay | Admitting: Internal Medicine

## 2013-02-07 NOTE — Telephone Encounter (Signed)
Refill done.  

## 2013-03-01 ENCOUNTER — Telehealth: Payer: Self-pay | Admitting: *Deleted

## 2013-03-01 NOTE — Telephone Encounter (Signed)
Message copied by Nada Maclachlan on Tue Mar 01, 2013  2:55 PM ------      Message from: [name] E      Created: Mon Feb 28, 2013  8:50 AM       Has she been taking her cholesterol medication?      Due for FLP, AST, ALT. Please arrange ------

## 2013-03-01 NOTE — Telephone Encounter (Signed)
noted 

## 2013-03-01 NOTE — Telephone Encounter (Signed)
Spoke to pt, she states that she has not been taking her cholesterol meds. Pt states that the pills are still just too big for her to swallow. Pt did state that she has bought a pill crusher & she is going to start using that to help her.

## 2013-03-02 ENCOUNTER — Encounter: Payer: Self-pay | Admitting: Internal Medicine

## 2013-03-28 ENCOUNTER — Telehealth: Payer: Self-pay | Admitting: Internal Medicine

## 2013-03-28 NOTE — Telephone Encounter (Signed)
Patient left vm on triage line stating she thinks she may have a hernia. She has a CT scan scheduled for 05/02/13 ordered by her oncologist and would like to know if she should wait for the results of that.

## 2013-03-29 NOTE — Telephone Encounter (Signed)
Thank you . Please ask her to come for a visit, I need to check the area and see if she needs any x-ray.

## 2013-03-29 NOTE — Telephone Encounter (Signed)
Spoke with patient and she states that the area protrudes at times especially if she coughs. It is located below the breast bone. Does not cause pain. States that "it's kinda like a jellyfish that moves around". Please advise if she cannot wait until the CT or if you think that will cover the area described. AND she's your biggest fan!!  Please advise.

## 2013-03-30 NOTE — Telephone Encounter (Signed)
LVM for patient. She returned call and scheduled office visit.

## 2013-04-01 ENCOUNTER — Ambulatory Visit (INDEPENDENT_AMBULATORY_CARE_PROVIDER_SITE_OTHER): Payer: BC Managed Care – PPO | Admitting: Internal Medicine

## 2013-04-01 VITALS — BP 118/80 | HR 90 | Temp 98.1°F | Wt 142.8 lb

## 2013-04-01 DIAGNOSIS — F341 Dysthymic disorder: Secondary | ICD-10-CM

## 2013-04-01 DIAGNOSIS — K469 Unspecified abdominal hernia without obstruction or gangrene: Secondary | ICD-10-CM

## 2013-04-01 DIAGNOSIS — E785 Hyperlipidemia, unspecified: Secondary | ICD-10-CM

## 2013-04-01 DIAGNOSIS — R197 Diarrhea, unspecified: Secondary | ICD-10-CM

## 2013-04-01 DIAGNOSIS — F418 Other specified anxiety disorders: Secondary | ICD-10-CM

## 2013-04-01 DIAGNOSIS — I10 Essential (primary) hypertension: Secondary | ICD-10-CM

## 2013-04-01 NOTE — Assessment & Plan Note (Signed)
Under excellent control, no change 

## 2013-04-01 NOTE — Progress Notes (Signed)
  Subjective:    Patient ID: [name] [name], female    DOB: Sep 03, 1950, 62 y.o.   MRN: [id number]  HPI Here to discuss the following issues: Hernia? Has noted a mass on and off in the upper abdomen with cough or straining. Sometimes it gets slightly sore.  Otherwise doing well.  Past Medical History  Diagnosis Date  . Hypertension   . High cholesterol   . Anxiety   . Osteoporosis   . Hx of radiation therapy 10/07/11 to 11/20/11    right lung  . Hx of radiation therapy 01/07/2012- 01/21/12    cranial irradiation  . Lung cancer 09/19/11    SMALL CELL CARCINOMA   Past Surgical History  Procedure Laterality Date  . Tubal ligation    . Cholecystectomy  04/2011    biliary stent placement    Review of Systems Hypertension, good medication compliance, not ambulatory BPs but blood pressure @ oncology very good. High cholesterol, never started Lipitor  , has a hard time swallowing large pills. Continue with diarrhea, align didn't help, taking Imodium as needed.     Objective:   Physical Exam  Constitutional: She appears well-developed and well-nourished. No distress.  Abdominal:    Skin: She is not diaphoretic.  Psychiatric: She has a normal mood and affect. Her behavior is normal. Judgment and thought content normal.       Assessment & Plan:

## 2013-04-01 NOTE — Assessment & Plan Note (Signed)
Align did not help, currently on Imodium when necessary after she called GI. Has a diagnosis of IBS.

## 2013-04-01 NOTE — Assessment & Plan Note (Signed)
Last cholesterol significantly elevated, she use to take Lipitor and Zetia when she was under the care of the lipid clinic. I recommend Lipitor 80 mg daily, she has a very hard time swallowing pills, then we recommend Lipitor 40 mg 2 tablets daily, she has not started to take it yet. Plan: Patient reports she will start taking Lipitor 40 mg 2 tablets daily (crushed) Come back in 3 months fasting

## 2013-04-01 NOTE — Assessment & Plan Note (Addendum)
Midline upper abdominal mass consistent with a hernia. Incisional?. We talk about possibly referral for surgical eval, patient not ready for that step. We agreed on observation. She is going to have a CT of the chest soon, very likely will be able to assess that area as well. To call if she has severe pain or increased swelling.

## 2013-04-01 NOTE — Patient Instructions (Signed)
Come back in 3 months, fasting, please make an appointment Check the  blood pressure 2 or 3 times a month, be sure it is between 110/60 and 140/85. If it is consistently higher or lower, let me know

## 2013-04-01 NOTE — Assessment & Plan Note (Signed)
Well controlled 

## 2013-04-03 ENCOUNTER — Encounter: Payer: Self-pay | Admitting: Internal Medicine

## 2013-04-14 ENCOUNTER — Telehealth: Payer: Self-pay | Admitting: Internal Medicine

## 2013-04-14 NOTE — Telephone Encounter (Signed)
Moved 9/10 appt to AM due to call day. S/w pt re change she is aware. Also per pt request called home phone and lmonvm. Other appts remain the same. S/w pt yesterday evening.

## 2013-04-29 LAB — HM MAMMOGRAPHY

## 2013-05-02 ENCOUNTER — Ambulatory Visit (HOSPITAL_COMMUNITY)
Admission: RE | Admit: 2013-05-02 | Discharge: 2013-05-02 | Disposition: A | Discharge status: Home / Self Care | Payer: BC Managed Care – PPO | Source: Ambulatory Visit | Attending: Internal Medicine | Admitting: Internal Medicine

## 2013-05-02 ENCOUNTER — Other Ambulatory Visit (HOSPITAL_BASED_OUTPATIENT_CLINIC_OR_DEPARTMENT_OTHER): Payer: BC Managed Care – PPO

## 2013-05-02 DIAGNOSIS — M948X9 Other specified disorders of cartilage, unspecified sites: Secondary | ICD-10-CM | POA: Insufficient documentation

## 2013-05-02 DIAGNOSIS — D649 Anemia, unspecified: Secondary | ICD-10-CM

## 2013-05-02 DIAGNOSIS — C3490 Malignant neoplasm of unspecified part of unspecified bronchus or lung: Secondary | ICD-10-CM | POA: Insufficient documentation

## 2013-05-02 DIAGNOSIS — C3491 Malignant neoplasm of unspecified part of right bronchus or lung: Secondary | ICD-10-CM

## 2013-05-02 DIAGNOSIS — C3410 Malignant neoplasm of upper lobe, unspecified bronchus or lung: Secondary | ICD-10-CM

## 2013-05-02 DIAGNOSIS — I2510 Atherosclerotic heart disease of native coronary artery without angina pectoris: Secondary | ICD-10-CM | POA: Insufficient documentation

## 2013-05-02 DIAGNOSIS — R05 Cough: Secondary | ICD-10-CM | POA: Insufficient documentation

## 2013-05-02 DIAGNOSIS — R059 Cough, unspecified: Secondary | ICD-10-CM | POA: Insufficient documentation

## 2013-05-02 DIAGNOSIS — J984 Other disorders of lung: Secondary | ICD-10-CM | POA: Insufficient documentation

## 2013-05-02 DIAGNOSIS — Z9221 Personal history of antineoplastic chemotherapy: Secondary | ICD-10-CM | POA: Insufficient documentation

## 2013-05-02 LAB — CBC WITH DIFFERENTIAL/PLATELET
Basophils Absolute: 0 10*3/uL (ref 0.0–0.1)
Eosinophils Absolute: 0.1 10*3/uL (ref 0.0–0.5)
HCT: 37.2 % (ref 34.8–46.6)
HGB: 12.7 g/dL (ref 11.6–15.9)
MONO#: 0.4 10*3/uL (ref 0.1–0.9)
NEUT#: 3.7 10*3/uL (ref 1.5–6.5)
NEUT%: 78 % — ABNORMAL HIGH (ref 38.4–76.8)
RDW: 12.3 % (ref 11.2–14.5)
lymph#: 0.5 10*3/uL — ABNORMAL LOW (ref 0.9–3.3)

## 2013-05-02 LAB — COMPREHENSIVE METABOLIC PANEL (CC13)
AST: 29 U/L (ref 5–34)
Albumin: 4.3 g/dL (ref 3.5–5.0)
BUN: 18.7 mg/dL (ref 7.0–26.0)
CO2: 24 mEq/L (ref 22–29)
Calcium: 9.8 mg/dL (ref 8.4–10.4)
Chloride: 100 mEq/L (ref 98–109)
Creatinine: 0.8 mg/dL (ref 0.6–1.1)
Glucose: 104 mg/dl (ref 70–140)
Potassium: 4.5 mEq/L (ref 3.5–5.1)

## 2013-05-02 MED ORDER — IOHEXOL 300 MG/ML  SOLN
80.0000 mL | Freq: Once | INTRAMUSCULAR | Status: AC | PRN
  Administered 2013-05-02: 80 mL via INTRAVENOUS

## 2013-05-04 ENCOUNTER — Encounter: Payer: Self-pay | Admitting: Internal Medicine

## 2013-05-04 ENCOUNTER — Telehealth: Payer: Self-pay | Admitting: Internal Medicine

## 2013-05-04 ENCOUNTER — Ambulatory Visit (HOSPITAL_BASED_OUTPATIENT_CLINIC_OR_DEPARTMENT_OTHER): Payer: BC Managed Care – PPO | Admitting: Internal Medicine

## 2013-05-04 DIAGNOSIS — C3490 Malignant neoplasm of unspecified part of unspecified bronchus or lung: Secondary | ICD-10-CM

## 2013-05-04 NOTE — Progress Notes (Signed)
[facility] [phone]   Fax:(336) [phone]  OFFICE PROGRESS NOTE  [name], MD [phone] [facility] [address] [place] [address]  DIAGNOSIS: Limited stage small cell lung cancer diagnosed in January of 2013   PRIOR THERAPY:  1) Systemic chemotherapy with cisplatin at 60 mg per meter square given on day 1 and etoposide at 120 mg per meter square given on days one 2 and 3 with Neulasta support given on day 4 status post 4 cycles, last cycle was given on 12/02/2011 . This with concurrent with radiotherapy under the care of Dr. Mitzi Hansen.  2) prophylactic cranial irradiation under the care of Dr. Mitzi Hansen completed on 01/21/2012   CURRENT THERAPY: Observation.   INTERVAL HISTORY: [name] [name] 62 y.o. female returns to the clinic today for routine follow up visit with repeat CT scan of the chest for restaging of her disease. The patient has no complaints today. She denied having any significant weight loss or night sweats. She denied having any chest pain, shortness breath, cough or hemoptysis.  MEDICAL HISTORY: Past Medical History  Diagnosis Date  . Hypertension   . High cholesterol   . Anxiety   . Osteoporosis   . Hx of radiation therapy 10/07/11 to 11/20/11    right lung  . Hx of radiation therapy 01/07/2012- 01/21/12    cranial irradiation  . Lung cancer 09/19/11    SMALL CELL CARCINOMA    ALLERGIES:  is allergic to codeine and penicillins.  MEDICATIONS:  Current Outpatient Prescriptions  Medication Sig Dispense Refill  . aspirin 81 MG tablet Take 81 mg by mouth daily.       Marland Kitchen atorvastatin (LIPITOR) 40 MG tablet Take 2 tablets (80 mg total) by mouth daily.  180 tablet  0  . citalopram (CELEXA) 40 MG tablet TAKE 1 TABLET BY MOUTH DAILY  30 tablet  3  . lisinopril (PRINIVIL,ZESTRIL) 20 MG tablet Take 1 tablet (20 mg total) by mouth daily.  30 tablet  6  . Loperamide HCl (IMODIUM PO) Take by mouth. As needed prior to work        . [DISCONTINUED] sucralfate (CARAFATE) 1 G tablet Take 1 tablet (1 g total) by mouth 4 (four) times daily. Dissolve tablet in 15cc water.  120 tablet  1   No current facility-administered medications for this visit.    SURGICAL HISTORY:  Past Surgical History  Procedure Laterality Date  . Tubal ligation    . Cholecystectomy  04/2011    biliary stent placement    REVIEW OF SYSTEMS:  A comprehensive review of systems was negative.   PHYSICAL EXAMINATION: General appearance: alert, cooperative and no distress Head: Normocephalic, without obvious abnormality, atraumatic Neck: no adenopathy Lymph nodes: Cervical, supraclavicular, and axillary nodes normal. Resp: clear to auscultation bilaterally Cardio: regular rate and rhythm, S1, S2 normal, no murmur, click, rub or gallop GI: soft, non-tender; bowel sounds normal; no masses,  no organomegaly Extremities: extremities normal, atraumatic, no cyanosis or edema  ECOG PERFORMANCE STATUS: 0 - Asymptomatic  Blood pressure 124/73, pulse 91, temperature 98 F (36.7 C), temperature source Oral, resp. rate 20, height 5\' 3"  (1.6 m), weight 144 lb (65.318 kg).  LABORATORY DATA: Lab Results  Component Value Date   WBC 4.7 05/02/2013   HGB 12.7 05/02/2013   HCT 37.2 05/02/2013   MCV 93.9 05/02/2013   PLT 203 05/02/2013      Chemistry      Component  Value Date/Time   NA 134* 05/02/2013 1004   NA 132* 12/03/2012 1138   NA 133 03/23/2012 1305   K 4.5 05/02/2013 1004   K 4.0 12/03/2012 1138   K 4.4 03/23/2012 1305   CL 104 12/23/2012 1338   CL 99 12/03/2012 1138   CL 93* 03/23/2012 1305   CO2 24 05/02/2013 1004   CO2 26 12/03/2012 1138   CO2 25 03/23/2012 1305   BUN 18.7 05/02/2013 1004   BUN 13 12/03/2012 1138   BUN 10 03/23/2012 1305   CREATININE 0.8 05/02/2013 1004   CREATININE 0.9 12/03/2012 1138   CREATININE 1.0 03/23/2012 1305      Component Value Date/Time   CALCIUM 9.8 05/02/2013 1004   CALCIUM 9.2 12/03/2012 1138   CALCIUM 9.4 03/23/2012 1305   ALKPHOS  44 05/02/2013 1004   ALKPHOS 50 04/01/2012 1417   ALKPHOS 48 03/23/2012 1305   AST 29 05/02/2013 1004   AST 21 12/03/2012 1138   AST 18 03/23/2012 1305   ALT 26 05/02/2013 1004   ALT 16 12/03/2012 1138   ALT 19 03/23/2012 1305   BILITOT 0.63 05/02/2013 1004   BILITOT 0.4 04/01/2012 1417   BILITOT 0.60 03/23/2012 1305       RADIOGRAPHIC STUDIES: Ct Chest W Contrast  05/02/2013   *RADIOLOGY REPORT*  Clinical Data: Lung cancer diagnosed 2013, chemotherapy complete, cough  CT CHEST WITH CONTRAST  Technique:  Multidetector CT imaging of the chest was performed following the standard protocol during bolus administration of intravenous contrast.  Contrast: 80mL OMNIPAQUE IOHEXOL 300 MG/ML  SOLN  Comparison: 12/23/2012  Findings: Right paramediastinal radiation changes.  Underlying 2.1 x 2.4 cm lesion in the right hilar region (series 2/image 21), previously 1.5 x 1.7 cm, worrisome for recurrence.  Lungs are otherwise clear.  No pleural effusion or pneumothorax.  Visualized thyroid is unremarkable.  The heart is normal in size.  No pericardial effusion.  Age advanced coronary atherosclerosis.  No suspicious mediastinal or axillary lymphadenopathy.  Visualized upper abdomen is unremarkable.  Sclerotic lesion in the right anterolateral 4th rib (series 5/image 30), new, possibly reflecting a healing fracture although metastasis is not excluded.  Mild degenerative changes of the visualized thoracolumbar spine.  IMPRESSION: 2.4 cm lesion in the right hilar region, increased, worrisome for recurrence.  Consider PET CT for further evaluation.  Superimposed right paramediastinal radiation changes.  New sclerotic lesion in the right anterolateral 4th rib, possibly reflecting a healing fracture, metastasis not excluded.  Correlate for history of interval trauma.   Original Report Authenticated By: Charline Bills, M.D.    ASSESSMENT AND PLAN: this is a very pleasant 62 years old white female with limited stage small cell lung  cancer status post systemic chemotherapy with cisplatin and etoposide concurrent with radiation followed by prophylactic cranial irradiation. The patient is feeling fine today with no specific complaints but CT scan of the chest showed enlargement of the right hilar lesion concerning for disease recurrence. I discussed the scan results and showed the images to the patient today. I recommended for her to have a PET scan performed for further evaluation of this lesion. I would see her back for follow up visit in 2 weeks for discussion of her PET scan results and further recommendation regarding her condition. She was advised to call immediately if she has any concerning symptoms in the interval.  The patient voices understanding of current disease status and treatment options and is in agreement with the current care plan.  All questions were answered. The patient knows to call the clinic with any problems, questions or concerns. We can certainly see the patient much sooner if necessary.

## 2013-05-04 NOTE — Telephone Encounter (Signed)
gv and printed appt schedand avs for pt for Sept °

## 2013-05-05 ENCOUNTER — Telehealth: Payer: Self-pay | Admitting: Medical Oncology

## 2013-05-05 NOTE — Telephone Encounter (Signed)
Asking if enlarged lymph node is cancer. I told her there is no diagnosis and Dr Arbutus Ped is ordering more tests.

## 2013-05-06 NOTE — Patient Instructions (Signed)
I ordered a PET scan for evaluation of the right hilar lesion. Followup visit in 2 weeks

## 2013-05-10 ENCOUNTER — Encounter (HOSPITAL_COMMUNITY): Payer: BC Managed Care – PPO

## 2013-05-13 ENCOUNTER — Encounter (HOSPITAL_COMMUNITY)
Admission: RE | Admit: 2013-05-13 | Discharge: 2013-05-13 | Disposition: A | Discharge status: Home / Self Care | Payer: BC Managed Care – PPO | Source: Ambulatory Visit | Attending: Internal Medicine | Admitting: Internal Medicine

## 2013-05-13 ENCOUNTER — Encounter (HOSPITAL_COMMUNITY): Payer: Self-pay

## 2013-05-13 ENCOUNTER — Other Ambulatory Visit: Payer: Self-pay | Admitting: Internal Medicine

## 2013-05-13 DIAGNOSIS — C3490 Malignant neoplasm of unspecified part of unspecified bronchus or lung: Secondary | ICD-10-CM | POA: Insufficient documentation

## 2013-05-13 LAB — GLUCOSE, CAPILLARY: Glucose-Capillary: 101 mg/dL — ABNORMAL HIGH (ref 70–99)

## 2013-05-13 MED ORDER — FLUDEOXYGLUCOSE F - 18 (FDG) INJECTION
20.1000 | Freq: Once | INTRAVENOUS | Status: AC | PRN
  Administered 2013-05-13: 20.1 via INTRAVENOUS

## 2013-05-18 ENCOUNTER — Ambulatory Visit (HOSPITAL_BASED_OUTPATIENT_CLINIC_OR_DEPARTMENT_OTHER): Payer: BC Managed Care – PPO | Admitting: Physician Assistant

## 2013-05-18 ENCOUNTER — Encounter: Payer: Self-pay | Admitting: Physician Assistant

## 2013-05-18 VITALS — BP 132/85 | HR 89 | Temp 98.5°F | Resp 18 | Ht 63.0 in | Wt 146.3 lb

## 2013-05-18 DIAGNOSIS — C3410 Malignant neoplasm of upper lobe, unspecified bronchus or lung: Secondary | ICD-10-CM

## 2013-05-18 DIAGNOSIS — C3490 Malignant neoplasm of unspecified part of unspecified bronchus or lung: Secondary | ICD-10-CM

## 2013-05-18 NOTE — Patient Instructions (Addendum)
Your PET scan revealed stable disease. There was no evidence for disease recurrence or progression Follow up with Dr. Arbutus Ped in 3 months with a restaging CT scan of your chest to re-evaluate your disease

## 2013-05-19 ENCOUNTER — Telehealth: Payer: Self-pay | Admitting: Internal Medicine

## 2013-05-19 NOTE — Progress Notes (Addendum)
[facility] Telephone:([phone]   [phone]  SHARED VISIT PROGRESS NOTE  [name], MD [phone] [address] [facility] [address] [place] [address]  DIAGNOSIS: Limited stage small cell lung cancer diagnosed in January of 2013   PRIOR THERAPY:  1) Systemic chemotherapy with cisplatin at 60 mg per meter square given on day 1 and etoposide at 120 mg per meter square given on days one 2 and 3 with Neulasta support given on day 4 status post 4 cycles, last cycle was given on 12/02/2011 . This with concurrent with radiotherapy under the care of Dr. Mitzi Hansen.  2) prophylactic cranial irradiation under the care of Dr. Mitzi Hansen completed on 01/21/2012   CURRENT THERAPY: Observation.   INTERVAL HISTORY: [name] [name] 62 y.o. female returns to the clinic today for routine follow up visit accompanied by her sister [name] [name]. She had a PET scan to reevaluate an area of that was questionable on the CT scan of her chest. She presents today to discuss the results of the PET scan. The patient has no complaints today. She denied having any significant weight loss or night sweats. She denied having any chest pain, shortness breath, cough or hemoptysis.  MEDICAL HISTORY: Past Medical History  Diagnosis Date  . Hypertension   . High cholesterol   . Anxiety   . Osteoporosis   . Hx of radiation therapy 10/07/11 to 11/20/11    right lung  . Hx of radiation therapy 01/07/2012- 01/21/12    cranial irradiation  . Lung cancer 09/19/11    SMALL CELL CARCINOMA    ALLERGIES:  is allergic to codeine and penicillins.  MEDICATIONS:  Current Outpatient Prescriptions  Medication Sig Dispense Refill  . aspirin 81 MG tablet Take 81 mg by mouth daily.       Marland Kitchen atorvastatin (LIPITOR) 40 MG tablet Take 2 tablets (80 mg total) by mouth daily.  180 tablet  0  . citalopram (CELEXA) 40 MG tablet TAKE 1 TABLET BY MOUTH DAILY  30 tablet  3  . lisinopril (PRINIVIL,ZESTRIL) 20 MG tablet Take  1 tablet (20 mg total) by mouth daily.  30 tablet  6  . Loperamide HCl (IMODIUM PO) Take by mouth. As needed prior to work      . [DISCONTINUED] sucralfate (CARAFATE) 1 G tablet Take 1 tablet (1 g total) by mouth 4 (four) times daily. Dissolve tablet in 15cc water.  120 tablet  1   No current facility-administered medications for this visit.    SURGICAL HISTORY:  Past Surgical History  Procedure Laterality Date  . Tubal ligation    . Cholecystectomy  04/2011    biliary stent placement    REVIEW OF SYSTEMS:  A comprehensive review of systems was negative.   PHYSICAL EXAMINATION: General appearance: alert, cooperative and no distress Head: Normocephalic, without obvious abnormality, atraumatic Neck: no adenopathy Lymph nodes: Cervical, supraclavicular, and axillary nodes normal. Resp: clear to auscultation bilaterally Cardio: regular rate and rhythm, S1, S2 normal, no murmur, click, rub or gallop GI: soft, non-tender; bowel sounds normal; no masses,  no organomegaly Extremities: extremities normal, atraumatic, no cyanosis or edema  ECOG PERFORMANCE STATUS: 0 - Asymptomatic  Blood pressure 132/85, pulse 89, temperature 98.5 F (36.9 C), temperature source Oral, resp. rate 18, height 5\' 3"  (1.6 m), weight 146 lb 4.8 oz (66.361 kg), SpO2 100.00%.  LABORATORY DATA: Lab Results  Component Value Date   WBC 4.7 05/02/2013   HGB 12.7  05/02/2013   HCT 37.2 05/02/2013   MCV 93.9 05/02/2013   PLT 203 05/02/2013      Chemistry      Component Value Date/Time   NA 134* 05/02/2013 1004   NA 132* 12/03/2012 1138   NA 133 03/23/2012 1305   K 4.5 05/02/2013 1004   K 4.0 12/03/2012 1138   K 4.4 03/23/2012 1305   CL 104 12/23/2012 1338   CL 99 12/03/2012 1138   CL 93* 03/23/2012 1305   CO2 24 05/02/2013 1004   CO2 26 12/03/2012 1138   CO2 25 03/23/2012 1305   BUN 18.7 05/02/2013 1004   BUN 13 12/03/2012 1138   BUN 10 03/23/2012 1305   CREATININE 0.8 05/02/2013 1004   CREATININE 0.9 12/03/2012 1138   CREATININE  1.0 03/23/2012 1305      Component Value Date/Time   CALCIUM 9.8 05/02/2013 1004   CALCIUM 9.2 12/03/2012 1138   CALCIUM 9.4 03/23/2012 1305   ALKPHOS 44 05/02/2013 1004   ALKPHOS 50 04/01/2012 1417   ALKPHOS 48 03/23/2012 1305   AST 29 05/02/2013 1004   AST 21 12/03/2012 1138   AST 18 03/23/2012 1305   ALT 26 05/02/2013 1004   ALT 16 12/03/2012 1138   ALT 19 03/23/2012 1305   BILITOT 0.63 05/02/2013 1004   BILITOT 0.4 04/01/2012 1417   BILITOT 0.60 03/23/2012 1305       RADIOGRAPHIC STUDIES:  Ct Chest W Contrast  05/02/2013   *RADIOLOGY REPORT*  Clinical Data: Lung cancer diagnosed 2013, chemotherapy complete, cough  CT CHEST WITH CONTRAST  Technique:  Multidetector CT imaging of the chest was performed following the standard protocol during bolus administration of intravenous contrast.  Contrast: 80mL OMNIPAQUE IOHEXOL 300 MG/ML  SOLN  Comparison: 12/23/2012  Findings: Right paramediastinal radiation changes.  Underlying 2.1 x 2.4 cm lesion in the right hilar region (series 2/image 21), previously 1.5 x 1.7 cm, worrisome for recurrence.  Lungs are otherwise clear.  No pleural effusion or pneumothorax.  Visualized thyroid is unremarkable.  The heart is normal in size.  No pericardial effusion.  Age advanced coronary atherosclerosis.  No suspicious mediastinal or axillary lymphadenopathy.  Visualized upper abdomen is unremarkable.  Sclerotic lesion in the right anterolateral 4th rib (series 5/image 30), new, possibly reflecting a healing fracture although metastasis is not excluded.  Mild degenerative changes of the visualized thoracolumbar spine.  IMPRESSION: 2.4 cm lesion in the right hilar region, increased, worrisome for recurrence.  Consider PET CT for further evaluation.  Superimposed right paramediastinal radiation changes.  New sclerotic lesion in the right anterolateral 4th rib, possibly reflecting a healing fracture, metastasis not excluded.  Correlate for history of interval trauma.   Original Report  Authenticated By: Charline Bills, M.D.   Nm Pet Image Restag (ps) Skull Base To Thigh  05/13/2013   CLINICAL DATA:  Subsequent treatment strategy for lung carcinoma.  EXAM: NUCLEAR MEDICINE PET SKULL BASE TO THIGH  FASTING BLOOD GLUCOSE:  Value:  101 mg/dl  TECHNIQUE: 09.8 mCi J-19 FDG was injected intravenously. CT data was obtained and used for attenuation correction and anatomic localization only. (This was not acquired as a diagnostic CT examination.) Additional exam technical data entered on technologist worksheet.  COMPARISON:  CT chest 05/02/2013  FINDINGS: NECK  No hypermetabolic lymph nodes in the neck.  CHEST  There is no focal hypermetabolic activity in the right hilum to correspond to mass lesion described on comparison CT. No clear mass lesion is identified although this  non contrast exam is limited in evaluation. There is a diffuse hypermetabolic activity associated with the atelectasis and bronchiectasis in the right lower lobe extending from the hilum in a pattern consistent with radiation change. There are no hypermetabolic mediastinal lymph nodes.  ABDOMEN/PELVIS  No abnormal hypermetabolic activity within the liver, pancreas, adrenal glands, or spleen. No hypermetabolic lymph nodes in the abdomen or pelvis.  SKELETON  No focal hypermetabolic activity to suggest skeletal metastasis.  IMPRESSION: 1. No focal hypermetabolic activity in the right hilum to suggests bronchogenic carcinoma recurrence.  2. More generalized hypermetabolic activity associated with the fibrosis, atelectasis, and bronchiectasis in the right lower lobe is most suggestive of postradiation change.  3. Recommend continued surveillance.   Electronically Signed   By: Genevive Bi M.D.   On: 05/13/2013 15:57   ASSESSMENT AND PLAN: this is a very pleasant 62 years old white female with limited stage small cell lung cancer status post systemic chemotherapy with cisplatin and etoposide concurrent with radiation followed by  prophylactic cranial irradiation. The patient is feeling fine today with no specific complaints but CT scan of the chest showed enlargement of the right hilar lesion concerning for disease recurrence. The PET scan however did not reveal any focal hypermetabolic activity to suggest bronchogenic carcinoma recurrence. The patient was discussed with an also seen by Dr. Arbutus Ped. He share the results of the PET scan with the patient and her sister. She will continue on observation and followup in 3 months with a repeat CT of the chest with contrast to reevaluate her disease.  Laural Benes, Mahsa Hanser E, PA-C   She was advised to call immediately if she has any concerning symptoms in the interval.  The patient voices understanding of current disease status and treatment options and is in agreement with the current care plan.  All questions were answered. The patient knows to call the clinic with any problems, questions or concerns. We can certainly see the patient much sooner if necessary.  ADDENDUM: Hematology/Oncology Attending: I had a face to face encounter with the patient during his visit. I recommended her care plan. The patient is feeling fine today with no specific complaints. She is very anxious about the results of the PET scan. She was found on recent CT scan of the chest to have an enlarging lesion in the right hilar area. The PET scan showed no significant activity in that area to suggest recurrent bronchogenic carcinoma. I discussed the PET scan results with the patient today. I recommended for her to continue on observation with repeat CT scan of the chest in 3 months. The patient was very excited and happy with the results today. She was advised to call immediately if she has any concerning symptoms in the interval. Lajuana Matte., MD 05/22/2013

## 2013-05-19 NOTE — Telephone Encounter (Signed)
lvm for pt regarding to dec appt advised that cs will contact to sched ct.

## 2013-05-27 ENCOUNTER — Ambulatory Visit (AMBULATORY_SURGERY_CENTER): Payer: Self-pay | Admitting: *Deleted

## 2013-05-27 VITALS — Ht 63.0 in | Wt 146.4 lb

## 2013-05-27 DIAGNOSIS — Z86010 Personal history of colonic polyps: Secondary | ICD-10-CM

## 2013-05-27 MED ORDER — MOVIPREP 100 G PO SOLR: 1.0000 | Freq: Once | ORAL | Status: DC

## 2013-05-27 NOTE — Progress Notes (Signed)
No egg or soy allergy. No anesthesia problems.  

## 2013-05-30 ENCOUNTER — Encounter: Payer: Self-pay | Admitting: Internal Medicine

## 2013-06-10 ENCOUNTER — Ambulatory Visit (AMBULATORY_SURGERY_CENTER): Payer: BC Managed Care – PPO | Admitting: Internal Medicine

## 2013-06-10 ENCOUNTER — Encounter: Payer: Self-pay | Admitting: Internal Medicine

## 2013-06-10 VITALS — BP 113/61 | HR 77 | Temp 97.3°F | Resp 13 | Ht 63.0 in | Wt 146.0 lb

## 2013-06-10 DIAGNOSIS — D126 Benign neoplasm of colon, unspecified: Secondary | ICD-10-CM

## 2013-06-10 DIAGNOSIS — Z86010 Personal history of colonic polyps: Secondary | ICD-10-CM

## 2013-06-10 MED ORDER — SODIUM CHLORIDE 0.9 % IV SOLN: 500.0000 mL | INTRAVENOUS | Status: DC

## 2013-06-10 NOTE — Patient Instructions (Signed)
YOU HAD AN ENDOSCOPIC PROCEDURE TODAY AT THE [facility]: Refer to the procedure report that was given to you for any specific questions about what was found during the examination.  If the procedure report does not answer your questions, please call your gastroenterologist to clarify.  If you requested that your care partner not be given the details of your procedure findings, then the procedure report has been included in a sealed envelope for you to review at your convenience later.  YOU SHOULD EXPECT: Some feelings of bloating in the abdomen. Passage of more gas than usual.  Walking can help get rid of the air that was put into your GI tract during the procedure and reduce the bloating. If you had a lower endoscopy (such as a colonoscopy or flexible sigmoidoscopy) you may notice spotting of blood in your stool or on the toilet paper. If you underwent a bowel prep for your procedure, then you may not have a normal bowel movement for a few days.  DIET: Your first meal following the procedure should be a light meal and then it is ok to progress to your normal diet.  A half-sandwich or bowl of soup is an example of a good first meal.  Heavy or fried foods are harder to digest and may make you feel nauseous or bloated.  Likewise meals heavy in dairy and vegetables can cause extra gas to form and this can also increase the bloating.  Drink plenty of fluids but you should avoid alcoholic beverages for 24 hours.  ACTIVITY: Your care partner should take you home directly after the procedure.  You should plan to take it easy, moving slowly for the rest of the day.  You can resume normal activity the day after the procedure however you should NOT DRIVE or use heavy machinery for 24 hours (because of the sedation medicines used during the test).    SYMPTOMS TO REPORT IMMEDIATELY: A gastroenterologist can be reached at any hour.  During normal business hours, 8:30 AM to 5:00 PM Monday through Friday,  call [phone].  After hours and on weekends, please call the GI answering service at [phone] who will take a message and have the physician on call contact you.   Following lower endoscopy (colonoscopy or flexible sigmoidoscopy):  Excessive amounts of blood in the stool  Significant tenderness or worsening of abdominal pains  Swelling of the abdomen that is new, acute  Fever of 100F or higher     FOLLOW UP: If any biopsies were taken you will be contacted by phone or by letter within the next 1-3 weeks.  Call your gastroenterologist if you have not heard about the biopsies in 3 weeks.  Our staff will call the home number listed on your records the next business day following your procedure to check on you and address any questions or concerns that you may have at that time regarding the information given to you following your procedure. This is a courtesy call and so if there is no answer at the home number and we have not heard from you through the emergency physician on call, we will assume that you have returned to your regular daily activities without incident.  SIGNATURES/CONFIDENTIALITY: You and/or your care partner have signed paperwork which will be entered into your electronic medical record.  These signatures attest to the fact that that the information above on your After Visit Summary has been reviewed and is understood.  Full responsibility of the   confidentiality of this discharge information lies with you and/or your care-partner.  Polyp information given.   Follow up colonoscopy in 5 years-  2019. 

## 2013-06-10 NOTE — Progress Notes (Signed)
Patient did not experience any of the following events: a burn prior to discharge; a fall within the facility; wrong site/side/patient/procedure/implant event; or a hospital transfer or hospital admission upon discharge from the facility. (G8907) Patient did not have preoperative order for IV antibiotic SSI prophylaxis. (G8918)  

## 2013-06-10 NOTE — Progress Notes (Signed)
Called to room to assist during endoscopic procedure.  Patient ID and intended procedure confirmed with present staff. Received instructions for my participation in the procedure from the performing physician.  

## 2013-06-10 NOTE — Op Note (Signed)
[facility] [address] [place], [address]   COLONOSCOPY PROCEDURE REPORT  PATIENT: [name] [name], Nordlund  MR#: [id number] BIRTHDATE: 07-Apr-1951 , 62  yrs. old GENDER: Female ENDOSCOPIST: Roxy Cedar, MD REFERRED NW:[id number] Program Recall PROCEDURE DATE:  06/10/2013 PROCEDURE:   Colonoscopy with snare polypectomy x 1 First Screening Colonoscopy - Avg.  risk and is 50 yrs.  old or older - No.  Prior Negative Screening - Now for repeat screening. N/A  History of Adenoma - Now for follow-up colonoscopy & has been > or = to 3 yrs.  Yes hx of adenoma.  Has been 3 or more years since last colonoscopy.  Polyps Removed Today? Yes. ASA CLASS:   Class III INDICATIONS:Patient's personal history of adenomatous colon polyps. Index exam [facility] 04-2006 (7mm adenoma of cecum) MEDICATIONS: MAC sedation, administered by CRNA and propofol (Diprivan) 350mg  IV  DESCRIPTION OF PROCEDURE:   After the risks benefits and alternatives of the procedure were thoroughly explained, informed consent was obtained.  A digital rectal exam revealed no abnormalities of the rectum.   The LB [id number]  endoscope was introduced through the anus and advanced to the cecum, which was identified by both the appendix and ileocecal valve. No adverse events experienced.   The quality of the prep was excellent, using MoviPrep  The instrument was then slowly withdrawn as the colon was fully examined.   COLON FINDINGS: A sessile polyp measuring 7 mm in size was found at the cecum.  A polypectomy was performed with a cold snare.  The resection was complete and the polyp tissue was completely retrieved.   The colon was otherwise normal.  There was no diverticulosis, inflammation, other polyps or cancers . Retroflexed views revealed no abnormalities. The time to cecum=3 minutes 08 seconds.  Withdrawal time=10 minutes 33 seconds.  The scope was withdrawn and the procedure  completed. COMPLICATIONS: There were no complications.  ENDOSCOPIC IMPRESSION: 1.   Sessile polyp measuring 7 mm in size was found at the cecum; polypectomy was performed with a cold snare 2.   The colon was otherwise normal  RECOMMENDATIONS: 1. Follow up colonoscopy in 5 years   eSigned:  Roxy Cedar, MD 06/10/2013 9:21 AM   cc: [name], MD and The Patient

## 2013-06-13 ENCOUNTER — Telehealth: Payer: Self-pay | Admitting: *Deleted

## 2013-06-13 NOTE — Telephone Encounter (Signed)
  Follow up Call-  Call back number 06/10/2013  Post procedure Call Back phone  # [phone]  Permission to leave phone message Yes     Patient questions:  Do you have a fever, pain , or abdominal swelling? no Pain Score  0 *  Have you tolerated food without any problems? yes  Have you been able to return to your normal activities? yes  Do you have any questions about your discharge instructions: Diet   no Medications  no Follow up visit  no  Do you have questions or concerns about your Care? no  Actions: * If pain score is 4 or above: No action needed, pain <4.

## 2013-06-14 ENCOUNTER — Other Ambulatory Visit: Payer: Self-pay | Admitting: Internal Medicine

## 2013-06-14 ENCOUNTER — Encounter: Payer: Self-pay | Admitting: Internal Medicine

## 2013-06-14 NOTE — Telephone Encounter (Signed)
Citalopram refill sent to pharmacy.  

## 2013-08-10 ENCOUNTER — Other Ambulatory Visit: Payer: Self-pay | Admitting: Internal Medicine

## 2013-08-10 NOTE — Telephone Encounter (Signed)
rx refilled per protocol. DJR  

## 2013-08-15 ENCOUNTER — Other Ambulatory Visit (HOSPITAL_BASED_OUTPATIENT_CLINIC_OR_DEPARTMENT_OTHER): Payer: BC Managed Care – PPO

## 2013-08-15 ENCOUNTER — Ambulatory Visit (HOSPITAL_COMMUNITY)
Admission: RE | Admit: 2013-08-15 | Discharge: 2013-08-15 | Disposition: A | Discharge status: Home / Self Care | Payer: BC Managed Care – PPO | Source: Ambulatory Visit | Attending: Physician Assistant | Admitting: Physician Assistant

## 2013-08-15 DIAGNOSIS — Y842 Radiological procedure and radiotherapy as the cause of abnormal reaction of the patient, or of later complication, without mention of misadventure at the time of the procedure: Secondary | ICD-10-CM | POA: Insufficient documentation

## 2013-08-15 DIAGNOSIS — C3490 Malignant neoplasm of unspecified part of unspecified bronchus or lung: Secondary | ICD-10-CM

## 2013-08-15 DIAGNOSIS — T66XXXA Radiation sickness, unspecified, initial encounter: Secondary | ICD-10-CM | POA: Insufficient documentation

## 2013-08-15 DIAGNOSIS — M47814 Spondylosis without myelopathy or radiculopathy, thoracic region: Secondary | ICD-10-CM | POA: Insufficient documentation

## 2013-08-15 DIAGNOSIS — C3410 Malignant neoplasm of upper lobe, unspecified bronchus or lung: Secondary | ICD-10-CM

## 2013-08-15 LAB — COMPREHENSIVE METABOLIC PANEL (CC13)
AST: 22 U/L (ref 5–34)
Albumin: 3.9 g/dL (ref 3.5–5.0)
Alkaline Phosphatase: 44 U/L (ref 40–150)
Calcium: 9.3 mg/dL (ref 8.4–10.4)
Chloride: 108 mEq/L (ref 98–109)
Glucose: 101 mg/dl (ref 70–140)
Potassium: 4.4 mEq/L (ref 3.5–5.1)
Sodium: 143 mEq/L (ref 136–145)
Total Protein: 6.7 g/dL (ref 6.4–8.3)

## 2013-08-15 LAB — CBC WITH DIFFERENTIAL/PLATELET
EOS%: 2.8 % (ref 0.0–7.0)
Eosinophils Absolute: 0.1 10*3/uL (ref 0.0–0.5)
HGB: 12.2 g/dL (ref 11.6–15.9)
MCV: 96.6 fL (ref 79.5–101.0)
MONO%: 9.3 % (ref 0.0–14.0)
NEUT#: 3.6 10*3/uL (ref 1.5–6.5)
RBC: 3.76 10*6/uL (ref 3.70–5.45)
RDW: 12.5 % (ref 11.2–14.5)
lymph#: 0.5 10*3/uL — ABNORMAL LOW (ref 0.9–3.3)

## 2013-08-15 MED ORDER — IOHEXOL 300 MG/ML  SOLN
80.0000 mL | Freq: Once | INTRAMUSCULAR | Status: AC | PRN
  Administered 2013-08-15: 80 mL via INTRAVENOUS

## 2013-08-16 ENCOUNTER — Ambulatory Visit (HOSPITAL_BASED_OUTPATIENT_CLINIC_OR_DEPARTMENT_OTHER): Payer: BC Managed Care – PPO | Admitting: Internal Medicine

## 2013-08-16 ENCOUNTER — Telehealth: Payer: Self-pay | Admitting: Internal Medicine

## 2013-08-16 ENCOUNTER — Encounter: Payer: Self-pay | Admitting: Internal Medicine

## 2013-08-16 VITALS — BP 149/77 | HR 95 | Temp 98.4°F | Resp 20 | Ht 63.0 in | Wt 152.0 lb

## 2013-08-16 DIAGNOSIS — C3491 Malignant neoplasm of unspecified part of right bronchus or lung: Secondary | ICD-10-CM

## 2013-08-16 DIAGNOSIS — C3490 Malignant neoplasm of unspecified part of unspecified bronchus or lung: Secondary | ICD-10-CM

## 2013-08-16 NOTE — Patient Instructions (Signed)
Followup visit in 6 months with repeat CT scan of the chest. 

## 2013-08-16 NOTE — Telephone Encounter (Signed)
s.w. pt and advised on June 2015 appt....pt ok and aware

## 2013-08-16 NOTE — Progress Notes (Signed)
[facility] Telephone:([phone]   Fax:(336) [phone]  OFFICE PROGRESS NOTE  [name], MD [phone] [address]. [facility] [address] [place] [address]  DIAGNOSIS: Limited stage small cell lung cancer diagnosed in January of 2013   PRIOR THERAPY:  1) Systemic chemotherapy with cisplatin at 60 mg per meter square given on day 1 and etoposide at 120 mg per meter square given on days one 2 and 3 with Neulasta support given on day 4 status post 4 cycles, last cycle was given on 12/02/2011 . This with concurrent with radiotherapy under the care of Dr. Mitzi Hansen.  2) prophylactic cranial irradiation under the care of Dr. Mitzi Hansen completed on 01/21/2012   CURRENT THERAPY: Observation.   INTERVAL HISTORY: [name] [name] 62 y.o. female returns to the clinic today for routine follow up visit with repeat CT scan of the chest for restaging of her disease. The patient has no complaints today. She denied having any significant weight loss or night sweats. She denied having any chest pain, shortness breath, cough or hemoptysis. She has no nausea or vomiting, no fever or chills.  MEDICAL HISTORY: Past Medical History  Diagnosis Date  . Hypertension   . High cholesterol   . Anxiety   . Osteoporosis   . Hx of radiation therapy 10/07/11 to 11/20/11    right lung  . Hx of radiation therapy 01/07/2012- 01/21/12    cranial irradiation  . Lung cancer 09/19/11    SMALL CELL CARCINOMA    ALLERGIES:  is allergic to codeine and penicillins.  MEDICATIONS:  Current Outpatient Prescriptions  Medication Sig Dispense Refill  . atorvastatin (LIPITOR) 40 MG tablet Take 2 tablets (80 mg total) by mouth daily.  180 tablet  0  . citalopram (CELEXA) 40 MG tablet TAKE 1 TABLET BY MOUTH DAILY  30 tablet  3  . lisinopril (PRINIVIL,ZESTRIL) 20 MG tablet TAKE 1 TABLET BY MOUTH DAILY  30 tablet  0  . Loperamide HCl (IMODIUM PO) Take by mouth. As needed prior to work      . aspirin 81 MG  tablet Take 81 mg by mouth daily.       . [DISCONTINUED] sucralfate (CARAFATE) 1 G tablet Take 1 tablet (1 g total) by mouth 4 (four) times daily. Dissolve tablet in 15cc water.  120 tablet  1   No current facility-administered medications for this visit.    SURGICAL HISTORY:  Past Surgical History  Procedure Laterality Date  . Tubal ligation    . Cholecystectomy  04/2011    biliary stent placement    REVIEW OF SYSTEMS:  A comprehensive review of systems was negative.   PHYSICAL EXAMINATION: General appearance: alert, cooperative and no distress Head: Normocephalic, without obvious abnormality, atraumatic Neck: no adenopathy Lymph nodes: Cervical, supraclavicular, and axillary nodes normal. Resp: clear to auscultation bilaterally Cardio: regular rate and rhythm, S1, S2 normal, no murmur, click, rub or gallop GI: soft, non-tender; bowel sounds normal; no masses,  no organomegaly Extremities: extremities normal, atraumatic, no cyanosis or edema  ECOG PERFORMANCE STATUS: 0 - Asymptomatic  Blood pressure 149/77, pulse 95, temperature 98.4 F (36.9 C), resp. rate 20, height 5\' 3"  (1.6 m), weight 152 lb (68.947 kg), SpO2 100.00%.  LABORATORY DATA: Lab Results  Component Value Date   WBC 4.7 08/15/2013   HGB 12.2 08/15/2013   HCT 36.3 08/15/2013   MCV 96.6 08/15/2013   PLT 193 08/15/2013      Chemistry  Component Value Date/Time   NA 143 08/15/2013 1005   NA 132* 12/03/2012 1138   NA 133 03/23/2012 1305   K 4.4 08/15/2013 1005   K 4.0 12/03/2012 1138   K 4.4 03/23/2012 1305   CL 104 12/23/2012 1338   CL 99 12/03/2012 1138   CL 93* 03/23/2012 1305   CO2 26 08/15/2013 1005   CO2 26 12/03/2012 1138   CO2 25 03/23/2012 1305   BUN 17.6 08/15/2013 1005   BUN 13 12/03/2012 1138   BUN 10 03/23/2012 1305   CREATININE 0.9 08/15/2013 1005   CREATININE 0.9 12/03/2012 1138   CREATININE 1.0 03/23/2012 1305      Component Value Date/Time   CALCIUM 9.3 08/15/2013 1005   CALCIUM 9.2  12/03/2012 1138   CALCIUM 9.4 03/23/2012 1305   ALKPHOS 44 08/15/2013 1005   ALKPHOS 50 04/01/2012 1417   ALKPHOS 48 03/23/2012 1305   AST 22 08/15/2013 1005   AST 21 12/03/2012 1138   AST 18 03/23/2012 1305   ALT 20 08/15/2013 1005   ALT 16 12/03/2012 1138   ALT 19 03/23/2012 1305   BILITOT 0.40 08/15/2013 1005   BILITOT 0.4 04/01/2012 1417   BILITOT 0.60 03/23/2012 1305       RADIOGRAPHIC STUDIES: Ct Chest W Contrast  08/15/2013   CLINICAL DATA:  Lung cancer  EXAM: CT CHEST WITH CONTRAST  TECHNIQUE: Multidetector CT imaging of the chest was performed during intravenous contrast administration.  CONTRAST:  80mL OMNIPAQUE IOHEXOL 300 MG/ML  SOLN  COMPARISON:  05/13/2013  FINDINGS: There is no pleural effusion identified. Radiation change within the right lung is identified the left lung is clear. There is no pulmonary nodule or suspicious mass identified. Subpleural thickening within the posterior medial right upper lobe is identified and appears unchanged. The heart size appears normal. There is no pericardial effusion. No enlarged mediastinal or hilar lymph nodes. There is no pericardial effusion. Calcification involving the LAD coronary artery noted. There is no axillary or supraclavicular adenopathy.  Incidental imaging through the upper abdomen is unremarkable.  Review of the visualized osseous structures is significant for mild thoracic spondylosis. No aggressive lytic or sclerotic bone lesions identified.  IMPRESSION: 1. No acute findings. 2. Stable radiation change within the right lung. No specific features identified to suggest residual or recurrence of tumor.   Electronically Signed   By: Signa Kell M.D.   On: 08/15/2013 13:57    ASSESSMENT AND PLAN: this is a very pleasant 62 years old white female with limited stage small cell lung cancer status post systemic chemotherapy with cisplatin and etoposide concurrent with radiation followed by prophylactic cranial irradiation. The patient is  feeling fine today with no specific complaints  Her recent scan showed no significant evidence for disease progression. I discussed the scan results with the patient today. I recommended for her to come back for followup visit in 6 months with repeat CT scan of the chest. She was advised to call immediately if she has any concerning symptoms in the interval.  The patient voices understanding of current disease status and treatment options and is in agreement with the current care plan.  All questions were answered. The patient knows to call the clinic with any problems, questions or concerns. We can certainly see the patient much sooner if necessary.

## 2013-08-19 ENCOUNTER — Telehealth: Payer: Self-pay | Admitting: *Deleted

## 2013-08-19 NOTE — Telephone Encounter (Signed)
Patient called and states that she needs to know if she get the pneumonia shot since she has lung cancer. Patient also states that she has a hernia disc . Patient states that she was wondering why is it just watched and nothing has been done. Please advise. SW

## 2013-08-19 NOTE — Telephone Encounter (Signed)
Answers can wait until Dr Drue Novel returns. Patient states that she has no pain from the hernia. Cannot workout or lift grandchildren. What would the next step be?

## 2013-08-19 NOTE — Telephone Encounter (Signed)
   She can take the pneumonia shot.  Dr. Drue Novel & Dr. Shirline Frees are monitoring the lung cancer. Dr. Drue Novel can discuss herniated disc with her upon his return.

## 2013-08-25 HISTORY — PX: INCISIONAL HERNIA REPAIR: SHX193

## 2013-11-17 ENCOUNTER — Telehealth: Payer: Self-pay | Admitting: Internal Medicine

## 2013-11-17 NOTE — Telephone Encounter (Signed)
Schedule routine office appointment

## 2013-11-17 NOTE — Telephone Encounter (Signed)
Left message for pt to call back.  Pt scheduled to see Dr. Henrene Pastor 01/06/14@2 :15pm. Pt aware of appt.

## 2013-11-17 NOTE — Telephone Encounter (Signed)
Pt has IBS and states that she has been having increasing problems with her diarrhea. Pt had colon done in October 2014. States she takes Imodium every morning and Align that Dr. Larose Kells suggested but this is not helping. Pt states the diarrhea is  starting to affect her life in that she is afraid to leave the house and go anywhere due to accidents. Pt wants to know if there is something else she can take to try and stop the diarrhea. Please advise.

## 2014-01-06 ENCOUNTER — Ambulatory Visit (INDEPENDENT_AMBULATORY_CARE_PROVIDER_SITE_OTHER): Payer: BC Managed Care – PPO | Admitting: Internal Medicine

## 2014-01-06 ENCOUNTER — Encounter: Payer: Self-pay | Admitting: Internal Medicine

## 2014-01-06 VITALS — BP 110/80 | HR 100 | Ht 63.0 in | Wt 146.0 lb

## 2014-01-06 DIAGNOSIS — D126 Benign neoplasm of colon, unspecified: Secondary | ICD-10-CM

## 2014-01-06 DIAGNOSIS — K589 Irritable bowel syndrome without diarrhea: Secondary | ICD-10-CM

## 2014-01-06 DIAGNOSIS — R197 Diarrhea, unspecified: Secondary | ICD-10-CM

## 2014-01-06 DIAGNOSIS — R159 Full incontinence of feces: Secondary | ICD-10-CM

## 2014-01-06 MED ORDER — CHOLESTYRAMINE 4 G PO PACK: 4.0000 g | PACK | Freq: Two times a day (BID) | ORAL | Status: DC

## 2014-01-06 NOTE — Progress Notes (Signed)
HISTORY OF PRESENT ILLNESS:  [name] [name] is a 63 y.o. female with history of hypertension, lung cancer, colon polyps (adenomatous), and prior cholecystectomy. She presents today regarding chronic problems with urgency, loose stools, and incontinence. I last saw the patient 06/10/2013 when she underwent surveillance colonoscopy. She was found to have a 7 mm sessile cecal polyp which was removed and found to be tubular adenoma. Followup in 5 years recommended. She reports to me having about one bowel movement per day. However, can have 2-3 incontinent episodes per week. Often postprandial urgency with the inability to control her bowel movements. Problem seems to be worse since her cholecystectomy, she says. Wears protective pad. No nocturnal symptoms. No weight loss. No bleeding. All habits can be performed but a generally soft or loose. She has been taking 2 Imodium daily for some time. More recently using align with modest improvement. No abdominal pain or other issues.  REVIEW OF SYSTEMS:  All non-GI ROS negative except for anxiety  Past Medical History  Diagnosis Date  . Hypertension   . High cholesterol   . Anxiety   . Osteoporosis   . Hx of radiation therapy 10/07/11 to 11/20/11    right lung  . Hx of radiation therapy 01/07/2012- 01/21/12    cranial irradiation  . Lung cancer 09/19/11    SMALL CELL CARCINOMA  . Cholelithiasis   . Colon polyps     adenomatous    Past Surgical History  Procedure Laterality Date  . Tubal ligation    . Cholecystectomy  04/2011    biliary stent placement    Social History Jessaca C Stetzel  reports that she quit smoking about 2 years ago. Her smoking use included Cigarettes. She smoked 0.00 packs per day. She has never used smokeless tobacco. She reports that she drinks alcohol. She reports that she does not use illicit drugs.  family history includes Atrial fibrillation in her mother; Bladder Cancer in her father; Clotting disorder in  her mother; Colon cancer (age of onset: 38) in her maternal aunt; Colon polyps in her father, mother, and sister; Heart disease in her mother; Stroke in her father.  Allergies  Allergen Reactions  . Codeine Nausea And Vomiting  . Penicillins Rash       PHYSICAL EXAMINATION: Vital signs: BP 110/80  Pulse 100  Ht 5\' 3"  (1.6 m)  Wt 146 lb (66.225 kg)  BMI 25.87 kg/m2  Constitutional: generally well-appearing, no acute distress Psychiatric: alert and oriented x3, cooperative Eyes: extraocular movements intact, anicteric, conjunctiva pink Mouth: oral pharynx moist, no lesions Neck: supple no lymphadenopathy Cardiovascular: heart regular rate and rhythm, no murmur Lungs: clear to auscultation bilaterally Abdomen: soft, nontender, nondistended, no obvious ascites, no peritoneal signs, normal bowel sounds, no organomegaly Rectal: Sensation intact. No mass or tenderness. Hemoccult negative stool. Decreased squeeze on command/decreased tone Extremities: no lower extremity edema bilaterally Skin: no lesions on visible extremities Neuro: No focal deficits.   ASSESSMENT:  #1. Diarrhea predominant IBS with incontinence. #2. Fecal incontinence exacerbated by decreased rectal tone #3. History of adenomatous colon polyps. Last colonoscopy October 2014 #4. Status post cholecystectomy 2012 #5. Low stage lung cancer. History of chemoradiation therapy. Under observation   PLAN:  #1. Continue Imodium #2. At Questran 4 g twice a day #3. Contact my office nurse in 4-6 weeks with update. #4. Surveillance colonoscopy around October 2019 #5. Wear protective undergarments regularly

## 2014-01-06 NOTE — Patient Instructions (Signed)
We have sent the following medications to your pharmacy for you to pick up at your convenience:  Questran  Please call Vaughan Basta, Dr. Blanch Media nurse, in 4-6 weeks to give her an update

## 2014-01-10 ENCOUNTER — Telehealth: Payer: Self-pay | Admitting: Internal Medicine

## 2014-01-10 NOTE — Telephone Encounter (Signed)
This is not carcinogenic. Please take as prescribed to see if this helps the diarrhea

## 2014-01-10 NOTE — Telephone Encounter (Signed)
Pt was prescribed Questran for diarrhea. She picked up the script and is afraid to continue taking it because she read that it is full of aspartame and she has already had cancer. Pt wants to know if there is an alternative that she can take. Please advise.

## 2014-01-10 NOTE — Telephone Encounter (Signed)
Pt aware.

## 2014-02-13 ENCOUNTER — Other Ambulatory Visit (HOSPITAL_BASED_OUTPATIENT_CLINIC_OR_DEPARTMENT_OTHER): Payer: BC Managed Care – PPO

## 2014-02-13 ENCOUNTER — Ambulatory Visit (HOSPITAL_COMMUNITY)
Admission: RE | Admit: 2014-02-13 | Discharge: 2014-02-13 | Disposition: A | Discharge status: Home / Self Care | Payer: BC Managed Care – PPO | Source: Ambulatory Visit | Attending: Internal Medicine | Admitting: Internal Medicine

## 2014-02-13 ENCOUNTER — Encounter (HOSPITAL_COMMUNITY): Payer: Self-pay

## 2014-02-13 DIAGNOSIS — C3490 Malignant neoplasm of unspecified part of unspecified bronchus or lung: Secondary | ICD-10-CM | POA: Insufficient documentation

## 2014-02-13 DIAGNOSIS — Y842 Radiological procedure and radiotherapy as the cause of abnormal reaction of the patient, or of later complication, without mention of misadventure at the time of the procedure: Secondary | ICD-10-CM | POA: Insufficient documentation

## 2014-02-13 DIAGNOSIS — K7689 Other specified diseases of liver: Secondary | ICD-10-CM | POA: Insufficient documentation

## 2014-02-13 DIAGNOSIS — C3491 Malignant neoplasm of unspecified part of right bronchus or lung: Secondary | ICD-10-CM

## 2014-02-13 DIAGNOSIS — T66XXXA Radiation sickness, unspecified, initial encounter: Secondary | ICD-10-CM | POA: Insufficient documentation

## 2014-02-13 DIAGNOSIS — C3410 Malignant neoplasm of upper lobe, unspecified bronchus or lung: Secondary | ICD-10-CM

## 2014-02-13 DIAGNOSIS — I2510 Atherosclerotic heart disease of native coronary artery without angina pectoris: Secondary | ICD-10-CM | POA: Insufficient documentation

## 2014-02-13 LAB — CBC WITH DIFFERENTIAL/PLATELET
BASO%: 0.8 % (ref 0.0–2.0)
BASOS ABS: 0 10*3/uL (ref 0.0–0.1)
EOS ABS: 0.2 10*3/uL (ref 0.0–0.5)
EOS%: 3 % (ref 0.0–7.0)
HCT: 36.5 % (ref 34.8–46.6)
HEMOGLOBIN: 12.1 g/dL (ref 11.6–15.9)
LYMPH#: 0.5 10*3/uL — AB (ref 0.9–3.3)
LYMPH%: 9.6 % — ABNORMAL LOW (ref 14.0–49.7)
MCH: 31.6 pg (ref 25.1–34.0)
MCHC: 33.1 g/dL (ref 31.5–36.0)
MCV: 95.4 fL (ref 79.5–101.0)
MONO#: 0.5 10*3/uL (ref 0.1–0.9)
MONO%: 9 % (ref 0.0–14.0)
NEUT#: 4 10*3/uL (ref 1.5–6.5)
NEUT%: 77.6 % — ABNORMAL HIGH (ref 38.4–76.8)
Platelets: 215 10*3/uL (ref 145–400)
RBC: 3.83 10*6/uL (ref 3.70–5.45)
RDW: 12.9 % (ref 11.2–14.5)
WBC: 5.2 10*3/uL (ref 3.9–10.3)

## 2014-02-13 LAB — COMPREHENSIVE METABOLIC PANEL (CC13)
ALBUMIN: 4.1 g/dL (ref 3.5–5.0)
ALT: 19 U/L (ref 0–55)
ANION GAP: 9 meq/L (ref 3–11)
AST: 21 U/L (ref 5–34)
Alkaline Phosphatase: 38 U/L — ABNORMAL LOW (ref 40–150)
BUN: 21.3 mg/dL (ref 7.0–26.0)
CO2: 26 mEq/L (ref 22–29)
CREATININE: 1 mg/dL (ref 0.6–1.1)
Calcium: 9.7 mg/dL (ref 8.4–10.4)
Chloride: 105 mEq/L (ref 98–109)
GLUCOSE: 115 mg/dL (ref 70–140)
Potassium: 4.9 mEq/L (ref 3.5–5.1)
Sodium: 140 mEq/L (ref 136–145)
Total Bilirubin: 0.38 mg/dL (ref 0.20–1.20)
Total Protein: 6.7 g/dL (ref 6.4–8.3)

## 2014-02-13 MED ORDER — IOHEXOL 300 MG/ML  SOLN
80.0000 mL | Freq: Once | INTRAMUSCULAR | Status: AC | PRN
  Administered 2014-02-13: 80 mL via INTRAVENOUS

## 2014-02-15 ENCOUNTER — Encounter: Payer: Self-pay | Admitting: Internal Medicine

## 2014-02-15 ENCOUNTER — Ambulatory Visit (INDEPENDENT_AMBULATORY_CARE_PROVIDER_SITE_OTHER): Payer: BC Managed Care – PPO | Admitting: Internal Medicine

## 2014-02-15 VITALS — BP 121/76 | HR 95 | Temp 98.0°F | Wt 147.0 lb

## 2014-02-15 DIAGNOSIS — F418 Other specified anxiety disorders: Secondary | ICD-10-CM

## 2014-02-15 DIAGNOSIS — M79669 Pain in unspecified lower leg: Secondary | ICD-10-CM

## 2014-02-15 DIAGNOSIS — E785 Hyperlipidemia, unspecified: Secondary | ICD-10-CM

## 2014-02-15 DIAGNOSIS — F341 Dysthymic disorder: Secondary | ICD-10-CM

## 2014-02-15 DIAGNOSIS — M79609 Pain in unspecified limb: Secondary | ICD-10-CM

## 2014-02-15 DIAGNOSIS — E7800 Pure hypercholesterolemia, unspecified: Secondary | ICD-10-CM

## 2014-02-15 LAB — LIPID PANEL
CHOLESTEROL: 253 mg/dL — AB (ref 0–200)
HDL: 96.8 mg/dL (ref >39.00)
LDL Cholesterol: 145 mg/dL — ABNORMAL HIGH (ref 0–99)
NonHDL: 156.2
Total CHOL/HDL Ratio: 3
Triglycerides: 58 mg/dL (ref 0.0–149.0)
VLDL: 11.6 mg/dL (ref 0.0–40.0)

## 2014-02-15 LAB — CK: CK TOTAL: 84 U/L (ref 7–177)

## 2014-02-15 LAB — TSH: TSH: 0.82 u[IU]/mL (ref 0.35–4.50)

## 2014-02-15 LAB — FOLATE: Folate: 11.4 ng/mL (ref >5.9)

## 2014-02-15 LAB — SEDIMENTATION RATE: Sed Rate: 20 mm/hr (ref 0–22)

## 2014-02-15 LAB — VITAMIN B12: VITAMIN B 12: 167 pg/mL — AB (ref 211–911)

## 2014-02-15 LAB — VITAMIN D 25 HYDROXY (VIT D DEFICIENCY, FRACTURES): VITD: 23.43 ng/mL

## 2014-02-15 MED ORDER — ALPRAZOLAM 0.25 MG PO TABS: 0.2500 mg | ORAL_TABLET | Freq: Every day | ORAL | Status: DC | PRN

## 2014-02-15 NOTE — Assessment & Plan Note (Signed)
Has been taking Lipitor consistently, now developed lower extremity pain, plan: FLP Hold Lipitor

## 2014-02-15 NOTE — Patient Instructions (Addendum)
Get your blood work before you leave    Stop lipitor   Call in 3 weeks and let us know how you are doing   Next visit is for a physical exam in 3 months   fasting Please make an appointment

## 2014-02-15 NOTE — Assessment & Plan Note (Signed)
Myalgias as described above, Etiology not clear, differential diagnosis includes statins use, PMR, Neuropathy, other neurological processes. Labs reviewed, will get a TSH, vitamins, CK and sedimentation rate. Hold Lipitor. We'll see how she's doing in 3 weeks, see instructions. Consider further eval

## 2014-02-15 NOTE — Progress Notes (Signed)
Pre visit review using our clinic review tool, if applicable. No additional management support is needed unless otherwise documented below in the visit note. 

## 2014-02-15 NOTE — Assessment & Plan Note (Signed)
Symptoms well controlled, request a Xanax refill

## 2014-02-15 NOTE — Progress Notes (Signed)
Subjective:    Patient ID: [name] [name], female    DOB: 1951/01/16, 63 y.o.   MRN: [id number]  DOS:  02/15/2014 Type of  Visit: acute History: At least 2 months history of bilateral leg pain,proximal> distal, Pain is described as a soreness "like I run the day before", constant, increased with walking. The only thing she is doing different is taking Cholestyramine rx by GI but the pain started before such medication. Good compliance with Lipitor and wonders if that medication is causing the pain.  Also has mild anxiety, very rarely takes Xanax, needs a prescription.    ROS Denies any motor deficits. No lower extremity paresthesias or back pain. No headache, weight loss, fever, chills or a rash. She is actually not very active, not going to the gym as she used to  Past Medical History  Diagnosis Date  . Hypertension   . High cholesterol   . Anxiety   . Osteoporosis   . Hx of radiation therapy 10/07/11 to 11/20/11    right lung  . Hx of radiation therapy 01/07/2012- 01/21/12    cranial irradiation  . Lung cancer 09/19/11    SMALL CELL CARCINOMA  . Cholelithiasis   . Colon polyps     adenomatous    Past Surgical History  Procedure Laterality Date  . Tubal ligation    . Cholecystectomy  04/2011    biliary stent placement    History   Social History  . Marital Status: Divorced    Spouse Name: N/A    Number of Children: 2  . Years of Education: N/A   Occupational History  . retired    Social History Main Topics  . Smoking status: Former Smoker    Types: Cigarettes    Quit date: 09/08/2011  . Smokeless tobacco: Never Used  . Alcohol Use: Yes     Comment: occasional  . Drug Use: No  . Sexual Activity: Not on file   Other Topics Concern  . Not on file   Social History Narrative   divorced, 2 kids, lives by herself                     Medication List       This list is accurate as of: 02/15/14 11:59 PM.  Always use your most recent med list.                ALIGN 4 MG Caps  Take 1 capsule by mouth every morning.     ALPRAZolam 0.25 MG tablet  Commonly known as:  XANAX  Take 1 tablet (0.25 mg total) by mouth daily as needed. For anxiety     BIOTIN PO  Take 1 capsule by mouth daily. 10,000 mg     cholestyramine 4 G packet  Commonly known as:  QUESTRAN  Take 1 packet (4 g total) by mouth 2 (two) times daily.     citalopram 40 MG tablet  Commonly known as:  CELEXA  TAKE 1 TABLET BY MOUTH DAILY     GAS-X PO  Take 2 tablets by mouth daily.     IMODIUM PO  Take 45 mLs by mouth 2 (two) times daily.     lisinopril 20 MG tablet  Commonly known as:  PRINIVIL,ZESTRIL  TAKE 1 TABLET BY MOUTH DAILY           Objective:   Physical Exam BP 121/76  Pulse 95  Temp(Src) 98 F (36.7 C)  Wt  147 lb (66.679 kg)  SpO2 96% General -- alert, well-developed, NAD.   Lungs -- normal respiratory effort, no intercostal retractions, no accessory muscle use, and normal breath sounds.  Heart-- normal rate, regular rhythm, no murmur.  Abdomen-- Not distended, good bowel sounds,soft, non-tender. Extremities-- no pretibial edema bilaterally, normal femoral-pedal pulses ; muscles no TTP Neurologic--  alert & oriented X3. Speech normal, gait appropriate for age, strength symmetric and appropriate for age.  DTRs symmetric (decreased symmetrically LE) Back-- no TTP  Psych-- Cognition and judgment appear intact. Cooperative with normal attention span and concentration. No anxious or depressed appearing.     Assessment & Plan:

## 2014-02-20 ENCOUNTER — Ambulatory Visit (HOSPITAL_BASED_OUTPATIENT_CLINIC_OR_DEPARTMENT_OTHER): Payer: BC Managed Care – PPO | Admitting: Internal Medicine

## 2014-02-20 ENCOUNTER — Encounter: Payer: Self-pay | Admitting: Internal Medicine

## 2014-02-20 ENCOUNTER — Telehealth: Payer: Self-pay | Admitting: Internal Medicine

## 2014-02-20 VITALS — BP 113/73 | HR 92 | Temp 98.2°F | Resp 18 | Ht 63.0 in | Wt 146.9 lb

## 2014-02-20 DIAGNOSIS — C3491 Malignant neoplasm of unspecified part of right bronchus or lung: Secondary | ICD-10-CM

## 2014-02-20 DIAGNOSIS — C3410 Malignant neoplasm of upper lobe, unspecified bronchus or lung: Secondary | ICD-10-CM

## 2014-02-20 DIAGNOSIS — M810 Age-related osteoporosis without current pathological fracture: Secondary | ICD-10-CM

## 2014-02-20 DIAGNOSIS — I10 Essential (primary) hypertension: Secondary | ICD-10-CM

## 2014-02-20 NOTE — Telephone Encounter (Signed)
gv and printed appt sched and avs for pt fro Dec

## 2014-02-20 NOTE — Progress Notes (Signed)
[place] Telephone:(336) [phone]   Fax:(336) [place], MD [place] [facility] [place] [address]  DIAGNOSIS: Limited stage small cell lung cancer diagnosed in January of 2013   PRIOR THERAPY:  1) Systemic chemotherapy with cisplatin at 60 mg per meter square given on day 1 and etoposide at 120 mg per meter square given on days one 2 and 3 with Neulasta support given on day 4 status post 4 cycles, last cycle was given on 12/02/2011 . This with concurrent with radiotherapy under the care of Dr. Lisbeth Renshaw.  2) prophylactic cranial irradiation under the care of Dr. Lisbeth Renshaw completed on 01/21/2012   CURRENT THERAPY: Observation.  INTERVAL HISTORY: [name] [name] 63 y.o. female returns to the clinic today for routine follow up visit accompanied by her sister. The patient has been feeling well with no specific complaints.  She denied having any significant weight loss or night sweats. She d enied having any chest pain, shortness breath, cough or hemoptysis. She has no nausea or vomiting, no fever or chills. She had repeat CT scan of the chest performed recently and she is here for evaluation and discussion of her scan results.  MEDICAL HISTORY: Past Medical History  Diagnosis Date  . Hypertension   . High cholesterol   . Anxiety   . Osteoporosis   . Hx of radiation therapy 10/07/11 to 11/20/11    right lung  . Hx of radiation therapy 01/07/2012- 01/21/12    cranial irradiation  . Lung cancer 09/19/11    SMALL CELL CARCINOMA  . Cholelithiasis   . Colon polyps     adenomatous    ALLERGIES:  is allergic to codeine and penicillins.  MEDICATIONS:  Current Outpatient Prescriptions  Medication Sig Dispense Refill  . ALPRAZolam (XANAX) 0.25 MG tablet Take 1 tablet (0.25 mg total) by mouth daily as needed. For anxiety  30 tablet  0  . BIOTIN PO Take 1 capsule by mouth daily. 10,000 mg      . cholestyramine  (QUESTRAN) 4 G packet Take 1 packet (4 g total) by mouth 2 (two) times daily.  60 each  3  . citalopram (CELEXA) 40 MG tablet TAKE 1 TABLET BY MOUTH DAILY  30 tablet  3  . lisinopril (PRINIVIL,ZESTRIL) 20 MG tablet TAKE 1 TABLET BY MOUTH DAILY  30 tablet  0  . Loperamide HCl (IMODIUM PO) Take 45 mLs by mouth 2 (two) times daily.       . Probiotic Product (ALIGN) 4 MG CAPS Take 1 capsule by mouth every morning.      . Simethicone (GAS-X PO) Take 2 tablets by mouth daily.      . [DISCONTINUED] sucralfate (CARAFATE) 1 G tablet Take 1 tablet (1 g total) by mouth 4 (four) times daily. Dissolve tablet in 15cc water.  120 tablet  1   No current facility-administered medications for this visit.    SURGICAL HISTORY:  Past Surgical History  Procedure Laterality Date  . Tubal ligation    . Cholecystectomy  04/2011    biliary stent placement    REVIEW OF SYSTEMS:  A comprehensive review of systems was negative.   PHYSICAL EXAMINATION: General appearance: alert, cooperative and no distress Head: Normocephalic, without obvious abnormality, atraumatic Neck: no adenopathy Lymph nodes: Cervical, supraclavicular, and axillary nodes normal. Resp: clear to auscultation bilaterally Cardio: regular rate and rhythm, S1, S2 normal, no murmur, click, rub  or gallop GI: soft, non-tender; bowel sounds normal; no masses,  no organomegaly Extremities: extremities normal, atraumatic, no cyanosis or edema  ECOG PERFORMANCE STATUS: 0 - Asymptomatic  Blood pressure 113/73, pulse 92, temperature 98.2 F (36.8 C), temperature source Oral, resp. rate 18, height 5\' 3"  (1.6 m), weight 146 lb 14.4 oz (66.633 kg).  LABORATORY DATA: Lab Results  Component Value Date   WBC 5.2 02/13/2014   HGB 12.1 02/13/2014   HCT 36.5 02/13/2014   MCV 95.4 02/13/2014   PLT 215 02/13/2014      Chemistry      Component Value Date/Time   NA 140 02/13/2014 0919   NA 132* 12/03/2012 1138   NA 133 03/23/2012 1305   K 4.9 02/13/2014 0919     K 4.0 12/03/2012 1138   K 4.4 03/23/2012 1305   CL 104 12/23/2012 1338   CL 99 12/03/2012 1138   CL 93* 03/23/2012 1305   CO2 26 02/13/2014 0919   CO2 26 12/03/2012 1138   CO2 25 03/23/2012 1305   BUN 21.3 02/13/2014 0919   BUN 13 12/03/2012 1138   BUN 10 03/23/2012 1305   CREATININE 1.0 02/13/2014 0919   CREATININE 0.9 12/03/2012 1138   CREATININE 1.0 03/23/2012 1305      Component Value Date/Time   CALCIUM 9.7 02/13/2014 0919   CALCIUM 9.2 12/03/2012 1138   CALCIUM 9.4 03/23/2012 1305   ALKPHOS 38* 02/13/2014 0919   ALKPHOS 50 04/01/2012 1417   ALKPHOS 48 03/23/2012 1305   AST 21 02/13/2014 0919   AST 21 12/03/2012 1138   AST 18 03/23/2012 1305   ALT 19 02/13/2014 0919   ALT 16 12/03/2012 1138   ALT 19 03/23/2012 1305   BILITOT 0.38 02/13/2014 0919   BILITOT 0.4 04/01/2012 1417   BILITOT 0.60 03/23/2012 1305       RADIOGRAPHIC STUDIES: Ct Chest W Contrast  02/13/2014   CLINICAL DATA:  Restaging lung cancer.  EXAM: CT CHEST WITH CONTRAST  TECHNIQUE: Multidetector CT imaging of the chest was performed during intravenous contrast administration.  CONTRAST:  70mL OMNIPAQUE IOHEXOL 300 MG/ML  SOLN  COMPARISON:  08/15/2013  FINDINGS: The chest wall is unremarkable and stable. No breast masses, supraclavicular or axillary adenopathy. The thyroid gland is normal. The bony thorax is intact. No destructive bone lesions or spinal canal compromise.  The heart is normal in size. No pericardial effusion. No mediastinal or hilar mass or adenopathy. Stable coronary artery calcifications. The thoracic aorta is normal in caliber. No dissection. The esophagus is unremarkable.  Examination of the lung parenchyma demonstrates stable radiation changes involving the right paramediastinal long. No CT findings to suggest recurrent tumor. No pulmonary metastatic disease. No acute pulmonary findings. No pleural effusion.  The upper abdomen is unremarkable. There is mild diffuse fatty infiltration of the liver but no findings for  upper abdominal metastatic disease.  IMPRESSION: Stable radiation changes involving the right paramediastinal lung. No findings to suggest recurrent or metastatic disease. No mediastinal or hilar mass or adenopathy.  The upper abdomen is unremarkable and stable. No upper abdominal metastatic disease.   Electronically Signed   By: Kalman Jewels M.D.   On: 02/13/2014 10:56   ASSESSMENT AND PLAN: this is a very pleasant 63 years old white female with limited stage small cell lung cancer status post systemic chemotherapy with cisplatin and etoposide concurrent with radiation followed by prophylactic cranial irradiation. The patient is feeling fine today with no specific complaints  Her recent scan  showed no significant evidence for disease progression. I discussed the scan results with the patient today. I recommended for her to come back for followup visit in 6 months with repeat CT scan of the chest. She was advised to call immediately if she has any concerning symptoms in the interval.  The patient voices understanding of current disease status and treatment options and is in agreement with the current care plan.  All questions were answered. The patient knows to call the clinic with any problems, questions or concerns. We can certainly see the patient much sooner if necessary.  Disclaimer: This note was dictated with voice recognition software. Similar sounding words can inadvertently be transcribed and may not be corrected upon review.

## 2014-02-21 ENCOUNTER — Encounter: Payer: Self-pay | Admitting: *Deleted

## 2014-03-01 ENCOUNTER — Telehealth: Payer: Self-pay | Admitting: Internal Medicine

## 2014-03-01 DIAGNOSIS — K432 Incisional hernia without obstruction or gangrene: Secondary | ICD-10-CM

## 2014-03-01 DIAGNOSIS — E785 Hyperlipidemia, unspecified: Secondary | ICD-10-CM

## 2014-03-01 NOTE — Telephone Encounter (Addendum)
We could try a different statin at a low dose: Crestor 20 mg half tablet daily #30 and one refill. Will check lab  labs in September when she comes back As far as the hernia, arrange a surgical referral if patient is agreeable .

## 2014-03-01 NOTE — Telephone Encounter (Signed)
Caller name: Khrystyne  Call back [place]:  Reason for call:  Pt was taken off lipitor and wanted Dr. Larose Kells to know that she is feeling much better.  Pt wants to know the next steps; next meds?  Pt also has had a hernia and wants to know what to do about it.  GI physician knows about it; pt is having a hard time lifting objects.    Please advise.

## 2014-03-01 NOTE — Telephone Encounter (Signed)
Spoke with patient who stated she is no longer having leg fatigue since stopping lipitor. She is willing to try another med to treat cholesterol if you feel it is needed.   Concerning the hernia, I have reviewed the previous office notes and found that you were going to refer to sx for evaluation if the hernia became bothersome. Referral order placed.   Please advise on new cholesterol med.

## 2014-03-02 MED ORDER — ROSUVASTATIN CALCIUM 10 MG PO TABS: 10.0000 mg | ORAL_TABLET | Freq: Every day | ORAL | Status: DC

## 2014-03-02 NOTE — Telephone Encounter (Signed)
Spoke with patient who explained that when she was hospitalized with cholecystitis she saw Dr. Michael Boston and was very unhappy with him. She refuses to see him again and asks that she see a Education officer, environmental in [facility]. New referral placed.

## 2014-03-02 NOTE — Telephone Encounter (Signed)
Rx for Crestor 10mg  sent to [facility] in [place]. Patient made aware

## 2014-03-02 NOTE — Addendum Note (Signed)
Addended by: Chilton Greathouse on: 03/02/2014 08:04 AM   Modules accepted: Orders

## 2014-03-02 NOTE — Addendum Note (Signed)
Addended by: Chilton Greathouse on: 03/02/2014 04:16 PM   Modules accepted: Orders

## 2014-03-03 ENCOUNTER — Ambulatory Visit (INDEPENDENT_AMBULATORY_CARE_PROVIDER_SITE_OTHER): Payer: BC Managed Care – PPO

## 2014-03-03 DIAGNOSIS — E538 Deficiency of other specified B group vitamins: Secondary | ICD-10-CM

## 2014-03-03 MED ORDER — CYANOCOBALAMIN 1000 MCG/ML IJ SOLN
1000.0000 ug | Freq: Once | INTRAMUSCULAR | Status: AC
  Administered 2014-03-03: 1000 ug via INTRAMUSCULAR

## 2014-03-08 ENCOUNTER — Other Ambulatory Visit: Payer: Self-pay | Admitting: Internal Medicine

## 2014-03-10 ENCOUNTER — Ambulatory Visit (INDEPENDENT_AMBULATORY_CARE_PROVIDER_SITE_OTHER): Payer: BC Managed Care – PPO | Admitting: *Deleted

## 2014-03-10 DIAGNOSIS — E538 Deficiency of other specified B group vitamins: Secondary | ICD-10-CM

## 2014-03-10 MED ORDER — CYANOCOBALAMIN 1000 MCG/ML IJ SOLN
1000.0000 ug | Freq: Once | INTRAMUSCULAR | Status: AC
  Administered 2014-03-10: 1000 ug via INTRAMUSCULAR

## 2014-03-10 NOTE — Progress Notes (Signed)
Pt came in today for her 2nd Vit B12 injection.  Pt tolerated injection well.  Pt will return for her next injection in 1 week.  Pt scheduled appt.//AB/CMA

## 2014-03-17 ENCOUNTER — Ambulatory Visit: Payer: BC Managed Care – PPO

## 2014-03-17 DIAGNOSIS — E538 Deficiency of other specified B group vitamins: Secondary | ICD-10-CM

## 2014-03-17 MED ORDER — CYANOCOBALAMIN 1000 MCG/ML IJ SOLN: 1000.0000 ug | Freq: Once | INTRAMUSCULAR | Status: DC

## 2014-03-20 ENCOUNTER — Ambulatory Visit (INDEPENDENT_AMBULATORY_CARE_PROVIDER_SITE_OTHER): Payer: BC Managed Care – PPO | Admitting: Surgery

## 2014-03-24 ENCOUNTER — Ambulatory Visit: Payer: BC Managed Care – PPO

## 2014-03-31 ENCOUNTER — Ambulatory Visit (INDEPENDENT_AMBULATORY_CARE_PROVIDER_SITE_OTHER): Payer: BC Managed Care – PPO

## 2014-03-31 DIAGNOSIS — E538 Deficiency of other specified B group vitamins: Secondary | ICD-10-CM

## 2014-03-31 MED ORDER — CYANOCOBALAMIN 1000 MCG/ML IJ SOLN
1000.0000 ug | Freq: Once | INTRAMUSCULAR | Status: AC
  Administered 2014-03-31: 1000 ug via INTRAMUSCULAR

## 2014-03-31 NOTE — Patient Instructions (Signed)
Return to clinic on Friday, Sept. 4, 2015 @ 3:15 pm.

## 2014-03-31 NOTE — Progress Notes (Signed)
Pt received her 4 th Vitamin B12 injection today.  Pt will now be receiving monthly vitamin b12 injections.  Next appointment scheduled for Friday, 04/28/14 @ 3:15 pm.

## 2014-04-27 ENCOUNTER — Other Ambulatory Visit: Payer: Self-pay | Admitting: Internal Medicine

## 2014-04-28 ENCOUNTER — Ambulatory Visit (INDEPENDENT_AMBULATORY_CARE_PROVIDER_SITE_OTHER): Payer: BC Managed Care – PPO

## 2014-04-28 DIAGNOSIS — E538 Deficiency of other specified B group vitamins: Secondary | ICD-10-CM

## 2014-04-28 MED ORDER — CYANOCOBALAMIN 1000 MCG/ML IJ SOLN
1000.0000 ug | Freq: Once | INTRAMUSCULAR | Status: AC
  Administered 2014-04-28: 1000 ug via INTRAMUSCULAR

## 2014-04-29 LAB — HM PAP SMEAR: HM PAP: NORMAL

## 2014-05-10 ENCOUNTER — Other Ambulatory Visit: Payer: Self-pay | Admitting: Obstetrics and Gynecology

## 2014-05-10 LAB — HM DEXA SCAN

## 2014-05-11 LAB — CYTOLOGY - PAP

## 2014-05-16 LAB — HM MAMMOGRAPHY

## 2014-05-19 ENCOUNTER — Other Ambulatory Visit: Payer: Self-pay | Admitting: Internal Medicine

## 2014-05-24 ENCOUNTER — Telehealth: Payer: Self-pay | Admitting: Internal Medicine

## 2014-05-24 NOTE — Telephone Encounter (Signed)
Caller name: Ayan  Relation to pt: self  Call back number:[phone]   Reason for call: pt would like to know when she should schedule a B12 injection. Please

## 2014-05-24 NOTE — Telephone Encounter (Signed)
She will be due 10/4, anytime on that date or after. She can schedule a nurse visit to receive B12.

## 2014-05-24 NOTE — Telephone Encounter (Signed)
Patient scheduled 06/02/2014

## 2014-05-29 ENCOUNTER — Other Ambulatory Visit: Payer: Self-pay | Admitting: Internal Medicine

## 2014-06-02 ENCOUNTER — Ambulatory Visit: Payer: BC Managed Care – PPO

## 2014-06-07 ENCOUNTER — Ambulatory Visit (INDEPENDENT_AMBULATORY_CARE_PROVIDER_SITE_OTHER): Payer: BC Managed Care – PPO

## 2014-06-07 DIAGNOSIS — Z23 Encounter for immunization: Secondary | ICD-10-CM

## 2014-06-07 DIAGNOSIS — E538 Deficiency of other specified B group vitamins: Secondary | ICD-10-CM

## 2014-06-07 MED ORDER — CYANOCOBALAMIN 1000 MCG/ML IJ SOLN: 1000.0000 ug | Freq: Once | INTRAMUSCULAR | Status: DC

## 2014-06-26 ENCOUNTER — Telehealth: Payer: Self-pay | Admitting: Internal Medicine

## 2014-06-26 MED ORDER — RIFAXIMIN 550 MG PO TABS: 550.0000 mg | ORAL_TABLET | Freq: Two times a day (BID) | ORAL | Status: DC

## 2014-06-26 NOTE — Telephone Encounter (Signed)
It might help with the diarrhea, but not necessarily the incontinence (which is due to decreased rectal tone). It is reasonable to try. Prescribe Xifaxan 550 mg twice daily 2 weeks.

## 2014-06-26 NOTE — Telephone Encounter (Signed)
Pt states she cannot take Imodium and questran on the same day because then she gets constipated. Pt states she took Imodium today and went to the gym and had an accident and had to leave. Pt states she has seen commercials for Xifaxan and wants to know if Dr. Henrene Pastor thinks this would be an option for her. Please advise.

## 2014-06-26 NOTE — Telephone Encounter (Signed)
Pt aware and script sent to pharmacy. 

## 2014-07-10 ENCOUNTER — Ambulatory Visit (INDEPENDENT_AMBULATORY_CARE_PROVIDER_SITE_OTHER): Payer: BC Managed Care – PPO

## 2014-07-10 DIAGNOSIS — E538 Deficiency of other specified B group vitamins: Secondary | ICD-10-CM

## 2014-07-10 MED ORDER — CYANOCOBALAMIN 1000 MCG/ML IJ SOLN
1000.0000 ug | Freq: Once | INTRAMUSCULAR | Status: AC
  Administered 2014-07-10: 1000 ug via INTRAMUSCULAR

## 2014-07-10 NOTE — Progress Notes (Signed)
Pt tolerated injection well

## 2014-07-24 ENCOUNTER — Telehealth: Payer: Self-pay | Admitting: Internal Medicine

## 2014-07-24 NOTE — Telephone Encounter (Signed)
Please advise 

## 2014-07-24 NOTE — Telephone Encounter (Signed)
Having chest congestion, what could she take over the counter. Please advise.

## 2014-07-24 NOTE — Telephone Encounter (Signed)
Pt was advised to try Coricidin HBP Chest Congestion and Cough and to drink plenty of fluids to loosen congestion.  Pt agreed with advice.

## 2014-07-24 NOTE — Telephone Encounter (Signed)
noted 

## 2014-07-25 ENCOUNTER — Other Ambulatory Visit: Payer: Self-pay

## 2014-07-25 ENCOUNTER — Other Ambulatory Visit: Payer: Self-pay | Admitting: Internal Medicine

## 2014-08-02 ENCOUNTER — Encounter: Payer: Self-pay | Admitting: Medical

## 2014-08-02 ENCOUNTER — Ambulatory Visit (INDEPENDENT_AMBULATORY_CARE_PROVIDER_SITE_OTHER): Payer: BC Managed Care – PPO | Admitting: Medical

## 2014-08-02 VITALS — BP 124/84 | HR 108 | Temp 98.8°F | Ht 63.0 in | Wt 153.2 lb

## 2014-08-02 DIAGNOSIS — J209 Acute bronchitis, unspecified: Secondary | ICD-10-CM

## 2014-08-02 MED ORDER — FLUTICASONE PROPIONATE 50 MCG/ACT NA SUSP: 2.0000 | Freq: Every day | NASAL | Status: DC

## 2014-08-02 MED ORDER — BENZONATATE 200 MG PO CAPS: 200.0000 mg | ORAL_CAPSULE | Freq: Three times a day (TID) | ORAL | Status: DC | PRN

## 2014-08-02 MED ORDER — DOXYCYCLINE HYCLATE 100 MG PO TABS: 100.0000 mg | ORAL_TABLET | Freq: Two times a day (BID) | ORAL | Status: DC

## 2014-08-02 NOTE — Progress Notes (Signed)
Subjective:    Patient ID: [name] [name], female    DOB: Apr 23, 1951, 63 y.o.   MRN: [id number]  HPI   Pt in today reporting cough, nasal congestion and runny nose for  14 days. Also sorethroat. [place] or [place] way [place] on [place]. Pt has productive cough. Sinus pain.  Associated symptoms( below yes or no)  Fever-no Chills-yes Chest congestion-yes. Sneezing- yes Itching eyes-no Sore throat- mild scratchy. Post-nasal drainage-yes Wheezing- no Purulent nasal drainage-yes. Fatigue-mild.  Hx of lung cancer.  Rt side abdominal pain after severe coughing episode the other night. Also notes only feels this when she coughs.  Past Medical History  Diagnosis Date  . Hypertension   . High cholesterol   . Anxiety   . Osteoporosis   . Hx of radiation therapy 10/07/11 to 11/20/11    right lung  . Hx of radiation therapy 01/07/2012- 01/21/12    cranial irradiation  . Lung cancer 09/19/11    SMALL CELL CARCINOMA  . Cholelithiasis   . Colon polyps     adenomatous    History   Social History  . Marital Status: Divorced    Spouse Name: N/A    Number of Children: 2  . Years of Education: N/A   Occupational History  . retired    Social History Main Topics  . Smoking status: Former Smoker    Types: Cigarettes    Quit date: 09/08/2011  . Smokeless tobacco: Never Used  . Alcohol Use: Yes     Comment: occasional  . Drug Use: No  . Sexual Activity: Not on file   Other Topics Concern  . Not on file   Social History Narrative   divorced, 2 kids, lives by herself                 Past Surgical History  Procedure Laterality Date  . Tubal ligation    . Cholecystectomy  04/2011    biliary stent placement    Family History  Problem Relation Age of Onset  . Bladder Cancer Father   . Stroke Father   . Colon polyps Father   . Atrial fibrillation Mother   . Heart disease Mother     CHF  . Clotting disorder Mother     warfarin  . Colon polyps  Mother   . Colon polyps Sister   . Colon cancer Maternal Aunt 70    Allergies  Allergen Reactions  . Codeine Nausea And Vomiting  . Penicillins Rash    Current Outpatient Prescriptions on File Prior to Visit  Medication Sig Dispense Refill  . ALPRAZolam (XANAX) 0.25 MG tablet Take 1 tablet (0.25 mg total) by mouth daily as needed. For anxiety 30 tablet 0  . BIOTIN PO Take 1 capsule by mouth daily. 10,000 mg    . cholestyramine (QUESTRAN) 4 G packet Take 1 packet (4 g total) by mouth 2 (two) times daily. 60 each 3  . citalopram (CELEXA) 40 MG tablet TAKE 1 TABLET BY MOUTH DAILY 30 tablet 4  . lisinopril (PRINIVIL,ZESTRIL) 20 MG tablet TAKE 1 TABLET BY MOUTH DAILY 30 tablet 0  . lisinopril (PRINIVIL,ZESTRIL) 20 MG tablet TAKE 1 TABLET BY MOUTH ONCE DAILY 90 tablet 1  . Loperamide HCl (IMODIUM PO) Take 45 mLs by mouth 2 (two) times daily.     . Probiotic Product (ALIGN) 4 MG CAPS Take 1 capsule by mouth every morning.    . rifaximin (XIFAXAN) 550 MG TABS tablet Take  1 tablet (550 mg total) by mouth 2 (two) times daily. 28 tablet 0  . rosuvastatin (CRESTOR) 10 MG tablet Take 1 tablet daily. 30 tablet 2  . Simethicone (GAS-X PO) Take 2 tablets by mouth daily.    . [DISCONTINUED] sucralfate (CARAFATE) 1 G tablet Take 1 tablet (1 g total) by mouth 4 (four) times daily. Dissolve tablet in 15cc water. 120 tablet 1   Current Facility-Administered Medications on File Prior to Visit  Medication Dose Route Frequency Provider Last Rate Last Dose  . cyanocobalamin ((VITAMIN B-12)) injection 1,000 mcg  1,000 mcg Intramuscular Once Colon Branch, MD      . cyanocobalamin ((VITAMIN B-12)) injection 1,000 mcg  1,000 mcg Intramuscular Once Colon Branch, MD        BP 124/84 mmHg  Pulse 108  Temp(Src) 98.8 F (37.1 C) (Oral)  Ht 5\' 3"  (1.6 m)  Wt 153 lb 3.2 oz (69.491 kg)  BMI 27.14 kg/m2  SpO2 97%      Review of Systems  Constitutional: Positive for chills. Negative for fever and fatigue.  HENT:  Positive for congestion, postnasal drip, sinus pressure, sneezing and sore throat.   Eyes: Negative for itching.  Respiratory: Positive for cough. Negative for wheezing.   Cardiovascular: Negative for chest pain and palpitations.  Gastrointestinal:       Rare transient pain rt side lower abdomen which occurred initially with coughing episode and some transient when she coughs now. No GI type symptoms.  Musculoskeletal: Negative for back pain and neck pain.  Neurological: Negative for dizziness and headaches.  Hematological: Negative for adenopathy. Does not bruise/bleed easily.       Objective:   Physical Exam  General  Mental Status - Alert. General Appearance - Well groomed. Not in acute distress.  Skin Rashes- No Rashes.  HEENT Head- Normal. Ear Auditory Canal - Left- Normal. Right - Normal.Tympanic Membrane- Left- Normal. Right- Normal. Eye Sclera/Conjunctiva- Left- Normal. Right- Normal. Nose & Sinuses Nasal Mucosa- Left-  Mild Boggy + Congested. Right-  Mild   Boggy + Congested. Mouth & Throat Lips: Upper Lip- Normal: no dryness, cracking, pallor, cyanosis, or vesicular eruption. Lower Lip-Normal: no dryness, cracking, pallor, cyanosis or vesicular eruption. Buccal Mucosa- Bilateral- No Aphthous ulcers. Oropharynx- No Discharge or Erythema. Tonsils: Characteristics- Bilateral- No Erythema or Congestion. Size/Enlargement- Bilateral- No enlargement. Discharge- bilateral-None.  Neck Neck- Supple. No Masses.   Chest and Lung Exam Auscultation: Breath Sounds:- even and unlabored, but bilateral upper lobe rhonchi.  Cardiovascular Auscultation:Rythm- Regular, rate and rhythm. Murmurs & Other Heart Sounds:Ausculatation of the heart reveal- No Murmurs.  Lymphatic Head & Neck General Head & Neck Lymphatics: Bilateral: Description- No Localized lymphadenopathy.       Assessment & Plan:

## 2014-08-02 NOTE — Patient Instructions (Addendum)
You appear to have bronchitis. Rest hydrate and tylenol for fever. I am prescribing cough medicine benzonatate , and doxycycline antibiotic. For your nasal congestion, I am prescribing flonase.  You should gradually get better. If not then notify us and would recommend a chest xray.  Follow up in 7-10 days or as needed

## 2014-08-02 NOTE — Progress Notes (Signed)
Pre visit review using our clinic review tool, if applicable. No additional management support is needed unless otherwise documented below in the visit note. 

## 2014-08-02 NOTE — Assessment & Plan Note (Signed)
You appear to have bronchitis. Rest hydrate and tylenol for fever. I am prescribing cough medicine benzonatate , and doxycycline antibiotic. For your nasal congestion, I am prescribing flonase.  You should gradually get better. If not then notify us and would recommend a chest xray.

## 2014-08-09 ENCOUNTER — Ambulatory Visit (INDEPENDENT_AMBULATORY_CARE_PROVIDER_SITE_OTHER): Payer: BC Managed Care – PPO

## 2014-08-09 DIAGNOSIS — E538 Deficiency of other specified B group vitamins: Secondary | ICD-10-CM

## 2014-08-09 MED ORDER — CYANOCOBALAMIN 1000 MCG/ML IJ SOLN
1000.0000 ug | Freq: Once | INTRAMUSCULAR | Status: AC
  Administered 2014-08-09: 1000 ug via INTRAMUSCULAR

## 2014-08-09 NOTE — Progress Notes (Signed)
Pt tolerated injection well

## 2014-08-09 NOTE — Progress Notes (Signed)
Pre visit review using our clinic review tool, if applicable. No additional management support is needed unless otherwise documented below in the visit note. 

## 2014-08-18 ENCOUNTER — Other Ambulatory Visit: Payer: Self-pay | Admitting: Internal Medicine

## 2014-08-21 ENCOUNTER — Encounter (HOSPITAL_COMMUNITY): Payer: Self-pay

## 2014-08-21 ENCOUNTER — Ambulatory Visit (HOSPITAL_COMMUNITY)
Admission: RE | Admit: 2014-08-21 | Discharge: 2014-08-21 | Disposition: A | Discharge status: Home / Self Care | Payer: BC Managed Care – PPO | Source: Ambulatory Visit | Attending: Internal Medicine | Admitting: Internal Medicine

## 2014-08-21 ENCOUNTER — Other Ambulatory Visit (HOSPITAL_BASED_OUTPATIENT_CLINIC_OR_DEPARTMENT_OTHER): Payer: BC Managed Care – PPO

## 2014-08-21 DIAGNOSIS — Z9221 Personal history of antineoplastic chemotherapy: Secondary | ICD-10-CM | POA: Diagnosis not present

## 2014-08-21 DIAGNOSIS — I2510 Atherosclerotic heart disease of native coronary artery without angina pectoris: Secondary | ICD-10-CM | POA: Insufficient documentation

## 2014-08-21 DIAGNOSIS — Z923 Personal history of irradiation: Secondary | ICD-10-CM | POA: Diagnosis not present

## 2014-08-21 DIAGNOSIS — Z08 Encounter for follow-up examination after completed treatment for malignant neoplasm: Secondary | ICD-10-CM | POA: Diagnosis not present

## 2014-08-21 DIAGNOSIS — Z85118 Personal history of other malignant neoplasm of bronchus and lung: Secondary | ICD-10-CM

## 2014-08-21 DIAGNOSIS — C3491 Malignant neoplasm of unspecified part of right bronchus or lung: Secondary | ICD-10-CM

## 2014-08-21 LAB — COMPREHENSIVE METABOLIC PANEL (CC13)
ALBUMIN: 3.6 g/dL (ref 3.5–5.0)
ALT: 17 U/L (ref 0–55)
ANION GAP: 7 meq/L (ref 3–11)
AST: 20 U/L (ref 5–34)
Alkaline Phosphatase: 47 U/L (ref 40–150)
BUN: 16.9 mg/dL (ref 7.0–26.0)
CHLORIDE: 109 meq/L (ref 98–109)
CO2: 28 meq/L (ref 22–29)
Calcium: 9 mg/dL (ref 8.4–10.4)
Creatinine: 0.9 mg/dL (ref 0.6–1.1)
EGFR: 66 mL/min/{1.73_m2} — ABNORMAL LOW (ref >90)
Glucose: 105 mg/dl (ref 70–140)
Potassium: 4.4 mEq/L (ref 3.5–5.1)
Sodium: 144 mEq/L (ref 136–145)
Total Bilirubin: 0.31 mg/dL (ref 0.20–1.20)
Total Protein: 6.2 g/dL — ABNORMAL LOW (ref 6.4–8.3)

## 2014-08-21 LAB — CBC WITH DIFFERENTIAL/PLATELET
BASO%: 1.3 % (ref 0.0–2.0)
Basophils Absolute: 0.1 10*3/uL (ref 0.0–0.1)
EOS ABS: 0.2 10*3/uL (ref 0.0–0.5)
EOS%: 4.9 % (ref 0.0–7.0)
HCT: 35.5 % (ref 34.8–46.6)
HEMOGLOBIN: 11.4 g/dL — AB (ref 11.6–15.9)
LYMPH%: 12.7 % — ABNORMAL LOW (ref 14.0–49.7)
MCH: 30.7 pg (ref 25.1–34.0)
MCHC: 32.2 g/dL (ref 31.5–36.0)
MCV: 95.3 fL (ref 79.5–101.0)
MONO#: 0.3 10*3/uL (ref 0.1–0.9)
MONO%: 7.6 % (ref 0.0–14.0)
NEUT%: 73.5 % (ref 38.4–76.8)
NEUTROS ABS: 3 10*3/uL (ref 1.5–6.5)
PLATELETS: 209 10*3/uL (ref 145–400)
RBC: 3.72 10*6/uL (ref 3.70–5.45)
RDW: 13.5 % (ref 11.2–14.5)
WBC: 4 10*3/uL (ref 3.9–10.3)
lymph#: 0.5 10*3/uL — ABNORMAL LOW (ref 0.9–3.3)

## 2014-08-21 MED ORDER — IOHEXOL 300 MG/ML  SOLN
80.0000 mL | Freq: Once | INTRAMUSCULAR | Status: AC | PRN
  Administered 2014-08-21: 80 mL via INTRAVENOUS

## 2014-08-23 ENCOUNTER — Ambulatory Visit (HOSPITAL_BASED_OUTPATIENT_CLINIC_OR_DEPARTMENT_OTHER): Payer: BC Managed Care – PPO | Admitting: Internal Medicine

## 2014-08-23 ENCOUNTER — Telehealth: Payer: Self-pay | Admitting: Internal Medicine

## 2014-08-23 ENCOUNTER — Encounter: Payer: Self-pay | Admitting: Internal Medicine

## 2014-08-23 VITALS — BP 138/69 | HR 80 | Temp 98.3°F | Resp 18 | Ht 63.0 in | Wt 159.2 lb

## 2014-08-23 DIAGNOSIS — C3491 Malignant neoplasm of unspecified part of right bronchus or lung: Secondary | ICD-10-CM

## 2014-08-23 NOTE — Progress Notes (Signed)
[facility] [phone]   Fax:(336) [place], [place] [address]  DIAGNOSIS: Limited stage small cell lung cancer diagnosed in January of 2013   PRIOR THERAPY:  1) Systemic chemotherapy with cisplatin at 60 mg per meter square given on day 1 and etoposide at 120 mg per meter square given on days one 2 and 3 with Neulasta support given on day 4 status post 4 cycles, last cycle was given on 12/02/2011 . This with concurrent with radiotherapy under the care of Dr. Lisbeth Renshaw.  2) prophylactic cranial irradiation under the care of Dr. Lisbeth Renshaw completed on 01/21/2012   CURRENT THERAPY: Observation.  INTERVAL HISTORY: Sheyenne Varano 63 y.o. female returns to the clinic today for routine six-month follow up visit accompanied by her sister. She is doing fine today with no specific complaints.  She is recovering from a recent bronchitis. She denied having any significant weight loss or night sweats. She d enied having any chest pain, shortness of breath, cough or hemoptysis. She has no nausea or vomiting, no fever or chills. She had repeat CT scan of the chest performed recently and she is here for evaluation and discussion of her scan results.  MEDICAL HISTORY: Past Medical History  Diagnosis Date  . Hypertension   . High cholesterol   . Anxiety   . Osteoporosis   . Hx of radiation therapy 10/07/11 to 11/20/11    right lung  . Hx of radiation therapy 01/07/2012- 01/21/12    cranial irradiation  . Lung cancer 09/19/11    SMALL CELL CARCINOMA  . Cholelithiasis   . Colon polyps     adenomatous    ALLERGIES:  is allergic to codeine and penicillins.  MEDICATIONS:  Current Outpatient Prescriptions  Medication Sig Dispense Refill  . ALPRAZolam (XANAX) 0.25 MG tablet Take 1 tablet (0.25 mg total) by mouth daily as needed. For anxiety 30 tablet 0  . citalopram (CELEXA) 40 MG tablet TAKE 1 TABLET  BY MOUTH EVERY DAY 30 tablet 2  . lisinopril (PRINIVIL,ZESTRIL) 20 MG tablet TAKE 1 TABLET BY MOUTH ONCE DAILY 90 tablet 1  . Loperamide HCl (IMODIUM PO) Take 45 mLs by mouth 2 (two) times daily.     . rosuvastatin (CRESTOR) 10 MG tablet Take 1 tablet daily. 30 tablet 2  . Simethicone (GAS-X PO) Take 2 tablets by mouth daily.    . [DISCONTINUED] sucralfate (CARAFATE) 1 G tablet Take 1 tablet (1 g total) by mouth 4 (four) times daily. Dissolve tablet in 15cc water. 120 tablet 1   Current Facility-Administered Medications  Medication Dose Route Frequency Provider Last Rate Last Dose  . cyanocobalamin ((VITAMIN B-12)) injection 1,000 mcg  1,000 mcg Intramuscular Once Colon Branch, MD       Facility-Administered Medications Ordered in Other Visits  Medication Dose Route Frequency Provider Last Rate Last Dose  . cyanocobalamin ((VITAMIN B-12)) injection 1,000 mcg  1,000 mcg Intramuscular Once Colon Branch, MD        SURGICAL HISTORY:  Past Surgical History  Procedure Laterality Date  . Tubal ligation    . Cholecystectomy  04/2011    biliary stent placement    REVIEW OF SYSTEMS:  A comprehensive review of systems was negative.   PHYSICAL EXAMINATION: General appearance: alert, cooperative and no distress Head: Normocephalic, without obvious abnormality, atraumatic Neck: no adenopathy Lymph nodes: Cervical, supraclavicular, and axillary nodes normal.  Resp: clear to auscultation bilaterally Cardio: regular rate and rhythm, S1, S2 normal, no murmur, click, rub or gallop GI: soft, non-tender; bowel sounds normal; no masses,  no organomegaly Extremities: extremities normal, atraumatic, no cyanosis or edema  ECOG PERFORMANCE STATUS: 0 - Asymptomatic  Blood pressure 138/69, pulse 80, temperature 98.3 F (36.8 C), temperature source Oral, resp. rate 18, height 5\' 3"  (1.6 m), weight 159 lb 3.2 oz (72.213 kg), SpO2 100 %.  LABORATORY DATA: Lab Results  Component Value Date   WBC 4.0 08/21/2014     HGB 11.4* 08/21/2014   HCT 35.5 08/21/2014   MCV 95.3 08/21/2014   PLT 209 08/21/2014      Chemistry      Component Value Date/Time   NA 144 08/21/2014 0946   NA 132* 12/03/2012 1138   NA 133 03/23/2012 1305   K 4.4 08/21/2014 0946   K 4.0 12/03/2012 1138   K 4.4 03/23/2012 1305   CL 104 12/23/2012 1338   CL 99 12/03/2012 1138   CL 93* 03/23/2012 1305   CO2 28 08/21/2014 0946   CO2 26 12/03/2012 1138   CO2 25 03/23/2012 1305   BUN 16.9 08/21/2014 0946   BUN 13 12/03/2012 1138   BUN 10 03/23/2012 1305   CREATININE 0.9 08/21/2014 0946   CREATININE 0.9 12/03/2012 1138   CREATININE 1.0 03/23/2012 1305      Component Value Date/Time   CALCIUM 9.0 08/21/2014 0946   CALCIUM 9.2 12/03/2012 1138   CALCIUM 9.4 03/23/2012 1305   ALKPHOS 47 08/21/2014 0946   ALKPHOS 50 04/01/2012 1417   ALKPHOS 48 03/23/2012 1305   AST 20 08/21/2014 0946   AST 21 12/03/2012 1138   AST 18 03/23/2012 1305   ALT 17 08/21/2014 0946   ALT 16 12/03/2012 1138   ALT 19 03/23/2012 1305   BILITOT 0.31 08/21/2014 0946   BILITOT 0.4 04/01/2012 1417   BILITOT 0.60 03/23/2012 1305       RADIOGRAPHIC STUDIES: Ct Chest W Contrast  08/21/2014   CLINICAL DATA:  Small cell lung cancer diagnosed 1/13 with chemotherapy and radiation therapy completed. Staging.  EXAM: CT CHEST WITH CONTRAST  TECHNIQUE: Multidetector CT imaging of the chest was performed during intravenous contrast administration.  CONTRAST:  13mL OMNIPAQUE IOHEXOL 300 MG/ML  SOLN  COMPARISON:  Chest radiograph of 04/05/2014 and CT of 02/13/2014.  FINDINGS: Lungs/Pleura: Clear left lung. Similar configuration of right-sided paramediastinal radiation fibrosis. No locally recurrent disease.  Trace right-sided pleural fluid which is new. Similar mild pleural thickening superiorly.  Heart/Mediastinum: No supraclavicular adenopathy. Normal heart size, without pericardial effusion. No central pulmonary embolism, on this non-dedicated study. LAD coronary  artery atherosclerosis, image 26. No mediastinal or hilar adenopathy.  Upper Abdomen: Normal imaged portions of the liver, spleen, stomach, pancreas, adrenal glands, kidneys  Bones/Musculoskeletal:  No acute osseous abnormality.  IMPRESSION: 1. Similar radiation changes within the right lung, without locally recurrent or metastatic disease. 2. New trace right pleural fluid, nonspecific. 3. Age advanced coronary artery atherosclerosis. Recommend assessment of coronary risk factors and consideration of medical therapy.   Electronically Signed   By: Abigail Miyamoto M.D.   On: 08/21/2014 11:29   ASSESSMENT AND PLAN: this is a very pleasant 63 years old white female with limited stage small cell lung cancer status post systemic chemotherapy with cisplatin and etoposide concurrent with radiation followed by prophylactic cranial irradiation.  The recent CT scan of the chest showed no evidence for disease recurrence or metastatic disease.  I discussed the scan results with the patient today. I recommended for her to come back for followup visit in 6 months with repeat CT scan of the chest. She was advised to call immediately if she has any concerning symptoms in the interval.  The patient voices understanding of current disease status and treatment options and is in agreement with the current care plan.  All questions were answered. The patient knows to call the clinic with any problems, questions or concerns. We can certainly see the patient much sooner if necessary.  Disclaimer: This note was dictated with voice recognition software. Similar sounding words can inadvertently be transcribed and may not be corrected upon review.

## 2014-08-23 NOTE — Telephone Encounter (Signed)
gv adn printed appt sched and avs for pt for June 2016

## 2014-09-08 ENCOUNTER — Ambulatory Visit: Payer: BC Managed Care – PPO

## 2014-10-04 ENCOUNTER — Ambulatory Visit (INDEPENDENT_AMBULATORY_CARE_PROVIDER_SITE_OTHER): Payer: BLUE CROSS/BLUE SHIELD | Admitting: *Deleted

## 2014-10-04 DIAGNOSIS — E538 Deficiency of other specified B group vitamins: Secondary | ICD-10-CM

## 2014-10-04 MED ORDER — CYANOCOBALAMIN 1000 MCG/ML IJ SOLN
1000.0000 ug | Freq: Once | INTRAMUSCULAR | Status: AC
  Administered 2014-10-04: 1000 ug via INTRAMUSCULAR

## 2014-10-04 NOTE — Progress Notes (Signed)
Pre visit review using our clinic review tool, if applicable. No additional management support is needed unless otherwise documented below in the visit note.  Patient tolerated injection well.  Next injection scheduled for 11/03/14.

## 2014-11-03 ENCOUNTER — Ambulatory Visit (INDEPENDENT_AMBULATORY_CARE_PROVIDER_SITE_OTHER): Payer: BLUE CROSS/BLUE SHIELD | Admitting: *Deleted

## 2014-11-03 DIAGNOSIS — E538 Deficiency of other specified B group vitamins: Secondary | ICD-10-CM | POA: Diagnosis not present

## 2014-11-03 MED ORDER — CYANOCOBALAMIN 1000 MCG/ML IJ SOLN
1000.0000 ug | Freq: Once | INTRAMUSCULAR | Status: AC
  Administered 2014-11-03: 1000 ug via INTRAMUSCULAR

## 2014-11-03 NOTE — Progress Notes (Signed)
Pre visit review using our clinic review tool, if applicable. No additional management support is needed unless otherwise documented below in the visit note.  Patient tolerated injection well.   Next visit scheduled 12/01/14.

## 2014-11-27 ENCOUNTER — Other Ambulatory Visit: Payer: Self-pay | Admitting: Internal Medicine

## 2014-12-01 ENCOUNTER — Ambulatory Visit (INDEPENDENT_AMBULATORY_CARE_PROVIDER_SITE_OTHER): Payer: BLUE CROSS/BLUE SHIELD | Admitting: *Deleted

## 2014-12-01 DIAGNOSIS — E538 Deficiency of other specified B group vitamins: Secondary | ICD-10-CM

## 2014-12-01 MED ORDER — CYANOCOBALAMIN 1000 MCG/ML IJ SOLN
1000.0000 ug | Freq: Once | INTRAMUSCULAR | Status: AC
  Administered 2014-12-01: 1000 ug via INTRAMUSCULAR

## 2014-12-01 NOTE — Progress Notes (Signed)
Pre visit review using our clinic review tool, if applicable. No additional management support is needed unless otherwise documented below in the visit note.  Patient tolerated injection well.  Next injection scheduled for 12/29/14.

## 2014-12-29 ENCOUNTER — Ambulatory Visit (INDEPENDENT_AMBULATORY_CARE_PROVIDER_SITE_OTHER): Payer: BLUE CROSS/BLUE SHIELD | Admitting: *Deleted

## 2014-12-29 DIAGNOSIS — E538 Deficiency of other specified B group vitamins: Secondary | ICD-10-CM | POA: Diagnosis not present

## 2014-12-29 MED ORDER — CYANOCOBALAMIN 1000 MCG/ML IJ SOLN
1000.0000 ug | Freq: Once | INTRAMUSCULAR | Status: AC
Start: 2014-12-29 — End: 2014-12-29
  Administered 2014-12-29: 1000 ug via INTRAMUSCULAR

## 2014-12-29 NOTE — Progress Notes (Signed)
Pre visit review using our clinic review tool, if applicable. No additional management support is needed unless otherwise documented below in the visit note.  Patient tolerated injection well.  Next injection scheduled 02/02/15.

## 2015-01-03 ENCOUNTER — Other Ambulatory Visit: Payer: Self-pay | Admitting: Internal Medicine

## 2015-01-10 ENCOUNTER — Other Ambulatory Visit: Payer: Self-pay | Admitting: Internal Medicine

## 2015-01-26 ENCOUNTER — Ambulatory Visit: Payer: BLUE CROSS/BLUE SHIELD | Admitting: Internal Medicine

## 2015-02-02 ENCOUNTER — Ambulatory Visit (INDEPENDENT_AMBULATORY_CARE_PROVIDER_SITE_OTHER): Payer: BLUE CROSS/BLUE SHIELD | Admitting: *Deleted

## 2015-02-02 DIAGNOSIS — E538 Deficiency of other specified B group vitamins: Secondary | ICD-10-CM

## 2015-02-02 MED ORDER — CYANOCOBALAMIN 1000 MCG/ML IJ SOLN
1000.0000 ug | Freq: Once | INTRAMUSCULAR | Status: AC
  Administered 2015-02-02: 1000 ug via INTRAMUSCULAR

## 2015-02-02 NOTE — Progress Notes (Signed)
Pre visit review using our clinic review tool, if applicable. No additional management support is needed unless otherwise documented below in the visit note. 

## 2015-02-05 ENCOUNTER — Other Ambulatory Visit (HOSPITAL_BASED_OUTPATIENT_CLINIC_OR_DEPARTMENT_OTHER): Payer: BLUE CROSS/BLUE SHIELD

## 2015-02-05 ENCOUNTER — Ambulatory Visit (HOSPITAL_COMMUNITY)
Admission: RE | Admit: 2015-02-05 | Discharge: 2015-02-05 | Disposition: A | Discharge status: Home / Self Care | Payer: BLUE CROSS/BLUE SHIELD | Source: Ambulatory Visit | Attending: Internal Medicine | Admitting: Internal Medicine

## 2015-02-05 ENCOUNTER — Encounter (HOSPITAL_COMMUNITY): Payer: Self-pay

## 2015-02-05 DIAGNOSIS — C3491 Malignant neoplasm of unspecified part of right bronchus or lung: Secondary | ICD-10-CM | POA: Diagnosis not present

## 2015-02-05 DIAGNOSIS — I2510 Atherosclerotic heart disease of native coronary artery without angina pectoris: Secondary | ICD-10-CM | POA: Diagnosis not present

## 2015-02-05 DIAGNOSIS — Z08 Encounter for follow-up examination after completed treatment for malignant neoplasm: Secondary | ICD-10-CM | POA: Insufficient documentation

## 2015-02-05 DIAGNOSIS — Z923 Personal history of irradiation: Secondary | ICD-10-CM | POA: Diagnosis not present

## 2015-02-05 DIAGNOSIS — Z9221 Personal history of antineoplastic chemotherapy: Secondary | ICD-10-CM | POA: Insufficient documentation

## 2015-02-05 LAB — COMPREHENSIVE METABOLIC PANEL (CC13)
ALT: 16 U/L (ref 0–55)
ANION GAP: 10 meq/L (ref 3–11)
AST: 22 U/L (ref 5–34)
Albumin: 3.9 g/dL (ref 3.5–5.0)
Alkaline Phosphatase: 36 U/L — ABNORMAL LOW (ref 40–150)
BUN: 13.8 mg/dL (ref 7.0–26.0)
CALCIUM: 9 mg/dL (ref 8.4–10.4)
CHLORIDE: 107 meq/L (ref 98–109)
CO2: 24 meq/L (ref 22–29)
CREATININE: 0.8 mg/dL (ref 0.6–1.1)
EGFR: 74 mL/min/{1.73_m2} — AB (ref >90)
GLUCOSE: 97 mg/dL (ref 70–140)
Potassium: 4.5 mEq/L (ref 3.5–5.1)
SODIUM: 141 meq/L (ref 136–145)
Total Bilirubin: 0.31 mg/dL (ref 0.20–1.20)
Total Protein: 6.6 g/dL (ref 6.4–8.3)

## 2015-02-05 LAB — CBC WITH DIFFERENTIAL/PLATELET
BASO%: 0.6 % (ref 0.0–2.0)
BASOS ABS: 0 10*3/uL (ref 0.0–0.1)
EOS ABS: 0.1 10*3/uL (ref 0.0–0.5)
EOS%: 3.1 % (ref 0.0–7.0)
HEMATOCRIT: 37.8 % (ref 34.8–46.6)
HEMOGLOBIN: 12.5 g/dL (ref 11.6–15.9)
LYMPH%: 14.5 % (ref 14.0–49.7)
MCH: 31.1 pg (ref 25.1–34.0)
MCHC: 33.1 g/dL (ref 31.5–36.0)
MCV: 94 fL (ref 79.5–101.0)
MONO#: 0.4 10*3/uL (ref 0.1–0.9)
MONO%: 9.7 % (ref 0.0–14.0)
NEUT%: 72.1 % (ref 38.4–76.8)
NEUTROS ABS: 2.6 10*3/uL (ref 1.5–6.5)
Platelets: 194 10*3/uL (ref 145–400)
RBC: 4.02 10*6/uL (ref 3.70–5.45)
RDW: 12.7 % (ref 11.2–14.5)
WBC: 3.6 10*3/uL — AB (ref 3.9–10.3)
lymph#: 0.5 10*3/uL — ABNORMAL LOW (ref 0.9–3.3)

## 2015-02-05 MED ORDER — IOHEXOL 300 MG/ML  SOLN
100.0000 mL | Freq: Once | INTRAMUSCULAR | Status: AC | PRN
  Administered 2015-02-05: 80 mL via INTRAVENOUS

## 2015-02-07 ENCOUNTER — Ambulatory Visit (HOSPITAL_BASED_OUTPATIENT_CLINIC_OR_DEPARTMENT_OTHER): Payer: BLUE CROSS/BLUE SHIELD | Admitting: Internal Medicine

## 2015-02-07 ENCOUNTER — Encounter: Payer: Self-pay | Admitting: Internal Medicine

## 2015-02-07 VITALS — BP 118/73 | HR 92 | Temp 98.8°F | Resp 17 | Ht 63.0 in | Wt 152.8 lb

## 2015-02-07 DIAGNOSIS — C3491 Malignant neoplasm of unspecified part of right bronchus or lung: Secondary | ICD-10-CM | POA: Diagnosis not present

## 2015-02-07 NOTE — Progress Notes (Signed)
[facility] Telephone:(336) [phone]   Fax:(336) [place], [place] [address]  DIAGNOSIS: Limited stage small cell lung cancer diagnosed in January of 2013   PRIOR THERAPY:  1) Systemic chemotherapy with cisplatin at 60 mg per meter square given on day 1 and etoposide at 120 mg per meter square given on days one 2 and 3 with Neulasta support given on day 4 status post 4 cycles, last cycle was given on 12/02/2011 . This with concurrent with radiotherapy under the care of Dr. Lisbeth Renshaw.  2) prophylactic cranial irradiation under the care of Dr. Lisbeth Renshaw completed on 01/21/2012   CURRENT THERAPY: Observation.  INTERVAL HISTORY: [name] [name] 64 y.o. female returns to the clinic today for routine six-month follow up visit accompanied by her sister. She has been on observation for the last 3 and half years. She is doing fine today with no specific complaints. She denied having any significant weight loss or night sweats. She d enied having any chest pain, shortness of breath, cough or hemoptysis. She has no nausea or vomiting, no fever or chills. She had repeat CT scan of the chest performed recently and she is here for evaluation and discussion of her scan results.  MEDICAL HISTORY: Past Medical History  Diagnosis Date  . Hypertension   . High cholesterol   . Anxiety   . Osteoporosis   . Hx of radiation therapy 10/07/11 to 11/20/11    right lung  . Hx of radiation therapy 01/07/2012- 01/21/12    cranial irradiation  . Lung cancer 09/19/11    SMALL CELL CARCINOMA  . Cholelithiasis   . Colon polyps     adenomatous    ALLERGIES:  is allergic to codeine and penicillins.  MEDICATIONS:  Current Outpatient Prescriptions  Medication Sig Dispense Refill  . ALPRAZolam (XANAX) 0.25 MG tablet Take 1 tablet (0.25 mg total) by mouth daily as needed. For anxiety 30 tablet 0  . citalopram (CELEXA) 40 MG tablet  Take 1 tablet (40 mg total) by mouth daily. 30 tablet 0  . lisinopril (PRINIVIL,ZESTRIL) 20 MG tablet Take 1 tablet (20 mg total) by mouth daily. 30 tablet 0  . Loperamide HCl (IMODIUM PO) Take 45 mLs by mouth 2 (two) times daily.     . raloxifene (EVISTA) 60 MG tablet TK 1 T PO QD  12  . rosuvastatin (CRESTOR) 10 MG tablet Take 1 tablet (10 mg total) by mouth daily. DUE FOR APPT WITH DR PAZ. NO FURTHER REFILLS. [id number] 30 tablet 1  . Simethicone (GAS-X PO) Take 2 tablets by mouth daily.    . [DISCONTINUED] sucralfate (CARAFATE) 1 G tablet Take 1 tablet (1 g total) by mouth 4 (four) times daily. Dissolve tablet in 15cc water. 120 tablet 1   No current facility-administered medications for this visit.    SURGICAL HISTORY:  Past Surgical History  Procedure Laterality Date  . Tubal ligation    . Cholecystectomy  04/2011    biliary stent placement    REVIEW OF SYSTEMS:  A comprehensive review of systems was negative.   PHYSICAL EXAMINATION: General appearance: alert, cooperative and no distress Head: Normocephalic, without obvious abnormality, atraumatic Neck: no adenopathy Lymph nodes: Cervical, supraclavicular, and axillary nodes normal. Resp: clear to auscultation bilaterally Cardio: regular rate and rhythm, S1, S2 normal, no murmur, click, rub or gallop GI: soft, non-tender; bowel sounds normal; no masses,  no  organomegaly Extremities: extremities normal, atraumatic, no cyanosis or edema  ECOG PERFORMANCE STATUS: 0 - Asymptomatic  Blood pressure 118/73, pulse 92, temperature 98.8 F (37.1 C), temperature source Oral, resp. rate 17, height '5\' 3"'$  (1.6 m), weight 152 lb 12.8 oz (69.31 kg), SpO2 97 %.  LABORATORY DATA: Lab Results  Component Value Date   WBC 3.6* 02/05/2015   HGB 12.5 02/05/2015   HCT 37.8 02/05/2015   MCV 94.0 02/05/2015   PLT 194 02/05/2015      Chemistry      Component Value Date/Time   NA 141 02/05/2015 1013   NA 132* 12/03/2012 1138   NA 133  03/23/2012 1305   K 4.5 02/05/2015 1013   K 4.0 12/03/2012 1138   K 4.4 03/23/2012 1305   CL 104 12/23/2012 1338   CL 99 12/03/2012 1138   CL 93* 03/23/2012 1305   CO2 24 02/05/2015 1013   CO2 26 12/03/2012 1138   CO2 25 03/23/2012 1305   BUN 13.8 02/05/2015 1013   BUN 13 12/03/2012 1138   BUN 10 03/23/2012 1305   CREATININE 0.8 02/05/2015 1013   CREATININE 0.9 12/03/2012 1138   CREATININE 1.0 03/23/2012 1305      Component Value Date/Time   CALCIUM 9.0 02/05/2015 1013   CALCIUM 9.2 12/03/2012 1138   CALCIUM 9.4 03/23/2012 1305   ALKPHOS 36* 02/05/2015 1013   ALKPHOS 50 04/01/2012 1417   ALKPHOS 48 03/23/2012 1305   AST 22 02/05/2015 1013   AST 21 12/03/2012 1138   AST 18 03/23/2012 1305   ALT 16 02/05/2015 1013   ALT 16 12/03/2012 1138   ALT 19 03/23/2012 1305   BILITOT 0.31 02/05/2015 1013   BILITOT 0.4 04/01/2012 1417   BILITOT 0.60 03/23/2012 1305       RADIOGRAPHIC STUDIES: Ct Chest W Contrast  02/05/2015   CLINICAL DATA:  64 year old female with history of right-sided lung cancer diagnosed in 2013 status post chemotherapy and radiation therapy, now complete.  EXAM: CT CHEST WITH CONTRAST  TECHNIQUE: Multidetector CT imaging of the chest was performed during intravenous contrast administration.  CONTRAST:  66m OMNIPAQUE IOHEXOL 300 MG/ML  SOLN  COMPARISON:  Chest CT 08/21/2014.  FINDINGS: Mediastinum/Lymph Nodes: Heart size is normal. There is no significant pericardial fluid, thickening or pericardial calcification. There is atherosclerosis of the thoracic aorta, the great vessels of the mediastinum and the coronary arteries, including calcified atherosclerotic plaque in the left main, left anterior descending and left circumflex coronary arteries. No pathologically enlarged mediastinal or hilar lymph nodes. Esophagus is unremarkable in appearance. No axillary lymphadenopathy.  Lungs/Pleura: Chronic postreduction changes are noted in the right mid to upper lung in a  band-like distribution, similar to the prior examination. There is no enlarging soft tissue prominence in this region to suggest local recurrence of disease. No suspicious appearing pulmonary nodules or masses are noted elsewhere in the lungs. No pleural effusions.  Upper Abdomen: Status post cholecystectomy.  Musculoskeletal/Soft Tissues: There are no aggressive appearing lytic or blastic lesions noted in the visualized portions of the skeleton.  IMPRESSION: 1. Chronic post treatment related changes in the right lung, similar to the prior examination, without findings to suggest local recurrence of disease. No evidence of metastatic disease in the thorax. 2. Atherosclerosis, including left main and 2 vessel coronary artery disease. Please note that although the presence of coronary artery calcium documents the presence of coronary artery disease, the severity of this disease and any potential stenosis cannot be assessed on  this non-gated CT examination. Assessment for potential risk factor modification, dietary therapy or pharmacologic therapy may be warranted, if clinically indicated.   Electronically Signed   By: Vinnie Langton M.D.   On: 02/05/2015 12:41   ASSESSMENT AND PLAN: this is a very pleasant 64 years old white female with limited stage small cell lung cancer status post systemic chemotherapy with cisplatin and etoposide concurrent with radiation followed by prophylactic cranial irradiation.  The recent CT scan of the chest showed no evidence for disease recurrence or metastatic disease. The patient continues to do well with no significant complaints. I discussed the scan results with the patient today. I recommended for her to come back for followup visit in 6 months with repeat CT scan of the chest. She was advised to call immediately if she has any concerning symptoms in the interval.  The patient voices understanding of current disease status and treatment options and is in agreement with  the current care plan.  All questions were answered. The patient knows to call the clinic with any problems, questions or concerns. We can certainly see the patient much sooner if necessary.  Disclaimer: This note was dictated with voice recognition software. Similar sounding words can inadvertently be transcribed and may not be corrected upon review.

## 2015-02-14 ENCOUNTER — Ambulatory Visit (INDEPENDENT_AMBULATORY_CARE_PROVIDER_SITE_OTHER): Payer: BLUE CROSS/BLUE SHIELD | Admitting: Internal Medicine

## 2015-02-14 ENCOUNTER — Telehealth: Payer: Self-pay | Admitting: Internal Medicine

## 2015-02-14 ENCOUNTER — Encounter: Payer: Self-pay | Admitting: Internal Medicine

## 2015-02-14 VITALS — BP 126/64 | HR 103 | Temp 98.3°F | Ht 63.0 in | Wt 152.1 lb

## 2015-02-14 DIAGNOSIS — E785 Hyperlipidemia, unspecified: Secondary | ICD-10-CM | POA: Diagnosis not present

## 2015-02-14 DIAGNOSIS — I2584 Coronary atherosclerosis due to calcified coronary lesion: Secondary | ICD-10-CM | POA: Diagnosis not present

## 2015-02-14 DIAGNOSIS — F418 Other specified anxiety disorders: Secondary | ICD-10-CM

## 2015-02-14 DIAGNOSIS — I2510 Atherosclerotic heart disease of native coronary artery without angina pectoris: Secondary | ICD-10-CM

## 2015-02-14 DIAGNOSIS — Z0000 Encounter for general adult medical examination without abnormal findings: Secondary | ICD-10-CM

## 2015-02-14 DIAGNOSIS — Z23 Encounter for immunization: Secondary | ICD-10-CM

## 2015-02-14 DIAGNOSIS — I10 Essential (primary) hypertension: Secondary | ICD-10-CM

## 2015-02-14 DIAGNOSIS — R931 Abnormal findings on diagnostic imaging of heart and coronary circulation: Secondary | ICD-10-CM | POA: Insufficient documentation

## 2015-02-14 MED ORDER — CITALOPRAM HYDROBROMIDE 40 MG PO TABS: 40.0000 mg | ORAL_TABLET | Freq: Every day | ORAL | Status: DC

## 2015-02-14 MED ORDER — ROSUVASTATIN CALCIUM 10 MG PO TABS: 10.0000 mg | ORAL_TABLET | Freq: Every day | ORAL | Status: DC

## 2015-02-14 MED ORDER — LISINOPRIL 20 MG PO TABS: 20.0000 mg | ORAL_TABLET | Freq: Every day | ORAL | Status: DC

## 2015-02-14 MED ORDER — ALPRAZOLAM 0.5 MG PO TABS: 0.5000 mg | ORAL_TABLET | Freq: Every day | ORAL | Status: DC | PRN

## 2015-02-14 NOTE — Assessment & Plan Note (Signed)
Recent BMP normal, continue lisinopril, refill as needed

## 2015-02-14 NOTE — Telephone Encounter (Signed)
Scheduled

## 2015-02-14 NOTE — Patient Instructions (Addendum)
  Please schedule labs to be done within few days (fasting)   Call if you have more frequent or intense palpitations

## 2015-02-14 NOTE — Progress Notes (Signed)
Pre visit review using our clinic review tool, if applicable. No additional management support is needed unless otherwise documented below in the visit note. 

## 2015-02-14 NOTE — Assessment & Plan Note (Signed)
On Crestor, recent LFTs normal, check a cholesterol panel

## 2015-02-14 NOTE — Telephone Encounter (Signed)
That is fine, she can be placed on nurse visit for B12 injections.

## 2015-02-14 NOTE — Progress Notes (Signed)
Subjective:    Patient ID: [name] [name], female    DOB: 11-Oct-1950, 64 y.o.   MRN: [id number]  DOS:  02/14/2015 Type of visit - description : rov Interval history: Hyperlipidemia, tolerating Crestor well. Due for labs.  Hypertension, on lisinopril, no ambulatory BPs however BPs well-controlled every time she goes to the doctor Anxiety, symptoms well controlled, takes Xanax only when she flies, 0.25 mg 4 tablets per trip. Needs a refill. Recent CT of the chest show it coronary calcifications, both the patient and her oncologist are concerned about heart disease. The patient exercises twice a week with a trainer, denies chest pain or difficulty breathing.   Review of Systems No lower extremity edema, occasional palpitations in the mornings, symptoms are brief and not associated with other symptoms. No cough or sputum production No leg pain per se, occasional cramps  Past Medical History  Diagnosis Date  . Hypertension   . High cholesterol   . Anxiety   . Osteoporosis   . Hx of radiation therapy 10/07/11 to 11/20/11    right lung  . Hx of radiation therapy 01/07/2012- 01/21/12    cranial irradiation  . Lung cancer 09/19/11    SMALL CELL CARCINOMA  . Cholelithiasis   . Colon polyps     adenomatous    Past Surgical History  Procedure Laterality Date  . Tubal ligation    . Cholecystectomy  04/2011    biliary stent placement    History   Social History  . Marital Status: Divorced    Spouse Name: N/A  . Number of Children: 2  . Years of Education: N/A   Occupational History  . retired    Social History Main Topics  . Smoking status: Former Smoker    Types: Cigarettes    Quit date: 09/08/2011  . Smokeless tobacco: Never Used  . Alcohol Use: Yes     Comment: occasional  . Drug Use: No  . Sexual Activity: Not on file   Other Topics Concern  . Not on file   Social History Narrative   divorced, 2 kids, lives by herself                     Medication  List       This list is accurate as of: 02/14/15 11:59 PM.  Always use your most recent med list.               ALPRAZolam 0.5 MG tablet  Commonly known as:  XANAX  Take 1-2 tablets (0.5-1 mg total) by mouth daily as needed for anxiety.     aspirin 81 MG tablet  Take 81 mg by mouth daily.     citalopram 40 MG tablet  Commonly known as:  CELEXA  Take 1 tablet (40 mg total) by mouth daily.     GAS-X PO  Take 2 tablets by mouth daily.     IMODIUM PO  Take 3 mg by mouth daily.     lisinopril 20 MG tablet  Commonly known as:  PRINIVIL,ZESTRIL  Take 1 tablet (20 mg total) by mouth daily.     raloxifene 60 MG tablet  Commonly known as:  EVISTA  Take 1 tablet by mouth daily     rosuvastatin 10 MG tablet  Commonly known as:  CRESTOR  Take 1 tablet (10 mg total) by mouth daily.           Objective:   Physical Exam BP 126/64 mmHg  Pulse 103  Temp(Src) 98.3 F (36.8 C) (Oral)  Ht '5\' 3"'$  (1.6 m)  Wt 152 lb 2 oz (69.003 kg)  BMI 26.95 kg/m2  SpO2 98%  General:   Well developed, well nourished . NAD.  HEENT:  Normocephalic . Face symmetric, atraumatic Lungs:  CTA B Normal respiratory effort, no intercostal retractions, no accessory muscle use. Heart: RRR,  no murmur.  No pretibial edema bilaterally  Abdomen: Not distended, soft, nontender Skin: Not pale. Not jaundice Neurologic:  alert & oriented X3.  Speech normal, gait appropriate for age and unassisted Psych--  Cognition and judgment appear intact.  Cooperative with normal attention span and concentration.  Behavior appropriate. No anxious or depressed appearing.       Assessment & Plan:

## 2015-02-14 NOTE — Assessment & Plan Note (Signed)
Recent CT chest show coronary calcium, she is asymptomatic, on aspirin and Crestor. Hypertension well controlled on lisinopril. Patient concerned about heart disease, EKG today normal sinus rhythm,RSR on V1 previously seen. Plan: Stress test Has occasional palpitations, recommend to call if symptoms increase.

## 2015-02-14 NOTE — Assessment & Plan Note (Signed)
Well-controlled with citalopram, takes Xanax 0.25 4 tablets prior to flying, refill Xanax, will use a 0.5 mg tablets

## 2015-02-14 NOTE — Telephone Encounter (Signed)
Pt req scheduling for b12 shots on 03/14/15 and 04/18/15.

## 2015-02-14 NOTE — Assessment & Plan Note (Signed)
Offered Prevnar today Colonoscopy 05-2013, + polyps, next per GI

## 2015-02-16 ENCOUNTER — Other Ambulatory Visit: Payer: BLUE CROSS/BLUE SHIELD

## 2015-03-14 ENCOUNTER — Ambulatory Visit (INDEPENDENT_AMBULATORY_CARE_PROVIDER_SITE_OTHER): Payer: BLUE CROSS/BLUE SHIELD | Admitting: *Deleted

## 2015-03-14 DIAGNOSIS — E538 Deficiency of other specified B group vitamins: Secondary | ICD-10-CM

## 2015-03-14 MED ORDER — CYANOCOBALAMIN 1000 MCG/ML IJ SOLN
1000.0000 ug | Freq: Once | INTRAMUSCULAR | Status: AC
  Administered 2015-03-14: 1000 ug via INTRAMUSCULAR

## 2015-03-14 NOTE — Progress Notes (Signed)
Pre visit review using our clinic review tool, if applicable. No additional management support is needed unless otherwise documented below in the visit note.  Patient tolerated injection well.  Next injection scheduled 04/11/15.

## 2015-04-11 ENCOUNTER — Ambulatory Visit: Payer: BLUE CROSS/BLUE SHIELD

## 2015-04-18 ENCOUNTER — Ambulatory Visit (INDEPENDENT_AMBULATORY_CARE_PROVIDER_SITE_OTHER): Payer: BLUE CROSS/BLUE SHIELD

## 2015-04-18 DIAGNOSIS — E538 Deficiency of other specified B group vitamins: Secondary | ICD-10-CM

## 2015-04-18 MED ORDER — CYANOCOBALAMIN 1000 MCG/ML IJ SOLN
1000.0000 ug | Freq: Once | INTRAMUSCULAR | Status: AC
  Administered 2015-04-18: 1000 ug via INTRAMUSCULAR

## 2015-04-18 NOTE — Progress Notes (Signed)
Pre visit review using our clinic review tool, if applicable. No additional management support is needed unless otherwise documented below in the visit note.  Patient tolerated injection well.  

## 2015-05-21 ENCOUNTER — Ambulatory Visit: Payer: BLUE CROSS/BLUE SHIELD | Admitting: Internal Medicine

## 2015-05-21 ENCOUNTER — Other Ambulatory Visit: Payer: Self-pay | Admitting: Obstetrics and Gynecology

## 2015-05-21 LAB — HM PAP SMEAR

## 2015-05-21 LAB — HM MAMMOGRAPHY

## 2015-05-22 ENCOUNTER — Encounter: Payer: Self-pay | Admitting: Internal Medicine

## 2015-05-22 ENCOUNTER — Telehealth: Payer: Self-pay

## 2015-05-22 ENCOUNTER — Ambulatory Visit (INDEPENDENT_AMBULATORY_CARE_PROVIDER_SITE_OTHER): Payer: BLUE CROSS/BLUE SHIELD | Admitting: Internal Medicine

## 2015-05-22 VITALS — BP 116/74 | HR 80 | Temp 98.0°F | Ht 63.0 in | Wt 154.3 lb

## 2015-05-22 DIAGNOSIS — E785 Hyperlipidemia, unspecified: Secondary | ICD-10-CM

## 2015-05-22 DIAGNOSIS — E538 Deficiency of other specified B group vitamins: Secondary | ICD-10-CM | POA: Diagnosis not present

## 2015-05-22 DIAGNOSIS — Z1159 Encounter for screening for other viral diseases: Secondary | ICD-10-CM

## 2015-05-22 DIAGNOSIS — Z114 Encounter for screening for human immunodeficiency virus [HIV]: Secondary | ICD-10-CM

## 2015-05-22 DIAGNOSIS — Z09 Encounter for follow-up examination after completed treatment for conditions other than malignant neoplasm: Secondary | ICD-10-CM

## 2015-05-22 LAB — CYTOLOGY - PAP

## 2015-05-22 MED ORDER — CYANOCOBALAMIN 1000 MCG/ML IJ SOLN
1000.0000 ug | Freq: Once | INTRAMUSCULAR | Status: AC
  Administered 2015-05-22: 1000 ug via INTRAMUSCULAR

## 2015-05-22 MED ORDER — CYANOCOBALAMIN 1000 MCG/ML IJ SOLN: 1000.0000 ug | Freq: Once | INTRAMUSCULAR | Status: DC

## 2015-05-22 NOTE — Patient Instructions (Signed)
Get your blood work before you leave     Next visit  for a  physical exam in 6 months, fasting Please schedule an appointment at the front desk

## 2015-05-22 NOTE — Telephone Encounter (Signed)
Medical record release form faxed to [facility], Dr. Corinna Capra. Medical release form sent for scanning.

## 2015-05-22 NOTE — Assessment & Plan Note (Signed)
Hyperlipidemia: Used to be on Lipitor, developed leg cramps, now on Crestor. Labs (nonfasting but she is overdue for blood work) Depression anxiety: sx controlled, uses Xanax very seldom, no need for a UDS at this point B12 deficiency, on shots, check labs Coronary calcifications per CT: couldn't  schedule stress test, she is going to [place], will come back in a month and call us. Plan to schedule a stress test in November Primary care: likes to wait till November for the patient.

## 2015-05-22 NOTE — Progress Notes (Signed)
Pre visit review using our clinic review tool, if applicable. No additional management support is needed unless otherwise documented below in the visit note. 

## 2015-05-22 NOTE — Progress Notes (Signed)
Subjective:    Patient ID: [name] [name], female    DOB: November 11, 1950, 64 y.o.   MRN: [id number]  DOS:  05/22/2015 Type of visit - description : Routine checkup Interval history: + Coronary calcium, stress test not done. Patient is still interested Hypertension: Well controlled, recent BMP satisfactory Anxiety depression: On citalopram, very rarely takes Xanax. Doing well emotionally, has a boyfriend, currently asymptomatic. High cholesterol: Due for a FLP    Review of Systems  Denies chest pain or difficulty breathing No nausea, vomiting, diarrhea is at baseline, on Imodium  Past Medical History  Diagnosis Date  . Hypertension   . High cholesterol   . Anxiety   . Osteoporosis   . Hx of radiation therapy 10/07/11 to 11/20/11    right lung  . Hx of radiation therapy 01/07/2012- 01/21/12    cranial irradiation  . Lung cancer 09/19/11    SMALL CELL CARCINOMA  . Cholelithiasis   . Colon polyps     adenomatous  . Bilateral lower extremity pain   . Pitting edema     Bilateral lower extremities, duplex ultrasound 04/03/2015 normal, no DVT    Past Surgical History  Procedure Laterality Date  . Tubal ligation    . Cholecystectomy  04/2011    biliary stent placement    Social History   Social History  . Marital Status: Divorced    Spouse Name: N/A  . Number of Children: 2  . Years of Education: N/A   Occupational History  . retired    Social History Main Topics  . Smoking status: Former Smoker    Types: Cigarettes    Quit date: 09/08/2011  . Smokeless tobacco: Never Used  . Alcohol Use: Yes     Comment: occasional  . Drug Use: No  . Sexual Activity: Not on file   Other Topics Concern  . Not on file   Social History Narrative   divorced, 2 kids, lives by herself, has a boyfriend                      Medication List       This list is accurate as of: 05/22/15  4:56 PM.  Always use your most recent med list.               ALPRAZolam 0.5 MG  tablet  Commonly known as:  XANAX  Take 1-2 tablets (0.5-1 mg total) by mouth daily as needed for anxiety.     aspirin 81 MG tablet  Take 81 mg by mouth daily.     citalopram 40 MG tablet  Commonly known as:  CELEXA  Take 1 tablet (40 mg total) by mouth daily.     ESTRACE VAGINAL 0.1 MG/GM vaginal cream  Generic drug:  estradiol  Place 1 application onto the skin daily.     GAS-X PO  Take 2 tablets by mouth daily.     IMODIUM A-D PO  Take 10 mLs by mouth daily.     IMODIUM PO  Take 3 mg by mouth daily.     lisinopril 20 MG tablet  Commonly known as:  PRINIVIL,ZESTRIL  Take 1 tablet (20 mg total) by mouth daily.     raloxifene 60 MG tablet  Commonly known as:  EVISTA  Take 1 tablet by mouth daily     rosuvastatin 10 MG tablet  Commonly known as:  CRESTOR  Take 1 tablet (10 mg total) by mouth daily.  Vitamin D3 2000 UNITS capsule  Take 2,000 Units by mouth daily.           Objective:   Physical Exam BP 116/74 mmHg  Pulse 80  Temp(Src) 98 F (36.7 C) (Oral)  Ht '5\' 3"'$  (1.6 m)  Wt 154 lb 5 oz (69.996 kg)  BMI 27.34 kg/m2  SpO2 98% General:   Well developed, well nourished . NAD.  HEENT:  Normocephalic . Face symmetric, atraumatic Lungs:  CTA B Normal respiratory effort, no intercostal retractions, no accessory muscle use. Heart: RRR,  no murmur.  No pretibial edema bilaterally  Skin: Not pale. Not jaundice Neurologic:  alert & oriented X3.  Speech normal, gait appropriate for age and unassisted Psych--  Cognition and judgment appear intact.  Cooperative with normal attention span and concentration.  Behavior appropriate. No anxious or depressed appearing.      Assessment & Plan:   Assessment > HTN Hyperlipidemia -- cramps with Lipitor dc 2015 Depression . Anxiety -- on citalopram, rarely uses Xanax Osteoporosis B12 deficiency dx 01/2014 Vit D  def  COPD: Mild last PFTs  Lung cancer SCC DX 2013, XRT chest and brain Lower extremity edema  [place] (-) for DVT 03-2015 Coronary calcifications per CT chest 6 2016 IBS, diarrhea predominant   on Imodium as needed  Plan Hyperlipidemia: Used to be on Lipitor, developed leg cramps, now on Crestor. Labs (nonfasting but she is overdue for blood work) Depression anxiety: sx controlled, uses Xanax very seldom, no need for a UDS at this point B12 deficiency, on shots, check labs Coronary calcifications per CT: couldn't  schedule stress test, she is going to [place], will come back in a month and call us. Plan to schedule a stress test in November Primary care: likes to wait till November for the patient.

## 2015-05-22 NOTE — Telephone Encounter (Signed)
Received fax confirmation on 05/22/2015 at 1429.

## 2015-05-23 LAB — LIPID PANEL
CHOL/HDL RATIO: 3
Cholesterol: 273 mg/dL — ABNORMAL HIGH (ref 0–200)
HDL: 82.8 mg/dL (ref >39.00)
LDL Cholesterol: 164 mg/dL — ABNORMAL HIGH (ref 0–99)
NONHDL: 189.98
Triglycerides: 130 mg/dL (ref 0.0–149.0)
VLDL: 26 mg/dL (ref 0.0–40.0)

## 2015-05-23 LAB — HEPATITIS C ANTIBODY: HCV Ab: NEGATIVE

## 2015-05-23 LAB — VITAMIN B12: Vitamin B-12: >1500 pg/mL — ABNORMAL HIGH (ref 211–911)

## 2015-05-23 LAB — ALT: ALT: 11 U/L (ref 0–35)

## 2015-05-23 LAB — HIV ANTIBODY (ROUTINE TESTING W REFLEX): HIV: NONREACTIVE

## 2015-05-23 LAB — AST: AST: 18 U/L (ref 0–37)

## 2015-05-25 ENCOUNTER — Other Ambulatory Visit: Payer: Self-pay | Admitting: Obstetrics and Gynecology

## 2015-05-25 DIAGNOSIS — R928 Other abnormal and inconclusive findings on diagnostic imaging of breast: Secondary | ICD-10-CM

## 2015-05-30 MED ORDER — ROSUVASTATIN CALCIUM 20 MG PO TABS: 20.0000 mg | ORAL_TABLET | Freq: Every day | ORAL | Status: DC

## 2015-05-30 NOTE — Addendum Note (Signed)
Addended by: Wilfrid Lund on: 05/30/2015 01:57 PM   Modules accepted: Orders

## 2015-06-05 NOTE — Telephone Encounter (Signed)
Still have not received medical records, called and spoke with medical records department at [phone] they informed me that Pt has been a Pt since 2003 and wanted to know if I was requesting all records, informed just last several years and dexa scan and mammograms. Informed that they would gather the records and fax right over.

## 2015-06-06 NOTE — Telephone Encounter (Signed)
Received medical records from [facility]. Forwarded to Dr. Larose Kells.

## 2015-06-07 ENCOUNTER — Encounter: Payer: Self-pay | Admitting: Internal Medicine

## 2015-06-21 ENCOUNTER — Telehealth: Payer: Self-pay | Admitting: *Deleted

## 2015-06-21 NOTE — Telephone Encounter (Signed)
Medical records received from [facility]. Forwarded to Dr. Gaynelle Arabian. JG//CMA

## 2015-07-11 ENCOUNTER — Ambulatory Visit
Admission: RE | Admit: 2015-07-11 | Discharge: 2015-07-11 | Disposition: A | Discharge status: Home / Self Care | Payer: BLUE CROSS/BLUE SHIELD | Source: Ambulatory Visit | Attending: Obstetrics and Gynecology | Admitting: Obstetrics and Gynecology

## 2015-07-11 DIAGNOSIS — R928 Other abnormal and inconclusive findings on diagnostic imaging of breast: Secondary | ICD-10-CM

## 2015-08-02 ENCOUNTER — Ambulatory Visit (HOSPITAL_COMMUNITY)
Admission: RE | Admit: 2015-08-02 | Discharge: 2015-08-02 | Disposition: A | Discharge status: Home / Self Care | Payer: BLUE CROSS/BLUE SHIELD | Source: Ambulatory Visit | Attending: Internal Medicine | Admitting: Internal Medicine

## 2015-08-02 ENCOUNTER — Other Ambulatory Visit (HOSPITAL_BASED_OUTPATIENT_CLINIC_OR_DEPARTMENT_OTHER): Payer: BLUE CROSS/BLUE SHIELD

## 2015-08-02 ENCOUNTER — Encounter (HOSPITAL_COMMUNITY): Payer: Self-pay

## 2015-08-02 ENCOUNTER — Other Ambulatory Visit: Payer: Self-pay | Admitting: Medical Oncology

## 2015-08-02 DIAGNOSIS — C3491 Malignant neoplasm of unspecified part of right bronchus or lung: Secondary | ICD-10-CM | POA: Diagnosis not present

## 2015-08-02 DIAGNOSIS — Z923 Personal history of irradiation: Secondary | ICD-10-CM | POA: Diagnosis not present

## 2015-08-02 LAB — CBC WITH DIFFERENTIAL/PLATELET
BASO%: 0.7 % (ref 0.0–2.0)
BASOS ABS: 0 10*3/uL (ref 0.0–0.1)
EOS%: 2.5 % (ref 0.0–7.0)
Eosinophils Absolute: 0.1 10*3/uL (ref 0.0–0.5)
HCT: 36.9 % (ref 34.8–46.6)
HGB: 12.2 g/dL (ref 11.6–15.9)
LYMPH%: 15.7 % (ref 14.0–49.7)
MCH: 31.2 pg (ref 25.1–34.0)
MCHC: 33.1 g/dL (ref 31.5–36.0)
MCV: 94.4 fL (ref 79.5–101.0)
MONO#: 0.3 10*3/uL (ref 0.1–0.9)
MONO%: 7.1 % (ref 0.0–14.0)
NEUT#: 3.2 10*3/uL (ref 1.5–6.5)
NEUT%: 74 % (ref 38.4–76.8)
Platelets: 203 10*3/uL (ref 145–400)
RBC: 3.91 10*6/uL (ref 3.70–5.45)
RDW: 12.4 % (ref 11.2–14.5)
WBC: 4.3 10*3/uL (ref 3.9–10.3)
lymph#: 0.7 10*3/uL — ABNORMAL LOW (ref 0.9–3.3)

## 2015-08-02 LAB — COMPREHENSIVE METABOLIC PANEL
ALT: 10 U/L (ref 0–55)
ANION GAP: 10 meq/L (ref 3–11)
AST: 20 U/L (ref 5–34)
Albumin: 3.9 g/dL (ref 3.5–5.0)
Alkaline Phosphatase: 41 U/L (ref 40–150)
BILIRUBIN TOTAL: 0.38 mg/dL (ref 0.20–1.20)
BUN: 17.6 mg/dL (ref 7.0–26.0)
CO2: 24 meq/L (ref 22–29)
CREATININE: 0.9 mg/dL (ref 0.6–1.1)
Calcium: 9.2 mg/dL (ref 8.4–10.4)
Chloride: 108 mEq/L (ref 98–109)
EGFR: 67 mL/min/{1.73_m2} — ABNORMAL LOW (ref >90)
GLUCOSE: 91 mg/dL (ref 70–140)
Potassium: 4.5 mEq/L (ref 3.5–5.1)
Sodium: 141 mEq/L (ref 136–145)
TOTAL PROTEIN: 6.8 g/dL (ref 6.4–8.3)

## 2015-08-02 MED ORDER — IOHEXOL 300 MG/ML  SOLN
75.0000 mL | Freq: Once | INTRAMUSCULAR | Status: AC | PRN
  Administered 2015-08-02: 75 mL via INTRAVENOUS

## 2015-08-09 ENCOUNTER — Ambulatory Visit (HOSPITAL_BASED_OUTPATIENT_CLINIC_OR_DEPARTMENT_OTHER): Payer: BLUE CROSS/BLUE SHIELD | Admitting: Internal Medicine

## 2015-08-09 ENCOUNTER — Telehealth: Payer: Self-pay | Admitting: Internal Medicine

## 2015-08-09 ENCOUNTER — Encounter: Payer: Self-pay | Admitting: Internal Medicine

## 2015-08-09 VITALS — BP 123/88 | HR 93 | Temp 98.4°F | Resp 18 | Ht 63.0 in | Wt 156.3 lb

## 2015-08-09 DIAGNOSIS — Z85118 Personal history of other malignant neoplasm of bronchus and lung: Secondary | ICD-10-CM

## 2015-08-09 DIAGNOSIS — C3491 Malignant neoplasm of unspecified part of right bronchus or lung: Secondary | ICD-10-CM

## 2015-08-09 NOTE — Telephone Encounter (Signed)
per pof to sch pt appt-gave pt copy of avs-adv central sch willc all top sch scan

## 2015-08-09 NOTE — Progress Notes (Signed)
[facility] [phone]   Fax:([phone]) [address], [address] [place] [address]  DIAGNOSIS: Limited stage small cell lung cancer diagnosed in January of 2013   PRIOR THERAPY:  1) Systemic chemotherapy with cisplatin at 60 mg per meter square given on day 1 and etoposide at 120 mg per meter square given on days one 2 and 3 with Neulasta support given on day 4 status post 4 cycles, last cycle was given on 12/02/2011 . This with concurrent with radiotherapy under the care of Dr. Lisbeth Renshaw.  2) prophylactic cranial irradiation under the care of Dr. Lisbeth Renshaw completed on 01/21/2012   CURRENT THERAPY: Observation.  INTERVAL HISTORY: [name] [name] 64 y.o. female returns to the clinic today for routine six-month follow up visit. She has been on observation for the last 4 years. She is doing fine today with no specific complaints. She denied having any significant weight loss or night sweats. She d enied having any chest pain, shortness of breath, cough or hemoptysis. She has no nausea or vomiting, no fever or chills. She had repeat CT scan of the chest performed recently and she is here for evaluation and discussion of her scan results.  MEDICAL HISTORY: Past Medical History  Diagnosis Date  . Hypertension   . High cholesterol   . Anxiety   . Osteoporosis 04/2014    Dexa scan by Dr. Corinna Capra, T-score -2.7, next in 2 years, with at least 13 % major fracture in 10 years  . Hx of radiation therapy 10/07/11 to 11/20/11    right lung  . Hx of radiation therapy 01/07/2012- 01/21/12    cranial irradiation  . Lung cancer ([place]) 09/19/11    Stage III currently in remission  . Cholelithiasis   . Colon polyps     adenomatous  . Bilateral lower extremity pain   . Pitting edema     Bilateral lower extremities, duplex ultrasound 04/03/2015 normal, no DVT    ALLERGIES:  is allergic to codeine and penicillins.  MEDICATIONS:   Current Outpatient Prescriptions  Medication Sig Dispense Refill  . Cholecalciferol (VITAMIN D3) 2000 UNITS capsule Take 2,000 Units by mouth daily.    . citalopram (CELEXA) 40 MG tablet Take 1 tablet (40 mg total) by mouth daily. 30 tablet 6  . lisinopril (PRINIVIL,ZESTRIL) 20 MG tablet Take 1 tablet (20 mg total) by mouth daily. 30 tablet 6  . Loperamide HCl (IMODIUM A-D PO) Take 10 mLs by mouth daily.    . Loperamide HCl (IMODIUM PO) Take 3 mg by mouth daily.     . raloxifene (EVISTA) 60 MG tablet Take 1 tablet by mouth daily  12  . rosuvastatin (CRESTOR) 20 MG tablet Take 1 tablet (20 mg total) by mouth daily. 30 tablet 6  . Simethicone (GAS-X PO) Take 2 tablets by mouth daily.    Marland Kitchen ALPRAZolam (XANAX) 0.5 MG tablet Take 1-2 tablets (0.5-1 mg total) by mouth daily as needed for anxiety. (Patient not taking: Reported on 08/09/2015) 40 tablet 0  . [DISCONTINUED] sucralfate (CARAFATE) 1 G tablet Take 1 tablet (1 g total) by mouth 4 (four) times daily. Dissolve tablet in 15cc water. 120 tablet 1   No current facility-administered medications for this visit.    SURGICAL HISTORY:  Past Surgical History  Procedure Laterality Date  . Tubal ligation    . Cholecystectomy  04/2011    biliary stent placement  .  Incisional hernia repair  2015  . Mona lisa touch      Vaginal procedure    REVIEW OF SYSTEMS:  A comprehensive review of systems was negative.   PHYSICAL EXAMINATION: General appearance: alert, cooperative and no distress Head: Normocephalic, without obvious abnormality, atraumatic Neck: no adenopathy Lymph nodes: Cervical, supraclavicular, and axillary nodes normal. Resp: clear to auscultation bilaterally Cardio: regular rate and rhythm, S1, S2 normal, no murmur, click, rub or gallop GI: soft, non-tender; bowel sounds normal; no masses,  no organomegaly Extremities: extremities normal, atraumatic, no cyanosis or edema  ECOG PERFORMANCE STATUS: 0 - Asymptomatic  Blood pressure  123/88, pulse 93, temperature 98.4 F (36.9 C), temperature source Oral, resp. rate 18, height '5\' 3"'$  (1.6 m), weight 156 lb 4.8 oz (70.897 kg), SpO2 99 %.  LABORATORY DATA: Lab Results  Component Value Date   WBC 4.3 08/02/2015   HGB 12.2 08/02/2015   HCT 36.9 08/02/2015   MCV 94.4 08/02/2015   PLT 203 08/02/2015      Chemistry      Component Value Date/Time   NA 141 08/02/2015 1037   NA 132* 12/03/2012 1138   NA 133 03/23/2012 1305   K 4.5 08/02/2015 1037   K 4.0 12/03/2012 1138   K 4.4 03/23/2012 1305   CL 104 12/23/2012 1338   CL 99 12/03/2012 1138   CL 93* 03/23/2012 1305   CO2 24 08/02/2015 1037   CO2 26 12/03/2012 1138   CO2 25 03/23/2012 1305   BUN 17.6 08/02/2015 1037   BUN 13 12/03/2012 1138   BUN 10 03/23/2012 1305   CREATININE 0.9 08/02/2015 1037   CREATININE 0.9 12/03/2012 1138   CREATININE 1.0 03/23/2012 1305      Component Value Date/Time   CALCIUM 9.2 08/02/2015 1037   CALCIUM 9.2 12/03/2012 1138   CALCIUM 9.4 03/23/2012 1305   ALKPHOS 41 08/02/2015 1037   ALKPHOS 50 04/01/2012 1417   ALKPHOS 48 03/23/2012 1305   AST 20 08/02/2015 1037   AST 18 05/22/2015 1445   AST 18 03/23/2012 1305   ALT 10 08/02/2015 1037   ALT 11 05/22/2015 1445   ALT 19 03/23/2012 1305   BILITOT 0.38 08/02/2015 1037   BILITOT 0.4 04/01/2012 1417   BILITOT 0.60 03/23/2012 1305       RADIOGRAPHIC STUDIES: Ct Chest W Contrast  08/02/2015  CLINICAL DATA:  Right small cell lung cancer, diagnosed 2013, XRT and chemotherapy complete. EXAM: CT CHEST WITH CONTRAST TECHNIQUE: Multidetector CT imaging of the chest was performed during intravenous contrast administration. CONTRAST:  64m OMNIPAQUE IOHEXOL 300 MG/ML  SOLN COMPARISON:  02/05/2015 FINDINGS: Mediastinum/Nodes: Heart is normal in size. No pericardial effusion. Coronary atherosclerosis in the LAD. No suspicious mediastinal, hilar, or axillary lymphadenopathy. 5 mm short axis node at the left thoracic inlet (series 2/image  9), previously 4 mm, within normal limits. Lungs/Pleura: Radiation changes in the right upper lobe/paramediastinal region. No suspicious pulmonary nodules. Mild scarring in the medial right lung apex. No focal consolidation. No pleural effusion or pneumothorax. Upper abdomen: Visualized upper abdomen is notable for cholecystectomy clips. Musculoskeletal: Degenerative changes of the visualized thoracolumbar spine. IMPRESSION: Radiation changes in the right upper lobe/paramediastinal region. No evidence of recurrent or metastatic disease. Electronically Signed   By: SJulian HyM.D.   On: 08/02/2015 13:17   [place]ast [facility] Uni Left Inc [place]  07/11/2015  CLINICAL DATA:  Patient returns today to evaluate a possible left breast mass identified on recent screening mammogram. EXAM:  DIGITAL DIAGNOSTIC LEFT MAMMOGRAM WITH 3D TOMOSYNTHESIS WITH CAD ULTRASOUND LEFT BREAST COMPARISON:  Previous exams including recent screening mammogram dated 05/21/2015. ACR Breast Density Category b: There are scattered areas of fibroglandular density. FINDINGS: On today's additional views with spot compression and 3D tomosynthesis, there is an oval circumscribed mass confirmed within the central left breast at middle depth measuring approximately 8 mm greatest dimension (measured on tomosynthesis CC slice 27). Mammographic images were processed with CAD. Targeted ultrasound is performed, showing no sonographic correlate for the the left breast mass seen on mammogram. Only normal fibroglandular tissues and fat lobules are seen throughout the central left breast. No suspicious solid or cystic masses identified. IMPRESSION: Oval circumscribed mass within the central left breast, at middle depth, without sonographic correlate. In retrospect, this is probably unchanged compared to the mammogram of 04/29/2013, although better seen on tomosynthesis. This may represent a sonographically occult fibroadenoma or an [place] of normal dense  fibroglandular tissues. Regardless, this is a probably benign finding for which a six-month follow-up left breast diagnostic mammogram is recommended to ensure stability. RECOMMENDATION: Follow-up left breast diagnostic mammogram, with tomosynthesis, in 6 months. I have discussed the findings and recommendations with the patient. Results were also provided in writing at the conclusion of the visit. If applicable, a reminder letter will be sent to the patient regarding the next appointment. BI-RADS CATEGORY  3: Probably benign. Electronically Signed   By: Franki Cabot M.D.   On: 07/11/2015 14:22   Mm Diag Breast Tomo Uni Left  07/11/2015  CLINICAL DATA:  Patient returns today to evaluate a possible left breast mass identified on recent screening mammogram. EXAM: DIGITAL DIAGNOSTIC LEFT MAMMOGRAM WITH 3D TOMOSYNTHESIS WITH CAD ULTRASOUND LEFT BREAST COMPARISON:  Previous exams including recent screening mammogram dated 05/21/2015. ACR Breast Density Category b: There are scattered areas of fibroglandular density. FINDINGS: On today's additional views with spot compression and 3D tomosynthesis, there is an oval circumscribed mass confirmed within the central left breast at middle depth measuring approximately 8 mm greatest dimension (measured on tomosynthesis CC slice 27). Mammographic images were processed with CAD. Targeted ultrasound is performed, showing no sonographic correlate for the the left breast mass seen on mammogram. Only normal fibroglandular tissues and fat lobules are seen throughout the central left breast. No suspicious solid or cystic masses identified. IMPRESSION: Oval circumscribed mass within the central left breast, at middle depth, without sonographic correlate. In retrospect, this is probably unchanged compared to the mammogram of 04/29/2013, although better seen on tomosynthesis. This may represent a sonographically occult fibroadenoma or an [place] of normal dense fibroglandular tissues.  Regardless, this is a probably benign finding for which a six-month follow-up left breast diagnostic mammogram is recommended to ensure stability. RECOMMENDATION: Follow-up left breast diagnostic mammogram, with tomosynthesis, in 6 months. I have discussed the findings and recommendations with the patient. Results were also provided in writing at the conclusion of the visit. If applicable, a reminder letter will be sent to the patient regarding the next appointment. BI-RADS CATEGORY  3: Probably benign. Electronically Signed   By: Franki Cabot M.D.   On: 07/11/2015 14:22   ASSESSMENT AND PLAN: this is a very pleasant 64 years old white female with limited stage small cell lung cancer status post systemic chemotherapy with cisplatin and etoposide concurrent with radiation followed by prophylactic cranial irradiation.  The patient has been doing fine for the last 4 years and no evidence for disease recurrence on the recent CT scan of the chest.  I discussed the scan results with the patient today. I recommended for her to come back for followup visit in 6 months with repeat CT scan of the chest. She was advised to call immediately if she has any concerning symptoms in the interval.  The patient voices understanding of current disease status and treatment options and is in agreement with the current care plan.  All questions were answered. The patient knows to call the clinic with any problems, questions or concerns. We can certainly see the patient much sooner if necessary.  Disclaimer: This note was dictated with voice recognition software. Similar sounding words can inadvertently be transcribed and may not be corrected upon review.

## 2015-10-18 ENCOUNTER — Telehealth: Payer: Self-pay | Admitting: Internal Medicine

## 2015-10-18 NOTE — Telephone Encounter (Signed)
Relationship to patient: Self  Can be [phone]  Reason for call: Pt is requesting a call back from a nurse or CMA directly. Asked pt what is her concern. Pt wouldn't say she says that she would just like a call back .

## 2015-10-18 NOTE — Telephone Encounter (Signed)
thx

## 2015-10-18 NOTE — Telephone Encounter (Signed)
Spoke w/ Pt, she was calling to request advice for her cold that she has had for 3 days. Mainly having runny nose, no "real cough", denies having fever or chills. Informed her that Mucinex DM, Flonase are Dr. Ethel Rana go to's. Pt stated she would try the OTC medications first and will call back if not improving. She also wanted to make Dr. Larose Kells aware that the front desk stated that she couldn't talk w/ me or him this morning w/o scheduling an appt, which I informed her is not the case. Informed her that sometimes it can take [place] several hours to get back to her if we are in clinic. She verbalized understanding and thanked me for my time.

## 2015-11-02 ENCOUNTER — Telehealth: Payer: Self-pay | Admitting: Internal Medicine

## 2015-11-02 NOTE — Telephone Encounter (Signed)
LVM for pt to update info. Flu shot

## 2015-11-16 ENCOUNTER — Telehealth: Payer: Self-pay | Admitting: Behavioral Health

## 2015-11-16 NOTE — Telephone Encounter (Signed)
Patient voiced that she was on her way out the door, but will return call in an hour to complete pre-visit info.

## 2015-11-19 ENCOUNTER — Encounter: Payer: Self-pay | Admitting: Internal Medicine

## 2015-11-19 ENCOUNTER — Ambulatory Visit (INDEPENDENT_AMBULATORY_CARE_PROVIDER_SITE_OTHER): Payer: BLUE CROSS/BLUE SHIELD | Admitting: Internal Medicine

## 2015-11-19 VITALS — BP 122/74 | HR 100 | Temp 98.3°F | Ht 63.0 in | Wt 158.2 lb

## 2015-11-19 DIAGNOSIS — I2510 Atherosclerotic heart disease of native coronary artery without angina pectoris: Secondary | ICD-10-CM

## 2015-11-19 DIAGNOSIS — Z09 Encounter for follow-up examination after completed treatment for conditions other than malignant neoplasm: Secondary | ICD-10-CM

## 2015-11-19 DIAGNOSIS — E785 Hyperlipidemia, unspecified: Secondary | ICD-10-CM

## 2015-11-19 DIAGNOSIS — R002 Palpitations: Secondary | ICD-10-CM | POA: Diagnosis not present

## 2015-11-19 DIAGNOSIS — Z0000 Encounter for general adult medical examination without abnormal findings: Secondary | ICD-10-CM

## 2015-11-19 DIAGNOSIS — I2584 Coronary atherosclerosis due to calcified coronary lesion: Secondary | ICD-10-CM

## 2015-11-19 DIAGNOSIS — E538 Deficiency of other specified B group vitamins: Secondary | ICD-10-CM

## 2015-11-19 NOTE — Progress Notes (Signed)
Pre visit review using our clinic review tool, if applicable. No additional management support is needed unless otherwise documented below in the visit note. 

## 2015-11-19 NOTE — Assessment & Plan Note (Signed)
Td shot 2008 , s/p zostavax ; pnm 23 2003; prevnar 2016  Sees gyn: they take care of osteoporosis-[facility]-PAP  CCS: colonoscopy in 2007 @ [facility], 2 polyps Colonoscopy 05-2013, + polyps, next per GI   Diet and exercise discussed

## 2015-11-19 NOTE — Patient Instructions (Signed)
  GO TO THE FRONT DESK Schedule labs to be done this week, fasting Schedule your next appointment for a  Routine check up When?   8 months Fasting?  No

## 2015-11-19 NOTE — Progress Notes (Signed)
Subjective:    Patient ID: [name] [name], female    DOB: 10-Sep-1950, 65 y.o.   MRN: [id number]  DOS:  11/19/2015 Type of visit - description : CPX Interval history:  Her main concern today is gaining weight. Admits that she is eating out every night, just started back some exercise.     Review of Systems Constitutional: No fever. No chills. No unexplained wt changes. No unusual sweats  HEENT: No dental problems, no ear discharge, no facial swelling, no voice changes. No eye discharge, no eye  redness , no  intolerance to light   Respiratory: No wheezing , no  difficulty breathing. No cough , no mucus production  Cardiovascular: No CP, no leg swelling. + Palpitations, frequent in the mornings, last a minute or 2, no associated symptoms such as near syncope, chest pain, nausea or diaphoresis.  GI: no nausea, no vomiting, no diarrhea , no  abdominal pain.  No blood in the stools. No dysphagia, no odynophagia    Endocrine: No polyphagia, no polyuria , no polydipsia. + Hot flashes on and off since she started Evista  GU: No dysuria, gross hematuria, difficulty urinating. No urinary urgency, no frequency.  Musculoskeletal: No joint swellings or unusual aches or pains  Skin: No change in the color of the skin, palor , no  Rash  Allergic, immunologic: No environmental allergies , no  food allergies  Neurological: No dizziness no  syncope. No headaches. No diplopia, no slurred, no slurred speech, no motor deficits, no facial  Numbness  Hematological: No enlarged lymph nodes, no easy bruising , no unusual bleedings  Psychiatry: No suicidal ideas, no hallucinations, no beavior problems, no confusion.  No unusual/severe anxiety, no depression   Past Medical History  Diagnosis Date  . Hypertension   . High cholesterol   . Anxiety   . Osteoporosis 04/2014    Dexa scan by Dr. Corinna Capra, T-score -2.7, next in 2 years, with at least 13 % major fracture in 10 years  . Hx of  radiation therapy 10/07/11 to 11/20/11    right lung  . Hx of radiation therapy 01/07/2012- 01/21/12    cranial irradiation  . Lung cancer ([place]) 09/19/11    Stage III currently in remission  . Cholelithiasis   . Colon polyps     adenomatous  . Bilateral lower extremity pain   . Pitting edema     Bilateral lower extremities, duplex ultrasound 04/03/2015 normal, no DVT    Past Surgical History  Procedure Laterality Date  . Tubal ligation    . Cholecystectomy  04/2011    biliary stent placement  . Incisional hernia repair  2015  . Mona lisa touch      Vaginal procedure    Social History   Social History  . Marital Status: Divorced    Spouse Name: N/A  . Number of Children: 2  . Years of Education: N/A   Occupational History  . retired    Social History Main Topics  . Smoking status: Former Smoker    Types: Cigarettes    Quit date: 09/08/2011  . Smokeless tobacco: Never Used  . Alcohol Use: Yes     Comment: occasional  . Drug Use: No  . Sexual Activity: Not on file   Other Topics Concern  . Not on file   Social History Narrative   divorced, 2 kids, lives by herself, has a boyfriend  Family History  Problem Relation Age of Onset  . Bladder Cancer Father   . Stroke Father   . Colon polyps Father   . Atrial fibrillation Mother   . Heart disease Mother     CHF  . Clotting disorder Mother     warfarin  . Colon polyps Mother   . Colon polyps Sister   . Colon cancer Maternal Aunt 25  . Breast cancer Neg Hx       Medication List       This list is accurate as of: 11/19/15 11:59 PM.  Always use your most recent med list.               ALPRAZolam 0.5 MG tablet  Commonly known as:  XANAX  Take 1-2 tablets (0.5-1 mg total) by mouth daily as needed for anxiety.     APPLE CIDER VINEGAR PO  Take by mouth. 2 tbsp by mouth daily     citalopram 40 MG tablet  Commonly known as:  CELEXA  Take 1 tablet (40 mg total) by mouth daily.      IMODIUM PO  Take 3 mLs by mouth daily.     lisinopril 20 MG tablet  Commonly known as:  PRINIVIL,ZESTRIL  Take 1 tablet (20 mg total) by mouth daily.     raloxifene 60 MG tablet  Commonly known as:  EVISTA  20 mg daily     rosuvastatin 20 MG tablet  Commonly known as:  CRESTOR  Take 1 tablet (20 mg total) by mouth daily.     Vitamin D3 2000 units capsule  Take 4,000 Units by mouth daily.           Objective:   Physical Exam BP 122/74 mmHg  Pulse 100  Temp(Src) 98.3 F (36.8 C) (Oral)  Ht '5\' 3"'$  (1.6 m)  Wt 158 lb 4 oz (71.782 kg)  BMI 28.04 kg/m2  SpO2 97%  General:   Well developed, well nourished . NAD.  Neck: No  Thyromegaly  HEENT:  Normocephalic . Face symmetric, atraumatic Lungs:  CTA B Normal respiratory effort, no intercostal retractions, no accessory muscle use. Heart: RRR,  no murmur.  No pretibial edema bilaterally  Abdomen:  Not distended, soft, non-tender. No rebound or rigidity.   Skin: Exposed areas without rash. Not pale. Not jaundice Neurologic:  alert & oriented X3.  Speech normal, gait appropriate for age and unassisted Strength symmetric and appropriate for age.  Psych: Cognition and judgment appear intact.  Cooperative with normal attention span and concentration.  Behavior appropriate. No anxious or depressed appearing.    Assessment & Plan:   Assessment > HTN Hyperlipidemia -- cramps with Lipitor dc 2015 Depression . Anxiety -- on citalopram, rarely uses Xanax Osteoporosis-- per DEXA at gyn 05-10-2014 --->  was on boniva, now evista  B12 deficiency dx 01/2014 Vit D  def  COPD: Mild last PFTs  Lung cancer SCC DX 2013, XRT chest and brain Lower extremity edema [place] (-) for DVT 03-2015 Coronary calcifications per CT chest 6 2016 IBS, diarrhea predominant   on Imodium as needed ASA intolerant (tinnitus)   PLAN: HTN: cont lisinopril,  Hyperlipidemia: Restarted Crestor, labs from 04-2015 --> cholesterol   still elevated and Crestor  dose increased Check a FLP, AST, ALT Depression anxiety: Well-controlled Osteoporosis: Per gynecology. Recommend calcium and vitamin D daily B12: No shots recently, check labs, if mild deficiency  only, will rx po supplements Lung cancer:per oncology Palpitations: Check EKG ----> nsr  Coronary  calcifications per CT: Schedule stress test  as panned  RTC 8 months

## 2015-11-20 NOTE — Assessment & Plan Note (Signed)
HTN: cont lisinopril,  Hyperlipidemia: Restarted Crestor, labs from 04-2015 --> cholesterol   still elevated and Crestor dose increased Check a FLP, AST, ALT Depression anxiety: Well-controlled Osteoporosis: Per gynecology. Recommend calcium and vitamin D daily B12: No shots recently, check labs, if mild deficiency  only, will rx po supplements Lung cancer:per oncology Palpitations: Check EKG ----> nsr  Coronary  calcifications per CT: Schedule stress test  as panned  RTC 8 months

## 2015-11-22 ENCOUNTER — Other Ambulatory Visit (INDEPENDENT_AMBULATORY_CARE_PROVIDER_SITE_OTHER): Payer: BLUE CROSS/BLUE SHIELD

## 2015-11-22 DIAGNOSIS — E538 Deficiency of other specified B group vitamins: Secondary | ICD-10-CM | POA: Diagnosis not present

## 2015-11-22 DIAGNOSIS — E785 Hyperlipidemia, unspecified: Secondary | ICD-10-CM

## 2015-11-22 LAB — LIPID PANEL
CHOL/HDL RATIO: 4
CHOLESTEROL: 281 mg/dL — AB (ref 0–200)
HDL: 79.6 mg/dL (ref >39.00)
LDL Cholesterol: 184 mg/dL — ABNORMAL HIGH (ref 0–99)
NonHDL: 200.9
TRIGLYCERIDES: 84 mg/dL (ref 0.0–149.0)
VLDL: 16.8 mg/dL (ref 0.0–40.0)

## 2015-11-22 LAB — VITAMIN B12: Vitamin B-12: 395 pg/mL (ref 211–911)

## 2015-11-22 LAB — ALT: ALT: 12 U/L (ref 0–35)

## 2015-11-22 LAB — AST: AST: 19 U/L (ref 0–37)

## 2015-11-26 MED ORDER — ROSUVASTATIN CALCIUM 40 MG PO TABS: 40.0000 mg | ORAL_TABLET | Freq: Every day | ORAL | Status: DC

## 2015-11-26 NOTE — Addendum Note (Signed)
Addended by: Damita Dunnings D on: 11/26/2015 09:49 AM   Modules accepted: Orders, Medications

## 2015-12-10 ENCOUNTER — Other Ambulatory Visit: Payer: Self-pay | Admitting: Obstetrics and Gynecology

## 2015-12-10 DIAGNOSIS — N63 Unspecified lump in unspecified breast: Secondary | ICD-10-CM

## 2015-12-10 DIAGNOSIS — N6489 Other specified disorders of breast: Secondary | ICD-10-CM

## 2015-12-20 ENCOUNTER — Other Ambulatory Visit: Payer: Self-pay | Admitting: Internal Medicine

## 2015-12-25 ENCOUNTER — Encounter: Payer: Self-pay | Admitting: Medical

## 2015-12-25 ENCOUNTER — Other Ambulatory Visit: Payer: Self-pay | Admitting: Internal Medicine

## 2015-12-25 ENCOUNTER — Ambulatory Visit (INDEPENDENT_AMBULATORY_CARE_PROVIDER_SITE_OTHER): Payer: BLUE CROSS/BLUE SHIELD | Admitting: Medical

## 2015-12-25 ENCOUNTER — Ambulatory Visit (HOSPITAL_BASED_OUTPATIENT_CLINIC_OR_DEPARTMENT_OTHER)
Admission: RE | Admit: 2015-12-25 | Discharge: 2015-12-25 | Disposition: A | Discharge status: Home / Self Care | Payer: BLUE CROSS/BLUE SHIELD | Source: Ambulatory Visit | Attending: Medical | Admitting: Medical

## 2015-12-25 VITALS — BP 122/80 | HR 80 | Temp 98.1°F | Ht 63.0 in | Wt 160.6 lb

## 2015-12-25 DIAGNOSIS — S0083XA Contusion of other part of head, initial encounter: Secondary | ICD-10-CM | POA: Insufficient documentation

## 2015-12-25 DIAGNOSIS — R220 Localized swelling, mass and lump, head: Secondary | ICD-10-CM | POA: Diagnosis not present

## 2015-12-25 DIAGNOSIS — W19XXXA Unspecified fall, initial encounter: Secondary | ICD-10-CM | POA: Diagnosis not present

## 2015-12-25 NOTE — Progress Notes (Signed)
Pre visit review using our clinic review tool, if applicable. No additional management support is needed unless otherwise documented below in the visit note. 

## 2015-12-25 NOTE — Patient Instructions (Addendum)
For your facial contusions we will get xray of face series. You have no neurologic signs or symptoms after fall. And negative neurologic exam.   Moisturize  Your face daily and warm compresses. May take some time for bruises to absorb. And bruising may follow gravity as well.  Follow up in 7-10 days or as needed

## 2015-12-25 NOTE — Telephone Encounter (Signed)
Rx sent to the pharmacy by e-script.//AB/CMA 

## 2015-12-25 NOTE — Progress Notes (Signed)
Subjective:    Patient ID: [name] [name], female    DOB: 03-20-1951, 65 y.o.   MRN: [id number]  HPI   Pt in for fall. This happened on Friday. Pt slipped on side of the pool. Water was on side of the pool. She slipped and hit her face/head. No loc. Pt has some bruising around her eye. Swollen area over rt forehead.  After fall, no blurred,vison, no eye pain, no dizziness, no nausea, no vomiting, no gross motor or sensory function deficits. No ha after the fall.    Review of Systems  Constitutional: Negative for fever, chills and fatigue.  Respiratory: Negative for cough, chest tightness, shortness of breath and wheezing.   Cardiovascular: Negative for chest pain and palpitations.  Musculoskeletal: Negative for gait problem.  Skin: Positive for color change.       brusises  Neurological: Negative for dizziness, syncope, weakness and headaches.  Hematological: Negative for adenopathy. Does not bruise/bleed easily.  Psychiatric/Behavioral: Negative for behavioral problems and confusion.    Past Medical History  Diagnosis Date  . Hypertension   . High cholesterol   . Anxiety   . Osteoporosis 04/2014    Dexa scan by Dr. Corinna Capra, T-score -2.7, next in 2 years, with at least 13 % major fracture in 10 years  . Hx of radiation therapy 10/07/11 to 11/20/11    right lung  . Hx of radiation therapy 01/07/2012- 01/21/12    cranial irradiation  . Lung cancer ([place]) 09/19/11    Stage III currently in remission  . Cholelithiasis   . Colon polyps     adenomatous  . Bilateral lower extremity pain   . Pitting edema     Bilateral lower extremities, duplex ultrasound 04/03/2015 normal, no DVT     Social History   Social History  . Marital Status: Divorced    Spouse Name: N/A  . Number of Children: 2  . Years of Education: N/A   Occupational History  . retired    Social History Main Topics  . Smoking status: Former Smoker    Types: Cigarettes    Quit date: 09/08/2011  .  Smokeless tobacco: Never Used  . Alcohol Use: Yes     Comment: occasional  . Drug Use: No  . Sexual Activity: Not on file   Other Topics Concern  . Not on file   Social History Narrative   divorced, 2 kids, lives by herself, has a boyfriend                  Past Surgical History  Procedure Laterality Date  . Tubal ligation    . Cholecystectomy  04/2011    biliary stent placement  . Incisional hernia repair  2015  . Mona lisa touch      Vaginal procedure    Family History  Problem Relation Age of Onset  . Bladder Cancer Father   . Stroke Father   . Colon polyps Father   . Atrial fibrillation Mother   . Heart disease Mother     CHF  . Clotting disorder Mother     warfarin  . Colon polyps Mother   . Colon polyps Sister   . Colon cancer Maternal Aunt 27  . Breast cancer Neg Hx     Allergies  Allergen Reactions  . Codeine Nausea And Vomiting  . Penicillins Rash    Current Outpatient Prescriptions on File Prior to Visit  Medication Sig Dispense Refill  . ALPRAZolam (XANAX) 0.5  MG tablet Take 1-2 tablets (0.5-1 mg total) by mouth daily as needed for anxiety. 40 tablet 0  . APPLE CIDER VINEGAR PO Take by mouth. 2 tbsp by mouth daily    . Cholecalciferol (VITAMIN D3) 2000 UNITS capsule Take 4,000 Units by mouth daily.     . citalopram (CELEXA) 40 MG tablet Take 1 tablet (40 mg total) by mouth daily. 30 tablet 6  . lisinopril (PRINIVIL,ZESTRIL) 20 MG tablet TAKE 1 TABLET BY MOUTH DAILY 30 tablet 2  . Loperamide HCl (IMODIUM PO) Take 3 mLs by mouth daily.    . raloxifene (EVISTA) 60 MG tablet 20 mg daily  12  . rosuvastatin (CRESTOR) 40 MG tablet Take 1 tablet (40 mg total) by mouth daily. 30 tablet 6  . [DISCONTINUED] sucralfate (CARAFATE) 1 G tablet Take 1 tablet (1 g total) by mouth 4 (four) times daily. Dissolve tablet in 15cc water. 120 tablet 1   No current facility-administered medications on file prior to visit.    BP 122/80 mmHg  Pulse 80  Temp(Src) 98.1  F (36.7 C) (Oral)  Ht '5\' 3"'$  (1.6 m)  Wt 160 lb 9.6 oz (72.848 kg)  BMI 28.46 kg/m2  SpO2 98%      Objective:   Physical Exam  General Mental Status- Alert. General Appearance- Not in acute distress.   Skin General: Color- Normal Color. Moisture- Normal Moisture.  Neck Carotid Arteries- Normal color. Moisture- Normal Moisture. No carotid bruits. No JVD.  Chest and Lung Exam Auscultation: Breath Sounds:-Normal.  Cardiovascular Auscultation:Rythm- Regular. Murmurs & Other Heart Sounds:Auscultation of the heart reveals- No Murmurs.   Neurologic Cranial Nerve exam:- CN III-XII intact(No nystagmus), symmetric smile. Drift Test:- No drift. Romberg Exam:- Negative.  Heal to Toe Gait exam:-Normal. Finger to Nose:- Normal/Intact Strength:- 5/5 equal and symmetric strength both upper and lower extremities.  Skin- bruising around face around her eyes. Raccoon eye pattern. Rt frontal area swollen region and tender. No tenderness over zygomatic arches.     Assessment & Plan:  For your facial contusions we will get xray of face series. You have no neurologic signs or symptoms after fall. And negative neurologic exam.   Moisturize  Your face daily and warm compresses. May take some time for bruises to absorb. And bruising may follow gravity as well.  Follow up in 7-10 days or as needed. We will notify you on xray results when those are in.

## 2015-12-27 ENCOUNTER — Ambulatory Visit
Admission: RE | Admit: 2015-12-27 | Discharge: 2015-12-27 | Disposition: A | Discharge status: Home / Self Care | Payer: BLUE CROSS/BLUE SHIELD | Source: Ambulatory Visit | Attending: Obstetrics and Gynecology | Admitting: Obstetrics and Gynecology

## 2015-12-27 DIAGNOSIS — N6489 Other specified disorders of breast: Secondary | ICD-10-CM

## 2016-02-05 ENCOUNTER — Other Ambulatory Visit (HOSPITAL_BASED_OUTPATIENT_CLINIC_OR_DEPARTMENT_OTHER): Payer: BLUE CROSS/BLUE SHIELD

## 2016-02-05 ENCOUNTER — Ambulatory Visit (HOSPITAL_COMMUNITY)
Admission: RE | Admit: 2016-02-05 | Discharge: 2016-02-05 | Disposition: A | Discharge status: Home / Self Care | Payer: BLUE CROSS/BLUE SHIELD | Source: Ambulatory Visit | Attending: Internal Medicine | Admitting: Internal Medicine

## 2016-02-05 ENCOUNTER — Encounter (HOSPITAL_COMMUNITY): Payer: Self-pay

## 2016-02-05 DIAGNOSIS — R918 Other nonspecific abnormal finding of lung field: Secondary | ICD-10-CM | POA: Diagnosis not present

## 2016-02-05 DIAGNOSIS — Z85118 Personal history of other malignant neoplasm of bronchus and lung: Secondary | ICD-10-CM | POA: Diagnosis not present

## 2016-02-05 DIAGNOSIS — C3491 Malignant neoplasm of unspecified part of right bronchus or lung: Secondary | ICD-10-CM | POA: Diagnosis not present

## 2016-02-05 DIAGNOSIS — I2510 Atherosclerotic heart disease of native coronary artery without angina pectoris: Secondary | ICD-10-CM | POA: Insufficient documentation

## 2016-02-05 DIAGNOSIS — I700 Atherosclerosis of aorta: Secondary | ICD-10-CM | POA: Diagnosis not present

## 2016-02-05 LAB — COMPREHENSIVE METABOLIC PANEL
ALBUMIN: 4 g/dL (ref 3.5–5.0)
ALK PHOS: 37 U/L — AB (ref 40–150)
ALT: 15 U/L (ref 0–55)
AST: 21 U/L (ref 5–34)
Anion Gap: 9 mEq/L (ref 3–11)
BILIRUBIN TOTAL: 0.4 mg/dL (ref 0.20–1.20)
BUN: 14.7 mg/dL (ref 7.0–26.0)
CALCIUM: 9.6 mg/dL (ref 8.4–10.4)
CO2: 23 mEq/L (ref 22–29)
Chloride: 108 mEq/L (ref 98–109)
Creatinine: 1 mg/dL (ref 0.6–1.1)
EGFR: 64 mL/min/{1.73_m2} — AB (ref >90)
Glucose: 97 mg/dl (ref 70–140)
POTASSIUM: 4.5 meq/L (ref 3.5–5.1)
Sodium: 140 mEq/L (ref 136–145)
Total Protein: 6.9 g/dL (ref 6.4–8.3)

## 2016-02-05 LAB — CBC WITH DIFFERENTIAL/PLATELET
BASO%: 0.5 % (ref 0.0–2.0)
Basophils Absolute: 0 10*3/uL (ref 0.0–0.1)
EOS%: 2.6 % (ref 0.0–7.0)
Eosinophils Absolute: 0.1 10*3/uL (ref 0.0–0.5)
HCT: 37.5 % (ref 34.8–46.6)
HGB: 12.5 g/dL (ref 11.6–15.9)
LYMPH%: 11 % — AB (ref 14.0–49.7)
MCH: 30.8 pg (ref 25.1–34.0)
MCHC: 33.2 g/dL (ref 31.5–36.0)
MCV: 92.6 fL (ref 79.5–101.0)
MONO#: 0.6 10*3/uL (ref 0.1–0.9)
MONO%: 11.8 % (ref 0.0–14.0)
NEUT%: 74.1 % (ref 38.4–76.8)
NEUTROS ABS: 3.5 10*3/uL (ref 1.5–6.5)
PLATELETS: 184 10*3/uL (ref 145–400)
RBC: 4.05 10*6/uL (ref 3.70–5.45)
RDW: 12.6 % (ref 11.2–14.5)
WBC: 4.7 10*3/uL (ref 3.9–10.3)
lymph#: 0.5 10*3/uL — ABNORMAL LOW (ref 0.9–3.3)

## 2016-02-05 MED ORDER — IOPAMIDOL (ISOVUE-300) INJECTION 61%
75.0000 mL | Freq: Once | INTRAVENOUS | Status: AC | PRN
  Administered 2016-02-05: 75 mL via INTRAVENOUS

## 2016-02-06 NOTE — Progress Notes (Deleted)
Disposition: LCSWA met with patient. Informed patient about DC.  Called Facility AC and confirmed placement.  Sent DC summary and clinicals to facility/[facility] Patient denied LCSWA to call family.  Patient Fullcode. Transport PTAR. Nurse given report.  LCSWA signing off at this time.   [name] [name], LCSWA, MSW Clinical Social Worker 5E and Psychiatric Service Line [phone] 02/06/2016 4:19 PM 

## 2016-02-07 ENCOUNTER — Ambulatory Visit (HOSPITAL_BASED_OUTPATIENT_CLINIC_OR_DEPARTMENT_OTHER): Payer: BLUE CROSS/BLUE SHIELD | Admitting: Internal Medicine

## 2016-02-07 ENCOUNTER — Encounter: Payer: Self-pay | Admitting: Internal Medicine

## 2016-02-07 VITALS — BP 130/75 | HR 93 | Temp 98.2°F | Resp 17 | Ht 63.0 in | Wt 156.4 lb

## 2016-02-07 DIAGNOSIS — Z85118 Personal history of other malignant neoplasm of bronchus and lung: Secondary | ICD-10-CM | POA: Diagnosis not present

## 2016-02-07 DIAGNOSIS — C3491 Malignant neoplasm of unspecified part of right bronchus or lung: Secondary | ICD-10-CM

## 2016-02-07 NOTE — Progress Notes (Signed)
[facility] [phone]   Fax:([phone]) [facility], [address] [place] [address]  DIAGNOSIS: Limited stage small cell lung cancer diagnosed in January of 2013   PRIOR THERAPY:  1) Systemic chemotherapy with cisplatin at 60 mg per meter square given on day 1 and etoposide at 120 mg per meter square given on days one 2 and 3 with Neulasta support given on day 4 status post 4 cycles, last cycle was given on 12/02/2011 . This with concurrent with radiotherapy under the care of Dr. Lisbeth Renshaw.  2) prophylactic cranial irradiation under the care of Dr. Lisbeth Renshaw completed on 01/21/2012   CURRENT THERAPY: Observation.  INTERVAL HISTORY: [name] [name] 64 y.o. female returns to the clinic today for routine six-month follow up visit accompanied by her sister. She has been on observation for the last 4 years. She has no significant change since her last visit. She is doing fine today with no specific complaints. She denied having any significant weight loss or night sweats. She d enied having any chest pain, shortness of breath, cough or hemoptysis. She has no nausea or vomiting, no fever or chills. She had repeat CT scan of the chest performed recently and she is here for evaluation and discussion of her scan results.  MEDICAL HISTORY: Past Medical History  Diagnosis Date  . Hypertension   . High cholesterol   . Anxiety   . Osteoporosis 04/2014    Dexa scan by Dr. Corinna Capra, T-score -2.7, next in 2 years, with at least 13 % major fracture in 10 years  . Hx of radiation therapy 10/07/11 to 11/20/11    right lung  . Hx of radiation therapy 01/07/2012- 01/21/12    cranial irradiation  . Lung cancer ([place]) 09/19/11    Stage III currently in remission  . Cholelithiasis   . Colon polyps     adenomatous  . Bilateral lower extremity pain   . Pitting edema     Bilateral lower extremities, duplex ultrasound 04/03/2015 normal, no  DVT    ALLERGIES:  is allergic to codeine and penicillins.  MEDICATIONS:  Current Outpatient Prescriptions  Medication Sig Dispense Refill  . ALPRAZolam (XANAX) 0.5 MG tablet Take 1-2 tablets (0.5-1 mg total) by mouth daily as needed for anxiety. 40 tablet 0  . APPLE CIDER VINEGAR PO Take by mouth. 2 tbsp by mouth daily    . aspirin 81 MG tablet Take 81 mg by mouth daily.    . Cholecalciferol (VITAMIN D3) 2000 UNITS capsule Take 4,000 Units by mouth daily.     . citalopram (CELEXA) 40 MG tablet TAKE 1 TABLET BY MOUTH ONCE DAILY 30 tablet 1  . lisinopril (PRINIVIL,ZESTRIL) 20 MG tablet TAKE 1 TABLET BY MOUTH DAILY 30 tablet 2  . Loperamide HCl (IMODIUM PO) Take 3 mLs by mouth daily.    . raloxifene (EVISTA) 60 MG tablet 20 mg daily  12  . rosuvastatin (CRESTOR) 40 MG tablet Take 1 tablet (40 mg total) by mouth daily. 30 tablet 6  . [DISCONTINUED] sucralfate (CARAFATE) 1 G tablet Take 1 tablet (1 g total) by mouth 4 (four) times daily. Dissolve tablet in 15cc water. 120 tablet 1   No current facility-administered medications for this visit.    SURGICAL HISTORY:  Past Surgical History  Procedure Laterality Date  . Tubal ligation    . Cholecystectomy  04/2011    biliary stent  placement  . Incisional hernia repair  2015  . Mona lisa touch      Vaginal procedure    REVIEW OF SYSTEMS:  A comprehensive review of systems was negative.   PHYSICAL EXAMINATION: General appearance: alert, cooperative and no distress Head: Normocephalic, without obvious abnormality, atraumatic Neck: no adenopathy Lymph nodes: Cervical, supraclavicular, and axillary nodes normal. Resp: clear to auscultation bilaterally Cardio: regular rate and rhythm, S1, S2 normal, no murmur, click, rub or gallop GI: soft, non-tender; bowel sounds normal; no masses,  no organomegaly Extremities: extremities normal, atraumatic, no cyanosis or edema  ECOG PERFORMANCE STATUS: 0 - Asymptomatic  There were no vitals taken  for this visit.  LABORATORY DATA: Lab Results  Component Value Date   WBC 4.7 02/05/2016   HGB 12.5 02/05/2016   HCT 37.5 02/05/2016   MCV 92.6 02/05/2016   PLT 184 02/05/2016      Chemistry      Component Value Date/Time   NA 140 02/05/2016 0851   NA 132* 12/03/2012 1138   NA 133 03/23/2012 1305   K 4.5 02/05/2016 0851   K 4.0 12/03/2012 1138   K 4.4 03/23/2012 1305   CL 104 12/23/2012 1338   CL 99 12/03/2012 1138   CL 93* 03/23/2012 1305   CO2 23 02/05/2016 0851   CO2 26 12/03/2012 1138   CO2 25 03/23/2012 1305   BUN 14.7 02/05/2016 0851   BUN 13 12/03/2012 1138   BUN 10 03/23/2012 1305   CREATININE 1.0 02/05/2016 0851   CREATININE 0.9 12/03/2012 1138   CREATININE 1.0 03/23/2012 1305      Component Value Date/Time   CALCIUM 9.6 02/05/2016 0851   CALCIUM 9.2 12/03/2012 1138   CALCIUM 9.4 03/23/2012 1305   ALKPHOS 37* 02/05/2016 0851   ALKPHOS 50 04/01/2012 1417   ALKPHOS 48 03/23/2012 1305   AST 21 02/05/2016 0851   AST 19 11/22/2015 1017   AST 18 03/23/2012 1305   ALT 15 02/05/2016 0851   ALT 12 11/22/2015 1017   ALT 19 03/23/2012 1305   BILITOT 0.40 02/05/2016 0851   BILITOT 0.4 04/01/2012 1417   BILITOT 0.60 03/23/2012 1305       RADIOGRAPHIC STUDIES: Ct Chest W Contrast  02/05/2016  CLINICAL DATA:  Followup small cell lung cancer. EXAM: CT CHEST WITH CONTRAST TECHNIQUE: Multidetector CT imaging of the chest was performed during intravenous contrast administration. CONTRAST:  71m ISOVUE-300 IOPAMIDOL (ISOVUE-300) INJECTION 61% COMPARISON:  08/02/2015 FINDINGS: Mediastinum/Lymph Nodes: Normal heart size. Aortic atherosclerosis identified. Calcification within the LAD and left main coronary artery noted. The trachea appears patent and is midline. The esophagus appears normal. No mediastinal or hilar adenopathy. Lungs/Pleura: No pleural effusion. There is paramediastinal radiation change involving the right lung. This is unchanged from the previous exam. No  evidence for residual or recurrence of tumor. Upper abdomen: Hepatic steatosis noted. The adrenal glands are normal. Unremarkable appearance of the spleen. Previous cholecystectomy. Chronic increase caliber of the common bile duct noted. Musculoskeletal: No aggressive lytic or sclerotic bone lesion. IMPRESSION: 1. Stable CT of the chest. No evidence for residual or recurrence of tumor. 2. Similar appearance of radiation change within the right midlung. 3. Aortic atherosclerosis and coronary artery calcification. Electronically Signed   By: TKerby MoorsM.D.   On: 02/05/2016 11:03   ASSESSMENT AND PLAN: this is a very pleasant 65years old white female with limited stage small cell lung cancer status post systemic chemotherapy with cisplatin and etoposide concurrent with radiation  followed by prophylactic cranial irradiation.  She has been on observation since May 2013. The recent CT scan of the chest showed no evidence for disease recurrence. I discussed the scan results with the patient today. I recommended for her to come back for followup visit in one year with repeat CT scan of the chest. She was advised to call immediately if she has any concerning symptoms in the interval.  The patient voices understanding of current disease status and treatment options and is in agreement with the current care plan.  All questions were answered. The patient knows to call the clinic with any problems, questions or concerns. We can certainly see the patient much sooner if necessary.  Disclaimer: This note was dictated with voice recognition software. Similar sounding words can inadvertently be transcribed and may not be corrected upon review.

## 2016-03-17 ENCOUNTER — Other Ambulatory Visit: Payer: Self-pay

## 2016-03-17 MED ORDER — CITALOPRAM HYDROBROMIDE 40 MG PO TABS: 40.0000 mg | ORAL_TABLET | Freq: Every day | ORAL | 5 refills | Status: DC

## 2016-03-27 ENCOUNTER — Other Ambulatory Visit: Payer: Self-pay | Admitting: Obstetrics and Gynecology

## 2016-03-27 DIAGNOSIS — N63 Unspecified lump in unspecified breast: Secondary | ICD-10-CM

## 2016-04-29 ENCOUNTER — Telehealth: Payer: Self-pay

## 2016-04-29 NOTE — Telephone Encounter (Signed)
unfortunately, I'm leaving town for a few days. Recommend urgent care visit or a visit here with another provider

## 2016-04-29 NOTE — Telephone Encounter (Signed)
Please advise 

## 2016-04-29 NOTE — Telephone Encounter (Signed)
Patient is asking to coming in to be seen for a possible UTI she has severe pain at the end of urinating. She is aware provider does not have any openings but wants to know can she bring a urine sample for test for UTI. Please advise said she cannot wait until tomorrow. Call back number [phone] to be seen she said she has been going through this all weekend and its painful

## 2016-04-29 NOTE — Telephone Encounter (Signed)
Spoke w/ Pt, informed her PCP is leaving out of town tomorrow. Informed her I could schedule appt w/ another provider for tomorrow. Pt scheduled w/ Einar Pheasant 04/30/2016 at 1:15. Informed AZO can help w/ urinary pain until she can be seen. Pt verbalized understanding.

## 2016-04-30 ENCOUNTER — Encounter: Payer: Self-pay | Admitting: Physician Assistant

## 2016-04-30 ENCOUNTER — Ambulatory Visit (INDEPENDENT_AMBULATORY_CARE_PROVIDER_SITE_OTHER): Payer: BLUE CROSS/BLUE SHIELD | Admitting: Physician Assistant

## 2016-04-30 VITALS — BP 110/68 | HR 99 | Temp 98.1°F | Resp 16 | Ht 63.0 in | Wt 160.2 lb

## 2016-04-30 DIAGNOSIS — N3000 Acute cystitis without hematuria: Secondary | ICD-10-CM | POA: Diagnosis not present

## 2016-04-30 DIAGNOSIS — R399 Unspecified symptoms and signs involving the genitourinary system: Secondary | ICD-10-CM | POA: Diagnosis not present

## 2016-04-30 LAB — POCT URINALYSIS DIPSTICK
BILIRUBIN UA: POSITIVE
Blood, UA: POSITIVE
GLUCOSE UA: NEGATIVE
Ketones, UA: NEGATIVE
Nitrite, UA: POSITIVE
PH UA: 6
Protein, UA: POSITIVE
Spec Grav, UA: 1.02
Urobilinogen, UA: 2

## 2016-04-30 MED ORDER — CIPROFLOXACIN HCL 500 MG PO TABS: 500.0000 mg | ORAL_TABLET | Freq: Two times a day (BID) | ORAL | 0 refills | Status: DC

## 2016-04-30 MED ORDER — LISINOPRIL 20 MG PO TABS: 20.0000 mg | ORAL_TABLET | Freq: Every day | ORAL | 2 refills | Status: DC

## 2016-04-30 NOTE — Patient Instructions (Signed)
Your symptoms are consistent with a bladder infection, also called acute cystitis. Please take your antibiotic (Cipro) as directed until all pills are gone.  Stay very well hydrated.  Consider a daily probiotic (Align, Culturelle, or Activia) to help prevent stomach upset caused by the antibiotic.  Taking a probiotic daily may also help prevent recurrent UTIs.  Also consider taking AZO (Phenazopyridine) tablets to help decrease pain with urination.  I will call you with your urine testing results.  We will change antibiotics if indicated.  Call or return to clinic if symptoms are not resolved by completion of antibiotic.   Urinary Tract Infection A urinary tract infection (UTI) can occur any place along the urinary tract. The tract includes the kidneys, ureters, bladder, and urethra. A type of germ called bacteria often causes a UTI. UTIs are often helped with antibiotic medicine.  HOME CARE   If given, take antibiotics as told by your doctor. Finish them even if you start to feel better.  Drink enough fluids to keep your pee (urine) clear or pale yellow.  Avoid tea, drinks with caffeine, and bubbly (carbonated) drinks.  Pee often. Avoid holding your pee in for a long time.  Pee before and after having sex (intercourse).  Wipe from front to back after you poop (bowel movement) if you are a woman. Use each tissue only once. GET HELP RIGHT AWAY IF:   You have back pain.  You have lower belly (abdominal) pain.  You have chills.  You feel sick to your stomach (nauseous).  You throw up (vomit).  Your burning or discomfort with peeing does not go away.  You have a fever.  Your symptoms are not better in 3 days. MAKE SURE YOU:   Understand these instructions.  Will watch your condition.  Will get help right away if you are not doing well or get worse. Document Released: 01/28/2008 Document Revised: 05/05/2012 Document Reviewed: 03/11/2012 Exit[employer] Patient Information 2015  [employer]. This information is not intended to replace advice given to you by your health care provider. Make sure you discuss any questions you have with your health care provider.    

## 2016-04-30 NOTE — Progress Notes (Signed)
GGY:IRSW [name] Kells, MD Chief Complaint  Patient presents with  . Urinary Tract Infection    Pt c/o Urinary frequency, pain with urination, feeling of not emptying bladder, urinary urgency x1 day    Current Issues:  Presents with 2 days of dysuria and urinary urgency Associated symptoms include:  urinary frequency and suprapubic pressure  There is a previous history of of similar symptoms. Sexually active:  No   No concern for STI.  Prior to Admission medications   Medication Sig Start Date End Date Taking? Authorizing Provider  ALPRAZolam Duanne Moron) 0.5 MG tablet Take 1-2 tablets (0.5-1 mg total) by mouth daily as needed for anxiety. 02/14/15  Yes Colon Branch, MD  aspirin 81 MG tablet Take 81 mg by mouth daily.   Yes Historical Provider, MD  Cholecalciferol (VITAMIN D3) 2000 UNITS capsule Take 4,000 Units by mouth daily.    Yes Historical Provider, MD  citalopram (CELEXA) 40 MG tablet Take 1 tablet (40 mg total) by mouth daily. 03/17/16  Yes Colon Branch, MD  lisinopril (PRINIVIL,ZESTRIL) 20 MG tablet TAKE 1 TABLET BY MOUTH DAILY 12/20/15  Yes Colon Branch, MD  Loperamide HCl (IMODIUM PO) Take 3 mLs by mouth daily. Reported on 02/07/2016   Yes Historical Provider, MD  Phenazopyridine HCl (AZO-STANDARD PO) Take by mouth daily as needed.   Yes Historical Provider, MD  raloxifene (EVISTA) 60 MG tablet 20 mg daily 01/03/15  Yes Historical Provider, MD  rosuvastatin (CRESTOR) 40 MG tablet Take 1 tablet (40 mg total) by mouth daily. 11/26/15  Yes Colon Branch, MD  APPLE CIDER VINEGAR PO Take by mouth. 2 tbsp by mouth daily    Historical Provider, MD    Review of Systems:Pertient ROS in the HPI.  PE:  BP 110/68 (BP Location: Left Arm, Patient Position: Sitting, Cuff Size: Large)   Pulse 99   Temp 98.1 F (36.7 C) (Oral)   Resp 16   Ht '5\' 3"'$  (1.6 m)   Wt 160 lb 4 oz (72.7 kg)   SpO2 98%   BMI 28.39 kg/m   Results for orders placed or performed in visit on 02/05/16  Comprehensive metabolic panel  Result  Value Ref Range   Sodium 140 136 - 145 mEq/L   Potassium 4.5 3.5 - 5.1 mEq/L   Chloride 108 98 - 109 mEq/L   CO2 23 22 - 29 mEq/L   Glucose 97 70 - 140 mg/dl   BUN 14.7 7.0 - 26.0 mg/dL   Creatinine 1.0 0.6 - 1.1 mg/dL   Total Bilirubin 0.40 0.20 - 1.20 mg/dL   Alkaline Phosphatase 37 (L) 40 - 150 U/L   AST 21 5 - 34 U/L   ALT 15 0 - 55 U/L   Total Protein 6.9 6.4 - 8.3 g/dL   Albumin 4.0 3.5 - 5.0 g/dL   Calcium 9.6 8.4 - 10.4 mg/dL   Anion Gap 9 3 - 11 mEq/L   EGFR 64 (L) >90 ml/min/1.73 m2  CBC with Differential/Platelet  Result Value Ref Range   WBC 4.7 3.9 - 10.3 10e3/uL   NEUT# 3.5 1.5 - 6.5 10e3/uL   HGB 12.5 11.6 - 15.9 g/dL   HCT 37.5 34.8 - 46.6 %   Platelets 184 145 - 400 10e3/uL   MCV 92.6 79.5 - 101.0 fL   MCH 30.8 25.1 - 34.0 pg   MCHC 33.2 31.5 - 36.0 g/dL   RBC 4.05 3.70 - 5.45 10e6/uL   RDW 12.6 11.2 - 14.5 %  lymph# 0.5 (L) 0.9 - 3.3 10e3/uL   MONO# 0.6 0.1 - 0.9 10e3/uL   Eosinophils Absolute 0.1 0.0 - 0.5 10e3/uL   Basophils Absolute 0.0 0.0 - 0.1 10e3/uL   NEUT% 74.1 38.4 - 76.8 %   LYMPH% 11.0 (L) 14.0 - 49.7 %   MONO% 11.8 0.0 - 14.0 %   EOS% 2.6 0.0 - 7.0 %   BASO% 0.5 0.0 - 2.0 %    Assessment and Plan:  1. UTI symptoms  1. Acute cystitis without hematuria Rx Cipro as UA with + LE and nitrites. Supportive measures review. Will alter regimen based on culture.  - ciprofloxacin (CIPRO) 500 MG tablet; Take 1 tablet (500 mg total) by mouth 2 (two) times daily.  Dispense: 10 tablet; Refill: 0  - POCT urinalysis dipstick - CULTURE, URINE COMPREHENSIVE

## 2016-05-01 ENCOUNTER — Other Ambulatory Visit: Payer: Self-pay | Admitting: Internal Medicine

## 2016-05-01 NOTE — Telephone Encounter (Signed)
Cipro was filled to [facility] yesterday. Pt can pick up medication there, or call [facility] to request they transfer rx.

## 2016-05-01 NOTE — Telephone Encounter (Signed)
Patient was advised that office would have to call pharmacy directly.

## 2016-05-01 NOTE — Telephone Encounter (Signed)
°  Relation to YO:FVWA Call back [place], [place], [place] [address] [phone]  Reason for call:  Patient requesting ciprofloxacin (CIPRO) 500 MG tablet  please send to [facility], [place], [place] [address] [phone]

## 2016-05-02 LAB — CULTURE, URINE COMPREHENSIVE

## 2016-05-02 NOTE — Telephone Encounter (Signed)
Notified pt and advised of  E. Coli related UTI. Pt voices understanding.

## 2016-05-02 NOTE — Telephone Encounter (Signed)
We are awaiting results of urine culture. Pt will be contacted once culture results are final.

## 2016-05-02 NOTE — Telephone Encounter (Signed)
Pt called in to follow up. Informed her that she could call [facility] and have Rx transferred to [facility]. Pt says that she will call pharmacy. Provided phone # and also transferred pt downstairs.    ALSO, pt would like a call back for her lab results.    Please advise.   Thanks.

## 2016-05-07 ENCOUNTER — Other Ambulatory Visit: Payer: Self-pay | Admitting: Internal Medicine

## 2016-05-21 ENCOUNTER — Ambulatory Visit
Admission: RE | Admit: 2016-05-21 | Discharge: 2016-05-21 | Disposition: A | Discharge status: Home / Self Care | Payer: BLUE CROSS/BLUE SHIELD | Source: Ambulatory Visit | Attending: Obstetrics and Gynecology | Admitting: Obstetrics and Gynecology

## 2016-05-21 DIAGNOSIS — N63 Unspecified lump in unspecified breast: Secondary | ICD-10-CM

## 2016-05-21 LAB — HM DEXA SCAN

## 2016-07-11 IMAGING — DX DG FACIAL BONES COMPLETE 3+V
3 series · 3 of 3 positions shown · non-contrast
Comparison: None

CLINICAL DATA: Fell hitting the face and forehead several days ago
with tenderness and swelling

EXAM:
FACIAL BONES COMPLETE 3+V

[facial pa townes]
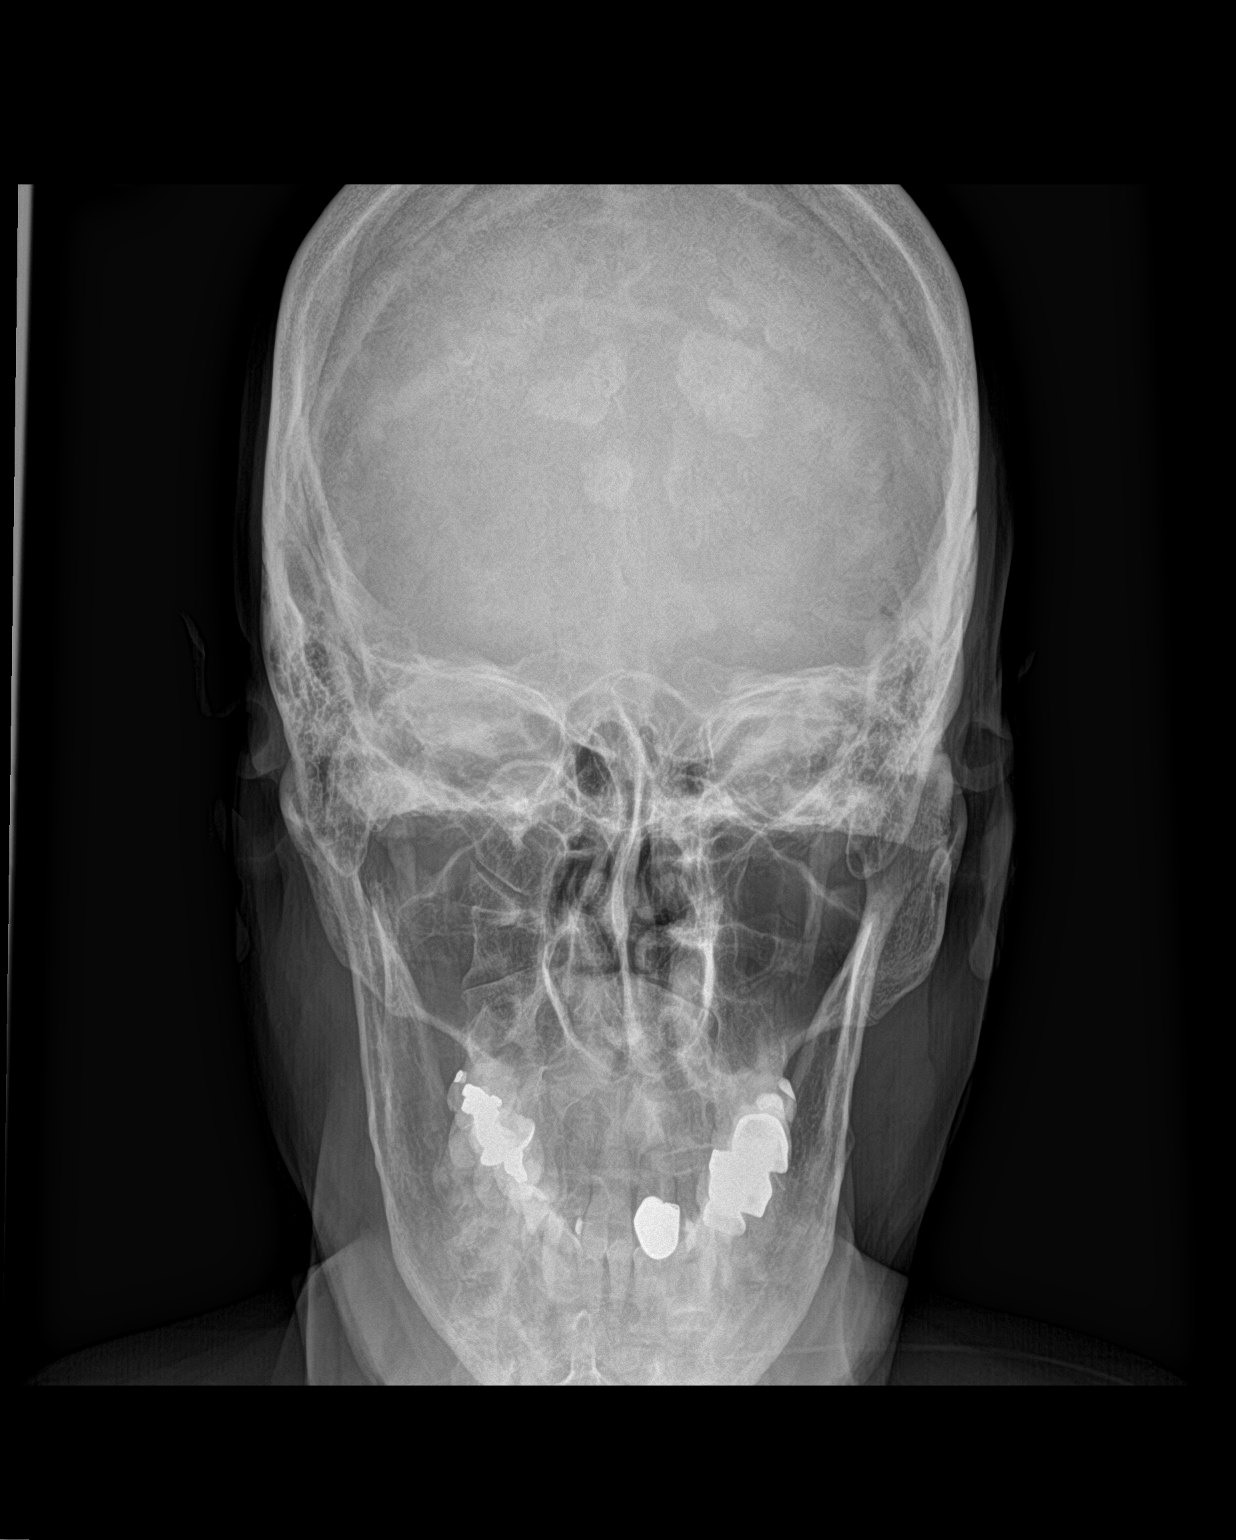

[facial waters]
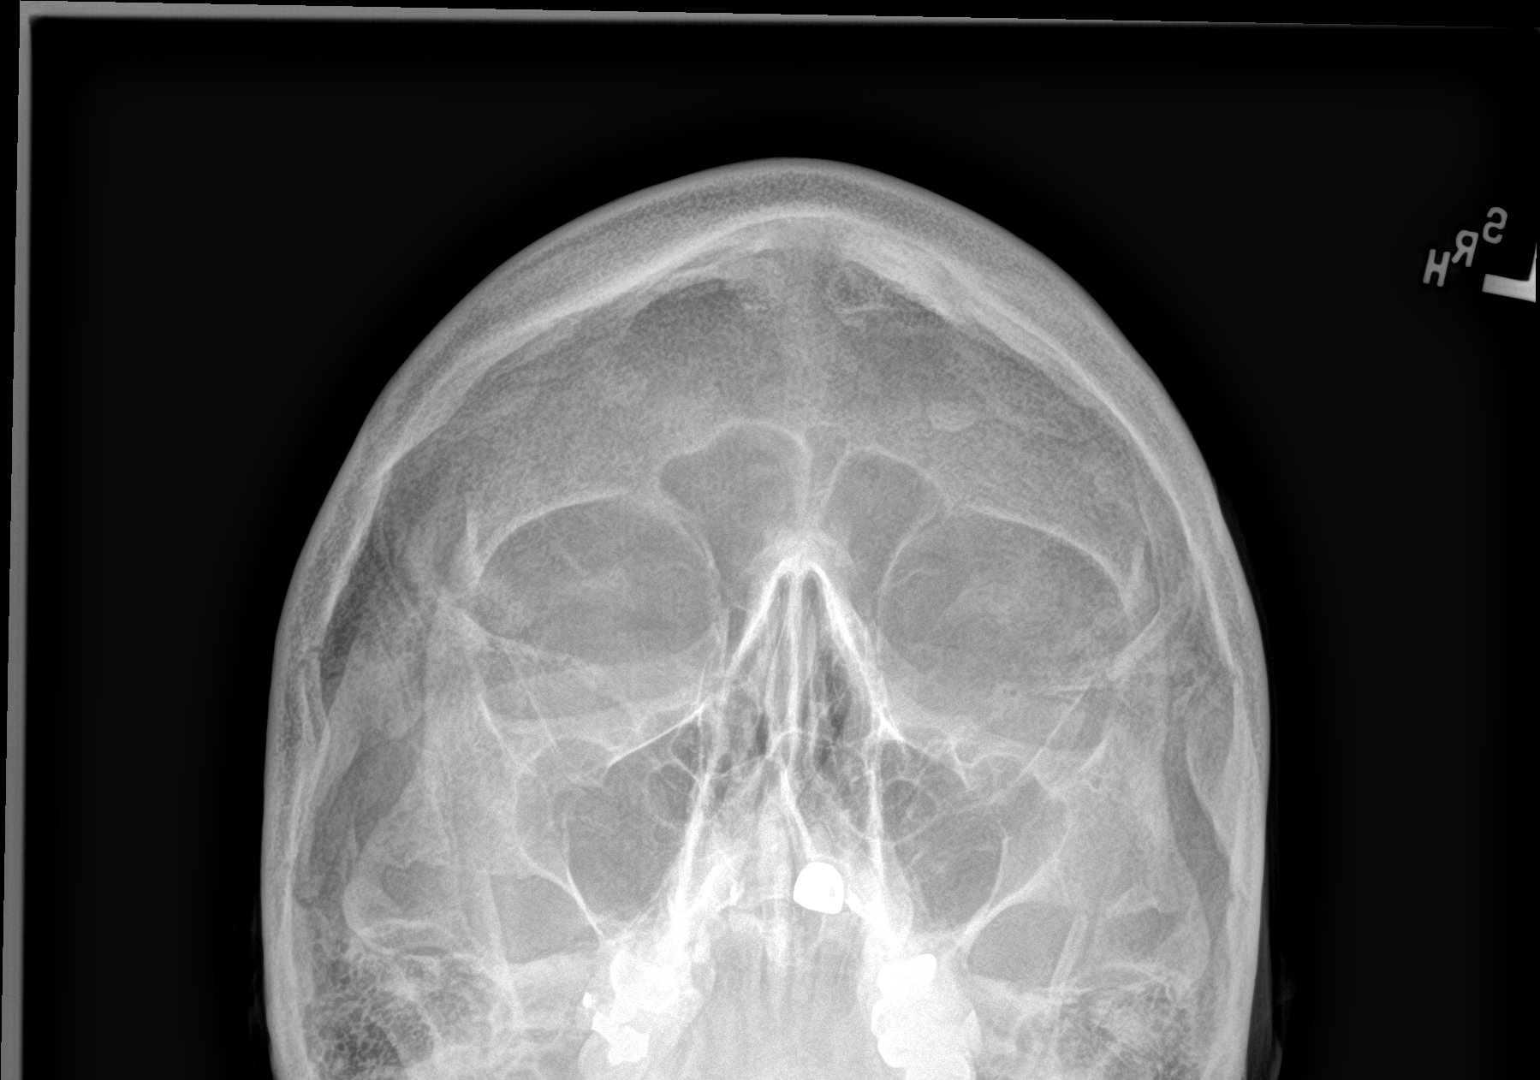

[facial lateral]
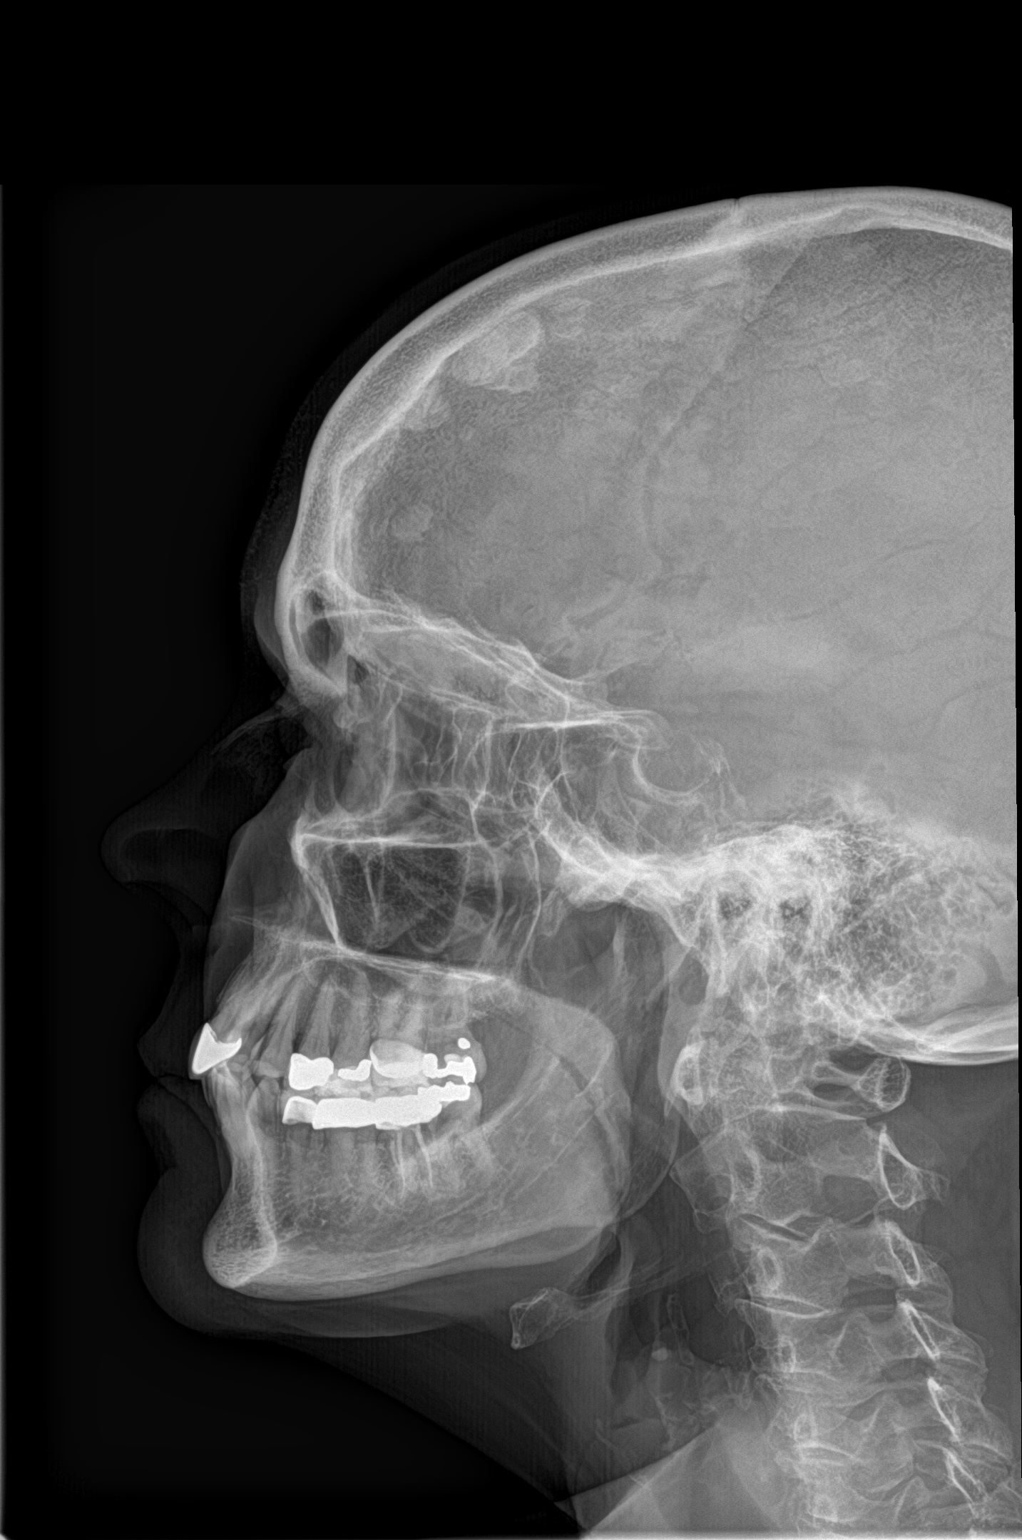

[3 of 3 positions shown; findings below may reference images not displayed]

FINDINGS: No maxillofacial fracture is seen. No periorbital air is noted. The
orbital rims appear intact. The paranasal sinuses are clear.
IMPRESSION: No maxillofacial fracture is seen.

## 2016-07-21 ENCOUNTER — Ambulatory Visit (INDEPENDENT_AMBULATORY_CARE_PROVIDER_SITE_OTHER): Payer: BLUE CROSS/BLUE SHIELD | Admitting: Internal Medicine

## 2016-07-21 ENCOUNTER — Encounter: Payer: Self-pay | Admitting: Internal Medicine

## 2016-07-21 VITALS — BP 138/80 | HR 100 | Temp 98.2°F | Ht 63.0 in | Wt 162.2 lb

## 2016-07-21 DIAGNOSIS — I10 Essential (primary) hypertension: Secondary | ICD-10-CM

## 2016-07-21 DIAGNOSIS — E538 Deficiency of other specified B group vitamins: Secondary | ICD-10-CM

## 2016-07-21 DIAGNOSIS — Z23 Encounter for immunization: Secondary | ICD-10-CM

## 2016-07-21 DIAGNOSIS — E785 Hyperlipidemia, unspecified: Secondary | ICD-10-CM

## 2016-07-21 DIAGNOSIS — F418 Other specified anxiety disorders: Secondary | ICD-10-CM | POA: Diagnosis not present

## 2016-07-21 MED ORDER — ALPRAZOLAM 0.5 MG PO TABS: 0.5000 mg | ORAL_TABLET | Freq: Every day | ORAL | 2 refills | Status: DC | PRN

## 2016-07-21 NOTE — Patient Instructions (Signed)
GO TO THE LAB : Get the blood work     GO TO THE FRONT DESK Schedule your next appointment for a  complete physical exam by 10-2016   Check the  blood pressure 2 or 3 times a  week   Be sure your blood pressure is between 110/65 and  145/85. If it is consistently higher or lower, let me know

## 2016-07-21 NOTE — Progress Notes (Signed)
Pre visit review using our clinic review tool, if applicable. No additional management support is needed unless otherwise documented below in the visit note. 

## 2016-07-21 NOTE — Progress Notes (Signed)
Subjective:    Patient ID: [name] [name], female    DOB: [birth year], 65 y.o.   MRN: [id number]  DOS:  07/21/2016 Type of visit - description : rov Interval history: High cholesterol: Good compliance with Crestor. HTN: Good med compliance, no ambulatory BPs, BP today is moderately elevated B12 deficiency: on no supplements by mouth Chronic diarrhea: Still issue however no problems as long as she takes Imodium.  Review of Systems  Denies chest pain, difficulty breathing, no lower extremity edema. No cough or wheezing Diet: Needs to improve.   Past Medical History:  Diagnosis Date  . Anxiety   . Bilateral lower extremity pain   . Cholelithiasis   . Colon polyps    adenomatous  . High cholesterol   . Hx of radiation therapy 10/07/11 to 11/20/11   right lung  . Hx of radiation therapy 01/07/2012- 01/21/12   cranial irradiation  . Hypertension   . Lung cancer ([place]) 09/19/11   Stage III currently in remission  . Osteoporosis 04/2014   Dexa scan by Dr. Corinna Capra, T-score -2.7, next in 2 years, with at least 13 % major fracture in 10 years  . Pitting edema    Bilateral lower extremities, duplex ultrasound 04/03/2015 normal, no DVT    Past Surgical History:  Procedure Laterality Date  . CHOLECYSTECTOMY  04/2011   biliary stent placement  . [place]  2015  . Josph Macho Touch     Vaginal procedure  . TUBAL LIGATION      Social History   Social History  . Marital status: Divorced    Spouse name: N/A  . Number of children: 2  . Years of education: N/A   Occupational History  . retired    Social History Main Topics  . Smoking status: Former Smoker    Types: Cigarettes    Quit date: 09/08/2011  . Smokeless tobacco: Never Used  . Alcohol use Yes     Comment: occasional  . Drug use: No  . Sexual activity: Not on file   Other Topics Concern  . Not on file   Social History Narrative   divorced, 2 kids, lives by herself, has a boyfriend                     Medication List       Accurate as of 07/21/16  2:03 PM. Always use your most recent med list.          ALPRAZolam 0.5 MG tablet Commonly known as:  XANAX Take 1-2 tablets (0.5-1 mg total) by mouth daily as needed for anxiety.   APPLE CIDER VINEGAR PO Take by mouth. 2 tbsp by mouth daily   aspirin 81 MG tablet Take 81 mg by mouth daily.   citalopram 40 MG tablet Commonly known as:  CELEXA Take 1 tablet (40 mg total) by mouth daily.   IMODIUM PO Take 3 mLs by mouth daily. Reported on 02/07/2016   lisinopril 20 MG tablet Commonly known as:  PRINIVIL,ZESTRIL Take 1 tablet (20 mg total) by mouth daily.   raloxifene 60 MG tablet Commonly known as:  EVISTA 20 mg daily   rosuvastatin 20 MG tablet Commonly known as:  CRESTOR Take 1 tablet (20 mg total) by mouth daily.   Vitamin D3 2000 units capsule Take 4,000 Units by mouth daily.          Objective:   Physical Exam BP (!) 146/95 (BP Location: Right Arm, Patient Position:  Sitting, Cuff Size: Normal)   Pulse 100   Temp 98.2 F (36.8 C) (Oral)   Ht '5\' 3"'$  (1.6 m)   Wt 162 lb 3.2 oz (73.6 kg)   SpO2 99% Comment: RA  BMI 28.73 kg/m  General:   Well developed, well nourished . NAD.  HEENT:  Normocephalic . Face symmetric, atraumatic Lungs:  CTA B Normal respiratory effort, no intercostal retractions, no accessory muscle use. Heart: RRR,  no murmur.  No pretibial edema bilaterally  Skin: Not pale. Not jaundice Neurologic:  alert & oriented X3.  Speech normal, gait appropriate for age and unassisted Psych--  Cognition and judgment appear intact.  Cooperative with normal attention span and concentration.  Behavior appropriate. No anxious or depressed appearing.      Assessment & Plan:    Assessment > HTN Hyperlipidemia -- cramps with Lipitor dc 2015 Depression . Anxiety -- on citalopram, rarely uses Xanax Osteoporosis-- per DEXA at gyn 05-10-2014 --->  was on boniva, now evista  B12  deficiency dx 01/2014 Vit D  def  COPD: Mild last PFTs  Lung cancer SCC DX 2013, XRT chest and brain Lower extremity edema [place] (-) for DVT 03-2015 Coronary calcifications per CT chest 6 2016 IBS, diarrhea predominant   on Imodium as needed; GI 2015-- rx questran, self d/c d/t constipation ASA intolerant (tinnitus)   PLAN: HTN: Check a BMP, BP elevated today upon arrival, recheck BP 130/80. Recommend to continue with lisinopril check at home. See instructions Hyperlipidemia: Based on last cholesterol panel, Crestor dose increase. Recheck labs. Improve diet. Depression anxiety: Currently under excellent control, refill Xanax.  IBS: Chronic diarrhea, well controlled as long as she use Imodium. She will consider very low-dose of Questran. B12 deficiency: Currently taking no supplements, recommend to do that. Coronary calcifications on CT scan: We have been unable to schedule a stress test so far. Primary care: Flu shot today RTC 10-2016

## 2016-07-22 LAB — LIPID PANEL
CHOL/HDL RATIO: 3
CHOLESTEROL: 306 mg/dL — AB (ref 0–200)
HDL: 95.2 mg/dL (ref >39.00)
LDL CALC: 187 mg/dL — AB (ref 0–99)
NonHDL: 211.09
TRIGLYCERIDES: 119 mg/dL (ref 0.0–149.0)
VLDL: 23.8 mg/dL (ref 0.0–40.0)

## 2016-07-22 LAB — BASIC METABOLIC PANEL
BUN: 19 mg/dL (ref 6–23)
CALCIUM: 9.7 mg/dL (ref 8.4–10.5)
CO2: 26 mEq/L (ref 19–32)
CREATININE: 0.94 mg/dL (ref 0.40–1.20)
Chloride: 101 mEq/L (ref 96–112)
GFR: 63.83 mL/min (ref >60.00)
Glucose, Bld: 68 mg/dL — ABNORMAL LOW (ref 70–99)
Potassium: 4.5 mEq/L (ref 3.5–5.1)
Sodium: 137 mEq/L (ref 135–145)

## 2016-07-22 LAB — ALT: ALT: 14 U/L (ref 0–35)

## 2016-07-22 LAB — AST: AST: 26 U/L (ref 0–37)

## 2016-07-22 NOTE — Assessment & Plan Note (Signed)
HTN: Check a BMP, BP elevated today upon arrival, recheck BP 130/80. Recommend to continue with lisinopril check at home. See instructions Hyperlipidemia: Based on last cholesterol panel, Crestor dose increase. Recheck labs. Improve diet. Depression anxiety: Currently under excellent control, refill Xanax.  IBS: Chronic diarrhea, well controlled as long as she use Imodium. She will consider very low-dose of Questran. B12 deficiency: Currently taking no supplements, recommend to do that. Coronary calcifications on CT scan: We have been unable to schedule a stress test so far. Primary care: Flu shot today RTC 10-2016

## 2016-07-25 MED ORDER — ROSUVASTATIN CALCIUM 40 MG PO TABS: 40.0000 mg | ORAL_TABLET | Freq: Every day | ORAL | 2 refills | Status: DC

## 2016-07-25 MED ORDER — EZETIMIBE 10 MG PO TABS: 10.0000 mg | ORAL_TABLET | Freq: Every day | ORAL | 2 refills | Status: DC

## 2016-07-25 NOTE — Addendum Note (Signed)
Addended byDamita Dunnings D on: 07/25/2016 02:51 PM   Modules accepted: Orders

## 2016-07-31 ENCOUNTER — Ambulatory Visit (INDEPENDENT_AMBULATORY_CARE_PROVIDER_SITE_OTHER): Payer: BLUE CROSS/BLUE SHIELD | Admitting: Medical

## 2016-07-31 ENCOUNTER — Encounter: Payer: Self-pay | Admitting: Medical

## 2016-07-31 VITALS — BP 110/70 | HR 78 | Temp 98.1°F | Ht 63.0 in | Wt 161.0 lb

## 2016-07-31 DIAGNOSIS — J029 Acute pharyngitis, unspecified: Secondary | ICD-10-CM

## 2016-07-31 DIAGNOSIS — R05 Cough: Secondary | ICD-10-CM

## 2016-07-31 DIAGNOSIS — R0981 Nasal congestion: Secondary | ICD-10-CM | POA: Diagnosis not present

## 2016-07-31 DIAGNOSIS — R059 Cough, unspecified: Secondary | ICD-10-CM

## 2016-07-31 LAB — POCT RAPID STREP A (OFFICE): Rapid Strep A Screen: NEGATIVE

## 2016-07-31 MED ORDER — BENZONATATE 100 MG PO CAPS: 100.0000 mg | ORAL_CAPSULE | Freq: Three times a day (TID) | ORAL | 0 refills | Status: DC | PRN

## 2016-07-31 MED ORDER — DOXYCYCLINE HYCLATE 100 MG PO TABS: 100.0000 mg | ORAL_TABLET | Freq: Two times a day (BID) | ORAL | 0 refills | Status: DC

## 2016-07-31 MED ORDER — FLUTICASONE PROPIONATE 50 MCG/ACT NA SUSP: 2.0000 | Freq: Every day | NASAL | 1 refills | Status: DC

## 2016-07-31 MED FILL — FLUTICASONE PROP 50 MCG SPR: 50 | 30 days supply | Qty: 16 | Fill #0

## 2016-07-31 MED FILL — BENZONATATE 100 MG CAPSULE: 100 | 7 days supply | Qty: 21 | Fill #0

## 2016-07-31 MED FILL — DOXYCYCLINE HYCLATE 100 MG: 100 | 7 days supply | Qty: 14 | Fill #0

## 2016-07-31 NOTE — Patient Instructions (Addendum)
Your rapid strep is negative and I think presently you have viral pharyngitis. But if your sore throat worsens then start doxycycline.   For nasal congestion rx flonase and for cough rx benzonatate.  Rest and hydrate.  Follow up in 7 days or as needed

## 2016-07-31 NOTE — Progress Notes (Signed)
Subjective:    Patient ID: [name] [name], female    DOB: March 01, 1951, 65 y.o.   MRN: [id number]  HPI   Pt for recent sore throat moderate to severe pain this am. But now no pain. Sister was sick and she had exposure to her recently. Pt sister is not better and on antibiotic. She reports heading out of town.  Pt has mild hoarse voice.   Pt has no fever, no chills and no sweats.   Pt states does not feel real congested.  Pt had some mild cough.      Review of Systems  Constitutional: Negative for chills, fatigue and fever.  HENT: Positive for congestion and voice change. Negative for postnasal drip, rhinorrhea, sinus pain, sinus pressure and sneezing.   Respiratory: Positive for cough. Negative for chest tightness, shortness of breath and wheezing.   Cardiovascular: Negative for chest pain and palpitations.  Gastrointestinal: Negative for abdominal pain.  Musculoskeletal: Negative for myalgias, neck pain and neck stiffness.  Skin: Negative for rash.  Hematological: Negative for adenopathy. Does not bruise/bleed easily.    Past Medical History:  Diagnosis Date  . Anxiety   . Bilateral lower extremity pain   . Cholelithiasis   . Colon polyps    adenomatous  . High cholesterol   . Hx of radiation therapy 10/07/11 to 11/20/11   right lung  . Hx of radiation therapy 01/07/2012- 01/21/12   cranial irradiation  . Hypertension   . Lung cancer ([place]) 09/19/11   Stage III currently in remission  . Osteoporosis 04/2014   Dexa scan by Dr. Corinna Capra, T-score -2.7, next in 2 years, with at least 13 % major fracture in 10 years  . Pitting edema    Bilateral lower extremities, duplex ultrasound 04/03/2015 normal, no DVT     Social History   Social History  . Marital status: Divorced    Spouse name: N/A  . Number of children: 2  . Years of education: N/A   Occupational History  . retired    Social History Main Topics  . Smoking status: Former Smoker    Types: Cigarettes   Quit date: 09/08/2011  . Smokeless tobacco: Never Used  . Alcohol use Yes     Comment: occasional  . Drug use: No  . Sexual activity: Not on file   Other Topics Concern  . Not on file   Social History Narrative   divorced, 2 kids, lives by herself, has a boyfriend                  Past Surgical History:  Procedure Laterality Date  . CHOLECYSTECTOMY  04/2011   biliary stent placement  . [place]  2015  . Josph Macho Touch     Vaginal procedure  . TUBAL LIGATION      Family History  Problem Relation Age of Onset  . Bladder Cancer Father   . Stroke Father   . Colon polyps Father   . Atrial fibrillation Mother   . Heart disease Mother     CHF  . Clotting disorder Mother     warfarin  . Colon polyps Mother   . Colon cancer Maternal Aunt 36  . Colon polyps Sister   . Breast cancer Neg Hx     Allergies  Allergen Reactions  . Codeine Nausea And Vomiting  . Penicillins Rash    Current Outpatient Prescriptions on File Prior to Visit  Medication Sig Dispense Refill  . ALPRAZolam (  XANAX) 0.5 MG tablet Take 1-2 tablets (0.5-1 mg total) by mouth daily as needed for anxiety. 40 tablet 2  . APPLE CIDER VINEGAR PO Take by mouth. 2 tbsp by mouth daily    . aspirin 81 MG tablet Take 81 mg by mouth daily.    . Cholecalciferol (VITAMIN D3) 2000 UNITS capsule Take 4,000 Units by mouth daily.     . citalopram (CELEXA) 40 MG tablet Take 1 tablet (40 mg total) by mouth daily. 30 tablet 5  . ezetimibe (ZETIA) 10 MG tablet Take 1 tablet (10 mg total) by mouth daily. 30 tablet 2  . lisinopril (PRINIVIL,ZESTRIL) 20 MG tablet Take 1 tablet (20 mg total) by mouth daily. 30 tablet 2  . Loperamide HCl (IMODIUM PO) Take 3 mLs by mouth daily. Reported on 02/07/2016    . raloxifene (EVISTA) 60 MG tablet 20 mg daily  12  . rosuvastatin (CRESTOR) 40 MG tablet Take 1 tablet (40 mg total) by mouth at bedtime. 30 tablet 2  . [DISCONTINUED] sucralfate (CARAFATE) 1 G tablet Take 1  tablet (1 g total) by mouth 4 (four) times daily. Dissolve tablet in 15cc water. 120 tablet 1   No current facility-administered medications on file prior to visit.     BP 110/70 (BP Location: Left Arm, Patient Position: Sitting, Cuff Size: Normal)   Pulse 78   Temp 98.1 F (36.7 C) (Oral)   Ht '5\' 3"'$  (1.6 m)   Wt 161 lb (73 kg)   SpO2 98%   BMI 28.52 kg/m      Objective:   Physical Exam  General  Mental Status - Alert. General Appearance - Well groomed. Not in acute distress.  Skin Rashes- No Rashes.  HEENT Head- Normal. Ear Auditory Canal - Left- Normal. Right - Normal.Tympanic Membrane- Left- Normal. Right- Normal. Eye Sclera/Conjunctiva- Left- Normal. Right- Normal. Nose & Sinuses Nasal Mucosa- Left-  Boggy and Congested. Right-  Boggy and  Congested.Bilateral no maxillary and no  frontal sinus pressure. Mouth & Throat Lips: Upper Lip- Normal: no dryness, cracking, pallor, cyanosis, or vesicular eruption. Lower Lip-Normal: no dryness, cracking, pallor, cyanosis or vesicular eruption. Buccal Mucosa- Bilateral- No Aphthous ulcers. Oropharynx- No Discharge or Erythema. Tonsils: Characteristics- Bilateral- No Erythema or Congestion. Size/Enlargement- Bilateral- No enlargement. Discharge- bilateral-None.  Neck Neck- Supple. No Masses.   Chest and Lung Exam Auscultation: Breath Sounds:-Clear even and unlabored.  Cardiovascular Auscultation:Rythm- Regular, rate and rhythm. Murmurs & Other Heart Sounds:Ausculatation of the heart reveal- No Murmurs.  Lymphatic Head & Neck General Head & Neck Lymphatics: Bilateral: Description- No Localized lymphadenopathy.       Assessment & Plan:  Your rapid strep is negative and I think presently you have viral pharyngitis. But if your sore throat worsens then start doxycycline.  For nasal congestion rx flonase and for cough rx benzonatate.  Rest and hydrate.  Follow up in 7 days or as needed  Note doxy not my first  choice but med interaction with azithromycin and allergy to pcn.  Shatavia Santor, Percell Miller, PA-C

## 2016-07-31 NOTE — Progress Notes (Signed)
Pre visit review using our clinic review tool, if applicable. No additional management support is needed unless otherwise documented below in the visit note. 

## 2016-08-01 ENCOUNTER — Telehealth: Payer: Self-pay | Admitting: Internal Medicine

## 2016-08-01 NOTE — Telephone Encounter (Signed)
I spoke with patient and advised her that per Oletta Lamas recommendation she would need to take food with the doxycycline. She voices understanding and did not have any further questions.

## 2016-08-01 NOTE — Telephone Encounter (Signed)
Caller name: Relationship to patient: Self Can be reached: [phone] Pharmacy:  Reason for call: Patient request call back from [facility] to find out which medication he told her would make her sick if she did not eat before taking.

## 2016-08-19 ENCOUNTER — Other Ambulatory Visit: Payer: Self-pay | Admitting: Nurse Practitioner

## 2016-09-11 ENCOUNTER — Other Ambulatory Visit: Payer: Self-pay | Admitting: Physician Assistant

## 2016-10-08 ENCOUNTER — Telehealth: Payer: Self-pay | Admitting: Internal Medicine

## 2016-10-08 NOTE — Telephone Encounter (Signed)
Due for an FLP, AST, ALT, DX high cholesterol. Please enter the order, call the patient --- needs  an appointment for blood work only

## 2016-10-08 NOTE — Telephone Encounter (Signed)
thx

## 2016-10-08 NOTE — Telephone Encounter (Signed)
Appt 10/09/2016 for labs.

## 2016-10-09 ENCOUNTER — Other Ambulatory Visit (INDEPENDENT_AMBULATORY_CARE_PROVIDER_SITE_OTHER): Payer: BLUE CROSS/BLUE SHIELD

## 2016-10-09 DIAGNOSIS — E785 Hyperlipidemia, unspecified: Secondary | ICD-10-CM

## 2016-10-09 LAB — LIPID PANEL
CHOL/HDL RATIO: 3
Cholesterol: 225 mg/dL — ABNORMAL HIGH (ref 0–200)
HDL: 78.2 mg/dL (ref >39.00)
LDL CALC: 120 mg/dL — AB (ref 0–99)
NONHDL: 146.73
TRIGLYCERIDES: 133 mg/dL (ref 0.0–149.0)
VLDL: 26.6 mg/dL (ref 0.0–40.0)

## 2016-10-09 LAB — ALT: ALT: 24 U/L (ref 0–35)

## 2016-10-09 LAB — AST: AST: 31 U/L (ref 0–37)

## 2016-11-27 ENCOUNTER — Other Ambulatory Visit: Payer: Self-pay | Admitting: Internal Medicine

## 2016-11-28 ENCOUNTER — Other Ambulatory Visit: Payer: Self-pay

## 2016-11-28 DIAGNOSIS — E785 Hyperlipidemia, unspecified: Secondary | ICD-10-CM

## 2016-11-28 MED ORDER — EZETIMIBE 10 MG PO TABS: 10.0000 mg | ORAL_TABLET | Freq: Every day | ORAL | 0 refills | Status: DC

## 2016-11-28 MED ORDER — ROSUVASTATIN CALCIUM 40 MG PO TABS: 40.0000 mg | ORAL_TABLET | Freq: Every day | ORAL | 0 refills | Status: DC

## 2016-12-25 ENCOUNTER — Encounter: Payer: Self-pay | Admitting: Internal Medicine

## 2016-12-25 ENCOUNTER — Ambulatory Visit (INDEPENDENT_AMBULATORY_CARE_PROVIDER_SITE_OTHER): Payer: BLUE CROSS/BLUE SHIELD | Admitting: Internal Medicine

## 2016-12-25 VITALS — BP 124/62 | HR 78 | Temp 98.0°F | Resp 14 | Ht 63.0 in | Wt 160.5 lb

## 2016-12-25 DIAGNOSIS — E7800 Pure hypercholesterolemia, unspecified: Secondary | ICD-10-CM | POA: Diagnosis not present

## 2016-12-25 DIAGNOSIS — K580 Irritable bowel syndrome with diarrhea: Secondary | ICD-10-CM | POA: Diagnosis not present

## 2016-12-25 DIAGNOSIS — I10 Essential (primary) hypertension: Secondary | ICD-10-CM

## 2016-12-25 DIAGNOSIS — E538 Deficiency of other specified B group vitamins: Secondary | ICD-10-CM | POA: Diagnosis not present

## 2016-12-25 LAB — BASIC METABOLIC PANEL
BUN: 19 mg/dL (ref 6–23)
CALCIUM: 10.1 mg/dL (ref 8.4–10.5)
CO2: 27 mEq/L (ref 19–32)
Chloride: 102 mEq/L (ref 96–112)
Creatinine, Ser: 1.01 mg/dL (ref 0.40–1.20)
GFR: 58.68 mL/min — ABNORMAL LOW (ref >60.00)
GLUCOSE: 101 mg/dL — AB (ref 70–99)
POTASSIUM: 5.1 meq/L (ref 3.5–5.1)
SODIUM: 137 meq/L (ref 135–145)

## 2016-12-25 LAB — VITAMIN B12: Vitamin B-12: 262 pg/mL (ref 211–911)

## 2016-12-25 NOTE — Progress Notes (Signed)
Pre visit review using our clinic review tool, if applicable. No additional management support is needed unless otherwise documented below in the visit note. 

## 2016-12-25 NOTE — Patient Instructions (Signed)
GO TO THE LAB : Get the blood work     GO TO THE FRONT DESK Schedule your next appointment for a  physical exam in 6 months  

## 2016-12-25 NOTE — Progress Notes (Signed)
Subjective:    Patient ID: [name] [name], female    DOB: 01-Sep-1950, 66 y.o.   MRN: [id number]  DOS:  12/25/2016 Type of visit - description : rov Interval history:  Since the last office visit she is doing well. Good compliance of medication except that she is not taking vitamin B-12 supplements. HTN: Well-controlled, ambulatory BPs within normal.  Review of Systems IBS: Continue with diarrhea on and off, wonders if she could take [place] again. Occasionally has palpitations, usually in the morning when she wakes up, last few minutes, no associated nausea, chest pain, diaphoresis.  Past Medical History:  Diagnosis Date  . Anxiety   . Bilateral lower extremity pain   . Cholelithiasis   . Colon polyps    adenomatous  . High cholesterol   . Hx of radiation therapy 10/07/11 to 11/20/11   right lung  . Hx of radiation therapy 01/07/2012- 01/21/12   cranial irradiation  . Hypertension   . Lung cancer ([place]) 09/19/11   Stage III currently in remission  . Osteoporosis 04/2014   Dexa scan by Dr. Corinna Capra, T-score -2.7, next in 2 years, with at least 13 % major fracture in 10 years  . Pitting edema    Bilateral lower extremities, duplex ultrasound 04/03/2015 normal, no DVT    Past Surgical History:  Procedure Laterality Date  . CHOLECYSTECTOMY  04/2011   biliary stent placement  . [place]  2015  . Josph Macho Touch     Vaginal procedure  . TUBAL LIGATION      Social History   Social History  . Marital status: Divorced    Spouse name: N/A  . Number of children: 2  . Years of education: N/A   Occupational History  . retired    Social History Main Topics  . Smoking status: Former Smoker    Types: Cigarettes    Quit date: 09/08/2011  . Smokeless tobacco: Never Used  . Alcohol use Yes     Comment: occasional  . Drug use: No  . Sexual activity: Not on file   Other Topics Concern  . Not on file   Social History Narrative   divorced, 2 kids, lives by  herself, has a boyfriend                    Allergies as of 12/25/2016      Reactions   Codeine Nausea And Vomiting   Penicillins Rash      Medication List       Accurate as of 12/25/16 11:59 PM. Always use your most recent med list.          ALPRAZolam 0.5 MG tablet Commonly known as:  XANAX Take 1-2 tablets (0.5-1 mg total) by mouth daily as needed for anxiety.   APPLE CIDER VINEGAR PO Take by mouth. 2 tbsp by mouth daily   aspirin 81 MG tablet Take 81 mg by mouth daily.   BIOTIN PO Take by mouth.   citalopram 40 MG tablet Commonly known as:  CELEXA Take 1 tablet (40 mg total) by mouth daily.   ezetimibe 10 MG tablet Commonly known as:  ZETIA Take 1 tablet (10 mg total) by mouth daily.   IMODIUM PO Take 3 mLs by mouth daily. Reported on 02/07/2016   lisinopril 20 MG tablet Commonly known as:  PRINIVIL,ZESTRIL Take 1 tablet (20 mg total) by mouth daily.   raloxifene 60 MG tablet Commonly known as:  EVISTA 20 mg  daily   rosuvastatin 40 MG tablet Commonly known as:  CRESTOR Take 1 tablet (40 mg total) by mouth at bedtime.   Vitamin D3 2000 units capsule Take 4,000 Units by mouth daily.          Objective:   Physical Exam BP 124/62 (BP Location: Left Arm, Patient Position: Sitting, Cuff Size: Small)   Pulse 78   Temp 98 F (36.7 C) (Oral)   Resp 14   Ht '5\' 3"'$  (1.6 m)   Wt 160 lb 8 oz (72.8 kg)   SpO2 98%   BMI 28.43 kg/m  General:   Well developed, well nourished . NAD.  HEENT:  Normocephalic . Face symmetric, atraumatic Lungs:  CTA B Normal respiratory effort, no intercostal retractions, no accessory muscle use. Heart: RRR,  no murmur.  No pretibial edema bilaterally  Skin: Not pale. Not jaundice Neurologic:  alert & oriented X3.  Speech normal, gait appropriate for age and unassisted Psych--  Cognition and judgment appear intact.  Cooperative with normal attention span and concentration.  Behavior appropriate. No anxious or  depressed appearing.      Assessment & Plan:   Assessment   HTN Hyperlipidemia -- cramps with Lipitor dc 2015 Depression . Anxiety -- on citalopram, rarely uses Xanax Osteoporosis-- per DEXA at gyn 05-10-2014 --->  was on boniva, now evista  B12 deficiency dx 01/2014 Vit D  def  COPD: Mild last PFTs  Lung cancer SCC DX 2013, XRT chest and brain Lower extremity edema [place] (-) for DVT 03-2015 Coronary calcifications per CT chest 6 2016 IBS, diarrhea predominant   on Imodium as needed; GI 2015-- rx questran, self d/c d/t constipation ASA intolerant (tinnitus)  PLAN: HTN: Continue lisinopril,  check a BMP, normal ambulatory BPs High cholesterol: Currently on Crestor- Zetia, numbers look much better. Depression anxiety: Well-controlled, very rarely takes Xanax B12 deficiency: Not on supplements, check labs. Consider either po or injection suplements. IBS: Diarrhea predominant, still sxs despite Imodium, in the past Questran cause constipation. We agree she will try half package of Questran and see how that works. Palpitations: As above, recommend observation. RTC 6 weeks      HTN: Check a BMP, BP elevated today upon arrival, recheck BP 130/80. Recommend to continue with lisinopril check at home. See instructions Hyperlipidemia: Based on last cholesterol panel, Crestor dose increase. Recheck labs. Improve diet. Depression anxiety: Currently under excellent control, refill Xanax.  IBS: Chronic diarrhea, well controlled as long as she use Imodium. She will consider very low-dose of Questran. B12 deficiency: Currently taking no supplements, recommend to do that. Coronary calcifications on CT scan: We have been unable to schedule a stress test so far. Primary care: Flu shot today RTC 10-2016

## 2016-12-26 NOTE — Assessment & Plan Note (Signed)
HTN: Continue lisinopril,  check a BMP, normal ambulatory BPs High cholesterol: Currently on Crestor- Zetia, numbers look much better. Depression anxiety: Well-controlled, very rarely takes Xanax B12 deficiency: Not on supplements, check labs. Consider either po or injection suplements. IBS: Diarrhea predominant, still sxs despite Imodium, in the past Questran cause constipation. We agree she will try half package of Questran and see how that works. Palpitations: As above, recommend observation. RTC 6 weeks

## 2017-01-14 ENCOUNTER — Telehealth: Payer: Self-pay | Admitting: Internal Medicine

## 2017-01-14 NOTE — Telephone Encounter (Signed)
Request call to get lab results from 12/25/2016

## 2017-01-14 NOTE — Telephone Encounter (Signed)
Notes recorded by Damita Dunnings, [place] on 12/29/2016 at 9:14 AM EDT Letter printed and mailed to Pt. ------  Notes recorded by Colon Branch, MD on 12/29/2016 at 9:05 AM EDT Send a letter Lannette Donath, your potassium and kidney function remain normal, your B12 levels are good. Continue the same medications

## 2017-01-21 ENCOUNTER — Other Ambulatory Visit: Payer: Self-pay | Admitting: Internal Medicine

## 2017-01-21 DIAGNOSIS — E785 Hyperlipidemia, unspecified: Secondary | ICD-10-CM

## 2017-01-30 ENCOUNTER — Ambulatory Visit (HOSPITAL_COMMUNITY)
Admission: RE | Admit: 2017-01-30 | Discharge: 2017-01-30 | Disposition: A | Discharge status: Home / Self Care | Payer: BLUE CROSS/BLUE SHIELD | Source: Ambulatory Visit | Attending: Internal Medicine | Admitting: Internal Medicine

## 2017-01-30 DIAGNOSIS — C3491 Malignant neoplasm of unspecified part of right bronchus or lung: Secondary | ICD-10-CM

## 2017-01-30 DIAGNOSIS — Z923 Personal history of irradiation: Secondary | ICD-10-CM | POA: Diagnosis not present

## 2017-01-30 MED ORDER — IOPAMIDOL (ISOVUE-300) INJECTION 61%
INTRAVENOUS | Status: AC
  Filled 2017-01-30: qty 75

## 2017-01-30 MED ORDER — IOPAMIDOL (ISOVUE-300) INJECTION 61%
75.0000 mL | Freq: Once | INTRAVENOUS | Status: AC | PRN
  Administered 2017-01-30: 75 mL via INTRAVENOUS

## 2017-02-05 ENCOUNTER — Other Ambulatory Visit (HOSPITAL_BASED_OUTPATIENT_CLINIC_OR_DEPARTMENT_OTHER): Payer: BLUE CROSS/BLUE SHIELD

## 2017-02-05 DIAGNOSIS — C3491 Malignant neoplasm of unspecified part of right bronchus or lung: Secondary | ICD-10-CM

## 2017-02-05 DIAGNOSIS — Z85118 Personal history of other malignant neoplasm of bronchus and lung: Secondary | ICD-10-CM | POA: Diagnosis not present

## 2017-02-05 LAB — CBC WITH DIFFERENTIAL/PLATELET
BASO%: 0.5 % (ref 0.0–2.0)
BASOS ABS: 0 10*3/uL (ref 0.0–0.1)
EOS%: 3.8 % (ref 0.0–7.0)
Eosinophils Absolute: 0.2 10*3/uL (ref 0.0–0.5)
HCT: 38.8 % (ref 34.8–46.6)
HEMOGLOBIN: 12.7 g/dL (ref 11.6–15.9)
LYMPH#: 0.6 10*3/uL — AB (ref 0.9–3.3)
LYMPH%: 15 % (ref 14.0–49.7)
MCH: 31 pg (ref 25.1–34.0)
MCHC: 32.7 g/dL (ref 31.5–36.0)
MCV: 94.6 fL (ref 79.5–101.0)
MONO#: 0.5 10*3/uL (ref 0.1–0.9)
MONO%: 11.5 % (ref 0.0–14.0)
NEUT#: 2.9 10*3/uL (ref 1.5–6.5)
NEUT%: 69.2 % (ref 38.4–76.8)
Platelets: 206 10*3/uL (ref 145–400)
RBC: 4.1 10*6/uL (ref 3.70–5.45)
RDW: 12.5 % (ref 11.2–14.5)
WBC: 4.2 10*3/uL (ref 3.9–10.3)

## 2017-02-05 LAB — COMPREHENSIVE METABOLIC PANEL
ALT: 12 U/L (ref 0–55)
AST: 21 U/L (ref 5–34)
Albumin: 3.8 g/dL (ref 3.5–5.0)
Alkaline Phosphatase: 48 U/L (ref 40–150)
Anion Gap: 11 mEq/L (ref 3–11)
BUN: 16 mg/dL (ref 7.0–26.0)
CHLORIDE: 107 meq/L (ref 98–109)
CO2: 25 mEq/L (ref 22–29)
CREATININE: 1 mg/dL (ref 0.6–1.1)
Calcium: 9.5 mg/dL (ref 8.4–10.4)
EGFR: 60 mL/min/{1.73_m2} — ABNORMAL LOW (ref >90)
GLUCOSE: 96 mg/dL (ref 70–140)
Potassium: 4.3 mEq/L (ref 3.5–5.1)
SODIUM: 143 meq/L (ref 136–145)
Total Bilirubin: 0.31 mg/dL (ref 0.20–1.20)
Total Protein: 6.9 g/dL (ref 6.4–8.3)

## 2017-02-12 ENCOUNTER — Encounter: Payer: Self-pay | Admitting: Internal Medicine

## 2017-02-12 ENCOUNTER — Ambulatory Visit (HOSPITAL_BASED_OUTPATIENT_CLINIC_OR_DEPARTMENT_OTHER): Payer: BLUE CROSS/BLUE SHIELD | Admitting: Internal Medicine

## 2017-02-12 VITALS — BP 131/88 | HR 90 | Temp 98.7°F | Resp 18 | Ht 63.0 in | Wt 164.6 lb

## 2017-02-12 DIAGNOSIS — Z85118 Personal history of other malignant neoplasm of bronchus and lung: Secondary | ICD-10-CM

## 2017-02-12 DIAGNOSIS — C3490 Malignant neoplasm of unspecified part of unspecified bronchus or lung: Secondary | ICD-10-CM

## 2017-02-12 NOTE — Progress Notes (Signed)
[facility] [phone]   Fax:([phone]) [place], MD [address] [place] [address]  DIAGNOSIS: Limited stage small cell lung cancer diagnosed in January of 2013   PRIOR THERAPY:  1) Systemic chemotherapy with cisplatin at 60 mg per meter square given on day 1 and etoposide at 120 mg per meter square given on days one 2 and 3 with Neulasta support given on day 4 status post 4 cycles, last cycle was given on 12/02/2011 . This with concurrent with radiotherapy under the care of Dr. Lisbeth Renshaw.  2) prophylactic cranial irradiation under the care of Dr. Lisbeth Renshaw completed on 01/21/2012   CURRENT THERAPY: Observation.  INTERVAL HISTORY: [name] [name] 66 y.o. female returns to the clinic today for follow-up visit accompanied by her sister. The patient is feeling fine with no specific complaints. She has been observation for the last few years with no concerning complaints. She denied having any chest pain, shortness of breath, cough or hemoptysis. She denied having any nausea, vomiting, diarrhea or constipation. She has no fever or chills. The patient had repeat CT scan of the chest performed recently and she is here for evaluation and discussion of her scan results.  MEDICAL HISTORY: Past Medical History:  Diagnosis Date  . Anxiety   . Bilateral lower extremity pain   . Cholelithiasis   . Colon polyps    adenomatous  . High cholesterol   . Hx of radiation therapy 10/07/11 to 11/20/11   right lung  . Hx of radiation therapy 01/07/2012- 01/21/12   cranial irradiation  . Hypertension   . Lung cancer ([place]) 09/19/11   Stage III currently in remission  . Osteoporosis 04/2014   Dexa scan by Dr. Corinna Capra, T-score -2.7, next in 2 years, with at least 13 % major fracture in 10 years  . Pitting edema    Bilateral lower extremities, duplex ultrasound 04/03/2015 normal, no DVT    ALLERGIES:  is allergic to codeine and  penicillins.  MEDICATIONS:  Current Outpatient Prescriptions  Medication Sig Dispense Refill  . ALPRAZolam (XANAX) 0.5 MG tablet Take 1-2 tablets (0.5-1 mg total) by mouth daily as needed for anxiety. 40 tablet 2  . APPLE CIDER VINEGAR PO Take by mouth. 2 tbsp by mouth daily    . aspirin 81 MG tablet Take 81 mg by mouth daily.    Marland Kitchen BIOTIN PO Take by mouth.    . Cholecalciferol (VITAMIN D3) 2000 UNITS capsule Take 4,000 Units by mouth daily.     . citalopram (CELEXA) 40 MG tablet Take 1 tablet (40 mg total) by mouth daily. 30 tablet 1  . ezetimibe (ZETIA) 10 MG tablet Take 1 tablet (10 mg total) by mouth daily. 30 tablet 5  . lisinopril (PRINIVIL,ZESTRIL) 20 MG tablet Take 1 tablet (20 mg total) by mouth daily. 30 tablet 5  . Loperamide HCl (IMODIUM PO) Take 3 mLs by mouth daily. Reported on 02/07/2016    . raloxifene (EVISTA) 60 MG tablet 20 mg daily  12  . rosuvastatin (CRESTOR) 40 MG tablet Take 1 tablet (40 mg total) by mouth at bedtime. 30 tablet 5   No current facility-administered medications for this visit.     SURGICAL HISTORY:  Past Surgical History:  Procedure Laterality Date  . CHOLECYSTECTOMY  04/2011   biliary stent placement  . [place]  2015  . Josph Macho Touch  Vaginal procedure  . TUBAL LIGATION      REVIEW OF SYSTEMS:  A comprehensive review of systems was negative.   PHYSICAL EXAMINATION: General appearance: alert, cooperative and no distress Head: Normocephalic, without obvious abnormality, atraumatic Neck: no adenopathy Lymph nodes: Cervical, supraclavicular, and axillary nodes normal. Resp: clear to auscultation bilaterally Back: symmetric, no curvature. ROM normal. No CVA tenderness. Cardio: regular rate and rhythm, S1, S2 normal, no murmur, click, rub or gallop GI: soft, non-tender; bowel sounds normal; no masses,  no organomegaly Extremities: extremities normal, atraumatic, no cyanosis or edema  ECOG PERFORMANCE STATUS: 0 -  Asymptomatic  Blood pressure 131/88, pulse 90, temperature 98.7 F (37.1 C), temperature source Oral, resp. rate 18, height 5\' 3"  (1.6 m), weight 164 lb 9.6 oz (74.7 kg), SpO2 100 %.  LABORATORY DATA: Lab Results  Component Value Date   WBC 4.2 02/05/2017   HGB 12.7 02/05/2017   HCT 38.8 02/05/2017   MCV 94.6 02/05/2017   PLT 206 02/05/2017      Chemistry      Component Value Date/Time   NA 143 02/05/2017 0817   K 4.3 02/05/2017 0817   CL 102 12/25/2016 1215   CL 104 12/23/2012 1338   CO2 25 02/05/2017 0817   BUN 16.0 02/05/2017 0817   CREATININE 1.0 02/05/2017 0817      Component Value Date/Time   CALCIUM 9.5 02/05/2017 0817   ALKPHOS 48 02/05/2017 0817   AST 21 02/05/2017 0817   ALT 12 02/05/2017 0817   BILITOT 0.31 02/05/2017 0817       RADIOGRAPHIC STUDIES: Ct Chest W Contrast  Result Date: 01/30/2017 CLINICAL DATA:  Patient with history of lung cancer, restaging evaluation. Patient status post chemotherapy and radiation. EXAM: CT CHEST WITH CONTRAST TECHNIQUE: Multidetector CT imaging of the chest was performed during intravenous contrast administration. CONTRAST:  47mL ISOVUE-300 IOPAMIDOL (ISOVUE-300) INJECTION 61% COMPARISON:  Chest CT 02/05/2016. FINDINGS: Cardiovascular: Normal heart size. No pericardial effusion. Dense coronary arterial vascular calcifications. Mediastinum/Nodes: No enlarged axillary, mediastinal or hilar lymphadenopathy. Esophagus is normal in appearance. Lungs/Pleura: Central airways are patent. Stable right paramediastinal radiation change. No evidence for residual or recurrent tumor. No pleural effusion or pneumothorax. Upper Abdomen: No acute process. Musculoskeletal: Thoracic spine degenerative changes. No aggressive or acute appearing osseous lesions. IMPRESSION: Stable CT of the chest. No evidence for locally recurrent or metastatic disease. Stable post radiation changes right mid lung. Electronically Signed   By: Lovey Newcomer M.D.   On:  01/30/2017 11:36   ASSESSMENT AND PLAN:  This is a very pleasant 66 years old white female with limited stage small cell lung cancer status post systemic chemotherapy with cisplatin and etoposide concurrent with radiation and followed by prophylactic cranial irradiation completed in May 2013. She has been observation since that time and her recent CT scan of the chest showed no evidence for disease recurrence or metastatic disease. I discussed the scan results with the patient and her sister. I recommended for her to continue in observation with repeat CT scan of the chest in one year. She was advised to call immediately if she has any concerning symptoms in the interval. The patient voices understanding of current disease status and treatment options and is in agreement with the current care plan. All questions were answered. The patient knows to call the clinic with any problems, questions or concerns. We can certainly see the patient much sooner if necessary.  Disclaimer: This note was dictated with voice recognition software. Similar  sounding words can inadvertently be transcribed and may not be corrected upon review.

## 2017-02-13 ENCOUNTER — Telehealth: Payer: Self-pay | Admitting: Internal Medicine

## 2017-02-13 NOTE — Telephone Encounter (Signed)
Scheduled appt per 6/21 - sent reminder letter in the mail - lab/ct/MM in one year.

## 2017-02-22 DIAGNOSIS — C4310 Malignant melanoma of unspecified eyelid, including canthus: Secondary | ICD-10-CM

## 2017-02-22 HISTORY — DX: Malignant melanoma of unspecified eyelid, including canthus: C43.10

## 2017-02-22 HISTORY — PX: MOHS SURGERY: SUR867

## 2017-03-08 ENCOUNTER — Other Ambulatory Visit: Payer: Self-pay | Admitting: Internal Medicine

## 2017-03-16 DIAGNOSIS — C4310 Malignant melanoma of unspecified eyelid, including canthus: Secondary | ICD-10-CM | POA: Insufficient documentation

## 2017-04-29 ENCOUNTER — Encounter: Payer: Self-pay | Admitting: Internal Medicine

## 2017-04-29 ENCOUNTER — Ambulatory Visit (INDEPENDENT_AMBULATORY_CARE_PROVIDER_SITE_OTHER): Payer: BLUE CROSS/BLUE SHIELD | Admitting: Family Medicine

## 2017-04-29 ENCOUNTER — Encounter: Payer: Self-pay | Admitting: Family Medicine

## 2017-04-29 VITALS — BP 120/72 | HR 94 | Temp 98.7°F | Ht 63.0 in | Wt 162.1 lb

## 2017-04-29 DIAGNOSIS — H8111 Benign paroxysmal vertigo, right ear: Secondary | ICD-10-CM | POA: Diagnosis not present

## 2017-04-29 DIAGNOSIS — R0781 Pleurodynia: Secondary | ICD-10-CM

## 2017-04-29 MED ORDER — MECLIZINE HCL 25 MG PO TABS: 25.0000 mg | ORAL_TABLET | Freq: Three times a day (TID) | ORAL | 0 refills | Status: DC | PRN

## 2017-04-29 NOTE — Progress Notes (Signed)
Pre visit review using our clinic review tool, if applicable. No additional management support is needed unless otherwise documented below in the visit note. 

## 2017-04-29 NOTE — Telephone Encounter (Signed)
error:[id number] ° °

## 2017-04-29 NOTE — Patient Instructions (Addendum)
How to Perform the Epley Maneuver The Epley maneuver is an exercise that relieves symptoms of vertigo. Vertigo is the feeling that you or your surroundings are moving when they are not. When you feel vertigo, you may feel like the room is spinning and have trouble walking. Dizziness is a little different than vertigo. When you are dizzy, you may feel unsteady or light-headed. You can do this maneuver at home whenever you have symptoms of vertigo. You can do it up to 3 times a day until your symptoms go away. Even though the Epley maneuver may relieve your vertigo for a few weeks, it is possible that your symptoms will return. This maneuver relieves vertigo, but it does not relieve dizziness. What are the risks? If it is done correctly, the Epley maneuver is considered safe. Sometimes it can lead to dizziness or nausea that goes away after a short time. If you develop other symptoms, such as changes in vision, weakness, or numbness, stop doing the maneuver and call your health care provider. How to perform the Epley maneuver 1. Sit on the edge of a bed or table with your back straight and your legs extended or hanging over the edge of the bed or table. 2. Turn your head halfway toward the affected ear or side. 3. Lie backward quickly with your head turned until you are lying flat on your back. You may want to position a pillow under your shoulders. 4. Hold this position for 30 seconds. You may experience an attack of vertigo. This is normal. 5. Turn your head to the opposite direction until your unaffected ear is facing the floor. 6. Hold this position for 30 seconds. You may experience an attack of vertigo. This is normal. Hold this position until the vertigo stops. 7. Turn your whole body to the same side as your head. Hold for another 30 seconds. 8. Sit back up. You can repeat this exercise up to 3 times a day. Follow these instructions at home:  After doing the Epley maneuver, you can return to  your normal activities.  Ask your health care provider if there is anything you should do at home to prevent vertigo. He or she may recommend that you: ? Keep your head raised (elevated) with two or more pillows while you sleep. ? Do not sleep on the side of your affected ear. ? Get up slowly from bed. ? Avoid sudden movements during the day. ? Avoid extreme head movement, like looking up or bending over. Contact a health care provider if:  Your vertigo gets worse.  You have other symptoms, including: ? Nausea. ? Vomiting. ? Headache. Get help right away if:  You have vision changes.  You have a severe or worsening headache or neck pain.  You cannot stop vomiting.  You have new numbness or weakness in any part of your body. Summary  Vertigo is the feeling that you or your surroundings are moving when they are not.  The Epley maneuver is an exercise that relieves symptoms of vertigo.  If the Epley maneuver is done correctly, it is considered safe. You can do it up to 3 times a day. This information is not intended to replace advice given to you by your health care provider. Make sure you discuss any questions you have with your health care provider. Document Released: 08/16/2013 Document Revised: 07/01/2016 Document Reviewed: 07/01/2016 Elsevier Interactive Patient Education  2017 [place] out Youtube if you have difficulty understanding the above.  Keep taking big breaths.  Let us know right away if you start having respiratory complaints.

## 2017-04-29 NOTE — Progress Notes (Signed)
Chief Complaint  Patient presents with  . Dizziness    [name] [name] is 66 y.o. pt here for dizziness.  Duration: 4 days  Lasts for a few seconds. Happens when she turns over or looks up. Pass out? No Spinning? Yes Recent illness/fever? No Headache? No Neurologic signs? No Change in PO intake? No  Fell into nightstand this AM and hurt R back. Hurts to take big breath, no bruising or swelling.   ROS:  Neuro: As noted in HPI Eyes: No vision changes  Past Medical History:  Diagnosis Date  . Anxiety   . Bilateral lower extremity pain   . Cholelithiasis   . Colon polyps    adenomatous  . High cholesterol   . Hx of radiation therapy 10/07/11 to 11/20/11   right lung  . Hx of radiation therapy 01/07/2012- 01/21/12   cranial irradiation  . Hypertension   . Lung cancer ([place]) 09/19/11   Stage III currently in remission  . Malignant melanoma of skin of canthus of right eye ([place]) 02/2017  . Osteoporosis 04/2014   Dexa scan by Dr. Corinna Capra, T-score -2.7, next in 2 years, with at least 13 % major fracture in 10 years  . Pitting edema    Bilateral lower extremities, duplex ultrasound 04/03/2015 normal, no DVT    Family History  Problem Relation Age of Onset  . Bladder Cancer Father   . Stroke Father   . Colon polyps Father   . Atrial fibrillation Mother   . Heart disease Mother        CHF  . Clotting disorder Mother        warfarin  . Colon polyps Mother   . Colon cancer Maternal Aunt 46  . Colon polyps Sister   . Breast cancer Neg Hx     Allergies as of 04/29/2017      Reactions   Codeine Nausea And Vomiting   Penicillins Rash      Medication List       Accurate as of 04/29/17  5:17 PM. Always use your most recent med list.          ALPRAZolam 0.5 MG tablet Commonly known as:  XANAX Take 1-2 tablets (0.5-1 mg total) by mouth daily as needed for anxiety.   APPLE CIDER VINEGAR PO Take by mouth. 2 tbsp by mouth daily   aspirin 81 MG tablet Take 81 mg by  mouth daily.   BIOTIN PO Take by mouth.   citalopram 40 MG tablet Commonly known as:  CELEXA Take 1 tablet (40 mg total) by mouth daily.   ezetimibe 10 MG tablet Commonly known as:  ZETIA Take 1 tablet (10 mg total) by mouth daily.   IMODIUM PO Take 3 mLs by mouth daily. Reported on 02/07/2016   lisinopril 20 MG tablet Commonly known as:  PRINIVIL,ZESTRIL Take 1 tablet (20 mg total) by mouth daily.   meclizine 25 MG tablet Commonly known as:  ANTIVERT Take 1 tablet (25 mg total) by mouth 3 (three) times daily as needed for dizziness.   raloxifene 60 MG tablet Commonly known as:  EVISTA 20 mg daily   rosuvastatin 40 MG tablet Commonly known as:  CRESTOR Take 1 tablet (40 mg total) by mouth at bedtime.   Vitamin D3 2000 units capsule Take 4,000 Units by mouth daily.            Discharge Care Instructions        Start     Ordered  04/29/17 0000  meclizine (ANTIVERT) 25 MG tablet  3 times daily PRN     04/29/17 1555      BP 120/72 (BP Location: Left Arm, Patient Position: Sitting, Cuff Size: Normal)   Pulse 94   Temp 98.7 F (37.1 C) (Oral)   Ht 5\' 3"  (1.6 m)   Wt 162 lb 1 oz (73.5 kg)   SpO2 97%   BMI 28.71 kg/m  General: Awake, alert, appears stated age Eyes: PERRLA, EOMi Ears: Patent, TM's neg b/l Heart: RRR, no murmurs, no carotid bruits Lungs: CTAB, no accessory muscle use MSK: 5/5 strength throughout, gait normal, +TTP over R CVA at approx R10-11, no crepitus or deformity, no ecchymosis or edema Neuro: No cerebellar signs, patellar reflex 2/4 b/l wo clonus, calcaneal reflex 1/4 b/l wo clonus, biceps reflex 2/4 b/l wo clonus; Dix-Hall-Pike pos on R. Psych: Age appropriate judgment and insight, normal mood and affect  Benign paroxysmal positional vertigo of right ear - Plan: meclizine (ANTIVERT) 25 MG tablet  Rib pain on right side  Orders as above. Epley Maneuver for home. Youtube for further instruction. Cont Aleve. Ice. Take deep breaths.  Let us know if resp issues arise. F/u prn. Pt voiced understanding and agreement to the plan.  [place], DO 04/29/17 5:17 PM

## 2017-05-15 ENCOUNTER — Other Ambulatory Visit: Payer: Self-pay | Admitting: Obstetrics and Gynecology

## 2017-05-15 DIAGNOSIS — N63 Unspecified lump in unspecified breast: Secondary | ICD-10-CM

## 2017-05-25 ENCOUNTER — Ambulatory Visit
Admission: RE | Admit: 2017-05-25 | Discharge: 2017-05-25 | Disposition: A | Discharge status: Home / Self Care | Payer: BLUE CROSS/BLUE SHIELD | Source: Ambulatory Visit | Attending: Obstetrics and Gynecology | Admitting: Obstetrics and Gynecology

## 2017-05-25 DIAGNOSIS — N63 Unspecified lump in unspecified breast: Secondary | ICD-10-CM

## 2017-05-25 LAB — HM MAMMOGRAPHY

## 2017-06-16 ENCOUNTER — Telehealth: Payer: Self-pay

## 2017-06-16 NOTE — Telephone Encounter (Signed)
Patient made phone call to [facility] stating she needed medication refills. Follow up call made to patient. States she needs all her medications refill according to her 2 pharmacies. States she is out of town. Advised patient she would need to let us know exactly which medications she needs because she has some that are PRN. Patient states she will be back tomorrow and will call in ant let us know.

## 2017-06-18 ENCOUNTER — Telehealth: Payer: Self-pay | Admitting: *Deleted

## 2017-06-18 ENCOUNTER — Other Ambulatory Visit: Payer: Self-pay

## 2017-06-18 DIAGNOSIS — E785 Hyperlipidemia, unspecified: Secondary | ICD-10-CM

## 2017-06-18 MED ORDER — LISINOPRIL 20 MG PO TABS: 20.0000 mg | ORAL_TABLET | Freq: Every day | ORAL | 5 refills | Status: DC

## 2017-06-18 MED ORDER — ROSUVASTATIN CALCIUM 40 MG PO TABS: 40.0000 mg | ORAL_TABLET | Freq: Every day | ORAL | 5 refills | Status: DC

## 2017-06-18 MED ORDER — EZETIMIBE 10 MG PO TABS: 10.0000 mg | ORAL_TABLET | Freq: Every day | ORAL | 5 refills | Status: DC

## 2017-06-18 MED ORDER — CITALOPRAM HYDROBROMIDE 40 MG PO TABS: 40.0000 mg | ORAL_TABLET | Freq: Every day | ORAL | 5 refills | Status: DC

## 2017-06-18 NOTE — Telephone Encounter (Signed)
Copied from [facility]. Topic: Quick Communication - See Telephone Encounter >> Jun 18, 2017 10:00 AM Eli Phillips, NT wrote: CRM for notification. See Telephone encounter for:  06/18/17.  Patient needs a refill of her Crestor, Zetia, Lisinopril, Celexa. Her pharmacy is [facility] on [address]/ [address]. Please advise. Thank you

## 2017-06-22 MED ORDER — CITALOPRAM HYDROBROMIDE 40 MG PO TABS: 40.0000 mg | ORAL_TABLET | Freq: Every day | ORAL | 1 refills | Status: DC

## 2017-06-22 MED ORDER — EZETIMIBE 10 MG PO TABS: 10.0000 mg | ORAL_TABLET | Freq: Every day | ORAL | 1 refills | Status: DC

## 2017-06-22 MED ORDER — ROSUVASTATIN CALCIUM 40 MG PO TABS: 40.0000 mg | ORAL_TABLET | Freq: Every day | ORAL | 1 refills | Status: DC

## 2017-06-22 MED ORDER — LISINOPRIL 20 MG PO TABS: 20.0000 mg | ORAL_TABLET | Freq: Every day | ORAL | 1 refills | Status: DC

## 2017-06-22 NOTE — Telephone Encounter (Signed)
Requested Rx's sent.

## 2017-06-22 NOTE — Addendum Note (Signed)
Addended byDamita Dunnings D on: 06/22/2017 04:22 PM   Modules accepted: Orders

## 2017-06-22 NOTE — Telephone Encounter (Signed)
Patient checking on the status of medication below, patient requesting 30 day supply, please advise    [place], [place], [place] [address]

## 2017-07-09 ENCOUNTER — Encounter: Payer: Self-pay | Admitting: Family Medicine

## 2017-07-09 ENCOUNTER — Encounter: Payer: Self-pay | Admitting: Internal Medicine

## 2017-07-09 ENCOUNTER — Ambulatory Visit: Payer: BLUE CROSS/BLUE SHIELD | Admitting: Family Medicine

## 2017-07-09 ENCOUNTER — Ambulatory Visit (INDEPENDENT_AMBULATORY_CARE_PROVIDER_SITE_OTHER): Payer: BLUE CROSS/BLUE SHIELD | Admitting: Internal Medicine

## 2017-07-09 VITALS — BP 122/78 | HR 96 | Temp 98.3°F | Resp 14 | Ht 63.0 in | Wt 163.1 lb

## 2017-07-09 DIAGNOSIS — M25511 Pain in right shoulder: Secondary | ICD-10-CM

## 2017-07-09 DIAGNOSIS — Z0000 Encounter for general adult medical examination without abnormal findings: Secondary | ICD-10-CM

## 2017-07-09 DIAGNOSIS — E785 Hyperlipidemia, unspecified: Secondary | ICD-10-CM | POA: Diagnosis not present

## 2017-07-09 DIAGNOSIS — Z23 Encounter for immunization: Secondary | ICD-10-CM | POA: Diagnosis not present

## 2017-07-09 LAB — BASIC METABOLIC PANEL
BUN: 20 mg/dL (ref 6–23)
CO2: 25 mEq/L (ref 19–32)
Calcium: 10.1 mg/dL (ref 8.4–10.5)
Chloride: 102 mEq/L (ref 96–112)
Creatinine, Ser: 1 mg/dL (ref 0.40–1.20)
GFR: 59.25 mL/min — ABNORMAL LOW (ref >60.00)
Glucose, Bld: 99 mg/dL (ref 70–99)
Potassium: 5.1 mEq/L (ref 3.5–5.1)
Sodium: 138 mEq/L (ref 135–145)

## 2017-07-09 LAB — LIPID PANEL
CHOLESTEROL: 303 mg/dL — AB (ref 0–200)
HDL: 86 mg/dL (ref >39.00)
LDL Cholesterol: 189 mg/dL — ABNORMAL HIGH (ref 0–99)
NonHDL: 216.96
Total CHOL/HDL Ratio: 4
Triglycerides: 139 mg/dL (ref 0.0–149.0)
VLDL: 27.8 mg/dL (ref 0.0–40.0)

## 2017-07-09 LAB — TSH: TSH: 2.01 u[IU]/mL (ref 0.35–4.50)

## 2017-07-09 NOTE — Assessment & Plan Note (Signed)
HTN, hyperlipidemia, anxiety, depression: All seemed well controlled on citalopram, Xanax, Zetia, Crestor and lisinopril. B12 deficiency: Encourage supplements daily, last level 12/25/2016, in the lower side of normal. Vitamin D deficiency: On supplements Lung cancer, melanoma: Managed elsewhere. Shoulder pain: Refer to sports medicine RTC 6 months

## 2017-07-09 NOTE — Patient Instructions (Signed)
GO TO THE LAB : Get the blood work     GO TO THE FRONT DESK Schedule your next appointment for a   routine checkup in 6 months  Go back on a baby aspirin, 81 mg daily

## 2017-07-09 NOTE — Progress Notes (Signed)
Pre visit review using our clinic review tool, if applicable. No additional management support is needed unless otherwise documented below in the visit note. 

## 2017-07-09 NOTE — Progress Notes (Signed)
Subjective:    Patient ID: [name] [name], female    DOB: [birth year], 66 y.o.   MRN: [id number]  DOS:  07/09/2017 Type of visit - description : cpx Interval history: Since the last time I saw her, she had episodes of vertigo, no further problems, feels well.   Review of Systems 2 months history of right shoulder pain, points to the deltoid area, pain is worse when she reaches across. Denies upper extremity paresthesias but admits to some neck pain, chronic on and off, not far from baseline. Also reports easy bruising but denies deep hematomas, no blood in the urine/stools, no excessive gum bleeding.  Other than above, a 14 point review of systems is negative     Past Medical History:  Diagnosis Date  . Anxiety   . Bilateral lower extremity pain   . Cholelithiasis   . Colon polyps    adenomatous  . High cholesterol   . Hx of radiation therapy 10/07/11 to 11/20/11   right lung  . Hx of radiation therapy 01/07/2012- 01/21/12   cranial irradiation  . Hypertension   . Lung cancer ([place]) 09/19/11   Stage III currently in remission  . Malignant melanoma of skin of canthus of right eye ([place]) 02/2017  . Osteoporosis 04/2014   Dexa scan by Dr. Corinna Capra, T-score -2.7, next in 2 years, with at least 13 % major fracture in 10 years  . Pitting edema    Bilateral lower extremities, duplex ultrasound 04/03/2015 normal, no DVT    Past Surgical History:  Procedure Laterality Date  . CHOLECYSTECTOMY  04/2011   biliary stent placement  . [place]  2015  . MOHS SURGERY Right 02/2017   cheek and eyelid  . Josph Macho Touch     Vaginal procedure  . TUBAL LIGATION      Social History   Socioeconomic History  . Marital status: Divorced    Spouse name: Not on file  . Number of children: 2  . Years of education: Not on file  . Highest education level: Not on file  Social Needs  . Financial resource strain: Not on file  . Food insecurity - worry: Not on file  . Food  insecurity - inability: Not on file  . Transportation needs - medical: Not on file  . Transportation needs - non-medical: Not on file  Occupational History  . Occupation: retired, Recruitment consultant  . Smoking status: Former Smoker    Types: Cigarettes    Last attempt to quit: 09/08/2011    Years since quitting: 5.8  . Smokeless tobacco: Never Used  Substance and Sexual Activity  . Alcohol use: Yes    Comment: occasional  . Drug use: No  . Sexual activity: Not on file  Other Topics Concern  . Not on file  Social History Narrative   divorced, 2 kids, lives by herself, has a boyfriend                Family History  Problem Relation Age of Onset  . Bladder Cancer Father   . Stroke Father   . Colon polyps Father   . Atrial fibrillation Mother   . Heart disease Mother        CHF  . Clotting disorder Mother        warfarin  . Colon polyps Mother   . Colon cancer Maternal Aunt 101  . Colon polyps Sister   . Breast cancer Neg Hx  Allergies as of 07/09/2017      Reactions   Codeine Nausea And Vomiting   Penicillins Rash      Medication List        Accurate as of 07/09/17  6:41 PM. Always use your most recent med list.          ALPRAZolam 0.5 MG tablet Commonly known as:  XANAX Take 1-2 tablets (0.5-1 mg total) by mouth daily as needed for anxiety.   aspirin EC 81 MG tablet Take 81 mg daily by mouth.   citalopram 40 MG tablet Commonly known as:  CELEXA Take 1 tablet (40 mg total) by mouth daily.   ezetimibe 10 MG tablet Commonly known as:  ZETIA Take 1 tablet (10 mg total) by mouth daily.   IMODIUM PO Take 3 mLs by mouth daily. Reported on 02/07/2016   lisinopril 20 MG tablet Commonly known as:  PRINIVIL,ZESTRIL Take 1 tablet (20 mg total) by mouth daily.   meclizine 25 MG tablet Commonly known as:  ANTIVERT Take 1 tablet (25 mg total) by mouth 3 (three) times daily as needed for dizziness.   raloxifene 60 MG tablet Commonly known as:   EVISTA 20 mg daily   rosuvastatin 40 MG tablet Commonly known as:  CRESTOR Take 1 tablet (40 mg total) by mouth at bedtime.   Vitamin D3 2000 units capsule Take 4,000 Units by mouth daily.          Objective:   Physical Exam BP 122/78 (BP Location: Left Arm, Patient Position: Sitting, Cuff Size: Small)   Pulse 96   Temp 98.3 F (36.8 C) (Oral)   Resp 14   Ht 5\' 3"  (1.6 m)   Wt 163 lb 2 oz (74 kg)   SpO2 96%   BMI 28.90 kg/m  General:   Well developed, well nourished . NAD.  Neck: No  thyromegaly  HEENT:  Normocephalic . Face symmetric, atraumatic Lungs:  CTA B Normal respiratory effort, no intercostal retractions, no accessory muscle use. Heart: RRR,  no murmur.  No pretibial edema bilaterally  Abdomen:  Not distended, soft, non-tender. No rebound or rigidity.   Skin: Exposed areas without rash. Not pale. Not jaundice MSK:  Cervical spine no TTP. Shoulder range of motion normal bilaterally, when she reaches across reports pain at the deltoid area. Neurologic:  alert & oriented X3.  Speech normal, gait appropriate for age and unassisted Strength symmetric and appropriate for age.  DTRs symmetric  psych: Cognition and judgment appear intact.  Cooperative with normal attention span and concentration.  Behavior appropriate. No anxious or depressed appearing.     Assessment & Plan:   Assessment   HTN Hyperlipidemia -- cramps with Lipitor dc 2015 Depression . Anxiety -- on citalopram, rarely uses Xanax Osteoporosis-- per DEXA at gyn 05-10-2014 --->  was on boniva, now evista  B12 deficiency dx 01/2014 Vit D  def  COPD: Mild last PFTs  Oncology: -Lung cancer SCC DX 2013, XRT chest and brain -melanoma 2018, R face, s/p Mohs Lower extremity edema [place] (-) for DVT 03-2015 Coronary calcifications per CT chest 6 2016 IBS, diarrhea predominant   on Imodium as needed; GI 2015-- rx questran, self d/c d/t constipation ASA intolerant (tinnitus) but ok  taking  low-dose  PLAN: HTN, hyperlipidemia, anxiety, depression: All seemed well controlled on citalopram, Xanax, Zetia, Crestor and lisinopril. B12 deficiency: Encourage supplements daily, last level 12/25/2016, in the lower side of normal. Vitamin D deficiency: On supplements Lung cancer, melanoma: Managed  elsewhere. Shoulder pain: Refer to sports medicine RTC 6 months

## 2017-07-09 NOTE — Assessment & Plan Note (Addendum)
-  Td shot 2008 booster at the next opportunity, s/p zostavax ; pnm 23 2003 and 06-2017; prevnar 2016; flu shot today -Sees gyn: they take care of osteoporosis-[facility]-PAP  -CCS: colonoscopy in 2007 @ [facility], 2 polyps Colonoscopy 05-2013 @ [facility], + polyps, next 5 years -labs: BMP, FLP, TSH Diet and exercise discussed

## 2017-07-09 NOTE — Patient Instructions (Signed)
You have rotator cuff impingement Avoid painful activities (overhead activities, lifting with extended arm) as much as possible at home and at the gym. Aleve 2 tabs twice a day with food OR ibuprofen 3 tabs three times a day with food for pain and inflammation - take for 7-10 days then as needed. Can take tylenol in addition to this. Subacromial injection may be beneficial to help with pain and to decrease inflammation. Consider physical therapy with transition to home exercise program. Do home exercise program with theraband and scapular stabilization exercises daily 3 sets of 10 once a day. If not improving at follow-up we will consider imaging, injection, physical therapy, and/or nitro patches. Follow up with me in 6 weeks.

## 2017-07-13 ENCOUNTER — Encounter: Payer: Self-pay | Admitting: Family Medicine

## 2017-07-13 NOTE — Addendum Note (Signed)
Addended byDamita Dunnings D on: 07/13/2017 08:51 AM   Modules accepted: Orders

## 2017-07-14 DIAGNOSIS — M25511 Pain in right shoulder: Secondary | ICD-10-CM | POA: Insufficient documentation

## 2017-07-14 NOTE — Assessment & Plan Note (Signed)
2/2 rotator cuff impingement.  Shown home exercise program to do daily.  Aleve or ibuprofen for 7-10 days then as needed.  Consider injection, physical therapy, imaging, nitro patches if not improving.  F/u in 6 weeks.

## 2017-07-14 NOTE — Progress Notes (Signed)
PCP and consultation requested by: Colon Branch, MD  Subjective:   HPI: Patient is a 66 y.o. female here for right shoulder pain.  Patient denies known injury or trauma. She reports having about 2 months of lateral right shoulder pain radiating down to fingers at time. Difficulty holding door due to pain. Tried aleve only a couple times with mild benefit. Pain level 5/10 and sharp. Worse lying on right side. Tried massage without benefit. No skin changes, numbness. Right handed.  Past Medical History:  Diagnosis Date  . Anxiety   . Bilateral lower extremity pain   . Cholelithiasis   . Colon polyps    adenomatous  . High cholesterol   . Hx of radiation therapy 10/07/11 to 11/20/11   right lung  . Hx of radiation therapy 01/07/2012- 01/21/12   cranial irradiation  . Hypertension   . Lung cancer ([place]) 09/19/11   Stage III currently in remission  . Malignant melanoma of skin of canthus of right eye ([place]) 02/2017  . Osteoporosis 04/2014   Dexa scan by Dr. Corinna Capra, T-score -2.7, next in 2 years, with at least 13 % major fracture in 10 years  . Pitting edema    Bilateral lower extremities, duplex ultrasound 04/03/2015 normal, no DVT    Current Outpatient Medications on File Prior to Visit  Medication Sig Dispense Refill  . ALPRAZolam (XANAX) 0.5 MG tablet Take 1-2 tablets (0.5-1 mg total) by mouth daily as needed for anxiety. 40 tablet 2  . aspirin EC 81 MG tablet Take 81 mg daily by mouth.    . Cholecalciferol (VITAMIN D3) 2000 UNITS capsule Take 4,000 Units by mouth daily.     . citalopram (CELEXA) 40 MG tablet Take 1 tablet (40 mg total) by mouth daily. 30 tablet 1  . ezetimibe (ZETIA) 10 MG tablet Take 1 tablet (10 mg total) by mouth daily. 30 tablet 1  . lisinopril (PRINIVIL,ZESTRIL) 20 MG tablet Take 1 tablet (20 mg total) by mouth daily. 30 tablet 1  . Loperamide HCl (IMODIUM PO) Take 3 mLs by mouth daily. Reported on 02/07/2016    . meclizine (ANTIVERT) 25 MG tablet Take 1 tablet  (25 mg total) by mouth 3 (three) times daily as needed for dizziness. (Patient not taking: Reported on 07/09/2017) 21 tablet 0  . raloxifene (EVISTA) 60 MG tablet 20 mg daily  12  . rosuvastatin (CRESTOR) 40 MG tablet Take 1 tablet (40 mg total) by mouth at bedtime. 30 tablet 1  . [DISCONTINUED] sucralfate (CARAFATE) 1 G tablet Take 1 tablet (1 g total) by mouth 4 (four) times daily. Dissolve tablet in 15cc water. 120 tablet 1   No current facility-administered medications on file prior to visit.     Past Surgical History:  Procedure Laterality Date  . CHOLECYSTECTOMY  04/2011   biliary stent placement  . [place]  2015  . MOHS SURGERY Right 02/2017   cheek and eyelid  . Josph Macho Touch     Vaginal procedure  . TUBAL LIGATION    . VIDEO BRONCHOSCOPY WITH ENDOBRONCHIAL ULTRASOUND N/A 09/19/2011   Performed by Nicanor Alcon, MD at [facility] OR    Allergies  Allergen Reactions  . Codeine Nausea And Vomiting  . Penicillins Rash    Social History   Socioeconomic History  . Marital status: Divorced    Spouse name: Not on file  . Number of children: 2  . Years of education: Not on file  . Highest education level:  Not on file  Social Needs  . Financial resource strain: Not on file  . Food insecurity - worry: Not on file  . Food insecurity - inability: Not on file  . Transportation needs - medical: Not on file  . Transportation needs - non-medical: Not on file  Occupational History  . Occupation: retired, Recruitment consultant  . Smoking status: Former Smoker    Types: Cigarettes    Last attempt to quit: 09/08/2011    Years since quitting: 5.8  . Smokeless tobacco: Never Used  Substance and Sexual Activity  . Alcohol use: Yes    Comment: occasional  . Drug use: No  . Sexual activity: Not on file  Other Topics Concern  . Not on file  Social History Narrative   divorced, 2 kids, lives by herself, has a boyfriend               Family History  Problem  Relation Age of Onset  . Bladder Cancer Father   . Stroke Father   . Colon polyps Father   . Atrial fibrillation Mother   . Heart disease Mother        CHF  . Clotting disorder Mother        warfarin  . Colon polyps Mother   . Colon cancer Maternal Aunt 57  . Colon polyps Sister   . Breast cancer Neg Hx     BP (!) 134/97   Pulse 94   Ht 5\' 3"  (1.6 m)   Wt 163 lb 3.2 oz (74 kg)   BMI 28.91 kg/m   Review of Systems: See HPI above.     Objective:  Physical Exam:  Gen: NAD, comfortable in exam room  Right shoulder: No swelling, ecchymoses.  No gross deformity. No TTP. FROM with painful arc. Positive Hawkins, Neers. Negative Yergasons. Strength 5/5 with empty can and resisted internal/external rotation.  Pain empty can. Negative apprehension. NV intact distally.  Left shoulder: No swelling, ecchymoses.  No gross deformity. No TTP. FROM. Strength 5/5 with empty can and resisted internal/external rotation. NV intact distally.   Assessment & Plan:  1. Right shoulder pain - 2/2 rotator cuff impingement.  Shown home exercise program to do daily.  Aleve or ibuprofen for 7-10 days then as needed.  Consider injection, physical therapy, imaging, nitro patches if not improving.  F/u in 6 weeks.

## 2017-08-04 LAB — HM PAP SMEAR

## 2017-08-13 ENCOUNTER — Ambulatory Visit: Payer: BLUE CROSS/BLUE SHIELD | Admitting: Family Medicine

## 2017-08-13 ENCOUNTER — Encounter: Payer: Self-pay | Admitting: Family Medicine

## 2017-08-13 VITALS — Ht 64.0 in | Wt 160.1 lb

## 2017-08-13 DIAGNOSIS — M25511 Pain in right shoulder: Secondary | ICD-10-CM

## 2017-08-13 NOTE — Assessment & Plan Note (Signed)
2/2 rotator cuff impingement.  She will start physical therapy and continue home exercises.  Aleve as needed.  Consider nitro patches, injection, imaging if not improving.  F/u in 6 weeks.

## 2017-08-13 NOTE — Patient Instructions (Signed)
You have rotator cuff impingement Avoid painful activities (overhead activities, lifting with extended arm) as much as possible at home and at the gym. Aleve 2 tabs twice a day with food OR ibuprofen 3 tabs three times a day with food for pain and inflammation as needed. Can take tylenol in addition to this. Subacromial injection may be beneficial to help with pain and to decrease inflammation. Start physical therapy. Do home exercises on days you don't go to therapy. If not improving at follow-up we will consider imaging, injection, and/or nitro patches. Follow up with me in 6 weeks.

## 2017-08-13 NOTE — Progress Notes (Signed)
PCP and consultation requested by: Colon Branch, MD  Subjective:   HPI: Patient is a 66 y.o. female here for right shoulder pain.  11/15: Patient denies known injury or trauma. She reports having about 2 months of lateral right shoulder pain radiating down to fingers at time. Difficulty holding door due to pain. Tried aleve only a couple times with mild benefit. Pain level 5/10 and sharp. Worse lying on right side. Tried massage without benefit. No skin changes, numbness. Right handed.  12/20: Patient reports she feels about the same as last visit. Pain level is higher now at 8/10 and sharp. Worse with extension of arm. No night pain. Aleve helps. Hard to do home exercises. No skin changes, numbness.  Past Medical History:  Diagnosis Date  . Anxiety   . Bilateral lower extremity pain   . Cholelithiasis   . Colon polyps    adenomatous  . High cholesterol   . Hx of radiation therapy 10/07/11 to 11/20/11   right lung  . Hx of radiation therapy 01/07/2012- 01/21/12   cranial irradiation  . Hypertension   . Lung cancer ([place]) 09/19/11   Stage III currently in remission  . Malignant melanoma of skin of canthus of right eye ([place]) 02/2017  . Osteoporosis 04/2014   Dexa scan by Dr. Corinna Capra, T-score -2.7, next in 2 years, with at least 13 % major fracture in 10 years  . Pitting edema    Bilateral lower extremities, duplex ultrasound 04/03/2015 normal, no DVT    Current Outpatient Medications on File Prior to Visit  Medication Sig Dispense Refill  . Olopatadine HCl 0.2 % SOLN     . ALPRAZolam (XANAX) 0.5 MG tablet Take 1-2 tablets (0.5-1 mg total) by mouth daily as needed for anxiety. 40 tablet 2  . aspirin EC 81 MG tablet Take 81 mg daily by mouth.    . Cholecalciferol (VITAMIN D3) 2000 UNITS capsule Take 4,000 Units by mouth daily.     . citalopram (CELEXA) 40 MG tablet Take 1 tablet (40 mg total) by mouth daily. 30 tablet 1  . ezetimibe (ZETIA) 10 MG tablet Take 1 tablet (10 mg  total) by mouth daily. 30 tablet 1  . lisinopril (PRINIVIL,ZESTRIL) 20 MG tablet Take 1 tablet (20 mg total) by mouth daily. 30 tablet 1  . Loperamide HCl (IMODIUM PO) Take 3 mLs by mouth daily. Reported on 02/07/2016    . meclizine (ANTIVERT) 25 MG tablet Take 1 tablet (25 mg total) by mouth 3 (three) times daily as needed for dizziness. (Patient not taking: Reported on 07/09/2017) 21 tablet 0  . raloxifene (EVISTA) 60 MG tablet 20 mg daily  12  . rosuvastatin (CRESTOR) 40 MG tablet Take 1 tablet (40 mg total) by mouth at bedtime. 30 tablet 1  . [DISCONTINUED] sucralfate (CARAFATE) 1 G tablet Take 1 tablet (1 g total) by mouth 4 (four) times daily. Dissolve tablet in 15cc water. 120 tablet 1   No current facility-administered medications on file prior to visit.     Past Surgical History:  Procedure Laterality Date  . CHOLECYSTECTOMY  04/2011   biliary stent placement  . [place]  2015  . MOHS SURGERY Right 02/2017   cheek and eyelid  . Josph Macho Touch     Vaginal procedure  . TUBAL LIGATION      Allergies  Allergen Reactions  . Codeine Nausea And Vomiting  . Penicillins Rash    Social History   Socioeconomic History  .  Marital status: Divorced    Spouse name: Not on file  . Number of children: 2  . Years of education: Not on file  . Highest education level: Not on file  Social Needs  . Financial resource strain: Not on file  . Food insecurity - worry: Not on file  . Food insecurity - inability: Not on file  . Transportation needs - medical: Not on file  . Transportation needs - non-medical: Not on file  Occupational History  . Occupation: retired, Recruitment consultant  . Smoking status: Former Smoker    Types: Cigarettes    Last attempt to quit: 09/08/2011    Years since quitting: 5.9  . Smokeless tobacco: Never Used  Substance and Sexual Activity  . Alcohol use: Yes    Comment: occasional  . Drug use: No  . Sexual activity: Not on file  Other  Topics Concern  . Not on file  Social History Narrative   divorced, 2 kids, lives by herself, has a boyfriend               Family History  Problem Relation Age of Onset  . Bladder Cancer Father   . Stroke Father   . Colon polyps Father   . Atrial fibrillation Mother   . Heart disease Mother        CHF  . Clotting disorder Mother        warfarin  . Colon polyps Mother   . Colon cancer Maternal Aunt 67  . Colon polyps Sister   . Breast cancer Neg Hx     Ht 5\' 4"  (1.626 m)   Wt 160 lb 1.6 oz (72.6 kg)   BMI 27.48 kg/m   Review of Systems: See HPI above.     Objective:  Physical Exam:  Gen: NAD, comfortable in exam room.  Right shoulder: No swelling, ecchymoses.  No gross deformity. No TTP. FROM with mild painful arc.  Pain on extension. Negative Hawkins, Neers. Negative Yergasons. Strength 5/5 with empty can and resisted internal/external rotation.  Mild pain ER. Negative apprehension. Negative o'briens. NV intact distally.  Assessment & Plan:  1. Right shoulder pain - 2/2 rotator cuff impingement.  She will start physical therapy and continue home exercises.  Aleve as needed.  Consider nitro patches, injection, imaging if not improving.  F/u in 6 weeks.

## 2017-08-20 ENCOUNTER — Other Ambulatory Visit: Payer: Self-pay

## 2017-08-20 DIAGNOSIS — E785 Hyperlipidemia, unspecified: Secondary | ICD-10-CM

## 2017-08-20 MED ORDER — ROSUVASTATIN CALCIUM 40 MG PO TABS: 40.0000 mg | ORAL_TABLET | Freq: Every day | ORAL | 0 refills | Status: DC

## 2017-08-27 ENCOUNTER — Ambulatory Visit (INDEPENDENT_AMBULATORY_CARE_PROVIDER_SITE_OTHER): Payer: BLUE CROSS/BLUE SHIELD | Admitting: Physical Therapy

## 2017-08-27 ENCOUNTER — Encounter: Payer: Self-pay | Admitting: Physical Therapy

## 2017-08-27 DIAGNOSIS — M6281 Muscle weakness (generalized): Secondary | ICD-10-CM | POA: Diagnosis not present

## 2017-08-27 DIAGNOSIS — R293 Abnormal posture: Secondary | ICD-10-CM

## 2017-08-27 DIAGNOSIS — M25511 Pain in right shoulder: Secondary | ICD-10-CM | POA: Diagnosis not present

## 2017-08-27 NOTE — Patient Instructions (Addendum)
All exercise to strengthen her upper back, rhomboids, mid traps.  Do not perform overhead exercise yet. Patient has osteoporosis and curls/sit ups are contra-indicated.  To work on her core perform more pilates based exercise where the back is supported.    Toe Touch    Lie on back, legs folded to chest, arms by sides. Exhale, lowering leg to just touch toes to mat. Inhale, returning knee to chest. Keep abdominals flat, navel to spine. Repeat ____ times, alternating legs. Do ____ sessions per day.  http://pm.[email or web address]   Copyright  VHI. All rights reserved.  Single Leg Raise    Lie on back, one leg bent, other leg straight on mat. Inhale, raising straight leg toward ceiling. Keep hips on mat. Exhale, lowering leg to mat. Repeat ____ times. Repeat with other leg. Do ____ sessions per day.  http://pm.[email or web address].[email or web address]   Copyright  VHI. All rights reserved.  Single Leg Circle    Lie on back, one leg bent, other leg straight up. Inhale, circling leg across body, and exhale while circling down and around to beginning. Maintain still pelvis; avoid rocking. Keep circle small. Repeat ____ times clockwise, then counterclockwise. Repeat with other leg. Do ____ sessions per day.  http://pm.[email or web address].[email or web address]   Copyright  VHI. All rights reserved.  Scissor    Lie on back, legs straight up, hands under lower hips. Exhale, splitting legs with a pulse. Inhale, changing legs. Keep hips flat. Do not let bottom leg go below 45. Repeat ____ times. Do ____ sessions per day. NOTE: Keep navel to spine, back flat.  http://pm.[email or web address].[email or web address]   Copyright  VHI. All rights reserved.  Lower Lift    Lie on back, legs straight up, slightly turned out, hands under hips. Exhale, slowly lowering legs a few degrees. Inhale, returning. Press heels together. Knees may be slightly bent, leaving quads released. Repeat ____ times. Do ____ sessions per day. NOTE: Keep navel to spine, back  flat.  http://pm.[email or web address].us/26   Copyright  VHI. All rights reserved.  Frog    Lie on back, legs bent in, slightly turn out, heels touching. Exhale, extending legs out to 45, keeping heels touching. Inhale, bringing knees back in. Repeat ____ times. Do ____ sessions per day. BEGINNER: Keep feet parallel.  http://pm.exer.us/174   Copyright  VHI. All rights reserved.  Arm / Leg Extension: Alternate (All-Fours)    Raise right arm and opposite leg. Do not arch neck. Repeat ____ times per set. Do ____ sets per session. Do ____ sessions per day.  http://[email or web address].exer.us/110   Copyright  VHI. All rights reserved.  Bridging    Slowly raise buttocks from floor, keeping stomach tight. Repeat ____ times per set. Do ____ sets per session. Do ____ sessions per day.  http://[email or web address].us/1096   Copyright  VHI. All rights reserved.  Bridging: with Straight Leg Raise    With legs bent, lift buttocks ____ inches from floor. Then slowly extend right knee, keeping stomach tight. Repeat ____ times per set. Do ____ sets per session. Do ____ sessions per day.  http://orth.exer.us/1104   Copyright  VHI. All rights reserved.  Strengthening: Resisted Internal Rotation - perform with a towel under your elbow.     Hold tubing in left hand, elbow at side and forearm out. Rotate forearm in across body. Repeat _10___ times per set. Do _3___ sets per session. Do _1___ sessions per day.  Strengthening: Resisted External Rotation - yellow band   - with towel under elbow to " shut off" deltoid.  Hold tubing in right hand, elbow at side and forearm across body. Rotate forearm out. Repeat ___10_ times per set. Do __3__ sets per session. Do ___1_ sessions per day.  EXTENSION: Standing - Resistance Band: Stable (Active) - red band   Stand, right arm at side. Against yellow resistance band, draw arm backward, as far as possible, keeping elbow straight. Complete _3__ sets of _10__  repetitions. Perform _1__ sessions per day.   Resistive Band Rowing - red band  With resistive band anchored in door, grasp both ends. Keeping elbows bent, pull back, squeezing shoulder blades together. Hold __1__ seconds. Repeat _3x10___ times. Do __1__ sessions per day.    IONTOPHORESIS PATIENT PRECAUTIONS & CONTRAINDICATIONS:  . Redness under one or both electrodes can occur.  This characterized by a uniform redness that usually disappears within 12 hours of treatment. . Small pinhead size blisters may result in response to the drug.  Contact your physician if the problem persists more than 24 hours. . On rare occasions, iontophoresis therapy can result in temporary skin reactions such as rash, inflammation, irritation or burns.  The skin reactions may be the result of individual sensitivity to the ionic solution used, the condition of the skin at the start of treatment, reaction to the materials in the electrodes, allergies or sensitivity to dexamethasone, or a poor connection between the patch and your skin.  Discontinue using iontophoresis if you have any of these reactions and report to your therapist. . Remove the Patch or electrodes if you have any undue sensation of pain or burning during the treatment and report discomfort to your therapist. . Tell your Therapist if you have had known adverse reactions to the application of electrical current. . If using the Patch, the LED light will turn off when treatment is complete and the patch can be removed.  Approximate treatment time is 1-3 hours.  Remove the patch when light goes off or after 6 hours. . The Patch can be worn during normal activity, however excessive motion where the electrodes have been placed can cause poor contact between the skin and the electrode or uneven electrical current resulting in greater risk of skin irritation. Marland Kitchen Keep out of the reach of children.   . DO NOT use if you have a cardiac pacemaker or any other  electrically sensitive implanted device. . DO NOT use if you have a known sensitivity to dexamethasone. . DO NOT use during Magnetic Resonance Imaging (MRI). . DO NOT use over broken or compromised skin (e.g. sunburn, cuts, or acne) due to the increased risk of skin reaction. . DO NOT SHAVE over the area to be treated:  To establish good contact between the Patch and the skin, excessive hair may be clipped. . DO NOT place the Patch or electrodes on or over your eyes, directly over your heart, or brain. . DO NOT reuse the Patch or electrodes as this may cause burns to occur.

## 2017-08-27 NOTE — Therapy (Signed)
[place], [place], [address] Phone: [phone]   Fax:  [phone]  Physical Therapy Evaluation  Patient Details  Name: [name] [name] MRN: [id number] Date of Birth: 06-28-1951 Referring Provider: Dr Karlton Lemon   Encounter Date: 08/27/2017  PT End of Session - 08/27/17 1439    Visit Number  1    Number of Visits  12    Date for PT Re-Evaluation  10/08/17    PT Start Time  1440    PT Stop Time  1539    PT Time Calculation (min)  59 min    Activity Tolerance  Patient tolerated treatment well       Past Medical History:  Diagnosis Date  . Anxiety   . Bilateral lower extremity pain   . Cholelithiasis   . Colon polyps    adenomatous  . High cholesterol   . Hx of radiation therapy 10/07/11 to 11/20/11   right lung  . Hx of radiation therapy 01/07/2012- 01/21/12   cranial irradiation  . Hypertension   . Lung cancer ([place]) 09/19/11   Stage III currently in remission  . Malignant melanoma of skin of canthus of right eye ([place]) 02/2017  . Osteoporosis 04/2014   Dexa scan by Dr. Corinna Capra, T-score -2.7, next in 2 years, with at least 13 % major fracture in 10 years  . Pitting edema    Bilateral lower extremities, duplex ultrasound 04/03/2015 normal, no DVT    Past Surgical History:  Procedure Laterality Date  . CHOLECYSTECTOMY  04/2011   biliary stent placement  . [place]  2015  . MOHS SURGERY Right 02/2017   cheek and eyelid  . Josph Macho Touch     Vaginal procedure  . TUBAL LIGATION      There were no vitals filed for this visit.   Subjective Assessment - 08/27/17 1442    Subjective  Pt reports she was getting out of her bed and fell into her night stand about 2 months ago, saw MD, had Rt rib fractures and vertigo and now she has residual Rt shoulder pain.  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  Doesn't want to have surgery. Going on a trip in Feb and needs to carry/push her bags.     Currently in Pain?  No/denies not at rest    Pain Score  -- reaching behind her, overhead         [facility] PT Assessment - 08/27/17 0001      Assessment   Medical Diagnosis  Rt shoulder pain    Referring Provider  Dr Karlton Lemon    Onset Date/Surgical Date  06/27/17    Hand Dominance  Right    Next MD Visit  09/21/17    Prior Therapy  none      Precautions   Precautions  None    Precaution Comments  limit overhead lifting      Balance Screen   Has the patient fallen in the past 6 months  Yes    How many times?  1 cause of injury    Is the patient reluctant to leave their home because of a fear of falling?   No  Prior Function   Level of Independence  Independent some difficulty due to pain with upper body    Vocation  Retired    Leisure  play with her dog      Observation/Other Assessments   Focus on Therapeutic Outcomes (FOTO)   48% limited      Posture/Postural Control   Posture/Postural Control  Postural limitations    Postural Limitations  Rounded Shoulders;Forward head;Increased thoracic kyphosis      ROM / Strength   AROM / PROM / Strength  AROM;Strength      AROM   AROM Assessment Site  Shoulder;Elbow;Cervical    Right/Left Shoulder  -- bilat WNL however pain in Rt shoulder    Right/Left Elbow  -- WNL    Cervical Flexion  to chest, neck pain with all cervical motion    Cervical Extension  WNL    Cervical - Right Side Bend  WNL    Cervical - Left Side Bend  WNL    Cervical - Right Rotation  WNL    Cervical - Left Rotation  WNL      Strength   Strength Assessment Site  Shoulder;Elbow    Right/Left Shoulder  Right Lt WNL    Right Shoulder Flexion  5/5    Right Shoulder Extension  5/5    Right Shoulder ABduction  4+/5 with pain    Right Shoulder  Internal Rotation  -- 5-/5 with pain    Right Shoulder External Rotation  4+/5    Right/Left Elbow  -- WNL      Palpation   Palpation comment  very tender posterior Rt shoulder      Special Tests    Special Tests  Rotator Cuff Impingement    Rotator Cuff Impingment tests  Michel Bickers test;Empty Can test      Hawkins-Kennedy test   Findings  Positive    Side  Right      Empty Can test   Findings  Negative    Side  Right             Objective measurements completed on examination: See above findings.              PT Education - 08/27/17 1525    Education provided  Yes    Education Details  HEP and ionto    Person(s) Educated  Patient    Methods  Explanation;Demonstration;Handout    Comprehension  Returned demonstration;Verbalized understanding          PT Long Term Goals - 08/27/17 1439      PT LONG TERM GOAL #1   Title  I with advanced HEP (10/08/17)    Time  6    Period  Weeks    Status  New      PT LONG TERM GOAL #2   Title  improve FOTO =/< 35% limited ( 10/08/17)     Time  6    Period  Weeks    Status  New      PT LONG TERM GOAL #3   Title  report =/> 75% reduction of Rt shoulder pain with donning her bra ( 10/08/17)     Time  6    Period  Weeks    Status  New      PT LONG TERM GOAL #4   Title  demo Rt shoulder and upper back strength =/> 5-/5 without pain ( 10/08/17)     Time  6    Period  Weeks    Status  New             Plan - 08/27/17 1540    Clinical Impression Statement  67 yo female presents with 2 month h/o Rt shoulder pain after falling out of her bed.  She is very tender to palpation posterior Rt shoulder/teres minor/major area, has some weakness in the Rt shoulder along with pain.  She also has postural changes with rounded shoulders, increased thoracic kyphosis.  She works with a Clinical research associate twice a week and was issued HEP for her shoulder along with recommendations for core work that are safe for her due to dx of  osteoporosis.     History and Personal Factors relevant to plan of care:  osteoporosis, COPD, h/o lung CA and melanoma    Clinical Presentation  Stable    Clinical Decision Making  Low    Rehab Potential  Excellent    PT Frequency  2x / week    PT Duration  6 weeks    PT Treatment/Interventions  Iontophoresis 4mg /ml Dexamethasone;Dry needling;Manual techniques;Moist Heat;Taping;Vasopneumatic Device;Patient/family education;Ultrasound;Cryotherapy;Electrical Stimulation;Passive range of motion    PT Next Visit Plan  cont ionto posterior Rt shoulder, upper back strengthening and Rt RTC strengthening     Consulted and Agree with Plan of Care  Patient       Patient will benefit from skilled therapeutic intervention in order to improve the following deficits and impairments:  Pain, Postural dysfunction, Increased muscle spasms, Decreased strength, Impaired UE functional use  Visit Diagnosis: Acute pain of right shoulder - Plan: PT plan of care cert/re-cert  Muscle weakness (generalized) - Plan: PT plan of care cert/re-cert  Abnormal posture - Plan: PT plan of care cert/re-cert     Problem List Patient Active Problem List   Diagnosis Date Noted  . Right shoulder pain 07/14/2017  . Malignant melanoma of skin of canthus of right eye ([place]) 03/16/2017  . PCP NOTES >>>>>>>>>>>>>>> 05/22/2015  . Coronary artery calcification 02/14/2015  . Pain of lower leg 02/15/2014  . Hernia, abdominal 04/01/2013  . Depression with anxiety 04/01/2012  . Malignant neoplasm of upper lobe, bronchus or lung 09/30/2011  . High cholesterol   . Lung cancer ([place]) 09/25/2011  . Small cell lung carcinoma ([place]) 09/25/2011  . Annual physical exam 09/05/2011  . IBS -diarrhea predominant 09/05/2011  . COPD (chronic obstructive pulmonary disease) ([place]) 09/05/2011  . OSTEOPOROSIS 03/01/2010  . TOBACCO ABUSE 11/14/2008  . Dyslipidemia 05/18/2007  . Essential hypertension 05/18/2007    Jeral Pinch PT  08/27/2017,  3:47 PM  [facility] [place], [place], [address] Phone: [phone]   Fax:  [phone]  Name: [name] [name] MRN: [id number] Date of Birth: 1951/05/07

## 2017-09-01 ENCOUNTER — Encounter: Payer: BLUE CROSS/BLUE SHIELD | Admitting: Physical Therapy

## 2017-09-03 ENCOUNTER — Ambulatory Visit (INDEPENDENT_AMBULATORY_CARE_PROVIDER_SITE_OTHER): Payer: BLUE CROSS/BLUE SHIELD | Admitting: Physical Therapy

## 2017-09-03 DIAGNOSIS — M25511 Pain in right shoulder: Secondary | ICD-10-CM | POA: Diagnosis not present

## 2017-09-03 DIAGNOSIS — M6281 Muscle weakness (generalized): Secondary | ICD-10-CM

## 2017-09-03 DIAGNOSIS — R293 Abnormal posture: Secondary | ICD-10-CM

## 2017-09-03 NOTE — Patient Instructions (Signed)
Over Head Pull: Narrow Grip     [place] [phone]   On back, knees bent, feet flat, band across thighs, elbows straight but relaxed. Pull hands apart (start). Keeping elbows straight, bring arms up and over head, hands toward floor. Keep pull steady on band. Hold momentarily. Return slowly, keeping pull steady, back to start. Repeat _10__ times, 2-3  Band color __red____   [name] [name]   On back, knees bent, feet flat, left hand on left hip, right hand above left. Pull right arm DIAGONALLY (hip to shoulder) across chest. Bring right arm along head toward floor. Hold momentarily. Slowly return to starting position. Repeat _10__ times. Do with left arm. Band color _red_____   Shoulder Rotation: Double Arm   On back, knees bent, feet flat, elbows tucked at sides, bent 90, hands palms up. Pull hands apart and down toward floor, keeping elbows near sides. Hold momentarily. Slowly return to starting position. Repeat _10__ times. Band color __red____  * this can be in place of the One arm exercise.   Scapula Adduction With Pectorals, Mid-Range   Stand in doorframe with palms against frame and arms at 90. Lean forward and squeeze shoulder blades. Hold _15-30__ seconds. Repeat _2__ times per session. Do __2_ sessions per day. \

## 2017-09-03 NOTE — Therapy (Signed)
[place], [place], [address] Phone: [phone]   Fax:  [phone]  Physical Therapy Treatment  Patient Details  Name: [name] [name] MRN: [id number] Date of Birth: [birth year] Referring Provider: Dr. Karlton Lemon   Encounter Date: 09/03/2017  PT End of Session - 09/03/17 1454    Visit Number  2    Number of Visits  12    Date for PT Re-Evaluation  10/08/17    PT Start Time  1406    PT Stop Time  1446    PT Time Calculation (min)  40 min    Activity Tolerance  Patient tolerated treatment well    Behavior During Therapy  [facility] for tasks assessed/performed       Past Medical History:  Diagnosis Date  . Anxiety   . Bilateral lower extremity pain   . Cholelithiasis   . Colon polyps    adenomatous  . High cholesterol   . Hx of radiation therapy 10/07/11 to 11/20/11   right lung  . Hx of radiation therapy 01/07/2012- 01/21/12   cranial irradiation  . Hypertension   . Lung cancer ([place]) 09/19/11   Stage III currently in remission  . Malignant melanoma of skin of canthus of right eye ([place]) 02/2017  . Osteoporosis 04/2014   Dexa scan by Dr. Corinna Capra, T-score -2.7, next in 2 years, with at least 13 % major fracture in 10 years  . Pitting edema    Bilateral lower extremities, duplex ultrasound 04/03/2015 normal, no DVT    Past Surgical History:  Procedure Laterality Date  . CHOLECYSTECTOMY  04/2011   biliary stent placement  . [place]  2015  . MOHS SURGERY Right 02/2017   cheek and eyelid  . Josph Macho Touch     Vaginal procedure  . TUBAL LIGATION      There were no vitals filed for this visit.  Subjective Assessment - 09/03/17 1427    Subjective  Pt reports she is feeling a little better.  she gave HEP to her trainer to help modify her exercise routine.  She still has pain in Rt shoulder when she reached behind her, and when she raises arm to shoulder height.     Currently  in Pain?  Yes    Pain Score  5     Pain Location  Shoulder    Pain Orientation  Right    Aggravating Factors   reaching into back seat    Pain Relieving Factors  ??         [facility] PT Assessment - 09/03/17 0001      Assessment   Medical Diagnosis  Rt shoulder pain    Referring Provider  Dr. Karlton Lemon    Onset Date/Surgical Date  06/27/17    Hand Dominance  Right    Next MD Visit  09/21/17           [facility] Adult PT Treatment/Exercise - 09/03/17 0001      Shoulder Exercises: Supine   External Rotation  Strengthening;Both;10 reps;Theraband    Theraband Level (Shoulder External Rotation)  Level 2 (Red)    Flexion  Both;10 reps;Theraband overhead pull    Theraband Level (Shoulder Flexion)  Level 2 (Red)    Other Supine Exercises  sash with red band x 10 reps RUE      Shoulder Exercises: Standing   External Rotation  Both;10 reps;Theraband;Strengthening 1st set with RUE; 2nd with bilat  Theraband Level (Shoulder External Rotation)  Level 2 (Red)    Internal Rotation  Strengthening;Right;10 reps;Theraband    Theraband Level (Shoulder Internal Rotation)  Level 2 (Red)    Extension  Strengthening;Both;10 reps;Theraband    Theraband Level (Shoulder Extension)  Level 2 (Red)    Row  Both;10 reps;Theraband    Theraband Level (Shoulder Row)  Level 2 (Red)      Shoulder Exercises: ROM/Strengthening   UBE (Upper Arm Bike)  L1: 30 sec each direction  painful with backward.       Shoulder Exercises: Stretch   Other Shoulder Stretches  doorway stretch - 3 positions x 30 sec; VC and demo for proper form.       Modalities   Modalities  Iontophoresis      Iontophoresis   Type of Iontophoresis  Dexamethasone    Location  Rt posterior shoulder    Dose  1.0 cc    Time  80 mA, 6 hr patch                  PT Long Term Goals - 09/03/17 1456      PT LONG TERM GOAL #1   Title  I with advanced HEP (10/08/17)    Time  6    Period  Weeks    Status  On-going      PT  LONG TERM GOAL #2   Title  improve FOTO =/< 35% limited ( 10/08/17)     Time  6    Period  Weeks    Status  On-going      PT LONG TERM GOAL #3   Title  report =/> 75% reduction of Rt shoulder pain with donning her bra ( 10/08/17)     Time  6    Period  Weeks    Status  On-going      PT LONG TERM GOAL #4   Title  demo Rt shoulder and upper back strength =/> 5-/5 without pain ( 10/08/17)     Time  6    Period  Weeks    Status  On-going            Plan - 09/03/17 1436    Clinical Impression Statement  Pt had pain with resisted IR for Rt shoulder.  She tolerated all other exercises well, with freq cues on technique.  She had positive response to ionto treatment; repeated this today.  Progressing towards goals.     Rehab Potential  Excellent    PT Frequency  2x / week    PT Duration  6 weeks    PT Treatment/Interventions  Iontophoresis 4mg /ml Dexamethasone;Dry needling;Manual techniques;Moist Heat;Taping;Vasopneumatic Device;Patient/family education;Ultrasound;Cryotherapy;Electrical Stimulation;Passive range of motion    PT Next Visit Plan  cont ionto posterior Rt shoulder, upper back strengthening and Rt RTC strengthening     Consulted and Agree with Plan of Care  Patient       Patient will benefit from skilled therapeutic intervention in order to improve the following deficits and impairments:  Pain, Postural dysfunction, Increased muscle spasms, Decreased strength, Impaired UE functional use  Visit Diagnosis: Acute pain of right shoulder  Muscle weakness (generalized)  Abnormal posture     Problem List Patient Active Problem List   Diagnosis Date Noted  . Right shoulder pain 07/14/2017  . Malignant melanoma of skin of canthus of right eye ([place]) 03/16/2017  . PCP NOTES >>>>>>>>>>>>>>> 05/22/2015  . Coronary artery calcification 02/14/2015  . Pain of lower leg 02/15/2014  .  Hernia, abdominal 04/01/2013  . Depression with anxiety 04/01/2012  . Malignant neoplasm of  upper lobe, bronchus or lung 09/30/2011  . High cholesterol   . Lung cancer ([place]) 09/25/2011  . Small cell lung carcinoma ([place]) 09/25/2011  . Annual physical exam 09/05/2011  . IBS -diarrhea predominant 09/05/2011  . COPD (chronic obstructive pulmonary disease) ([place]) 09/05/2011  . OSTEOPOROSIS 03/01/2010  . TOBACCO ABUSE 11/14/2008  . Dyslipidemia 05/18/2007  . Essential hypertension 05/18/2007   Kerin Perna, PTA 09/03/17 6:21 PM  [place], [place], [address] Phone: [phone]   Fax:  [phone]  Name: [name] [name] MRN: [id number] Date of Birth: 03-26-1951

## 2017-09-08 ENCOUNTER — Ambulatory Visit: Payer: BLUE CROSS/BLUE SHIELD | Admitting: Physical Therapy

## 2017-09-08 ENCOUNTER — Encounter: Payer: Self-pay | Admitting: Physical Therapy

## 2017-09-08 DIAGNOSIS — M6281 Muscle weakness (generalized): Secondary | ICD-10-CM | POA: Diagnosis not present

## 2017-09-08 DIAGNOSIS — M25511 Pain in right shoulder: Secondary | ICD-10-CM

## 2017-09-08 DIAGNOSIS — R293 Abnormal posture: Secondary | ICD-10-CM

## 2017-09-08 NOTE — Therapy (Signed)
[place], [place], [address] Phone: [phone]   Fax:  [phone]  Physical Therapy Treatment  Patient Details  Name: [name] [name] MRN: [id number] Date of Birth: November 15, 1950 Referring Provider: Dr. Karlton Lemon   Encounter Date: 09/08/2017  PT End of Session - 09/08/17 1241    Visit Number  3    Number of Visits  12    Date for PT Re-Evaluation  10/08/17    PT Start Time  1150    PT Stop Time  1240    PT Time Calculation (min)  50 min    Activity Tolerance  Patient tolerated treatment well    Behavior During Therapy  [facility] for tasks assessed/performed       Past Medical History:  Diagnosis Date  . Anxiety   . Bilateral lower extremity pain   . Cholelithiasis   . Colon polyps    adenomatous  . High cholesterol   . Hx of radiation therapy 10/07/11 to 11/20/11   right lung  . Hx of radiation therapy 01/07/2012- 01/21/12   cranial irradiation  . Hypertension   . Lung cancer ([place]) 09/19/11   Stage III currently in remission  . Malignant melanoma of skin of canthus of right eye ([place]) 02/2017  . Osteoporosis 04/2014   Dexa scan by Dr. Corinna Capra, T-score -2.7, next in 2 years, with at least 13 % major fracture in 10 years  . Pitting edema    Bilateral lower extremities, duplex ultrasound 04/03/2015 normal, no DVT    Past Surgical History:  Procedure Laterality Date  . CHOLECYSTECTOMY  04/2011   biliary stent placement  . [place]  2015  . MOHS SURGERY Right 02/2017   cheek and eyelid  . Josph Macho Touch     Vaginal procedure  . TUBAL LIGATION      There were no vitals filed for this visit.  Subjective Assessment - 09/08/17 1158    Subjective  Pt reports she feels similar to last visit; "Still hurts to reach in back seat and open the door".  She is unsure if she wants to keep going to trainer, "I'm not sure if she's helping or hurting me".     Patient Stated Goals   settle the bursitis and impingement in her shoulder and make it better.  Doesn't want to have surgery. Going on a trip in Feb and needs to carry/push her bags.     Currently in Pain?  No/denies no pain at rest.     Pain Score  0-No pain 0/93 with certain motions         [facility] PT Assessment - 09/08/17 0001      Assessment   Medical Diagnosis  Rt shoulder pain    Referring Provider  Dr. Karlton Lemon    Onset Date/Surgical Date  06/27/17    Hand Dominance  Right    Next MD Visit  09/21/17        [facility] Adult PT Treatment/Exercise - 09/08/17 0001      Shoulder Exercises: Supine   Horizontal ABduction  Strengthening;Both;10 reps;Theraband 2 sets    Theraband Level (Shoulder Horizontal ABduction)  Level 2 (Red)    External Rotation  Strengthening;Both;10 reps;Theraband 2 sets    Theraband Level (Shoulder External Rotation)  Level 2 (Red)    Flexion  Both;10 reps;Theraband overhead pull, 2 sets    Theraband Level (Shoulder Flexion)  Level 2 (Red)    Other Supine  Exercises  sash with red band x 10 reps RUE, 2 sets    Other Supine Exercises  hooklying on purple pool noodle:  snow angels to tolerance x 10 reps       Shoulder Exercises: ROM/Strengthening   UBE (Upper Arm Bike)  L1: 2 min forward, 1 min backward.       Shoulder Exercises: Stretch   Other Shoulder Stretches  doorway stretch - 3 positions x 30 sec; minor cues for form.  2 sets.     Other Shoulder Stretches  Rt IR stretch with towel x 10 reps of 5-10 reps.       Modalities   Modalities  Iontophoresis      Iontophoresis   Type of Iontophoresis  Dexamethasone    Location  Rt posterior shoulder    Dose  1.3cc    Time  40 mA patch; 14 hrs       Manual Therapy   Manual Therapy  Soft tissue mobilization    Soft tissue mobilization  TPR with contract/relax to Rt infraspinatus and teres minor.              PT Education - 09/08/17 1237    Education provided  Yes    Education Details  HEP, added IR stretch     Person(s) Educated  Patient    Methods  Explanation;Handout;Demonstration;Tactile cues;Verbal cues    Comprehension  Returned demonstration;Verbalized understanding          PT Long Term Goals - 09/08/17 1218      PT LONG TERM GOAL #1   Title  I with advanced HEP (10/08/17)    Time  6    Period  Weeks    Status  On-going      PT LONG TERM GOAL #2   Title  improve FOTO =/< 35% limited ( 10/08/17)     Time  6    Period  Weeks    Status  On-going      PT LONG TERM GOAL #3   Title  report =/> 75% reduction of Rt shoulder pain with donning her bra ( 10/08/17)     Time  6    Period  Weeks    Status  On-going      PT LONG TERM GOAL #4   Title  demo Rt shoulder and upper back strength =/> 5-/5 without pain ( 10/08/17)     Time  6    Period  Weeks    Status  On-going            Plan - 09/08/17 1242    Clinical Impression Statement  Pt able to tolerate UBE better than last session.  Pt was sore with IR stretch; added to HEP.  Pt point tender with palpation to Rt infraspinatus and teres minor; reduced with TPR.  Pt making gradual gains towards goals.     Rehab Potential  Excellent    PT Frequency  2x / week    PT Duration  6 weeks    PT Treatment/Interventions  Iontophoresis 4mg /ml Dexamethasone;Dry needling;Manual techniques;Moist Heat;Taping;Vasopneumatic Device;Patient/family education;Ultrasound;Cryotherapy;Electrical Stimulation;Passive range of motion    PT Next Visit Plan  cont ionto posterior Rt shoulder, upper back strengthening and Rt RTC strengthening. manual therapy.     Consulted and Agree with Plan of Care  Patient       Patient will benefit from skilled therapeutic intervention in order to improve the following deficits and impairments:  Pain, Postural dysfunction, Increased muscle spasms,  Decreased strength, Impaired UE functional use  Visit Diagnosis: Acute pain of right shoulder  Muscle weakness (generalized)  Abnormal posture     Problem  List Patient Active Problem List   Diagnosis Date Noted  . Right shoulder pain 07/14/2017  . Malignant melanoma of skin of canthus of right eye ([place]) 03/16/2017  . PCP NOTES >>>>>>>>>>>>>>> 05/22/2015  . Coronary artery calcification 02/14/2015  . Pain of lower leg 02/15/2014  . Hernia, abdominal 04/01/2013  . Depression with anxiety 04/01/2012  . Malignant neoplasm of upper lobe, bronchus or lung 09/30/2011  . High cholesterol   . Lung cancer ([place]) 09/25/2011  . Small cell lung carcinoma ([place]) 09/25/2011  . Annual physical exam 09/05/2011  . IBS -diarrhea predominant 09/05/2011  . COPD (chronic obstructive pulmonary disease) ([place]) 09/05/2011  . OSTEOPOROSIS 03/01/2010  . TOBACCO ABUSE 11/14/2008  . Dyslipidemia 05/18/2007  . Essential hypertension 05/18/2007   Kerin Perna, PTA 09/08/17 12:48 PM  [place], [place], [address] Phone: [phone]   Fax:  [phone]  Name: [name] [name] MRN: [id number] Date of Birth: 1951-07-23

## 2017-09-08 NOTE — Patient Instructions (Signed)
Internal Rotation (Passive)    Bring hand behind back, using other hand to assist. Hold __5__ seconds. Repeat _5-10___ times. Do __1__ sessions per day.   [facility] [place], [place] [address]  [phone] (office) [phone] (fax)

## 2017-09-10 ENCOUNTER — Encounter: Payer: Self-pay | Admitting: Physical Therapy

## 2017-09-10 ENCOUNTER — Ambulatory Visit: Payer: BLUE CROSS/BLUE SHIELD | Admitting: Physical Therapy

## 2017-09-10 DIAGNOSIS — R293 Abnormal posture: Secondary | ICD-10-CM | POA: Diagnosis not present

## 2017-09-10 DIAGNOSIS — M25511 Pain in right shoulder: Secondary | ICD-10-CM | POA: Diagnosis not present

## 2017-09-10 DIAGNOSIS — M6281 Muscle weakness (generalized): Secondary | ICD-10-CM | POA: Diagnosis not present

## 2017-09-10 NOTE — Therapy (Signed)
[place], [place], [address] Phone: [phone]   Fax:  [phone]  Physical Therapy Treatment  Patient Details  Name: [name] [name] MRN: [id number] Date of Birth: 09-28-1950 Referring Provider: Dr. Karlton Lemon    Encounter Date: 09/10/2017  PT End of Session - 09/10/17 1455    Visit Number  4    Number of Visits  12    Date for PT Re-Evaluation  10/08/17    PT Start Time  1402    PT Stop Time  1445    PT Time Calculation (min)  43 min    Activity Tolerance  Patient tolerated treatment well    Behavior During Therapy  [facility] for tasks assessed/performed       Past Medical History:  Diagnosis Date  . Anxiety   . Bilateral lower extremity pain   . Cholelithiasis   . Colon polyps    adenomatous  . High cholesterol   . Hx of radiation therapy 10/07/11 to 11/20/11   right lung  . Hx of radiation therapy 01/07/2012- 01/21/12   cranial irradiation  . Hypertension   . Lung cancer ([place]) 09/19/11   Stage III currently in remission  . Malignant melanoma of skin of canthus of right eye ([place]) 02/2017  . Osteoporosis 04/2014   Dexa scan by Dr. Corinna Capra, T-score -2.7, next in 2 years, with at least 13 % major fracture in 10 years  . Pitting edema    Bilateral lower extremities, duplex ultrasound 04/03/2015 normal, no DVT    Past Surgical History:  Procedure Laterality Date  . CHOLECYSTECTOMY  04/2011   biliary stent placement  . [place]  2015  . MOHS SURGERY Right 02/2017   cheek and eyelid  . Josph Macho Touch     Vaginal procedure  . TUBAL LIGATION      There were no vitals filed for this visit.  Subjective Assessment - 09/10/17 1809    Subjective  Pt reports things are going well.  She's still having trouble getting her jacket and sports bras on.     Currently in Pain?  No/denies at rest.      Pain Location  Shoulder    Pain Orientation  Right    Aggravating Factors    reaching into back seat    Pain Relieving Factors  ?         [facility] PT Assessment - 09/10/17 0001      Assessment   Medical Diagnosis  Rt shoulder pain    Referring Provider  Dr. Karlton Lemon     Onset Date/Surgical Date  06/27/17    Hand Dominance  Right    Next MD Visit  09/21/17                  [facility] Adult PT Treatment/Exercise - 09/10/17 0001      Shoulder Exercises: Supine   Horizontal ABduction  Strengthening    Flexion  Both;10 reps;Theraband overhead pull    Theraband Level (Shoulder Flexion)  Level 2 (Red)    Other Supine Exercises  sash with red band x 10 reps RUE, repeated with green band.       Shoulder Exercises: Sidelying   Other Sidelying Exercises  Rt sleeper stretch x 15 sec x 2 reps      Shoulder Exercises: Standing   Horizontal ABduction  Strengthening;Both;Theraband 2 reps; painful in Rt post shoulder; switched to supine    Theraband Level (  Shoulder Horizontal ABduction)  Level 1 (Yellow)    External Rotation  Strengthening;Both;10 reps;Theraband    Theraband Level (Shoulder External Rotation)  Level 1 (Yellow)    Internal Rotation  AAROM;Both;10 reps dowel behind back    Extension  AAROM;Both;10 reps dowel      Shoulder Exercises: ROM/Strengthening   UBE (Upper Arm Bike)  L1: (standing) 2 min forward, 1.5 min backward.       Shoulder Exercises: Stretch   Other Shoulder Stretches  doorway stretch - 3 positions x 30 sec; 2 sets.     Other Shoulder Stretches  trial of Rt shoulder ext stretch on door frame, not tolerated - switched to AAROM with dowel.  IR stretch with strap over shoulder x 10 reps RUE      Iontophoresis   Type of Iontophoresis  Dexamethasone    Location  Rt posterior shoulder    Dose  1.00    Time  80 mA, 6 hr patch      Manual Therapy   Manual Therapy  Soft tissue mobilization    Soft tissue mobilization  TPR with contract/relax to Rt infraspinatus and teres minor.                   PT Long Term Goals -  09/10/17 1430      PT LONG TERM GOAL #1   Title  I with advanced HEP (10/08/17)    Time  6    Period  Weeks    Status  On-going      PT LONG TERM GOAL #2   Title  improve FOTO =/< 35% limited ( 10/08/17)     Time  6    Period  Weeks    Status  On-going      PT LONG TERM GOAL #3   Title  report =/> 75% reduction of Rt shoulder pain with donning her bra ( 10/08/17)     Time  6    Period  Weeks    Status  On-going improving      PT LONG TERM GOAL #4   Title  demo Rt shoulder and upper back strength =/> 5-/5 without pain ( 10/08/17)     Time  6    Period  Weeks    Status  On-going            Plan - 09/10/17 1807    Clinical Impression Statement  Pt tolerating increases reps and resistance than last visit.  Pt continues to make steady gains towards established goals.     Rehab Potential  Excellent    PT Frequency  2x / week    PT Duration  6 weeks    PT Treatment/Interventions  Iontophoresis 4mg /ml Dexamethasone;Dry needling;Manual techniques;Moist Heat;Taping;Vasopneumatic Device;Patient/family education;Ultrasound;Cryotherapy;Electrical Stimulation;Passive range of motion    PT Next Visit Plan  cont ionto posterior Rt shoulder, upper back strengthening and Rt RTC strengthening. manual therapy.     Consulted and Agree with Plan of Care  Patient       Patient will benefit from skilled therapeutic intervention in order to improve the following deficits and impairments:  Pain, Postural dysfunction, Increased muscle spasms, Decreased strength, Impaired UE functional use  Visit Diagnosis: Acute pain of right shoulder  Muscle weakness (generalized)  Abnormal posture     Problem List Patient Active Problem List   Diagnosis Date Noted  . Right shoulder pain 07/14/2017  . Malignant melanoma of skin of canthus of right eye ([place]) 03/16/2017  .  PCP NOTES >>>>>>>>>>>>>>> 05/22/2015  . Coronary artery calcification 02/14/2015  . Pain of lower leg 02/15/2014  . Hernia,  abdominal 04/01/2013  . Depression with anxiety 04/01/2012  . Malignant neoplasm of upper lobe, bronchus or lung 09/30/2011  . High cholesterol   . Lung cancer ([place]) 09/25/2011  . Small cell lung carcinoma ([place]) 09/25/2011  . Annual physical exam 09/05/2011  . IBS -diarrhea predominant 09/05/2011  . COPD (chronic obstructive pulmonary disease) ([place]) 09/05/2011  . OSTEOPOROSIS 03/01/2010  . TOBACCO ABUSE 11/14/2008  . Dyslipidemia 05/18/2007  . Essential hypertension 05/18/2007   Kerin Perna, PTA 09/10/17 6:10 PM  [facility] [place], [place], [address] Phone: [phone]   Fax:  [phone]  Name: [name] [name] MRN: [id number] Date of Birth: Jul 26, 1951

## 2017-09-10 NOTE — Patient Instructions (Signed)
Cane Exercise: Extension / Internal Rotation    Stand holding cane behind back with both hands palm-up. Slide cane up spine toward head.  Perform 10 x

## 2017-09-13 ENCOUNTER — Other Ambulatory Visit: Payer: Self-pay | Admitting: Internal Medicine

## 2017-09-13 DIAGNOSIS — E785 Hyperlipidemia, unspecified: Secondary | ICD-10-CM

## 2017-09-15 ENCOUNTER — Ambulatory Visit: Payer: BLUE CROSS/BLUE SHIELD | Admitting: Physical Therapy

## 2017-09-15 ENCOUNTER — Encounter: Payer: Self-pay | Admitting: Physical Therapy

## 2017-09-15 DIAGNOSIS — M25511 Pain in right shoulder: Secondary | ICD-10-CM

## 2017-09-15 DIAGNOSIS — M6281 Muscle weakness (generalized): Secondary | ICD-10-CM

## 2017-09-15 DIAGNOSIS — R293 Abnormal posture: Secondary | ICD-10-CM | POA: Diagnosis not present

## 2017-09-15 NOTE — Therapy (Signed)
[place], [place], [address] Phone: [phone]   Fax:  [phone]  Physical Therapy Treatment  Patient Details  Name: [name] [name] MRN: [id number] Date of Birth: 12-17-1950 Referring Provider: Dr. Karlton Lemon   Encounter Date: 09/15/2017  PT End of Session - 09/15/17 1153    Visit Number  5    Number of Visits  12    Date for PT Re-Evaluation  10/08/17    PT Start Time  2330    PT Stop Time  1237    PT Time Calculation (min)  49 min    Activity Tolerance  Patient tolerated treatment well    Behavior During Therapy  [facility] for tasks assessed/performed       Past Medical History:  Diagnosis Date  . Anxiety   . Bilateral lower extremity pain   . Cholelithiasis   . Colon polyps    adenomatous  . High cholesterol   . Hx of radiation therapy 10/07/11 to 11/20/11   right lung  . Hx of radiation therapy 01/07/2012- 01/21/12   cranial irradiation  . Hypertension   . Lung cancer ([place]) 09/19/11   Stage III currently in remission  . Malignant melanoma of skin of canthus of right eye ([place]) 02/2017  . Osteoporosis 04/2014   Dexa scan by Dr. Corinna Capra, T-score -2.7, next in 2 years, with at least 13 % major fracture in 10 years  . Pitting edema    Bilateral lower extremities, duplex ultrasound 04/03/2015 normal, no DVT    Past Surgical History:  Procedure Laterality Date  . CHOLECYSTECTOMY  04/2011   biliary stent placement  . [place]  2015  . MOHS SURGERY Right 02/2017   cheek and eyelid  . Josph Macho Touch     Vaginal procedure  . TUBAL LIGATION      There were no vitals filed for this visit.  Subjective Assessment - 09/15/17 1153    Subjective  "My shoulder was just giving me fits yesterday.  It hurt to reach over for my coffee pot.  I had to take Aleve".  Today she is feeling a little better.     Patient Stated Goals  settle the bursitis and impingement in her shoulder  and make it better.  Doesn't want to have surgery. Going on a trip in Feb and needs to carry/push her bags.     Currently in Pain?  No/denies at rest    Pain Score  0-No pain         [facility] PT Assessment - 09/15/17 0001      Assessment   Medical Diagnosis  Rt shoulder pain    Referring Provider  Dr. Karlton Lemon    Onset Date/Surgical Date  06/27/17    Hand Dominance  Right    Next MD Visit  09/21/17       [facility] Adult PT Treatment/Exercise - 09/15/17 0001      Shoulder Exercises: Standing   External Rotation  Strengthening;Both;12 reps    Theraband Level (Shoulder External Rotation)  Level 2 (Red)    Internal Rotation  AAROM;Both;10 reps dowel behind back    Extension  AAROM;Both;10 reps dowel    Row  Both;Theraband;20 reps    Theraband Level (Shoulder Row)  Level 3 (Green)    Other Standing Exercises  10# lift at knee height with RUE x 10,  20# lift x 2 reps, poor form - stopped.   RUE IR  stretch with AAROM of LUE x 10    Other Standing Exercises  Rt shoulder flexion ~ 40 deg with green band (to simulate lifting luggage) x 10 reps       Shoulder Exercises: ROM/Strengthening   Other ROM/Strengthening Exercises  NuStep L5: arms/legs x 5 min       Shoulder Exercises: Stretch   Other Shoulder Stretches  doorway stretch - 3 positions x 45 sec; 2 sets.       Manual Therapy   Manual Therapy  Soft tissue mobilization;Myofascial release;Taping    Soft tissue mobilization  STM to Rt tricep and posterior shoulder.  Edge tool assistance to Rt infraspinatus, teres majr, prox tricep to decrease fascial tightness and pain.     Myofascial Release  Rt pec release     Kinesiotex  Facilitate Muscle      Kinesiotix   Facilitate Muscle   I strip of reg Rock tape applied to Rt posterior shoulder and prox tricep; perpendicular strip placed on top.                   PT Long Term Goals - 09/10/17 1430      PT LONG TERM GOAL #1   Title  I with advanced HEP (10/08/17)    Time  6     Period  Weeks    Status  On-going      PT LONG TERM GOAL #2   Title  improve FOTO =/< 35% limited ( 10/08/17)     Time  6    Period  Weeks    Status  On-going      PT LONG TERM GOAL #3   Title  report =/> 75% reduction of Rt shoulder pain with donning her bra ( 10/08/17)     Time  6    Period  Weeks    Status  On-going improving      PT LONG TERM GOAL #4   Title  demo Rt shoulder and upper back strength =/> 5-/5 without pain ( 10/08/17)     Time  6    Period  Weeks    Status  On-going            Plan - 09/15/17 1248    Clinical Impression Statement  Pt continues to have pain and limitations with Rt shoulder IR; added stretches in the area to aide in flexibility.  Pt tight and tender in posterior insertion of infraspinatus and prox tricep; reduced with manual therapy. Trial of Rock tape applied to Posterior Rt shoulder/arm to decompress tissue, decrease pain and increase proprioception.  Progressing towards goals.     Rehab Potential  Excellent    PT Frequency  2x / week    PT Duration  6 weeks    PT Treatment/Interventions  Iontophoresis 4mg /ml Dexamethasone;Dry needling;Manual techniques;Moist Heat;Taping;Vasopneumatic Device;Patient/family education;Ultrasound;Cryotherapy;Electrical Stimulation;Passive range of motion    PT Next Visit Plan  assess response to tape and IASTM.  continue postural and Rt shoulder strengthening.     Consulted and Agree with Plan of Care  Patient       Patient will benefit from skilled therapeutic intervention in order to improve the following deficits and impairments:  Pain, Postural dysfunction, Increased muscle spasms, Decreased strength, Impaired UE functional use  Visit Diagnosis: Acute pain of right shoulder  Muscle weakness (generalized)  Abnormal posture     Problem List Patient Active Problem List   Diagnosis Date Noted  . Right shoulder pain 07/14/2017  .  Malignant melanoma of skin of canthus of right eye ([place]) 03/16/2017  .  PCP NOTES >>>>>>>>>>>>>>> 05/22/2015  . Coronary artery calcification 02/14/2015  . Pain of lower leg 02/15/2014  . Hernia, abdominal 04/01/2013  . Depression with anxiety 04/01/2012  . Malignant neoplasm of upper lobe, bronchus or lung 09/30/2011  . High cholesterol   . Lung cancer ([place]) 09/25/2011  . Small cell lung carcinoma ([place]) 09/25/2011  . Annual physical exam 09/05/2011  . IBS -diarrhea predominant 09/05/2011  . COPD (chronic obstructive pulmonary disease) ([place]) 09/05/2011  . OSTEOPOROSIS 03/01/2010  . TOBACCO ABUSE 11/14/2008  . Dyslipidemia 05/18/2007  . Essential hypertension 05/18/2007   Kerin Perna, PTA 09/15/17 12:58 PM  [place], [place], [address] Phone: [phone]   Fax:  [phone]  Name: NEWELL FRATER MRN: [id number] Date of Birth: April 12, 1951

## 2017-09-17 ENCOUNTER — Ambulatory Visit: Payer: BLUE CROSS/BLUE SHIELD | Admitting: Physical Therapy

## 2017-09-17 DIAGNOSIS — M25511 Pain in right shoulder: Secondary | ICD-10-CM | POA: Diagnosis not present

## 2017-09-17 DIAGNOSIS — R293 Abnormal posture: Secondary | ICD-10-CM

## 2017-09-17 DIAGNOSIS — M6281 Muscle weakness (generalized): Secondary | ICD-10-CM

## 2017-09-17 NOTE — Therapy (Addendum)
[place], [place], [address] Phone: [phone]   Fax:  [phone]  Physical Therapy Treatment  Patient Details  Name: [name] [name] MRN: [id number] Date of Birth: [birth year] Referring Provider: Dr. Karlton Lemon   Encounter Date: 09/17/2017  PT End of Session - 09/17/17 1413    Visit Number  6    Number of Visits  12    Date for PT Re-Evaluation  10/08/17    PT Start Time  1402    PT Stop Time  5643    PT Time Calculation (min)  45 min    Activity Tolerance  Patient tolerated treatment well;No increased pain    Behavior During Therapy  [facility] for tasks assessed/performed       Past Medical History:  Diagnosis Date  . Anxiety   . Bilateral lower extremity pain   . Cholelithiasis   . Colon polyps    adenomatous  . High cholesterol   . Hx of radiation therapy 10/07/11 to 11/20/11   right lung  . Hx of radiation therapy 01/07/2012- 01/21/12   cranial irradiation  . Hypertension   . Lung cancer ([place]) 09/19/11   Stage III currently in remission  . Malignant melanoma of skin of canthus of right eye ([place]) 02/2017  . Osteoporosis 04/2014   Dexa scan by Dr. Corinna Capra, T-score -2.7, next in 2 years, with at least 13 % major fracture in 10 years  . Pitting edema    Bilateral lower extremities, duplex ultrasound 04/03/2015 normal, no DVT    Past Surgical History:  Procedure Laterality Date  . CHOLECYSTECTOMY  04/2011   biliary stent placement  . [place]  2015  . MOHS SURGERY Right 02/2017   cheek and eyelid  . Josph Macho Touch     Vaginal procedure  . TUBAL LIGATION      There were no vitals filed for this visit.  Subjective Assessment - 09/17/17 1410    Subjective  pt reports she has had great relief from tape on back of Rt shoulder.  "It's amazing, my shoulder feels great".  she reports greater ease with putting sweater on and reaching activities. she had some pain with  driving, so she "babied it" some.     Patient Stated Goals  settle the bursitis and impingement in her shoulder and make it better.  Doesn't want to have surgery. Going on a trip in Feb and needs to carry/push her bags.     Currently in Pain?  No/denies         [facility] PT Assessment - 09/17/17 0001      Assessment   Medical Diagnosis  Rt shoulder pain    Referring Provider  Dr. Karlton Lemon    Onset Date/Surgical Date  06/27/17    Hand Dominance  Right    Next MD Visit  09/21/17      AROM   Overall AROM Comments  Rt shoulder IR- thumb to T10 with pain.       Strength   Right Shoulder Flexion  5/5 mild pain    Right Shoulder Extension  5/5    Right Shoulder ABduction  4/5 with pain    Right Shoulder Internal Rotation  -- 5-/5    Right Shoulder External Rotation  -- 5-/5        [facility] Adult PT Treatment/Exercise - 09/17/17 0001      Shoulder Exercises: Standing   External Rotation  Strengthening;Both;10 reps;Theraband  2 sets    Theraband Level (Shoulder External Rotation)  Level 2 (Red)    ABduction  Right;10 reps;Weights;Limitations    Shoulder ABduction Weight (lbs)  1    ABduction Limitations  2# painful, switched to 1# - to 40 deg   Row  Both;15 reps;Theraband 2 sets    Theraband Level (Shoulder Row)  Level 3 (Green)    Other Standing Exercises  D2- sash with red band x 10 with RUE.       Shoulder Exercises: Stretch   Other Shoulder Stretches  doorway stretch - 3 positions x 20 sec; 2 sets.     Other Shoulder Stretches  Rt shoulder IR AAROM stretch x 5 reps     Manual Therapy   Soft tissue mobilization  STM to Rt tricep, Rt infraspinatus, teres minor to decrease fascial tightness and pain.     Myofascial Release  Rt pec release     Kinesiotex  Facilitate Muscle      Kinesiotix   Facilitate Muscle   I strip of reg Rock tape applied to Rt posterior shoulder and prox tricep; perpendicular strip placed on top.                   PT Long Term Goals - 09/10/17  1430      PT LONG TERM GOAL #1   Title  I with advanced HEP (10/08/17)    Time  6    Period  Weeks    Status  On-going      PT LONG TERM GOAL #2   Title  improve FOTO =/< 35% limited ( 10/08/17)     Time  6    Period  Weeks    Status  On-going      PT LONG TERM GOAL #3   Title  report =/> 75% reduction of Rt shoulder pain with donning her bra ( 10/08/17)     Time  6    Period  Weeks    Status  On-going improving      PT LONG TERM GOAL #4   Title  demo Rt shoulder and upper back strength =/> 5-/5 without pain ( 10/08/17)     Time  6    Period  Weeks    Status  On-going            Plan - 09/17/17 1413    Clinical Impression Statement  Pt had positive reponse to manual therapy and Rock tape application last session.  Pt reporting less pain with functional activities. she had some tenderness and pain with stretches of Rt shoulder, but tolerated all other exercises well, including increased resistance.   Strength in arm has improved, some pain continues with ER and abduction.  Progressing towards goals.     Rehab Potential  Excellent    PT Frequency  2x / week    PT Duration  6 weeks    PT Treatment/Interventions  Iontophoresis '4mg'$ /ml Dexamethasone;Dry needling;Manual techniques;Moist Heat;Taping;Vasopneumatic Device;Patient/family education;Ultrasound;Cryotherapy;Electrical Stimulation;Passive range of motion    PT Next Visit Plan   continue postural and Rt shoulder strengthening. MD note.  FOTO.        Patient will benefit from skilled therapeutic intervention in order to improve the following deficits and impairments:  Pain, Postural dysfunction, Increased muscle spasms, Decreased strength, Impaired UE functional use  Visit Diagnosis: Acute pain of right shoulder  Muscle weakness (generalized)  Abnormal posture     Problem List Patient Active Problem List   Diagnosis  Date Noted  . Right shoulder pain 07/14/2017  . Malignant melanoma of skin of canthus of right eye  ([place]) 03/16/2017  . PCP NOTES >>>>>>>>>>>>>>> 05/22/2015  . Coronary artery calcification 02/14/2015  . Pain of lower leg 02/15/2014  . Hernia, abdominal 04/01/2013  . Depression with anxiety 04/01/2012  . Malignant neoplasm of upper lobe, bronchus or lung 09/30/2011  . High cholesterol   . Lung cancer ([place]) 09/25/2011  . Small cell lung carcinoma ([place]) 09/25/2011  . Annual physical exam 09/05/2011  . IBS -diarrhea predominant 09/05/2011  . COPD (chronic obstructive pulmonary disease) ([place]) 09/05/2011  . OSTEOPOROSIS 03/01/2010  . TOBACCO ABUSE 11/14/2008  . Dyslipidemia 05/18/2007  . Essential hypertension 05/18/2007   Kerin Perna, PTA 09/17/17 5:13 PM [facility] [place], [place], [address] Phone: [phone]   Fax:  [phone]  Name: SUKARI GRIST MRN: [id number] Date of Birth: 1951-07-16   PHYSICAL THERAPY DISCHARGE SUMMARY  Visits from Start of Care: 6 Current functional level related to goals / functional outcomes: See above for function and strength at last visit   Remaining deficits: Per MD note end of January, pt is doing well and painfree in the shoulder.    Education / Equipment: HEP Plan: Patient agrees to discharge.  Patient goals were not met.at the time of her last visit.  Patient is being discharged due to being pleased with the current functional level.She was on vacation and then did not return for treatment.   ?????    Jeral Pinch, PT 10/13/17 8:32 AM

## 2017-09-22 ENCOUNTER — Other Ambulatory Visit: Payer: BLUE CROSS/BLUE SHIELD

## 2017-09-22 ENCOUNTER — Other Ambulatory Visit: Payer: Self-pay | Admitting: *Deleted

## 2017-09-22 ENCOUNTER — Ambulatory Visit (INDEPENDENT_AMBULATORY_CARE_PROVIDER_SITE_OTHER): Payer: BLUE CROSS/BLUE SHIELD | Admitting: Internal Medicine

## 2017-09-22 DIAGNOSIS — E785 Hyperlipidemia, unspecified: Secondary | ICD-10-CM

## 2017-09-22 NOTE — Progress Notes (Signed)
Pre visit review using our clinic review tool, if applicable. No additional management support is needed unless otherwise documented below in the visit note. 

## 2017-09-23 ENCOUNTER — Encounter: Payer: Self-pay | Admitting: Internal Medicine

## 2017-09-23 ENCOUNTER — Ambulatory Visit: Payer: BLUE CROSS/BLUE SHIELD | Admitting: Internal Medicine

## 2017-09-23 VITALS — BP 120/86 | HR 88 | Ht 63.0 in | Wt 157.0 lb

## 2017-09-23 DIAGNOSIS — Z86010 Personal history of colonic polyps: Secondary | ICD-10-CM | POA: Diagnosis not present

## 2017-09-23 DIAGNOSIS — R197 Diarrhea, unspecified: Secondary | ICD-10-CM

## 2017-09-23 DIAGNOSIS — R159 Full incontinence of feces: Secondary | ICD-10-CM | POA: Diagnosis not present

## 2017-09-23 DIAGNOSIS — K625 Hemorrhage of anus and rectum: Secondary | ICD-10-CM | POA: Diagnosis not present

## 2017-09-23 MED ORDER — COLESTIPOL HCL 1 G PO TABS: 2.0000 g | ORAL_TABLET | Freq: Two times a day (BID) | ORAL | 3 refills | Status: DC

## 2017-09-23 MED ORDER — PEG-KCL-NACL-NASULF-NA ASC-C 140 G PO SOLR: 1.0000 | Freq: Once | ORAL | 0 refills | Status: AC

## 2017-09-23 NOTE — Patient Instructions (Signed)
We have sent the following medications to your pharmacy for you to pick up at your convenience:  Colestid  You have been scheduled for a colonoscopy. Please follow written instructions given to you at your visit today.  Please pick up your prep supplies at the pharmacy within the next 1-3 days. If you use inhalers (even only as needed), please bring them with you on the day of your procedure. Your physician has requested that you go to www.startemmi.com and enter the access code given to you at your visit today. This web site gives a general overview about your procedure. However, you should still follow specific instructions given to you by our office regarding your preparation for the procedure.

## 2017-09-23 NOTE — Progress Notes (Signed)
HISTORY OF PRESENT ILLNESS:  [name] [name] is a 67 y.o. female with past medical history as listed below. She presents herself today for evaluation of problems with diarrhea and fecal incontinence. Also rectal bleeding. Patient has not been seen in this office since May 2015. She has a history of hypertension, lung cancer, adenomatous colon polyps, and prior colonoscopy. She has been evaluated previously for loose stools with urgency and incontinence. Last colonoscopy October 2014. She was found to have adenomatous colon polyp with follow-up in 5 years recommended. Hence time for last visit she reported 1 bowel movement per day but could have 2-3 incontinent episodes per week. Problem seems worse since her cholecystectomy. She continues to take Imodium daily. She was prescribed Questran which resulted in constipation. She since stopped using Questran for some time. Now reports 2 loose bowel movements per day with incontinent episodes several times per week. Recently noticed blood with loose stools transiently. Is going away for a nine-day vacation this Saturday. Hoping for suggestions. She did course of Xifaxan previously at her request. This did not help. She is known to have poor rectal tone.  REVIEW OF SYSTEMS:  All non-GI ROS negative unless otherwise stated in the history of present illness except for increased thirst  Past Medical History:  Diagnosis Date  . Anxiety   . Bilateral lower extremity pain   . Cholelithiasis   . Colon polyps    adenomatous  . High cholesterol   . Hx of radiation therapy 10/07/11 to 11/20/11   right lung  . Hx of radiation therapy 01/07/2012- 01/21/12   cranial irradiation  . Hypertension   . Lung cancer ([place]) 09/19/11   Stage III currently in remission  . Malignant melanoma of skin of canthus of right eye ([place]) 02/2017  . Osteoporosis 04/2014   Dexa scan by Dr. Corinna Capra, T-score -2.7, next in 2 years, with at least 13 % major fracture in 10 years  . Pitting  edema    Bilateral lower extremities, duplex ultrasound 04/03/2015 normal, no DVT    Past Surgical History:  Procedure Laterality Date  . CHOLECYSTECTOMY  04/2011   biliary stent placement  . [place]  2015  . MOHS SURGERY Right 02/2017   cheek and eyelid  . Josph Macho Touch     Vaginal procedure  . TUBAL LIGATION      Social History [name] [name]  reports that she quit smoking about 6 years ago. Her smoking use included cigarettes. she has never used smokeless tobacco. She reports that she drinks alcohol. She reports that she does not use drugs.  family history includes Atrial fibrillation in her mother; Bladder Cancer in her father; Clotting disorder in her mother; Colon cancer (age of onset: 76) in her maternal aunt; Colon polyps in her father, mother, and sister; Heart disease in her mother; Stroke in her father.  Allergies  Allergen Reactions  . Codeine Nausea And Vomiting  . Penicillins Rash       PHYSICAL EXAMINATION: Vital signs: BP 120/86   Pulse 88   Ht 5\' 3"  (1.6 m)   Wt 157 lb (71.2 kg)   BMI 27.81 kg/m   Constitutional: generally well-appearing, no acute distress Psychiatric: alert and oriented x3, cooperative Eyes: extraocular movements intact, anicteric, conjunctiva pink Mouth: oral pharynx moist, no lesions Neck: supple no lymphadenopathy Cardiovascular: heart regular rate and rhythm, no murmur Lungs: clear to auscultation bilaterally Abdomen: soft, nontender, nondistended, no obvious ascites, no peritoneal signs, normal bowel  sounds, no organomegaly Rectal:omitted. We'll repeat at time of upcoming colonoscopy Extremities: no clubbing, cyanosis, or lower extremity edema bilaterally Skin: no lesions on visible extremities Neuro: No focal deficits. Cranial nerves intact  ASSESSMENT:  #1. Chronic loose stools. Suspect bile salt related based on previous response to Questran #2. Incontinence secondary to decreased rectal tone #3.  History of adenomatous colon polyps. Last colonoscopy 5 years ago. Due for surveillance this year   PLAN:  #1.Colestid 2 g twice daily. We prescribed this today.Titrate to achieve desired effect #2. May be more liberal with Imodium #3. Continue protective undergarments #4. Schedule colonoscopy with biopsies to rule out microscopic colitis and provide neoplasia surveillance after she returns from her trip.The nature of the procedure, as well as the risks, benefits, and alternatives were carefully and thoroughly reviewed with the patient. Ample time for discussion and questions allowed. The patient understood, was satisfied, and agreed to proceed. #5. She may benefit from evaluation regarding possible sacral nerve stimulator for her chronic incontinence.

## 2017-09-24 ENCOUNTER — Ambulatory Visit: Payer: BLUE CROSS/BLUE SHIELD | Admitting: Family Medicine

## 2017-09-24 ENCOUNTER — Encounter: Payer: Self-pay | Admitting: Family Medicine

## 2017-09-24 ENCOUNTER — Other Ambulatory Visit (INDEPENDENT_AMBULATORY_CARE_PROVIDER_SITE_OTHER): Payer: BLUE CROSS/BLUE SHIELD

## 2017-09-24 DIAGNOSIS — E785 Hyperlipidemia, unspecified: Secondary | ICD-10-CM | POA: Diagnosis not present

## 2017-09-24 DIAGNOSIS — M25511 Pain in right shoulder: Secondary | ICD-10-CM

## 2017-09-24 LAB — AST: AST: 16 U/L (ref 0–37)

## 2017-09-24 LAB — LIPID PANEL
CHOLESTEROL: 334 mg/dL — AB (ref 0–200)
HDL: 73.3 mg/dL (ref >39.00)
LDL Cholesterol: 229 mg/dL — ABNORMAL HIGH (ref 0–99)
NonHDL: 260.55
Total CHOL/HDL Ratio: 5
Triglycerides: 156 mg/dL — ABNORMAL HIGH (ref 0.0–149.0)
VLDL: 31.2 mg/dL (ref 0.0–40.0)

## 2017-09-24 LAB — ALT: ALT: 10 U/L (ref 0–35)

## 2017-09-24 NOTE — Progress Notes (Signed)
PCP and consultation requested by: Colon Branch, MD  Subjective:   HPI: Patient is a 67 y.o. female here for right shoulder pain.  11/15: Patient denies known injury or trauma. She reports having about 2 months of lateral right shoulder pain radiating down to fingers at time. Difficulty holding door due to pain. Tried aleve only a couple times with mild benefit. Pain level 5/10 and sharp. Worse lying on right side. Tried massage without benefit. No skin changes, numbness. Right handed.  12/20: Patient reports she feels about the same as last visit. Pain level is higher now at 8/10 and sharp. Worse with extension of arm. No night pain. Aleve helps. Hard to do home exercises. No skin changes, numbness.  09/24/17: Patient reports she's doing much better. Pain level is 0/10. No night pain, not needing any medicines. Doing well with physical therapy and home exercises. No skin changes, numbness.  Past Medical History:  Diagnosis Date  . Anxiety   . Bilateral lower extremity pain   . Cholelithiasis   . Colon polyps    adenomatous  . High cholesterol   . Hx of radiation therapy 10/07/11 to 11/20/11   right lung  . Hx of radiation therapy 01/07/2012- 01/21/12   cranial irradiation  . Hypertension   . Lung cancer ([place]) 09/19/11   Stage III currently in remission  . Malignant melanoma of skin of canthus of right eye ([place]) 02/2017  . Osteoporosis 04/2014   Dexa scan by Dr. Corinna Capra, T-score -2.7, next in 2 years, with at least 13 % major fracture in 10 years  . Pitting edema    Bilateral lower extremities, duplex ultrasound 04/03/2015 normal, no DVT    Current Outpatient Medications on File Prior to Visit  Medication Sig Dispense Refill  . ALPRAZolam (XANAX) 0.5 MG tablet Take 1-2 tablets (0.5-1 mg total) by mouth daily as needed for anxiety. 40 tablet 2  . aspirin EC 81 MG tablet Take 81 mg daily by mouth.    . Cholecalciferol (VITAMIN D3) 2000 UNITS capsule Take 4,000 Units by  mouth daily.     . citalopram (CELEXA) 40 MG tablet Take 1 tablet (40 mg total) by mouth daily. 30 tablet 5  . colestipol (COLESTID) 1 g tablet Take 2 tablets (2 g total) by mouth 2 (two) times daily. 120 tablet 3  . ezetimibe (ZETIA) 10 MG tablet Take 1 tablet (10 mg total) by mouth daily. 30 tablet 5  . lisinopril (PRINIVIL,ZESTRIL) 20 MG tablet Take 1 tablet (20 mg total) by mouth daily. 30 tablet 5  . Loperamide HCl (IMODIUM PO) Take 3 mLs by mouth daily. Reported on 02/07/2016    . meclizine (ANTIVERT) 25 MG tablet Take 1 tablet (25 mg total) by mouth 3 (three) times daily as needed for dizziness. 21 tablet 0  . Olopatadine HCl 0.2 % SOLN     . raloxifene (EVISTA) 60 MG tablet 20 mg daily  12  . rosuvastatin (CRESTOR) 40 MG tablet Take 1 tablet (40 mg total) by mouth at bedtime. 90 tablet 0  . [DISCONTINUED] sucralfate (CARAFATE) 1 G tablet Take 1 tablet (1 g total) by mouth 4 (four) times daily. Dissolve tablet in 15cc water. 120 tablet 1   No current facility-administered medications on file prior to visit.     Past Surgical History:  Procedure Laterality Date  . CHOLECYSTECTOMY  04/2011   biliary stent placement  . [place]  2015  . MOHS SURGERY Right 02/2017  cheek and eyelid  . Josph Macho Touch     Vaginal procedure  . TUBAL LIGATION      Allergies  Allergen Reactions  . Codeine Nausea And Vomiting  . Penicillins Rash    Social History   Socioeconomic History  . Marital status: Divorced    Spouse name: Not on file  . Number of children: 2  . Years of education: Not on file  . Highest education level: Not on file  Social Needs  . Financial resource strain: Not on file  . Food insecurity - worry: Not on file  . Food insecurity - inability: Not on file  . Transportation needs - medical: Not on file  . Transportation needs - non-medical: Not on file  Occupational History  . Occupation: retired, Recruitment consultant  . Smoking status: Former Smoker     Types: Cigarettes    Last attempt to quit: 09/08/2011    Years since quitting: 6.0  . Smokeless tobacco: Never Used  Substance and Sexual Activity  . Alcohol use: Yes    Comment: occasional  . Drug use: No  . Sexual activity: Not on file  Other Topics Concern  . Not on file  Social History Narrative   divorced, 2 kids, lives by herself, has a boyfriend               Family History  Problem Relation Age of Onset  . Bladder Cancer Father   . Stroke Father   . Colon polyps Father   . Atrial fibrillation Mother   . Heart disease Mother        CHF  . Clotting disorder Mother        warfarin  . Colon polyps Mother   . Colon cancer Maternal Aunt 42  . Colon polyps Sister   . Breast cancer Neg Hx     BP 122/83   Pulse (!) 101   Ht 5\' 3"  (1.6 m)   Wt 156 lb (70.8 kg)   BMI 27.63 kg/m   Review of Systems: See HPI above.     Objective:  Physical Exam:  Gen: NAD, comfortable in exam room.  Right shoulder: No swelling, ecchymoses.  No gross deformity. No TTP. FROM. Negative Hawkins, Neers. Negative Yergasons. Strength 5/5 with empty can and resisted internal/external rotation. Negative apprehension. NV intact distally.  Assessment & Plan:  1. Right shoulder pain - 2/2 rotator cuff impingement.  Much improved with physical therapy and home exercises.  Continue therapy for 2 more weeks then do home exercises for 6 more weeks beyond this.  Tylenol only if needed.  F/u prn.

## 2017-09-24 NOTE — Assessment & Plan Note (Signed)
2/2 rotator cuff impingement.  Much improved with physical therapy and home exercises.  Continue therapy for 2 more weeks then do home exercises for 6 more weeks beyond this.  Tylenol only if needed.  F/u prn.

## 2017-09-24 NOTE — Patient Instructions (Signed)
Continue with therapy likely for 2 more weeks. Do home exercises 3-4 times a week for 6 more weeks beyond this. Call me if you have any problems otherwise follow up as needed.

## 2017-09-25 NOTE — Addendum Note (Signed)
Addended byDamita Dunnings D on: 09/25/2017 03:50 PM   Modules accepted: Orders

## 2017-09-28 NOTE — Progress Notes (Signed)
Did not need to see me today.

## 2017-10-15 ENCOUNTER — Ambulatory Visit (INDEPENDENT_AMBULATORY_CARE_PROVIDER_SITE_OTHER): Payer: BLUE CROSS/BLUE SHIELD | Admitting: Rehabilitative and Restorative Service Providers"

## 2017-10-15 ENCOUNTER — Encounter: Payer: Self-pay | Admitting: Rehabilitative and Restorative Service Providers"

## 2017-10-15 DIAGNOSIS — R293 Abnormal posture: Secondary | ICD-10-CM | POA: Diagnosis not present

## 2017-10-15 DIAGNOSIS — M6281 Muscle weakness (generalized): Secondary | ICD-10-CM

## 2017-10-15 DIAGNOSIS — M25511 Pain in right shoulder: Secondary | ICD-10-CM | POA: Diagnosis not present

## 2017-10-15 NOTE — Therapy (Signed)
[place] [place], [place], [address] Phone: [phone]   Fax:  [phone]  Physical Therapy Treatment  Patient Details  Name: [name] [name] MRN: [id number] Date of Birth: Apr 12, 1951 Referring Provider: Dr Karlton Lemon    Encounter Date: 10/15/2017  PT End of Session - 10/15/17 1450    Activity Tolerance  Patient tolerated treatment well       Past Medical History:  Diagnosis Date  . Anxiety   . Bilateral lower extremity pain   . Cholelithiasis   . Colon polyps    adenomatous  . High cholesterol   . Hx of radiation therapy 10/07/11 to 11/20/11   right lung  . Hx of radiation therapy 01/07/2012- 01/21/12   cranial irradiation  . Hypertension   . Lung cancer ([place]) 09/19/11   Stage III currently in remission  . Malignant melanoma of skin of canthus of right eye ([place]) 02/2017  . Osteoporosis 04/2014   Dexa scan by Dr. Corinna Capra, T-score -2.7, next in 2 years, with at least 13 % major fracture in 10 years  . Pitting edema    Bilateral lower extremities, duplex ultrasound 04/03/2015 normal, no DVT    Past Surgical History:  Procedure Laterality Date  . CHOLECYSTECTOMY  04/2011   biliary stent placement  . [place]  2015  . MOHS SURGERY Right 02/2017   cheek and eyelid  . Josph Macho Touch     Vaginal procedure  . TUBAL LIGATION      There were no vitals filed for this visit.  Subjective Assessment - 10/15/17 1417    Subjective  Patient reports that shoulder has improved since initially beginning PT but she continues to have pain with reaching behind back; opening and closing car doors; carrying or lifting objects; fastening bra or reaching behind her back.     Currently in Pain?  No/denies    Pain Score  -- 5/10 with movement     Pain Location  Shoulder    Pain Orientation  Right    Aggravating Factors   reaching to back seat; reaching back; trying to fasten bra; lifting;  reaching     Pain Relieving Factors  avoidinbg activities that irritate arm          [facility] PT Assessment - 10/15/17 0001      Assessment   Medical Diagnosis  Rt shoulder pain    Referring Provider  Dr Karlton Lemon     Onset Date/Surgical Date  06/27/17    Hand Dominance  Right    Next MD Visit  09/21/17      Posture/Postural Control   Posture Comments  head forward; shoulders rounded and elevated; scapulae abducted and rotated along the thoracic spine; head of the humerus anterior in orientation       AROM   Overall AROM Comments  Shoulder ROM grossly WFL's bilat - discomfort with end ranges Rt - greatest with IR and abduction       Strength   Right Shoulder Flexion  5/5    Right Shoulder Extension  5/5    Right Shoulder ABduction  4+/5 discomfort    Right Shoulder Internal Rotation  -- 5-/5    Right Shoulder External Rotation  -- 5-/5      Palpation   Palpation comment  tender and tight through upper traps; leveator; pecs; teres Rt > Lt  [place] Adult PT Treatment/Exercise - 10/15/17 0001      Shoulder Exercises: Supine   Other Supine Exercises  hands behind head for stretch 1-2 min     Other Supine Exercises  prolonged snow angel stretch 2-3 min UE's at ~ 80 deg       Shoulder Exercises: Standing   Other Standing Exercises  axial extension 5 sec x 10; scap squeeze 10 sec x 10; L's x 10; W's x 10 with swim noodle       Shoulder Exercises: Stretch   Other Shoulder Stretches  doorway stretch - 3 positions x 30 sec; 2 reps each position before moving to next position     Other Shoulder Stretches  biceps stretch at table 30 sec x 2 Rt       Manual Therapy   Manual therapy comments  pt supine     Joint Mobilization  Rt GH joint - circumduction; inferior glides    Soft tissue mobilization  working through the pecs; upper traps; anterior shoulder/deltoid/biceps    Passive ROM  PROM with end range stretch into flexion; abductin; ER; IR; extension               PT Education - 10/15/17 1450    Education provided  Yes    Education Details  HEP     Person(s) Educated  Patient    Methods  Explanation;Demonstration;Tactile cues;Verbal cues;Handout    Comprehension  Verbalized understanding;Returned demonstration;Verbal cues required;Tactile cues required          PT Long Term Goals - 10/15/17 1532      PT LONG TERM GOAL #1   Title  I with advanced HEP (11/26/17)    Period  Weeks    Status  Revised      PT LONG TERM GOAL #2   Title  improve FOTO =/< 35% limited ( 11/26/17)     Time  6    Period  Weeks    Status  Revised      PT LONG TERM GOAL #3   Title  report =/> 75% reduction of Rt shoulder pain with donning her bra ( 11/26/17)     Time  6    Period  Weeks    Status  Revised      PT LONG TERM GOAL #4   Title  demo Rt shoulder and upper back strength =/> 5-/5 without pain ( 11/26/17)     Time  6    Period  Weeks    Status  Revised            Plan - 10/15/17 1527    Clinical Impression Statement  Patient returns with persistent Rt shoulder pain with symptoms increased with reaching behind back; trying to fasten bra; reaching or lifting overhead. She has poor posture and alignment limited thoracic extension; end range limitations Rt > Lt shoulder; pain with IR and extensioin of Rt shoulder; weakness and discomfort with resistive testing Rt UE. Patient will benefit from continued Physical Therapy to address problems identified.     History and Personal Factors relevant to plan of care:  osteoporosis; COPD; h/0 lung cancer and melanoma     Clinical Presentation  Stable    Clinical Decision Making  Low    Rehab Potential  Excellent    PT Frequency  2x / week    PT Duration  6 weeks    PT Treatment/Interventions  Iontophoresis 4mg /ml Dexamethasone;Dry needling;Manual techniques;Moist Heat;Taping;Vasopneumatic Device;Patient/family education;Ultrasound;Cryotherapy;Electrical Stimulation;Passive range of  motion;Therapeutic exercise;Therapeutic activities;Neuromuscular re-education    PT Next Visit Plan  postural correction; stretch pecs and biceps/anterior deltoid; strengthen posterior shoulder girdle musculature; PROM and stretching to address tight Rt shoulder capsule; manual work and modalities as indicated     Oncologist with Plan of Care  Patient       Patient will benefit from skilled therapeutic intervention in order to improve the following deficits and impairments:  Pain, Postural dysfunction, Increased muscle spasms, Decreased strength, Impaired UE functional use, Decreased range of motion, Decreased mobility, Decreased activity tolerance  Visit Diagnosis: Acute pain of right shoulder - Plan: PT plan of care cert/re-cert  Muscle weakness (generalized) - Plan: PT plan of care cert/re-cert  Abnormal posture - Plan: PT plan of care cert/re-cert     Problem List Patient Active Problem List   Diagnosis Date Noted  . Right shoulder pain 07/14/2017  . Malignant melanoma of skin of canthus of right eye ([place]) 03/16/2017  . PCP NOTES >>>>>>>>>>>>>>> 05/22/2015  . Coronary artery calcification 02/14/2015  . Pain of lower leg 02/15/2014  . Hernia, abdominal 04/01/2013  . Depression with anxiety 04/01/2012  . Malignant neoplasm of upper lobe, bronchus or lung 09/30/2011  . Lung cancer ([place]) 09/25/2011  . Small cell lung carcinoma ([place]) 09/25/2011  . Annual physical exam 09/05/2011  . IBS -diarrhea predominant 09/05/2011  . COPD (chronic obstructive pulmonary disease) ([place]) 09/05/2011  . OSTEOPOROSIS 03/01/2010  . TOBACCO ABUSE 11/14/2008  . Dyslipidemia 05/18/2007  . Essential hypertension 05/18/2007    Jemaine Prokop Nilda Simmer PT, MPH  10/15/2017, 3:35 PM  [facility] [place], [place], [address] Phone: [phone]   Fax:  [phone]  Name: [name] [name] MRN: [id number] Date of Birth:  1951-01-20

## 2017-10-15 NOTE — Patient Instructions (Addendum)
Axial Extension (Chin Tuck)    Pull chin in and lengthen back of neck. Hold __5 to 10__ seconds while counting out loud. Repeat __10__ times. Do __several__ sessions per day.  Shoulder Blade Squeeze   Can do this with swim noodle along spine in standing  Rotate shoulders back, then squeeze shoulder blades down and back . Hold 10 sec Repeat __10__ times. Do _several ___ sessions per day.  Upper Back Strength: Lower Trapezius / Rotator Cuff " L's "     Arms in waitress pose, palms up. Press hands back and slide shoulder blades down and back. Hold for __5__ seconds. Repeat _10___ times. 1-2 times per day.    Scapular Retraction: Elbow Flexion (Standing)  "W's"     With elbows bent to 90, pinch shoulder blades together and rotate arms out, keeping elbows bent. No pain! Repeat __10__ times per set. Do __1-2__ sets per session. Do _several ___ sessions per day.  Scapula Adduction With Pectoralis Stretch: Low - Standing   Shoulders at 45 hands even with shoulders, keeping weight through legs, shift weight forward until you feel pull or stretch through the front of your chest. Hold _30__ seconds. Do _3__ times, _2-4__ times per day.   Scapula Adduction With Pectoralis Stretch: Mid-Range - Standing   Shoulders at 90 elbows even with shoulders, keeping weight through legs, shift weight forward until you feel pull or strength through the front of your chest. Hold __30_ seconds. Do _3__ times, __2-4_ times per day.   Scapula Adduction With Pectoralis Stretch: High - Standing   Shoulders at 120 hands up high on the doorway, keeping weight on feet, shift weight forward until you feel pull or stretch through the front of your chest. Hold _30__ seconds. Do _3__ times, _2-3__ times per day.   SUPINE Tips A    Being in the supine position means to be lying on the back. Lying on the back is the position of least compression on the bones and discs of the spine, and helps to  re-align the natural curves of the back. Arms at side in comfortable position. Rest in this position for 3-5 minutes - can bend elbows up to release stretch if needed then straighten arms back out.    [facility] at [facility] [place] [address] [place], [place] [address]  [phone] (office) [phone] (fax)

## 2017-10-20 ENCOUNTER — Ambulatory Visit: Payer: BLUE CROSS/BLUE SHIELD | Admitting: Physical Therapy

## 2017-10-20 DIAGNOSIS — M6281 Muscle weakness (generalized): Secondary | ICD-10-CM | POA: Diagnosis not present

## 2017-10-20 DIAGNOSIS — M25511 Pain in right shoulder: Secondary | ICD-10-CM

## 2017-10-20 DIAGNOSIS — R293 Abnormal posture: Secondary | ICD-10-CM

## 2017-10-20 NOTE — Therapy (Signed)
[place], [place], [address] Phone: [phone]   Fax:  [phone]  Physical Therapy Treatment  Patient Details  Name: [name] [name] MRN: [id number] Date of Birth: 28-Nov-1950 Referring Provider: Dr. Karlton Lemon   Encounter Date: 10/20/2017  PT End of Session - 10/20/17 1147    Visit Number  2    Number of Visits  12    Date for PT Re-Evaluation  11/26/17    PT Start Time  1105    PT Stop Time  1144    PT Time Calculation (min)  39 min    Activity Tolerance  Patient tolerated treatment well    Behavior During Therapy  [facility] for tasks assessed/performed       Past Medical History:  Diagnosis Date  . Anxiety   . Bilateral lower extremity pain   . Cholelithiasis   . Colon polyps    adenomatous  . High cholesterol   . Hx of radiation therapy 10/07/11 to 11/20/11   right lung  . Hx of radiation therapy 01/07/2012- 01/21/12   cranial irradiation  . Hypertension   . Lung cancer ([place]) 09/19/11   Stage III currently in remission  . Malignant melanoma of skin of canthus of right eye ([place]) 02/2017  . Osteoporosis 04/2014   Dexa scan by Dr. Corinna Capra, T-score -2.7, next in 2 years, with at least 13 % major fracture in 10 years  . Pitting edema    Bilateral lower extremities, duplex ultrasound 04/03/2015 normal, no DVT    Past Surgical History:  Procedure Laterality Date  . CHOLECYSTECTOMY  04/2011   biliary stent placement  . [place]  2015  . MOHS SURGERY Right 02/2017   cheek and eyelid  . Josph Macho Touch     Vaginal procedure  . TUBAL LIGATION      There were no vitals filed for this visit.  Subjective Assessment - 10/20/17 1114    Subjective  Pt reports she is doing even better than last week.  She continues to have difficulty reaching behind her back.   Currently in Pain?  No/denies    Pain Score  0-No pain up to 2/10 with  movement    Pain Location  Shoulder     Pain Orientation  Right         [facility] PT Assessment - 10/20/17 0001      Assessment   Medical Diagnosis  Rt shoulder pain    Referring Provider  Dr. Karlton Lemon    Onset Date/Surgical Date  06/27/17    Hand Dominance  Right    Next MD Visit  PRN       [facility] Adult PT Treatment/Exercise - 10/20/17 0001      Shoulder Exercises: Standing   Other Standing Exercises  axial extension 5 sec x 10; scap squeeze 10 sec x 10; L's x 10; W's x 10 with swim noodle       Shoulder Exercises: Stretch   Internal Rotation Stretch  3 reps 20 sec     Other Shoulder Stretches  doorway stretch - 3 positions x 30 sec; 2 reps each position before moving to next position       Manual Therapy   Joint Mobilization  Rt GH joint - circumduction; inferior glides    Soft tissue mobilization  STM through Rt posterior shoulder and pec.  TPR with contract relax to infraspinatus and teres minor.     Passive  ROM  PROM with end range stretch into flexion; abductin; ER      Kinesiotix   Facilitate Muscle   I strip of Pink reg Rock tape applied to Rt posterior shoulder and prox tricep; perpendicular strip placed on top.                   PT Long Term Goals - 10/15/17 1532      PT LONG TERM GOAL #1   Title  I with advanced HEP (11/26/17)    Period  Weeks    Status  Revised      PT LONG TERM GOAL #2   Title  improve FOTO =/< 35% limited ( 11/26/17)     Time  6    Period  Weeks    Status  Revised      PT LONG TERM GOAL #3   Title  report =/> 75% reduction of Rt shoulder pain with donning her bra ( 11/26/17)     Time  6    Period  Weeks    Status  Revised      PT LONG TERM GOAL #4   Title  demo Rt shoulder and upper back strength =/> 5-/5 without pain ( 11/26/17)     Time  6    Period  Weeks    Status  Revised            Plan - 10/20/17 1307    Clinical Impression Statement  Pt reporting reduction in Rt shoulder symptoms since return to exercises.  She tolerated all exercises well, and  demonstrated improved Rt shoulder IR ROM after stretches and manual therapy.  Progressing towards established goals.     Rehab Potential  Excellent    PT Frequency  2x / week    PT Duration  6 weeks    PT Treatment/Interventions  Iontophoresis 4mg /ml Dexamethasone;Dry needling;Manual techniques;Moist Heat;Taping;Vasopneumatic Device;Patient/family education;Ultrasound;Cryotherapy;Electrical Stimulation;Passive range of motion;Therapeutic exercise;Therapeutic activities;Neuromuscular re-education    PT Next Visit Plan  postural correction; stretch pecs and biceps/anterior deltoid; strengthen posterior shoulder girdle musculature; PROM and stretching to address tight Rt shoulder capsule; manual work and modalities as indicated     Oncologist with Plan of Care  Patient       Patient will benefit from skilled therapeutic intervention in order to improve the following deficits and impairments:  Pain, Postural dysfunction, Increased muscle spasms, Decreased strength, Impaired UE functional use, Decreased range of motion, Decreased mobility, Decreased activity tolerance  Visit Diagnosis: Acute pain of right shoulder  Muscle weakness (generalized)  Abnormal posture     Problem List Patient Active Problem List   Diagnosis Date Noted  . Right shoulder pain 07/14/2017  . Malignant melanoma of skin of canthus of right eye ([place]) 03/16/2017  . PCP NOTES >>>>>>>>>>>>>>> 05/22/2015  . Coronary artery calcification 02/14/2015  . Pain of lower leg 02/15/2014  . Hernia, abdominal 04/01/2013  . Depression with anxiety 04/01/2012  . Malignant neoplasm of upper lobe, bronchus or lung 09/30/2011  . Lung cancer ([place]) 09/25/2011  . Small cell lung carcinoma ([place]) 09/25/2011  . Annual physical exam 09/05/2011  . IBS -diarrhea predominant 09/05/2011  . COPD (chronic obstructive pulmonary disease) ([place]) 09/05/2011  . OSTEOPOROSIS 03/01/2010  . TOBACCO ABUSE 11/14/2008  . Dyslipidemia 05/18/2007   . Essential hypertension 05/18/2007   Kerin Perna, PTA 10/20/17 1:10 PM  [facility] [place], [place], [address] Phone: [phone]   Fax:  [phone]  Name: [name] [name] MRN: [id number] Date of Birth: 07/21/1951

## 2017-10-22 ENCOUNTER — Ambulatory Visit: Payer: BLUE CROSS/BLUE SHIELD | Admitting: Physical Therapy

## 2017-10-22 DIAGNOSIS — R293 Abnormal posture: Secondary | ICD-10-CM | POA: Diagnosis not present

## 2017-10-22 DIAGNOSIS — M25511 Pain in right shoulder: Secondary | ICD-10-CM

## 2017-10-22 DIAGNOSIS — M6281 Muscle weakness (generalized): Secondary | ICD-10-CM

## 2017-10-22 NOTE — Therapy (Signed)
[facility] [place], [place], [address] Phone: [phone]   Fax:  [phone]  Physical Therapy Treatment  Patient Details  Name: [name] [name] MRN: [id number] Date of Birth: 1951/06/10 Referring Provider: Dr. Karlton Lemon.    Encounter Date: 10/22/2017  PT End of Session - 10/22/17 1410    Visit Number  3    Number of Visits  12    Date for PT Re-Evaluation  11/26/17    PT Start Time  4481    PT Stop Time  8563 MHP last 12 min     PT Time Calculation (min)  53 min       Past Medical History:  Diagnosis Date  . Anxiety   . Bilateral lower extremity pain   . Cholelithiasis   . Colon polyps    adenomatous  . High cholesterol   . Hx of radiation therapy 10/07/11 to 11/20/11   right lung  . Hx of radiation therapy 01/07/2012- 01/21/12   cranial irradiation  . Hypertension   . Lung cancer ([place]) 09/19/11   Stage III currently in remission  . Malignant melanoma of skin of canthus of right eye ([place]) 02/2017  . Osteoporosis 04/2014   Dexa scan by Dr. Corinna Capra, T-score -2.7, next in 2 years, with at least 13 % major fracture in 10 years  . Pitting edema    Bilateral lower extremities, duplex ultrasound 04/03/2015 normal, no DVT    Past Surgical History:  Procedure Laterality Date  . CHOLECYSTECTOMY  04/2011   biliary stent placement  . [place]  2015  . MOHS SURGERY Right 02/2017   cheek and eyelid  . Josph Macho Touch     Vaginal procedure  . TUBAL LIGATION      There were no vitals filed for this visit.  Subjective Assessment - 10/22/17 1412    Subjective  Pt reports her Rt shoulder hurts with putting seat belt on. Otherwise her shoulder is moving much better each day.     Patient Stated Goals  settle the bursitis and impingement in her shoulder and make it better.  Doesn't want to have surgery.     Currently in Pain?  No/denies    Pain Score  0-No pain up to 1/49 with certain  motions    Pain Location  Shoulder    Pain Orientation  Right         [facility] PT Assessment - 10/22/17 0001      Assessment   Medical Diagnosis  Rt shoulder pain    Referring Provider  Dr. Karlton Lemon.     Onset Date/Surgical Date  06/27/17    Hand Dominance  Right    Next MD Visit  PRN        [facility] Adult PT Treatment/Exercise - 10/22/17 0001      Shoulder Exercises: Supine   External Rotation  Strengthening;Both;10 reps;Theraband    Theraband Level (Shoulder External Rotation)  Level 2 (Red)    Other Supine Exercises  hands behind head for stretch 1 min;  Sash RUE x 20 reps with red band, x 10 with green band.     Other Supine Exercises  prolonged snow angel stretch 2-3 min UE's at ~ 80 deg       Shoulder Exercises: Standing   Other Standing Exercises  D2 ext with red band x 10 reps each arm;  D2 flexion with RUE x 3 reps - increased pain when arm at  90 deg abdct - switched to supine      Shoulder Exercises: Pulleys   Flexion  2 minutes      Shoulder Exercises: ROM/Strengthening   UBE (Upper Arm Bike)  L1: 1.5 min each direction seated.       Shoulder Exercises: Stretch   Other Shoulder Stretches  doorway stretch - 3 positions x 30 sec; 2 reps each position before moving to next position       Modalities   Modalities  Moist Heat      Moist Heat Therapy   Number Minutes Moist Heat  12 Minutes    Moist Heat Location  Shoulder      Manual Therapy   Soft tissue mobilization  STM through Rt posterior shoulder and pec.  TPR with contract relax to infraspinatus, subscap,  and teres minor.     Passive ROM  PROM with end range stretch into ER      Kinesiotix   Facilitate Muscle   I strip of Pink reg Rock tape applied to Rt posterior shoulder at teres minor and perpendicular strip over it.                   PT Long Term Goals - 10/15/17 1532      PT LONG TERM GOAL #1   Title  I with advanced HEP (11/26/17)    Period  Weeks    Status  Revised      PT LONG  TERM GOAL #2   Title  improve FOTO =/< 35% limited ( 11/26/17)     Time  6    Period  Weeks    Status  Revised      PT LONG TERM GOAL #3   Title  report =/> 75% reduction of Rt shoulder pain with donning her bra ( 11/26/17)     Time  6    Period  Weeks    Status  Revised      PT LONG TERM GOAL #4   Title  demo Rt shoulder and upper back strength =/> 5-/5 without pain ( 11/26/17)     Time  6    Period  Weeks    Status  Revised            Plan - 10/22/17 1416    Clinical Impression Statement  Pt continues to have pain in Rt lateral shoulder with extension and internal rotation.  She tolerated all exercises well, except D2 flexion in standing - this caused increased pain in Rt lateral shoulder with 90 deg abdct.  Pt progressing well towards therapy goals.     Rehab Potential  Excellent    PT Frequency  2x / week    PT Duration  6 weeks    PT Treatment/Interventions  Iontophoresis 4mg /ml Dexamethasone;Dry needling;Manual techniques;Moist Heat;Taping;Vasopneumatic Device;Patient/family education;Ultrasound;Cryotherapy;Electrical Stimulation;Passive range of motion;Therapeutic exercise;Therapeutic activities;Neuromuscular re-education    PT Next Visit Plan  continue manual therapy and stretches to Rt shoulder; strengthen post shoulder girdle.     Consulted and Agree with Plan of Care  Patient       Patient will benefit from skilled therapeutic intervention in order to improve the following deficits and impairments:  Pain, Postural dysfunction, Increased muscle spasms, Decreased strength, Impaired UE functional use, Decreased range of motion, Decreased mobility, Decreased activity tolerance  Visit Diagnosis: Acute pain of right shoulder  Muscle weakness (generalized)  Abnormal posture     Problem List Patient Active Problem List   Diagnosis Date Noted  .  Right shoulder pain 07/14/2017  . Malignant melanoma of skin of canthus of right eye ([place]) 03/16/2017  . PCP NOTES  >>>>>>>>>>>>>>> 05/22/2015  . Coronary artery calcification 02/14/2015  . Pain of lower leg 02/15/2014  . Hernia, abdominal 04/01/2013  . Depression with anxiety 04/01/2012  . Malignant neoplasm of upper lobe, bronchus or lung 09/30/2011  . Lung cancer ([place]) 09/25/2011  . Small cell lung carcinoma ([place]) 09/25/2011  . Annual physical exam 09/05/2011  . IBS -diarrhea predominant 09/05/2011  . COPD (chronic obstructive pulmonary disease) ([place]) 09/05/2011  . OSTEOPOROSIS 03/01/2010  . TOBACCO ABUSE 11/14/2008  . Dyslipidemia 05/18/2007  . Essential hypertension 05/18/2007   Kerin Perna, PTA 10/22/17 6:20 PM  [facility] [place], [place], [address] Phone: [phone]   Fax:  [phone]  Name: [name] [name] MRN: [id number] Date of Birth: [birth year]

## 2017-10-27 ENCOUNTER — Encounter: Payer: BLUE CROSS/BLUE SHIELD | Admitting: Physical Therapy

## 2017-10-30 ENCOUNTER — Ambulatory Visit: Payer: BLUE CROSS/BLUE SHIELD | Admitting: Physical Therapy

## 2017-10-30 DIAGNOSIS — M6281 Muscle weakness (generalized): Secondary | ICD-10-CM

## 2017-10-30 DIAGNOSIS — R293 Abnormal posture: Secondary | ICD-10-CM | POA: Diagnosis not present

## 2017-10-30 DIAGNOSIS — M25511 Pain in right shoulder: Secondary | ICD-10-CM

## 2017-10-30 NOTE — Therapy (Signed)
[place], [place], [address] Phone: [phone]   Fax:  [phone]  Physical Therapy Treatment  Patient Details  Name: [name] [name] MRN: [id number] Date of Birth: [birth year] Referring Provider: Dr. Karlton Lemon   Encounter Date: 10/30/2017  PT End of Session - 10/30/17 1409    Visit Number  4    Number of Visits  12    Date for PT Re-Evaluation  11/26/17    PT Start Time  1402    PT Stop Time  1451    PT Time Calculation (min)  49 min       Past Medical History:  Diagnosis Date  . Anxiety   . Bilateral lower extremity pain   . Cholelithiasis   . Colon polyps    adenomatous  . High cholesterol   . Hx of radiation therapy 10/07/11 to 11/20/11   right lung  . Hx of radiation therapy 01/07/2012- 01/21/12   cranial irradiation  . Hypertension   . Lung cancer ([place]) 09/19/11   Stage III currently in remission  . Malignant melanoma of skin of canthus of right eye ([place]) 02/2017  . Osteoporosis 04/2014   Dexa scan by Dr. Corinna Capra, T-score -2.7, next in 2 years, with at least 13 % major fracture in 10 years  . Pitting edema    Bilateral lower extremities, duplex ultrasound 04/03/2015 normal, no DVT    Past Surgical History:  Procedure Laterality Date  . CHOLECYSTECTOMY  04/2011   biliary stent placement  . [place]  2015  . MOHS SURGERY Right 02/2017   cheek and eyelid  . Josph Macho Touch     Vaginal procedure  . TUBAL LIGATION      There were no vitals filed for this visit.  Subjective Assessment - 10/30/17 1409    Subjective  Pt reports she is "doing remarkable".  She is having less pain with reaching in back seat and is not having pain with putting seat belt on.     Patient Stated Goals  settle the bursitis and impingement in her shoulder and make it better.  Doesn't want to have surgery.     Currently in Pain?  No/denies    Pain Score  0-No pain up to 2/35 with  certain motions    Pain Location  Shoulder    Pain Orientation  Right         [facility] PT Assessment - 10/30/17 0001      Assessment   Medical Diagnosis  Rt shoulder pain    Referring Provider  Dr. Karlton Lemon    Onset Date/Surgical Date  06/27/17    Hand Dominance  Right    Next MD Visit  PRN      Strength   Right Shoulder Flexion  -- 5-/5    Right Shoulder Extension  5/5    Right Shoulder ABduction  -- 5-5    Right Shoulder Internal Rotation  -- 5-/5, discomfort    Right Shoulder External Rotation  -- 5-/5, discomfort        [facility] Adult PT Treatment/Exercise - 10/30/17 0001      Shoulder Exercises: Standing   External Rotation  Both;10 reps;Theraband 2 sets    Internal Rotation  Right;12 reps;Theraband    Row  Both;Theraband;15 reps    Theraband Level (Shoulder Row)  Level 2 (Red)    Other Standing Exercises  D2 flex with yellow x 5 reps; D2 ext x  15 with red band . horiz abd with yellow increased pain after 5 reps; stopped.     Other Standing Exercises  reviewed exercises pt reported trainer performs with her and wrote modifications for trainer to consider.       Shoulder Exercises: ROM/Strengthening   UBE (Upper Arm Bike)  L1: 1.5 min each direction seated.       Shoulder Exercises: Stretch   Cross Chest Stretch  2 reps;20 seconds RUE    Other Shoulder Stretches  doorway stretch - 3 positions x 30 sec; 2 reps each position before moving to next position       Modalities   Modalities  -- pt declined      Manual Therapy   Soft tissue mobilization  STM to Rt tricep and posterior shoulder.  Edge tool assistance to Rt infraspinatus, teres majr, prox tricep to decrease fascial tightness and pain.       Kinesiotix   Facilitate Muscle   I strip of Pink reg Rock tape applied to Rt posterior shoulder at teres minor and perpendicular strip over it.            PT Long Term Goals - 10/30/17 1414      PT LONG TERM GOAL #1   Title  I with advanced HEP (11/26/17)    Time   6    Period  Weeks    Status  Achieved      PT LONG TERM GOAL #2   Title  improve FOTO =/< 35% limited ( 11/26/17)     Time  6    Period  Weeks      PT LONG TERM GOAL #3   Title  report =/> 75% reduction of Rt shoulder pain with donning her bra ( 11/26/17)     Time  6    Period  Weeks    Status  Partially Met pt reports she puts on bra in front; has changed mechanics from behind back.       PT LONG TERM GOAL #4   Title  demo Rt shoulder and upper back strength =/> 5-/5 without pain ( 11/26/17)     Time  6    Period  Weeks            Plan - 10/30/17 1454    Clinical Impression Statement  Pt's strength of Rt shoulder is improving, however it is still painful with ER/IR.  Pt continues to be point tender in Rt infraspinatus/teres musculature; reduced with manual therapy and Rock tape. Pt progressing towards goals.     Rehab Potential  Excellent    PT Frequency  2x / week    PT Duration  6 weeks    PT Treatment/Interventions  Iontophoresis '4mg'$ /ml Dexamethasone;Dry needling;Manual techniques;Moist Heat;Taping;Vasopneumatic Device;Patient/family education;Ultrasound;Cryotherapy;Electrical Stimulation;Passive range of motion;Therapeutic exercise;Therapeutic activities;Neuromuscular re-education    PT Next Visit Plan  continue manual therapy and stretches to Rt shoulder; strengthen post shoulder girdle.        Patient will benefit from skilled therapeutic intervention in order to improve the following deficits and impairments:  Pain, Postural dysfunction, Increased muscle spasms, Decreased strength, Impaired UE functional use, Decreased range of motion, Decreased mobility, Decreased activity tolerance  Visit Diagnosis: Acute pain of right shoulder  Muscle weakness (generalized)  Abnormal posture     Problem List Patient Active Problem List   Diagnosis Date Noted  . Right shoulder pain 07/14/2017  . Malignant melanoma of skin of canthus of right eye ([place]) 03/16/2017  .  PCP  NOTES >>>>>>>>>>>>>>> 05/22/2015  . Coronary artery calcification 02/14/2015  . Pain of lower leg 02/15/2014  . Hernia, abdominal 04/01/2013  . Depression with anxiety 04/01/2012  . Malignant neoplasm of upper lobe, bronchus or lung 09/30/2011  . Lung cancer ([place]) 09/25/2011  . Small cell lung carcinoma ([place]) 09/25/2011  . Annual physical exam 09/05/2011  . IBS -diarrhea predominant 09/05/2011  . COPD (chronic obstructive pulmonary disease) ([place]) 09/05/2011  . OSTEOPOROSIS 03/01/2010  . TOBACCO ABUSE 11/14/2008  . Dyslipidemia 05/18/2007  . Essential hypertension 05/18/2007   Kerin Perna, PTA 10/30/17 3:04 PM  [place], [place], [address] Phone: [phone]   Fax:  [phone]  Name: [name] [name] MRN: [id number] Date of Birth: 01-02-1951

## 2017-10-30 NOTE — Patient Instructions (Signed)
Resisted External Rotation: in Neutral - Bilateral  PALMS UP!!! Sit or stand, tubing in both hands, elbows at sides, bent to 90, forearms forward. Pinch shoulder blades together and rotate forearms out. Keep elbows at sides. Repeat __10__ times per set. Do __2-3__ sets per session. Do __3-4__ sessions per week.

## 2017-11-05 ENCOUNTER — Ambulatory Visit: Payer: BLUE CROSS/BLUE SHIELD | Admitting: Physical Therapy

## 2017-11-05 DIAGNOSIS — M25511 Pain in right shoulder: Secondary | ICD-10-CM | POA: Diagnosis not present

## 2017-11-05 DIAGNOSIS — R293 Abnormal posture: Secondary | ICD-10-CM | POA: Diagnosis not present

## 2017-11-05 DIAGNOSIS — M6281 Muscle weakness (generalized): Secondary | ICD-10-CM | POA: Diagnosis not present

## 2017-11-05 NOTE — Therapy (Signed)
[place], [place], [address] Phone: [phone]   Fax:  [phone]  Physical Therapy Treatment  Patient Details  Name: [name] [name] MRN: [id number] Date of Birth: 1951/08/10 Referring Provider: Dr. Karlton Lemon   Encounter Date: 11/05/2017  PT End of Session - 11/05/17 1415    Visit Number  5    Number of Visits  12    Date for PT Re-Evaluation  11/26/17    PT Start Time  5027    PT Stop Time  7412    PT Time Calculation (min)  45 min       Past Medical History:  Diagnosis Date  . Anxiety   . Bilateral lower extremity pain   . Cholelithiasis   . Colon polyps    adenomatous  . High cholesterol   . Hx of radiation therapy 10/07/11 to 11/20/11   right lung  . Hx of radiation therapy 01/07/2012- 01/21/12   cranial irradiation  . Hypertension   . Lung cancer ([place]) 09/19/11   Stage III currently in remission  . Malignant melanoma of skin of canthus of right eye ([place]) 02/2017  . Osteoporosis 04/2014   Dexa scan by Dr. Corinna Capra, T-score -2.7, next in 2 years, with at least 13 % major fracture in 10 years  . Pitting edema    Bilateral lower extremities, duplex ultrasound 04/03/2015 normal, no DVT    Past Surgical History:  Procedure Laterality Date  . CHOLECYSTECTOMY  04/2011   biliary stent placement  . [facility]  2015  . MOHS SURGERY Right 02/2017   cheek and eyelid  . Josph Macho Touch     Vaginal procedure  . TUBAL LIGATION      There were no vitals filed for this visit.  Subjective Assessment - 11/05/17 1514    Subjective  [name] [name] reports she is no longer doing high rows with her trainer.  She still has pain when she reaches in her back seat of car and across her chest, but the pain is not as bad as it was when she first started therapy.     Patient Stated Goals  settle the bursitis and impingement in her shoulder and make it better.  Doesn't want to have surgery.      Currently in Pain?  No/denies    Pain Score  0-No pain up to 8-7/86 with certain motions.          [facility] PT Assessment - 11/05/17 0001      Strength   Right Shoulder Internal Rotation  -- 5-/5 with pain    Right Shoulder External Rotation  4+/5         [facility] Adult PT Treatment/Exercise - 11/05/17 0001      Shoulder Exercises: Supine   Other Supine Exercises  hands behind head for stretch 20 sec;  Sash RUE x 10 reps with Green band, x 2sets      Shoulder Exercises: Standing   External Rotation  Both;10 reps;Theraband 2 sets    Theraband Level (Shoulder External Rotation)  Level 2 (Red)    Row  Strengthening;Both;Theraband;12 reps 3 sec pause     Other Standing Exercises  reverse wall push up - 3 reps, increased pain - switched to resisted row      Shoulder Exercises: ROM/Strengthening   UBE (Upper Arm Bike)  L1: 1.5 min each direction seated.       Shoulder Exercises: Stretch   Other Shoulder  Stretches  doorway stretch - 3 positions x 30 sec; 2 reps each position before moving to next position     Other Shoulder Stretches  biceps stretch at table 30 sec x 2 Rt       Modalities   Modalities  -- pt declined      Manual Therapy   Soft tissue mobilization  STM through Rt posterior shoulder and pec.  TPR with contract relax to infraspinatus, subscap,  and teres minor.     Passive ROM  PROM with end range stretch into ER      Kinesiotix   Facilitate Muscle   I strip of [place] tape applied to Rt posterior shoulder at teres minor and perpendicular strip over it.                   PT Long Term Goals - 11/05/17 1512      PT LONG TERM GOAL #1   Title  I with advanced HEP (11/26/17)    Time  6    Period  Weeks    Status  On-going      PT LONG TERM GOAL #2   Title  improve FOTO =/< 35% limited ( 11/26/17)     Time  6    Period  Weeks    Status  On-going      PT LONG TERM GOAL #3   Title  report =/> 75% reduction of Rt shoulder pain with donning her bra  ( 11/26/17)     Time  6    Period  Weeks    Status  Partially Met      PT LONG TERM GOAL #4   Title  demo Rt shoulder and upper back strength =/> 5-/5 without pain ( 11/26/17)     Time  6    Period  Weeks    Status  Partially Met            Plan - 11/05/17 1507    Clinical Impression Statement  Pt continues to report discomfort with Rt shoulder horiz add, IR, and horiz abd/extension motions. She had minimal discomfort with Rt shoulder ER MMT, however IR is still painful.  She is point tender in Rt subscapularis and infraspinatus.  She was unable to tolerate reverse wall push ups, but tolerated all other exercises.  Pt reported decrease in symptoms with ROM of Rt shoulder at end of session. Pt making gradual progress towards remaining goals.     Rehab Potential  Excellent    PT Frequency  2x / week    PT Duration  6 weeks    PT Treatment/Interventions  Iontophoresis '4mg'$ /ml Dexamethasone;Dry needling;Manual techniques;Moist Heat;Taping;Vasopneumatic Device;Patient/family education;Ultrasound;Cryotherapy;Electrical Stimulation;Passive range of motion;Therapeutic exercise;Therapeutic activities;Neuromuscular re-education    PT Next Visit Plan  continue manual therapy and stretches to Rt shoulder; strengthen post shoulder girdle.     Consulted and Agree with Plan of Care  Patient       Patient will benefit from skilled therapeutic intervention in order to improve the following deficits and impairments:  Pain, Postural dysfunction, Increased muscle spasms, Decreased strength, Impaired UE functional use, Decreased range of motion, Decreased mobility, Decreased activity tolerance  Visit Diagnosis: Acute pain of right shoulder  Muscle weakness (generalized)  Abnormal posture     Problem List Patient Active Problem List   Diagnosis Date Noted  . Right shoulder pain 07/14/2017  . Malignant melanoma of skin of canthus of right eye ([place]) 03/16/2017  . PCP NOTES >>>>>>>>>>>>>>>  05/22/2015   . Coronary artery calcification 02/14/2015  . Pain of lower leg 02/15/2014  . Hernia, abdominal 04/01/2013  . Depression with anxiety 04/01/2012  . Malignant neoplasm of upper lobe, bronchus or lung 09/30/2011  . Lung cancer ([place]) 09/25/2011  . Small cell lung carcinoma ([place]) 09/25/2011  . Annual physical exam 09/05/2011  . IBS -diarrhea predominant 09/05/2011  . COPD (chronic obstructive pulmonary disease) ([place]) 09/05/2011  . OSTEOPOROSIS 03/01/2010  . TOBACCO ABUSE 11/14/2008  . Dyslipidemia 05/18/2007  . Essential hypertension 05/18/2007   Kerin Perna, PTA 11/05/17 3:17 PM  [place], [place], [address] Phone: [phone]   Fax:  [phone]  Name: [name] [name] MRN: [id number] Date of Birth: Jan 18, 1951

## 2017-11-06 ENCOUNTER — Encounter: Payer: Self-pay | Admitting: Internal Medicine

## 2017-11-13 ENCOUNTER — Ambulatory Visit: Payer: BLUE CROSS/BLUE SHIELD | Admitting: Physical Therapy

## 2017-11-13 DIAGNOSIS — R293 Abnormal posture: Secondary | ICD-10-CM | POA: Diagnosis not present

## 2017-11-13 DIAGNOSIS — M6281 Muscle weakness (generalized): Secondary | ICD-10-CM

## 2017-11-13 DIAGNOSIS — M25511 Pain in right shoulder: Secondary | ICD-10-CM

## 2017-11-13 NOTE — Therapy (Signed)
[place], [place], [address] Phone: [phone]   Fax:  [phone]  Physical Therapy Treatment  Patient Details  Name: [name] [name] MRN: [id number] Date of Birth: 11-20-1950 Referring Provider: Dr. Karlton Lemon   Encounter Date: 11/13/2017  PT End of Session - 11/13/17 1415    Visit Number  6    Number of Visits  12    Date for PT Re-Evaluation  11/26/17    PT Start Time  9357    PT Stop Time  0177    PT Time Calculation (min)  43 min       Past Medical History:  Diagnosis Date  . Anxiety   . Bilateral lower extremity pain   . Cholelithiasis   . Colon polyps    adenomatous  . High cholesterol   . Hx of radiation therapy 10/07/11 to 11/20/11   right lung  . Hx of radiation therapy 01/07/2012- 01/21/12   cranial irradiation  . Hypertension   . Lung cancer ([place]) 09/19/11   Stage III currently in remission  . Malignant melanoma of skin of canthus of right eye ([place]) 02/2017  . Osteoporosis 04/2014   Dexa scan by Dr. Corinna Capra, T-score -2.7, next in 2 years, with at least 13 % major fracture in 10 years  . Pitting edema    Bilateral lower extremities, duplex ultrasound 04/03/2015 normal, no DVT    Past Surgical History:  Procedure Laterality Date  . CHOLECYSTECTOMY  04/2011   biliary stent placement  . [place]  2015  . MOHS SURGERY Right 02/2017   cheek and eyelid  . Josph Macho Touch     Vaginal procedure  . TUBAL LIGATION      There were no vitals filed for this visit.  Subjective Assessment - 11/13/17 1416    Subjective  Pt reports each week her shoulder is getting better and better. She still has pain with reaching behind back and putting coat on. No longer has pain with reaching in back seat.     Currently in Pain?  No/denies    Pain Score  0-No pain up to 9/39 with certain          [facility] PT Assessment - 11/13/17 0001      Assessment   Medical Diagnosis   Rt shoulder pain    Referring Provider  Dr. Karlton Lemon    Onset Date/Surgical Date  06/27/17    Hand Dominance  Right    Next MD Visit  PRN      Strength   Right Shoulder ABduction  -- 5-/5, with pain    Right Shoulder Internal Rotation  -- 5-/5 with soreness    Right Shoulder External Rotation  -- 5-/5       [facility] Adult PT Treatment/Exercise - 11/13/17 0001      Shoulder Exercises: Supine   Horizontal ABduction  Strengthening;Both;10 reps;Theraband    Other Supine Exercises  hands behind head for stretch 30 sec.      Other Supine Exercises  prolonged snow angel stretch 2-3 min UE's at ~ 80 deg       Shoulder Exercises: Standing   Row  Strengthening;Both;Theraband;12 reps 3 sec pause     Theraband Level (Shoulder Row)  Level 3 (Green)    Other Standing Exercises  reverse wall push up -x 10 reps, no pain      Shoulder Exercises: ROM/Strengthening   UBE (Upper Arm Bike)  L1:  1.5 min each direction seated.       Shoulder Exercises: Stretch   Other Shoulder Stretches  doorway stretch - 3 positions x 30 sec; 2 reps each position before moving to next position       Manual Therapy   Soft tissue mobilization  STM through Rt posterior shoulder and pec.  TPR with contract relax to infraspinatus, subscap,  and teres minor.     Myofascial Release  Rt pec release  pt tender along sternum and pec minor      Kinesiotix   Facilitate Muscle   -- tape held, scab in area of treatment.                   PT Long Term Goals - 11/13/17 1456      PT LONG TERM GOAL #1   Title  I with advanced HEP (11/26/17)    Time  6    Period  Weeks    Status  On-going      PT LONG TERM GOAL #2   Title  improve FOTO =/< 35% limited ( 11/26/17)     Time  6    Period  Weeks    Status  On-going      PT LONG TERM GOAL #3   Title  report =/> 75% reduction of Rt shoulder pain with donning her bra ( 11/26/17)     Time  6    Period  Weeks    Status  Partially Met able to reach behind back, but  painful      PT LONG TERM GOAL #4   Title  demo Rt shoulder and upper back strength =/> 5-/5 without pain ( 11/26/17)     Time  6    Period  Weeks    Status  Achieved            Plan - 11/13/17 1457    Clinical Impression Statement  Pt demonstrated improved Rt shoulder strength, with less discomfort; has met LTG #4.  She had less palpable tightness with manual therapy.  She was able to tolerate standing sash and reverse wall push ups without pain. Pt making good progress towards remaining goals.      Rehab Potential  Excellent    PT Frequency  2x / week    PT Duration  6 weeks    PT Treatment/Interventions  Iontophoresis '4mg'$ /ml Dexamethasone;Dry needling;Manual techniques;Moist Heat;Taping;Vasopneumatic Device;Patient/family education;Ultrasound;Cryotherapy;Electrical Stimulation;Passive range of motion;Therapeutic exercise;Therapeutic activities;Neuromuscular re-education    PT Next Visit Plan  assess readiness to d/c to HEP.  FOTO   Consulted and Agree with Plan of Care  Patient       Patient will benefit from skilled therapeutic intervention in order to improve the following deficits and impairments:  Pain, Postural dysfunction, Increased muscle spasms, Decreased strength, Impaired UE functional use, Decreased range of motion, Decreased mobility, Decreased activity tolerance  Visit Diagnosis: Acute pain of right shoulder  Muscle weakness (generalized)  Abnormal posture     Problem List Patient Active Problem List   Diagnosis Date Noted  . Right shoulder pain 07/14/2017  . Malignant melanoma of skin of canthus of right eye ([place]) 03/16/2017  . PCP NOTES >>>>>>>>>>>>>>> 05/22/2015  . Coronary artery calcification 02/14/2015  . Pain of lower leg 02/15/2014  . Hernia, abdominal 04/01/2013  . Depression with anxiety 04/01/2012  . Malignant neoplasm of upper lobe, bronchus or lung 09/30/2011  . Lung cancer ([place]) 09/25/2011  . Small cell lung carcinoma ([place]) 09/25/2011  .  Annual physical exam 09/05/2011  . IBS -diarrhea predominant 09/05/2011  . COPD (chronic obstructive pulmonary disease) ([place]) 09/05/2011  . OSTEOPOROSIS 03/01/2010  . TOBACCO ABUSE 11/14/2008  . Dyslipidemia 05/18/2007  . Essential hypertension 05/18/2007   Kerin Perna, PTA 11/13/17 3:06 PM  [place], [place], [address] Phone: [phone]   Fax:  [phone]  Name: [name] [name] MRN: [id number] Date of Birth: 1951/01/31

## 2017-11-16 ENCOUNTER — Telehealth: Payer: Self-pay

## 2017-11-17 ENCOUNTER — Ambulatory Visit: Payer: BLUE CROSS/BLUE SHIELD | Admitting: Cardiovascular Disease

## 2017-11-19 ENCOUNTER — Ambulatory Visit: Payer: BLUE CROSS/BLUE SHIELD | Admitting: Physical Therapy

## 2017-11-19 ENCOUNTER — Encounter: Payer: Self-pay | Admitting: Physical Therapy

## 2017-11-19 DIAGNOSIS — M6281 Muscle weakness (generalized): Secondary | ICD-10-CM

## 2017-11-19 DIAGNOSIS — R293 Abnormal posture: Secondary | ICD-10-CM | POA: Diagnosis not present

## 2017-11-19 DIAGNOSIS — M25511 Pain in right shoulder: Secondary | ICD-10-CM

## 2017-11-19 NOTE — Therapy (Addendum)
[place], [place], [address] Phone: [phone]   Fax:  [phone]  Physical Therapy Treatment  Patient Details  Name: [name] [name] MRN: [id number] Date of Birth: 05/22/1951 Referring Provider: Dr. Karlton Lemon   Encounter Date: 11/19/2017  PT End of Session - 11/19/17 1416    Visit Number  7    Number of Visits  12    Date for PT Re-Evaluation  11/26/17    PT Start Time  2585 pt arrived late    PT Stop Time  1450    PT Time Calculation (min)  38 min    Activity Tolerance  Patient tolerated treatment well;No increased pain    Behavior During Therapy  WFL for tasks assessed/performed       Past Medical History:  Diagnosis Date  . Anxiety   . Bilateral lower extremity pain   . Cholelithiasis   . Colon polyps    adenomatous  . High cholesterol   . Hx of radiation therapy 10/07/11 to 11/20/11   right lung  . Hx of radiation therapy 01/07/2012- 01/21/12   cranial irradiation  . Hypertension   . Lung cancer ([place]) 09/19/11   Stage III currently in remission  . Malignant melanoma of skin of canthus of right eye ([place]) 02/2017  . Osteoporosis 04/2014   Dexa scan by Dr. Corinna Capra, T-score -2.7, next in 2 years, with at least 13 % major fracture in 10 years  . Pitting edema    Bilateral lower extremities, duplex ultrasound 04/03/2015 normal, no DVT    Past Surgical History:  Procedure Laterality Date  . CHOLECYSTECTOMY  04/2011   biliary stent placement  . [place]  2015  . MOHS SURGERY Right 02/2017   cheek and eyelid  . Josph Macho Touch     Vaginal procedure  . TUBAL LIGATION      There were no vitals filed for this visit.  Subjective Assessment - 11/19/17 1420    Subjective  Pt reports her shoulder is doing "fabulous".   She continues you to have minor pain in shoulder when reaching behind back or across chest, "but I can live with it".      Patient Stated Goals   settle the bursitis and impingement in her shoulder and make it better.  Doesn't want to have surgery.     Currently in Pain?  No/denies    Pain Score  0-No pain         [facility] PT Assessment - 11/19/17 0001      Assessment   Medical Diagnosis  Rt shoulder pain    Referring Provider  Dr. Karlton Lemon    Onset Date/Surgical Date  06/27/17    Hand Dominance  Right    Next MD Visit  PRN      Observation/Other Assessments   Focus on Therapeutic Outcomes (FOTO)   19% limited      Strength   Right Shoulder Extension  5/5    Right Shoulder ABduction  -- 5-/5    Right Shoulder Internal Rotation  -- 5-/5    Right Shoulder External Rotation  -- 5-/5       [facility] Adult PT Treatment/Exercise - 11/19/17 0001      Shoulder Exercises: Standing   External Rotation  Strengthening;Right;10 reps;Theraband    Theraband Level (Shoulder External Rotation)  Level 2 (Red)    Internal Rotation  Strengthening;Right;10 reps;Theraband    Theraband Level (Shoulder Internal Rotation)  Level 2 (Red)    Row  Strengthening;Both;Theraband;12 reps 3 sec pause     Theraband Level (Shoulder Row)  Level 2 (Red)    Other Standing Exercises  reverse wall push up -x 10 reps, no pain    Other Standing Exercises  sash with yellow band x 10      Shoulder Exercises: ROM/Strengthening   Other ROM/Strengthening Exercises  NuStep arms only L4: 5 min       Shoulder Exercises: Stretch   Other Shoulder Stretches  doorway stretch - 3 positions x 30 sec; 2 reps each position before moving to next position     Other Shoulder Stretches  biceps stretch at table 30 sec x 2 Rt       Moist Heat Therapy   Number Minutes Moist Heat  -- During manual therapy    Moist Heat Location  Shoulder      Manual Therapy   Soft tissue mobilization  STM through Rt posterior shoulder and pec.  TPR with contract relax to infraspinatus, subscap,  and teres minor.  minimal tenderness.     Myofascial Release  Rt pec release                    PT Long Term Goals - 11/19/17 1744      PT LONG TERM GOAL #1   Title  I with advanced HEP (11/26/17)    Time  6    Period  Weeks    Status  Achieved      PT LONG TERM GOAL #2   Title  improve FOTO =/< 35% limited ( 11/26/17)     Time  6    Period  Weeks    Status  Achieved      PT LONG TERM GOAL #3   Title  report =/> 75% reduction of Rt shoulder pain with donning her bra ( 11/26/17)     Time  6    Period  Weeks    Status  Achieved      PT LONG TERM GOAL #4   Title  demo Rt shoulder and upper back strength =/> 5-/5 without pain ( 11/26/17)     Time  6    Period  Weeks    Status  Partially Met            Plan - 11/19/17 1741    Clinical Impression Statement  Pt demonstrated improved strength in Rt shoulder.  She continues to have mild pain with horiz add and also IR, but she is satisfied with current level of function.  Her FOTO score improved to 19% limited. She has partially met her goals and requests to d/c at this time.     Rehab Potential  Excellent    PT Frequency  2x / week    PT Duration  6 weeks    PT Treatment/Interventions  Iontophoresis 4m/ml Dexamethasone;Dry needling;Manual techniques;Moist Heat;Taping;Vasopneumatic Device;Patient/family education;Ultrasound;Cryotherapy;Electrical Stimulation;Passive range of motion;Therapeutic exercise;Therapeutic activities;Neuromuscular re-education    PT Next Visit Plan  spoke to supervising PT; will d/c per pt request.     Consulted and Agree with Plan of Care  Patient       Patient will benefit from skilled therapeutic intervention in order to improve the following deficits and impairments:  Pain, Postural dysfunction, Increased muscle spasms, Decreased strength, Impaired UE functional use, Decreased range of motion, Decreased mobility, Decreased activity tolerance  Visit Diagnosis: Acute pain of right shoulder  Muscle weakness (generalized)  Abnormal posture  Problem List Patient  Active Problem List   Diagnosis Date Noted  . Right shoulder pain 07/14/2017  . Malignant melanoma of skin of canthus of right eye ([place]) 03/16/2017  . PCP NOTES >>>>>>>>>>>>>>> 05/22/2015  . Coronary artery calcification 02/14/2015  . Pain of lower leg 02/15/2014  . Hernia, abdominal 04/01/2013  . Depression with anxiety 04/01/2012  . Malignant neoplasm of upper lobe, bronchus or lung 09/30/2011  . Lung cancer ([place]) 09/25/2011  . Small cell lung carcinoma ([place]) 09/25/2011  . Annual physical exam 09/05/2011  . IBS -diarrhea predominant 09/05/2011  . COPD (chronic obstructive pulmonary disease) ([place]) 09/05/2011  . OSTEOPOROSIS 03/01/2010  . TOBACCO ABUSE 11/14/2008  . Dyslipidemia 05/18/2007  . Essential hypertension 05/18/2007   Kerin Perna, PTA 11/19/17 5:45 PM  [place], [place], [address] Phone: [phone]   Fax:  [phone]  Name: [name] [name] MRN: [id number] Date of Birth: 09-27-1950  PHYSICAL THERAPY DISCHARGE SUMMARY  Visits from Start of Care: 7  Current functional level related to goals / functional outcomes: See progress note for discharge status   Remaining deficits: Continued intermittent problems - should continue with HEP   Education / Equipment: HEP  Plan: Patient agrees to discharge.  Patient goals were partially met. Patient is being discharged due to being pleased with the current functional level.  ?????    Celyn P. Helene Kelp PT, MPH 12/02/17 3:12 PM

## 2017-11-20 ENCOUNTER — Encounter: Payer: BLUE CROSS/BLUE SHIELD | Admitting: Internal Medicine

## 2017-11-26 ENCOUNTER — Telehealth: Payer: Self-pay | Admitting: Internal Medicine

## 2017-11-26 NOTE — Telephone Encounter (Signed)
Patient of Dr. Larose Kells who was referred to [facility] for lipid management. Was originally scheduled to see Dr. Gwenlyn Found but this appointment was cancelled. Called patient and LM about scheduled lipid clinic appointment with Dr. Debara Pickett. Tentatively scheduled this appointment on 12/21/17 @ 0830, as there was an opening on this dedicated "lipid clinic" day.

## 2017-12-21 ENCOUNTER — Ambulatory Visit: Payer: BLUE CROSS/BLUE SHIELD | Admitting: Internal Medicine

## 2017-12-21 ENCOUNTER — Encounter: Payer: Self-pay | Admitting: Internal Medicine

## 2017-12-21 VITALS — BP 122/84 | HR 95 | Ht 63.0 in | Wt 159.7 lb

## 2017-12-21 DIAGNOSIS — E785 Hyperlipidemia, unspecified: Secondary | ICD-10-CM

## 2017-12-21 DIAGNOSIS — R931 Abnormal findings on diagnostic imaging of heart and coronary circulation: Secondary | ICD-10-CM

## 2017-12-21 LAB — LIPID PANEL
CHOL/HDL RATIO: 2.8 ratio (ref 0.0–4.4)
Cholesterol, Total: 243 mg/dL — ABNORMAL HIGH (ref 100–199)
HDL: 86 mg/dL (ref >39)
LDL CALC: 133 mg/dL — AB (ref 0–99)
Triglycerides: 121 mg/dL (ref 0–149)
VLDL CHOLESTEROL CAL: 24 mg/dL (ref 5–40)

## 2017-12-21 MED ORDER — COLESTIPOL HCL 5 G PO PACK: 5.0000 g | PACK | Freq: Two times a day (BID) | ORAL | 12 refills | Status: DC

## 2017-12-21 NOTE — Progress Notes (Signed)
OFFICE CONSULT NOTE  Chief Complaint:  Dyslipidemia, statin intolerance  Primary Care Physician: Colon Branch, MD  HPI:  [name] [name] is a 67 y.o. female who is being seen today for the evaluation of dyslipidemia and statin intolerance at the request of Colon Branch, MD.  [name] [name] is a pleasant 67 year old female with an unfortunate history of lung cancer in the past status post radiation therapy.  She also has hypertension and marked dyslipidemia.  She is currently treated on rosuvastatin 40 mg and ezetimibe 10 mg daily.  Her last lipid profile from January 2019 indicated total cholesterol 334, triglycerides 156, HDL 73 and LDL at 229.  While she does have a high HDL, I suspect that it is likely nonfunctional.  This is actually an atherogenic lipid profile.  In fact, a chest CT scan for follow-up of her lung malignancy and June 2018 demonstrated "dense coronary artery vascular calcifications".  This could be also related to radiation injury to the coronaries.  She does have a history of stroke in her father and A. fib in her mother.  She is not aware of her sister has any cardiovascular disease and she does have 2 children.  Neither of them have been screened for elevated cholesterol.  She also reports eating a very atherogenic diet.  She eats 1-2 eggs several days a week, shrimp, and chicken livers, amongst other foods.  She reports recently having problems with the diarrhea and saw Dr. Henrene Pastor, who prescribed her for Colestid.  This was for the tablets, but she reports also a history of difficulty swallowing tablets.  Based on the size of these tablets, she is not taking the medication.  PMHx:  Past Medical History:  Diagnosis Date  . Anxiety   . Bilateral lower extremity pain   . Cholelithiasis   . Colon polyps    adenomatous  . High cholesterol   . Hx of radiation therapy 10/07/11 to 11/20/11   right lung  . Hx of radiation therapy 01/07/2012- 01/21/12   cranial irradiation   . Hypertension   . Lung cancer ([place]) 09/19/11   Stage III currently in remission  . Malignant melanoma of skin of canthus of right eye ([place]) 02/2017  . Osteoporosis 04/2014   Dexa scan by Dr. Corinna Capra, T-score -2.7, next in 2 years, with at least 13 % major fracture in 10 years  . Pitting edema    Bilateral lower extremities, duplex ultrasound 04/03/2015 normal, no DVT    Past Surgical History:  Procedure Laterality Date  . CHOLECYSTECTOMY  04/2011   biliary stent placement  . [place]  2015  . MOHS SURGERY Right 02/2017   cheek and eyelid  . Josph Macho Touch     Vaginal procedure  . TUBAL LIGATION      FAMHx:  Family History  Problem Relation Age of Onset  . Bladder Cancer Father   . Stroke Father   . Colon polyps Father   . Atrial fibrillation Mother   . Heart disease Mother        CHF  . Clotting disorder Mother        warfarin  . Colon polyps Mother   . Colon cancer Maternal Aunt 59  . Colon polyps Sister   . Breast cancer Neg Hx     SOCHx:   reports that she quit smoking about 6 years ago. Her smoking use included cigarettes. She has never used smokeless tobacco. She reports that she drinks  alcohol. She reports that she does not use drugs.  ALLERGIES:  Allergies  Allergen Reactions  . Codeine Nausea And Vomiting  . Penicillins Rash    ROS: Pertinent items noted in HPI and remainder of comprehensive ROS otherwise negative.  HOME MEDS: Current Outpatient Medications on File Prior to Visit  Medication Sig Dispense Refill  . ALPRAZolam (XANAX) 0.5 MG tablet Take 1-2 tablets (0.5-1 mg total) by mouth daily as needed for anxiety. 40 tablet 2  . aspirin EC 81 MG tablet Take 81 mg daily by mouth.    . Cholecalciferol (VITAMIN D3) 2000 UNITS capsule Take 4,000 Units by mouth daily.     . citalopram (CELEXA) 40 MG tablet Take 1 tablet (40 mg total) by mouth daily. 30 tablet 5  . Cyanocobalamin 2500 MCG CHEW Chew 2 tablets by mouth daily.    Marland Kitchen ezetimibe  (ZETIA) 10 MG tablet Take 1 tablet (10 mg total) by mouth daily. 30 tablet 5  . lisinopril (PRINIVIL,ZESTRIL) 20 MG tablet Take 1 tablet (20 mg total) by mouth daily. 30 tablet 5  . Loperamide HCl (IMODIUM PO) Take 3 mLs by mouth daily. Reported on 02/07/2016    . raloxifene (EVISTA) 60 MG tablet 20 mg daily  12  . rosuvastatin (CRESTOR) 40 MG tablet Take 1 tablet (40 mg total) by mouth at bedtime. 90 tablet 0  . [DISCONTINUED] sucralfate (CARAFATE) 1 G tablet Take 1 tablet (1 g total) by mouth 4 (four) times daily. Dissolve tablet in 15cc water. 120 tablet 1   No current facility-administered medications on file prior to visit.     LABS/IMAGING: No results found for this or any previous visit (from the past 48 hour(s)). No results found.  LIPID PANEL:    Component Value Date/Time   CHOL 334 (H) 09/24/2017 0902   TRIG 156.0 (H) 09/24/2017 0902   HDL 73.30 09/24/2017 0902   CHOLHDL 5 09/24/2017 0902   VLDL 31.2 09/24/2017 0902   LDLCALC 229 (H) 09/24/2017 0902   LDLDIRECT 282.7 12/03/2012 1138    WEIGHTS: Wt Readings from Last 3 Encounters:  12/21/17 159 lb 11.2 oz (72.4 kg)  09/24/17 156 lb (70.8 kg)  09/23/17 157 lb (71.2 kg)    VITALS: BP 122/84   Pulse 95   Ht 5\' 3"  (1.6 m)   Wt 159 lb 11.2 oz (72.4 kg)   BMI 28.29 kg/m   EXAM: General appearance: alert and no distress Neck: no carotid bruit, no JVD and thyroid not enlarged, symmetric, no tenderness/mass/nodules Lungs: clear to auscultation bilaterally Heart: regular rate and rhythm Abdomen: soft, non-tender; bowel sounds normal; no masses,  no organomegaly Extremities: extremities normal, atraumatic, no cyanosis or edema Pulses: 2+ and symmetric Skin: Skin color, texture, turgor normal. No rashes or lesions Neurologic: Grossly normal Psych: Pleasant  EKG: N/A  ASSESSMENT: 1. Extensive coronary artery calcification 2. Dyslipidemia not at goal LDL less than 70 3. Probable FH - Dutch score of  6 4. Atherogenic diet  PLAN: 1.   [name] [name] has extensive coronary calcification seen on CT scan.  Started know if this is acquired coronary disease or related possibly to radiation therapy of her chest.  She has marked dyslipidemia and elevated LDL cholesterol.  Based on her levels she likely has FH.  Her Dutch Score is 6, suggesting probable FH.  I would like to repeat a lipid profile now to see how she is doing with better compliance of her medications.  In addition we discussed significant dietary  changes that she needs to make over the next several months to provide her with literature on that.  Based on her lipid profile, she may be candidate for the addition of a PCSK9 inhibitor.  She actually would prefer a non-pill form of treatment due to significant difficulty swallowing pills.  On that note, I have switched her Colestid over to the soluble packets.  She will need to take that twice daily, but it may help with her diarrhea and certainly will also lower her cholesterol.  Plan follow-up with me in 1 month to further discuss therapy options.  Thanks again for the kind referral.  Pixie Casino, MD, FACC, [place] Director of the [facility] Diplomate of the American Board of Clinical Lipidology Attending Cardiologist  Direct Dial: [phone]  Fax: [phone]  Website:  [email or web address]  Nadean Corwin Camauri Craton 12/21/2017, 1:26 PM

## 2017-12-21 NOTE — Patient Instructions (Signed)
Medication Instructions:   TAKE colestid 5g packet twice daily  Labwork:  TODAY  Testing/Procedures:  NONE  Follow-Up:  ONE MONTH with Dr. Debara Pickett  If you need a refill on your cardiac medications before your next appointment, please call your pharmacy.  Any Other Special Instructions Will Be Listed Below (If Applicable).

## 2017-12-25 ENCOUNTER — Other Ambulatory Visit: Payer: Self-pay | Admitting: *Deleted

## 2017-12-25 DIAGNOSIS — E785 Hyperlipidemia, unspecified: Secondary | ICD-10-CM

## 2018-01-06 ENCOUNTER — Ambulatory Visit: Payer: BLUE CROSS/BLUE SHIELD | Admitting: Internal Medicine

## 2018-01-22 ENCOUNTER — Ambulatory Visit: Payer: BLUE CROSS/BLUE SHIELD | Admitting: Internal Medicine

## 2018-02-02 ENCOUNTER — Telehealth: Payer: Self-pay | Admitting: *Deleted

## 2018-02-02 NOTE — Telephone Encounter (Signed)
Copied from [place] [id number]. Topic: General - Other >> Feb 02, 2018  5:17 PM Marin Olp L wrote: Reason for CRM: Patient wants to know her blood type?

## 2018-02-03 NOTE — Telephone Encounter (Signed)
LMOM informing Pt of her blood type. Instructed to call if questions/concerns.

## 2018-02-03 NOTE — Telephone Encounter (Signed)
Per chart- Pt is A positive. Will inform Pt shortly.

## 2018-02-05 ENCOUNTER — Ambulatory Visit (HOSPITAL_COMMUNITY)
Admission: RE | Admit: 2018-02-05 | Discharge: 2018-02-05 | Disposition: A | Discharge status: Home / Self Care | Payer: Medicare Other | Source: Ambulatory Visit | Attending: Internal Medicine | Admitting: Internal Medicine

## 2018-02-05 ENCOUNTER — Inpatient Hospital Stay: Payer: Medicare Other | Attending: Internal Medicine

## 2018-02-05 DIAGNOSIS — Z9221 Personal history of antineoplastic chemotherapy: Secondary | ICD-10-CM | POA: Insufficient documentation

## 2018-02-05 DIAGNOSIS — Z85118 Personal history of other malignant neoplasm of bronchus and lung: Secondary | ICD-10-CM | POA: Insufficient documentation

## 2018-02-05 DIAGNOSIS — I700 Atherosclerosis of aorta: Secondary | ICD-10-CM | POA: Insufficient documentation

## 2018-02-05 DIAGNOSIS — Z08 Encounter for follow-up examination after completed treatment for malignant neoplasm: Secondary | ICD-10-CM | POA: Diagnosis not present

## 2018-02-05 DIAGNOSIS — C3490 Malignant neoplasm of unspecified part of unspecified bronchus or lung: Secondary | ICD-10-CM

## 2018-02-05 DIAGNOSIS — Z923 Personal history of irradiation: Secondary | ICD-10-CM | POA: Insufficient documentation

## 2018-02-05 DIAGNOSIS — J929 Pleural plaque without asbestos: Secondary | ICD-10-CM | POA: Diagnosis not present

## 2018-02-05 LAB — CBC WITH DIFFERENTIAL/PLATELET
BASOS ABS: 0 10*3/uL (ref 0.0–0.1)
Basophils Relative: 1 %
Eosinophils Absolute: 0.2 10*3/uL (ref 0.0–0.5)
Eosinophils Relative: 3 %
HEMATOCRIT: 39.1 % (ref 34.8–46.6)
Hemoglobin: 13.1 g/dL (ref 11.6–15.9)
LYMPHS ABS: 0.6 10*3/uL — AB (ref 0.9–3.3)
LYMPHS PCT: 12 %
MCH: 31.3 pg (ref 25.1–34.0)
MCHC: 33.5 g/dL (ref 31.5–36.0)
MCV: 93.6 fL (ref 79.5–101.0)
MONO ABS: 0.5 10*3/uL (ref 0.1–0.9)
MONOS PCT: 9 %
NEUTROS ABS: 4.1 10*3/uL (ref 1.5–6.5)
Neutrophils Relative %: 75 %
Platelets: 222 10*3/uL (ref 145–400)
RBC: 4.18 MIL/uL (ref 3.70–5.45)
RDW: 12.9 % (ref 11.2–14.5)
WBC: 5.4 10*3/uL (ref 3.9–10.3)

## 2018-02-05 LAB — COMPREHENSIVE METABOLIC PANEL
ALT: 14 U/L (ref 0–55)
AST: 22 U/L (ref 5–34)
Albumin: 4.3 g/dL (ref 3.5–5.0)
Alkaline Phosphatase: 61 U/L (ref 40–150)
Anion gap: 9 (ref 3–11)
BUN: 19 mg/dL (ref 7–26)
CO2: 27 mmol/L (ref 22–29)
CREATININE: 0.94 mg/dL (ref 0.60–1.10)
Calcium: 9.9 mg/dL (ref 8.4–10.4)
Chloride: 104 mmol/L (ref 98–109)
GFR calc Af Amer: >60 mL/min (ref >60)
Glucose, Bld: 103 mg/dL (ref 70–140)
POTASSIUM: 4.5 mmol/L (ref 3.5–5.1)
Sodium: 140 mmol/L (ref 136–145)
Total Bilirubin: 0.4 mg/dL (ref 0.2–1.2)
Total Protein: 7.5 g/dL (ref 6.4–8.3)

## 2018-02-05 MED ORDER — IOHEXOL 300 MG/ML  SOLN
75.0000 mL | Freq: Once | INTRAMUSCULAR | Status: AC | PRN
  Administered 2018-02-05: 75 mL via INTRAVENOUS

## 2018-02-08 ENCOUNTER — Telehealth: Payer: Self-pay | Admitting: Internal Medicine

## 2018-02-08 ENCOUNTER — Inpatient Hospital Stay (HOSPITAL_BASED_OUTPATIENT_CLINIC_OR_DEPARTMENT_OTHER): Payer: Medicare Other | Admitting: Internal Medicine

## 2018-02-08 ENCOUNTER — Encounter: Payer: Self-pay | Admitting: Internal Medicine

## 2018-02-08 VITALS — BP 129/89 | HR 103 | Temp 98.8°F | Resp 18 | Ht 63.0 in | Wt 162.9 lb

## 2018-02-08 DIAGNOSIS — Z85118 Personal history of other malignant neoplasm of bronchus and lung: Secondary | ICD-10-CM | POA: Diagnosis not present

## 2018-02-08 DIAGNOSIS — C3490 Malignant neoplasm of unspecified part of unspecified bronchus or lung: Secondary | ICD-10-CM | POA: Diagnosis not present

## 2018-02-08 DIAGNOSIS — Z923 Personal history of irradiation: Secondary | ICD-10-CM | POA: Diagnosis not present

## 2018-02-08 DIAGNOSIS — Z9221 Personal history of antineoplastic chemotherapy: Secondary | ICD-10-CM | POA: Diagnosis not present

## 2018-02-08 DIAGNOSIS — R931 Abnormal findings on diagnostic imaging of heart and coronary circulation: Secondary | ICD-10-CM | POA: Diagnosis not present

## 2018-02-08 NOTE — Telephone Encounter (Signed)
Appointments scheduled AVS/Calendar printed.  Also provided [facility] phone number for CT scan to be scheduled. Per 6/17 los

## 2018-02-08 NOTE — Progress Notes (Signed)
[facility] [phone]   Fax:([phone]) [place], MD [address] [place] [address]  DIAGNOSIS: Limited stage small cell lung cancer diagnosed in January of 2013   PRIOR THERAPY:  1) Systemic chemotherapy with cisplatin at 60 mg per meter square given on day 1 and etoposide at 120 mg per meter square given on days one 2 and 3 with Neulasta support given on day 4 status post 4 cycles, last cycle was given on 12/02/2011 . This with concurrent with radiotherapy under the care of Dr. Lisbeth Renshaw.  2) prophylactic cranial irradiation under the care of Dr. Lisbeth Renshaw completed on 01/21/2012   CURRENT THERAPY: Observation.  INTERVAL HISTORY: [name] [name] 67 y.o. female returns to the clinic today for annual follow-up visit accompanied by her sister.  The patient is feeling fine today with no specific complaints.  She will be celebrating her 67th birthday in 2 days.  She denied having any chest pain, shortness of breath, cough or hemoptysis.  She denied having any fever or chills.  She has no nausea, vomiting, diarrhea or constipation.  She denied having any significant weight loss or night sweats.  The patient had repeat CT scan of the chest performed recently and she is here for evaluation and discussion of her risk her results.  MEDICAL HISTORY: Past Medical History:  Diagnosis Date  . Anxiety   . Bilateral lower extremity pain   . Cholelithiasis   . Colon polyps    adenomatous  . High cholesterol   . Hx of radiation therapy 10/07/11 to 11/20/11   right lung  . Hx of radiation therapy 01/07/2012- 01/21/12   cranial irradiation  . Hypertension   . Lung cancer ([facility]) 09/19/11   Stage III currently in remission  . Malignant melanoma of skin of canthus of right eye ([facility]) 02/2017  . Osteoporosis 04/2014   Dexa scan by Dr. Corinna Capra, T-score -2.7, next in 2 years, with at least 13 % major fracture in 10 years  .  Pitting edema    Bilateral lower extremities, duplex ultrasound 04/03/2015 normal, no DVT    ALLERGIES:  is allergic to codeine and penicillins.  MEDICATIONS:  Current Outpatient Medications  Medication Sig Dispense Refill  . ALPRAZolam (XANAX) 0.5 MG tablet Take 1-2 tablets (0.5-1 mg total) by mouth daily as needed for anxiety. 40 tablet 2  . aspirin EC 81 MG tablet Take 81 mg daily by mouth.    . Cholecalciferol (VITAMIN D3) 2000 UNITS capsule Take 4,000 Units by mouth daily.     . citalopram (CELEXA) 40 MG tablet Take 1 tablet (40 mg total) by mouth daily. 30 tablet 5  . colestipol (COLESTID) 5 g packet Take 5 g by mouth 2 (two) times daily. 60 each 12  . Cyanocobalamin 2500 MCG CHEW Chew 2 tablets by mouth daily.    Marland Kitchen ezetimibe (ZETIA) 10 MG tablet Take 1 tablet (10 mg total) by mouth daily. 30 tablet 5  . lisinopril (PRINIVIL,ZESTRIL) 20 MG tablet Take 1 tablet (20 mg total) by mouth daily. 30 tablet 5  . Loperamide HCl (IMODIUM PO) Take 3 mLs by mouth daily. Reported on 02/07/2016    . raloxifene (EVISTA) 60 MG tablet 20 mg daily  12  . rosuvastatin (CRESTOR) 40 MG tablet Take 1 tablet (40 mg total) by mouth at bedtime. 90 tablet 0   No current facility-administered medications for this visit.  SURGICAL HISTORY:  Past Surgical History:  Procedure Laterality Date  . CHOLECYSTECTOMY  04/2011   biliary stent placement  . [place]  2015  . MOHS SURGERY Right 02/2017   cheek and eyelid  . Josph Macho Touch     Vaginal procedure  . TUBAL LIGATION      REVIEW OF SYSTEMS:  A comprehensive review of systems was negative.   PHYSICAL EXAMINATION: General appearance: alert, cooperative and no distress Head: Normocephalic, without obvious abnormality, atraumatic Neck: no adenopathy Lymph nodes: Cervical, supraclavicular, and axillary nodes normal. Resp: clear to auscultation bilaterally Back: symmetric, no curvature. ROM normal. No CVA tenderness. Cardio: regular  rate and rhythm, S1, S2 normal, no murmur, click, rub or gallop GI: soft, non-tender; bowel sounds normal; no masses,  no organomegaly Extremities: extremities normal, atraumatic, no cyanosis or edema  ECOG PERFORMANCE STATUS: 0 - Asymptomatic  Blood pressure 129/89, pulse (!) 103, temperature 98.8 F (37.1 C), temperature source Oral, resp. rate 18, height 5\' 3"  (1.6 m), weight 162 lb 14.4 oz (73.9 kg), SpO2 99 %.  LABORATORY DATA: Lab Results  Component Value Date   WBC 5.4 02/05/2018   HGB 13.1 02/05/2018   HCT 39.1 02/05/2018   MCV 93.6 02/05/2018   PLT 222 02/05/2018      Chemistry      Component Value Date/Time   NA 140 02/05/2018 0853   NA 143 02/05/2017 0817   K 4.5 02/05/2018 0853   K 4.3 02/05/2017 0817   CL 104 02/05/2018 0853   CL 104 12/23/2012 1338   CO2 27 02/05/2018 0853   CO2 25 02/05/2017 0817   BUN 19 02/05/2018 0853   BUN 16.0 02/05/2017 0817   CREATININE 0.94 02/05/2018 0853   CREATININE 1.0 02/05/2017 0817      Component Value Date/Time   CALCIUM 9.9 02/05/2018 0853   CALCIUM 9.5 02/05/2017 0817   ALKPHOS 61 02/05/2018 0853   ALKPHOS 48 02/05/2017 0817   AST 22 02/05/2018 0853   AST 21 02/05/2017 0817   ALT 14 02/05/2018 0853   ALT 12 02/05/2017 0817   BILITOT 0.4 02/05/2018 0853   BILITOT 0.31 02/05/2017 0817       RADIOGRAPHIC STUDIES: Ct Chest W Contrast  Result Date: 02/05/2018 CLINICAL DATA:  Lung cancer diagnosed in January 2013. Chemotherapy and radiation therapy completed in 2013. EXAM: CT CHEST WITH CONTRAST TECHNIQUE: Multidetector CT imaging of the chest was performed during intravenous contrast administration. CONTRAST:  78mL OMNIPAQUE IOHEXOL 300 MG/ML  SOLN COMPARISON:  Chest CT 01/30/2017 and 02/05/2016. FINDINGS: Cardiovascular: Diffuse coronary artery atherosclerosis. There is minimal atherosclerosis of the aorta and great vessels. No acute vascular findings are seen. The heart size is normal. There is no pericardial  effusion. Mediastinum/Nodes: There are no enlarged mediastinal, hilar or axillary lymph nodes.There is stable soft tissue thickening around the right hilum attributed to previous radiation therapy. The thyroid gland, trachea and esophagus demonstrate no significant findings. Lungs/Pleura: There is no pleural effusion. There are stable radiation changes in the right perihilar region with associated pleural thickening posteriorly. No evidence of recurrent tumor or suspicious pulmonary nodularity. Mild scarring is present at both apices. Upper abdomen: The visualized upper abdomen appears stable without significant findings. There is stable mild biliary prominence post cholecystectomy. Musculoskeletal/Chest wall: There is no chest wall mass or suspicious osseous finding. IMPRESSION: 1. Stable post radiation changes in the right hemithorax. 2. No evidence of local recurrence or metastatic disease. 3. Coronary artery atherosclerosis. Electronically Signed  By: Richardean Sale M.D.   On: 02/05/2018 12:17   ASSESSMENT AND PLAN:  This is a very pleasant 67 years old white female with limited stage small cell lung cancer status post systemic chemotherapy with cisplatin and etoposide concurrent with radiation and followed by prophylactic cranial irradiation completed in May 2013. The patient has been in observation for the last 6 years and she is feeling fine.  Repeat CT scan of the chest performed recently showed no concerning findings for local recurrence or metastatic disease.  I discussed the scan results with the patient and her sister and recommended for her to continue on observation with repeat CT scan of the chest in 1 year.  She was advised to call immediately if she has any concerning symptoms in the interval. The patient voices understanding of current disease status and treatment options and is in agreement with the current care plan. All questions were answered. The patient knows to call the clinic with  any problems, questions or concerns. We can certainly see the patient much sooner if necessary.  Disclaimer: This note was dictated with voice recognition software. Similar sounding words can inadvertently be transcribed and may not be corrected upon review.

## 2018-02-17 ENCOUNTER — Ambulatory Visit: Payer: BLUE CROSS/BLUE SHIELD | Admitting: Internal Medicine

## 2018-03-10 ENCOUNTER — Other Ambulatory Visit: Payer: Self-pay

## 2018-03-11 ENCOUNTER — Ambulatory Visit (INDEPENDENT_AMBULATORY_CARE_PROVIDER_SITE_OTHER): Payer: Medicare Other | Admitting: Internal Medicine

## 2018-03-11 ENCOUNTER — Encounter: Payer: Self-pay | Admitting: Internal Medicine

## 2018-03-11 VITALS — BP 126/62 | HR 101 | Temp 98.7°F | Resp 16 | Ht 63.0 in | Wt 159.4 lb

## 2018-03-11 DIAGNOSIS — R931 Abnormal findings on diagnostic imaging of heart and coronary circulation: Secondary | ICD-10-CM | POA: Diagnosis not present

## 2018-03-11 DIAGNOSIS — F418 Other specified anxiety disorders: Secondary | ICD-10-CM

## 2018-03-11 DIAGNOSIS — I10 Essential (primary) hypertension: Secondary | ICD-10-CM | POA: Diagnosis not present

## 2018-03-11 DIAGNOSIS — E785 Hyperlipidemia, unspecified: Secondary | ICD-10-CM | POA: Diagnosis not present

## 2018-03-11 MED ORDER — ZOSTER VAC RECOMB ADJUVANTED 50 MCG/0.5ML IM SUSR: 0.5000 mL | Freq: Once | INTRAMUSCULAR | 1 refills | Status: AC

## 2018-03-11 NOTE — Patient Instructions (Addendum)
GO TO THE FRONT DESK Schedule your next appointment for a physical exam by 06-2018  Continue the same medications  Please get a tetanus shot booster at a pharmacy and also Shingrix immunization

## 2018-03-11 NOTE — Assessment & Plan Note (Signed)
HTN: Controlled on lisinopril Dyslipidemia: Seen by cardiology, on Crestor 40 mg and Zetia.  LDL decreased to 133, colestipol was added.  Follow-up with cardiology next month. Depression, anxiety: On citalopram, lost a son to drugs last week, going through a rough patch but  doing "okay" for the circumstances.  Knows to call me if needed, denies suicidal ideas, offered formal counseling, declined for now. Lung cancer: Saw Dr. Earlie Server on July, good reports. Shoulder pain: Much improved after seeing Dr. Barbaraann Barthel & doing physical therapy RTC 06-2018 CPX

## 2018-03-11 NOTE — Progress Notes (Signed)
Pre visit review using our clinic review tool, if applicable. No additional management support is needed unless otherwise documented below in the visit note. 

## 2018-03-11 NOTE — Progress Notes (Signed)
Subjective:    Patient ID: [name] [name], female    DOB: 1951-06-06, 67 y.o.   MRN: [id number]  DOS:  03/11/2018 Type of visit - description : ROV Interval history: Notes from cardiology and oncology reviewed. The patient lost her son on last week, she is obviously very affected but overall "okay" for the circumstances.  Did require a couple of Xanax last week.   Review of Systems Has chronic diarrhea, was prescribed colestipol by cardiology and the diarrhea has slowed down.  Past Medical History:  Diagnosis Date  . Anxiety   . Bilateral lower extremity pain   . Cholelithiasis   . Colon polyps    adenomatous  . High cholesterol   . Hx of radiation therapy 10/07/11 to 11/20/11   right lung  . Hx of radiation therapy 01/07/2012- 01/21/12   cranial irradiation  . Hypertension   . Lung cancer ([facility]) 09/19/11   Stage III currently in remission  . Malignant melanoma of skin of canthus of right eye ([place]) 02/2017  . Osteoporosis 04/2014   Dexa scan by Dr. Corinna Capra, T-score -2.7, next in 2 years, with at least 13 % major fracture in 10 years  . Pitting edema    Bilateral lower extremities, duplex ultrasound 04/03/2015 normal, no DVT    Past Surgical History:  Procedure Laterality Date  . CHOLECYSTECTOMY  04/2011   biliary stent placement  . [place]  2015  . MOHS SURGERY Right 02/2017   cheek and eyelid  . Josph Macho Touch     Vaginal procedure  . TUBAL LIGATION      Social History   Socioeconomic History  . Marital status: Divorced    Spouse name: Not on file  . Number of children: 2  . Years of education: Not on file  . Highest education level: Not on file  Occupational History  . Occupation: retired, Lexicographer  . Financial resource strain: Not on file  . Food insecurity:    Worry: Not on file    Inability: Not on file  . Transportation needs:    Medical: Not on file    Non-medical: Not on file  Tobacco Use  . Smoking status: Former  Smoker    Types: Cigarettes    Last attempt to quit: 09/08/2011    Years since quitting: 6.5  . Smokeless tobacco: Never Used  Substance and Sexual Activity  . Alcohol use: Yes    Comment: occasional  . Drug use: No  . Sexual activity: Not on file  Lifestyle  . Physical activity:    Days per week: Not on file    Minutes per session: Not on file  . Stress: Not on file  Relationships  . Social connections:    Talks on phone: Not on file    Gets together: Not on file    Attends religious service: Not on file    Active member of club or organization: Not on file    Attends meetings of clubs or organizations: Not on file    Relationship status: Not on file  . Intimate partner violence:    Fear of current or ex partner: Not on file    Emotionally abused: Not on file    Physically abused: Not on file    Forced sexual activity: Not on file  Other Topics Concern  . Not on file  Social History Narrative   divorced, 2 kids, lives by herself, has a boyfriend  Allergies as of 03/11/2018      Reactions   Codeine Nausea And Vomiting   Penicillins Rash      Medication List        Accurate as of 03/11/18  6:06 PM. Always use your most recent med list.          ALPRAZolam 0.5 MG tablet Commonly known as:  XANAX Take 0.5 mg by mouth daily as needed for anxiety. Take 1-2 tablets by mouth daily as needed for anxiety   aspirin EC 81 MG tablet Take 81 mg daily by mouth.   citalopram 40 MG tablet Commonly known as:  CELEXA Take 1 tablet (40 mg total) by mouth daily.   colestipol 5 g packet Commonly known as:  COLESTID Take 5 g by mouth 2 (two) times daily.   Cyanocobalamin 2500 MCG Chew Chew 2 tablets by mouth daily.   ezetimibe 10 MG tablet Commonly known as:  ZETIA Take 1 tablet (10 mg total) by mouth daily.   IMODIUM PO Take 3 mLs by mouth daily. Reported on 02/07/2016   lisinopril 20 MG tablet Commonly known as:  PRINIVIL,ZESTRIL Take 1 tablet (20  mg total) by mouth daily.   raloxifene 60 MG tablet Commonly known as:  EVISTA   rosuvastatin 40 MG tablet Commonly known as:  CRESTOR Take 1 tablet (40 mg total) by mouth at bedtime.   Vitamin D3 2000 units capsule Take 4,000 Units by mouth daily.   Zoster Vaccine Adjuvanted injection Commonly known as:  SHINGRIX Inject 0.5 mLs into the muscle once for 1 dose.          Objective:   Physical Exam BP 126/62 (BP Location: Left Arm, Patient Position: Sitting, Cuff Size: Small)   Pulse (!) 101   Temp 98.7 F (37.1 C) (Oral)   Resp 16   Ht 5\' 3"  (1.6 m)   Wt 159 lb 6 oz (72.3 kg)   SpO2 97%   BMI 28.23 kg/m  General:   Well developed, NAD, see BMI.  HEENT:  Normocephalic . Face symmetric, atraumatic Lungs:  CTA B Normal respiratory effort, no intercostal retractions, no accessory muscle use. Heart: RRR,  no murmur.  No pretibial edema bilaterally  Skin: Not pale. Not jaundice Neurologic:  alert & oriented X3.  Speech normal, gait appropriate for age and unassisted Psych--  Cognition and judgment appear intact.  Cooperative with normal attention span and concentration.  Behavior appropriate. No anxious or depressed appearing.      Assessment & Plan:   Assessment   HTN Hyperlipidemia -- cramps with Lipitor dc 2015 Depression . Anxiety -- on citalopram, rarely uses Xanax Osteoporosis-- per DEXA at gyn 05-10-2014 --->  was on boniva, now evista  B12 deficiency dx 01/2014 Vit D  def  COPD: Mild last PFTs  Oncology: -Lung cancer SCC DX 2013, XRT chest and brain -melanoma 2018, R face, s/p Mohs Lower extremity edema [place] (-) for DVT 03-2015 Coronary calcifications per CT chest 6 2016 IBS, diarrhea predominant   on Imodium as needed; GI 2015-- rx questran, self d/c d/t constipation ASA intolerant (tinnitus) but ok  taking low-dose  PLAN: HTN: Controlled on lisinopril Dyslipidemia: Seen by cardiology, on Crestor 40 mg and Zetia.  LDL decreased to 133, colestipol  was added.  Follow-up with cardiology next month. Depression, anxiety: On citalopram, lost a son to drugs last week, going through a rough patch but  doing "okay" for the circumstances.  Knows to call me if needed, denies  suicidal ideas, offered formal counseling, declined for now. Lung cancer: Saw Dr. Earlie Server on July, good reports. Shoulder pain: Much improved after seeing Dr. Barbaraann Barthel & doing physical therapy RTC 06-2018 CPX

## 2018-03-24 ENCOUNTER — Other Ambulatory Visit: Payer: Self-pay | Admitting: Internal Medicine

## 2018-03-24 DIAGNOSIS — E785 Hyperlipidemia, unspecified: Secondary | ICD-10-CM

## 2018-03-25 ENCOUNTER — Ambulatory Visit (INDEPENDENT_AMBULATORY_CARE_PROVIDER_SITE_OTHER): Payer: Medicare Other | Admitting: Internal Medicine

## 2018-03-25 ENCOUNTER — Encounter: Payer: Self-pay | Admitting: Internal Medicine

## 2018-03-25 VITALS — BP 120/82 | HR 103 | Ht 63.0 in | Wt 164.8 lb

## 2018-03-25 DIAGNOSIS — R931 Abnormal findings on diagnostic imaging of heart and coronary circulation: Secondary | ICD-10-CM | POA: Diagnosis not present

## 2018-03-25 DIAGNOSIS — E785 Hyperlipidemia, unspecified: Secondary | ICD-10-CM | POA: Diagnosis not present

## 2018-03-25 DIAGNOSIS — E7849 Other hyperlipidemia: Secondary | ICD-10-CM

## 2018-03-25 NOTE — Patient Instructions (Signed)
Medication Instructions:   Please take Colestid twice daily  Labwork:  FASTING lab work today to check cholesterol  Testing/Procedures:  NONE  Follow-Up:  Your physician recommends that you schedule a follow-up appointment in [place] (lipid clinic) with Dr. Debara Pickett   If you need a refill on your cardiac medications before your next appointment, please call your pharmacy.  Any Other Special Instructions Will Be Listed Below (If Applicable).

## 2018-03-25 NOTE — Progress Notes (Signed)
OFFICE CONSULT NOTE  Chief Complaint:  Dyslipidemia, statin intolerance  Primary Care Physician: Colon Branch, MD  HPI:  [name] [name] is a 67 y.o. female who is being seen today for the evaluation of dyslipidemia and statin intolerance at the request of Colon Branch, MD.  [name] [name] is a pleasant 67 year old female with an unfortunate history of lung cancer in the past status post radiation therapy.  She also has hypertension and marked dyslipidemia.  She is currently treated on rosuvastatin 40 mg and ezetimibe 10 mg daily.  Her last lipid profile from January 2019 indicated total cholesterol 334, triglycerides 156, HDL 73 and LDL at 229.  While she does have a high HDL, I suspect that it is likely nonfunctional.  This is actually an atherogenic lipid profile.  In fact, a chest CT scan for follow-up of her lung malignancy and June 2018 demonstrated "dense coronary artery vascular calcifications".  This could be also related to radiation injury to the coronaries.  She does have a history of stroke in her father and A. fib in her mother.  She is not aware of her sister has any cardiovascular disease and she does have 2 children.  Neither of them have been screened for elevated cholesterol.  She also reports eating a very atherogenic diet.  She eats 1-2 eggs several days a week, shrimp, and chicken livers, amongst other foods.  She reports recently having problems with the diarrhea and saw Dr. Henrene Pastor, who prescribed her for Colestid.  This was for the tablets, but she reports also a history of difficulty swallowing tablets.  Based on the size of these tablets, she is not taking the medication.  03/25/2018  [name] [name] returns today for follow-up.  She is done very well at lowering her cholesterol.  She is made significant dietary changes and has been taking Colestid once daily (not twice daily) in addition to rosuvastatin 40 mg.  She is also on Zetia 10 mg daily.  Her LDL cholesterol  has gone down from 229-133.  She still not at goal given her extensive atherosclerosis of LDL less than 70.  Her last lipid profile was in April and will need to repeat that today.  PMHx:  Past Medical History:  Diagnosis Date  . Anxiety   . Bilateral lower extremity pain   . Cholelithiasis   . Colon polyps    adenomatous  . High cholesterol   . Hx of radiation therapy 10/07/11 to 11/20/11   right lung  . Hx of radiation therapy 01/07/2012- 01/21/12   cranial irradiation  . Hypertension   . Lung cancer ([place]) 09/19/11   Stage III currently in remission  . Malignant melanoma of skin of canthus of right eye ([place]) 02/2017  . Osteoporosis 04/2014   Dexa scan by Dr. Corinna Capra, T-score -2.7, next in 2 years, with at least 13 % major fracture in 10 years  . Pitting edema    Bilateral lower extremities, duplex ultrasound 04/03/2015 normal, no DVT    Past Surgical History:  Procedure Laterality Date  . CHOLECYSTECTOMY  04/2011   biliary stent placement  . [place]  2015  . MOHS SURGERY Right 02/2017   cheek and eyelid  . Josph Macho Touch     Vaginal procedure  . TUBAL LIGATION      FAMHx:  Family History  Problem Relation Age of Onset  . Bladder Cancer Father   . Stroke Father   . Colon polyps Father   .  Atrial fibrillation Mother   . Heart disease Mother        CHF  . Clotting disorder Mother        warfarin  . Colon polyps Mother   . Colon cancer Maternal Aunt 67  . Colon polyps Sister   . Drug abuse Son   . Breast cancer Neg Hx     SOCHx:   reports that she quit smoking about 6 years ago. Her smoking use included cigarettes. She has never used smokeless tobacco. She reports that she drinks alcohol. She reports that she does not use drugs.  ALLERGIES:  Allergies  Allergen Reactions  . Codeine Nausea And Vomiting  . Penicillins Rash    ROS: Pertinent items noted in HPI and remainder of comprehensive ROS otherwise negative.  HOME MEDS: Current Outpatient  Medications on File Prior to Visit  Medication Sig Dispense Refill  . ALPRAZolam (XANAX) 0.5 MG tablet Take 0.5 mg by mouth daily as needed for anxiety. Take 1-2 tablets by mouth daily as needed for anxiety    . aspirin EC 81 MG tablet Take 81 mg daily by mouth.    . Cholecalciferol (VITAMIN D3) 2000 UNITS capsule Take 4,000 Units by mouth daily.     . citalopram (CELEXA) 40 MG tablet Take 1 tablet (40 mg total) by mouth daily. 30 tablet 5  . colestipol (COLESTID) 5 g packet Take 5 g by mouth 2 (two) times daily. 60 each 12  . Cyanocobalamin 2500 MCG CHEW Chew 2 tablets by mouth daily.    Marland Kitchen ezetimibe (ZETIA) 10 MG tablet Take 1 tablet (10 mg total) by mouth daily. 30 tablet 5  . lisinopril (PRINIVIL,ZESTRIL) 20 MG tablet Take 1 tablet (20 mg total) by mouth daily. 30 tablet 5  . Loperamide HCl (IMODIUM PO) Take 3 mLs by mouth daily. Reported on 02/07/2016    . raloxifene (EVISTA) 60 MG tablet   12  . rosuvastatin (CRESTOR) 40 MG tablet Take 1 tablet (40 mg total) by mouth daily. 90 tablet 1   No current facility-administered medications on file prior to visit.     LABS/IMAGING: No results found for this or any previous visit (from the past 48 hour(s)). No results found.  LIPID PANEL:    Component Value Date/Time   CHOL 243 (H) 12/21/2017 0921   TRIG 121 12/21/2017 0921   HDL 86 12/21/2017 0921   CHOLHDL 2.8 12/21/2017 0921   CHOLHDL 5 09/24/2017 0902   VLDL 31.2 09/24/2017 0902   LDLCALC 133 (H) 12/21/2017 0921   LDLDIRECT 282.7 12/03/2012 1138    WEIGHTS: Wt Readings from Last 3 Encounters:  03/25/18 164 lb 12.8 oz (74.8 kg)  03/11/18 159 lb 6 oz (72.3 kg)  02/08/18 162 lb 14.4 oz (73.9 kg)    VITALS: BP 120/82   Pulse (!) 103   Ht 5\' 3"  (1.6 m)   Wt 164 lb 12.8 oz (74.8 kg)   BMI 29.19 kg/m   EXAM: Deferred  EKG: N/A  ASSESSMENT: 1. Extensive coronary artery calcification 2. Dyslipidemia not at goal LDL less than 70 3. Probable FH - Dutch score of  6 4. Atherogenic diet  PLAN: 1.   [name] [name] has had significant improvement in her lipid profile.  LDL cholesterol has declined from 229-133.  She could probably improve this further with twice daily Colestid.  She ready is also on high intensity rosuvastatin and ezetimibe.  We will not likely reach her goal LDL less than 70 without additional  therapy such as PCSK9 inhibitor.  Repeat labs today and will discuss going forward with PCSK9 inhibitor.  Pixie Casino, MD, [facility], [place] Director of the Advanced Lipid Disorders &  Cardiovascular Risk Reduction Clinic Diplomate of the American Board of Clinical Lipidology Attending Cardiologist  Direct Dial: [phone]  Fax: [phone]  Website:  [email or web address] 03/25/2018, 10:19 AM

## 2018-03-26 LAB — LIPID PANEL
CHOL/HDL RATIO: 2.9 ratio (ref 0.0–4.4)
Cholesterol, Total: 242 mg/dL — ABNORMAL HIGH (ref 100–199)
HDL: 84 mg/dL (ref >39)
LDL Calculated: 129 mg/dL — ABNORMAL HIGH (ref 0–99)
TRIGLYCERIDES: 143 mg/dL (ref 0–149)
VLDL Cholesterol Cal: 29 mg/dL (ref 5–40)

## 2018-03-30 ENCOUNTER — Telehealth: Payer: Self-pay | Admitting: Internal Medicine

## 2018-03-30 NOTE — Telephone Encounter (Signed)
Returned call to patient, advised unsure who called but lab results have been reviewed :   Notes recorded by Pixie Casino, MD on 03/30/2018 at 10:28 AM EDT Labs essentially unchanged compared to prior 3 months ago. Proceed with twice daily Colestid. Recommend starting PCSK9i.   Dr. Lemmie Evens    Patient aware and verbalized understanding,  Advised will make primary nurse aware to initiate process for Repatha.

## 2018-03-30 NOTE — Telephone Encounter (Signed)
Follow Up:    Pt is returning a call from today,but she does not know who called.

## 2018-03-31 DIAGNOSIS — Z1283 Encounter for screening for malignant neoplasm of skin: Secondary | ICD-10-CM | POA: Diagnosis not present

## 2018-03-31 DIAGNOSIS — D692 Other nonthrombocytopenic purpura: Secondary | ICD-10-CM | POA: Diagnosis not present

## 2018-03-31 DIAGNOSIS — L820 Inflamed seborrheic keratosis: Secondary | ICD-10-CM | POA: Diagnosis not present

## 2018-03-31 DIAGNOSIS — Z85820 Personal history of malignant melanoma of skin: Secondary | ICD-10-CM | POA: Diagnosis not present

## 2018-03-31 DIAGNOSIS — Z08 Encounter for follow-up examination after completed treatment for malignant neoplasm: Secondary | ICD-10-CM | POA: Diagnosis not present

## 2018-03-31 DIAGNOSIS — L570 Actinic keratosis: Secondary | ICD-10-CM | POA: Diagnosis not present

## 2018-03-31 DIAGNOSIS — L821 Other seborrheic keratosis: Secondary | ICD-10-CM | POA: Diagnosis not present

## 2018-04-01 ENCOUNTER — Telehealth: Payer: Self-pay | Admitting: Internal Medicine

## 2018-04-01 NOTE — Telephone Encounter (Signed)
PA for Repatha [employer] submitted via [email or web address] Key: [id number]

## 2018-04-02 NOTE — Telephone Encounter (Signed)
Received notice from [employer] that Repatha is not on formulary. The plan will not cover the drug unless patient has tried and failed covered drugs on formulary such as 2 of the following: - vytorin - lovastatin - pravastatin - simvastatin - Praluent   She is currently on crestor 40mg  + zetia 10mg  + colestid BID. She has also taken atorvastatin 80mg  in the past.   Appears that Praluent is preferred over [place] with her drug plan. Can attempt prior auth for this PCSK9. Will route to MD for dose recommendation.

## 2018-04-07 NOTE — Telephone Encounter (Signed)
Looks like a good candidate for the Vesalius study - she can get meds for free - probable FH with high CAC score, female >age 65. Will include Camila Li on this message. If she is not a candidate, than would pursue Praluent 150 mg q2weeks.  Dr. Debara Pickett

## 2018-04-13 NOTE — Telephone Encounter (Signed)
Spoke with patient who states she is having very bad constipation on the colestipol and is only taking it once daily d/t this.   She is interested in being considered for the Vesalius study.

## 2018-04-16 NOTE — Telephone Encounter (Signed)
Darn! Missed that .Marland Kitchen Thanks.

## 2018-04-23 ENCOUNTER — Other Ambulatory Visit: Payer: Self-pay | Admitting: Obstetrics and Gynecology

## 2018-04-23 DIAGNOSIS — Z1231 Encounter for screening mammogram for malignant neoplasm of breast: Secondary | ICD-10-CM

## 2018-04-27 ENCOUNTER — Other Ambulatory Visit: Payer: Self-pay | Admitting: Internal Medicine

## 2018-05-28 ENCOUNTER — Ambulatory Visit: Payer: PRIVATE HEALTH INSURANCE

## 2018-06-11 ENCOUNTER — Ambulatory Visit
Admission: RE | Admit: 2018-06-11 | Discharge: 2018-06-11 | Disposition: A | Discharge status: Home / Self Care | Payer: Medicare Other | Source: Ambulatory Visit | Attending: Obstetrics and Gynecology | Admitting: Obstetrics and Gynecology

## 2018-06-11 DIAGNOSIS — Z1231 Encounter for screening mammogram for malignant neoplasm of breast: Secondary | ICD-10-CM | POA: Diagnosis not present

## 2018-06-23 ENCOUNTER — Encounter: Payer: Self-pay | Admitting: Internal Medicine

## 2018-07-01 ENCOUNTER — Encounter: Payer: Self-pay | Admitting: Internal Medicine

## 2018-07-01 ENCOUNTER — Ambulatory Visit (INDEPENDENT_AMBULATORY_CARE_PROVIDER_SITE_OTHER): Payer: Medicare Other | Admitting: Internal Medicine

## 2018-07-01 VITALS — BP 158/96 | HR 97 | Ht 63.0 in | Wt 163.0 lb

## 2018-07-01 DIAGNOSIS — I2510 Atherosclerotic heart disease of native coronary artery without angina pectoris: Secondary | ICD-10-CM

## 2018-07-01 DIAGNOSIS — E7849 Other hyperlipidemia: Secondary | ICD-10-CM

## 2018-07-01 DIAGNOSIS — R931 Abnormal findings on diagnostic imaging of heart and coronary circulation: Secondary | ICD-10-CM

## 2018-07-01 NOTE — Progress Notes (Signed)
Chief Complaint:  Follow-up dyslipidemia  Primary Care Physician: Colon Branch, MD  HPI:  [name] [name] is a 67 y.o. female who is being seen today for the evaluation of dyslipidemia and statin intolerance at the request of Colon Branch, MD.  [name] [name] is a pleasant 67 year old female with an unfortunate history of lung cancer in the past status post radiation therapy.  She also has hypertension and marked dyslipidemia.  She is currently treated on rosuvastatin 40 mg and ezetimibe 10 mg daily.  Her last lipid profile from January 2019 indicated total cholesterol 334, triglycerides 156, HDL 73 and LDL at 229.  While she does have a high HDL, I suspect that it is likely nonfunctional.  This is actually an atherogenic lipid profile.  In fact, a chest CT scan for follow-up of her lung malignancy and June 2018 demonstrated "dense coronary artery vascular calcifications".  This could be also related to radiation injury to the coronaries.  She does have a history of stroke in her father and A. fib in her mother.  She is not aware of her sister has any cardiovascular disease and she does have 2 children.  Neither of them have been screened for elevated cholesterol.  She also reports eating a very atherogenic diet.  She eats 1-2 eggs several days a week, shrimp, and chicken livers, amongst other foods.  She reports recently having problems with the diarrhea and saw Dr. Henrene Pastor, who prescribed her for Colestid.  This was for the tablets, but she reports also a history of difficulty swallowing tablets.  Based on the size of these tablets, she is not taking the medication.  03/25/2018  [name] [name] returns today for follow-up.  She is done very well at lowering her cholesterol.  She is made significant dietary changes and has been taking Colestid once daily (not twice daily) in addition to rosuvastatin 40 mg.  She is also on Zetia 10 mg daily.  Her LDL cholesterol has gone down from 229-133.  She  still not at goal given her extensive atherosclerosis of LDL less than 70.  Her last lipid profile was in April and will need to repeat that today.  07/01/2018  [name] [name] returns today for follow-up of dyslipidemia.  She is tolerating rosuvastatin 40 mg daily as well as ezetimibe 10 mg daily.  Unfortunately she has multivessel coronary artery calcification.  Her goal LDL is less than 70.  Most recently she had a lipid profile 3 months ago which showed total cholesterol 242, triglycerides 143, HDL 84 and LDL 129.  As mentioned she is a good candidate for PCSK9 inhibitor however when we applied for Repatha her insurance denied it.  It was recommended that Praluent may be the preferred agent therefore will go ahead and pursue that.  She would not be a candidate for the upcoming Vesalius trial.  PMHx:  Past Medical History:  Diagnosis Date  . Anxiety   . Bilateral lower extremity pain   . Cholelithiasis   . Colon polyps    adenomatous  . High cholesterol   . Hx of radiation therapy 10/07/11 to 11/20/11   right lung  . Hx of radiation therapy 01/07/2012- 01/21/12   cranial irradiation  . Hypertension   . Lung cancer ([place]) 09/19/11   Stage III currently in remission  . Malignant melanoma of skin of canthus of right eye ([place]) 02/2017  . Osteoporosis 04/2014   Dexa scan by Dr. Corinna Capra, T-score -2.7, next in 2  years, with at least 13 % major fracture in 10 years  . Pitting edema    Bilateral lower extremities, duplex ultrasound 04/03/2015 normal, no DVT    Past Surgical History:  Procedure Laterality Date  . CHOLECYSTECTOMY  04/2011   biliary stent placement  . [place]  2015  . MOHS SURGERY Right 02/2017   cheek and eyelid  . Josph Macho Touch     Vaginal procedure  . TUBAL LIGATION      FAMHx:  Family History  Problem Relation Age of Onset  . Bladder Cancer Father   . Stroke Father   . Colon polyps Father   . Atrial fibrillation Mother   . Heart disease Mother         CHF  . Clotting disorder Mother        warfarin  . Colon polyps Mother   . Colon cancer Maternal Aunt 36  . Colon polyps Sister   . Drug abuse Son   . Breast cancer Neg Hx     SOCHx:   reports that she quit smoking about 6 years ago. Her smoking use included cigarettes. She has never used smokeless tobacco. She reports that she drinks alcohol. She reports that she does not use drugs.  ALLERGIES:  Allergies  Allergen Reactions  . Codeine Nausea And Vomiting  . Penicillins Rash    ROS: Pertinent items noted in HPI and remainder of comprehensive ROS otherwise negative.  HOME MEDS: Current Outpatient Medications on File Prior to Visit  Medication Sig Dispense Refill  . ALPRAZolam (XANAX) 0.5 MG tablet Take 0.5 mg by mouth daily as needed for anxiety. Take 1-2 tablets by mouth daily as needed for anxiety    . aspirin EC 81 MG tablet Take 81 mg daily by mouth.    . Cholecalciferol (VITAMIN D3) 2000 UNITS capsule Take 4,000 Units by mouth daily.     . citalopram (CELEXA) 40 MG tablet Take 1 tablet (40 mg total) by mouth daily. 30 tablet 5  . colestipol (COLESTID) 5 g packet Take 5 g by mouth 2 (two) times daily. 60 each 12  . Cyanocobalamin 2500 MCG CHEW Chew 2 tablets by mouth daily.    Marland Kitchen ezetimibe (ZETIA) 10 MG tablet Take 1 tablet (10 mg total) by mouth daily. 30 tablet 5  . lisinopril (PRINIVIL,ZESTRIL) 20 MG tablet Take 1 tablet (20 mg total) by mouth daily. 90 tablet 1  . Loperamide HCl (IMODIUM PO) Take 3 mLs by mouth daily. Reported on 02/07/2016    . raloxifene (EVISTA) 60 MG tablet   12  . rosuvastatin (CRESTOR) 40 MG tablet Take 1 tablet (40 mg total) by mouth daily. 90 tablet 1   No current facility-administered medications on file prior to visit.     LABS/IMAGING: No results found for this or any previous visit (from the past 48 hour(s)). No results found.  LIPID PANEL:    Component Value Date/Time   CHOL 242 (H) 03/25/2018 1044   TRIG 143 03/25/2018 1044   HDL  84 03/25/2018 1044   CHOLHDL 2.9 03/25/2018 1044   CHOLHDL 5 09/24/2017 0902   VLDL 31.2 09/24/2017 0902   LDLCALC 129 (H) 03/25/2018 1044   LDLDIRECT 282.7 12/03/2012 1138    WEIGHTS: Wt Readings from Last 3 Encounters:  07/01/18 163 lb (73.9 kg)  03/25/18 164 lb 12.8 oz (74.8 kg)  03/11/18 159 lb 6 oz (72.3 kg)    VITALS: BP (!) 158/96   Pulse 97  Ht 5\' 3"  (1.6 m)   Wt 163 lb (73.9 kg)   BMI 28.87 kg/m   EXAM: Deferred  EKG: N/A  ASSESSMENT: 1. Extensive coronary artery calcification 2. Dyslipidemia not at goal LDL less than 70 3. Probable FH - Dutch score of 6 4. Atherogenic diet  PLAN: 1.   [name] [name] has had further reduction in LDL with strict dietary and medication adherence.  She is currently at 129 for LDL with a goal less than 70.  She has probable FH based on her high Dutch score of 6 and with known ASCVD is a candidate for PCSK9 inhibitor.  Is not clear why her insurance had declined Repatha other than it may be a preferred agent for Praluent.  We will go ahead and fill prior authorization for 150 mg twice monthly.  We did demonstrate the use of an autoinjector today in the office.  Although she is reluctant to start therapy, she said she would be able to do it.  Lane repeat lipid profile in about 3 months after starting therapy.  Follow-up with me afterwards.  Pixie Casino, MD, [facility], [place] Director of the Advanced Lipid Disorders &  Cardiovascular Risk Reduction Clinic Diplomate of the American Board of Clinical Lipidology Attending Cardiologist  Direct Dial: [phone]  Fax: [phone]  Website:  [email or web address] 07/01/2018, 11:43 AM

## 2018-07-01 NOTE — Patient Instructions (Signed)
Medication Instructions:  Dr. Debara Pickett recommends Praluent 150mg /mL (PCSK9). This is an injectable cholesterol medication. This medication will need prior approval with your insurance company, which we will work on. If the medication is not approved initially, we may need to do an appeal with your insurance. We will keep you updated on this process. This medication can be provided at some local pharmacies or be shipped to you from a specialty pharmacy.   If you need a refill on your cardiac medications before your next appointment, please call your pharmacy.   Lab work: LAB WORK TODAY  If you have labs (blood work) drawn today and your tests are completely normal, you will receive your results only by: Marland Kitchen MyChart Message (if you have MyChart) OR . A paper copy in the mail If you have any lab test that is abnormal or we need to change your treatment, we will call you to review the results.   Follow-Up: At [facility], you and your health needs are our priority.  As part of our continuing mission to provide you with exceptional heart care, we have created designated Provider Care Teams.  These Care Teams include your primary Cardiologist (physician) and Advanced Practice Providers (APPs -  Physician Assistants and Nurse Practitioners) who all work together to provide you with the care you need, when you need it. You will need a follow up appointment in 3 - 4 months with Dr. Debara Pickett ([place]) Please have lab work 1 week prior to this visit.

## 2018-07-02 LAB — LIPID PANEL
CHOLESTEROL TOTAL: 273 mg/dL — AB (ref 100–199)
Chol/HDL Ratio: 3.2 ratio (ref 0.0–4.4)
HDL: 86 mg/dL (ref >39)
LDL Calculated: 159 mg/dL — ABNORMAL HIGH (ref 0–99)
TRIGLYCERIDES: 140 mg/dL (ref 0–149)
VLDL Cholesterol Cal: 28 mg/dL (ref 5–40)

## 2018-07-06 ENCOUNTER — Telehealth: Payer: Self-pay | Admitting: Internal Medicine

## 2018-07-06 DIAGNOSIS — L821 Other seborrheic keratosis: Secondary | ICD-10-CM | POA: Diagnosis not present

## 2018-07-06 DIAGNOSIS — Z85820 Personal history of malignant melanoma of skin: Secondary | ICD-10-CM | POA: Diagnosis not present

## 2018-07-06 DIAGNOSIS — Z08 Encounter for follow-up examination after completed treatment for malignant neoplasm: Secondary | ICD-10-CM | POA: Diagnosis not present

## 2018-07-06 DIAGNOSIS — Z1283 Encounter for screening for malignant neoplasm of skin: Secondary | ICD-10-CM | POA: Diagnosis not present

## 2018-07-06 NOTE — Telephone Encounter (Signed)
PA for Praluent 150mg /mL submitted via [email or web address] (Key: [id number])

## 2018-07-07 ENCOUNTER — Encounter: Payer: Self-pay | Admitting: Internal Medicine

## 2018-07-09 NOTE — Telephone Encounter (Signed)
Faxed additional information for [employer] prior auth to [phone]  Dx: HeFH Lipid profile on crestor 40 & zetia 10 - total cholesterol 334, LDL 229 Multivessel coronary artery calcifications Dutch score = 6 Goal LDL under 70

## 2018-07-12 NOTE — Telephone Encounter (Addendum)
Spoke with representative at [employer] and was informed that additional info faxed on 11/15 was not received and patient has been denied Praluent. Medicare only allows 1 denial thus an appeal will need to be submitted.   Call to be made to [facility] @ [phone]. When this number was called, was informed that patient does not have active coverage, thus was transferred to [employer] and given a different # to call ([phone]) for pharmacy clinical appeals but was given no option to speak with a representative.   An appeals letter to be composed and sent to [place] Department @ [phone].

## 2018-07-13 ENCOUNTER — Encounter: Payer: Self-pay | Admitting: Internal Medicine

## 2018-07-13 ENCOUNTER — Ambulatory Visit (INDEPENDENT_AMBULATORY_CARE_PROVIDER_SITE_OTHER): Payer: Medicare Other | Admitting: Internal Medicine

## 2018-07-13 VITALS — BP 126/72 | HR 103 | Temp 98.0°F | Resp 16 | Ht 63.0 in | Wt 163.0 lb

## 2018-07-13 DIAGNOSIS — I2510 Atherosclerotic heart disease of native coronary artery without angina pectoris: Secondary | ICD-10-CM

## 2018-07-13 DIAGNOSIS — I10 Essential (primary) hypertension: Secondary | ICD-10-CM

## 2018-07-13 DIAGNOSIS — Z85118 Personal history of other malignant neoplasm of bronchus and lung: Secondary | ICD-10-CM

## 2018-07-13 DIAGNOSIS — R739 Hyperglycemia, unspecified: Secondary | ICD-10-CM | POA: Diagnosis not present

## 2018-07-13 DIAGNOSIS — E559 Vitamin D deficiency, unspecified: Secondary | ICD-10-CM | POA: Diagnosis not present

## 2018-07-13 DIAGNOSIS — M810 Age-related osteoporosis without current pathological fracture: Secondary | ICD-10-CM

## 2018-07-13 DIAGNOSIS — J449 Chronic obstructive pulmonary disease, unspecified: Secondary | ICD-10-CM | POA: Diagnosis not present

## 2018-07-13 DIAGNOSIS — E785 Hyperlipidemia, unspecified: Secondary | ICD-10-CM | POA: Diagnosis not present

## 2018-07-13 DIAGNOSIS — Z23 Encounter for immunization: Secondary | ICD-10-CM | POA: Diagnosis not present

## 2018-07-13 LAB — BASIC METABOLIC PANEL
BUN: 21 mg/dL (ref 6–23)
CO2: 26 mEq/L (ref 19–32)
Calcium: 10 mg/dL (ref 8.4–10.5)
Chloride: 100 mEq/L (ref 96–112)
Creatinine, Ser: 1.04 mg/dL (ref 0.40–1.20)
GFR: 56.45 mL/min — ABNORMAL LOW (ref >60.00)
GLUCOSE: 103 mg/dL — AB (ref 70–99)
POTASSIUM: 4.5 meq/L (ref 3.5–5.1)
SODIUM: 136 meq/L (ref 135–145)

## 2018-07-13 LAB — HEMOGLOBIN A1C: HEMOGLOBIN A1C: 5.6 % (ref 4.6–6.5)

## 2018-07-13 MED ORDER — ZOSTER VAC RECOMB ADJUVANTED 50 MCG/0.5ML IM SUSR: 0.5000 mL | Freq: Once | INTRAMUSCULAR | 1 refills | Status: AC

## 2018-07-13 NOTE — Progress Notes (Signed)
Pre visit review using our clinic review tool, if applicable. No additional management support is needed unless otherwise documented below in the visit note. 

## 2018-07-13 NOTE — Assessment & Plan Note (Signed)
HTN: Continue with lisinopril.  BP today is very good. Hyperlipidemia: On Crestor, Zetia, colestipol.  Taking only 1 colestipol daily due to constipation.  Cardiology is working on getting a PCSK9 for her  Depression, anxiety: Good compliance with citalopram, RF prn, takes Xanax very sporadically Vitamin D deficiency: On supplements, checking labs COPD: Essentially no symptoms H/o melanoma: Reports he sees dermatology regularly IBS: Chronic issue, due for a colonoscopy, encouraged to proceed.  Preventive care reviewed  RTC 1 year

## 2018-07-13 NOTE — Progress Notes (Signed)
Subjective:    Patient ID: [name] [name], female    DOB: 12-Aug-1951, 67 y.o.   MRN: [id number]  DOS:  07/13/2018 Type of visit - description : rov HTN:BP elevated @ cardiology but blood pressures are usually okay.  Good med compliance High cholesterol: Note for cardiology reviewed Anxiety, depression: Doing great emotionally. COPD: Essentially no symptoms.   Review of Systems  Denies chest pain, difficulty breathing No nausea or vomiting; has diarrhea: at baseline.  no major problems with cough or sputum production.  Past Medical History:  Diagnosis Date  . Anxiety   . Bilateral lower extremity pain   . Cholelithiasis   . Colon polyps    adenomatous  . High cholesterol   . Hx of radiation therapy 10/07/11 to 11/20/11   right lung  . Hx of radiation therapy 01/07/2012- 01/21/12   cranial irradiation  . Hypertension   . Lung cancer ([place]) 09/19/11   Stage III currently in remission  . Malignant melanoma of skin of canthus of right eye ([place]) 02/2017  . Osteoporosis 04/2014   Dexa scan by Dr. Corinna Capra, T-score -2.7, next in 2 years, with at least 13 % major fracture in 10 years  . Pitting edema    Bilateral lower extremities, duplex ultrasound 04/03/2015 normal, no DVT    Past Surgical History:  Procedure Laterality Date  . CHOLECYSTECTOMY  04/2011   biliary stent placement  . [place]  2015  . MOHS SURGERY Right 02/2017   cheek and eyelid  . Josph Macho Touch     Vaginal procedure  . TUBAL LIGATION      Social History   Socioeconomic History  . Marital status: Divorced    Spouse name: Not on file  . Number of children: 2  . Years of education: Not on file  . Highest education level: Not on file  Occupational History  . Occupation: retired, Lexicographer  . Financial resource strain: Not on file  . Food insecurity:    Worry: Not on file    Inability: Not on file  . Transportation needs:    Medical: Not on file    Non-medical: Not on  file  Tobacco Use  . Smoking status: Former Smoker    Types: Cigarettes    Last attempt to quit: 09/08/2011    Years since quitting: 6.8  . Smokeless tobacco: Never Used  Substance and Sexual Activity  . Alcohol use: Yes    Comment: occasional  . Drug use: No  . Sexual activity: Not on file  Lifestyle  . Physical activity:    Days per week: Not on file    Minutes per session: Not on file  . Stress: Not on file  Relationships  . Social connections:    Talks on phone: Not on file    Gets together: Not on file    Attends religious service: Not on file    Active member of club or organization: Not on file    Attends meetings of clubs or organizations: Not on file    Relationship status: Not on file  . Intimate partner violence:    Fear of current or ex partner: Not on file    Emotionally abused: Not on file    Physically abused: Not on file    Forced sexual activity: Not on file  Other Topics Concern  . Not on file  Social History Narrative   divorced, 2 kids, lives by herself, had a boyfriend  Allergies as of 07/13/2018      Reactions   Codeine Nausea And Vomiting   Penicillins Rash      Medication List        Accurate as of 07/13/18  6:28 PM. Always use your most recent med list.          ALPRAZolam 0.5 MG tablet Commonly known as:  XANAX Take 0.5 mg by mouth daily as needed for anxiety. Take 1-2 tablets by mouth daily as needed for anxiety   aspirin EC 81 MG tablet Take 81 mg daily by mouth.   citalopram 40 MG tablet Commonly known as:  CELEXA Take 1 tablet (40 mg total) by mouth daily.   colestipol 5 g packet Commonly known as:  COLESTID Take 5 g by mouth 2 (two) times daily.   Cyanocobalamin 2500 MCG Chew Chew 2 tablets by mouth daily.   ezetimibe 10 MG tablet Commonly known as:  ZETIA Take 1 tablet (10 mg total) by mouth daily.   IMODIUM PO Take 3 mLs by mouth daily. Reported on 02/07/2016   lisinopril 20 MG  tablet Commonly known as:  PRINIVIL,ZESTRIL Take 1 tablet (20 mg total) by mouth daily.   PRALUENT 150 MG/ML [place] Generic drug:  Alirocumab Inject 1 Dose into the skin every 14 (fourteen) days.   raloxifene 60 MG tablet Commonly known as:  EVISTA   rosuvastatin 40 MG tablet Commonly known as:  CRESTOR Take 1 tablet (40 mg total) by mouth daily.   Vitamin D3 50 MCG (2000 UT) capsule Take 4,000 Units by mouth daily.   Zoster Vaccine Adjuvanted injection Commonly known as:  SHINGRIX Inject 0.5 mLs into the muscle once for 1 dose.           Objective:   Physical Exam BP 126/72 (BP Location: Left Arm, Patient Position: Sitting, Cuff Size: Small)   Pulse (!) 103   Temp 98 F (36.7 C) (Oral)   Resp 16   Ht 5\' 3"  (1.6 m)   Wt 163 lb (73.9 kg) Comment: per Pt  SpO2 96%   BMI 28.87 kg/m  General: Well developed, NAD, BMI noted Neck: No  thyromegaly  HEENT:  Normocephalic . Face symmetric, atraumatic Lungs:  CTA B Normal respiratory effort, no intercostal retractions, no accessory muscle use. Heart: RRR,  no murmur.  No pretibial edema bilaterally  Abdomen:  Not distended, soft, non-tender. No rebound or rigidity.   Skin: Exposed areas without rash. Not pale. Not jaundice Neurologic:  alert & oriented X3.  Speech normal, gait appropriate for age and unassisted Strength symmetric and appropriate for age.  Psych: Cognition and judgment appear intact.  Cooperative with normal attention span and concentration.  Behavior appropriate. No anxious or depressed appearing.     Assessment & Plan:     Assessment   HTN Hyperlipidemia -- cramps with Lipitor dc 2015 Depression . Anxiety -- on citalopram, rarely uses Xanax Osteoporosis-- per DEXA at gyn 05-10-2014 --->  was on boniva, now evista  B12 deficiency dx 01/2014 Vit D  def  COPD: Mild last PFTs  Oncology: -Lung cancer SCC DX 2013, XRT chest and brain -melanoma 2018, R face, s/p Mohs Lower extremity edema [place]  (-) for DVT 03-2015 Coronary calcifications per CT chest 6 2016 IBS, diarrhea predominant   on Imodium as needed; GI 2015-- rx questran, self d/c d/t constipation ASA intolerant (tinnitus) but ok  taking low-dose  PLAN: HTN: Continue with lisinopril.  BP today is very good. Hyperlipidemia: On  Crestor, Zetia, colestipol.  Taking only 1 colestipol daily due to constipation.  Cardiology is working on getting a PCSK9 for her  Depression, anxiety: Good compliance with citalopram, RF prn, takes Xanax very sporadically Vitamin D deficiency: On supplements, checking labs COPD: Essentially no symptoms H/o melanoma: Reports he sees dermatology regularly IBS: Chronic issue, due for a colonoscopy, encouraged to proceed.  Preventive care reviewed  RTC 1 year

## 2018-07-13 NOTE — Assessment & Plan Note (Addendum)
Preventive care reviewed -Td shot 2019, s/p zostavax ; pnm 23 2003 and 06-2017; prevnar 2016; flu shot today.  Shingrix prescription provided -Sees gyn: they take care of osteoporosis-[facility]  -CCS: colonoscopy in 2007 @ [facility], 2 polyps Colonoscopy 05-2013 @ [facility], + polyps, due for a colonoscopy, patient aware, she did get a letter from GI

## 2018-07-13 NOTE — Patient Instructions (Signed)
GO TO THE LAB : Get the blood work     GO TO THE FRONT DESK Schedule your next appointment for a checkup in 1 year    Check the  blood pressure 2 or 3 times a month   Be sure your blood pressure is between 110/65 and  135/85. If it is consistently higher or lower, let me know

## 2018-07-15 NOTE — Telephone Encounter (Signed)
Appeals letter, denial letter, MD notes, lab results faxed to [phone]

## 2018-07-15 NOTE — Telephone Encounter (Signed)
LM for patient with update on status of Repatha

## 2018-07-16 LAB — VITAMIN D 1,25 DIHYDROXY
VITAMIN D 1, 25 (OH) TOTAL: 61 pg/mL (ref 18–72)
Vitamin D3 1, 25 (OH)2: 61 pg/mL

## 2018-07-19 ENCOUNTER — Other Ambulatory Visit: Payer: Self-pay | Admitting: Internal Medicine

## 2018-07-19 DIAGNOSIS — E785 Hyperlipidemia, unspecified: Secondary | ICD-10-CM

## 2018-07-20 NOTE — Telephone Encounter (Signed)
Received notice from [facility]) that patient has been approved for Praluent 150mg /mL from July 06, 2018 - Jan 14, 2019  Authorization #: [id number]

## 2018-07-20 NOTE — Telephone Encounter (Signed)
Patient called w/update on Praluent. She would like to pursue patient assistance for 2020 calendar year. Application mailed and she was advised to complete and return to our office.

## 2018-08-02 DIAGNOSIS — H52223 Regular astigmatism, bilateral: Secondary | ICD-10-CM | POA: Diagnosis not present

## 2018-08-02 DIAGNOSIS — H43812 Vitreous degeneration, left eye: Secondary | ICD-10-CM | POA: Diagnosis not present

## 2018-08-02 DIAGNOSIS — H524 Presbyopia: Secondary | ICD-10-CM | POA: Diagnosis not present

## 2018-08-02 DIAGNOSIS — H10413 Chronic giant papillary conjunctivitis, bilateral: Secondary | ICD-10-CM | POA: Diagnosis not present

## 2018-08-02 DIAGNOSIS — H2513 Age-related nuclear cataract, bilateral: Secondary | ICD-10-CM | POA: Diagnosis not present

## 2018-08-02 DIAGNOSIS — H5203 Hypermetropia, bilateral: Secondary | ICD-10-CM | POA: Diagnosis not present

## 2018-08-10 NOTE — Telephone Encounter (Signed)
Spoke with patient about PASS patient assistance application. This has been received. She plans to work on this soon. Advised that PASS will not process applications until Aug 25, 2018 but the sooner we get it submitted the better.

## 2018-08-28 ENCOUNTER — Other Ambulatory Visit: Payer: Self-pay | Admitting: Internal Medicine

## 2018-09-03 NOTE — Telephone Encounter (Signed)
LM for patient to call back in regards to patient assistance application received yesterday  On PASS application, patient needs to answer: -- "what is your total household income" -- Environmental consultant including  - primary insurance name, policy ID #, group #, phone #  - secondary insurance name, policy ID #, group #, phone #  - Rx drug insurance (if different from medical)    - insurance name, ID #, BIN, Rx Grp, PCN, phone #

## 2018-09-03 NOTE — Telephone Encounter (Signed)
Patient returned call. She provided the necessary info for application to be faxed.

## 2018-09-06 NOTE — Telephone Encounter (Signed)
Faxed patient assistance for Praluent to PASS @ [phone]

## 2018-09-17 NOTE — Telephone Encounter (Signed)
Called PASS to check on status of patient assistance application. Was notified that form changed in 2020 and patient will need to complete new form.

## 2018-09-17 NOTE — Telephone Encounter (Signed)
Spoke with patient about status of PASS praluent patient assistance. She would like new application mailed to her so she can sign and return and our office submit on her behalf. Her 2/18 lipid clinic appt has been moved to 4/16 @ 3pm (non-lipid day but patient prefers Thurs/Fri in afternoon). She was advised to have fasting labs 3-5 days prior to appointment

## 2018-09-18 ENCOUNTER — Other Ambulatory Visit: Payer: Self-pay | Admitting: Internal Medicine

## 2018-09-18 DIAGNOSIS — E785 Hyperlipidemia, unspecified: Secondary | ICD-10-CM

## 2018-09-27 ENCOUNTER — Telehealth: Payer: Self-pay | Admitting: Internal Medicine

## 2018-09-27 NOTE — Telephone Encounter (Signed)
Faxed completed patient assistance application for Praluent to PASS @ [phone]

## 2018-09-27 NOTE — Telephone Encounter (Signed)
Patient notified that application was faxed today.

## 2018-10-01 ENCOUNTER — Telehealth: Payer: Self-pay

## 2018-10-01 NOTE — Telephone Encounter (Signed)
Called to let pt know that they were denied from PASS but I gave them the # to the [employer] to try

## 2018-10-12 ENCOUNTER — Ambulatory Visit: Payer: PRIVATE HEALTH INSURANCE | Admitting: Internal Medicine

## 2018-11-02 DIAGNOSIS — Z08 Encounter for follow-up examination after completed treatment for malignant neoplasm: Secondary | ICD-10-CM | POA: Diagnosis not present

## 2018-11-02 DIAGNOSIS — D225 Melanocytic nevi of trunk: Secondary | ICD-10-CM | POA: Diagnosis not present

## 2018-11-02 DIAGNOSIS — Z1283 Encounter for screening for malignant neoplasm of skin: Secondary | ICD-10-CM | POA: Diagnosis not present

## 2018-11-02 DIAGNOSIS — Z85820 Personal history of malignant melanoma of skin: Secondary | ICD-10-CM | POA: Diagnosis not present

## 2018-12-02 ENCOUNTER — Telehealth: Payer: Self-pay

## 2018-12-02 NOTE — Telephone Encounter (Signed)
Virtual Visit Pre-Appointment Phone Call  Steps For Call:  1. Confirm consent - "In the setting of the current Covid19 crisis, you are scheduled for a (phone or video) visit with your provider on (date) at (time).  Just as we do with many in-office visits, in order for you to participate in this visit, we must obtain consent.  If you'd like, I can send this to your mychart (if signed up) or email for you to review.  Otherwise, I can obtain your verbal consent now.  All virtual visits are billed to your insurance company just like a normal visit would be.  By agreeing to a virtual visit, we'd like you to understand that the technology does not allow for your provider to perform an examination, and thus may limit your provider's ability to fully assess your condition.  Finally, though the technology is pretty good, we cannot assure that it will always work on either your or our end, and in the setting of a video visit, we may have to convert it to a phone-only visit.  In either situation, we cannot ensure that we have a secure connection.  Are you willing to proceed?"  2. Give patient instructions for WebEx download to smartphone as below if video visit  3. Advise patient to be prepared with any vital sign or heart rhythm information, their current medicines, and a piece of paper and pen handy for any instructions they may receive the day of their visit  4. Inform patient they will receive a phone call 15 minutes prior to their appointment time (may be from unknown caller ID) so they should be prepared to answer  5. Confirm that appointment type is correct in Epic appointment notes (video vs telephone)    TELEPHONE CALL NOTE  [name] [name] has been deemed a candidate for a follow-up tele-health visit to limit community exposure during the Covid-19 pandemic. I spoke with the patient via phone to ensure availability of phone/video source, confirm preferred email & phone number, and  discuss instructions and expectations.  I reminded [name] [name] to be prepared with any vital sign and/or heart rhythm information that could potentially be obtained via home monitoring, at the time of her visit. I reminded [name] [name] to expect a phone call at the time of her visit if her visit.  Did the patient verbally acknowledge consent to treatment? Patient provided verbal consent.   [name] [name], [place] 12/02/2018 3:03 PM   DOWNLOADING THE [place]  - If Apple, go to [employer] and type in WebEx in the search bar. [place] [employer], the blue/green circle. The app is free but as with any other app downloads, their phone may require them to verify saved payment information or Apple password. The patient does NOT have to create an account.  - If Android, ask patient to go to [employer] and type in WebEx in the search bar. [place] [employer], the blue/green circle. The app is free but as with any other app downloads, their phone may require them to verify saved payment information or Android password. The patient does NOT have to create an account.   CONSENT FOR TELE-HEALTH VISIT - PLEASE REVIEW  I hereby voluntarily request, consent and authorize [facility] and its employed or contracted physicians, physician assistants, nurse practitioners or other licensed health care professionals (the Practitioner), to provide me with telemedicine health care services (the "Services") as deemed necessary  by the treating Practitioner. I acknowledge and consent to receive the Services by the Practitioner via telemedicine. I understand that the telemedicine visit will involve communicating with the Practitioner through live audiovisual communication technology and the disclosure of certain medical information by electronic transmission. I acknowledge that I have been given the opportunity to request an in-person assessment or other  available alternative prior to the telemedicine visit and am voluntarily participating in the telemedicine visit.  I understand that I have the right to withhold or withdraw my consent to the use of telemedicine in the course of my care at any time, without affecting my right to future care or treatment, and that the Practitioner or I may terminate the telemedicine visit at any time. I understand that I have the right to inspect all information obtained and/or recorded in the course of the telemedicine visit and may receive copies of available information for a reasonable fee.  I understand that some of the potential risks of receiving the Services via telemedicine include:  Marland Kitchen Delay or interruption in medical evaluation due to technological equipment failure or disruption; . Information transmitted may not be sufficient (e.g. poor resolution of images) to allow for appropriate medical decision making by the Practitioner; and/or  . In rare instances, security protocols could fail, causing a breach of personal health information.  Furthermore, I acknowledge that it is my responsibility to provide information about my medical history, conditions and care that is complete and accurate to the best of my ability. I acknowledge that Practitioner's advice, recommendations, and/or decision may be based on factors not within their control, such as incomplete or inaccurate data provided by me or distortions of diagnostic images or specimens that may result from electronic transmissions. I understand that the practice of medicine is not an exact science and that Practitioner makes no warranties or guarantees regarding treatment outcomes. I acknowledge that I will receive a copy of this consent concurrently upon execution via email to the email address I last provided but may also request a printed copy by calling the office of [place].    I understand that my insurance will be billed for this visit.   I have  read or had this consent read to me. . I understand the contents of this consent, which adequately explains the benefits and risks of the Services being provided via telemedicine.  . I have been provided ample opportunity to ask questions regarding this consent and the Services and have had my questions answered to my satisfaction. . I give my informed consent for the services to be provided through the use of telemedicine in my medical care  By participating in this telemedicine visit I agree to the above.

## 2018-12-03 ENCOUNTER — Telehealth: Payer: Self-pay | Admitting: Internal Medicine

## 2018-12-03 NOTE — Telephone Encounter (Signed)
Smart phone/declined mychart/pre reg complete/04.10.2020

## 2018-12-05 ENCOUNTER — Other Ambulatory Visit: Payer: Self-pay | Admitting: Internal Medicine

## 2018-12-06 ENCOUNTER — Encounter: Payer: Self-pay | Admitting: Internal Medicine

## 2018-12-06 ENCOUNTER — Telehealth (INDEPENDENT_AMBULATORY_CARE_PROVIDER_SITE_OTHER): Payer: Medicare Other | Admitting: Internal Medicine

## 2018-12-06 VITALS — Ht 63.0 in | Wt 163.1 lb

## 2018-12-06 DIAGNOSIS — R931 Abnormal findings on diagnostic imaging of heart and coronary circulation: Secondary | ICD-10-CM

## 2018-12-06 DIAGNOSIS — Z7189 Other specified counseling: Secondary | ICD-10-CM | POA: Diagnosis not present

## 2018-12-06 DIAGNOSIS — E785 Hyperlipidemia, unspecified: Secondary | ICD-10-CM | POA: Diagnosis not present

## 2018-12-06 DIAGNOSIS — E7849 Other hyperlipidemia: Secondary | ICD-10-CM

## 2018-12-06 DIAGNOSIS — I2510 Atherosclerotic heart disease of native coronary artery without angina pectoris: Secondary | ICD-10-CM | POA: Diagnosis not present

## 2018-12-06 MED ORDER — PRALUENT 150 MG/ML ~~LOC~~ SOAJ: 1.0000 | SUBCUTANEOUS | 0 refills | Status: DC

## 2018-12-06 NOTE — Progress Notes (Signed)
Virtual Visit via Telephone Note   This visit type was conducted due to national recommendations for restrictions regarding the COVID-19 Pandemic (e.g. social distancing) in an effort to limit this patient's exposure and mitigate transmission in our community.  Due to her co-morbid illnesses, this patient is at least at moderate risk for complications without adequate follow up.  This format is felt to be most appropriate for this patient at this time.  The patient did not have access to video technology/had technical difficulties with video requiring transitioning to audio format only (telephone).  All issues noted in this document were discussed and addressed.  No physical exam could be performed with this format.  Please refer to the patient's chart for her  consent to telehealth for [facility]   Evaluation Performed:  Telephone follow-up  Date:  12/06/2018   ID:  [name] [name], DOB 11/10/1950, MRN [id number]  Patient Location:  [place] [address]  Provider location:   [address] [place], [place] [address]  PCP:  Colon Branch, MD  Cardiologist:  No primary care provider on file. Electrophysiologist:  None   Chief Complaint:  No complaints  History of Present Illness:    [name] [name] is a 68 y.o. female who presents via audio/video conferencing for a telehealth visit today.  [name] [name] was seen today in telephone follow-up.  Unfortunately she has not started taking Praluent.  We did receive prior authorization for this in November for 6 months with an expiration 01/14/2019 (Authorization #: [id number]).  She did also apply for the PASS program, but was denied for income reasons.  Therefore she did not pursue a prescription.  Her lipid profile and known coronary artery disease would qualify her for Praluent.  She has not had repeat lipid testing.  Otherwise she is asymptomatic, denying any shortness of breath or chest  pain.   The patient does not have symptoms concerning for COVID-19 infection (fever, chills, cough, or new SHORTNESS OF BREATH).    Prior CV studies:   The following studies were reviewed today:  Lipid profile  PMHx:  Past Medical History:  Diagnosis Date   Anxiety    Bilateral lower extremity pain    Cholelithiasis    Colon polyps    adenomatous   High cholesterol    Hx of radiation therapy 10/07/11 to 11/20/11   right lung   Hx of radiation therapy 01/07/2012- 01/21/12   cranial irradiation   Hypertension    Lung cancer ([place]) 09/19/11   Stage III currently in remission   Malignant melanoma of skin of canthus of right eye ([place]) 02/2017   Osteoporosis 04/2014   Dexa scan by Dr. Corinna Capra, T-score -2.7, next in 2 years, with at least 13 % major fracture in 10 years   Pitting edema    Bilateral lower extremities, duplex ultrasound 04/03/2015 normal, no DVT    Past Surgical History:  Procedure Laterality Date   CHOLECYSTECTOMY  04/2011   biliary stent placement   INCISIONAL HERNIA REPAIR  2015   MOHS SURGERY Right 02/2017   cheek and eyelid   Josph Macho Touch     Vaginal procedure   TUBAL LIGATION      FAMHx:  Family History  Problem Relation Age of Onset   Bladder Cancer Father    Stroke Father    Colon polyps Father    Atrial fibrillation Mother    Heart disease Mother  CHF   Clotting disorder Mother        warfarin   Colon polyps Mother    Colon cancer Maternal Aunt 87   Colon polyps Sister    Drug abuse Son    Breast cancer Neg Hx     SOCHx:   reports that she quit smoking about 7 years ago. Her smoking use included cigarettes. She has never used smokeless tobacco. She reports current alcohol use. She reports that she does not use drugs.  ALLERGIES:  Allergies  Allergen Reactions   Codeine Nausea And Vomiting   Penicillins Rash    MEDS:  Current Meds  Medication Sig   ALPRAZolam (XANAX) 0.5 MG tablet Take 0.5 mg by  mouth daily as needed for anxiety. Take 1-2 tablets by mouth daily as needed for anxiety   aspirin EC 81 MG tablet Take 81 mg daily by mouth.   Cholecalciferol (VITAMIN D3) 2000 UNITS capsule Take 4,000 Units by mouth daily.    citalopram (CELEXA) 40 MG tablet Take 1 tablet (40 mg total) by mouth daily.   Cyanocobalamin 2500 MCG CHEW Chew 2 tablets by mouth daily.   ezetimibe (ZETIA) 10 MG tablet Take 1 tablet (10 mg total) by mouth daily.   lisinopril (PRINIVIL,ZESTRIL) 20 MG tablet Take 1 tablet (20 mg total) by mouth daily.   Loperamide HCl (IMODIUM PO) Take 3 mLs by mouth daily as needed. Reported on 02/07/2016   raloxifene (EVISTA) 60 MG tablet Take 60 mg by mouth daily.   rosuvastatin (CRESTOR) 40 MG tablet Take 1 tablet (40 mg total) by mouth daily.     ROS: Pertinent items noted in HPI and remainder of comprehensive ROS otherwise negative.  Labs/Other Tests and Data Reviewed:    Recent Labs: 02/05/2018: ALT 14; Hemoglobin 13.1; Platelets 222 07/13/2018: BUN 21; Creatinine, Ser 1.04; Potassium 4.5; Sodium 136   Recent Lipid Panel Lab Results  Component Value Date/Time   CHOL 273 (H) 07/01/2018 12:12 PM   TRIG 140 07/01/2018 12:12 PM   HDL 86 07/01/2018 12:12 PM   CHOLHDL 3.2 07/01/2018 12:12 PM   CHOLHDL 5 09/24/2017 09:02 AM   LDLCALC 159 (H) 07/01/2018 12:12 PM   LDLDIRECT 282.7 12/03/2012 11:38 AM    Wt Readings from Last 3 Encounters:  12/06/18 163 lb 1.6 oz (74 kg)  07/13/18 163 lb (73.9 kg)  07/01/18 163 lb (73.9 kg)     Exam:    Vital Signs:  Ht 5\' 3"  (1.6 m)    Wt 163 lb 1.6 oz (74 kg)    BMI 28.89 kg/m    No exam due to telephone visit  ASSESSMENT & PLAN:    1. Familial hyperlipidemia 2. High coronary artery calcium score 3. Multivessel CAD  [name] [name] is a good candidate for PCSK9 inhibitor and was approved for Praluent.  She never got the prescription as we are waiting on the patient assistance program.  Unfortunately she did not  qualify due to income.  Nonetheless she has not taken her medication.  She does have approval through May 22.  I would like to get a prescription out and have her release get 2 doses.  We could repeat a lipid profile in a month.  If her lipid profile is improved then I would ask for repeat prior authorization to continue therapy indefinitely.  Follow-up with me in lipid clinic in 4 months.  COVID-19 Education: The signs and symptoms of COVID-19 were discussed with the patient and how to seek care  for testing (follow up with PCP or arrange E-visit).  The importance of social distancing was discussed today.  Patient Risk:   After full review of this patients clinical status, I feel that they are at least moderate risk at this time.  Time:   Today, I have spent 15 minutes with the patient with telehealth technology discussing lipid management.     Medication Adjustments/Labs and Tests Ordered: Current medicines are reviewed at length with the patient today.  Concerns regarding medicines are outlined above.   Tests Ordered: Orders Placed This Encounter  Procedures   Lipid panel    Medication Changes: Meds ordered this encounter  Medications   PRALUENT 150 MG/ML SOAJ    Sig: Inject 1 Dose into the skin every 14 (fourteen) days.    Dispense:  8 pen    Refill:  0    [id number]    Disposition:  in 4 month(s)  Pixie Casino, MD, [facility], [place] Director of the Advanced Lipid Disorders &  Cardiovascular Risk Reduction Clinic Diplomate of the American Board of Clinical Lipidology Attending Cardiologist  Direct Dial: [phone]   Fax: [phone]  Website:  [email or web address]  Pixie Casino, MD  12/06/2018 4:29 PM

## 2018-12-06 NOTE — Patient Instructions (Signed)
Medication Instructions:  Your physician recommends that you continue on your current medications as directed. Please refer to the Current Medication list given to you today.  START Praluent 150 mg--1 injection every 2 weeks.  If you need a refill on your cardiac medications before your next appointment, please call your pharmacy.   Lab work: Your physician recommends that you return for a FASTING lipid profile in 1 month.  If you have labs (blood work) drawn today and your tests are completely normal, you will receive your results only by: Marland Kitchen MyChart Message (if you have MyChart) OR . A paper copy in the mail If you have any lab test that is abnormal or we need to change your treatment, we will call you to review the results.   Follow-Up: At [facility], you and your health needs are our priority.  As part of our continuing mission to provide you with exceptional heart care, we have created designated Provider Care Teams.  These Care Teams include your primary Cardiologist (physician) and Advanced Practice Providers (APPs -  Physician Assistants and Nurse Practitioners) who all work together to provide you with the care you need, when you need it. . You will need a follow up appointment in 4 months.  Please call our office 2 months in advance to schedule this appointment.  You may see Pixie Casino, MD in [place].   Any Other Special Instructions Will Be Listed Below (If Applicable). None

## 2018-12-09 ENCOUNTER — Ambulatory Visit: Payer: Medicare Other | Admitting: Internal Medicine

## 2019-01-07 ENCOUNTER — Other Ambulatory Visit: Payer: Self-pay | Admitting: Internal Medicine

## 2019-01-07 DIAGNOSIS — E785 Hyperlipidemia, unspecified: Secondary | ICD-10-CM

## 2019-02-04 ENCOUNTER — Other Ambulatory Visit: Payer: PRIVATE HEALTH INSURANCE

## 2019-02-04 ENCOUNTER — Ambulatory Visit (HOSPITAL_COMMUNITY)
Admission: RE | Admit: 2019-02-04 | Discharge: 2019-02-04 | Disposition: A | Discharge status: Home / Self Care | Payer: Medicare Other | Source: Ambulatory Visit | Attending: Internal Medicine | Admitting: Internal Medicine

## 2019-02-04 ENCOUNTER — Inpatient Hospital Stay: Payer: Medicare Other | Attending: Internal Medicine

## 2019-02-04 ENCOUNTER — Other Ambulatory Visit: Payer: Self-pay

## 2019-02-04 DIAGNOSIS — C3490 Malignant neoplasm of unspecified part of unspecified bronchus or lung: Secondary | ICD-10-CM

## 2019-02-04 DIAGNOSIS — I700 Atherosclerosis of aorta: Secondary | ICD-10-CM | POA: Diagnosis not present

## 2019-02-04 DIAGNOSIS — K760 Fatty (change of) liver, not elsewhere classified: Secondary | ICD-10-CM | POA: Diagnosis not present

## 2019-02-04 DIAGNOSIS — Z923 Personal history of irradiation: Secondary | ICD-10-CM | POA: Insufficient documentation

## 2019-02-04 DIAGNOSIS — S2231XA Fracture of one rib, right side, initial encounter for closed fracture: Secondary | ICD-10-CM | POA: Diagnosis not present

## 2019-02-04 DIAGNOSIS — Z85118 Personal history of other malignant neoplasm of bronchus and lung: Secondary | ICD-10-CM | POA: Diagnosis not present

## 2019-02-04 DIAGNOSIS — S2232XA Fracture of one rib, left side, initial encounter for closed fracture: Secondary | ICD-10-CM | POA: Diagnosis not present

## 2019-02-04 DIAGNOSIS — Z9221 Personal history of antineoplastic chemotherapy: Secondary | ICD-10-CM | POA: Insufficient documentation

## 2019-02-04 DIAGNOSIS — J439 Emphysema, unspecified: Secondary | ICD-10-CM | POA: Diagnosis not present

## 2019-02-04 LAB — CBC WITH DIFFERENTIAL (CANCER CENTER ONLY)
Abs Immature Granulocytes: 0.02 10*3/uL (ref 0.00–0.07)
Basophils Absolute: 0.1 10*3/uL (ref 0.0–0.1)
Basophils Relative: 1 %
Eosinophils Absolute: 0.2 10*3/uL (ref 0.0–0.5)
Eosinophils Relative: 2 %
HCT: 38.7 % (ref 36.0–46.0)
Hemoglobin: 12.6 g/dL (ref 12.0–15.0)
Immature Granulocytes: 0 %
Lymphocytes Relative: 11 %
Lymphs Abs: 0.7 10*3/uL (ref 0.7–4.0)
MCH: 31 pg (ref 26.0–34.0)
MCHC: 32.6 g/dL (ref 30.0–36.0)
MCV: 95.1 fL (ref 80.0–100.0)
Monocytes Absolute: 0.5 10*3/uL (ref 0.1–1.0)
Monocytes Relative: 8 %
Neutro Abs: 5.3 10*3/uL (ref 1.7–7.7)
Neutrophils Relative %: 78 %
Platelet Count: 195 10*3/uL (ref 150–400)
RBC: 4.07 MIL/uL (ref 3.87–5.11)
RDW: 12.2 % (ref 11.5–15.5)
WBC Count: 6.8 10*3/uL (ref 4.0–10.5)
nRBC: 0 % (ref 0.0–0.2)

## 2019-02-04 LAB — CMP (CANCER CENTER ONLY)
ALT: 26 U/L (ref 0–44)
AST: 35 U/L (ref 15–41)
Albumin: 4.3 g/dL (ref 3.5–5.0)
Alkaline Phosphatase: 66 U/L (ref 38–126)
Anion gap: 13 (ref 5–15)
BUN: 16 mg/dL (ref 8–23)
CO2: 22 mmol/L (ref 22–32)
Calcium: 9.6 mg/dL (ref 8.9–10.3)
Chloride: 104 mmol/L (ref 98–111)
Creatinine: 1 mg/dL (ref 0.44–1.00)
GFR, Est AFR Am: >60 mL/min (ref >60)
GFR, Estimated: 59 mL/min — ABNORMAL LOW (ref >60)
Glucose, Bld: 83 mg/dL (ref 70–99)
Potassium: 4.3 mmol/L (ref 3.5–5.1)
Sodium: 139 mmol/L (ref 135–145)
Total Bilirubin: 0.4 mg/dL (ref 0.3–1.2)
Total Protein: 7.3 g/dL (ref 6.5–8.1)

## 2019-02-04 MED ORDER — IOHEXOL 300 MG/ML  SOLN
75.0000 mL | Freq: Once | INTRAMUSCULAR | Status: AC | PRN
  Administered 2019-02-04: 75 mL via INTRAVENOUS

## 2019-02-04 MED ORDER — SODIUM CHLORIDE (PF) 0.9 % IJ SOLN
INTRAMUSCULAR | Status: AC
  Filled 2019-02-04: qty 50

## 2019-02-07 ENCOUNTER — Other Ambulatory Visit: Payer: Self-pay

## 2019-02-07 ENCOUNTER — Encounter: Payer: Self-pay | Admitting: Internal Medicine

## 2019-02-07 ENCOUNTER — Inpatient Hospital Stay (HOSPITAL_BASED_OUTPATIENT_CLINIC_OR_DEPARTMENT_OTHER): Payer: Medicare Other | Admitting: Internal Medicine

## 2019-02-07 ENCOUNTER — Telehealth: Payer: Self-pay | Admitting: Internal Medicine

## 2019-02-07 VITALS — BP 124/89 | HR 110 | Temp 98.0°F | Resp 18 | Ht 63.0 in | Wt 160.8 lb

## 2019-02-07 DIAGNOSIS — C3490 Malignant neoplasm of unspecified part of unspecified bronchus or lung: Secondary | ICD-10-CM

## 2019-02-07 DIAGNOSIS — Z85118 Personal history of other malignant neoplasm of bronchus and lung: Secondary | ICD-10-CM

## 2019-02-07 DIAGNOSIS — Z923 Personal history of irradiation: Secondary | ICD-10-CM | POA: Diagnosis not present

## 2019-02-07 DIAGNOSIS — Z9221 Personal history of antineoplastic chemotherapy: Secondary | ICD-10-CM

## 2019-02-07 DIAGNOSIS — C4310 Malignant melanoma of unspecified eyelid, including canthus: Secondary | ICD-10-CM

## 2019-02-07 NOTE — Progress Notes (Signed)
[place] [phone]   Fax:([phone]) Baker, MD [address] [place] [address]  DIAGNOSIS: Limited stage small cell lung cancer diagnosed in January of 2013   PRIOR THERAPY:  1) Systemic chemotherapy with cisplatin at 60 mg per meter square given on day 1 and etoposide at 120 mg per meter square given on days one 2 and 3 with Neulasta support given on day 4 status post 4 cycles, last cycle was given on 12/02/2011 . This with concurrent with radiotherapy under the care of Dr. Lisbeth Renshaw.  2) prophylactic cranial irradiation under the care of Dr. Lisbeth Renshaw completed on 01/21/2012   CURRENT THERAPY: Observation.  INTERVAL HISTORY: [name] [name] 68 y.o. female returns to the clinic today for annual follow-up visit.  Her sister was available by phone during the visit.  The patient is feeling fine today with no concerning complaints.  She denied having any chest pain, shortness of breath, cough or hemoptysis.  She denied having any fever or chills.  She has no nausea, vomiting, diarrhea or constipation.  She denied having any headache or visual changes.  She had repeat CT scan of the chest performed recently and she is here for evaluation and discussion of her scan results.  MEDICAL HISTORY: Past Medical History:  Diagnosis Date  . Anxiety   . Bilateral lower extremity pain   . Cholelithiasis   . Colon polyps    adenomatous  . High cholesterol   . Hx of radiation therapy 10/07/11 to 11/20/11   right lung  . Hx of radiation therapy 01/07/2012- 01/21/12   cranial irradiation  . Hypertension   . Lung cancer ([place]) 09/19/11   Stage III currently in remission  . Malignant melanoma of skin of canthus of right eye ([place]) 02/2017  . Osteoporosis 04/2014   Dexa scan by Dr. Corinna Capra, T-score -2.7, next in 2 years, with at least 13 % major fracture in 10 years  . Pitting edema    Bilateral lower extremities, duplex  ultrasound 04/03/2015 normal, no DVT    ALLERGIES:  is allergic to codeine and penicillins.  MEDICATIONS:  Current Outpatient Medications  Medication Sig Dispense Refill  . ALPRAZolam (XANAX) 0.5 MG tablet Take 0.5 mg by mouth daily as needed for anxiety. Take 1-2 tablets by mouth daily as needed for anxiety    . aspirin EC 81 MG tablet Take 81 mg daily by mouth.    . Cholecalciferol (VITAMIN D3) 2000 UNITS capsule Take 4,000 Units by mouth daily.     . citalopram (CELEXA) 40 MG tablet Take 1 tablet (40 mg total) by mouth daily. 90 tablet 3  . Cyanocobalamin 2500 MCG CHEW Chew 2 tablets by mouth daily.    Marland Kitchen ezetimibe (ZETIA) 10 MG tablet Take 1 tablet (10 mg total) by mouth daily. 90 tablet 3  . lisinopril (PRINIVIL,ZESTRIL) 20 MG tablet Take 1 tablet (20 mg total) by mouth daily. 90 tablet 3  . Loperamide HCl (IMODIUM PO) Take 3 mLs by mouth daily as needed. Reported on 02/07/2016    . PRALUENT 150 MG/ML SOAJ Inject 1 Dose into the skin every 14 (fourteen) days. 8 pen 0  . raloxifene (EVISTA) 60 MG tablet Take 60 mg by mouth daily.    . rosuvastatin (CRESTOR) 40 MG tablet Take 1 tablet (40 mg total) by mouth at bedtime. 90 tablet 1   No current facility-administered medications for  this visit.     SURGICAL HISTORY:  Past Surgical History:  Procedure Laterality Date  . CHOLECYSTECTOMY  04/2011   biliary stent placement  . [place]  2015  . MOHS SURGERY Right 02/2017   cheek and eyelid  . Josph Macho Touch     Vaginal procedure  . TUBAL LIGATION      REVIEW OF SYSTEMS:  A comprehensive review of systems was negative.   PHYSICAL EXAMINATION: General appearance: alert, cooperative and no distress Head: Normocephalic, without obvious abnormality, atraumatic Neck: no adenopathy Lymph nodes: Cervical, supraclavicular, and axillary nodes normal. Resp: clear to auscultation bilaterally Back: symmetric, no curvature. ROM normal. No CVA tenderness. Cardio: regular rate and  rhythm, S1, S2 normal, no murmur, click, rub or gallop GI: soft, non-tender; bowel sounds normal; no masses,  no organomegaly Extremities: extremities normal, atraumatic, no cyanosis or edema  ECOG PERFORMANCE STATUS: 0 - Asymptomatic  Blood pressure 124/89, pulse (!) 110, temperature 98 F (36.7 C), temperature source Oral, resp. rate 18, height 5\' 3"  (1.6 m), weight 160 lb 12.8 oz (72.9 kg), SpO2 99 %.  LABORATORY DATA: Lab Results  Component Value Date   WBC 6.8 02/04/2019   HGB 12.6 02/04/2019   HCT 38.7 02/04/2019   MCV 95.1 02/04/2019   PLT 195 02/04/2019      Chemistry      Component Value Date/Time   NA 139 02/04/2019 1355   NA 143 02/05/2017 0817   K 4.3 02/04/2019 1355   K 4.3 02/05/2017 0817   CL 104 02/04/2019 1355   CL 104 12/23/2012 1338   CO2 22 02/04/2019 1355   CO2 25 02/05/2017 0817   BUN 16 02/04/2019 1355   BUN 16.0 02/05/2017 0817   CREATININE 1.00 02/04/2019 1355   CREATININE 1.0 02/05/2017 0817      Component Value Date/Time   CALCIUM 9.6 02/04/2019 1355   CALCIUM 9.5 02/05/2017 0817   ALKPHOS 66 02/04/2019 1355   ALKPHOS 48 02/05/2017 0817   AST 35 02/04/2019 1355   AST 21 02/05/2017 0817   ALT 26 02/04/2019 1355   ALT 12 02/05/2017 0817   BILITOT 0.4 02/04/2019 1355   BILITOT 0.31 02/05/2017 0817       RADIOGRAPHIC STUDIES: Ct Chest W Contrast  Result Date: 02/04/2019 CLINICAL DATA:  Small-cell lung cancer, status post primary therapy history of malignant melanoma. Chemotherapy and radiation therapy completed in 2013. EXAM: CT CHEST WITH CONTRAST TECHNIQUE: Multidetector CT imaging of the chest was performed during intravenous contrast administration. CONTRAST:  74mL OMNIPAQUE IOHEXOL 300 MG/ML  SOLN COMPARISON:  02/05/2018 FINDINGS: Cardiovascular: Aortic and branch vessel atherosclerosis. Normal heart size, without pericardial effusion. Multivessel coronary artery atherosclerosis. No central pulmonary embolism, on this non-dedicated  study. Mediastinum/Nodes: No supraclavicular adenopathy. No mediastinal or hilar adenopathy. Lungs/Pleura: No pleural fluid. Posteromedial right pleural thickening is unchanged. Mild centrilobular emphysema. Similar appearance of consolidation and traction bronchiectasis in the posteromedial right upper lobe and adjacent superior segment right lower lobe. No residual or locally recurrent disease. There is a separate area of posterior right apical pleuroparenchymal thickening including on image 27/7 which is unchanged. Upper Abdomen: Moderate hepatic steatosis. Normal imaged portions of the spleen, stomach, adrenal glands, right kidney. Musculoskeletal: Eighth and ninth posterolateral left rib healing fractures are new since the prior exam. There also healing right tenth and eleventh rib fractures posteriorly which were present on the prior. IMPRESSION: 1. Similar appearance of radiation fibrosis in the posteromedial right upper lobe and adjacent  superior segment right lower lobe. No evidence of locally recurrent or metastatic disease. 2. Age advanced coronary artery atherosclerosis. Recommend assessment of coronary risk factors and consideration of medical therapy. 3. Hepatic steatosis. 4. Nonacute bilateral rib fractures. The left-sided fractures are new in the interval. Aortic Atherosclerosis (ICD10-I70.0) and Emphysema (ICD10-J43.9). Electronically Signed   By: Abigail Miyamoto M.D.   On: 02/04/2019 16:50   ASSESSMENT AND PLAN:  This is a very pleasant 68 years old white female with limited stage small cell lung cancer status post systemic chemotherapy with cisplatin and etoposide concurrent with radiation and followed by prophylactic cranial irradiation completed in May 2013. The patient has been on observation since that time and she is feeling fine. She had repeat CT scan of the chest performed recently.  I personally and independently reviewed the scan and discussed the result with the patient today. Her  scan showed no concerning findings for disease recurrence. I recommended for her to continue on observation with repeat CT scan of the chest in 1 year. She was advised to call immediately if she has any concerning symptoms in the interval. The patient voices understanding of current disease status and treatment options and is in agreement with the current care plan. All questions were answered. The patient knows to call the clinic with any problems, questions or concerns. We can certainly see the patient much sooner if necessary.  Disclaimer: This note was dictated with voice recognition software. Similar sounding words can inadvertently be transcribed and may not be corrected upon review.

## 2019-02-07 NOTE — Telephone Encounter (Signed)
Call returned to the patient. She stated that she took her last Praluent injection today. According to epic, it looks like she needs to have a fasting lipid before a repeat prior auth for Praluent can be completed (depending on whether she has seen improvement).  The patient also wanted Dr. Debara Pickett to know that she "loves" the injections and it was easier than she thought. It is expensive and she would like to know if she can reapply for assistance.   Message has been routed.

## 2019-02-07 NOTE — Telephone Encounter (Signed)
New Message     Pt is calling and says she used her last  Praluent injection and she doesn't have another and she is wondering if she is suppose to continue taking it  She says she may need blood work done before getting any more    Please call

## 2019-02-08 NOTE — Telephone Encounter (Signed)
Thanks . We can order a lipid and work on Media planner.  Dr Lemmie Evens

## 2019-02-08 NOTE — Telephone Encounter (Signed)
Patient is returning your call.  

## 2019-02-08 NOTE — Telephone Encounter (Signed)
Spoke with patient. She will come to our office for fasting labs and a new PA will be submitted after this. She was provided with the info for [employer] and will look into this.

## 2019-02-08 NOTE — Telephone Encounter (Signed)
LMTCB  Lipid panel order is active in Epic Patient was denied PASS praluent patient assistance Haleigh CMA notified patient of [place] info to look into assistance thru this program

## 2019-02-10 ENCOUNTER — Telehealth: Payer: Self-pay | Admitting: Internal Medicine

## 2019-02-10 NOTE — Telephone Encounter (Signed)
Scheduled per los. Mailed printout  °

## 2019-02-15 DIAGNOSIS — E785 Hyperlipidemia, unspecified: Secondary | ICD-10-CM | POA: Diagnosis not present

## 2019-02-15 LAB — LIPID PANEL
Chol/HDL Ratio: 1.4 ratio (ref 0.0–4.4)
Cholesterol, Total: 116 mg/dL (ref 100–199)
HDL: 82 mg/dL (ref >39)
LDL Calculated: 14 mg/dL (ref 0–99)
Triglycerides: 98 mg/dL (ref 0–149)
VLDL Cholesterol Cal: 20 mg/dL (ref 5–40)

## 2019-02-16 ENCOUNTER — Telehealth: Payer: Self-pay | Admitting: Internal Medicine

## 2019-02-16 NOTE — Telephone Encounter (Signed)
PA for Praluent 150mg /mL submitted via [email or web address]  Key: [id number] - PA Case ID: [id number]

## 2019-02-16 NOTE — Telephone Encounter (Signed)
Request Reference Number: [id number]. PRALUENT INJ 150MG /ML is approved through 08/25/2019

## 2019-02-21 NOTE — Telephone Encounter (Signed)
Spoke with patient who would like to reapply for PASS (Praluent patient assistance)  Application left at front for patient to complete

## 2019-02-28 NOTE — Telephone Encounter (Signed)
Spoke with patient to clarify income on application received. Will fax today [phone]  Sample of praluent 150mg /mL provided - patient to pick up this week - she is overdue for injection

## 2019-03-14 NOTE — Telephone Encounter (Signed)
Spoke with PASS. Patient approved on 02/28/2019 and shipment of Praluent went out on 03/02/2019. Patient is approved until 08/25/2019.

## 2019-04-14 ENCOUNTER — Ambulatory Visit (INDEPENDENT_AMBULATORY_CARE_PROVIDER_SITE_OTHER): Payer: Medicare Other | Admitting: Internal Medicine

## 2019-04-14 ENCOUNTER — Other Ambulatory Visit: Payer: Self-pay

## 2019-04-14 ENCOUNTER — Encounter: Payer: Self-pay | Admitting: Internal Medicine

## 2019-04-14 VITALS — BP 96/64 | HR 101 | Temp 97.2°F | Ht 63.0 in | Wt 156.3 lb

## 2019-04-14 DIAGNOSIS — R931 Abnormal findings on diagnostic imaging of heart and coronary circulation: Secondary | ICD-10-CM | POA: Diagnosis not present

## 2019-04-14 DIAGNOSIS — I2510 Atherosclerotic heart disease of native coronary artery without angina pectoris: Secondary | ICD-10-CM

## 2019-04-14 DIAGNOSIS — E7849 Other hyperlipidemia: Secondary | ICD-10-CM

## 2019-04-14 NOTE — Progress Notes (Signed)
Chief Complaint:  Follow-up dyslipidemia  Primary Care Physician: Colon Branch, MD  HPI:  [name] [name] is a 68 y.o. female who is being seen today for the evaluation of dyslipidemia and statin intolerance at the request of Colon Branch, MD.  [name] [name] is a pleasant 68 year old female with an unfortunate history of lung cancer in the past status post radiation therapy.  She also has hypertension and marked dyslipidemia.  She is currently treated on rosuvastatin 40 mg and ezetimibe 10 mg daily.  Her last lipid profile from January 2019 indicated total cholesterol 334, triglycerides 156, HDL 73 and LDL at 229.  While she does have a high HDL, I suspect that it is likely nonfunctional.  This is actually an atherogenic lipid profile.  In fact, a chest CT scan for follow-up of her lung malignancy and June 2018 demonstrated "dense coronary artery vascular calcifications".  This could be also related to radiation injury to the coronaries.  She does have a history of stroke in her father and A. fib in her mother.  She is not aware of her sister has any cardiovascular disease and she does have 2 children.  Neither of them have been screened for elevated cholesterol.  She also reports eating a very atherogenic diet.  She eats 1-2 eggs several days a week, shrimp, and chicken livers, amongst other foods.  She reports recently having problems with the diarrhea and saw Dr. Henrene Pastor, who prescribed her for Colestid.  This was for the tablets, but she reports also a history of difficulty swallowing tablets.  Based on the size of these tablets, she is not taking the medication.  03/25/2018  [name] [name] returns today for follow-up.  She is done very well at lowering her cholesterol.  She is made significant dietary changes and has been taking Colestid once daily (not twice daily) in addition to rosuvastatin 40 mg.  She is also on Zetia 10 mg daily.  Her LDL cholesterol has gone down from 229-133.  She  still not at goal given her extensive atherosclerosis of LDL less than 70.  Her last lipid profile was in April and will need to repeat that today.  07/01/2018  [name] [name] returns today for follow-up of dyslipidemia.  She is tolerating rosuvastatin 40 mg daily as well as ezetimibe 10 mg daily.  Unfortunately she has multivessel coronary artery calcification.  Her goal LDL is less than 70.  Most recently she had a lipid profile 3 months ago which showed total cholesterol 242, triglycerides 143, HDL 84 and LDL 129.  As mentioned she is a good candidate for PCSK9 inhibitor however when we applied for Repatha her insurance denied it.  It was recommended that Praluent may be the preferred agent therefore will go ahead and pursue that.  She would not be a candidate for the upcoming Vesalius trial.  04/14/2019  [name] [name] is seen today in follow-up in the lipid clinic.  Via telemedicine visit on December 06, 2018.  At that time she had prior authorization for Praluent but had not started taking it.  She was denied for patient assistance in the past program.  Ahead and get the prescription filled and start taking it.  She did do that and subsequently action in her cholesterol.  Total cholesterol has decreased from 273 down to 116.  Triglycerides are 98, HDL 82 and LDL cholesterol is improved from 159 down to 14.  Overall she loves the medicine and says that she is  tolerating it very well.  PMHx:  Past Medical History:  Diagnosis Date  . Anxiety   . Bilateral lower extremity pain   . Cholelithiasis   . Colon polyps    adenomatous  . High cholesterol   . Hx of radiation therapy 10/07/11 to 11/20/11   right lung  . Hx of radiation therapy 01/07/2012- 01/21/12   cranial irradiation  . Hypertension   . Lung cancer ([place]) 09/19/11   Stage III currently in remission  . Malignant melanoma of skin of canthus of right eye ([place]) 02/2017  . Osteoporosis 04/2014   Dexa scan by Dr. Corinna Capra, T-score -2.7, next in  2 years, with at least 13 % major fracture in 10 years  . Pitting edema    Bilateral lower extremities, duplex ultrasound 04/03/2015 normal, no DVT    Past Surgical History:  Procedure Laterality Date  . CHOLECYSTECTOMY  04/2011   biliary stent placement  . [place]  2015  . MOHS SURGERY Right 02/2017   cheek and eyelid  . Josph Macho Touch     Vaginal procedure  . TUBAL LIGATION      FAMHx:  Family History  Problem Relation Age of Onset  . Bladder Cancer Father   . Stroke Father   . Colon polyps Father   . Atrial fibrillation Mother   . Heart disease Mother        CHF  . Clotting disorder Mother        warfarin  . Colon polyps Mother   . Colon cancer Maternal Aunt 63  . Colon polyps Sister   . Drug abuse Son   . Breast cancer Neg Hx     SOCHx:   reports that she quit smoking about 7 years ago. Her smoking use included cigarettes. She has never used smokeless tobacco. She reports current alcohol use. She reports that she does not use drugs.  ALLERGIES:  Allergies  Allergen Reactions  . Codeine Nausea And Vomiting  . Penicillins Rash    ROS: Pertinent items noted in HPI and remainder of comprehensive ROS otherwise negative.  HOME MEDS: Current Outpatient Medications on File Prior to Visit  Medication Sig Dispense Refill  . ALPRAZolam (XANAX) 0.5 MG tablet Take 0.5 mg by mouth daily as needed for anxiety. Take 1-2 tablets by mouth daily as needed for anxiety    . aspirin EC 81 MG tablet Take 81 mg daily by mouth.    . Cholecalciferol (VITAMIN D3) 2000 UNITS capsule Take 4,000 Units by mouth daily.     . citalopram (CELEXA) 40 MG tablet Take 1 tablet (40 mg total) by mouth daily. 90 tablet 3  . Cyanocobalamin 2500 MCG CHEW Chew 2 tablets by mouth daily.    Marland Kitchen lisinopril (PRINIVIL,ZESTRIL) 20 MG tablet Take 1 tablet (20 mg total) by mouth daily. 90 tablet 3  . Loperamide HCl (IMODIUM PO) Take 3 mLs by mouth daily as needed. Reported on 02/07/2016    .  PRALUENT 150 MG/ML SOAJ Inject 1 Dose into the skin every 14 (fourteen) days. 8 pen 0  . raloxifene (EVISTA) 60 MG tablet Take 60 mg by mouth daily.    . rosuvastatin (CRESTOR) 40 MG tablet Take 1 tablet (40 mg total) by mouth at bedtime. 90 tablet 1   No current facility-administered medications on file prior to visit.     LABS/IMAGING: No results found for this or any previous visit (from the past 48 hour(s)). No results found.  LIPID PANEL:  Component Value Date/Time   CHOL 116 02/15/2019 1024   TRIG 98 02/15/2019 1024   HDL 82 02/15/2019 1024   CHOLHDL 1.4 02/15/2019 1024   CHOLHDL 5 09/24/2017 0902   VLDL 31.2 09/24/2017 0902   LDLCALC 14 02/15/2019 1024   LDLDIRECT 282.7 12/03/2012 1138    WEIGHTS: Wt Readings from Last 3 Encounters:  04/14/19 156 lb 4.8 oz (70.9 kg)  02/07/19 160 lb 12.8 oz (72.9 kg)  12/06/18 163 lb 1.6 oz (74 kg)    VITALS: BP 96/64   Pulse (!) 101   Temp (!) 97.2 F (36.2 C) (Temporal)   Ht 5\' 3"  (1.6 m)   Wt 156 lb 4.8 oz (70.9 kg)   BMI 27.69 kg/m   EXAM: Deferred  EKG: N/A  ASSESSMENT: 1. Extensive coronary artery calcification 2. Dyslipidemia not at goal LDL less than 70 3. Probable FH - Dutch score of 6 4. Atherogenic diet  PLAN: 1.   [name] [name] has had an excellent response to Praluent and her cholesterol has decreased significantly.  Her LDL now is 14 and she is also on ezetimibe and high potency rosuvastatin.  I believe she could discontinue the ezetimibe at this point given her excellent response to therapy but would continue high intensity rosuvastatin.  Overall she is doing well.  Plan a repeat lipid profile and follow-up in 6 months.  Pixie Casino, MD, [facility], [place] Director of the Advanced Lipid Disorders &  Cardiovascular Risk Reduction Clinic Diplomate of the American Board of Clinical Lipidology Attending Cardiologist  Direct Dial: [phone]  Fax: [phone]   Website:  [email or web address]  Nadean Corwin Shiquan Mathieu 04/14/2019, 1:27 PM

## 2019-04-14 NOTE — Patient Instructions (Signed)
Medication Instructions:  STOP zetia Continue other medications If you need a refill on your cardiac medications before your next appointment, please call your pharmacy.   Lab work: FASTING lab work in 6 months to check cholesterol - we will mail a lab order If you have labs (blood work) drawn today and your tests are completely normal, you will receive your results only by: Marland Kitchen MyChart Message (if you have MyChart) OR . A paper copy in the mail If you have any lab test that is abnormal or we need to change your treatment, we will call you to review the results.  Testing/Procedures: NONE  Follow-Up: Dr. Debara Pickett recommends that you schedule a follow up visit with him the in the [place] in 6 months. Please have fasting blood work about 1 week prior to this visit and he will review the blood work results with you at your appointment.

## 2019-04-22 ENCOUNTER — Telehealth: Payer: Self-pay | Admitting: Internal Medicine

## 2019-04-22 NOTE — Telephone Encounter (Signed)
Talked to patient today. Per DR hilty she is to continue rosuvastatin and Praluent. STOP ezetimibe 10mg .

## 2019-04-22 NOTE — Telephone Encounter (Signed)
Pt calling stating that she would like [place], RN to give her a call to advise her if she still needs to be taking Rosuvastatin, because she also take Praluent as well. Please address

## 2019-04-22 NOTE — Telephone Encounter (Signed)
Please advise, they normally take this medication with it too correct? Thank you!

## 2019-05-18 ENCOUNTER — Other Ambulatory Visit: Payer: Self-pay | Admitting: Obstetrics and Gynecology

## 2019-05-18 DIAGNOSIS — Z1231 Encounter for screening mammogram for malignant neoplasm of breast: Secondary | ICD-10-CM

## 2019-05-23 DIAGNOSIS — Z6826 Body mass index (BMI) 26.0-26.9, adult: Secondary | ICD-10-CM | POA: Diagnosis not present

## 2019-05-23 DIAGNOSIS — N958 Other specified menopausal and perimenopausal disorders: Secondary | ICD-10-CM | POA: Diagnosis not present

## 2019-05-23 DIAGNOSIS — Z124 Encounter for screening for malignant neoplasm of cervix: Secondary | ICD-10-CM | POA: Diagnosis not present

## 2019-05-23 DIAGNOSIS — M816 Localized osteoporosis [Lequesne]: Secondary | ICD-10-CM | POA: Diagnosis not present

## 2019-05-23 LAB — HM PAP SMEAR

## 2019-05-31 ENCOUNTER — Other Ambulatory Visit: Payer: Self-pay | Admitting: Internal Medicine

## 2019-05-31 ENCOUNTER — Encounter: Payer: Self-pay | Admitting: Internal Medicine

## 2019-05-31 DIAGNOSIS — E785 Hyperlipidemia, unspecified: Secondary | ICD-10-CM

## 2019-06-29 ENCOUNTER — Telehealth: Payer: Self-pay | Admitting: Internal Medicine

## 2019-06-29 NOTE — Telephone Encounter (Signed)
praluent patient assistance application mailed to patient with instructions to complete/return

## 2019-06-29 NOTE — Telephone Encounter (Signed)
The patient has been advised that she is correct. She should be off the Zetia and take the Praluent plus Rosuvastatin. She has verbalized her understanding.

## 2019-06-29 NOTE — Telephone Encounter (Signed)
New Message:   Pt wants to know if she is supposed to stop taking the Ezetimibe and continue taking her Rosuvastatin and Praluent?

## 2019-07-01 ENCOUNTER — Ambulatory Visit
Admission: RE | Admit: 2019-07-01 | Discharge: 2019-07-01 | Disposition: A | Discharge status: Home / Self Care | Payer: Medicare Other | Source: Ambulatory Visit | Attending: Obstetrics and Gynecology | Admitting: Obstetrics and Gynecology

## 2019-07-01 ENCOUNTER — Other Ambulatory Visit: Payer: Self-pay

## 2019-07-01 DIAGNOSIS — Z1231 Encounter for screening mammogram for malignant neoplasm of breast: Secondary | ICD-10-CM | POA: Diagnosis not present

## 2019-07-03 DIAGNOSIS — Z23 Encounter for immunization: Secondary | ICD-10-CM | POA: Diagnosis not present

## 2019-07-08 NOTE — Telephone Encounter (Signed)
Called [employer] and completed PA on the phone as it could not be submitted on [email or web address] Approved until 08/24/2020 [id number]

## 2019-07-10 ENCOUNTER — Other Ambulatory Visit: Payer: Self-pay | Admitting: Internal Medicine

## 2019-07-15 ENCOUNTER — Encounter: Payer: PRIVATE HEALTH INSURANCE | Admitting: Internal Medicine

## 2019-07-15 ENCOUNTER — Telehealth: Payer: Self-pay | Admitting: Internal Medicine

## 2019-07-15 ENCOUNTER — Other Ambulatory Visit: Payer: Self-pay

## 2019-07-15 ENCOUNTER — Ambulatory Visit (INDEPENDENT_AMBULATORY_CARE_PROVIDER_SITE_OTHER): Payer: Medicare Other | Admitting: Internal Medicine

## 2019-07-15 ENCOUNTER — Encounter: Payer: Self-pay | Admitting: Internal Medicine

## 2019-07-15 VITALS — BP 157/86 | HR 94 | Temp 97.4°F | Resp 16 | Ht 63.0 in | Wt 151.4 lb

## 2019-07-15 DIAGNOSIS — E538 Deficiency of other specified B group vitamins: Secondary | ICD-10-CM | POA: Diagnosis not present

## 2019-07-15 DIAGNOSIS — H9319 Tinnitus, unspecified ear: Secondary | ICD-10-CM

## 2019-07-15 DIAGNOSIS — I2510 Atherosclerotic heart disease of native coronary artery without angina pectoris: Secondary | ICD-10-CM

## 2019-07-15 DIAGNOSIS — Z1211 Encounter for screening for malignant neoplasm of colon: Secondary | ICD-10-CM

## 2019-07-15 DIAGNOSIS — I10 Essential (primary) hypertension: Secondary | ICD-10-CM

## 2019-07-15 DIAGNOSIS — R931 Abnormal findings on diagnostic imaging of heart and coronary circulation: Secondary | ICD-10-CM

## 2019-07-15 DIAGNOSIS — E785 Hyperlipidemia, unspecified: Secondary | ICD-10-CM | POA: Diagnosis not present

## 2019-07-15 LAB — BASIC METABOLIC PANEL
BUN: 22 mg/dL (ref 6–23)
CO2: 26 mEq/L (ref 19–32)
Calcium: 9.2 mg/dL (ref 8.4–10.5)
Chloride: 103 mEq/L (ref 96–112)
Creatinine, Ser: 0.94 mg/dL (ref 0.40–1.20)
GFR: 59.5 mL/min — ABNORMAL LOW (ref >60.00)
Glucose, Bld: 81 mg/dL (ref 70–99)
Potassium: 4.2 mEq/L (ref 3.5–5.1)
Sodium: 138 mEq/L (ref 135–145)

## 2019-07-15 LAB — VITAMIN B12: Vitamin B-12: >1500 pg/mL — ABNORMAL HIGH (ref 211–911)

## 2019-07-15 NOTE — Patient Instructions (Addendum)
Please schedule Medicare Wellness with Glenard Haring.   GO TO THE LAB : Get the blood work     GO TO THE FRONT DESK Schedule your next appointment   For a check up in 6- 8 months      Check the  blood pressure 2   times a month   BP GOAL is between 110/65 and  135/85. If it is consistently higher or lower, let me know  Tylenol  500 mg OTC 2 tabs a day every 8 hours as needed for pain

## 2019-07-15 NOTE — Telephone Encounter (Signed)
Patient has an appt with Dr. Debara Pickett on 10/24/19. She needs to know if lab orders need to be put in prior to this visit. She has a dr's appt later on today and asks that if she does not answer to please leave a VM.

## 2019-07-15 NOTE — Progress Notes (Signed)
Pre visit review using our clinic review tool, if applicable. No additional management support is needed unless otherwise documented below in the visit note. 

## 2019-07-15 NOTE — Progress Notes (Signed)
Subjective:    Patient ID: [name] [name], female    DOB: 15-Jun-1951, 67 y.o.   MRN: [id number]  DOS:  07/15/2019 Type of visit - description: ROV High cholesterol: Cardiology note reviewed History of lung cancer: Oncology note reviewed DJD?  For the last few weeks when she wakes up she has pain at the fourth and fifth fingers bilaterally, at the DIPs and PIPs.  No actual swelling, mild TTP.  Symptoms get better ~ 30-40 minutes after she wakes up. BP today is elevated, no ambulatory BPs Also reported tinnitus, not sure if it is unilateral or bilateral, no HOH.  BP Readings from Last 3 Encounters:  07/15/19 (!) 157/86  04/14/19 96/64  02/07/19 124/89     Review of Systems Feeling great Still active Has chronic mild diarrhea without nausea, vomiting, or blood in the stools No chest pain no difficulty breathing  Past Medical History:  Diagnosis Date  . Anxiety   . Bilateral lower extremity pain   . Cholelithiasis   . Colon polyps    adenomatous  . High cholesterol   . Hx of radiation therapy 10/07/11 to 11/20/11   right lung  . Hx of radiation therapy 01/07/2012- 01/21/12   cranial irradiation  . Hypertension   . Lung cancer ([place]) 09/19/11   Stage III currently in remission  . Malignant melanoma of skin of canthus of right eye ([place]) 02/2017  . Osteoporosis 04/2014   Dexa scan by Dr. Corinna Capra, T-score -2.7, next in 2 years, with at least 13 % major fracture in 10 years  . Pitting edema    Bilateral lower extremities, duplex ultrasound 04/03/2015 normal, no DVT    Past Surgical History:  Procedure Laterality Date  . CHOLECYSTECTOMY  04/2011   biliary stent placement  . [place]  2015  . MOHS SURGERY Right 02/2017   cheek and eyelid  . Josph Macho Touch     Vaginal procedure  . TUBAL LIGATION      Social History   Socioeconomic History  . Marital status: Divorced    Spouse name: Not on file  . Number of children: 2  . Years of education: Not on  file  . Highest education level: Not on file  Occupational History  . Occupation: retired, Lexicographer  . Financial resource strain: Not on file  . Food insecurity    Worry: Not on file    Inability: Not on file  . Transportation needs    Medical: Not on file    Non-medical: Not on file  Tobacco Use  . Smoking status: Former Smoker    Types: Cigarettes    Quit date: 09/08/2011    Years since quitting: 7.8  . Smokeless tobacco: Never Used  Substance and Sexual Activity  . Alcohol use: Yes    Comment: occasional  . Drug use: No  . Sexual activity: Not on file  Lifestyle  . Physical activity    Days per week: Not on file    Minutes per session: Not on file  . Stress: Not on file  Relationships  . Social Herbalist on phone: Not on file    Gets together: Not on file    Attends religious service: Not on file    Active member of club or organization: Not on file    Attends meetings of clubs or organizations: Not on file    Relationship status: Not on file  . Intimate partner violence  Fear of current or ex partner: Not on file    Emotionally abused: Not on file    Physically abused: Not on file    Forced sexual activity: Not on file  Other Topics Concern  . Not on file  Social History Narrative   Divorced   Lost 1 child June 2019    lives by herself          Allergies as of 07/15/2019      Reactions   Codeine Nausea And Vomiting   Penicillins Rash      Medication List       Accurate as of July 15, 2019 11:59 PM. If you have any questions, ask your nurse or doctor.        ALPRAZolam 0.5 MG tablet Commonly known as: XANAX Take 0.5 mg by mouth daily as needed for anxiety. Take 1-2 tablets by mouth daily as needed for anxiety   aspirin EC 81 MG tablet Take 81 mg daily by mouth.   citalopram 40 MG tablet Commonly known as: CELEXA Take 1 tablet (40 mg total) by mouth daily.   Cyanocobalamin 2500 MCG Chew Chew 2 tablets by mouth  daily.   IMODIUM PO Take 3 mLs by mouth daily as needed. Reported on 02/07/2016   lisinopril 20 MG tablet Commonly known as: ZESTRIL Take 1 tablet (20 mg total) by mouth daily.   Praluent 150 MG/ML Soaj Generic drug: Alirocumab Inject 1 Dose into the skin every 14 (fourteen) days.   raloxifene 60 MG tablet Commonly known as: EVISTA Take 60 mg by mouth daily.   rosuvastatin 40 MG tablet Commonly known as: CRESTOR Take 1 tablet (40 mg total) by mouth at bedtime.   Vitamin D3 50 MCG (2000 UT) capsule Take 4,000 Units by mouth daily.           Objective:   Physical Exam BP (!) 157/86 (BP Location: Left Arm, Patient Position: Sitting, Cuff Size: Small)   Pulse 94   Temp (!) 97.4 F (36.3 C) (Temporal)   Resp 16   Ht 5\' 3"  (1.6 m)   Wt 151 lb 7 oz (68.7 kg)   SpO2 100%   BMI 26.83 kg/m  General: Well developed, NAD, BMI noted Neck: No  thyromegaly  HEENT:  Normocephalic . Face symmetric, atraumatic. TMs normal, ear canals normal Lungs:  CTA B Normal respiratory effort, no intercostal retractions, no accessory muscle use. Heart: RRR,  no murmur.  No pretibial edema bilaterally  Abdomen:  Not distended, soft, non-tender. No rebound or rigidity.  MSK: Hands with no synovitis and minimal bony changes consistent with DJD Skin: Exposed areas without rash. Not pale. Not jaundice Neurologic:  alert & oriented X3.  Speech normal, gait appropriate for age and unassisted Strength symmetric and appropriate for age.  Psych: Cognition and judgment appear intact.  Cooperative with normal attention span and concentration.  Behavior appropriate. No anxious or depressed appearing.     Assessment     Assessment   HTN Hyperlipidemia -- cramps with Lipitor dc 2015 Depression . Anxiety -- on citalopram, rarely uses Xanax Osteoporosis-- per DEXA at gyn 05-10-2014 --->  was on boniva, now evista  B12 deficiency dx 01/2014 Vit D  def  COPD: Mild last PFTs  CV: Extensive  coronary artery calcifications Oncology: -Lung cancer SCC DX 2013, XRT chest and brain -melanoma 2018, R face, s/p Mohs Lower extremity edema [place] (-) for DVT 03-2015 Coronary calcifications per CT chest 6 2016 IBS, diarrhea predominant  on Imodium as needed; GI 2015-- rx questran, self d/c d/t constipation ASA intolerant (tinnitus) but ok  taking low-dose  PLAN: Preventive care reviewed HTN: BP slightly elevated today, recommend ambulatory BPs, otherwise continue Zestril.  Check a BMP High cholesterol: Since last OV, saw cardiology, Praluent was added, also on crestor. Coronary calcifications: Controlling cardiovascular risk factors Oncology: Last visit with Dr. Earlie Server 01/2019.  Felt to be stable, on no active treatment forLung cancer Tinnitus: Mild, no HOH.  Wonders about aspirin as the culprit but she is taking a very low dose.  Recommend observation, call if tinnitus becomes unilateral or if she has HOH. B12 deficiency: On oral supplements, checking labs RTC 6 -8 months   This visit occurred during the SARS-CoV-2 public health emergency.  Safety protocols were in place, including screening questions prior to the visit, additional usage of staff PPE, and extensive cleaning of exam room while observing appropriate contact time as indicated for disinfecting solutions.

## 2019-07-15 NOTE — Telephone Encounter (Signed)
Spoke with patient. Notified her she will need fasting labs prior to October 24 2019 visit. Will mail in Feb. She is aware she needs to return her PASS application & bring [employer] approval letter

## 2019-07-15 NOTE — Assessment & Plan Note (Addendum)
Preventive care reviewed -Td shot 2019, s/p zostavax ; pnm 23 2003 and 06-2017; prevnar 2016 -Shingrix : plans to get 08/2019 - had a flu shot .   -Sees gyn: they take care of osteoporosis-[facility]-PAP  -CCS: colonoscopy in 2007 @ [facility], 2 polyps Colonoscopy 05-2013 @ [place], + polyps, due for a colonoscopy,  Referral  sent

## 2019-07-16 NOTE — Assessment & Plan Note (Signed)
Preventive care reviewed HTN: BP slightly elevated today, recommend ambulatory BPs, otherwise continue Zestril.  Check a BMP High cholesterol: Since last OV, saw cardiology, Praluent was added, also on crestor. Coronary calcifications: Controlling cardiovascular risk factors Oncology: Last visit with Dr. Earlie Server 01/2019.  Felt to be stable, on no active treatment forLung cancer Tinnitus: Mild, no HOH.  Wonders about aspirin as the culprit but she is taking a very low dose.  Recommend observation, call if tinnitus becomes unilateral or if she has HOH. B12 deficiency: On oral supplements, checking labs RTC 6 -8 months

## 2019-07-19 ENCOUNTER — Telehealth: Payer: Self-pay | Admitting: Internal Medicine

## 2019-07-19 ENCOUNTER — Encounter: Payer: Self-pay | Admitting: Internal Medicine

## 2019-07-19 NOTE — Telephone Encounter (Signed)
Spoke w/ Pt- informed of results. Instructed to continue same meds/supplements. Pt verbalized understanding.

## 2019-07-19 NOTE — Telephone Encounter (Signed)
Spoke with [name] [name] today. She has questions about her lab results and if she needs to take a supplement for B-12. She would like for you to give her a call back.

## 2019-07-19 NOTE — Telephone Encounter (Signed)
Notes recorded by Damita Dunnings, CMA on 07/18/2019 at 8:17 AM EST  Results mailed.  ------   Notes recorded by Colon Branch, MD on 07/16/2019 at 5:08 PM EST  Send a letter  [name] [name], your blood work came back very good. Continue the same medications and supplements.    Results were placed in mail yesterday- however will call her shortly.

## 2019-07-20 ENCOUNTER — Other Ambulatory Visit: Payer: Self-pay

## 2019-07-27 NOTE — Telephone Encounter (Signed)
Application received. Will be signed by MD

## 2019-08-01 NOTE — Telephone Encounter (Signed)
PASS application for [id number] praluent faxed to [phone]

## 2019-08-10 NOTE — Progress Notes (Signed)
Virtual Visit via Video Note  I connected with patient on 08/11/19 at  1:45 PM EST by audio enabled telemedicine application and verified that I am speaking with the correct person using two identifiers.   THIS ENCOUNTER IS A VIRTUAL VISIT DUE TO COVID-19 - PATIENT WAS NOT SEEN IN THE OFFICE. PATIENT HAS CONSENTED TO VIRTUAL VISIT / TELEMEDICINE VISIT   Location of patient: home  Location of provider: office  I discussed the limitations of evaluation and management by telemedicine and the availability of in person appointments. The patient expressed understanding and agreed to proceed.   Subjective:   [name] [name] is a 68 y.o. female who presents for an Initial Medicare Annual Wellness Visit.  Review of Systems    Home Safety/Smoke Alarms: Feels safe in home. Smoke alarms in place.  Lives alone w/ small dog. Walk-in shower w/ grab bar and bench. Speaks w/ sister daily.   Female:    Mammo- 07/05/19      Dexa scan- pt reports done w/ Dr.Lowe at some point 2020. Will request record.      CCS- scheduled 09/07/18.    Objective:     Advanced Directives 08/11/2019 02/08/2018 10/15/2017 08/27/2017 02/12/2017 02/07/2016 02/07/2016  Does Patient Have a Medical Advance Directive? Yes No No No No No No  Type of Paramedic of [place];Living will - - - - - -  Does patient want to make changes to medical advance directive? No - Patient declined - - - - - -  Copy of [place] in Chart? No - copy requested - - - - - -  Would patient like information on creating a medical advance directive? - - No - Patient declined No - Patient declined No - Patient declined No - patient declined information No - patient declined information  Pre-existing out of facility DNR order (yellow form or pink MOST form) - - - - - - -    Current Medications (verified) Outpatient Encounter Medications as of 08/11/2019  Medication Sig  . aspirin EC 81 MG tablet Take 81 mg  daily by mouth.  . Cholecalciferol (VITAMIN D3) 2000 UNITS capsule Take 4,000 Units by mouth daily.   . citalopram (CELEXA) 40 MG tablet Take 1 tablet (40 mg total) by mouth daily.  . Cyanocobalamin 2500 MCG CHEW Chew 2 tablets by mouth daily.  Marland Kitchen lisinopril (ZESTRIL) 20 MG tablet Take 1 tablet (20 mg total) by mouth daily.  . Loperamide HCl (IMODIUM PO) Take 3 mLs by mouth daily as needed. Reported on 02/07/2016  . PRALUENT 150 MG/ML SOAJ Inject 1 Dose into the skin every 14 (fourteen) days.  . raloxifene (EVISTA) 60 MG tablet Take 60 mg by mouth daily.  . rosuvastatin (CRESTOR) 40 MG tablet Take 1 tablet (40 mg total) by mouth at bedtime.  . ALPRAZolam (XANAX) 0.5 MG tablet Take 0.5 mg by mouth daily as needed for anxiety. Take 1-2 tablets by mouth daily as needed for anxiety   No facility-administered encounter medications on file as of 08/11/2019.    Allergies (verified) Codeine and Penicillins   History: Past Medical History:  Diagnosis Date  . Anxiety   . Bilateral lower extremity pain   . Cholelithiasis   . Colon polyps    adenomatous  . High cholesterol   . Hx of radiation therapy 10/07/11 to 11/20/11   right lung  . Hx of radiation therapy 01/07/2012- 01/21/12   cranial irradiation  . Hypertension   .  Lung cancer ([place]) 09/19/11   Stage III currently in remission  . Malignant melanoma of skin of canthus of right eye ([place]) 02/2017  . Osteoporosis 04/2014   Dexa scan by Dr. Corinna Capra, T-score -2.7, next in 2 years, with at least 13 % major fracture in 10 years  . Pitting edema    Bilateral lower extremities, duplex ultrasound 04/03/2015 normal, no DVT   Past Surgical History:  Procedure Laterality Date  . CHOLECYSTECTOMY  04/2011   biliary stent placement  . [place]  2015  . MOHS SURGERY Right 02/2017   cheek and eyelid  . Josph Macho Touch     Vaginal procedure  . TUBAL LIGATION     Family History  Problem Relation Age of Onset  . Bladder Cancer Father   .  Stroke Father   . Colon polyps Father   . Atrial fibrillation Mother   . Heart disease Mother        CHF  . Clotting disorder Mother        warfarin  . Colon polyps Mother   . Colon cancer Maternal Aunt 9  . Colon polyps Sister   . Drug abuse Son   . Breast cancer Neg Hx    Social History   Socioeconomic History  . Marital status: Divorced    Spouse name: Not on file  . Number of children: 2  . Years of education: Not on file  . Highest education level: Not on file  Occupational History  . Occupation: retired, Recruitment consultant  . Smoking status: Former Smoker    Types: Cigarettes    Quit date: 09/08/2011    Years since quitting: 7.9  . Smokeless tobacco: Never Used  Substance and Sexual Activity  . Alcohol use: Yes    Comment: occasional  . Drug use: No  . Sexual activity: Not on file  Other Topics Concern  . Not on file  Social History Narrative   Divorced   Lost 1 child June 2019    lives by herself       Social Determinants of Health   Financial Resource Strain:   . Difficulty of Paying Living Expenses: Not on file  Food Insecurity:   . Worried About Charity fundraiser in the Last Year: Not on file  . Ran Out of Food in the Last Year: Not on file  Transportation Needs:   . Lack of Transportation (Medical): Not on file  . Lack of Transportation (Non-Medical): Not on file  Physical Activity:   . Days of Exercise per Week: Not on file  . Minutes of Exercise per Session: Not on file  Stress:   . Feeling of Stress : Not on file  Social Connections:   . Frequency of Communication with Friends and Family: Not on file  . Frequency of Social Gatherings with Friends and Family: Not on file  . Attends Religious Services: Not on file  . Active Member of Clubs or Organizations: Not on file  . Attends Archivist Meetings: Not on file  . Marital Status: Not on file    Tobacco Counseling Counseling given: Not Answered   Clinical Intake: Pain :  No/denies pain    Activities of Daily Living In your present state of health, do you have any difficulty performing the following activities: 08/11/2019 07/15/2019  Hearing? N N  Vision? N N  Difficulty concentrating or making decisions? N N  Walking or climbing stairs? N N  Dressing or bathing? N N  Doing errands, shopping? N N  Preparing Food and eating ? N -  Using the Toilet? N -  In the past six months, have you accidently leaked urine? N -  Do you have problems with loss of bowel control? N -  Managing your Medications? N -  Managing your Finances? N -  Housekeeping or managing your Housekeeping? N -  Some recent data might be hidden     Immunizations and Health Maintenance Immunization History  Administered Date(s) Administered  . Influenza Whole 07/02/2009, 06/24/2010  . Influenza, High Dose Seasonal PF 07/21/2016, 07/13/2018, 07/03/2019  . Influenza,inj,Quad PF,6+ Mos 06/07/2014, 07/09/2017  . Pneumococcal Conjugate-13 02/14/2015  . Pneumococcal Polysaccharide-23 08/25/2001, 07/09/2017  . Td 05/18/2007  . Tdap 03/11/2018  . Zoster 12/03/2012   Health Maintenance Due  Topic Date Due  . DEXA SCAN  05/21/2018  . COLONOSCOPY  06/10/2018    Patient Care Team: Colon Branch, MD as PCP - General Louretta Shorten, MD as Consulting Physician (Obstetrics and Gynecology) Debara Pickett Nadean Corwin, MD as Consulting Physician (Cardiology)  Indicate any recent Medical Services you may have received from other than [facility] providers in the past year (date may be approximate).     Assessment:   This is a routine wellness examination for [place]. Physical assessment deferred to PCP.  Hearing/Vision screen Unable to assess. This visit is enabled though telemedicine due to Covid 19.   Dietary issues and exercise activities discussed: Current Exercise Habits: Home exercise routine, Time (Minutes): 30, Frequency (Times/Week): 3, Weekly Exercise (Minutes/Week): 90, Intensity: Mild, Exercise  limited by: None identified Diet (meal preparation, eat out, water intake, caffeinated beverages, dairy products, fruits and vegetables): well balanced     Goals    . Patient Stated     Continue not drinking wine in order to keep weight off.       Depression Screen PHQ 2/9 Scores 08/11/2019 07/15/2019 07/13/2018 03/11/2018 07/21/2016 11/19/2015 05/22/2015  PHQ - 2 Score 0 0 0 0 0 0 0  PHQ- 9 Score - 3 1 - - - -    Fall Risk Fall Risk  08/11/2019 07/15/2019 07/13/2018 03/11/2018 07/21/2016  Falls in the past year? 0 0 0 No No  Follow up Education provided;Falls prevention discussed Falls evaluation completed Falls evaluation completed - -     Cognitive Function:Ad8 score reviewed for issues:  Issues making decisions:no  Less interest in hobbies / activities:no  Repeats questions, stories (family complaining):no  Trouble using ordinary gadgets (microwave, computer, phone):no  Forgets the month or year: no  Mismanaging finances: no  Remembering appts:no  Daily problems with thinking and/or memory:no Ad8 score is=0          Screening Tests Health Maintenance  Topic Date Due  . DEXA SCAN  05/21/2018  . COLONOSCOPY  06/10/2018  . MAMMOGRAM  06/30/2020  . PAP SMEAR-Modifier  05/22/2021  . PNA vac Low Risk Adult (2 of 2 - PPSV23) 07/09/2022  . TETANUS/TDAP  03/11/2028  . INFLUENZA VACCINE  Completed  . Hepatitis C Screening  Completed    Plan:   See you next year!  Continue to eat heart healthy diet (full of fruits, vegetables, whole grains, lean protein, water--limit salt, fat, and sugar intake) and increase physical activity as tolerated.  Continue doing brain stimulating activities (puzzles, reading, adult coloring books, staying active) to keep memory sharp.   Bring a copy of your living will and/or healthcare power of attorney to your next  office visit.    I have personally reviewed and noted the following in the patient's chart:   . Medical and social  history . Use of alcohol, tobacco or illicit drugs  . Current medications and supplements . Functional ability and status . Nutritional status . Physical activity . Advanced directives . List of other physicians . Hospitalizations, surgeries, and ER visits in previous 12 months . Vitals . Screenings to include cognitive, depression, and falls . Referrals and appointments  In addition, I have reviewed and discussed with patient certain preventive protocols, quality metrics, and best practice recommendations. A written personalized care plan for preventive services as well as general preventive health recommendations were provided to patient.     Shela Nevin, [place]   08/11/2019

## 2019-08-11 ENCOUNTER — Ambulatory Visit (INDEPENDENT_AMBULATORY_CARE_PROVIDER_SITE_OTHER): Payer: Medicare Other | Admitting: *Deleted

## 2019-08-11 ENCOUNTER — Encounter: Payer: Self-pay | Admitting: *Deleted

## 2019-08-11 ENCOUNTER — Other Ambulatory Visit: Payer: Self-pay

## 2019-08-11 VITALS — BP 144/85

## 2019-08-11 DIAGNOSIS — Z0000 Encounter for general adult medical examination without abnormal findings: Secondary | ICD-10-CM | POA: Diagnosis not present

## 2019-08-11 NOTE — Patient Instructions (Signed)
See you next year!  Continue to eat heart healthy diet (full of fruits, vegetables, whole grains, lean protein, water--limit salt, fat, and sugar intake) and increase physical activity as tolerated.  Continue doing brain stimulating activities (puzzles, reading, adult coloring books, staying active) to keep memory sharp.   Bring a copy of your living will and/or healthcare power of attorney to your next office visit.   [name] [name] , Thank you for taking time to come for your Medicare Wellness Visit. I appreciate your ongoing commitment to your health goals. Please review the following plan we discussed and let me know if I can assist you in the future.   These are the goals we discussed: Goals    . Patient Stated     Continue not drinking wine in order to keep weight off.        This is a list of the screening recommended for you and due dates:  Health Maintenance  Topic Date Due  . DEXA scan (bone density measurement)  05/21/2018  . Colon Cancer Screening  06/10/2018  . Mammogram  06/30/2020  . Pap Smear  05/22/2021  . Pneumonia vaccines (2 of 2 - PPSV23) 07/09/2022  . Tetanus Vaccine  03/11/2028  . Flu Shot  Completed  .  Hepatitis C: One time screening is recommended by Center for Disease Control  (CDC) for  adults born from [birth year] through 1963.   Completed    Preventive Care 67 Years and Older, Female Preventive care refers to lifestyle choices and visits with your health care provider that can promote health and wellness. This includes:  A yearly physical exam. This is also called an annual well check.  Regular dental and eye exams.  Immunizations.  Screening for certain conditions.  Healthy lifestyle choices, such as diet and exercise. What can I expect for my preventive care visit? Physical exam Your health care provider will check:  Height and weight. These may be used to calculate body mass index (BMI), which is a measurement that tells if you are at a  healthy weight.  Heart rate and blood pressure.  Your skin for abnormal spots. Counseling Your health care provider may ask you questions about:  Alcohol, tobacco, and drug use.  Emotional well-being.  Home and relationship well-being.  Sexual activity.  Eating habits.  History of falls.  Memory and ability to understand (cognition).  Work and work Statistician.  Pregnancy and menstrual history. What immunizations do I need?  Influenza (flu) vaccine  This is recommended every year. Tetanus, diphtheria, and pertussis (Tdap) vaccine  You may need a Td booster every 10 years. Varicella (chickenpox) vaccine  You may need this vaccine if you have not already been vaccinated. Zoster (shingles) vaccine  You may need this after age 71. Pneumococcal conjugate (PCV13) vaccine  One dose is recommended after age 20. Pneumococcal polysaccharide (PPSV23) vaccine  One dose is recommended after age 64. Measles, mumps, and rubella (MMR) vaccine  You may need at least one dose of MMR if you were born in 1957 or later. You may also need a second dose. Meningococcal conjugate (MenACWY) vaccine  You may need this if you have certain conditions. Hepatitis A vaccine  You may need this if you have certain conditions or if you travel or work in places where you may be exposed to hepatitis A. Hepatitis B vaccine  You may need this if you have certain conditions or if you travel or work in places where you may be  exposed to hepatitis B. Haemophilus influenzae type b (Hib) vaccine  You may need this if you have certain conditions. You may receive vaccines as individual doses or as more than one vaccine together in one shot (combination vaccines). Talk with your health care provider about the risks and benefits of combination vaccines. What tests do I need? Blood tests  Lipid and cholesterol levels. These may be checked every 5 years, or more frequently depending on your overall  health.  Hepatitis C test.  Hepatitis B test. Screening  Lung cancer screening. You may have this screening every year starting at age 7 if you have a 30-pack-year history of smoking and currently smoke or have quit within the past 15 years.  Colorectal cancer screening. All adults should have this screening starting at age 75 and continuing until age 34. Your health care provider may recommend screening at age 38 if you are at increased risk. You will have tests every 1-10 years, depending on your results and the type of screening test.  Diabetes screening. This is done by checking your blood sugar (glucose) after you have not eaten for a while (fasting). You may have this done every 1-3 years.  Mammogram. This may be done every 1-2 years. Talk with your health care provider about how often you should have regular mammograms.  BRCA-related cancer screening. This may be done if you have a family history of breast, ovarian, tubal, or peritoneal cancers. Other tests  Sexually transmitted disease (STD) testing.  Bone density scan. This is done to screen for osteoporosis. You may have this done starting at age 62. Follow these instructions at home: Eating and drinking  Eat a diet that includes fresh fruits and vegetables, whole grains, lean protein, and low-fat dairy products. Limit your intake of foods with high amounts of sugar, saturated fats, and salt.  Take vitamin and mineral supplements as recommended by your health care provider.  Do not drink alcohol if your health care provider tells you not to drink.  If you drink alcohol: ? Limit how much you have to 0-1 drink a day. ? Be aware of how much alcohol is in your drink. In the [place] one drink equals one 12 oz bottle of beer (355 mL), one 5 oz glass of wine (148 mL), or one 1 oz glass of hard liquor (44 mL). Lifestyle  Take daily care of your teeth and gums.  Stay active. Exercise for at least 30 minutes on 5 or more days  each week.  Do not use any products that contain nicotine or tobacco, such as cigarettes, e-cigarettes, and chewing tobacco. If you need help quitting, ask your health care provider.  If you are sexually active, practice safe sex. Use a condom or other form of protection in order to prevent STIs (sexually transmitted infections).  Talk with your health care provider about taking a low-dose aspirin or statin. What's next?  Go to your health care provider once a year for a well check visit.  Ask your health care provider how often you should have your eyes and teeth checked.  Stay up to date on all vaccines. This information is not intended to replace advice given to you by your health care provider. Make sure you discuss any questions you have with your health care provider. Document Released: 09/07/2015 Document Revised: 08/05/2018 Document Reviewed: 08/05/2018 Elsevier Patient Education  2020 [employer].

## 2019-08-22 ENCOUNTER — Telehealth: Payer: Self-pay | Admitting: Internal Medicine

## 2019-08-22 NOTE — Telephone Encounter (Signed)
Pt c/o medication issue:  1. Name of Medication: PRALUENT 150 MG/ML SOAJ  2. How are you currently taking this medication (dosage and times per day)? 1 dose of injection every 14 days  3. Are you having a reaction (difficulty breathing--STAT)? No  4. What is your medication issue? Patient is calling stating she received a letter stating she is no longer eligible to receive PRALUENT 150 MG/ML SOAJ free of charge and was advised to reach out to our office be reinstated. Pap # [id number]. Please advise.

## 2019-08-23 NOTE — Telephone Encounter (Signed)
Left detailed messages explained that Praluent patient assistance app was faxed 12/7 and that there is a new requirement of a $500 out of pocket Rx spend out for 2021 before app can be reviewed for approval. Also advised of [email or web address] application (under disease funds, hypercholesterolemia)

## 2019-08-24 MED ORDER — PRALUENT 150 MG/ML ~~LOC~~ SOAJ: 1.0000 | SUBCUTANEOUS | 3 refills | Status: DC

## 2019-08-24 NOTE — Addendum Note (Signed)
Addended by: Fidel Levy on: 08/24/2019 12:24 PM   Modules accepted: Orders

## 2019-08-24 NOTE — Telephone Encounter (Signed)
Spoke with patient about Praluent requirements. She voiced understanding. Explained that it may be best to pick up Rx from pharmacy before end of 2020 since a drug deductible will start in 2021. Rx(s) sent to pharmacy electronically. Explained process of [email or web address] application for which she will apply. No further assistance needed

## 2019-08-25 ENCOUNTER — Ambulatory Visit (AMBULATORY_SURGERY_CENTER): Payer: Medicare Other | Admitting: *Deleted

## 2019-08-25 ENCOUNTER — Other Ambulatory Visit: Payer: Self-pay

## 2019-08-25 VITALS — Temp 96.6°F | Ht 63.0 in | Wt 153.0 lb

## 2019-08-25 DIAGNOSIS — Z86010 Personal history of colonic polyps: Secondary | ICD-10-CM

## 2019-08-25 DIAGNOSIS — Z1159 Encounter for screening for other viral diseases: Secondary | ICD-10-CM

## 2019-08-25 MED ORDER — SUPREP BOWEL PREP KIT 17.5-3.13-1.6 GM/177ML PO SOLN: 1.0000 | Freq: Once | ORAL | 0 refills | Status: AC

## 2019-08-25 NOTE — Progress Notes (Signed)

## 2019-09-05 ENCOUNTER — Other Ambulatory Visit: Payer: Self-pay | Admitting: Internal Medicine

## 2019-09-05 ENCOUNTER — Ambulatory Visit (INDEPENDENT_AMBULATORY_CARE_PROVIDER_SITE_OTHER): Payer: Medicare Other

## 2019-09-05 DIAGNOSIS — E7849 Other hyperlipidemia: Secondary | ICD-10-CM

## 2019-09-05 DIAGNOSIS — Z1159 Encounter for screening for other viral diseases: Secondary | ICD-10-CM | POA: Diagnosis not present

## 2019-09-05 DIAGNOSIS — E785 Hyperlipidemia, unspecified: Secondary | ICD-10-CM

## 2019-09-06 LAB — SARS CORONAVIRUS 2 (TAT 6-24 HRS): SARS Coronavirus 2: NEGATIVE

## 2019-09-08 ENCOUNTER — Encounter: Payer: Self-pay | Admitting: Internal Medicine

## 2019-09-08 ENCOUNTER — Other Ambulatory Visit: Payer: Self-pay

## 2019-09-08 ENCOUNTER — Ambulatory Visit (AMBULATORY_SURGERY_CENTER): Payer: Medicare Other | Admitting: Internal Medicine

## 2019-09-08 VITALS — BP 150/87 | HR 87 | Temp 98.8°F | Resp 17 | Ht 63.0 in | Wt 153.0 lb

## 2019-09-08 DIAGNOSIS — I10 Essential (primary) hypertension: Secondary | ICD-10-CM | POA: Diagnosis not present

## 2019-09-08 DIAGNOSIS — R197 Diarrhea, unspecified: Secondary | ICD-10-CM | POA: Diagnosis not present

## 2019-09-08 DIAGNOSIS — Z86010 Personal history of colonic polyps: Secondary | ICD-10-CM | POA: Diagnosis not present

## 2019-09-08 DIAGNOSIS — I2510 Atherosclerotic heart disease of native coronary artery without angina pectoris: Secondary | ICD-10-CM | POA: Diagnosis not present

## 2019-09-08 DIAGNOSIS — E785 Hyperlipidemia, unspecified: Secondary | ICD-10-CM | POA: Diagnosis not present

## 2019-09-08 MED ORDER — SODIUM CHLORIDE 0.9 % IV SOLN: 500.0000 mL | Freq: Once | INTRAVENOUS | Status: DC

## 2019-09-08 NOTE — Patient Instructions (Signed)
Thank you for letting us take care of your healthcare needs today. Please see handout given to you on Diverticulosis.   YOU HAD AN ENDOSCOPIC PROCEDURE TODAY AT [facility]:   Refer to the procedure report that was given to you for any specific questions about what was found during the examination.  If the procedure report does not answer your questions, please call your gastroenterologist to clarify.  If you requested that your care partner not be given the details of your procedure findings, then the procedure report has been included in a sealed envelope for you to review at your convenience later.  YOU SHOULD EXPECT: Some feelings of bloating in the abdomen. Passage of more gas than usual.  Walking can help get rid of the air that was put into your GI tract during the procedure and reduce the bloating. If you had a lower endoscopy (such as a colonoscopy or flexible sigmoidoscopy) you may notice spotting of blood in your stool or on the toilet paper. If you underwent a bowel prep for your procedure, you may not have a normal bowel movement for a few days.  Please Note:  You might notice some irritation and congestion in your nose or some drainage.  This is from the oxygen used during your procedure.  There is no need for concern and it should clear up in a day or so.  SYMPTOMS TO REPORT IMMEDIATELY:   Following lower endoscopy (colonoscopy or flexible sigmoidoscopy):  Excessive amounts of blood in the stool  Significant tenderness or worsening of abdominal pains  Swelling of the abdomen that is new, acute  Fever of 100F or higher    For urgent or emergent issues, a gastroenterologist can be reached at any hour by calling [phone].   DIET:  We do recommend a small meal at first, but then you may proceed to your regular diet.  Drink plenty of fluids but you should avoid alcoholic beverages for 24 hours.  ACTIVITY:  You should plan to take it easy for the rest of  today and you should NOT DRIVE or use heavy machinery until tomorrow (because of the sedation medicines used during the test).    FOLLOW UP: Our staff will call the number listed on your records 48-72 hours following your procedure to check on you and address any questions or concerns that you may have regarding the information given to you following your procedure. If we do not reach you, we will leave a message.  We will attempt to reach you two times.  During this call, we will ask if you have developed any symptoms of COVID 19. If you develop any symptoms (ie: fever, flu-like symptoms, shortness of breath, cough etc.) before then, please call [phone].  If you test positive for Covid 19 in the 2 weeks post procedure, please call and report this information to [place].    If any biopsies were taken you will be contacted by phone or by letter within the next 1-3 weeks.  Please call us at [phone] if you have not heard about the biopsies in 3 weeks.    SIGNATURES/CONFIDENTIALITY: You and/or your care partner have signed paperwork which will be entered into your electronic medical record.  These signatures attest to the fact that that the information above on your After Visit Summary has been reviewed and is understood.  Full responsibility of the confidentiality of this discharge information lies with you and/or your care-partner.

## 2019-09-08 NOTE — Progress Notes (Signed)
Called to room to assist during endoscopic procedure.  Patient ID and intended procedure confirmed with present staff. Received instructions for my participation in the procedure from the performing physician.  

## 2019-09-08 NOTE — Op Note (Signed)
[place] Patient Name: [name] [name] Procedure Date: 09/08/2019 1:35 PM MRN: [id number] Endoscopist: Docia Chuck. Henrene Pastor , MD Age: 69 Referring MD:  Date of Birth: Mar 31, 1951 Gender: Female Account #: [id number] Procedure:                Colonoscopy with biopsies Indications:              High risk colon cancer surveillance: Personal                            history of non-advanced adenoma. Previous                            examinations 2007 [facility]); 2014. Also, incidental                            chronic diarrhea managed with Imodium Medicines:                Monitored Anesthesia Care Procedure:                Pre-Anesthesia Assessment:                           - Prior to the procedure, a History and Physical                            was performed, and patient medications and                            allergies were reviewed. The patient's tolerance of                            previous anesthesia was also reviewed. The risks                            and benefits of the procedure and the sedation                            options and risks were discussed with the patient.                            All questions were answered, and informed consent                            was obtained. Prior Anticoagulants: The patient has                            taken no previous anticoagulant or antiplatelet                            agents. ASA Grade Assessment: II - A patient with                            mild systemic disease. After reviewing the risks  and benefits, the patient was deemed in                            satisfactory condition to undergo the procedure.                           After obtaining informed consent, the colonoscope                            was passed under direct vision. Throughout the                            procedure, the patient's blood pressure, pulse, and                            oxygen  saturations were monitored continuously. The                            Colonoscope was introduced through the anus and                            advanced to the the cecum, identified by                            appendiceal orifice and ileocecal valve. The                            ileocecal valve, appendiceal orifice, and rectum                            were photographed. The quality of the bowel                            preparation was excellent. The colonoscopy was                            performed without difficulty. The patient tolerated                            the procedure well. The bowel preparation used was                            SUPREP via split dose instruction. Scope In: 1:51:10 PM Scope Out: 2:06:17 PM Scope Withdrawal Time: 0 hours 11 minutes 47 seconds  Total Procedure Duration: 0 hours 15 minutes 7 seconds  Findings:                 A few small-mouthed diverticula were found in the                            sigmoid colon.                           The entire examined colon appeared otherwise normal  on direct and retroflexion views. Biopsies for                            histology were taken with a cold forceps from the                            entire colon for evaluation of microscopic colitis. Complications:            No immediate complications. Estimated blood loss:                            None. Estimated Blood Loss:     Estimated blood loss: none. Impression:               - Diverticulosis in the sigmoid colon.                           - The entire examined colon is normal on direct and                            retroflexion views. Recommendation:           - Repeat colonoscopy in 7 years for surveillance.                           - Patient has a contact number available for                            emergencies. The signs and symptoms of potential                            delayed complications were discussed  with the                            patient. Return to normal activities tomorrow.                            Written discharge instructions were provided to the                            patient.                           - Resume previous diet.                           - Continue present medications.                           - Await pathology results. Docia Chuck. Henrene Pastor, MD 09/08/2019 2:19:24 PM This report has been signed electronically.

## 2019-09-08 NOTE — Progress Notes (Signed)
Report to PACU, RN, vss, BBS= Clear.  

## 2019-09-12 ENCOUNTER — Telehealth: Payer: Self-pay

## 2019-09-12 NOTE — Telephone Encounter (Signed)
1st follow up call made.  NALM 

## 2019-09-12 NOTE — Telephone Encounter (Signed)
  Follow up Call-  Call back number 09/08/2019  Post procedure Call Back phone  # [phone]  Permission to leave phone message Yes  Some recent data might be hidden     Patient questions:  Do you have a fever, pain , or abdominal swelling? No. Pain Score  0 *  Have you tolerated food without any problems? Yes.    Have you been able to return to your normal activities? Yes.    Do you have any questions about your discharge instructions: Diet   No. Medications  No. Follow up visit  No.  Do you have questions or concerns about your Care? No.  Actions: * If pain score is 4 or above: No action needed, pain <4.  Pt liked the surg-lub that Dr. Henrene Pastor used.  She said she had been using desitin the night before and she felt raw.  After the surg-lub was used she did not have any sx.  Maw  1. Have you developed a fever since your procedure? no  2.   Have you had an respiratory symptoms (SOB or cough) since your procedure? no  3.   Have you tested positive for COVID 19 since your procedure no  4.   Have you had any family members/close contacts diagnosed with the COVID 19 since your procedure?  no   If yes to any of these questions please route to Joylene John, RN and Alphonsa Gin, Therapist, sports.

## 2019-09-13 ENCOUNTER — Encounter: Payer: Self-pay | Admitting: Internal Medicine

## 2019-09-19 ENCOUNTER — Telehealth: Payer: Self-pay

## 2019-09-19 ENCOUNTER — Ambulatory Visit (INDEPENDENT_AMBULATORY_CARE_PROVIDER_SITE_OTHER): Payer: Medicare Other | Admitting: Internal Medicine

## 2019-09-19 DIAGNOSIS — M8949 Other hypertrophic osteoarthropathy, multiple sites: Secondary | ICD-10-CM | POA: Diagnosis not present

## 2019-09-19 DIAGNOSIS — M159 Polyosteoarthritis, unspecified: Secondary | ICD-10-CM

## 2019-09-19 MED ORDER — DICLOFENAC SODIUM 1 % EX GEL: 2.0000 g | Freq: Four times a day (QID) | CUTANEOUS | 1 refills | Status: DC | PRN

## 2019-09-19 NOTE — Progress Notes (Signed)
Subjective:    Patient ID: [name] [name], female    DOB: 06-02-1951, 69 y.o.   MRN: [id number]  DOS:  09/19/2019 Type of visit - description: Virtual Visit via Telephone  Attempted  to make this a video visit, due to technical difficulties from the patient side it was not possible  thus we proceeded with a Virtual Visit via Telephone    I connected with above mentioned patient  by telephone and verified that I am speaking with the correct person using two identifiers.  THIS ENCOUNTER IS A VIRTUAL VISIT DUE TO COVID-19 - PATIENT WAS NOT SEEN IN THE OFFICE. PATIENT HAS CONSENTED TO VIRTUAL VISIT / TELEMEDICINE VISIT   Location of patient: home  Location of provider: office  I discussed the limitations, risks, security and privacy concerns of performing an evaluation and management service by telephone and the availability of in person appointments. I also discussed with the patient that there may be a patient responsible charge related to this service. The patient expressed understanding and agreed to proceed.  Acute Since the last visit in November 2020, she is having more problems with arthritis. Reports R> L finger pain particularly in the mornings, they hurt whenever she tries to grab something or open a bottle.  She reports the fingers does not look red or swollen. Denies any fever chills No headaches No rash Admits to some stiffness in the hands, sx  last 1 or 2 hours after she wakes up.  Symptoms decreased to some extent with one OTC ibuprofen and wonders if she can take more.   Review of Systems See above   Past Medical History:  Diagnosis Date  . Anemia    duering cancer treatment   . Anxiety   . Arthritis    OA hands   . Bilateral lower extremity pain   . Blood transfusion without reported diagnosis    during chemo for lung cancer  . Cataract    forming  but very small   . Cholelithiasis   . Colon polyps    adenomatous  . High cholesterol   .  History of chemotherapy    for lung cancer- completed 2013  . Hx of radiation therapy 10/07/11 to 11/20/11   right lung  . Hx of radiation therapy 01/07/2012- 01/21/12   cranial irradiation  . Hypertension   . Lung cancer ([place]) 09/19/11   Stage III currently in remission  . Malignant melanoma of skin of canthus of right eye ([place]) 02/2017  . Osteoporosis 04/2014   Dexa scan by Dr. Corinna Capra, T-score -2.7, next in 2 years, with at least 13 % major fracture in 10 years  . Pitting edema    Bilateral lower extremities, duplex ultrasound 04/03/2015 normal, no DVT    Past Surgical History:  Procedure Laterality Date  . CHOLECYSTECTOMY  04/2011   biliary stent placement  . COLONOSCOPY    . [place]  2015  . MOHS SURGERY Right 02/2017   cheek and eyelid  . Josph Macho Touch     Vaginal procedure  . POLYPECTOMY    . TUBAL LIGATION          Objective:   Physical Exam There were no vitals taken for this visit. This is a virtual telephone visit, she is alert oriented x3, in no apparent distress.    Assessment      Assessment   HTN Hyperlipidemia -- cramps with Lipitor dc 2015 Depression . Anxiety -- on citalopram, rarely  uses Xanax Osteoporosis-- per DEXA at gyn 05-10-2014 --->  was on boniva, now evista  B12 deficiency dx 01/2014 Vit D  def  COPD: Mild last PFTs  CV: Extensive coronary artery calcifications Oncology: -Lung cancer SCC DX 2013, XRT chest and brain -melanoma 2018, R face, s/p Mohs Lower extremity edema [place] (-) for DVT 03-2015 Coronary calcifications per CT chest 6 2016 IBS, diarrhea predominant   on Imodium as needed; GI 2015-- rx questran, self d/c d/t constipation ASA intolerant (tinnitus) but ok  taking low-dose  PLAN: DJD: The patient has developed pain at the fingers over the last few months, worse on the right. Pain at the wrist is minimal, apparently no synovitis. She is aware of the limitations of telephone assessment of her symptoms. Her main  concern is to know if she can take more than 1 ibuprofen a day. Plan: Okay ibuprofen 2 tablets in the morning and 1 at night always with food to prevent GI side effects Also okay to alternate days: some days use ibuprofen and other days  Tylenol. Will send topical diclofenac to be used as needed Recommend to have a office visit in person for a more thorough exam possibly x-rays and labs, declined for now, she will call if above strategy is not working. COVID-19 vaccination: Multiple questions answered to the best of my ability    I discussed the assessment and treatment plan with the patient. The patient was provided an opportunity to ask questions and all were answered. The patient agreed with the plan and demonstrated an understanding of the instructions.   The patient was advised to call back or seek an in-person evaluation if the symptoms worsen or if the condition fails to improve as anticipated.  I provided 25 minutes of non-face-to-face time during this encounter.  Kathlene November, MD

## 2019-09-19 NOTE — Telephone Encounter (Signed)
Patient called in.

## 2019-09-20 ENCOUNTER — Encounter: Payer: Self-pay | Admitting: Internal Medicine

## 2019-09-20 DIAGNOSIS — M1990 Unspecified osteoarthritis, unspecified site: Secondary | ICD-10-CM | POA: Insufficient documentation

## 2019-09-20 NOTE — Assessment & Plan Note (Signed)
DJD: The patient has developed pain at the fingers over the last few months, worse on the right. Pain at the wrist is minimal, apparently no synovitis. She is aware of the limitations of telephone assessment of her symptoms. Her main concern is to know if she can take more than 1 ibuprofen a day. Plan: Okay ibuprofen 2 tablets in the morning and 1 at night always with food to prevent GI side effects Also okay to alternate days: some days use ibuprofen and other days  Tylenol. Will send topical diclofenac to be used as needed Recommend to have a office visit in person for a more thorough exam possibly x-rays and labs, declined for now, she will call if above strategy is not working. COVID-19 vaccination: Multiple questions answered to the best of my ability

## 2019-10-10 ENCOUNTER — Other Ambulatory Visit: Payer: Self-pay

## 2019-10-10 DIAGNOSIS — E785 Hyperlipidemia, unspecified: Secondary | ICD-10-CM

## 2019-10-10 MED ORDER — ROSUVASTATIN CALCIUM 40 MG PO TABS: 40.0000 mg | ORAL_TABLET | Freq: Every day | ORAL | 1 refills | Status: DC

## 2019-10-21 DIAGNOSIS — E7849 Other hyperlipidemia: Secondary | ICD-10-CM | POA: Diagnosis not present

## 2019-10-21 DIAGNOSIS — E785 Hyperlipidemia, unspecified: Secondary | ICD-10-CM | POA: Diagnosis not present

## 2019-10-21 LAB — LIPID PANEL
Chol/HDL Ratio: 1.9 ratio (ref 0.0–4.4)
Cholesterol, Total: 150 mg/dL (ref 100–199)
HDL: 80 mg/dL (ref >39)
LDL Chol Calc (NIH): 50 mg/dL (ref 0–99)
Triglycerides: 114 mg/dL (ref 0–149)
VLDL Cholesterol Cal: 20 mg/dL (ref 5–40)

## 2019-10-24 ENCOUNTER — Other Ambulatory Visit: Payer: Self-pay

## 2019-10-24 ENCOUNTER — Encounter: Payer: Self-pay | Admitting: Internal Medicine

## 2019-10-24 ENCOUNTER — Ambulatory Visit (INDEPENDENT_AMBULATORY_CARE_PROVIDER_SITE_OTHER): Payer: Medicare Other | Admitting: Internal Medicine

## 2019-10-24 VITALS — BP 136/84 | Temp 97.7°F | Ht 63.0 in | Wt 156.7 lb

## 2019-10-24 DIAGNOSIS — I2510 Atherosclerotic heart disease of native coronary artery without angina pectoris: Secondary | ICD-10-CM | POA: Diagnosis not present

## 2019-10-24 DIAGNOSIS — E7849 Other hyperlipidemia: Secondary | ICD-10-CM | POA: Diagnosis not present

## 2019-10-24 MED ORDER — PRALUENT 150 MG/ML ~~LOC~~ SOAJ: 1.0000 | SUBCUTANEOUS | 3 refills | Status: DC

## 2019-10-24 NOTE — Progress Notes (Signed)
Chief Complaint:  Follow-up dyslipidemia  Primary Care Physician: Colon Branch, MD  HPI:  [name] [name] is a 69 y.o. female who is being seen today for the evaluation of dyslipidemia and statin intolerance at the request of Colon Branch, MD.  [name] [name] is a pleasant 69 year old female with an unfortunate history of lung cancer in the past status post radiation therapy.  She also has hypertension and marked dyslipidemia.  She is currently treated on rosuvastatin 40 mg and ezetimibe 10 mg daily.  Her last lipid profile from January 2019 indicated total cholesterol 334, triglycerides 156, HDL 73 and LDL at 229.  While she does have a high HDL, I suspect that it is likely nonfunctional.  This is actually an atherogenic lipid profile.  In fact, a chest CT scan for follow-up of her lung malignancy and June 2018 demonstrated "dense coronary artery vascular calcifications".  This could be also related to radiation injury to the coronaries.  She does have a history of stroke in her father and A. fib in her mother.  She is not aware of her sister has any cardiovascular disease and she does have 2 children.  Neither of them have been screened for elevated cholesterol.  She also reports eating a very atherogenic diet.  She eats 1-2 eggs several days a week, shrimp, and chicken livers, amongst other foods.  She reports recently having problems with the diarrhea and saw Dr. Henrene Pastor, who prescribed her for Colestid.  This was for the tablets, but she reports also a history of difficulty swallowing tablets.  Based on the size of these tablets, she is not taking the medication.  03/25/2018  [name] [name] returns today for follow-up.  She is done very well at lowering her cholesterol.  She is made significant dietary changes and has been taking Colestid once daily (not twice daily) in addition to rosuvastatin 40 mg.  She is also on Zetia 10 mg daily.  Her LDL cholesterol has gone down from 229-133.  She  still not at goal given her extensive atherosclerosis of LDL less than 70.  Her last lipid profile was in April and will need to repeat that today.  07/01/2018  [name] [name] returns today for follow-up of dyslipidemia.  She is tolerating rosuvastatin 40 mg daily as well as ezetimibe 10 mg daily.  Unfortunately she has multivessel coronary artery calcification.  Her goal LDL is less than 70.  Most recently she had a lipid profile 3 months ago which showed total cholesterol 242, triglycerides 143, HDL 84 and LDL 129.  As mentioned she is a good candidate for PCSK9 inhibitor however when we applied for Repatha her insurance denied it.  It was recommended that Praluent may be the preferred agent therefore will go ahead and pursue that.  She would not be a candidate for the upcoming Vesalius trial.  04/14/2019  [name] [name] is seen today in follow-up in the lipid clinic.  Via telemedicine visit on December 06, 2018.  At that time she had prior authorization for Praluent but had not started taking it.  She was denied for patient assistance in the past program.  Ahead and get the prescription filled and start taking it.  She did do that and subsequently action in her cholesterol.  Total cholesterol has decreased from 273 down to 116.  Triglycerides are 98, HDL 82 and LDL cholesterol is improved from 159 down to 14.  Overall she loves the medicine and says that she is  tolerating it very well.  10/24/2019  [name] [name] returns today for follow-up.  I went ahead and discontinued her ezetimibe due to very low cholesterol.  She has had an expected increased from 116-150 total cholesterol, triglycerides 114, HDL 80 and LDL 50.  This still indicates excellent control.  She "loves" the Praluent.  She really enjoys giving herself shots.  In general her control has been excellent.  PMHx:  Past Medical History:  Diagnosis Date  . Anemia    duering cancer treatment   . Anxiety   . Arthritis    OA hands   .  Bilateral lower extremity pain   . Blood transfusion without reported diagnosis    during chemo for lung cancer  . Cataract    forming  but very small   . Cholelithiasis   . Colon polyps    adenomatous  . High cholesterol   . History of chemotherapy    for lung cancer- completed 2013  . Hx of radiation therapy 10/07/11 to 11/20/11   right lung  . Hx of radiation therapy 01/07/2012- 01/21/12   cranial irradiation  . Hypertension   . Lung cancer ([place]) 09/19/11   Stage III currently in remission  . Malignant melanoma of skin of canthus of right eye ([place]) 02/2017  . Osteoporosis 04/2014   Dexa scan by Dr. Corinna Capra, T-score -2.7, next in 2 years, with at least 13 % major fracture in 10 years  . Pitting edema    Bilateral lower extremities, duplex ultrasound 04/03/2015 normal, no DVT    Past Surgical History:  Procedure Laterality Date  . CHOLECYSTECTOMY  04/2011   biliary stent placement  . COLONOSCOPY    . [place]  2015  . MOHS SURGERY Right 02/2017   cheek and eyelid  . Josph Macho Touch     Vaginal procedure  . POLYPECTOMY    . TUBAL LIGATION      FAMHx:  Family History  Problem Relation Age of Onset  . Bladder Cancer Father   . Stroke Father   . Colon polyps Father   . Atrial fibrillation Mother   . Heart disease Mother        CHF  . Clotting disorder Mother        warfarin  . Colon polyps Mother   . Colon cancer Maternal Aunt 34  . Drug abuse Son   . Breast cancer Neg Hx   . Esophageal cancer Neg Hx   . Stomach cancer Neg Hx   . Rectal cancer Neg Hx     SOCHx:   reports that she quit smoking about 8 years ago. Her smoking use included cigarettes. She has never used smokeless tobacco. She reports current alcohol use. She reports that she does not use drugs.  ALLERGIES:  Allergies  Allergen Reactions  . Codeine Nausea And Vomiting  . Penicillins Rash    ROS: Pertinent items noted in HPI and remainder of comprehensive ROS otherwise  negative.  HOME MEDS: Current Outpatient Medications on File Prior to Visit  Medication Sig Dispense Refill  . ALPRAZolam (XANAX) 0.5 MG tablet Take 0.5 mg by mouth daily as needed for anxiety. Take 1-2 tablets by mouth daily as needed for anxiety    . aspirin EC 81 MG tablet Take 81 mg daily by mouth.    . Cholecalciferol (VITAMIN D3) 2000 UNITS capsule Take 4,000 Units by mouth daily.     . citalopram (CELEXA) 40 MG tablet Take 1 tablet (40  mg total) by mouth daily. 90 tablet 0  . Cyanocobalamin 2500 MCG CHEW Chew 2 tablets by mouth daily.    Marland Kitchen lisinopril (ZESTRIL) 20 MG tablet Take 1 tablet (20 mg total) by mouth daily. 90 tablet 0  . Loperamide HCl (IMODIUM PO) Take 3 mLs by mouth daily as needed. Reported on 02/07/2016    . PRALUENT 150 MG/ML SOAJ Inject 1 Dose into the skin every 14 (fourteen) days. 6 pen 3  . raloxifene (EVISTA) 60 MG tablet Take 60 mg by mouth daily.    . rosuvastatin (CRESTOR) 40 MG tablet Take 1 tablet (40 mg total) by mouth at bedtime. 90 tablet 1   No current facility-administered medications on file prior to visit.    LABS/IMAGING: No results found for this or any previous visit (from the past 48 hour(s)). No results found.  LIPID PANEL:    Component Value Date/Time   CHOL 150 10/21/2019 0859   TRIG 114 10/21/2019 0859   HDL 80 10/21/2019 0859   CHOLHDL 1.9 10/21/2019 0859   CHOLHDL 5 09/24/2017 0902   VLDL 31.2 09/24/2017 0902   LDLCALC 50 10/21/2019 0859   LDLDIRECT 282.7 12/03/2012 1138    WEIGHTS: Wt Readings from Last 3 Encounters:  10/24/19 156 lb 11.2 oz (71.1 kg)  09/08/19 153 lb (69.4 kg)  08/25/19 153 lb (69.4 kg)    VITALS: BP 136/84   Temp 97.7 F (36.5 C)   Ht 5\' 3"  (1.6 m)   Wt 156 lb 11.2 oz (71.1 kg)   BMI 27.76 kg/m   EXAM: Deferred  EKG: N/A  ASSESSMENT: 1. Extensive coronary artery calcification 2. Dyslipidemia not at goal LDL less than 70 3. Probable FH - Dutch score of 6 4. Atherogenic diet  PLAN: 1.    [name] [name] has had an excellent response to Praluent and her cholesterol has decreased significantly.  I stopped her ezetimibe and cholesterol has accordingly increased but minimally.  LDL is now 50.  She is well treated to target.  No changes in her medications.  Plan follow-up annually or sooner as necessary.  Pixie Casino, MD, [facility], [place] Director of the Advanced Lipid Disorders &  Cardiovascular Risk Reduction Clinic Diplomate of the American Board of Clinical Lipidology Attending Cardiologist  Direct Dial: [phone]  Fax: [phone]  Website:  [email or web address] 10/24/2019, 2:43 PM

## 2019-10-24 NOTE — Patient Instructions (Signed)
Medication Instructions:  Your physician recommends that you continue on your current medications as directed. Please refer to the Current Medication list given to you today.  *If you need a refill on your cardiac medications before your next appointment, please call your pharmacy*   Lab Work: FASTING lab work in 1 year - before next lipid clinic visit   If you have labs (blood work) drawn today and your tests are completely normal, you will receive your results only by: Marland Kitchen MyChart Message (if you have MyChart) OR . A paper copy in the mail If you have any lab test that is abnormal or we need to change your treatment, we will call you to review the results.   Testing/Procedures: NONE   Follow-Up: At [facility], you and your health needs are our priority.  As part of our continuing mission to provide you with exceptional heart care, we have created designated Provider Care Teams.  These Care Teams include your primary Cardiologist (physician) and Advanced Practice Providers (APPs -  Physician Assistants and Nurse Practitioners) who all work together to provide you with the care you need, when you need it.  We recommend signing up for the patient portal called "MyChart".  Sign up information is provided on this After Visit Summary.  MyChart is used to connect with patients for Virtual Visits (Telemedicine).  Patients are able to view lab/test results, encounter notes, upcoming appointments, etc.  Non-urgent messages can be sent to your provider as well.   To learn more about what you can do with MyChart, go to [email or web address].    Your next appointment:   12 month(s) - lipid clinic  The format for your next appointment:   In Person  Provider:   K. Mali Hilty, MD   Other Instructions

## 2019-11-30 DIAGNOSIS — H5203 Hypermetropia, bilateral: Secondary | ICD-10-CM | POA: Diagnosis not present

## 2019-11-30 DIAGNOSIS — H2513 Age-related nuclear cataract, bilateral: Secondary | ICD-10-CM | POA: Diagnosis not present

## 2019-11-30 DIAGNOSIS — H43812 Vitreous degeneration, left eye: Secondary | ICD-10-CM | POA: Diagnosis not present

## 2019-11-30 DIAGNOSIS — H10413 Chronic giant papillary conjunctivitis, bilateral: Secondary | ICD-10-CM | POA: Diagnosis not present

## 2019-12-06 ENCOUNTER — Telehealth: Payer: Self-pay | Admitting: Internal Medicine

## 2019-12-06 NOTE — Telephone Encounter (Signed)
New Message    Pt says she missed a call yesterday and is returning call     Please call back

## 2019-12-06 NOTE — Telephone Encounter (Signed)
LM for patient that there is not record where anyone called her and she is not due for ROV until 10/2020

## 2019-12-12 ENCOUNTER — Telehealth: Payer: Self-pay | Admitting: Internal Medicine

## 2019-12-12 NOTE — Telephone Encounter (Signed)
Patient's insurance [employer] prefers Repatha over [employer]. Unable to complete prior auth via [email or web address] due to this  Sent to MD to advise on med change

## 2019-12-13 NOTE — Telephone Encounter (Signed)
Ok to change to [place].  Dr Lemmie Evens

## 2019-12-13 NOTE — Telephone Encounter (Signed)
[name] [name] was calling to check on the status of the Prior Authorization. Please contact the resolution center at the number provided. Use Rx [id number] as reference

## 2019-12-14 MED ORDER — REPATHA SURECLICK 140 MG/ML ~~LOC~~ SOAJ: 1.0000 | SUBCUTANEOUS | 11 refills | Status: DC

## 2019-12-14 NOTE — Telephone Encounter (Signed)
PA for Repatha [employer] submitted via [facility] PA Case: [id number], Status: Approved, Coverage Starts on: 08/26/2019 12:00:00 AM, Coverage Ends on: 08/24/2020 12:00:00 AM

## 2019-12-14 NOTE — Telephone Encounter (Signed)
Spoke with patient about med change of which she is OK with  Rx sent to [employer] for [facility]

## 2019-12-26 ENCOUNTER — Telehealth: Payer: Self-pay | Admitting: Internal Medicine

## 2019-12-26 NOTE — Telephone Encounter (Signed)
Please advise 

## 2019-12-26 NOTE — Telephone Encounter (Signed)
Spoke w/ Pt- informed of recommendations. Pt verbalized understanding.  

## 2019-12-26 NOTE — Telephone Encounter (Signed)
For aches and pains: Tylenol  500 mg OTC 2 tabs a day every 8 hours as needed for pain  Also okay to alternate with ibuprofen 200 mg 2 tablets a day sporadically. Always take it with food because may cause gastritis and ulcers.  If you notice nausea, stomach pain, change in the color of stools --->  Stop the medicine and let us know  If severe symptoms needs to be seen.

## 2019-12-26 NOTE — Telephone Encounter (Signed)
CallerLerae [name]  Call Back # [phone]  Subject : Which over the counter medication to take  Patient states that she has been setting on her bottom for a year now and it hurts. Please ask doctor what over the counter medication is safe to take.  Please advise

## 2019-12-29 ENCOUNTER — Other Ambulatory Visit: Payer: Self-pay | Admitting: Internal Medicine

## 2019-12-29 MED ORDER — REPATHA SURECLICK 140 MG/ML ~~LOC~~ SOAJ: 1.0000 | SUBCUTANEOUS | 1 refills | Status: DC

## 2019-12-29 NOTE — Telephone Encounter (Signed)
  *  STAT* If patient is at the pharmacy, call can be transferred to refill team.   1. Which medications need to be refilled? (please list name of each medication and dose if known) Evolocumab (REPATHA SURECLICK) 128 MG/ML SOAJ  2. Which pharmacy/location (including street and city if local pharmacy) is medication to be sent to? [place], [place], [place], [place] [address]  3. Do they need a 30 day or 90 day supply? 1 Dose  Pt said to send her next dose to new pharmacy she preferred

## 2020-01-25 ENCOUNTER — Other Ambulatory Visit: Payer: Self-pay

## 2020-01-25 ENCOUNTER — Other Ambulatory Visit: Payer: Self-pay | Admitting: Internal Medicine

## 2020-01-25 MED ORDER — CITALOPRAM HYDROBROMIDE 40 MG PO TABS: 40.0000 mg | ORAL_TABLET | Freq: Every day | ORAL | 1 refills | Status: DC

## 2020-01-25 MED ORDER — REPATHA SURECLICK 140 MG/ML ~~LOC~~ SOAJ: 1.0000 | SUBCUTANEOUS | 11 refills | Status: DC

## 2020-02-02 ENCOUNTER — Telehealth: Payer: Self-pay | Admitting: Medical Oncology

## 2020-02-02 NOTE — Telephone Encounter (Signed)
Needs CT scan June 11th. I gave her number to call central scheduling.

## 2020-02-03 ENCOUNTER — Encounter (HOSPITAL_COMMUNITY): Payer: Self-pay

## 2020-02-03 ENCOUNTER — Other Ambulatory Visit: Payer: Self-pay

## 2020-02-03 ENCOUNTER — Ambulatory Visit (HOSPITAL_COMMUNITY)
Admission: RE | Admit: 2020-02-03 | Discharge: 2020-02-03 | Disposition: A | Discharge status: Home / Self Care | Payer: Medicare Other | Source: Ambulatory Visit | Attending: Internal Medicine | Admitting: Internal Medicine

## 2020-02-03 ENCOUNTER — Inpatient Hospital Stay: Payer: Medicare Other | Attending: Internal Medicine

## 2020-02-03 DIAGNOSIS — Z85118 Personal history of other malignant neoplasm of bronchus and lung: Secondary | ICD-10-CM | POA: Diagnosis not present

## 2020-02-03 DIAGNOSIS — I10 Essential (primary) hypertension: Secondary | ICD-10-CM | POA: Diagnosis not present

## 2020-02-03 DIAGNOSIS — Z923 Personal history of irradiation: Secondary | ICD-10-CM | POA: Diagnosis not present

## 2020-02-03 DIAGNOSIS — Z9221 Personal history of antineoplastic chemotherapy: Secondary | ICD-10-CM | POA: Diagnosis not present

## 2020-02-03 DIAGNOSIS — C3490 Malignant neoplasm of unspecified part of unspecified bronchus or lung: Secondary | ICD-10-CM

## 2020-02-03 DIAGNOSIS — Z79899 Other long term (current) drug therapy: Secondary | ICD-10-CM | POA: Insufficient documentation

## 2020-02-03 DIAGNOSIS — C3491 Malignant neoplasm of unspecified part of right bronchus or lung: Secondary | ICD-10-CM | POA: Diagnosis not present

## 2020-02-03 LAB — CBC WITH DIFFERENTIAL (CANCER CENTER ONLY)
Abs Immature Granulocytes: 0.02 10*3/uL (ref 0.00–0.07)
Basophils Absolute: 0.1 10*3/uL (ref 0.0–0.1)
Basophils Relative: 1 %
Eosinophils Absolute: 0.1 10*3/uL (ref 0.0–0.5)
Eosinophils Relative: 2 %
HCT: 40.3 % (ref 36.0–46.0)
Hemoglobin: 13.4 g/dL (ref 12.0–15.0)
Immature Granulocytes: 0 %
Lymphocytes Relative: 13 %
Lymphs Abs: 0.7 10*3/uL (ref 0.7–4.0)
MCH: 31.6 pg (ref 26.0–34.0)
MCHC: 33.3 g/dL (ref 30.0–36.0)
MCV: 95 fL (ref 80.0–100.0)
Monocytes Absolute: 0.6 10*3/uL (ref 0.1–1.0)
Monocytes Relative: 10 %
Neutro Abs: 4.2 10*3/uL (ref 1.7–7.7)
Neutrophils Relative %: 74 %
Platelet Count: 184 10*3/uL (ref 150–400)
RBC: 4.24 MIL/uL (ref 3.87–5.11)
RDW: 12.4 % (ref 11.5–15.5)
WBC Count: 5.7 10*3/uL (ref 4.0–10.5)
nRBC: 0 % (ref 0.0–0.2)

## 2020-02-03 LAB — CMP (CANCER CENTER ONLY)
ALT: 12 U/L (ref 0–44)
AST: 21 U/L (ref 15–41)
Albumin: 4 g/dL (ref 3.5–5.0)
Alkaline Phosphatase: 56 U/L (ref 38–126)
Anion gap: 12 (ref 5–15)
BUN: 12 mg/dL (ref 8–23)
CO2: 25 mmol/L (ref 22–32)
Calcium: 9.4 mg/dL (ref 8.9–10.3)
Chloride: 102 mmol/L (ref 98–111)
Creatinine: 1.12 mg/dL — ABNORMAL HIGH (ref 0.44–1.00)
GFR, Est AFR Am: 59 mL/min — ABNORMAL LOW (ref >60)
GFR, Estimated: 51 mL/min — ABNORMAL LOW (ref >60)
Glucose, Bld: 99 mg/dL (ref 70–99)
Potassium: 4.3 mmol/L (ref 3.5–5.1)
Sodium: 139 mmol/L (ref 135–145)
Total Bilirubin: 0.5 mg/dL (ref 0.3–1.2)
Total Protein: 7.1 g/dL (ref 6.5–8.1)

## 2020-02-03 MED ORDER — IOHEXOL 300 MG/ML  SOLN
75.0000 mL | Freq: Once | INTRAMUSCULAR | Status: AC | PRN
  Administered 2020-02-03: 75 mL via INTRAVENOUS

## 2020-02-03 MED ORDER — SODIUM CHLORIDE (PF) 0.9 % IJ SOLN
INTRAMUSCULAR | Status: AC
  Filled 2020-02-03: qty 50

## 2020-02-07 ENCOUNTER — Encounter: Payer: Self-pay | Admitting: Internal Medicine

## 2020-02-07 ENCOUNTER — Other Ambulatory Visit: Payer: Self-pay

## 2020-02-07 ENCOUNTER — Inpatient Hospital Stay (HOSPITAL_BASED_OUTPATIENT_CLINIC_OR_DEPARTMENT_OTHER): Payer: Medicare Other | Admitting: Internal Medicine

## 2020-02-07 ENCOUNTER — Telehealth: Payer: Self-pay | Admitting: Internal Medicine

## 2020-02-07 VITALS — BP 143/92 | HR 102 | Temp 97.7°F | Resp 20 | Ht 63.0 in | Wt 160.8 lb

## 2020-02-07 DIAGNOSIS — C3490 Malignant neoplasm of unspecified part of unspecified bronchus or lung: Secondary | ICD-10-CM | POA: Diagnosis not present

## 2020-02-07 DIAGNOSIS — Z85118 Personal history of other malignant neoplasm of bronchus and lung: Secondary | ICD-10-CM | POA: Diagnosis not present

## 2020-02-07 DIAGNOSIS — I10 Essential (primary) hypertension: Secondary | ICD-10-CM | POA: Diagnosis not present

## 2020-02-07 DIAGNOSIS — I2510 Atherosclerotic heart disease of native coronary artery without angina pectoris: Secondary | ICD-10-CM | POA: Diagnosis not present

## 2020-02-07 NOTE — Progress Notes (Signed)
[facility] Telephone:([phone]) [phone]   Fax:([phone]) Carrier, MD [address] [place] [address]  DIAGNOSIS: Limited stage small cell lung cancer diagnosed in January of 2013   PRIOR THERAPY:  1) Systemic chemotherapy with cisplatin at 60 mg per meter square given on day 1 and etoposide at 120 mg per meter square given on days one 2 and 3 with Neulasta support given on day 4 status post 4 cycles, last cycle was given on 12/02/2011 . This with concurrent with radiotherapy under the care of Dr. Lisbeth Renshaw.  2) prophylactic cranial irradiation under the care of Dr. Lisbeth Renshaw completed on 01/21/2012   CURRENT THERAPY: Observation.  INTERVAL HISTORY: [name] [name] 69 y.o. female returns to the clinic today for follow-up visit accompanied by her sister.  The patient is feeling fine today with no concerning complaints.  She denied having any chest pain, shortness of breath, cough or hemoptysis.  She denied having any fever or chills.  She has no nausea, vomiting, diarrhea or constipation.  She denied having any headache or visual changes.  She is here today for evaluation with repeat CT scan of the chest for restaging of her disease.  MEDICAL HISTORY: Past Medical History:  Diagnosis Date   Anemia    duering cancer treatment    Anxiety    Arthritis    OA hands    Bilateral lower extremity pain    Blood transfusion without reported diagnosis    during chemo for lung cancer   Cataract    forming  but very small    Cholelithiasis    Colon polyps    adenomatous   High cholesterol    History of chemotherapy    for lung cancer- completed 2013   Hx of radiation therapy 10/07/11 to 11/20/11   right lung   Hx of radiation therapy 01/07/2012- 01/21/12   cranial irradiation   Hypertension    Lung cancer ([place]) 09/19/11   Stage III currently in remission   Malignant melanoma of skin of canthus of right eye  ([place]) 02/2017   Osteoporosis 04/2014   Dexa scan by Dr. Corinna Capra, T-score -2.7, next in 2 years, with at least 13 % major fracture in 10 years   Pitting edema    Bilateral lower extremities, duplex ultrasound 04/03/2015 normal, no DVT    ALLERGIES:  is allergic to codeine and penicillins.  MEDICATIONS:  Current Outpatient Medications  Medication Sig Dispense Refill   ALPRAZolam (XANAX) 0.5 MG tablet Take 0.5 mg by mouth daily as needed for anxiety. Take 1-2 tablets by mouth daily as needed for anxiety     aspirin EC 81 MG tablet Take 81 mg daily by mouth.     Cholecalciferol (VITAMIN D3) 2000 UNITS capsule Take 4,000 Units by mouth daily.      citalopram (CELEXA) 40 MG tablet Take 1 tablet (40 mg total) by mouth daily. 90 tablet 1   Cyanocobalamin 2500 MCG CHEW Chew 2 tablets by mouth daily.     Evolocumab (REPATHA SURECLICK) 675 MG/ML SOAJ Inject 1 Dose into the skin every 14 (fourteen) days. 2 pen 11   lisinopril (ZESTRIL) 20 MG tablet Take 1 tablet (20 mg total) by mouth daily. 90 tablet 0   Loperamide HCl (IMODIUM PO) Take 3 mLs by mouth daily as needed. Reported on 02/07/2016     raloxifene (EVISTA) 60 MG tablet Take 60 mg by  mouth daily.     rosuvastatin (CRESTOR) 40 MG tablet Take 1 tablet (40 mg total) by mouth at bedtime. 90 tablet 1   No current facility-administered medications for this visit.    SURGICAL HISTORY:  Past Surgical History:  Procedure Laterality Date   CHOLECYSTECTOMY  04/2011   biliary stent placement   COLONOSCOPY     INCISIONAL HERNIA REPAIR  2015   MOHS SURGERY Right 02/2017   cheek and eyelid   Josph Macho Touch     Vaginal procedure   POLYPECTOMY     TUBAL LIGATION      REVIEW OF SYSTEMS:  A comprehensive review of systems was negative.   PHYSICAL EXAMINATION: General appearance: alert, cooperative and no distress Head: Normocephalic, without obvious abnormality, atraumatic Neck: no adenopathy Lymph nodes: Cervical, supraclavicular,  and axillary nodes normal. Resp: clear to auscultation bilaterally Back: symmetric, no curvature. ROM normal. No CVA tenderness. Cardio: regular rate and rhythm, S1, S2 normal, no murmur, click, rub or gallop GI: soft, non-tender; bowel sounds normal; no masses,  no organomegaly Extremities: extremities normal, atraumatic, no cyanosis or edema  ECOG PERFORMANCE STATUS: 0 - Asymptomatic  Blood pressure (!) 143/92, pulse (!) 102, temperature 97.7 F (36.5 C), temperature source Temporal, resp. rate 20, height 5\' 3"  (1.6 m), weight 160 lb 12.8 oz (72.9 kg), SpO2 99 %.  LABORATORY DATA: Lab Results  Component Value Date   WBC 5.7 02/03/2020   HGB 13.4 02/03/2020   HCT 40.3 02/03/2020   MCV 95.0 02/03/2020   PLT 184 02/03/2020      Chemistry      Component Value Date/Time   NA 139 02/03/2020 0731   NA 143 02/05/2017 0817   K 4.3 02/03/2020 0731   K 4.3 02/05/2017 0817   CL 102 02/03/2020 0731   CL 104 12/23/2012 1338   CO2 25 02/03/2020 0731   CO2 25 02/05/2017 0817   BUN 12 02/03/2020 0731   BUN 16.0 02/05/2017 0817   CREATININE 1.12 (H) 02/03/2020 0731   CREATININE 1.0 02/05/2017 0817      Component Value Date/Time   CALCIUM 9.4 02/03/2020 0731   CALCIUM 9.5 02/05/2017 0817   ALKPHOS 56 02/03/2020 0731   ALKPHOS 48 02/05/2017 0817   AST 21 02/03/2020 0731   AST 21 02/05/2017 0817   ALT 12 02/03/2020 0731   ALT 12 02/05/2017 0817   BILITOT 0.5 02/03/2020 0731   BILITOT 0.31 02/05/2017 0817       RADIOGRAPHIC STUDIES: CT Chest W Contrast  Result Date: 02/03/2020 CLINICAL DATA:  Primary Cancer Type: Lung Imaging Indication: Routine surveillance Interval therapy since last imaging? No Initial Cancer Diagnosis Date: 09/19/2011; Established by: Biopsy-proven Detailed Pathology: Stage IIIB small cell lung cancer Primary Tumor location: Right suprahilar mass Chemotherapy: Yes; Ongoing? No; Most recent administration: 12/02/2011 Immunotherapy?  Yes; Type: Neulasta;  Ongoing? No Radiation therapy? Yes; Date Range: 10/07/2011-11/20/2011; Target: Right lung Other Cancers: History of malignant melanoma EXAM: CT CHEST WITH CONTRAST TECHNIQUE: Multidetector CT imaging of the chest was performed during intravenous contrast administration. CONTRAST:  3mL OMNIPAQUE IOHEXOL 300 MG/ML  SOLN COMPARISON:  Most recent CT chest 16/07/2019.  05/13/2013 PET-CT. FINDINGS: Cardiovascular: Normal heart size. No significant pericardial effusion/thickening. Three-vessel coronary atherosclerosis. Atherosclerotic nonaneurysmal thoracic aorta. Normal caliber pulmonary arteries. No central pulmonary emboli. Mediastinum/Nodes: No discrete thyroid nodules. Unremarkable esophagus. No pathologically enlarged axillary, mediastinal or hilar lymph nodes. Lungs/Pleura: No pneumothorax. No pleural effusion. No acute consolidative airspace disease, lung masses or significant pulmonary  nodules. Stable sharply marginated mid to upper right perihilar consolidation with associated volume loss, distortion and bronchiectasis, compatible with radiation fibrosis. Upper abdomen: Small hiatal hernia.  Cholecystectomy. Musculoskeletal: No aggressive appearing focal osseous lesions. Chronic deformities in the posterior lower right ribs. Moderate thoracic spondylosis. IMPRESSION: 1. Stable right perihilar radiation fibrosis with no evidence of local tumor recurrence. 2. No findings of metastatic disease in the chest. 3.  Aortic Atherosclerosis (ICD10-I70.0). Electronically Signed   By: Ilona Sorrel M.D.   On: 02/03/2020 10:55   ASSESSMENT AND PLAN:  This is a very pleasant 69 years old white female with limited stage small cell lung cancer status post systemic chemotherapy with cisplatin and etoposide concurrent with radiation and followed by prophylactic cranial irradiation completed in May 2013. She has been on observation since that time and the patient is feeling fine today with no concerning complaints. She had  repeat CT scan of the chest performed recently.  I personally and independently reviewed the scans and discussed the results with the patient today. PET scan showed no concerning findings for disease recurrence or metastasis. I recommended for the patient to continue on observation with repeat CT scan of the chest in 1 year. For the hypertension she was advised to monitor her blood pressure closely at home and to report to her primary care physician for adjustment of her medication if needed. The patient was advised to call immediately if she has any concerning symptoms in the interval. The patient voices understanding of current disease status and treatment options and is in agreement with the current care plan. All questions were answered. The patient knows to call the clinic with any problems, questions or concerns. We can certainly see the patient much sooner if necessary.  Disclaimer: This note was dictated with voice recognition software. Similar sounding words can inadvertently be transcribed and may not be corrected upon review.

## 2020-02-07 NOTE — Telephone Encounter (Signed)
Scheduled per 06/15 los, patient has received calender and after visit summary.

## 2020-02-21 ENCOUNTER — Telehealth: Payer: Self-pay | Admitting: Internal Medicine

## 2020-02-21 NOTE — Telephone Encounter (Signed)
Spoke w/ Pt- informed of recommendations. Pt verbalized understanding.  

## 2020-02-21 NOTE — Telephone Encounter (Signed)
Tylenol  500 mg OTC 2 tabs a day every 8 hours as needed for pain  If that is not helping, she could try ibuprofen 200 mg 1 or 2 tablets twice a day always with food to prevent gastric irritation.  Call if pain not gradually better or if pain severe

## 2020-02-21 NOTE — Telephone Encounter (Signed)
Please advise 

## 2020-02-21 NOTE — Telephone Encounter (Signed)
Caller Seraphina Mitchner Call Back # [phone]  Patient states that she fell over vacuum cleaner and fell a hit crouch. Patient states busied rib cage, What can se take over the counter with the list of medication she on.   Please Advise

## 2020-03-13 ENCOUNTER — Encounter: Payer: Self-pay | Admitting: Internal Medicine

## 2020-03-13 ENCOUNTER — Ambulatory Visit (INDEPENDENT_AMBULATORY_CARE_PROVIDER_SITE_OTHER): Payer: Medicare Other | Admitting: Internal Medicine

## 2020-03-13 ENCOUNTER — Other Ambulatory Visit: Payer: Self-pay

## 2020-03-13 VITALS — BP 125/80 | HR 106 | Temp 98.6°F | Resp 16 | Ht 63.0 in | Wt 157.5 lb

## 2020-03-13 DIAGNOSIS — I10 Essential (primary) hypertension: Secondary | ICD-10-CM | POA: Diagnosis not present

## 2020-03-13 DIAGNOSIS — E785 Hyperlipidemia, unspecified: Secondary | ICD-10-CM | POA: Diagnosis not present

## 2020-03-13 DIAGNOSIS — S20212A Contusion of left front wall of thorax, initial encounter: Secondary | ICD-10-CM

## 2020-03-13 DIAGNOSIS — F418 Other specified anxiety disorders: Secondary | ICD-10-CM

## 2020-03-13 DIAGNOSIS — I2510 Atherosclerotic heart disease of native coronary artery without angina pectoris: Secondary | ICD-10-CM

## 2020-03-13 MED ORDER — SHINGRIX 50 MCG/0.5ML IM SUSR: 0.5000 mL | Freq: Once | INTRAMUSCULAR | 1 refills | Status: AC

## 2020-03-13 NOTE — Progress Notes (Signed)
Subjective:    Patient ID: [name] [name], female    DOB: 1950-08-26, 69 y.o.   MRN: [id number]  DOS:  03/13/2020 Type of visit - description: Routine checkup In general feeling well except for a mechanical fall approximately 3 weeks ago at home. She hurt her left side of the chest, having pain since then, improving over  the last week.  On Aleve as needed only.  Labs reviewed  Med list reviewed, she is not taking Crestor.  Review of Systems See above   Past Medical History:  Diagnosis Date  . Anemia    duering cancer treatment   . Anxiety   . Arthritis    OA hands   . Bilateral lower extremity pain   . Blood transfusion without reported diagnosis    during chemo for lung cancer  . Cataract    forming  but very small   . Cholelithiasis   . Colon polyps    adenomatous  . High cholesterol   . History of chemotherapy    for lung cancer- completed 2013  . Hx of radiation therapy 10/07/11 to 11/20/11   right lung  . Hx of radiation therapy 01/07/2012- 01/21/12   cranial irradiation  . Hypertension   . Lung cancer ([place]) 09/19/11   Stage III currently in remission  . Malignant melanoma of skin of canthus of right eye ([place]) 02/2017  . Osteoporosis 04/2014   Dexa scan by Dr. Corinna Capra, T-score -2.7, next in 2 years, with at least 13 % major fracture in 10 years  . Pitting edema    Bilateral lower extremities, duplex ultrasound 04/03/2015 normal, no DVT    Past Surgical History:  Procedure Laterality Date  . CHOLECYSTECTOMY  04/2011   biliary stent placement  . COLONOSCOPY    . [place]  2015  . MOHS SURGERY Right 02/2017   cheek and eyelid  . Josph Macho Touch     Vaginal procedure  . POLYPECTOMY    . TUBAL LIGATION      Allergies as of 03/13/2020      Reactions   Codeine Nausea And Vomiting   Penicillins Rash      Medication List       Accurate as of March 13, 2020 11:59 PM. If you have any questions, ask your nurse or doctor.          ALPRAZolam 0.5 MG tablet Commonly known as: XANAX Take 0.5 mg by mouth daily as needed for anxiety. Take 1-2 tablets by mouth daily as needed for anxiety   aspirin EC 81 MG tablet Take 81 mg daily by mouth.   citalopram 40 MG tablet Commonly known as: CELEXA Take 1 tablet (40 mg total) by mouth daily.   Cyanocobalamin 2500 MCG Chew Chew 2 tablets by mouth daily.   IMODIUM PO Take 3 mLs by mouth daily as needed. Reported on 02/07/2016   lisinopril 20 MG tablet Commonly known as: ZESTRIL Take 1 tablet (20 mg total) by mouth daily.   raloxifene 60 MG tablet Commonly known as: EVISTA Take 60 mg by mouth daily.   Repatha SureClick 509 MG/ML [place] Generic drug: Evolocumab Inject 1 Dose into the skin every 14 (fourteen) days.   rosuvastatin 40 MG tablet Commonly known as: CRESTOR Take 1 tablet (40 mg total) by mouth at bedtime.   Shingrix injection Generic drug: Zoster Vaccine Adjuvanted Inject 0.5 mLs into the muscle once for 1 dose. Started by: Kathlene November, MD   Vitamin  D3 50 MCG (2000 UT) capsule Take 4,000 Units by mouth daily.          Objective:   Physical Exam Chest:      BP 125/80 (BP Location: Right Arm)   Pulse (!) 106   Temp 98.6 F (37 C) (Oral)   Resp 16   Ht 5\' 3"  (1.6 m)   Wt 157 lb 8 oz (71.4 kg) Comment: Per Pt  SpO2 97%   BMI 27.90 kg/m  General:   Well developed, NAD, BMI noted. HEENT:  Normocephalic . Face symmetric, atraumatic Lungs:  CTA B Normal respiratory effort, no intercostal retractions, no accessory muscle use. Heart: RRR,  no murmur.  Lower extremities: no pretibial edema bilaterally  Skin: Not pale. Not jaundice Neurologic:  alert & oriented X3.  Speech normal, gait appropriate for age and unassisted Psych--  Cognition and judgment appear intact.  Cooperative with normal attention span and concentration.  Behavior appropriate. No anxious or depressed appearing.      Assessment     Assessment    HTN Hyperlipidemia -- cramps with Lipitor dc 2015 Depression . Anxiety -- on citalopram, rarely uses Xanax Osteoporosis-- DEXAs  at gyn --->  was on boniva, now evista  B12 deficiency dx 01/2014 Vit D  def  COPD: Mild last PFTs  CV: Extensive coronary artery calcifications Oncology: -Lung cancer SCC DX 2013, XRT chest and brain -melanoma 2018, R face, s/p Mohs Lower extremity edema [place] (-) for DVT 03-2015 Coronary calcifications per CT chest 6 2016 IBS, diarrhea predominant   on Imodium as needed; GI 2015-- rx questran, self d/c d/t constipation ASA intolerant (tinnitus) but ok  taking low-dose  PLAN: Chest wall contusion: New issue.  Fall happened 3 weeks ago, having pain on the area, gradually improving, on Aleve as needed.  Call if not completely back normal in ~  3 weeks HTN: On lisinopril, last BMP showed slightly increased creatinine, recommend good hydration, BP slightly elevated upon arrival, I recheck: 125/80.  No change, check ambulatory BPs. Hyperlipidemia: Excellent response to therapy, on Repatha but she is stopped Crestor, she was under the impression that that was  cardiology prescribed.  I am not sure, she will reach out to cardiology and clarify. Depression: Well-controlled Preventive care: Had Covid vaccinations, Shingrix Rx printed RTC 4 months CPX   This visit occurred during the SARS-CoV-2 public health emergency.  Safety protocols were in place, including screening questions prior to the visit, additional usage of staff PPE, and extensive cleaning of exam room while observing appropriate contact time as indicated for disinfecting solutions.

## 2020-03-13 NOTE — Progress Notes (Signed)
Pre visit review using our clinic review tool, if applicable. No additional management support is needed unless otherwise documented below in the visit note. 

## 2020-03-13 NOTE — Patient Instructions (Signed)
Check the  blood pressure 2 or 3 times a month   BP GOAL is between 110/65 and  135/85. If it is consistently higher or lower, let me know    [place], PLEASE SCHEDULE YOUR APPOINTMENTS Come back for a physical exam 06-2020

## 2020-03-14 ENCOUNTER — Telehealth: Payer: Self-pay | Admitting: Internal Medicine

## 2020-03-14 NOTE — Telephone Encounter (Signed)
Patient is calling to inquire about whether or not she should still be taking rosuvastatin (CRESTOR) 40 MG tablet medication. She states her PCP has concerns about her taking rosuvastatin (CRESTOR) 40 MG tablet and   Evolocumab (REPATHA SURECLICK) 720 MG/ML SOAJ  . Please advise.

## 2020-03-14 NOTE — Telephone Encounter (Signed)
Called and spoke with pt, notified that per Dr.Hilty's last office note it showed that he stopped her her zetia and not the rosuvastatin. Also reviewed [place] RPH's recommendations that she should continue her rosuvastatin and her repatha. Pt verbalized understanding with all instructions and was thankful for the call. No other questions at this time.

## 2020-03-14 NOTE — Telephone Encounter (Signed)
PCKS9i therapy was studied in addition to max tolerated statin therapy in clinical trials, there are no safety concerns with being on both. Ezetimibe was recently discontinued due to LDL of 14, follow up lipid panel in Feb 2021 showed LDL of 50.  She should continue her rosuvastatin 40mg  daily and her Repatha 140mg  Q2W (preferred over Praluent on formulary) as these are guideline recommended therapies. She requires both medications due to baseline LDL > 200.

## 2020-03-14 NOTE — Assessment & Plan Note (Signed)
Chest wall contusion: New issue.  Fall happened 3 weeks ago, having pain on the area, gradually improving, on Aleve as needed.  Call if not completely back normal in ~  3 weeks HTN: On lisinopril, last BMP showed slightly increased creatinine, recommend good hydration, BP slightly elevated upon arrival, I recheck: 125/80.  No change, check ambulatory BPs. Hyperlipidemia: Excellent response to therapy, on Repatha but she is stopped Crestor, she was under the impression that that was  cardiology prescribed.  I am not sure, she will reach out to cardiology and clarify. Depression: Well-controlled Preventive care: Had Covid vaccinations, Shingrix Rx printed RTC 4 months CPX

## 2020-04-02 ENCOUNTER — Other Ambulatory Visit: Payer: Self-pay

## 2020-04-03 ENCOUNTER — Other Ambulatory Visit: Payer: Self-pay

## 2020-04-03 MED ORDER — RALOXIFENE HCL 60 MG PO TABS: 60.0000 mg | ORAL_TABLET | Freq: Every day | ORAL | 3 refills | Status: AC

## 2020-04-03 MED ORDER — LISINOPRIL 20 MG PO TABS: 20.0000 mg | ORAL_TABLET | Freq: Every day | ORAL | 3 refills | Status: DC

## 2020-04-10 ENCOUNTER — Other Ambulatory Visit: Payer: Self-pay

## 2020-04-12 ENCOUNTER — Other Ambulatory Visit: Payer: Self-pay

## 2020-04-12 ENCOUNTER — Telehealth: Payer: Self-pay | Admitting: Internal Medicine

## 2020-04-12 DIAGNOSIS — E785 Hyperlipidemia, unspecified: Secondary | ICD-10-CM

## 2020-04-12 MED ORDER — ROSUVASTATIN CALCIUM 40 MG PO TABS: 40.0000 mg | ORAL_TABLET | Freq: Every day | ORAL | 1 refills | Status: DC

## 2020-04-12 NOTE — Telephone Encounter (Signed)
*  STAT* If patient is at the pharmacy, call can be transferred to refill team.   1. Which medications need to be refilled? (please list name of each medication and dose if known) Rosuvastatin 30 mg  2. Which pharmacy/location (including street and city if local pharmacy) is medication to be sent to? [facility] pharmacy  3. Do they need a 30 day or 90 day supply? [place]

## 2020-04-13 ENCOUNTER — Other Ambulatory Visit: Payer: Self-pay

## 2020-04-24 DIAGNOSIS — Z1152 Encounter for screening for COVID-19: Secondary | ICD-10-CM | POA: Diagnosis not present

## 2020-05-29 ENCOUNTER — Other Ambulatory Visit: Payer: Self-pay | Admitting: Obstetrics and Gynecology

## 2020-05-29 DIAGNOSIS — Z01419 Encounter for gynecological examination (general) (routine) without abnormal findings: Secondary | ICD-10-CM | POA: Diagnosis not present

## 2020-05-29 DIAGNOSIS — M8580 Other specified disorders of bone density and structure, unspecified site: Secondary | ICD-10-CM | POA: Diagnosis not present

## 2020-05-29 DIAGNOSIS — Z0000 Encounter for general adult medical examination without abnormal findings: Secondary | ICD-10-CM

## 2020-05-29 DIAGNOSIS — Z6828 Body mass index (BMI) 28.0-28.9, adult: Secondary | ICD-10-CM | POA: Diagnosis not present

## 2020-07-02 ENCOUNTER — Ambulatory Visit
Admission: RE | Admit: 2020-07-02 | Discharge: 2020-07-02 | Disposition: A | Discharge status: Home / Self Care | Payer: Medicare Other | Source: Ambulatory Visit | Attending: Obstetrics and Gynecology | Admitting: Obstetrics and Gynecology

## 2020-07-02 ENCOUNTER — Other Ambulatory Visit: Payer: Self-pay

## 2020-07-02 DIAGNOSIS — Z1231 Encounter for screening mammogram for malignant neoplasm of breast: Secondary | ICD-10-CM | POA: Diagnosis not present

## 2020-07-02 DIAGNOSIS — Z0000 Encounter for general adult medical examination without abnormal findings: Secondary | ICD-10-CM

## 2020-07-16 ENCOUNTER — Telehealth: Payer: Self-pay | Admitting: Internal Medicine

## 2020-07-16 NOTE — Telephone Encounter (Signed)
Spoke w/ Pt- informed of recommendations. Pt verbalized understanding.  

## 2020-07-16 NOTE — Telephone Encounter (Signed)
Please advise 

## 2020-07-16 NOTE — Telephone Encounter (Signed)
My recommendation is to get her Covid vaccine along with the flu shot. 4 to 5 weeks later proceed with Shingrix even if that is a slightly out of schedule.

## 2020-07-16 NOTE — Telephone Encounter (Signed)
Patient state it is time for her shingrix vaccine , covid booster and flu shot . Can patient get vaccine all in the same day/week .

## 2020-07-26 ENCOUNTER — Telehealth: Payer: Self-pay | Admitting: Internal Medicine

## 2020-07-26 NOTE — Telephone Encounter (Signed)
PA Case: [id number], Status: Approved, Coverage Starts on: 08/26/2019 12:00:00 AM, Coverage Ends on: 08/24/2021 12:00:00 AM

## 2020-07-26 NOTE — Telephone Encounter (Signed)
PA for repatha sureclick submitted via CMM (Key: [id number])

## 2020-07-30 ENCOUNTER — Inpatient Hospital Stay (HOSPITAL_COMMUNITY)
Admission: EM | Admit: 2020-07-30 | Discharge: 2020-08-03 | DRG: 536 | Disposition: A | Discharge status: Rehabilitation Facility | Payer: Medicare Other | Attending: Internal Medicine | Admitting: Internal Medicine

## 2020-07-30 ENCOUNTER — Emergency Department (HOSPITAL_COMMUNITY): Payer: Medicare Other

## 2020-07-30 ENCOUNTER — Other Ambulatory Visit: Payer: Self-pay

## 2020-07-30 ENCOUNTER — Encounter (HOSPITAL_COMMUNITY): Payer: Self-pay | Admitting: Emergency Medicine

## 2020-07-30 DIAGNOSIS — W19XXXD Unspecified fall, subsequent encounter: Secondary | ICD-10-CM | POA: Diagnosis present

## 2020-07-30 DIAGNOSIS — Z9049 Acquired absence of other specified parts of digestive tract: Secondary | ICD-10-CM | POA: Diagnosis not present

## 2020-07-30 DIAGNOSIS — Z23 Encounter for immunization: Secondary | ICD-10-CM | POA: Diagnosis not present

## 2020-07-30 DIAGNOSIS — Z885 Allergy status to narcotic agent status: Secondary | ICD-10-CM

## 2020-07-30 DIAGNOSIS — S3282XA Multiple fractures of pelvis without disruption of pelvic ring, initial encounter for closed fracture: Secondary | ICD-10-CM | POA: Diagnosis not present

## 2020-07-30 DIAGNOSIS — M25552 Pain in left hip: Secondary | ICD-10-CM | POA: Diagnosis not present

## 2020-07-30 DIAGNOSIS — Z87891 Personal history of nicotine dependence: Secondary | ICD-10-CM | POA: Diagnosis not present

## 2020-07-30 DIAGNOSIS — M810 Age-related osteoporosis without current pathological fracture: Secondary | ICD-10-CM | POA: Diagnosis present

## 2020-07-30 DIAGNOSIS — Z8371 Family history of colonic polyps: Secondary | ICD-10-CM

## 2020-07-30 DIAGNOSIS — Z880 Allergy status to penicillin: Secondary | ICD-10-CM

## 2020-07-30 DIAGNOSIS — Z8052 Family history of malignant neoplasm of bladder: Secondary | ICD-10-CM | POA: Diagnosis not present

## 2020-07-30 DIAGNOSIS — Z20822 Contact with and (suspected) exposure to covid-19: Secondary | ICD-10-CM | POA: Diagnosis present

## 2020-07-30 DIAGNOSIS — Z7982 Long term (current) use of aspirin: Secondary | ICD-10-CM | POA: Diagnosis not present

## 2020-07-30 DIAGNOSIS — S32512G Fracture of superior rim of left pubis, subsequent encounter for fracture with delayed healing: Secondary | ICD-10-CM | POA: Diagnosis not present

## 2020-07-30 DIAGNOSIS — Z832 Family history of diseases of the blood and blood-forming organs and certain disorders involving the immune mechanism: Secondary | ICD-10-CM | POA: Diagnosis not present

## 2020-07-30 DIAGNOSIS — Z823 Family history of stroke: Secondary | ICD-10-CM | POA: Diagnosis not present

## 2020-07-30 DIAGNOSIS — Z8249 Family history of ischemic heart disease and other diseases of the circulatory system: Secondary | ICD-10-CM

## 2020-07-30 DIAGNOSIS — Z8719 Personal history of other diseases of the digestive system: Secondary | ICD-10-CM

## 2020-07-30 DIAGNOSIS — W07XXXA Fall from chair, initial encounter: Secondary | ICD-10-CM | POA: Diagnosis present

## 2020-07-30 DIAGNOSIS — D62 Acute posthemorrhagic anemia: Secondary | ICD-10-CM | POA: Diagnosis not present

## 2020-07-30 DIAGNOSIS — R32 Unspecified urinary incontinence: Secondary | ICD-10-CM | POA: Diagnosis present

## 2020-07-30 DIAGNOSIS — Z85820 Personal history of malignant melanoma of skin: Secondary | ICD-10-CM | POA: Diagnosis not present

## 2020-07-30 DIAGNOSIS — S32512A Fracture of superior rim of left pubis, initial encounter for closed fracture: Secondary | ICD-10-CM | POA: Diagnosis not present

## 2020-07-30 DIAGNOSIS — M19041 Primary osteoarthritis, right hand: Secondary | ICD-10-CM | POA: Diagnosis present

## 2020-07-30 DIAGNOSIS — S32301S Unspecified fracture of right ilium, sequela: Secondary | ICD-10-CM | POA: Diagnosis not present

## 2020-07-30 DIAGNOSIS — Z9221 Personal history of antineoplastic chemotherapy: Secondary | ICD-10-CM | POA: Diagnosis not present

## 2020-07-30 DIAGNOSIS — Z923 Personal history of irradiation: Secondary | ICD-10-CM

## 2020-07-30 DIAGNOSIS — S32592A Other specified fracture of left pubis, initial encounter for closed fracture: Principal | ICD-10-CM | POA: Diagnosis present

## 2020-07-30 DIAGNOSIS — Z043 Encounter for examination and observation following other accident: Secondary | ICD-10-CM | POA: Diagnosis not present

## 2020-07-30 DIAGNOSIS — Z85118 Personal history of other malignant neoplasm of bronchus and lung: Secondary | ICD-10-CM | POA: Diagnosis not present

## 2020-07-30 DIAGNOSIS — F32A Depression, unspecified: Secondary | ICD-10-CM | POA: Diagnosis present

## 2020-07-30 DIAGNOSIS — E785 Hyperlipidemia, unspecified: Secondary | ICD-10-CM | POA: Diagnosis present

## 2020-07-30 DIAGNOSIS — E7800 Pure hypercholesterolemia, unspecified: Secondary | ICD-10-CM | POA: Diagnosis present

## 2020-07-30 DIAGNOSIS — F411 Generalized anxiety disorder: Secondary | ICD-10-CM | POA: Diagnosis present

## 2020-07-30 DIAGNOSIS — K5901 Slow transit constipation: Secondary | ICD-10-CM | POA: Diagnosis present

## 2020-07-30 DIAGNOSIS — N9489 Other specified conditions associated with female genital organs and menstrual cycle: Secondary | ICD-10-CM | POA: Diagnosis present

## 2020-07-30 DIAGNOSIS — M19042 Primary osteoarthritis, left hand: Secondary | ICD-10-CM | POA: Diagnosis present

## 2020-07-30 DIAGNOSIS — R102 Pelvic and perineal pain: Secondary | ICD-10-CM | POA: Diagnosis not present

## 2020-07-30 DIAGNOSIS — Y92000 Kitchen of unspecified non-institutional (private) residence as  the place of occurrence of the external cause: Secondary | ICD-10-CM

## 2020-07-30 DIAGNOSIS — I10 Essential (primary) hypertension: Secondary | ICD-10-CM | POA: Diagnosis present

## 2020-07-30 DIAGNOSIS — E7849 Other hyperlipidemia: Secondary | ICD-10-CM | POA: Diagnosis present

## 2020-07-30 DIAGNOSIS — W19XXXA Unspecified fall, initial encounter: Secondary | ICD-10-CM | POA: Diagnosis not present

## 2020-07-30 DIAGNOSIS — R000 Tachycardia, unspecified: Secondary | ICD-10-CM | POA: Diagnosis present

## 2020-07-30 DIAGNOSIS — R739 Hyperglycemia, unspecified: Secondary | ICD-10-CM | POA: Diagnosis present

## 2020-07-30 DIAGNOSIS — Z79899 Other long term (current) drug therapy: Secondary | ICD-10-CM

## 2020-07-30 DIAGNOSIS — S329XXS Fracture of unspecified parts of lumbosacral spine and pelvis, sequela: Secondary | ICD-10-CM | POA: Diagnosis not present

## 2020-07-30 DIAGNOSIS — Z800 Family history of malignant neoplasm of digestive organs: Secondary | ICD-10-CM

## 2020-07-30 DIAGNOSIS — R52 Pain, unspecified: Secondary | ICD-10-CM | POA: Diagnosis not present

## 2020-07-30 DIAGNOSIS — S32592G Other specified fracture of left pubis, subsequent encounter for fracture with delayed healing: Secondary | ICD-10-CM | POA: Diagnosis not present

## 2020-07-30 DIAGNOSIS — S32592D Other specified fracture of left pubis, subsequent encounter for fracture with routine healing: Secondary | ICD-10-CM | POA: Diagnosis not present

## 2020-07-30 DIAGNOSIS — J449 Chronic obstructive pulmonary disease, unspecified: Secondary | ICD-10-CM | POA: Diagnosis present

## 2020-07-30 LAB — CBC WITH DIFFERENTIAL/PLATELET
Abs Immature Granulocytes: 0.06 10*3/uL (ref 0.00–0.07)
Basophils Absolute: 0 10*3/uL (ref 0.0–0.1)
Basophils Relative: 0 %
Eosinophils Absolute: 0 10*3/uL (ref 0.0–0.5)
Eosinophils Relative: 0 %
HCT: 38.5 % (ref 36.0–46.0)
Hemoglobin: 12.6 g/dL (ref 12.0–15.0)
Immature Granulocytes: 1 %
Lymphocytes Relative: 4 %
Lymphs Abs: 0.4 10*3/uL — ABNORMAL LOW (ref 0.7–4.0)
MCH: 31.5 pg (ref 26.0–34.0)
MCHC: 32.7 g/dL (ref 30.0–36.0)
MCV: 96.3 fL (ref 80.0–100.0)
Monocytes Absolute: 0.7 10*3/uL (ref 0.1–1.0)
Monocytes Relative: 7 %
Neutro Abs: 8.6 10*3/uL — ABNORMAL HIGH (ref 1.7–7.7)
Neutrophils Relative %: 88 %
Platelets: 171 10*3/uL (ref 150–400)
RBC: 4 MIL/uL (ref 3.87–5.11)
RDW: 11.9 % (ref 11.5–15.5)
WBC: 9.8 10*3/uL (ref 4.0–10.5)
nRBC: 0 % (ref 0.0–0.2)

## 2020-07-30 LAB — BASIC METABOLIC PANEL
Anion gap: 11 (ref 5–15)
BUN: 13 mg/dL (ref 8–23)
CO2: 22 mmol/L (ref 22–32)
Calcium: 9.2 mg/dL (ref 8.9–10.3)
Chloride: 101 mmol/L (ref 98–111)
Creatinine, Ser: 1.01 mg/dL — ABNORMAL HIGH (ref 0.44–1.00)
GFR, Estimated: >60 mL/min (ref >60)
Glucose, Bld: 112 mg/dL — ABNORMAL HIGH (ref 70–99)
Potassium: 4.2 mmol/L (ref 3.5–5.1)
Sodium: 134 mmol/L — ABNORMAL LOW (ref 135–145)

## 2020-07-30 LAB — RESP PANEL BY RT-PCR (FLU A&B, COVID) ARPGX2
Influenza A by PCR: NEGATIVE
Influenza B by PCR: NEGATIVE
SARS Coronavirus 2 by RT PCR: NEGATIVE

## 2020-07-30 MED ORDER — ONDANSETRON HCL 4 MG/2ML IJ SOLN
4.0000 mg | Freq: Once | INTRAMUSCULAR | Status: AC | PRN
  Administered 2020-07-30: 4 mg via INTRAVENOUS
  Filled 2020-07-30: qty 2

## 2020-07-30 MED ORDER — NALOXONE HCL 0.4 MG/ML IJ SOLN: 0.4000 mg | INTRAMUSCULAR | Status: DC | PRN

## 2020-07-30 MED ORDER — ONDANSETRON 4 MG PO TBDP: 4.0000 mg | ORAL_TABLET | Freq: Once | ORAL | Status: DC | PRN

## 2020-07-30 MED ORDER — MORPHINE SULFATE (PF) 4 MG/ML IV SOLN
4.0000 mg | INTRAVENOUS | Status: DC | PRN
  Administered 2020-07-30: 4 mg via INTRAVENOUS
  Filled 2020-07-30 (×2): qty 1

## 2020-07-30 MED ORDER — ACETAMINOPHEN 325 MG PO TABS
650.0000 mg | ORAL_TABLET | Freq: Four times a day (QID) | ORAL | Status: DC | PRN
  Administered 2020-08-02 – 2020-08-03 (×2): 650 mg via ORAL
  Filled 2020-07-30 (×2): qty 2

## 2020-07-30 MED ORDER — VITAMIN D 25 MCG (1000 UNIT) PO TABS
4000.0000 [IU] | ORAL_TABLET | Freq: Every day | ORAL | Status: DC
  Administered 2020-07-31 – 2020-08-03 (×4): 4000 [IU] via ORAL
  Filled 2020-07-30 (×4): qty 4

## 2020-07-30 MED ORDER — ONDANSETRON HCL 4 MG/2ML IJ SOLN: 4.0000 mg | Freq: Four times a day (QID) | INTRAMUSCULAR | Status: DC | PRN

## 2020-07-30 MED ORDER — ACETAMINOPHEN 650 MG RE SUPP: 650.0000 mg | Freq: Four times a day (QID) | RECTAL | Status: DC | PRN

## 2020-07-30 MED ORDER — RALOXIFENE HCL 60 MG PO TABS
60.0000 mg | ORAL_TABLET | Freq: Every day | ORAL | Status: DC
  Administered 2020-07-31 – 2020-08-03 (×4): 60 mg via ORAL
  Filled 2020-07-30 (×4): qty 1

## 2020-07-30 MED ORDER — LISINOPRIL 20 MG PO TABS
20.0000 mg | ORAL_TABLET | Freq: Every day | ORAL | Status: DC
  Administered 2020-07-31: 20 mg via ORAL
  Filled 2020-07-30: qty 1

## 2020-07-30 MED ORDER — VITAMIN B-12 1000 MCG PO TABS
1000.0000 ug | ORAL_TABLET | Freq: Every day | ORAL | Status: DC
  Administered 2020-07-31 – 2020-08-03 (×4): 1000 ug via ORAL
  Filled 2020-07-30 (×4): qty 1

## 2020-07-30 MED ORDER — CITALOPRAM HYDROBROMIDE 20 MG PO TABS
40.0000 mg | ORAL_TABLET | Freq: Every day | ORAL | Status: DC
  Administered 2020-07-31: 40 mg via ORAL
  Filled 2020-07-30: qty 2

## 2020-07-30 MED ORDER — OXYCODONE HCL 5 MG PO TABS: 5.0000 mg | ORAL_TABLET | Freq: Once | ORAL | Status: DC

## 2020-07-30 MED ORDER — ROSUVASTATIN CALCIUM 20 MG PO TABS
40.0000 mg | ORAL_TABLET | Freq: Every day | ORAL | Status: DC
  Administered 2020-08-01 – 2020-08-03 (×3): 40 mg via ORAL
  Filled 2020-07-30 (×3): qty 2

## 2020-07-30 MED ORDER — MORPHINE SULFATE (PF) 4 MG/ML IV SOLN
4.0000 mg | Freq: Once | INTRAVENOUS | Status: AC
  Administered 2020-07-30: 4 mg via INTRAVENOUS
  Filled 2020-07-30: qty 1

## 2020-07-30 MED ORDER — ALPRAZOLAM 0.5 MG PO TABS: 0.5000 mg | ORAL_TABLET | Freq: Every day | ORAL | Status: DC | PRN

## 2020-07-30 NOTE — H&P (Signed)
History and Physical    PLEASE NOTE THAT DRAGON DICTATION SOFTWARE WAS USED IN THE CONSTRUCTION OF THIS NOTE.   [name] [name] YHC:[id number] DOB: 04-12-1951 DOA: 07/30/2020  PCP: Colon Branch, MD Patient coming from: home   I have personally briefly reviewed patient's old medical records in [place]  Chief Complaint: Left hip pain  HPI: [name] [name] is a 69 y.o. female with medical history significant for stage III acute lung cancer status post chemoradiation in 2013 currently in remission, hypertension, hyperlipidemia, generalized anxiety disorder who is admitted to [facility] on 07/30/2020 with acute fractures of the left superior and left inferior pubic rami without evidence of dislocation after presenting from home to [facility] Emergency Department complaining of left hip pain.   The patient reports development of acute left hip pain earlier this afternoon when falling off a stool in her kitchen while attempting to squat fruit flies.  In attempting to do so, the patient reports that she stepped too far, falling off of the stool and striking the ceramic tile floor, with the principal point of contact being her left hip.  She reports that she did not hit her head as a component of this fall, and denies any associated loss of consciousness.  She reports immediate development of sharp nonradiating left hip pain, which she reports is preventing her from being able to ambulate due to associated pain intolerance.  The left hip pain has been constant since the above fall, and worsens with any type of movement involving the left lower extremity.  Denies any associated presyncope or chest pain.  She is on a daily baby aspirin at home, but is otherwise on no blood thinning agents as an outpatient.  Aside from the presenting left hip pain, the patient denies any recent acute arthralgias or myalgias.  Denies any headache, neck stiffness, acute change in vision.    Denies any recent subjective fever, chills, rigors.  Denies any associated dysuria, gross hematuria, or urinary urgency/frequency.  She confirms a diagnosis of osteoporosis for which she is on vitamin D3 4000 units p.o. daily.  She lives by herself and is able to independently perform her own ADLs and at baseline able to ambulate without assistance.  Denies any recent rhinitis, rhinorrhea, sore throat, sob, wheezing, cough, nausea, vomiting, abdominal pain, diarrhea, or rash. No recent traveling or known COVID-19 exposures.     ED Course:  Vital signs in the ED were notable for the following: Temperature max 98.0, heart rate 105, blood pressure 126/87 - 145/65; respiratory rate 20, and oxygen saturation 98% on room air.  Labs were notable for the following: BMP notable for the following: Sodium 134, BUN 13, creatinine 1.01 relative to most recent prior creatinine data point of 1.12 in June 2021, glucose 112.  CBC notable for white blood cell count of 9800 and hemoglobin 12.6.  Screening nasopharyngeal COVID-19 PCR performed this evening was found to be negative.  Plain films of the pelvis showed evidence of fractures of the left superior and inferior pubic rami without evidence of dislocation.  Ensuing CT pelvis without contrast showed acute fractures of the left superior and left inferior pubic rami with small adjacent pelvic hematomas.  The patient's case was discussed with the on-call orthopedic surgeon, Dr. Percell Miller, who recommended admission to the hospital service for symptomatic management overnight, with plan for orthopedic surgery to evaluate the patient in the morning.  At this time, Dr. Percell Miller does not anticipate the  need for surgical intervention, and recommend symptomatic management overnight with the following additional recommendations: Weightbearing as tolerated with walker, and recommended consultation physical therapy.  While in the ED, the following were administered: Morphine 4  mg IV x1, Zofran 4 mg IV x1.    Review of Systems: As per HPI otherwise 10 point review of systems negative.   Past Medical History:  Diagnosis Date  . Anemia    duering cancer treatment   . Anxiety   . Arthritis    OA hands   . Bilateral lower extremity pain   . Blood transfusion without reported diagnosis    during chemo for lung cancer  . Cataract    forming  but very small   . Cholelithiasis   . Colon polyps    adenomatous  . High cholesterol   . History of chemotherapy    for lung cancer- completed 2013  . Hx of radiation therapy 10/07/11 to 11/20/11   right lung  . Hx of radiation therapy 01/07/2012- 01/21/12   cranial irradiation  . Hypertension   . Lung cancer ([place]) 09/19/11   Stage III currently in remission  . Malignant melanoma of skin of canthus of right eye ([place]) 02/2017  . Osteoporosis 04/2014   Dexa scan by Dr. Corinna Capra, T-score -2.7, next in 2 years, with at least 13 % major fracture in 10 years  . Pitting edema    Bilateral lower extremities, duplex ultrasound 04/03/2015 normal, no DVT    Past Surgical History:  Procedure Laterality Date  . CHOLECYSTECTOMY  04/2011   biliary stent placement  . COLONOSCOPY    . [place]  2015  . MOHS SURGERY Right 02/2017   cheek and eyelid  . Josph Macho Touch     Vaginal procedure  . POLYPECTOMY    . TUBAL LIGATION      Social History:  reports that she quit smoking about 8 years ago. Her smoking use included cigarettes. She has never used smokeless tobacco. She reports current alcohol use. She reports that she does not use drugs.   Allergies  Allergen Reactions  . Codeine Nausea And Vomiting  . Penicillins Rash    Family History  Problem Relation Age of Onset  . Bladder Cancer Father   . Stroke Father   . Colon polyps Father   . Atrial fibrillation Mother   . Heart disease Mother        CHF  . Clotting disorder Mother        warfarin  . Colon polyps Mother   . Colon cancer Maternal Aunt 58   . Drug abuse Son   . Breast cancer Neg Hx   . Esophageal cancer Neg Hx   . Stomach cancer Neg Hx   . Rectal cancer Neg Hx      Prior to Admission medications   Medication Sig Start Date End Date Taking? Authorizing Provider  ALPRAZolam Duanne Moron) 0.5 MG tablet Take 0.5 mg by mouth daily as needed for anxiety. Take 1-2 tablets by mouth daily as needed for anxiety    [provider]  aspirin EC 81 MG tablet Take 81 mg daily by mouth.    [provider]  Cholecalciferol (VITAMIN D3) 2000 UNITS capsule Take 4,000 Units by mouth daily.     [provider]  citalopram (CELEXA) 40 MG tablet Take 1 tablet (40 mg total) by mouth daily. 01/25/20   Colon Branch, MD  Cyanocobalamin 2500 MCG CHEW Chew 2 tablets  by mouth daily.    [provider]  Evolocumab (REPATHA SURECLICK) 277 MG/ML SOAJ Inject 1 Dose into the skin every 14 (fourteen) days. 01/25/20   Hilty, Nadean Corwin, MD  lisinopril (ZESTRIL) 20 MG tablet Take 1 tablet (20 mg total) by mouth daily. 04/03/20   Hilty, Nadean Corwin, MD  Loperamide HCl (IMODIUM PO) Take 3 mLs by mouth daily as needed. Reported on 02/07/2016 Patient not taking: Reported on 03/13/2020    [provider]  raloxifene (EVISTA) 60 MG tablet Take 1 tablet (60 mg total) by mouth daily. 04/03/20   Hilty, Nadean Corwin, MD  rosuvastatin (CRESTOR) 40 MG tablet Take 1 tablet (40 mg total) by mouth at bedtime. 04/12/20   Pixie Casino, MD     Objective    Physical Exam: Vitals:   07/30/20 1553 07/30/20 1850  BP: 126/87 (!) 145/65  Pulse: (!) 105   Resp: 20   Temp: 98 F (36.7 C)   TempSrc: Oral   SpO2: 98% 98%    General: appears to be stated age; alert, oriented Skin: warm, dry, no rash Head:  AT/[place] Mouth:  Oral mucosa membranes appear moist, normal dentition Neck: supple; trachea midline Heart:  RRR; did not appreciate any M/R/G Lungs: CTAB, did not appreciate any wheezes, rales, or rhonchi Abdomen: + BS; soft, ND, NT Vascular:  2+ pedal pulses b/l; 2+ radial pulses b/l Extremities: no peripheral edema, no muscle wasting Neuro: sensation intact in upper and lower extremities b/l; assessment of left lower extremity strength limited by pain intolerance.    Labs on Admission: I have personally reviewed following labs and imaging studies  CBC: Recent Labs  Lab 07/30/20 1900  WBC 9.8  NEUTROABS 8.6*  HGB 12.6  HCT 38.5  MCV 96.3  PLT 412   Basic Metabolic Panel: Recent Labs  Lab 07/30/20 1900  NA 134*  K 4.2  CL 101  CO2 22  GLUCOSE 112*  BUN 13  CREATININE 1.01*  CALCIUM 9.2   GFR: CrCl cannot be calculated (Unknown ideal weight.). Liver Function Tests: No results for input(s): AST, ALT, ALKPHOS, BILITOT, PROT, ALBUMIN in the last 168 hours. No results for input(s): LIPASE, AMYLASE in the last 168 hours. No results for input(s): AMMONIA in the last 168 hours. Coagulation Profile: No results for input(s): INR, PROTIME in the last 168 hours. Cardiac Enzymes: No results for input(s): CKTOTAL, CKMB, CKMBINDEX, TROPONINI in the last 168 hours. BNP (last 3 results) No results for input(s): PROBNP in the last 8760 hours. HbA1C: No results for input(s): HGBA1C in the last 72 hours. CBG: No results for input(s): GLUCAP in the last 168 hours. Lipid Profile: No results for input(s): CHOL, HDL, LDLCALC, TRIG, CHOLHDL, LDLDIRECT in the last 72 hours. Thyroid Function Tests: No results for input(s): TSH, T4TOTAL, FREET4, T3FREE, THYROIDAB in the last 72 hours. Anemia Panel: No results for input(s): VITAMINB12, FOLATE, FERRITIN, TIBC, IRON, RETICCTPCT in the last 72 hours. Urine analysis:    Component Value Date/Time   COLORURINE YELLOW 03/29/2012 2305   APPEARANCEUR CLEAR 03/29/2012 2305   LABSPEC 1.012 03/29/2012 2305   PHURINE 6.0 03/29/2012 2305   GLUCOSEU NEGATIVE 03/29/2012 2305   HGBUR NEGATIVE 03/29/2012 2305   BILIRUBINUR positive +1 04/30/2016 1413   KETONESUR NEGATIVE 03/29/2012 2305    PROTEINUR positive 04/30/2016 1413   PROTEINUR NEGATIVE 03/29/2012 2305   UROBILINOGEN 2.0 04/30/2016 1413   UROBILINOGEN 0.2 03/29/2012 2305   NITRITE positive 04/30/2016 1413   NITRITE NEGATIVE 03/29/2012 2305  LEUKOCYTESUR small (1+) (A) 04/30/2016 1413    Radiological Exams on Admission: DG Pelvis 1-2 Views  Result Date: 07/30/2020 CLINICAL DATA:  Status post fall. EXAM: PELVIS - 1-2 VIEW COMPARISON:  None. FINDINGS: Ill-defined fracture deformities are seen involving the superior and inferior pubic rami on the left. These are of indeterminate age. There is no evidence of dislocation. No pelvic bone lesions are seen. IMPRESSION: 1. Fractures of the left superior and inferior pubic rami of indeterminate age. CT correlation is recommended. Electronically Signed   By: Virgina Norfolk M.D.   On: 07/30/2020 16:55   CT PELVIS WO CONTRAST  Result Date: 07/30/2020 CLINICAL DATA:  Status post fall. EXAM: CT PELVIS WITHOUT CONTRAST TECHNIQUE: Multidetector CT imaging of the pelvis was performed following the standard protocol without intravenous contrast. COMPARISON:  None. FINDINGS: Urinary Tract:  No abnormality visualized. Bowel:  Unremarkable visualized pelvic bowel loops. Vascular/Lymphatic: There is mild calcification of the abdominal aorta and bilateral common iliac arteries, without evidence of aneurysmal dilatation. No pathologically enlarged lymph nodes. Reproductive:  No mass or other significant abnormality Other:  None. Musculoskeletal: Acute fracture deformities are seen involving the posteromedial aspect of the left superior pubic ramus and left inferior pubic ramus. A mild amount of adjacent pelvic inflammatory fat stranding is seen. This is separated from the urinary bladder by a thin fat plane. IMPRESSION: Acute fractures of the left superior and left inferior pubic rami with small adjacent pelvic hematomas. Aortic Atherosclerosis (ICD10-I70.0). Electronically Signed   By: Virgina Norfolk M.D.   On: 07/30/2020 18:53   DG FEMUR MIN 2 VIEWS LEFT  Result Date: 07/30/2020 CLINICAL DATA:  Status post fall. EXAM: LEFT FEMUR 2 VIEWS COMPARISON:  None. FINDINGS: Acute fracture deformities are seen involving the left superior and left inferior pubic rami. There is no evidence of dislocation. Soft tissues are unremarkable. IMPRESSION: Acute fracture of the left superior and left inferior pubic rami. Electronically Signed   By: Virgina Norfolk M.D.   On: 07/30/2020 16:56     Assessment/Plan   [name] [name] is a 69 y.o. female with medical history significant for stage III acute lung cancer status post chemoradiation in 2013 currently in remission, hypertension, hyperlipidemia, generalized anxiety disorder who is admitted to [facility] on 07/30/2020 with acute fractures of the left superior and left inferior pubic rami without evidence of dislocation after presenting from home to [facility] Emergency Department complaining of left hip pain.    Principal Problem:   Closed fracture of left inferior pubic ramus (HCC) Active Problems:   Dyslipidemia   Essential hypertension   Closed fracture of left superior pubic ramus (HCC)   Left hip pain   GAD (generalized anxiety disorder)    #) Acute left superior and left inferior pubic rami fractures: As a consequence of mechanical fall which occurred in the patient's kitchen earlier today in the absence of any associated loss of consciousness.  Did not hit head as a component of this fall.  Ensuing CT pelvis showed evidence of the above acute fractures in the absence of any evidence of dislocation.  Left lower extremity appears neurovascularly intact at this time.  Patient unable to ambulate due to associated pain intolerance at this time, which is relative to her baseline ambulatory status in which she is able to ambulate without assistance.  Of note, the patient carries a headache underlying diagnosis of  osteoporosis for which she is on daily vitamin D3 as an outpatient, as further  quantified above.  Clinically there is not appear to be any underlying source of infection, but will check urinalysis to evaluate for any contributory UTI.  The patient's case was discussed with the on-call orthopedic surgeon, Dr. Percell Miller, who recommended admission to the hospitalist service for conservative/symptomatic management overnight, with plan for orthopedic surgery to evaluate the patient in the morning.  At this time, Dr. Percell Miller does not anticipate the need for surgical intervention, and recommend symptomatic management overnight with the following additional recommendations: Weightbearing as tolerated with walker, and consultation of physical therapy.   Plan: Orthopedic surgery formally consulted, with plan for Dr. Percell Miller to evaluate the patient in the morning.  As needed IV morphine.  As needed Narcan.  As needed IV Zofran.  As recommended by orthopedic surgery, will make weightbearing as tolerated with walker.  Physical therapy consult has been placed for the morning.  Check urinalysis.  Check INR.  SCDs.  Check 25 hydroxy vitamin D level.  For now continue home dosing of vitamin D3.  Incentive spirometry.  Fall precautions ordered. CMP in morning to evaluate serum calcium level.    #) Mechanical fall: The patient reports a mechanical fall off a step stool at home earlier today from a height of approximately 1 foot without any associated loss of consciousness. Not a/w any presyncope, syncope, or chest pain.   Did not hit head as a component of the fall.  Presentation does not appear to be associate with any acute focal neurologic deficits, with the caveat of inability to evaluate strength of the left lower extremity due to associated pain intolerance.  Otherwise, left lower extremity is noted to be neurovascularly intact.  On daily baby aspirin as per sole blood thinning agent at home.   Plan: Check urinalysis, as  above.  CMP and CBC in the morning.  Fall precautions.  PT consult placed.  Work-up and management of associated acute pubic rami fractures, as above, including formal orthopedic surgery consult along with associated recommendations for weightbearing as tolerated with use of walker. Prn IV morphine, as above.      #) Essential hypertension: On lisinopril as outpatient and hypertensive agent.  Systolic blood pressures in the ED this evening in the range of 120's - 140's mmHg.   Plan: Continue home lisinopril. Closing monitoring of ensuing blood pressures, particularly given anticipation of as needed IV opioids, which the patient appears naive via routine vital signs.     #) Hyperlipidemia: On rosuvastatin and Q14 day injections of Evolocumab.   Plan: will continue on rosuvastatin during this hospitalization.     #) Generalized anxiety disorder: On Celexa as well as as needed Xanax at home.  Plan: Continue Celexa and as needed Xanax.    DVT prophylaxis: scd's  Code Status: Full code Family Communication: discussed patient's case with her sister, who was present at bedside. Disposition Plan: Per Rounding Team Consults called: case and imagine were discussed with the on-call orthopedic surgeon, Dr. Percell Miller, as further described above.   Admission status: inpatient; med-surg.  COVID-19 Vaccination status: s/p Moderna vaccine x 2 in Feb and March '21.     Of note, this patient was added by me to the following Admit List/Treatment Team: [email or web address]     PLEASE NOTE THAT DRAGON DICTATION SOFTWARE WAS USED IN THE CONSTRUCTION OF THIS NOTE.   Rhetta Mura DO [facility] Pager [phone] From [place]  07/30/2020, 7:58 PM

## 2020-07-30 NOTE — ED Provider Notes (Signed)
[facility] EMERGENCY DEPARTMENT Provider Note   CSN: [id number] Arrival date & time: 07/30/20  1545     History Chief Complaint  Patient presents with  . Fall    Martie Muhlbauer Tozer is a 69 y.o. female with a past medical history of lung cancer in remission, hypertension, who presents today for evaluation after a fall.  She reports that she was on a stepstool trying to use a fly's water to get fruit flies when she had a mechanical nonsyncopal fall landing on her left hip and thigh.  She reports she was unable to get up for about 1.5 to 2 hours.  At that point she relates she could put some weight on her right leg and was able to get help.  She denies striking her head or passing out.  Does not take any blood thinning medications.  No pain in her head, neck, back bilateral arms, legs, chest or abdomen.  She reports that the pain is mostly in her left-sided hip.  She has been unable to bear weight on that left-sided hip due to pain.  She has taken two tylenol PTA with out relief.   HPI     Past Medical History:  Diagnosis Date  . Anemia    duering cancer treatment   . Anxiety   . Arthritis    OA hands   . Bilateral lower extremity pain   . Blood transfusion without reported diagnosis    during chemo for lung cancer  . Cataract    forming  but very small   . Cholelithiasis   . Colon polyps    adenomatous  . High cholesterol   . History of chemotherapy    for lung cancer- completed 2013  . Hx of radiation therapy 10/07/11 to 11/20/11   right lung  . Hx of radiation therapy 01/07/2012- 01/21/12   cranial irradiation  . Hypertension   . Lung cancer ([place]) 09/19/11   Stage III currently in remission  . Malignant melanoma of skin of canthus of right eye ([place]) 02/2017  . Osteoporosis 04/2014   Dexa scan by Dr. Corinna Capra, T-score -2.7, next in 2 years, with at least 13 % major fracture in 10 years  . Pitting edema    Bilateral lower extremities, duplex ultrasound  04/03/2015 normal, no DVT    Patient Active Problem List   Diagnosis Date Noted  . Closed fracture of left inferior pubic ramus ([place]) 07/30/2020  . Closed fracture of left superior pubic ramus ([place]) 07/30/2020  . Left hip pain 07/30/2020  . GAD (generalized anxiety disorder) 07/30/2020  . DJD (degenerative joint disease) 09/20/2019  . Familial hyperlipidemia 07/01/2018  . Right shoulder pain 07/14/2017  . Malignant melanoma of skin of canthus of right eye ([place]) 03/16/2017  . PCP NOTES >>>>>>>>>>>>>>> 05/22/2015  . High coronary artery calcium score 02/14/2015  . Pain of lower leg 02/15/2014  . Hernia, abdominal 04/01/2013  . Depression with anxiety 04/01/2012  . Malignant neoplasm of upper lobe, bronchus or lung 09/30/2011  . H/O: lung cancer 09/25/2011  . Annual physical exam 09/05/2011  . IBS -diarrhea predominant 09/05/2011  . COPD (chronic obstructive pulmonary disease) ([place]) 09/05/2011  . Osteoporosis 03/01/2010  . TOBACCO ABUSE 11/14/2008  . Dyslipidemia 05/18/2007  . Essential hypertension 05/18/2007    Past Surgical History:  Procedure Laterality Date  . CHOLECYSTECTOMY  04/2011   biliary stent placement  . COLONOSCOPY    . [place]  2015  . MOHS  SURGERY Right 02/2017   cheek and eyelid  . Josph Macho Touch     Vaginal procedure  . POLYPECTOMY    . TUBAL LIGATION       OB History   No obstetric history on file.     Family History  Problem Relation Age of Onset  . Bladder Cancer Father   . Stroke Father   . Colon polyps Father   . Atrial fibrillation Mother   . Heart disease Mother        CHF  . Clotting disorder Mother        warfarin  . Colon polyps Mother   . Colon cancer Maternal Aunt 45  . Drug abuse Son   . Breast cancer Neg Hx   . Esophageal cancer Neg Hx   . Stomach cancer Neg Hx   . Rectal cancer Neg Hx     Social History   Tobacco Use  . Smoking status: Former Smoker    Types: Cigarettes    Quit date: 09/08/2011     Years since quitting: 8.8  . Smokeless tobacco: Never Used  Substance Use Topics  . Alcohol use: Yes    Comment: occasional  . Drug use: No    Home Medications Prior to Admission medications   Medication Sig Start Date End Date Taking? Authorizing Provider  ALPRAZolam Duanne Moron) 0.5 MG tablet Take 0.5 mg by mouth daily as needed for anxiety. Take 1-2 tablets by mouth daily as needed for anxiety    [provider]  aspirin EC 81 MG tablet Take 81 mg daily by mouth.    [provider]  Cholecalciferol (VITAMIN D3) 2000 UNITS capsule Take 4,000 Units by mouth daily.     [provider]  citalopram (CELEXA) 40 MG tablet Take 1 tablet (40 mg total) by mouth daily. 01/25/20   Colon Branch, MD  Cyanocobalamin 2500 MCG CHEW Chew 2 tablets by mouth daily.    [provider]  Evolocumab (REPATHA SURECLICK) 161 MG/ML SOAJ Inject 1 Dose into the skin every 14 (fourteen) days. 01/25/20   Hilty, Nadean Corwin, MD  lisinopril (ZESTRIL) 20 MG tablet Take 1 tablet (20 mg total) by mouth daily. 04/03/20   Hilty, Nadean Corwin, MD  Loperamide HCl (IMODIUM PO) Take 3 mLs by mouth daily as needed. Reported on 02/07/2016 Patient not taking: Reported on 03/13/2020    [provider]  raloxifene (EVISTA) 60 MG tablet Take 1 tablet (60 mg total) by mouth daily. 04/03/20   Hilty, Nadean Corwin, MD  rosuvastatin (CRESTOR) 40 MG tablet Take 1 tablet (40 mg total) by mouth at bedtime. 04/12/20   Hilty, Nadean Corwin, MD    Allergies    Codeine and Penicillins  Review of Systems   Review of Systems  Constitutional: Negative for chills and fever.  HENT: Negative for congestion.   Respiratory: Negative for cough and shortness of breath.   Cardiovascular: Negative for chest pain.  Gastrointestinal: Negative for abdominal pain.  Genitourinary: Negative for dysuria.  Musculoskeletal: Negative for back pain and neck pain.  Skin: Negative for color change and rash.  Neurological: Negative for  weakness, numbness and headaches.  Psychiatric/Behavioral: Negative for confusion.  All other systems reviewed and are negative.   Physical Exam Updated Vital Signs BP 131/88   Pulse (!) 101   Temp 98 F (36.7 C) (Oral)   Resp 13   Ht 5\' 3"  (1.6 m)   Wt 72.1 kg   SpO2 97%  BMI 28.17 kg/m   Physical Exam Vitals and nursing note reviewed.  Constitutional:      General: She is not in acute distress.    Appearance: She is well-developed. She is not diaphoretic.  HENT:     Head: Normocephalic and atraumatic.  Eyes:     General: No scleral icterus.       Right eye: No discharge.        Left eye: No discharge.     Conjunctiva/sclera: Conjunctivae normal.  Cardiovascular:     Rate and Rhythm: Normal rate and regular rhythm.     Pulses: Normal pulses.     Comments: 2+ DP/PT pulses bilaterally. Pulmonary:     Effort: Pulmonary effort is normal. No respiratory distress.     Breath sounds: No stridor.  Abdominal:     General: There is no distension.     Tenderness: There is no abdominal tenderness. There is no guarding.  Musculoskeletal:        General: No deformity.     Cervical back: Normal range of motion and neck supple.     Comments: Patient has significant pain in the left buttock with any movement of the left hip.  On initial exam patient was sitting in a chair limiting evaluation of leg length discrepancy.  No tenderness to palpation of right lower extremity, left lower extremity below the knee.  There is no midline C/T/L-spine tenderness to palpation, step-offs, or deformity.  Patient is able to turn her head past 45 degrees bilaterally without pain  Skin:    General: Skin is warm and dry.  Neurological:     Mental Status: She is alert.     Motor: No abnormal muscle tone.     Comments: Patient is awake and alert, answers questions appropriately.  Speech is not slurred.  She has intact sensation to light touch to bilateral lower and upper extremities.  She is able to  move her toes on her bilateral extremities.  Psychiatric:        Behavior: Behavior normal.     ED Results / Procedures / Treatments   Labs (all labs ordered are listed, but only abnormal results are displayed) Labs Reviewed  BASIC METABOLIC PANEL - Abnormal; Notable for the following components:      Result Value   Sodium 134 (*)    Glucose, Bld 112 (*)    Creatinine, Ser 1.01 (*)    All other components within normal limits  CBC WITH DIFFERENTIAL/PLATELET - Abnormal; Notable for the following components:   Neutro Abs 8.6 (*)    Lymphs Abs 0.4 (*)    All other components within normal limits  RESP PANEL BY RT-PCR (FLU A&B, COVID) ARPGX2  HIV ANTIBODY (ROUTINE TESTING W REFLEX)  URINALYSIS, ROUTINE W REFLEX MICROSCOPIC  MAGNESIUM  MAGNESIUM  COMPREHENSIVE METABOLIC PANEL  CBC  PROTIME-INR  VITAMIN D 25 HYDROXY (VIT D DEFICIENCY, FRACTURES)    EKG None  Radiology DG Pelvis 1-2 Views  Result Date: 07/30/2020 CLINICAL DATA:  Status post fall. EXAM: PELVIS - 1-2 VIEW COMPARISON:  None. FINDINGS: Ill-defined fracture deformities are seen involving the superior and inferior pubic rami on the left. These are of indeterminate age. There is no evidence of dislocation. No pelvic bone lesions are seen. IMPRESSION: 1. Fractures of the left superior and inferior pubic rami of indeterminate age. CT correlation is recommended. Electronically Signed   By: Virgina Norfolk M.D.   On: 07/30/2020 16:55   CT PELVIS WO CONTRAST  Result Date: 07/30/2020 CLINICAL DATA:  Status post fall. EXAM: CT PELVIS WITHOUT CONTRAST TECHNIQUE: Multidetector CT imaging of the pelvis was performed following the standard protocol without intravenous contrast. COMPARISON:  None. FINDINGS: Urinary Tract:  No abnormality visualized. Bowel:  Unremarkable visualized pelvic bowel loops. Vascular/Lymphatic: There is mild calcification of the abdominal aorta and bilateral common iliac arteries, without evidence of  aneurysmal dilatation. No pathologically enlarged lymph nodes. Reproductive:  No mass or other significant abnormality Other:  None. Musculoskeletal: Acute fracture deformities are seen involving the posteromedial aspect of the left superior pubic ramus and left inferior pubic ramus. A mild amount of adjacent pelvic inflammatory fat stranding is seen. This is separated from the urinary bladder by a thin fat plane. IMPRESSION: Acute fractures of the left superior and left inferior pubic rami with small adjacent pelvic hematomas. Aortic Atherosclerosis (ICD10-I70.0). Electronically Signed   By: Virgina Norfolk M.D.   On: 07/30/2020 18:53   DG FEMUR MIN 2 VIEWS LEFT  Result Date: 07/30/2020 CLINICAL DATA:  Status post fall. EXAM: LEFT FEMUR 2 VIEWS COMPARISON:  None. FINDINGS: Acute fracture deformities are seen involving the left superior and left inferior pubic rami. There is no evidence of dislocation. Soft tissues are unremarkable. IMPRESSION: Acute fracture of the left superior and left inferior pubic rami. Electronically Signed   By: Virgina Norfolk M.D.   On: 07/30/2020 16:56    Procedures Procedures (including critical care time)  Medications Ordered in ED Medications  morphine 4 MG/ML injection 4 mg (4 mg Intravenous Given 07/30/20 1859)  ondansetron (ZOFRAN) injection 4 mg (4 mg Intravenous Given 07/30/20 1853)    ED Course  I have reviewed the triage vital signs and the nursing notes.  Pertinent labs & imaging results that were available during my care of the patient were reviewed by me and considered in my medical decision making (see chart for details).  Clinical Course as of Jul 30 2156  Mon Jul 30, 2020  1719 RN made aware of plan, meds ordered and concern for fracture.    [EH]  1850 I spoke with radiologist, the state that patient does have inferior and superior pubic rami fractures.  There is a small amount of hematoma next to the bladder however there is a fat plane  separating this from the bladder and they report no concern for bladder or intra-abdominal injury.   [EH]  1936 I spoke with Dr. Percell Miller of [facility].  He reports that they will see the patient in the morning.  No surgery, weightbearing as tolerated, PT OT.     [EH]    Clinical Course User Index [EH] Ollen Gross   MDM Rules/Calculators/A&P                         Patient is a 69 year old woman who presents today for evaluation after mechanical nonsyncopal fall.  She denies striking her head or passing out.  On exam she has pain with movement of the left hip felt primarily in the left-sided buttocks.  She denies striking her head or passing out, does not take blood thinning medications.  No C-spine tenderness to palpation, she is neurovascularly intact, no evidence of significant intracranial injury or C-spine injury.  X-rays of the left hip and pelvis were obtained showing concern for left-sided inferior and superior rami fractures.  CT scan confirms these findings.  There is a small amount of hematoma however patient is not on blood thinners  and this does not appear to be clinically significant at this time.  No evidence of bladder injury.  I spoke with Dr. Percell Miller from orthopedics as patient will require admission for PT/OT, and pain control as she is currently unable to discharge home due to pain and mobility limitations.  Dr. Percell Miller states that they will see patient in the morning, weightbearing as tolerated and no indication for surgery currently.  I spoke with hospitalist who will see patient for admission.  Patient's pain was treated in the emergency room with morphine.  Note: Portions of this report may have been transcribed using voice recognition software. Every effort was made to ensure accuracy; however, inadvertent computerized transcription errors may be present Final Clinical Impression(s) / ED Diagnoses Final diagnoses:  Closed fracture of multiple pubic rami,  left, initial encounter [facility])  Fall, initial encounter    Rx / DC Orders ED Discharge Orders    None       Ollen Gross 07/30/20 2157    Tegeler, Gwenyth Allegra, MD 07/30/20 [phone]

## 2020-07-30 NOTE — ED Triage Notes (Signed)
Pt arrives from home after falling from a step stool about 2 ft and landed on left thigh. Pt could not get up for 2 hours until family arrived home. Denies LOC or hitting her head.

## 2020-07-30 NOTE — ED Notes (Signed)
[name] [name] sister would like and update

## 2020-07-31 LAB — COMPREHENSIVE METABOLIC PANEL
ALT: 19 U/L (ref 0–44)
AST: 25 U/L (ref 15–41)
Albumin: 3.4 g/dL — ABNORMAL LOW (ref 3.5–5.0)
Alkaline Phosphatase: 51 U/L (ref 38–126)
Anion gap: 10 (ref 5–15)
BUN: 11 mg/dL (ref 8–23)
CO2: 25 mmol/L (ref 22–32)
Calcium: 9 mg/dL (ref 8.9–10.3)
Chloride: 99 mmol/L (ref 98–111)
Creatinine, Ser: 0.93 mg/dL (ref 0.44–1.00)
GFR, Estimated: >60 mL/min (ref >60)
Glucose, Bld: 105 mg/dL — ABNORMAL HIGH (ref 70–99)
Potassium: 4.4 mmol/L (ref 3.5–5.1)
Sodium: 134 mmol/L — ABNORMAL LOW (ref 135–145)
Total Bilirubin: 0.8 mg/dL (ref 0.3–1.2)
Total Protein: 6.1 g/dL — ABNORMAL LOW (ref 6.5–8.1)

## 2020-07-31 LAB — PROTIME-INR
INR: 1 (ref 0.8–1.2)
Prothrombin Time: 12.5 seconds (ref 11.4–15.2)

## 2020-07-31 LAB — CBC
HCT: 36.4 % (ref 36.0–46.0)
Hemoglobin: 11.8 g/dL — ABNORMAL LOW (ref 12.0–15.0)
MCH: 31.2 pg (ref 26.0–34.0)
MCHC: 32.4 g/dL (ref 30.0–36.0)
MCV: 96.3 fL (ref 80.0–100.0)
Platelets: 149 10*3/uL — ABNORMAL LOW (ref 150–400)
RBC: 3.78 MIL/uL — ABNORMAL LOW (ref 3.87–5.11)
RDW: 11.9 % (ref 11.5–15.5)
WBC: 5.8 10*3/uL (ref 4.0–10.5)
nRBC: 0 % (ref 0.0–0.2)

## 2020-07-31 LAB — VITAMIN D 25 HYDROXY (VIT D DEFICIENCY, FRACTURES): Vit D, 25-Hydroxy: 51.45 ng/mL (ref 30–100)

## 2020-07-31 LAB — MAGNESIUM: Magnesium: 1.8 mg/dL (ref 1.7–2.4)

## 2020-07-31 LAB — HIV ANTIBODY (ROUTINE TESTING W REFLEX): HIV Screen 4th Generation wRfx: NONREACTIVE

## 2020-07-31 MED ORDER — CITALOPRAM HYDROBROMIDE 20 MG PO TABS
20.0000 mg | ORAL_TABLET | Freq: Every day | ORAL | Status: DC
  Administered 2020-08-01 – 2020-08-03 (×3): 20 mg via ORAL
  Filled 2020-07-31 (×3): qty 1

## 2020-07-31 MED ORDER — OXYCODONE HCL 5 MG PO TABS
5.0000 mg | ORAL_TABLET | ORAL | Status: DC | PRN
  Administered 2020-08-01 – 2020-08-03 (×8): 5 mg via ORAL
  Filled 2020-07-31 (×8): qty 1

## 2020-07-31 MED ORDER — LISINOPRIL 10 MG PO TABS
10.0000 mg | ORAL_TABLET | Freq: Every day | ORAL | Status: DC
  Administered 2020-08-02 – 2020-08-03 (×2): 10 mg via ORAL
  Filled 2020-07-31 (×3): qty 1

## 2020-07-31 MED ORDER — MORPHINE SULFATE (PF) 2 MG/ML IV SOLN
2.0000 mg | INTRAVENOUS | Status: DC | PRN
  Administered 2020-07-31 (×2): 2 mg via INTRAVENOUS
  Filled 2020-07-31 (×2): qty 1

## 2020-07-31 NOTE — Evaluation (Signed)
Physical Therapy Evaluation Patient Details Name: [name] [name] MRN: [id number] DOB: [birth year] Today's Date: 07/31/2020   History of Present Illness  Pt is a 69 y.o. female admitted 07/30/20 after fall onto L hip sustaining L superior and L inferior pubic rami fxs without evidence of dislocation. Per ortho consult, no need for surgical intervention, to be WBAT. PMH includes stage 3 lung CA s/p chemoradiation (2013), HTN, anxiety disorder.    Clinical Impression  Pt presents with an overall decrease in functional mobility secondary to above. PTA, pt independent, active and lives alone with dog; has a supportive sister nearby. Educ on BLE WBAT precautions, positioning, therex, and importance of mobility. Today, pt able to initiate transfer training with RW, requiring up to maxA due to significant difficulty putting weight through LLE in order to take steps. Pt would benefit from continued acute PT services to maximize functional mobility and independence prior to d/c with post-acute rehab services; pt in agreement.     Follow Up Recommendations CIR;Supervision for mobility/OOB (if CIR not an option, will need SNF)    Equipment Recommendations  Rolling walker with 5" wheels;3in1 (PT);Wheelchair (measurements PT);Wheelchair cushion (measurements PT)    Recommendations for Other Services Rehab consult;OT consult     Precautions / Restrictions Precautions Precautions: Fall;Other (comment) Precaution Comments: Urinary urgency/frequency Restrictions Weight Bearing Restrictions: Yes RLE Weight Bearing: Weight bearing as tolerated LLE Weight Bearing: Weight bearing as tolerated      Mobility  Bed Mobility Overal bed mobility: Needs Assistance Bed Mobility: Supine to Sit     Supine to sit: Mod assist     General bed mobility comments: Cues for sequencing and use of bed rail, modA for LLE management, trunk elevation and scooting hips to EOB with bed pad; pt distracted by pain  requiring frequent cues for direction/sequencing    Transfers Overall transfer level: Needs assistance Equipment used: Rolling walker (2 wheeled) Transfers: Sit to/from [facility] Sit to Stand: Mod assist Stand pivot transfers: Max assist       General transfer comment: Cues for sequencing and set-up with RW, modA for trunk elevation and stability; pt initiating a couple partial steps, but ultimately requiring maxA to complete stand pivot to recliner due to pain/difficulty taking steps  Ambulation/Gait     Assistive device: Rolling walker (2 wheeled)   Gait velocity: Decreased   General Gait Details: Mod-maxA to initiate steps, pt with significant difficulty accepting weight onto LLE in order to take step with R foot; could minimally slide both feet with assist for trunk and weight shifting, resulting in stand pivot to recliner  Stairs            Wheelchair Mobility    Modified Rankin (Stroke Patients Only)       Balance Overall balance assessment: Needs assistance Sitting-balance support: No upper extremity supported;Feet supported Sitting balance-Leahy Scale: Fair     Standing balance support: Bilateral upper extremity supported;During functional activity Standing balance-Leahy Scale: Poor Standing balance comment: Reliant on UE support and external assist                             Pertinent Vitals/Pain Pain Assessment: Faces Faces Pain Scale: Hurts whole lot Pain Location: L hip/pelvic area > RLE Pain Descriptors / Indicators: Discomfort;Grimacing;Guarding;Moaning Pain Intervention(s): Monitored during session;Limited activity within patient's tolerance;Patient requesting pain meds-RN notified    Home Living Family/patient expects to be discharged to:: Private residence Living Arrangements: Alone Available  Help at Discharge: Family;Available PRN/intermittently Type of Home: House (townhome) Home Access: Level entry      Home Layout: One level Home Equipment: Shower seat;Grab bars - tub/shower Additional Comments: Lives in accessible townhome with 1 dog. Sister lives 20-min away and can assist, but also has 5 dogs    Prior Function Level of Independence: Independent         Comments: Independent, active, does not work, enjoys time with Careers information officer        Extremity/Trunk Assessment   Upper Extremity Assessment Upper Extremity Assessment: Overall WFL for tasks assessed    Lower Extremity Assessment Lower Extremity Assessment: RLE deficits/detail;LLE deficits/detail RLE Deficits / Details: Functionally at least 3/5 throughout, limited by pelvic/hip pain RLE: Unable to fully assess due to pain LLE Deficits / Details: Hip strength functionally <3/5 throughout limited by pain; knee flex/ext at least 3/5 LLE: Unable to fully assess due to pain LLE Coordination: decreased gross motor       Communication   Communication: No difficulties  Cognition Arousal/Alertness: Awake/alert Behavior During Therapy: WFL for tasks assessed/performed Overall Cognitive Status: Within Functional Limits for tasks assessed                                 General Comments: Internally distracted by pain likely affecting attention, but otherwise [facility]      General Comments      Exercises     Assessment/Plan    PT Assessment Patient needs continued PT services  PT Problem List Decreased strength;Decreased range of motion;Decreased activity tolerance;Decreased balance;Decreased mobility;Decreased knowledge of use of DME;Decreased knowledge of precautions;Pain       PT Treatment Interventions DME instruction;Gait training;Stair training;Functional mobility training;Therapeutic activities;Therapeutic exercise;Balance training;Patient/family education;Wheelchair mobility training    PT Goals (Current goals can be found in the Care Plan section)  Acute Rehab PT Goals Patient Stated  Goal: Get back home to my dog PT Goal Formulation: With patient Time For Goal Achievement: 08/14/20 Potential to Achieve Goals: Good    Frequency Min 5X/week   Barriers to discharge        Co-evaluation               AM-PAC PT "6 Clicks" Mobility  Outcome Measure Help needed turning from your back to your side while in a flat bed without using bedrails?: A Lot Help needed moving from lying on your back to sitting on the side of a flat bed without using bedrails?: A Lot Help needed moving to and from a bed to a chair (including a wheelchair)?: A Lot Help needed standing up from a chair using your arms (e.g., wheelchair or bedside chair)?: A Lot Help needed to walk in hospital room?: A Lot Help needed climbing 3-5 steps with a railing? : Total 6 Click Score: 11    End of Session Equipment Utilized During Treatment: Gait belt Activity Tolerance: Patient tolerated treatment well;Patient limited by pain Patient left: in chair;with call bell/phone within reach;with chair alarm set Nurse Communication: Mobility status PT Visit Diagnosis: Other abnormalities of gait and mobility (R26.89);Muscle weakness (generalized) (M62.81)    Time: 8119-1478 PT Time Calculation (min) (ACUTE ONLY): 28 min   Charges:   PT Evaluation $PT Eval Moderate Complexity: 1 Mod PT Treatments $Therapeutic Activity: 8-22 mins   [name] [name], PT, DPT Acute Rehabilitation Services  Pager [phone] Office [phone]  Derry Lory 07/31/2020, 9:25 AM

## 2020-07-31 NOTE — Progress Notes (Signed)
PROGRESS NOTE    [name] [name]  VFI:[id number] DOB: 1951-02-24 DOA: 07/30/2020 PCP: Colon Branch, MD    Brief Narrative:  Mrs. Bloomfield was admitted to the hospital with the working diagnosis of acute left superior and left inferior pubic rami fractures.  69 year old female past medical history for stage III lung cancer status post chemoradiation 2013, currently in remission, she also has hypertension, dyslipidemia and generalized anxiety.  Patient sustained a mechanical fall from a stool, lost her balance and fell on the ceramic floor, landing on her left hip.  No head trauma.  She suffered immediate sharp, severe, and nonradiating left hip pain.  Unable to stand or ambulate.  On her initial physical examination she was afebrile, heart rate 105 bpm, blood pressure 126/87, respiratory rate 20, oxygen saturation 90% on room air.  Her lungs are clear to auscultation bilaterally, heart S1-S2, present rhythmic, abdomen soft, no lower extremity edema.  Radiographs from left femur with acute fracture of left superior and inferior pubic rami. Pelvic CT with acute fractures of the left superior and left inferior pubic rami with small adjacent pelvic hematomas.   Patient with severe pain, required intravenous morphine for pain control.  Orthopedics was consulted, recommended nonoperative supportive medical therapy.  Assessment & Plan:   Principal Problem:   Closed fracture of left inferior pubic ramus (HCC) Active Problems:   Dyslipidemia   Essential hypertension   Closed fracture of left superior pubic ramus (HCC)   Left hip pain   GAD (generalized anxiety disorder)   1. Acute left superior and inferior pubic rami fracture with severe pain and ambulatory dysfunction. Patient continue to have significant pain, that has required IV morphine, she is not at her baseline.  Continue pan control with IV morphine, oxycodone, will add celebrex and scheduled acetaminophen.  Continue  physical therapy and occupational therapy, plan for inpatient rehab consult.  Possible transfer to inpatient rehab.   2. HTN. Continue blood pressure control with lisinopril. Blood pressure systolic 416 mmHg, will decrease dose to 10 mg daily.   3. Dyslipidemia. Continue rosuvastatin.   4. Osteoporosis. Continue raloxifen per home regimen.   5. Depression and anxiety. Continue with citalopram and alprazolam.     Status is: Inpatient  Remains inpatient appropriate because:Inpatient level of care appropriate due to severity of illness   Dispo: The patient is from: Home              Anticipated d/c is to: CIR              Anticipated d/c date is: 2 days              Patient currently is not medically stable to d/c.   DVT prophylaxis: Enoxaparin   Code Status:   full  Family Communication:  I spoke with patient's sister at the bedside, we talked in detail about patient's condition, plan of care and prognosis and all questions were addressed.     Consultants:   Orthopedics    Subjective: Patient continue to have significant pain and ambulatory dysfunction, not back to baseline, no nausea or vomiting, no chest pain or dyspnea   Objective: Vitals:   07/31/20 0020 07/31/20 0228 07/31/20 0725 07/31/20 1325  BP: 129/85 116/77 119/82 120/81  Pulse: 93 96 90 85  Resp: 20 16 17 17   Temp: 98.8 F (37.1 C) 98.4 F (36.9 C) 98.1 F (36.7 C) 98.2 F (36.8 C)  TempSrc: Oral Oral Oral Oral  SpO2: 96% 93% 100%  100%  Weight:      Height:        Intake/Output Summary (Last 24 hours) at 07/31/2020 1554 Last data filed at 07/31/2020 0900 Gross per 24 hour  Intake 240 ml  Output --  Net 240 ml   Filed Weights   07/30/20 2007  Weight: 72.1 kg    Examination:   General: Not in pain or dyspnea, deconditioned  Neurology: Awake and alert, non focal  E ENT: no pallor, no icterus, oral mucosa moist Cardiovascular: No JVD. S1-S2 present, rhythmic, no gallops, rubs, or murmurs. No  lower extremity edema. Pulmonary: vesicular breath sounds bilaterally, adequate air movement, no wheezing, rhonchi or rales. Gastrointestinal. Abdomen soft and non tender Skin. No rashes Musculoskeletal: tender to palpation at the left hip, limited range of motion lower extremities due to pain.      Data Reviewed: I have personally reviewed following labs and imaging studies  CBC: Recent Labs  Lab 07/30/20 1900 07/31/20 0433  WBC 9.8 5.8  NEUTROABS 8.6*  --   HGB 12.6 11.8*  HCT 38.5 36.4  MCV 96.3 96.3  PLT 171 657*   Basic Metabolic Panel: Recent Labs  Lab 07/30/20 1900 07/31/20 0433  NA 134* 134*  K 4.2 4.4  CL 101 99  CO2 22 25  GLUCOSE 112* 105*  BUN 13 11  CREATININE 1.01* 0.93  CALCIUM 9.2 9.0  MG  --  1.8   GFR: Estimated Creatinine Clearance: 55.9 mL/min (by C-G formula based on SCr of 0.93 mg/dL). Liver Function Tests: Recent Labs  Lab 07/31/20 0433  AST 25  ALT 19  ALKPHOS 51  BILITOT 0.8  PROT 6.1*  ALBUMIN 3.4*   No results for input(s): LIPASE, AMYLASE in the last 168 hours. No results for input(s): AMMONIA in the last 168 hours. Coagulation Profile: Recent Labs  Lab 07/31/20 0433  INR 1.0   Cardiac Enzymes: No results for input(s): CKTOTAL, CKMB, CKMBINDEX, TROPONINI in the last 168 hours. BNP (last 3 results) No results for input(s): PROBNP in the last 8760 hours. HbA1C: No results for input(s): HGBA1C in the last 72 hours. CBG: No results for input(s): GLUCAP in the last 168 hours. Lipid Profile: No results for input(s): CHOL, HDL, LDLCALC, TRIG, CHOLHDL, LDLDIRECT in the last 72 hours. Thyroid Function Tests: No results for input(s): TSH, T4TOTAL, FREET4, T3FREE, THYROIDAB in the last 72 hours. Anemia Panel: No results for input(s): VITAMINB12, FOLATE, FERRITIN, TIBC, IRON, RETICCTPCT in the last 72 hours.    Radiology Studies: I have reviewed all of the imaging during this hospital visit personally     Scheduled  Meds: . cholecalciferol  4,000 Units Oral Daily  . [START ON 08/01/2020] citalopram  20 mg Oral Daily  . lisinopril  20 mg Oral Daily  . raloxifene  60 mg Oral Daily  . rosuvastatin  40 mg Oral QHS  . vitamin B-12  1,000 mcg Oral Daily   Continuous Infusions:   LOS: 1 day        Marian Meneely Gerome Apley, MD

## 2020-07-31 NOTE — Consult Note (Signed)
Reason for Consult:Pelvic fxs Referring Physician: Jerilynn Mages Arrien Time called: 0730 Time at bedside: 0845   [name] [name] is an 69 y.o. female.  HPI: Delsa was trying to kill and fruit fly in her kitchen. She jumped up to swat it and came down on a footstool which caused her to lose her balance and fall. She had immediate pelvic and left hip pain and could not get up. She was brought to the ED where imaging showed left superior and inferior pubic rami fxs and orthopedic surgery was consulted. She lives at home alone, does not use any assistive devices to ambulate, and is unemployed.  Past Medical History:  Diagnosis Date  . Anemia    duering cancer treatment   . Anxiety   . Arthritis    OA hands   . Bilateral lower extremity pain   . Blood transfusion without reported diagnosis    during chemo for lung cancer  . Cataract    forming  but very small   . Cholelithiasis   . Colon polyps    adenomatous  . High cholesterol   . History of chemotherapy    for lung cancer- completed 2013  . Hx of radiation therapy 10/07/11 to 11/20/11   right lung  . Hx of radiation therapy 01/07/2012- 01/21/12   cranial irradiation  . Hypertension   . Lung cancer ([place]) 09/19/11   Stage III currently in remission  . Malignant melanoma of skin of canthus of right eye ([place]) 02/2017  . Osteoporosis 04/2014   Dexa scan by Dr. Corinna Capra, T-score -2.7, next in 2 years, with at least 13 % major fracture in 10 years  . Pitting edema    Bilateral lower extremities, duplex ultrasound 04/03/2015 normal, no DVT    Past Surgical History:  Procedure Laterality Date  . CHOLECYSTECTOMY  04/2011   biliary stent placement  . COLONOSCOPY    . [place]  2015  . MOHS SURGERY Right 02/2017   cheek and eyelid  . Josph Macho Touch     Vaginal procedure  . POLYPECTOMY    . TUBAL LIGATION      Family History  Problem Relation Age of Onset  . Bladder Cancer Father   . Stroke Father   . Colon polyps  Father   . Atrial fibrillation Mother   . Heart disease Mother        CHF  . Clotting disorder Mother        warfarin  . Colon polyps Mother   . Colon cancer Maternal Aunt 90  . Drug abuse Son   . Breast cancer Neg Hx   . Esophageal cancer Neg Hx   . Stomach cancer Neg Hx   . Rectal cancer Neg Hx     Social History:  reports that she quit smoking about 8 years ago. Her smoking use included cigarettes. She has never used smokeless tobacco. She reports current alcohol use. She reports that she does not use drugs.  Allergies:  Allergies  Allergen Reactions  . Codeine Nausea And Vomiting  . Penicillins Rash    Medications: I have reviewed the patient's current medications.  Results for orders placed or performed during the hospital encounter of 07/30/20 (from the past 48 hour(s))  Basic metabolic panel     Status: Abnormal   Collection Time: 07/30/20  7:00 PM  Result Value Ref Range   Sodium 134 (L) 135 - 145 mmol/L   Potassium 4.2 3.5 - 5.1 mmol/L  Chloride 101 98 - 111 mmol/L   CO2 22 22 - 32 mmol/L   Glucose, Bld 112 (H) 70 - 99 mg/dL    Comment: Glucose reference range applies only to samples taken after fasting for at least 8 hours.   BUN 13 8 - 23 mg/dL   Creatinine, Ser 1.01 (H) 0.44 - 1.00 mg/dL   Calcium 9.2 8.9 - 10.3 mg/dL   GFR, Estimated >60 >60 mL/min    Comment: (NOTE) Calculated using the CKD-EPI Creatinine Equation (2021)    Anion gap 11 5 - 15    Comment: Performed at [facility] [address], [place], [place] [address]  CBC with Differential     Status: Abnormal   Collection Time: 07/30/20  7:00 PM  Result Value Ref Range   WBC 9.8 4.0 - 10.5 K/uL   RBC 4.00 3.87 - 5.11 MIL/uL   Hemoglobin 12.6 12.0 - 15.0 g/dL   HCT 38.5 36 - 46 %   MCV 96.3 80.0 - 100.0 fL   MCH 31.5 26.0 - 34.0 pg   MCHC 32.7 30.0 - 36.0 g/dL   RDW 11.9 11.5 - 15.5 %   Platelets 171 150 - 400 K/uL   nRBC 0.0 0.0 - 0.2 %   Neutrophils Relative % 88 %   Neutro Abs  8.6 (H) 1.7 - 7.7 K/uL   Lymphocytes Relative 4 %   Lymphs Abs 0.4 (L) 0.7 - 4.0 K/uL   Monocytes Relative 7 %   Monocytes Absolute 0.7 0.1 - 1.0 K/uL   Eosinophils Relative 0 %   Eosinophils Absolute 0.0 0.0 - 0.5 K/uL   Basophils Relative 0 %   Basophils Absolute 0.0 0.0 - 0.1 K/uL   Immature Granulocytes 1 %   Abs Immature Granulocytes 0.06 0.00 - 0.07 K/uL    Comment: Performed at [facility] [address], [place], [place] [address]  Resp Panel by RT-PCR (Flu A&B, Covid) Nasopharyngeal Swab     Status: None   Collection Time: 07/30/20  7:00 PM   Specimen: Nasopharyngeal Swab; Nasopharyngeal(NP) swabs in vial transport medium  Result Value Ref Range   SARS Coronavirus 2 by RT PCR NEGATIVE NEGATIVE    Comment: (NOTE) SARS-CoV-2 target nucleic acids are NOT DETECTED.  The SARS-CoV-2 RNA is generally detectable in upper respiratory specimens during the acute phase of infection. The lowest concentration of SARS-CoV-2 viral copies this assay can detect is 138 copies/mL. A negative result does not preclude SARS-Cov-2 infection and should not be used as the sole basis for treatment or other patient management decisions. A negative result may occur with  improper specimen collection/handling, submission of specimen other than nasopharyngeal swab, presence of viral mutation(s) within the areas targeted by this assay, and inadequate number of viral copies(<138 copies/mL). A negative result must be combined with clinical observations, patient history, and epidemiological information. The expected result is Negative.  Fact Sheet for Patients:  EntrepreneurPulse.com.au  Fact Sheet for Healthcare Providers:  IncredibleEmployment.be  This test is no t yet approved or cleared by the [place] FDA and  has been authorized for detection and/or diagnosis of SARS-CoV-2 by FDA under an Emergency Use Authorization (EUA). This EUA will remain  in  effect (meaning this test can be used) for the duration of the COVID-19 declaration under Section 564(b)(1) of the Act, 21 U.S.C.section 360bbb-3(b)(1), unless the authorization is terminated  or revoked sooner.       Influenza A by PCR NEGATIVE NEGATIVE  Influenza B by PCR NEGATIVE NEGATIVE    Comment: (NOTE) The Xpert Xpress SARS-CoV-2/FLU/RSV plus assay is intended as an aid in the diagnosis of influenza from Nasopharyngeal swab specimens and should not be used as a sole basis for treatment. Nasal washings and aspirates are unacceptable for Xpert Xpress SARS-CoV-2/FLU/RSV testing.  Fact Sheet for Patients: EntrepreneurPulse.com.au  Fact Sheet for Healthcare Providers: IncredibleEmployment.be  This test is not yet approved or cleared by the [place] FDA and has been authorized for detection and/or diagnosis of SARS-CoV-2 by FDA under an Emergency Use Authorization (EUA). This EUA will remain in effect (meaning this test can be used) for the duration of the COVID-19 declaration under Section 564(b)(1) of the Act, 21 U.S.C. section 360bbb-3(b)(1), unless the authorization is terminated or revoked.  Performed at [facility], [place] [address] [place], [place] [address]   Magnesium     Status: None   Collection Time: 07/31/20  4:33 AM  Result Value Ref Range   Magnesium 1.8 1.7 - 2.4 mg/dL    Comment: Performed at [facility] [address] [place], [place] [address]  Comprehensive metabolic panel     Status: Abnormal   Collection Time: 07/31/20  4:33 AM  Result Value Ref Range   Sodium 134 (L) 135 - 145 mmol/L   Potassium 4.4 3.5 - 5.1 mmol/L   Chloride 99 98 - 111 mmol/L   CO2 25 22 - 32 mmol/L   Glucose, Bld 105 (H) 70 - 99 mg/dL    Comment: Glucose reference range applies only to samples taken after fasting for at least 8 hours.   BUN 11 8 - 23 mg/dL   Creatinine, Ser 0.93 0.44 - 1.00 mg/dL   Calcium 9.0 8.9 -  10.3 mg/dL   Total Protein 6.1 (L) 6.5 - 8.1 g/dL   Albumin 3.4 (L) 3.5 - 5.0 g/dL   AST 25 15 - 41 U/L   ALT 19 0 - 44 U/L   Alkaline Phosphatase 51 38 - 126 U/L   Total Bilirubin 0.8 0.3 - 1.2 mg/dL   GFR, Estimated >60 >60 mL/min    Comment: (NOTE) Calculated using the CKD-EPI Creatinine Equation (2021)    Anion gap 10 5 - 15    Comment: Performed at [facility], [place] [address], [place], [place] [address]  CBC     Status: Abnormal   Collection Time: 07/31/20  4:33 AM  Result Value Ref Range   WBC 5.8 4.0 - 10.5 K/uL   RBC 3.78 (L) 3.87 - 5.11 MIL/uL   Hemoglobin 11.8 (L) 12.0 - 15.0 g/dL   HCT 36.4 36 - 46 %   MCV 96.3 80.0 - 100.0 fL   MCH 31.2 26.0 - 34.0 pg   MCHC 32.4 30.0 - 36.0 g/dL   RDW 11.9 11.5 - 15.5 %   Platelets 149 (L) 150 - 400 K/uL   nRBC 0.0 0.0 - 0.2 %    Comment: Performed at [facility], [place] [address], [place], [place] [address]  Protime-INR     Status: None   Collection Time: 07/31/20  4:33 AM  Result Value Ref Range   Prothrombin Time 12.5 11.4 - 15.2 seconds   INR 1.0 0.8 - 1.2    Comment: (NOTE) INR goal varies based on device and disease states. Performed at [facility], [place] [address], [place], [place] [address]     DG Pelvis 1-2 Views  Result Date:  07/30/2020 CLINICAL DATA:  Status post fall. EXAM: PELVIS - 1-2 VIEW COMPARISON:  None. FINDINGS: Ill-defined fracture deformities are seen involving the superior and inferior pubic rami on the left. These are of indeterminate age. There is no evidence of dislocation. No pelvic bone lesions are seen. IMPRESSION: 1. Fractures of the left superior and inferior pubic rami of indeterminate age. CT correlation is recommended. Electronically Signed   By: Virgina Norfolk M.D.   On: 07/30/2020 16:55   CT PELVIS WO CONTRAST  Result Date: 07/30/2020 CLINICAL DATA:  Status post fall. EXAM: CT PELVIS WITHOUT CONTRAST TECHNIQUE: Multidetector CT imaging of the pelvis was performed  following the standard protocol without intravenous contrast. COMPARISON:  None. FINDINGS: Urinary Tract:  No abnormality visualized. Bowel:  Unremarkable visualized pelvic bowel loops. Vascular/Lymphatic: There is mild calcification of the abdominal aorta and bilateral common iliac arteries, without evidence of aneurysmal dilatation. No pathologically enlarged lymph nodes. Reproductive:  No mass or other significant abnormality Other:  None. Musculoskeletal: Acute fracture deformities are seen involving the posteromedial aspect of the left superior pubic ramus and left inferior pubic ramus. A mild amount of adjacent pelvic inflammatory fat stranding is seen. This is separated from the urinary bladder by a thin fat plane. IMPRESSION: Acute fractures of the left superior and left inferior pubic rami with small adjacent pelvic hematomas. Aortic Atherosclerosis (ICD10-I70.0). Electronically Signed   By: Virgina Norfolk M.D.   On: 07/30/2020 18:53   DG FEMUR MIN 2 VIEWS LEFT  Result Date: 07/30/2020 CLINICAL DATA:  Status post fall. EXAM: LEFT FEMUR 2 VIEWS COMPARISON:  None. FINDINGS: Acute fracture deformities are seen involving the left superior and left inferior pubic rami. There is no evidence of dislocation. Soft tissues are unremarkable. IMPRESSION: Acute fracture of the left superior and left inferior pubic rami. Electronically Signed   By: Virgina Norfolk M.D.   On: 07/30/2020 16:56    Review of Systems  HENT: Negative for ear discharge, ear pain, hearing loss and tinnitus.   Eyes: Negative for photophobia and pain.  Respiratory: Negative for cough and shortness of breath.   Cardiovascular: Negative for chest pain.  Gastrointestinal: Negative for abdominal pain, nausea and vomiting.  Genitourinary: Negative for dysuria, flank pain, frequency and urgency.  Musculoskeletal: Positive for arthralgias (Left hip). Negative for back pain, myalgias and neck pain.  Neurological: Negative for dizziness  and headaches.  Hematological: Does not bruise/bleed easily.  Psychiatric/Behavioral: The patient is not nervous/anxious.    Blood pressure 119/82, pulse 90, temperature 98.1 F (36.7 C), temperature source Oral, resp. rate 17, height 5\' 3"  (1.6 m), weight 72.1 kg, SpO2 100 %. Physical Exam Constitutional:      General: She is not in acute distress.    Appearance: She is well-developed. She is not diaphoretic.  HENT:     Head: Normocephalic and atraumatic.  Eyes:     General: No scleral icterus.       Right eye: No discharge.        Left eye: No discharge.     Conjunctiva/sclera: Conjunctivae normal.  Cardiovascular:     Rate and Rhythm: Normal rate and regular rhythm.  Pulmonary:     Effort: Pulmonary effort is normal. No respiratory distress.  Musculoskeletal:     Cervical back: Normal range of motion.     Comments: BLE No traumatic wounds, ecchymosis, or rash  Mild TTP hip  No knee or ankle effusion  Knee stable to varus/ valgus and anterior/posterior stress  Sens DPN,  SPN, TN intact  Motor EHL, ext, flex, evers 5/5  DP 2+, PT 1+, No significant edema  Skin:    General: Skin is warm and dry.  Neurological:     Mental Status: She is alert.  Psychiatric:        Behavior: Behavior normal.     Assessment/Plan: Pubic rami fxs -- Should heal well without operative intervention. She may be WBAT BLE. F/u with Dr. Franki Monte in 2-3 weeks. Multiple medical problems including stage III acute lung cancer status post chemoradiation in 2013 currently in remission, hypertension, hyperlipidemia, and generalized anxiety disorder -- per primary service    Lisette Abu, PA-C Orthopedic Surgery [phone] 07/31/2020, 9:02 AM

## 2020-07-31 NOTE — Plan of Care (Signed)
  Problem: Education: Goal: Knowledge of General Education information will improve Description: Including pain rating scale, medication(s)/side effects and non-pharmacologic comfort measures Outcome: Progressing   Problem: Health Behavior/Discharge Planning: Goal: Ability to manage health-related needs will improve Outcome: Progressing   Problem: Activity: Goal: Risk for activity intolerance will decrease Outcome: Progressing   Problem: Elimination: Goal: Will not experience complications related to bowel motility Outcome: Progressing   Problem: Pain Managment: Goal: General experience of comfort will improve Outcome: Progressing

## 2020-07-31 NOTE — Progress Notes (Addendum)
Rehab Admissions Coordinator Note:  Per PT request, this patient was screened by Raechel Ache for appropriateness for an Inpatient Acute Rehab Consult.  At this time, the patient does not meet the medical necessity criteria to warrant an IP Rehab stay. AC will not pursue CIR consult for this patient.   Raechel Ache 07/31/2020, 9:26 AM  I can be reached at [phone].  ADDENDUM 4:24PM: Further discussed case with PM&R Medical Director Dr. Naaman Plummer. Felt pt does qualify for CIR at this time. Will place an IP Rehab consult order in the chart per protocol. An AC will follow up for further work up/assessment.   Raechel Ache, OTR/L  Rehab Admissions Coordinator  [phone] 07/31/2020 4:25 PM

## 2020-08-01 DIAGNOSIS — S32512G Fracture of superior rim of left pubis, subsequent encounter for fracture with delayed healing: Secondary | ICD-10-CM

## 2020-08-01 DIAGNOSIS — S32592G Other specified fracture of left pubis, subsequent encounter for fracture with delayed healing: Secondary | ICD-10-CM

## 2020-08-01 MED ORDER — SENNOSIDES-DOCUSATE SODIUM 8.6-50 MG PO TABS: 2.0000 | ORAL_TABLET | Freq: Every day | ORAL | Status: DC

## 2020-08-01 MED ORDER — DM-GUAIFENESIN ER 30-600 MG PO TB12: 1.0000 | ORAL_TABLET | Freq: Two times a day (BID) | ORAL | Status: DC | PRN

## 2020-08-01 MED ORDER — SENNOSIDES-DOCUSATE SODIUM 8.6-50 MG PO TABS
2.0000 | ORAL_TABLET | Freq: Two times a day (BID) | ORAL | Status: DC | PRN
  Administered 2020-08-01: 2 via ORAL
  Filled 2020-08-01: qty 2

## 2020-08-01 NOTE — Care Management (Signed)
    Durable Medical Equipment  (From admission, onward)         Start     Ordered   08/01/20 1209  For home use only DME 3 n 1  Once        08/01/20 1208   08/01/20 1209  For home use only DME Walker rolling  Once       Question Answer Comment  Walker: With [facility] Wheels   Patient needs a walker to treat with the following condition Pelvic fracture ([place])      08/01/20 1208   08/01/20 1208  For home use only DME lightweight manual wheelchair with seat cushion  Once       Comments: Patient suffers from pelvic rami fracture which impairs their ability to perform daily activities like [id number] in the home.  A walking [id number] will not resolve  issue with performing activities of daily living. A wheelchair will allow patient to safely perform daily activities. Patient is not able to propel themselves in the home using a standard weight wheelchair due to [id number]. Patient can self propel in the lightweight wheelchair. Length of need 3 months. Accessories: elevating leg rests (ELRs), wheel locks, extensions and anti-tippers.   08/01/20 1208

## 2020-08-01 NOTE — Progress Notes (Signed)
Inpatient Rehabilitation-Admissions Coordinator   Was notified the patient is in favor of CIR program. Confirmed DC support as needed from her sister. Will follow for bed availability. TOC team updated.   Raechel Ache, OTR/L  Rehab Admissions Coordinator  [phone] 08/01/2020 3:08 PM

## 2020-08-01 NOTE — Plan of Care (Signed)
  Problem: Education: Goal: Knowledge of General Education information will improve Description: Including pain rating scale, medication(s)/side effects and non-pharmacologic comfort measures Outcome: Progressing   Problem: Health Behavior/Discharge Planning: Goal: Ability to manage health-related needs will improve Outcome: Progressing   Problem: Activity: Goal: Risk for activity intolerance will decrease Outcome: Progressing   Problem: Nutrition: Goal: Adequate nutrition will be maintained Outcome: Progressing   Problem: Elimination: Goal: Will not experience complications related to bowel motility Outcome: Progressing   Problem: Pain Managment: Goal: General experience of comfort will improve Outcome: Progressing   Problem: Safety: Goal: Ability to remain free from injury will improve Outcome: Progressing

## 2020-08-01 NOTE — Plan of Care (Signed)

## 2020-08-01 NOTE — Progress Notes (Signed)
PROGRESS NOTE    [name] [name]  GYK:[id number] DOB: [birth year] DOA: 07/30/2020 PCP: Colon Branch, MD   Brief Narrative:  69 year old female with history of stage III lung cancer status post chemoradiation in remission, HTN, HLD, anxiety had a mechanical fall at home suffered from acute left superior inferior pubic rami fracture.  Orthopedic recommended conservative management.  PT recommended CIR.   Assessment & Plan:   Principal Problem:   Closed fracture of left inferior pubic ramus (HCC) Active Problems:   Dyslipidemia   Essential hypertension   Closed fracture of left superior pubic ramus (HCC)   Left hip pain   GAD (generalized anxiety disorder)  1. Acute left superior and inferior pubic rami fracture with severe pain and ambulatory dysfunction.  Patient's pain is now better controlled with current regimen therefore I explained her that she will continue taking Tylenol and oxycodone for mild to moderate/severe pain.  PT recommends CIR therefore arrangements being made.  2. HTN.  Continue lisinopril  3. Dyslipidemia. Continue rosuvastatin.   4. Osteoporosis. Continue raloxifen per home regimen.   5. Depression and anxiety. Continue with citalopram and alprazolam.     DVT prophylaxis: Lovenox Code Status: Full code Family Communication:    Status is: Inpatient  Remains inpatient appropriate because:Inpatient level of care appropriate due to severity of illness   Dispo: The patient is from: Home              Anticipated d/c is to: CIR              Anticipated d/c date is: 1 day              Patient currently is not medically stable to d/c.  Patient still having significant amount of pain, currently pain medications are being titrated in the meantime awaiting bed at CIR.       Body mass index is 28.17 kg/m.        Subjective: Tells me this morning she felt great after receiving oxycodone but afraid her pain will come back when she works  with therapy.  Review of Systems Otherwise negative except as per HPI, including: General: Denies fever, chills, night sweats or unintended weight loss. Resp: Denies cough, wheezing, shortness of breath. Cardiac: Denies chest pain, palpitations, orthopnea, paroxysmal nocturnal dyspnea. GI: Denies abdominal pain, nausea, vomiting, diarrhea or constipation GU: Denies dysuria, frequency, hesitancy or incontinence MS: Denies muscle aches, joint pain or swelling Neuro: Denies headache, neurologic deficits (focal weakness, numbness, tingling), abnormal gait Psych: Denies anxiety, depression, SI/HI/AVH Skin: Denies new rashes or lesions ID: Denies sick contacts, exotic exposures, travel  Examination:  General exam: Appears calm and comfortable  Respiratory system: Clear to auscultation. Respiratory effort normal. Cardiovascular system: S1 & S2 heard, RRR. No JVD, murmurs, rubs, gallops or clicks. No pedal edema. Gastrointestinal system: Abdomen is nondistended, soft and nontender. No organomegaly or masses felt. Normal bowel sounds heard. Central nervous system: Alert and oriented. No focal neurological deficits. Extremities: Symmetric 5 x 5 power. Skin: No rashes, lesions or ulcers Psychiatry: Judgement and insight appear normal. Mood & affect appropriate.     Objective: Vitals:   07/31/20 1325 07/31/20 1939 08/01/20 0500 08/01/20 0819  BP: 120/81 115/72 111/70 (!) 90/59  Pulse: 85 100 86 100  Resp: 17 17 17 19   Temp: 98.2 F (36.8 C) 98 F (36.7 C) 98.8 F (37.1 C) 98.1 F (36.7 C)  TempSrc: Oral Oral Oral Oral  SpO2: 100% 97% 98% 94%  Weight:  Height:        Intake/Output Summary (Last 24 hours) at 08/01/2020 1144 Last data filed at 08/01/2020 0500 Gross per 24 hour  Intake 480 ml  Output 1700 ml  Net -1220 ml   Filed Weights   07/30/20 2007  Weight: 72.1 kg     Data Reviewed:   CBC: Recent Labs  Lab 07/30/20 1900 07/31/20 0433  WBC 9.8 5.8  NEUTROABS  8.6*  --   HGB 12.6 11.8*  HCT 38.5 36.4  MCV 96.3 96.3  PLT 171 702*   Basic Metabolic Panel: Recent Labs  Lab 07/30/20 1900 07/31/20 0433  NA 134* 134*  K 4.2 4.4  CL 101 99  CO2 22 25  GLUCOSE 112* 105*  BUN 13 11  CREATININE 1.01* 0.93  CALCIUM 9.2 9.0  MG  --  1.8   GFR: Estimated Creatinine Clearance: 55.9 mL/min (by C-G formula based on SCr of 0.93 mg/dL). Liver Function Tests: Recent Labs  Lab 07/31/20 0433  AST 25  ALT 19  ALKPHOS 51  BILITOT 0.8  PROT 6.1*  ALBUMIN 3.4*   No results for input(s): LIPASE, AMYLASE in the last 168 hours. No results for input(s): AMMONIA in the last 168 hours. Coagulation Profile: Recent Labs  Lab 07/31/20 0433  INR 1.0   Cardiac Enzymes: No results for input(s): CKTOTAL, CKMB, CKMBINDEX, TROPONINI in the last 168 hours. BNP (last 3 results) No results for input(s): PROBNP in the last 8760 hours. HbA1C: No results for input(s): HGBA1C in the last 72 hours. CBG: No results for input(s): GLUCAP in the last 168 hours. Lipid Profile: No results for input(s): CHOL, HDL, LDLCALC, TRIG, CHOLHDL, LDLDIRECT in the last 72 hours. Thyroid Function Tests: No results for input(s): TSH, T4TOTAL, FREET4, T3FREE, THYROIDAB in the last 72 hours. Anemia Panel: No results for input(s): VITAMINB12, FOLATE, FERRITIN, TIBC, IRON, RETICCTPCT in the last 72 hours. Sepsis Labs: No results for input(s): PROCALCITON, LATICACIDVEN in the last 168 hours.  Recent Results (from the past 240 hour(s))  Resp Panel by RT-PCR (Flu A&B, Covid) Nasopharyngeal Swab     Status: None   Collection Time: 07/30/20  7:00 PM   Specimen: Nasopharyngeal Swab; Nasopharyngeal(NP) swabs in vial transport medium  Result Value Ref Range Status   SARS Coronavirus 2 by RT PCR NEGATIVE NEGATIVE Final    Comment: (NOTE) SARS-CoV-2 target nucleic acids are NOT DETECTED.  The SARS-CoV-2 RNA is generally detectable in upper respiratory specimens during the acute phase  of infection. The lowest concentration of SARS-CoV-2 viral copies this assay can detect is 138 copies/mL. A negative result does not preclude SARS-Cov-2 infection and should not be used as the sole basis for treatment or other patient management decisions. A negative result may occur with  improper specimen collection/handling, submission of specimen other than nasopharyngeal swab, presence of viral mutation(s) within the areas targeted by this assay, and inadequate number of viral copies(<138 copies/mL). A negative result must be combined with clinical observations, patient history, and epidemiological information. The expected result is Negative.  Fact Sheet for Patients:  EntrepreneurPulse.com.au  Fact Sheet for Healthcare Providers:  IncredibleEmployment.be  This test is no t yet approved or cleared by the [place] FDA and  has been authorized for detection and/or diagnosis of SARS-CoV-2 by FDA under an Emergency Use Authorization (EUA). This EUA will remain  in effect (meaning this test can be used) for the duration of the COVID-19 declaration under Section 564(b)(1) of the Act, 21 U.S.C.section 360bbb-3(b)(1),  unless the authorization is terminated  or revoked sooner.       Influenza A by PCR NEGATIVE NEGATIVE Final   Influenza B by PCR NEGATIVE NEGATIVE Final    Comment: (NOTE) The Xpert Xpress SARS-CoV-2/FLU/RSV plus assay is intended as an aid in the diagnosis of influenza from Nasopharyngeal swab specimens and should not be used as a sole basis for treatment. Nasal washings and aspirates are unacceptable for Xpert Xpress SARS-CoV-2/FLU/RSV testing.  Fact Sheet for Patients: EntrepreneurPulse.com.au  Fact Sheet for Healthcare Providers: IncredibleEmployment.be  This test is not yet approved or cleared by the [place] FDA and has been authorized for detection and/or diagnosis of  SARS-CoV-2 by FDA under an Emergency Use Authorization (EUA). This EUA will remain in effect (meaning this test can be used) for the duration of the COVID-19 declaration under Section 564(b)(1) of the Act, 21 U.S.C. section 360bbb-3(b)(1), unless the authorization is terminated or revoked.  Performed at [facility], [place] [address] [place], [place] [address]          Radiology Studies: DG Pelvis 1-2 Views  Result Date: 07/30/2020 CLINICAL DATA:  Status post fall. EXAM: PELVIS - 1-2 VIEW COMPARISON:  None. FINDINGS: Ill-defined fracture deformities are seen involving the superior and inferior pubic rami on the left. These are of indeterminate age. There is no evidence of dislocation. No pelvic bone lesions are seen. IMPRESSION: 1. Fractures of the left superior and inferior pubic rami of indeterminate age. CT correlation is recommended. Electronically Signed   By: Virgina Norfolk M.D.   On: 07/30/2020 16:55   CT PELVIS WO CONTRAST  Addendum Date: 07/31/2020   ADDENDUM REPORT: 07/31/2020 23:25 ADDENDUM: Results were discussed with Wyn Quaker at 6:51 p.m. [place] on July 30, 2020. Electronically Signed   By: Virgina Norfolk M.D.   On: 07/31/2020 23:25   Result Date: 07/31/2020 CLINICAL DATA:  Status post fall. EXAM: CT PELVIS WITHOUT CONTRAST TECHNIQUE: Multidetector CT imaging of the pelvis was performed following the standard protocol without intravenous contrast. COMPARISON:  None. FINDINGS: Urinary Tract:  No abnormality visualized. Bowel:  Unremarkable visualized pelvic bowel loops. Vascular/Lymphatic: There is mild calcification of the abdominal aorta and bilateral common iliac arteries, without evidence of aneurysmal dilatation. No pathologically enlarged lymph nodes. Reproductive:  No mass or other significant abnormality Other:  None. Musculoskeletal: Acute fracture deformities are seen involving the posteromedial aspect of the left superior pubic ramus and left  inferior pubic ramus. A mild amount of adjacent pelvic inflammatory fat stranding is seen. This is separated from the urinary bladder by a thin fat plane. IMPRESSION: Acute fractures of the left superior and left inferior pubic rami with small adjacent pelvic hematomas. Aortic Atherosclerosis (ICD10-I70.0). Electronically Signed: By: Virgina Norfolk M.D. On: 07/30/2020 18:53   DG FEMUR MIN 2 VIEWS LEFT  Result Date: 07/30/2020 CLINICAL DATA:  Status post fall. EXAM: LEFT FEMUR 2 VIEWS COMPARISON:  None. FINDINGS: Acute fracture deformities are seen involving the left superior and left inferior pubic rami. There is no evidence of dislocation. Soft tissues are unremarkable. IMPRESSION: Acute fracture of the left superior and left inferior pubic rami. Electronically Signed   By: Virgina Norfolk M.D.   On: 07/30/2020 16:56        Scheduled Meds: . cholecalciferol  4,000 Units Oral Daily  . citalopram  20 mg Oral Daily  . lisinopril  10 mg Oral Daily  . raloxifene  60 mg Oral Daily  . rosuvastatin  40 mg Oral  QHS  . senna-docusate  2 tablet Oral QHS  . vitamin B-12  1,000 mcg Oral Daily   Continuous Infusions:   LOS: 2 days   Time spent= 35 mins    Greco Gastelum Arsenio Loader, MD [facility]  If 7PM-7AM, please contact night-coverage  08/01/2020, 11:44 AM

## 2020-08-01 NOTE — Progress Notes (Signed)
Inpatient Rehabilitation-Admissions Coordinator   Met with pt bedside for inpatient rehab assessment. Discussed CIR program details, expectations, and anticipated LOS. Feel pt is an appropriate candidate. Pt is considering this as an option as her plan prior to this meeting was to go stay with her sister for support at DC. We discussed that I don't have any beds for her today so I can follow up tomorrow to see what her preference is (CIR vs home). Pt would like to discuss her options with her sister this evening and will have a decision for me tomorrow.   Will continue to follow.   Raechel Ache, OTR/L  Rehab Admissions Coordinator  [phone] 08/01/2020 11:12 AM

## 2020-08-01 NOTE — TOC Initial Note (Addendum)
Transition of Care [facility]) - Initial/Assessment Note    Patient Details  Name: [name] [name] MRN: [id number] Date of Birth: 1950/10/22  Transition of [facility] CM/SW Contact:    Curlene Labrum, RN Phone Number: 08/01/2020, 11:54 AM  Clinical Narrative:                 Case management spoke with the patient at the bedside with the sister, [name], available on the phone regarding transitions of care to home.  The patient and sister declined SNf placement and the sister states that she will be available 24 hours for assistance to the patient.  The patient will be discharged to her sister's home located at [address] [place], [place] [address] - one level home with no steps.  The patient was offered choice regarding home health agency and they did not have a preference for the agency.  I called [facility] and spoke with Tommi Rumps, [facility] and they accepted him for home health services.  I called [employer] [facility] and ordered a wheelchair, rolling walker and 3:1 to be delivered to the sister's home above.  The patient will be discharged home tomorrow via [facility].  08/01/2020 - Patient was offered possible CIR admission in an earlier note.  I will continue to follow that patient for home discharge versus CIr admission.  CIR does not have a bed today.  Expected Discharge Plan: [place] Barriers to Discharge: Continued Medical Work up   Patient Goals and CMS Choice Patient states their goals for this hospitalization and ongoing recovery are:: Patient plans on discharging tomorrow with home health to her sister's home in [place], [place]. CMS Medicare.gov Compare Post Acute Care list provided to:: Patient Choice offered to / list presented to : Patient  Expected Discharge Plan and Services Expected Discharge Plan: [place]   Discharge Planning Services: CM Consult Post Acute Care Choice: Durable Medical Equipment, Home Health Living arrangements for the past  2 months: Single Family Home                 DME Arranged: 3-N-1, Walker rolling, Wheelchair manual DME Agency: [employer] (Patient requests dme to be shipped to sister's address) Date DME Agency Contacted: 08/01/20 Time DME Agency Contacted: 0867 Representative spoke with at DME Agency: Sue Lush, [place] with [employer] [facility] Arranged: PT, OT, Nurse's Aide [place] Agency: [place] Date [place]: 08/01/20 Time [place]: 6195 Representative spoke with at [place]: Tommi Rumps, [place] with [place]  Prior Living Arrangements/Services Living arrangements for the past 2 months: Single Family Home Lives with:: Self   Do you feel safe going back to the place where you live?: Yes      Need for Family Participation in Patient Care: Yes (Comment) Care giver support system in place?: Yes (comment) Current home services: DME Criminal Activity/Legal Involvement Pertinent to Current Situation/Hospitalization: No - Comment as needed  Activities of Daily Living Home Assistive Devices/Equipment: Grab bars in shower ADL Screening (condition at time of admission) Patient's cognitive ability adequate to safely complete daily activities?: Yes Is the patient deaf or have difficulty hearing?: No Does the patient have difficulty seeing, even when wearing glasses/contacts?: No Does the patient have difficulty concentrating, remembering, or making decisions?: No Patient able to express need for assistance with ADLs?: Yes Does the patient have difficulty dressing or bathing?: No Independently performs ADLs?: Yes (appropriate for developmental age) Does the patient have difficulty walking or climbing stairs?: No Weakness  of Legs: None Weakness of Arms/Hands: None  Permission Sought/Granted Permission sought to share information with : Case Manager Permission granted to share information with : Yes, Verbal Permission Granted     Permission granted to share info w AGENCY: home health agency,  [employer] for dme  Permission granted to share info w Relationship: sister - Basilio [place] - [phone]     Emotional Assessment Appearance:: Well-Groomed Attitude/Demeanor/Rapport: Gracious Affect (typically observed): Accepting Orientation: : Oriented to Self, Oriented to Place, Oriented to  Time, Oriented to Situation Alcohol / Substance Use: Not Applicable Psych Involvement: No (comment)  Admission diagnosis:  Fall [W19.XXXA] Fall, initial encounter [id number]] Closed fracture of left inferior pubic ramus (HCC) [S32.592A] Closed fracture of multiple pubic rami, left, initial encounter [facility]) [S32.592A] Patient Active Problem List   Diagnosis Date Noted  . Closed fracture of left inferior pubic ramus ([place]) 07/30/2020  . Closed fracture of left superior pubic ramus ([place]) 07/30/2020  . Left hip pain 07/30/2020  . GAD (generalized anxiety disorder) 07/30/2020  . DJD (degenerative joint disease) 09/20/2019  . Familial hyperlipidemia 07/01/2018  . Right shoulder pain 07/14/2017  . Malignant melanoma of skin of [place]hus of right eye ([place]) 03/16/2017  . PCP NOTES >>>>>>>>>>>>>>> 05/22/2015  . High coronary artery calcium score 02/14/2015  . Pain of lower leg 02/15/2014  . Hernia, abdominal 04/01/2013  . Depression with anxiety 04/01/2012  . Malignant neoplasm of upper lobe, bronchus or lung 09/30/2011  . H/O: lung cancer 09/25/2011  . Annual physical exam 09/05/2011  . IBS -diarrhea predominant 09/05/2011  . COPD (chronic obstructive pulmonary disease) ([place]) 09/05/2011  . Osteoporosis 03/01/2010  . TOBACCO ABUSE 11/14/2008  . Dyslipidemia 05/18/2007  . Essential hypertension 05/18/2007   PCP:  Colon Branch, MD Pharmacy:   [facility] [phone] [address], [address] AT [facility] [address] [place] [address] Phone: [phone] Fax: [phone]     Social Determinants of Health ([place]) Interventions    Readmission Risk  Interventions Readmission Risk Prevention Plan 08/01/2020  Post Dischage Appt Complete  Medication Screening Complete  Transportation Screening Complete  Some recent data might be hidden

## 2020-08-01 NOTE — PMR Pre-admission (Signed)
PMR Admission Coordinator Pre-Admission Assessment  Patient: [name] [name] is an 69 y.o., female MRN: [id number] DOB: 05/31/1951 Height: 5' 3" (160 cm) Weight: 73.3 kg  Insurance Information HMO:     PPO:      PCP:      IPA:      80/20:      OTHER:  PRIMARY: Medicare A and B      Policy#: [id number]      Subscriber: patient CM Name:        Phone#:       Fax#:   Pre-Cert#:        Employer: Retired Benefits:  Phone #:       Name:  Eff. Date: 01/23/18     Deduct: $1484      Out of Pocket Max: none      Life Max: N/A CIR: 100%      SNF: 100 days  Secondary:  [employer] choice care  Policy # [id number]     Phone#: [phone]  Financial Counselor:        Phone#:    The "Data Collection Information Summary" for patients in Inpatient Rehabilitation Facilities with attached "Privacy Act Statement-Health Care Records" was provided and verbally reviewed with: Patient  Emergency Contact Information Contact Information    Name Relation Home Work Mobile   Robbins,[name] Sister [phone]  [phone]      Current Medical History  Patient Admitting Diagnosis: Acute left superior and inferior pubic rami fracture with severe pain and ambulatory dysfunction after sustaining a fall.   History of Present Illness: Pt is a 69 yo female with history of sage III acute lung cancer s/p chemo in 2013 currently in remission, HTN, hyperlipidemia, and generalized anxiety disorder who was admitted to [facility] on 07/30/20 after a fall sustaining acute fractures of the left superior and left inferior pubic rami without dislocation. Dr. Jeffery with orthopedics was consulted and feels the fractures should heal well without operative intervention. Pt having significant amount of pain during acute stay, with pain medications titrated.The patient was assessed by PT/OT with recommendations for CIR. Pt is to admit to CIR on 08/03/20.   Patient's medical record from [facility] has been reviewed by  the rehabilitation admission coordinator and physician.  Past Medical History  Past Medical History:  Diagnosis Date  . Anemia    duering cancer treatment   . Anxiety   . Arthritis    OA hands   . Bilateral lower extremity pain   . Blood transfusion without reported diagnosis    during chemo for lung cancer  . Cataract    forming  but very small   . Cholelithiasis   . Colon polyps    adenomatous  . High cholesterol   . History of chemotherapy    for lung cancer- completed 2013  . Hx of radiation therapy 10/07/11 to 11/20/11   right lung  . Hx of radiation therapy 01/07/2012- 01/21/12   cranial irradiation  . Hypertension   . Lung cancer (HCC) 09/19/11   Stage III currently in remission  . Malignant melanoma of skin of canthus of right eye (HCC) 02/2017  . Osteoporosis 04/2014   Dexa scan by Dr. Lowe, T-score -2.7, next in 2 years, with at least 13 % major fracture in 10 years  . Pitting edema    Bilateral lower extremities, duplex ultrasound 04/03/2015 normal, no DVT    Family History   family history includes Atrial fibrillation in her mother; Bladder   Cancer in her father; Clotting disorder in her mother; Colon cancer (age of onset: 61) in her maternal aunt; Colon polyps in her father and mother; Drug abuse in her son; Heart disease in her mother; Stroke in her father.  Prior Rehab/Hospitalizations Has the patient had prior rehab or hospitalizations prior to admission? No  Has the patient had major surgery during 100 days prior to admission? No   Current Medications  Current Facility-Administered Medications:  .  acetaminophen (TYLENOL) tablet 650 mg, 650 mg, Oral, Q6H PRN, 650 mg at 08/03/20 1209 **OR** acetaminophen (TYLENOL) suppository 650 mg, 650 mg, Rectal, Q6H PRN, Howerter, Justin B, DO .  ALPRAZolam Duanne Moron) tablet 0.5 mg, 0.5 mg, Oral, Daily PRN, Howerter, Justin B, DO .  cholecalciferol (VITAMIN D3) tablet 4,000 Units, 4,000 Units, Oral, Daily, Howerter, Justin B,  DO, 4,000 Units at 08/03/20 0908 .  citalopram (CELEXA) tablet 20 mg, 20 mg, Oral, Daily, Arrien, Jimmy Picket, MD, 20 mg at 08/03/20 0908 .  dextromethorphan-guaiFENesin (MUCINEX DM) 30-600 MG per 12 hr tablet 1 tablet, 1 tablet, Oral, BID PRN, Amin, Ankit Chirag, MD .  lisinopril (ZESTRIL) tablet 10 mg, 10 mg, Oral, Daily, Arrien, Jimmy Picket, MD, 10 mg at 08/03/20 0908 .  morphine 2 MG/ML injection 2 mg, 2 mg, Intravenous, Q4H PRN, Lisette Abu, PA-C, 2 mg at 07/31/20 1725 .  naloxone [facility]) injection 0.4 mg, 0.4 mg, Intravenous, PRN, Howerter, Justin B, DO .  ondansetron (ZOFRAN) injection 4 mg, 4 mg, Intravenous, Q6H PRN, Howerter, Justin B, DO .  oxyCODONE (Oxy IR/ROXICODONE) immediate release tablet 5-15 mg, 5-15 mg, Oral, Q4H PRN, Lisette Abu, PA-C, 5 mg at 08/03/20 0908 .  raloxifene (EVISTA) tablet 60 mg, 60 mg, Oral, Daily, Howerter, Justin B, DO, 60 mg at 08/03/20 0908 .  rosuvastatin (CRESTOR) tablet 40 mg, 40 mg, Oral, QHS, Howerter, Justin B, DO, 40 mg at 08/03/20 0910 .  senna-docusate (Senokot-S) tablet 2 tablet, 2 tablet, Oral, BID PRN, Damita Lack, MD, 2 tablet at 08/01/20 1235 .  vitamin B-12 (CYANOCOBALAMIN) tablet 1,000 mcg, 1,000 mcg, Oral, Daily, Howerter, Justin B, DO, 1,000 mcg at 08/03/20 0908  Patients Current Diet:  Diet Order            Diet regular Room service appropriate? Yes; Fluid consistency: Thin  Diet effective now                 Precautions / Restrictions Precautions Precautions: Fall,Other (comment) Precaution Comments: urinary urgency; very fearful of pain in LLE Restrictions Weight Bearing Restrictions: Yes RLE Weight Bearing: Weight bearing as tolerated LLE Weight Bearing: Weight bearing as tolerated   Has the patient had 2 or more falls or a fall with injury in the past year? Yes  Prior Activity Level Community (5-7x/wk): retired from running a boutique; drives PTA, Independent PTA  Prior Functional  Level Self Care: Did the patient need help bathing, dressing, using the toilet or eating? Independent  Indoor Mobility: Did the patient need assistance with walking from room to room (with or without device)? Independent  Stairs: Did the patient need assistance with internal or external stairs (with or without device)? Independent  Functional Cognition: Did the patient need help planning regular tasks such as shopping or remembering to take medications? Independent  Home Assistive Devices / Equipment Home Assistive Devices/Equipment: Grab bars in shower Home Equipment: Shower seat,Grab bars - tub/shower  Prior Device Use: Indicate devices/aids used by the patient prior to current illness, exacerbation or injury?  None of the above  Current Functional Level Cognition  Overall Cognitive Status: No family/caregiver present to determine baseline cognitive functioning Current Attention Level: Selective Orientation Level: Oriented X4 Following Commands: Follows multi-step commands inconsistently General Comments: Suspect apparent cognitive impairment related to pt very distracted by pain requiring frequent cues for task and redirection, especially when attempting to take steps. Self-limiting by pain    Extremity Assessment (includes Sensation/Coordination)  Upper Extremity Assessment: Overall WFL for tasks assessed  Lower Extremity Assessment: Defer to PT evaluation RLE Deficits / Details: Functionally at least 3/5 throughout, limited by pelvic/hip pain RLE: Unable to fully assess due to pain LLE Deficits / Details: Hip strength functionally <3/5 throughout limited by pain; knee flex/ext at least 3/5 LLE: Unable to fully assess due to pain LLE Coordination: decreased gross motor    ADLs  Overall ADL's : Needs assistance/impaired Eating/Feeding: Independent,Sitting Grooming: Set up,Sitting Grooming Details (indicate cue type and reason): Setup to brush teeth seated in recliner Upper  Body Bathing: Set up,Sitting Lower Body Bathing: Moderate assistance,Sitting/lateral leans,Sit to/from stand Upper Body Dressing : Set up,Sitting Lower Body Dressing: Maximal assistance,Sitting/lateral leans,Sit to/from stand Lower Body Dressing Details (indicate cue type and reason): Max A for socks, unable to bring feet to self or bend to feet Toilet Transfer Details (indicate cue type and reason): Simulated to recliner. Unable to pivot with walker. Min A for squat/scoot transfer to drop arm recliner. will need drop arm BSC Toileting- Clothing Manipulation and Hygiene: Sitting/lateral lean,Sit to/from stand,Maximal assistance General ADL Comments: Pt limited by pain but able to progress further with premedication. Began instruction in compensatory strategies for ADLs/transfers to maximize independence     Mobility  Overal bed mobility: Needs Assistance Bed Mobility: Rolling,Sidelying to Sit Rolling: Supervision Sidelying to sit: Min assist Supine to sit: Mod assist General bed mobility comments: much improved BLE movement in bed, pt with heavy use of bed rail and minA for hip translation to EOB and trunk assist    Transfers  Overall transfer level: Needs assistance Equipment used: Rolling walker (2 wheeled) Transfers: Sit to/from Stand Sit to Stand: Mod assist,+2 safety/equipment Stand pivot transfers: Mod assist,+2 safety/equipment,+2 physical assistance  Lateral/Scoot Transfers: Min assist General transfer comment: x2 attempts to stand with pt returning to sit due to pain, able to stand on 2nd trial with RW and modA for trunk elevation; increased time and effort due to pain, cues for hand placement. After working on taking steps, pt unable to turn completely towards chair, requiring +2modA to pivot to recliner to complete safe transfer; RN/pt notified they may need to try lateral scooting for return transfer (pt in drop arm chair) vs Stedy    Ambulation / Gait / Stairs / Wheelchair  Mobility  Ambulation/Gait Gait Distance (Feet): 1 Feet Assistive device: Rolling walker (2 wheeled) General Gait Details: pt unable to accept weight on LLE to take step with RLE, pt able to pivot only on RLE Gait velocity: Decreased    Posture / Balance Balance Overall balance assessment: Needs assistance Sitting-balance support: No upper extremity supported,Feet supported Sitting balance-Leahy Scale: Fair Standing balance support: Bilateral upper extremity supported,During functional activity Standing balance-Leahy Scale: Poor Standing balance comment: Reliant on UE support and external assist    Special needs/care consideration Skin : ecchymosis to right arm.    Previous Home Environment (from acute therapy documentation) Living Arrangements: Alone Available Help at Discharge: Family,Available PRN/intermittently Type of Home: House (townhouse) Home Layout: One level Home Access: Level entry Bathroom Shower/Tub: Walk-in   shower Bathroom Toilet: Handicapped height Bathroom Accessibility: Yes Home Care Services: No Additional Comments: Lives in accessible townhome with 1 dog. Sister lives 20-min away and can assist, but also has 5 dogs  Discharge Living Setting Plans for Discharge Living Setting: House,Other (Comment) (will go live with sister at [place] for time period) Type of Home at Discharge: House Discharge Home Layout: One level Discharge Home Access: Level entry Discharge Bathroom Shower/Tub: Walk-in shower Discharge Bathroom Toilet: Handicapped height Discharge Bathroom Accessibility: Yes How Accessible: Accessible via walker Does the patient have any problems obtaining your medications?: No  Social/Family/Support Systems Patient Roles: Other (Comment) (close to sister) Contact Information: sister: [name] [phone] Anticipated Caregiver: sister and brother in law Anticipated Caregiver's Contact Information: see above Ability/Limitations of Caregiver: Min A Caregiver  Availability: 24/7 Discharge Plan Discussed with Primary Caregiver: Yes (pt and sister) Is Caregiver In Agreement with Plan?: Yes Does Caregiver/Family have Issues with Lodging/Transportation while Pt is in Rehab?: No  Goals Patient/Family Goal for Rehab: PT/OT: Mod I; SLP: NA Expected length of stay: 7-10 days Pt/Family Agrees to Admission and willing to participate: Yes Program Orientation Provided & Reviewed with Pt/Caregiver Including Roles  & Responsibilities: Yes (pt and sister)  Barriers to Discharge:  (none)  Decrease burden of Care through IP rehab admission: Other NA  Possible need for SNF placement upon discharge: Not anticipated; pt has good family support at DC and give the patient's PLOF, age, and WB status, anticipate she can progress to a Mod I level at DC from CIR.   Patient Condition: I have reviewed medical records from [facility], spoken with CM, and patient and family member. I met with patient at the bedside for inpatient rehabilitation assessment.  Patient will benefit from ongoing PT and OT, can actively participate in 3 hours of therapy a day 5 days of the week, and can make measurable gains during the admission.  Patient will also benefit from the coordinated team approach during an Inpatient Acute Rehabilitation admission.  The patient will receive intensive therapy as well as Rehabilitation physician, nursing, social worker, and care management interventions.  Due to safety, disease management, medication administration, pain management and patient education the patient requires 24 hour a day rehabilitation nursing.  The patient is currently mod to max Assist with mobility and basic ADLs.  Discharge setting and therapy post discharge at home with home health is anticipated.  Patient has agreed to participate in the Acute Inpatient Rehabilitation Program and will admit today.  Preadmission Screen Completed By:  Eugenia M Logue, 08/03/2020 12:55  PM ______________________________________________________________________   Discussed status with Dr.  and Dr. Swartz on 08/03/20 at 12/52 and received approval for admission today.  Admission Coordinator:  Eugenia M Logue, RN, time 12:52 /Date 08/03/20   Assessment/Plan: Diagnosis: Closed fracture of let inferior pubic ramus 1. Does the need for close, 24 hr/day Medical supervision in concert with the patient's rehab needs make it unreasonable for this patient to be served in a less intensive setting? Yes 2. Co-Morbidities requiring supervision/potential complications: dyslipidemia, essential HTN, left hip pain, generalized anxiety, overweight (BMI 28.63). 3. Due to bladder management, bowel management, safety, skin/wound care, disease management, medication administration, pain management and patient education, does the patient require 24 hr/day rehab nursing? Yes 4. Does the patient require coordinated care of a physician, rehab nurse, PT, OT, and SLP to address physical and functional deficits in the context of the above medical diagnosis(es)? Yes Addressing deficits in the following areas: balance,   endurance, locomotion, strength, transferring, bowel/bladder control, bathing, dressing, feeding, grooming, toileting and psychosocial support 5. Can the patient actively participate in an intensive therapy program of at least 3 hrs of therapy 5 days a week? Yes 6. The potential for patient to make measurable gains while on inpatient rehab is excellent 7. Anticipated functional outcomes upon discharge from inpatient rehab: modified independent PT, modified independent OT, independent SLP 8. Estimated rehab length of stay to reach the above functional goals is: 12-16 days 9. Anticipated discharge destination: Home 10. Overall Rehab/Functional Prognosis: excellent   MD Signature: Leeroy Cha, MD

## 2020-08-01 NOTE — Evaluation (Signed)
Occupational Therapy Evaluation Patient Details Name: [name] [name] MRN: [id number] DOB: June 02, 1951 Today's Date: 08/01/2020    History of Present Illness Pt is a 69 y.o. female admitted 07/30/20 after fall onto L hip sustaining L superior and L inferior pubic rami fxs without evidence of dislocation. Per ortho consult, no need for surgical intervention, to be WBAT. PMH includes stage 3 lung CA s/p chemoradiation (2013), HTN, anxiety disorder.   Clinical Impression   PTA, pt lives alone and reports Independence in all ADLs, IADLs and mobility without use of AD. Pt presents now with deficits in pain, activity tolerance and standing balance. Pt able to tolerate more activities with provided pain medication this AM. Pt able to demo bed mobility with Mod A using log rolling technique and pillow between LEs for comfort. Pt overall Max A for sit to stand transfer with RW but unable to progress to taking steps to chair yet. Instructed in scoot/squat transfer with pt able to demo at [place]. Pt requires Setup for UB ADLs and up to Max A for LB ADLs. Educated on compensatory strategies for ADLs and plans to assess transfer to drop arm BSC. Based on high PLOF, recommend CIR for intensive therapies before returning home.     Follow Up Recommendations  CIR;Supervision/Assistance - 24 hour    Equipment Recommendations  3 in 1 bedside commode;Wheelchair (measurements OT);Wheelchair cushion (measurements OT);Other (comment) (RW)    Recommendations for Other Services Rehab consult     Precautions / Restrictions Precautions Precautions: Fall;Other (comment) Precaution Comments: Urinary urgency/frequency Restrictions Weight Bearing Restrictions: Yes RLE Weight Bearing: Weight bearing as tolerated LLE Weight Bearing: Weight bearing as tolerated      Mobility Bed Mobility Overal bed mobility: Needs Assistance Bed Mobility: Rolling;Sidelying to Sit Rolling: Min assist Sidelying to sit: Mod  assist       General bed mobility comments: guided pt in log roll and sidelying to sit to minimize pain. With placement of pillow between legs, pt able to roll with decreased pain. Mod A for advancement of trunk to sitting position    Transfers Overall transfer level: Needs assistance Equipment used: Rolling walker (2 wheeled);None Transfers: Sit to/from Stand;Lateral/Scoot Transfers Sit to Stand: Max assist        Lateral/Scoot Transfers: Min assist General transfer comment: Max A for sit to stand with RW. Pt able to take step with L foot but unable with R foot due to pain. Opted for trial of squat/scoot transfer to drop arm recliner with MIn A and cues for technique. Pt placing most weight through R LE and reports minimal pain     Balance Overall balance assessment: Needs assistance Sitting-balance support: No upper extremity supported;Feet supported Sitting balance-Leahy Scale: Fair     Standing balance support: Bilateral upper extremity supported;During functional activity Standing balance-Leahy Scale: Poor Standing balance comment: Reliant on UE support and external assist                           ADL either performed or assessed with clinical judgement   ADL Overall ADL's : Needs assistance/impaired Eating/Feeding: Independent;Sitting   Grooming: Set up;Sitting Grooming Details (indicate cue type and reason): Setup to brush teeth seated in recliner Upper Body Bathing: Set up;Sitting   Lower Body Bathing: Moderate assistance;Sitting/lateral leans;Sit to/from stand   Upper Body Dressing : Set up;Sitting   Lower Body Dressing: Maximal assistance;Sitting/lateral leans;Sit to/from stand Lower Body Dressing Details (indicate cue type and reason):  Max A for socks, unable to bring feet to self or bend to feet   Toilet Transfer Details (indicate cue type and reason): Simulated to recliner. Unable to pivot with walker. Min A for squat/scoot transfer to drop arm  recliner. will need drop arm BSC Toileting- Clothing Manipulation and Hygiene: Sitting/lateral lean;Sit to/from stand;Maximal assistance         General ADL Comments: Pt limited by pain but able to progress further with premedication. Began instruction in compensatory strategies for ADLs/transfers to maximize independence      Vision Baseline Vision/History: Wears glasses Wears Glasses: At all times Patient Visual Report: No change from baseline Vision Assessment?: No apparent visual deficits     Perception     Praxis      Pertinent Vitals/Pain Pain Assessment: 0-10 Pain Score: 6  Pain Location: L hip/pelvic area > RLE Pain Descriptors / Indicators: Discomfort;Grimacing;Guarding;Moaning Pain Intervention(s): Limited activity within patient's tolerance;Monitored during session;Premedicated before session;Repositioned     Hand Dominance Right   Extremity/Trunk Assessment Upper Extremity Assessment Upper Extremity Assessment: Overall WFL for tasks assessed   Lower Extremity Assessment Lower Extremity Assessment: Defer to PT evaluation   Cervical / Trunk Assessment Cervical / Trunk Assessment: Normal   Communication Communication Communication: No difficulties   Cognition Arousal/Alertness: Awake/alert Behavior During Therapy: WFL for tasks assessed/performed Overall Cognitive Status: Within Functional Limits for tasks assessed                                     General Comments       Exercises     Shoulder Instructions      Home Living Family/patient expects to be discharged to:: Private residence Living Arrangements: Alone Available Help at Discharge: Family;Available PRN/intermittently Type of Home: House (townhouse) Home Access: Level entry     Home Layout: One level     Bathroom Shower/Tub: Occupational psychologist: Handicapped height Bathroom Accessibility: Yes   Home Equipment: Shower seat;Grab bars - tub/shower    Additional Comments: Lives in accessible townhome with 1 dog. Sister lives 20-min away and can assist, but also has 5 dogs      Prior Functioning/Environment Level of Independence: Independent        Comments: Independent, active, does not work, enjoys time with dog        OT Problem List: Decreased strength;Decreased activity tolerance;Impaired balance (sitting and/or standing);Decreased knowledge of use of DME or AE;Decreased knowledge of precautions;Pain      OT Treatment/Interventions: Self-care/ADL training;DME and/or AE instruction;Therapeutic exercise;Therapeutic activities;Patient/family education;Balance training    OT Goals(Current goals can be found in the care plan section) Acute Rehab OT Goals Patient Stated Goal: be able to walk again, get home to dog OT Goal Formulation: With patient Time For Goal Achievement: 08/15/20 Potential to Achieve Goals: Good ADL Goals Pt Will Perform Grooming: standing;with set-up Pt Will Perform Lower Body Bathing: with set-up;sitting/lateral leans;sit to/from stand Pt Will Perform Lower Body Dressing: with set-up;sit to/from stand;sitting/lateral leans Pt Will Transfer to Toilet: with set-up;stand pivot transfer;bedside commode Pt Will Perform Toileting - Clothing Manipulation and hygiene: with set-up;sit to/from stand;sitting/lateral leans  OT Frequency: Min 2X/week   Barriers to D/C:            Co-evaluation              AM-PAC OT "6 Clicks" Daily Activity     Outcome Measure Help from another  person eating meals?: None Help from another person taking care of personal grooming?: A Little Help from another person toileting, which includes using toliet, bedpan, or urinal?: A Lot Help from another person bathing (including washing, rinsing, drying)?: A Lot Help from another person to put on and taking off regular upper body clothing?: A Little Help from another person to put on and taking off regular lower body clothing?:  A Lot 6 Click Score: 16   End of Session Equipment Utilized During Treatment: Gait belt;Rolling walker Nurse Communication: Mobility status;Patient requests pain meds  Activity Tolerance: Patient tolerated treatment well Patient left: in chair;with call bell/phone within reach;with chair alarm set;Other (comment) (alarm cord not in room, but chair pad attached and on)  OT Visit Diagnosis: Unsteadiness on feet (R26.81);Other abnormalities of gait and mobility (R26.89);Muscle weakness (generalized) (M62.81);Pain Pain - Right/Left: Left Pain - part of body: Hip;Leg                Time: 5498-2641 OT Time Calculation (min): 43 min Charges:  OT General Charges $OT Visit: 1 Visit OT Evaluation $OT Eval Moderate Complexity: 1 Mod OT Treatments $Self Care/Home Management : 8-22 mins $Therapeutic Activity: 8-22 mins  Layla Maw, OTR/L Layla Maw 08/01/2020, 12:15 PM

## 2020-08-02 LAB — BASIC METABOLIC PANEL
Anion gap: 10 (ref 5–15)
BUN: 11 mg/dL (ref 8–23)
CO2: 26 mmol/L (ref 22–32)
Calcium: 9 mg/dL (ref 8.9–10.3)
Chloride: 100 mmol/L (ref 98–111)
Creatinine, Ser: 0.97 mg/dL (ref 0.44–1.00)
GFR, Estimated: >60 mL/min (ref >60)
Glucose, Bld: 115 mg/dL — ABNORMAL HIGH (ref 70–99)
Potassium: 3.7 mmol/L (ref 3.5–5.1)
Sodium: 136 mmol/L (ref 135–145)

## 2020-08-02 LAB — MAGNESIUM: Magnesium: 1.7 mg/dL (ref 1.7–2.4)

## 2020-08-02 MED ORDER — LISINOPRIL 10 MG PO TABS
10.0000 mg | ORAL_TABLET | Freq: Every day | ORAL | Status: DC
Start: 2020-08-02 — End: 2020-08-14

## 2020-08-02 MED ORDER — ALPRAZOLAM 0.5 MG PO TABS: 0.5000 mg | ORAL_TABLET | Freq: Every day | ORAL | 0 refills | Status: DC | PRN

## 2020-08-02 MED ORDER — OXYCODONE HCL 5 MG PO TABS
5.0000 mg | ORAL_TABLET | ORAL | 0 refills | Status: DC | PRN
Start: 2020-08-02 — End: 2020-08-14

## 2020-08-02 MED ORDER — SENNOSIDES-DOCUSATE SODIUM 8.6-50 MG PO TABS: 2.0000 | ORAL_TABLET | Freq: Two times a day (BID) | ORAL | Status: DC | PRN

## 2020-08-02 MED ORDER — CITALOPRAM HYDROBROMIDE 20 MG PO TABS
20.0000 mg | ORAL_TABLET | Freq: Every day | ORAL | Status: DC
Start: 2020-08-02 — End: 2021-08-07

## 2020-08-02 NOTE — Progress Notes (Signed)
Inpatient Rehab Admissions Coordinator:   I do not have a CIR bed to offer this patient today. Pt. Remains interested in CIR admission. Will follow for potential admission, pending bed availability.   Clemens Catholic, [place], [place] Admissions Coordinator  [phone] ([place]) [phone] (office)

## 2020-08-02 NOTE — Progress Notes (Signed)
Physical Therapy Treatment Patient Details Name: [name] [name] MRN: [id number] DOB: 24-Apr-1951 Today's Date: 08/02/2020    History of Present Illness Pt is a 69 y.o. female admitted 07/30/20 after fall onto L hip sustaining L superior and L inferior pubic rami fxs without evidence of dislocation. Per ortho consult, no need for surgical intervention, to be WBAT. PMH includes stage 3 lung CA s/p chemoradiation (2013), HTN, anxiety disorder.   PT Comments    Pt progressing well with mobility, demonstrates significant improvement in BLE ROM/strength with open-chain motion. Today's session focused on transfer and gait training with RW, pt with improved ability to take step with LLE, but requiring mod-maxA to offload painful LLE in order to progress R foot. Pt appears very internally distracted by pain affecting mobility. Despite pain, pt very motivated to participate and regain PLOF. Continue to recommend intensive CIR-level therapies to maximize functional mobility and independence prior to return home.    Follow Up Recommendations  CIR;Supervision for mobility/OOB     Equipment Recommendations  Rolling walker with 5" wheels;3in1 (PT);Wheelchair (measurements PT);Wheelchair cushion (measurements PT)    Recommendations for Other Services       Precautions / Restrictions Precautions Precautions: Fall;Other (comment) Precaution Comments: Urinary urgency/frequency; very fearful of pain in LLE Restrictions Weight Bearing Restrictions: Yes RLE Weight Bearing: Weight bearing as tolerated LLE Weight Bearing: Weight bearing as tolerated    Mobility  Bed Mobility Overal bed mobility: Needs Assistance Bed Mobility: Rolling;Sidelying to Sit Rolling: Min assist Sidelying to sit: Mod assist       General bed mobility comments: Pt with much improved BLE movement in bed, minA for roll onto R-side with significant pain flexing hips/knees up, requiring modA for LE manage and trunk  elevation; increased time and effort  Transfers Overall transfer level: Needs assistance Equipment used: Rolling walker (2 wheeled) Transfers: Sit to/from Stand Sit to Stand: Mod assist Stand pivot transfers: Mod assist       General transfer comment: Multiple attempts to stand with pt returning to sit due to pain, able to stand on 3rd trial with RW and modA for trunk elevation; increased time and effort due to pain, cues for hand placement. After working on taking steps, pt unable to turn completely towards chair, requiring modA to pivot to recliner to complete safe transfer  Ambulation/Gait   Gait Distance (Feet): 1 Feet Assistive device: Rolling walker (2 wheeled)   Gait velocity: Decreased   General Gait Details: Mod-maxA to initiate steps, pt with significant difficulty accepting weight onto LLE in order to take step with R foot; improved ability to take steps with LLE, able to take 1x step with RLE before reverting to sliding it with cues; demonstrates considerable BUE and BLE strength, but suspect self-limiting by pain with attempts at [facility] through LLE; arms fatiguing due to this eventually requiring pivot to recliner   Stairs             Wheelchair Mobility    Modified Rankin (Stroke Patients Only)       Balance Overall balance assessment: Needs assistance Sitting-balance support: No upper extremity supported;Feet supported Sitting balance-Leahy Scale: Fair     Standing balance support: Bilateral upper extremity supported;During functional activity Standing balance-Leahy Scale: Poor Standing balance comment: Reliant on UE support and external assist                            Cognition Arousal/Alertness: Awake/alert Behavior During Therapy: [facility]  for tasks assessed/performed;Anxious Overall Cognitive Status: No family/caregiver present to determine baseline cognitive functioning Area of Impairment: Attention;Following commands;Problem solving                    Current Attention Level: Selective   Following Commands: Follows multi-step commands inconsistently     Problem Solving: Requires verbal cues General Comments: Suspect apparent cognitive impairment related to pt very distracted by pain requiring frequent cues for task and redirection, especially when attempting to take steps. Self-limiting by pain      Exercises General Exercises - Lower Extremity Ankle Circles/Pumps: AROM;Both;Seated Long Arc Quad: AROM;Both;Seated Heel Slides: AROM;Both;Supine    General Comments        Pertinent Vitals/Pain Pain Assessment: Faces Faces Pain Scale: Hurts whole lot Pain Location: L hip/pelvic area (especially with WB) > RLE Pain Descriptors / Indicators: Discomfort;Grimacing;Guarding;Moaning Pain Intervention(s): Limited activity within patient's tolerance;Monitored during session;Premedicated before session;Repositioned    Home Living                      Prior Function            PT Goals (current goals can now be found in the care plan section) Progress towards PT goals: Progressing toward goals    Frequency    Min 5X/week      PT Plan Current plan remains appropriate    Co-evaluation              AM-PAC PT "6 Clicks" Mobility   Outcome Measure  Help needed turning from your back to your side while in a flat bed without using bedrails?: A Little Help needed moving from lying on your back to sitting on the side of a flat bed without using bedrails?: A Lot Help needed moving to and from a bed to a chair (including a wheelchair)?: A Lot Help needed standing up from a chair using your arms (e.g., wheelchair or bedside chair)?: A Lot Help needed to walk in hospital room?: A Lot Help needed climbing 3-5 steps with a railing? : Total 6 Click Score: 12    End of Session Equipment Utilized During Treatment: Gait belt Activity Tolerance: Patient tolerated treatment well;Patient limited by  pain Patient left: in chair;with call bell/phone within reach;with chair alarm set Nurse Communication: Mobility status PT Visit Diagnosis: Other abnormalities of gait and mobility (R26.89);Muscle weakness (generalized) (M62.81)     Time: 0277-4128 PT Time Calculation (min) (ACUTE ONLY): 27 min  Charges:  $Gait Training: 8-22 mins $Therapeutic Activity: 8-22 mins                     Mabeline Caras, PT, DPT Acute Rehabilitation Services  Pager [phone] Office [place] 08/02/2020, 2:56 PM

## 2020-08-02 NOTE — Discharge Summary (Addendum)
Physician Discharge Summary  [name] [name] CZY:[id number] DOB: 18-Feb-1951 DOA: 07/30/2020  PCP: Colon Branch, MD  Admit date: 07/30/2020 Discharge date: 08/03/2020  Admitted From: Home Disposition: CIR  Recommendations for Outpatient Follow-up:  1. Follow up with PCP in 1-2 weeks 2. Please obtain BMP/CBC in one week your next doctors visit.  3. Bowel regimen as needed to have daily bowel movements    Discharge Condition: Stable CODE STATUS: Full code Diet recommendation: Heart healthy  Brief/Interim Summary: 69 year old female with history of stage III lung cancer status post chemoradiation in remission, HTN, HLD, anxiety had a mechanical fall at home suffered from acute left superior inferior pubic rami fracture.  Orthopedic recommended conservative management.  PT recommended CIR.   Assessment & Plan:   Principal Problem:   Closed fracture of left inferior pubic ramus (HCC) Active Problems:   Dyslipidemia   Essential hypertension   Closed fracture of left superior pubic ramus (HCC)   Left hip pain   GAD (generalized anxiety disorder)  1. Acute left superior and inferior pubic rami fracture with severe pain and ambulatory dysfunction.  Patient's pain is now better controlled with current regimen therefore I explained her that she will continue taking Tylenol and oxycodone for mild to moderate/severe pain.  PT recommends CIR therefore arrangements being made.  Adjust pain medications as deemed appropriate.  She should also receive bowel regimen with it.  2. HTN.  Continue lisinopril  3. Dyslipidemia. Continue rosuvastatin.   4. Osteoporosis. Continue raloxifen per home regimen.   5. Depression and anxiety. Continue with citalopram and alprazolam.    Body mass index is 28.63 kg/m.         Discharge Diagnoses:  Principal Problem:   Closed fracture of left inferior pubic ramus (HCC) Active Problems:   Dyslipidemia   Essential hypertension    Closed fracture of left superior pubic ramus (HCC)   Left hip pain   GAD (generalized anxiety disorder)   Subjective: Still very limited mobility but pain medication is helping for her to participate in therapy.  Discharge Exam: Vitals:   08/03/20 0605 08/03/20 0840  BP: 115/70 133/72  Pulse: 97 (!) 101  Resp: 16 16  Temp: 99 F (37.2 C) 98.8 F (37.1 C)  SpO2: 98% 95%   Vitals:   08/02/20 1557 08/02/20 2059 08/03/20 0605 08/03/20 0840  BP: 101/82 123/72 115/70 133/72  Pulse: (!) 103 (!) 102 97 (!) 101  Resp: 17  16 16   Temp: 98.5 F (36.9 C) 99.6 F (37.6 C) 99 F (37.2 C) 98.8 F (37.1 C)  TempSrc: Oral Oral Oral Oral  SpO2: 99% 99% 98% 95%  Weight:      Height:        General: Pt is alert, awake, not in acute distress Cardiovascular: RRR, S1/S2 +, no rubs, no gallops Respiratory: CTA bilaterally, no wheezing, no rhonchi Abdominal: Soft, NT, ND, bowel sounds + Extremities: no edema, no cyanosis  Discharge Instructions   Allergies as of 08/03/2020      Reactions   Codeine Nausea And Vomiting   Penicillins Rash      Medication List    TAKE these medications   acetaminophen 325 MG tablet Commonly known as: TYLENOL Take 650 mg by mouth every 6 (six) hours as needed for mild pain, moderate pain or fever.   ALPRAZolam 0.5 MG tablet Commonly known as: XANAX Take 1 tablet (0.5 mg total) by mouth daily as needed for anxiety. What changed: additional instructions  aspirin EC 81 MG tablet Take 81 mg daily by mouth.   citalopram 20 MG tablet Commonly known as: CELEXA Take 1 tablet (20 mg total) by mouth daily. What changed:   medication strength  how much to take   Cyanocobalamin 2500 MCG Chew Chew 2 tablets by mouth daily.   lisinopril 10 MG tablet Commonly known as: ZESTRIL Take 1 tablet (10 mg total) by mouth daily. What changed:   medication strength  how much to take   oxyCODONE 5 MG immediate release tablet Commonly known as: Oxy  IR/ROXICODONE Take 1-3 tablets (5-15 mg total) by mouth every 4 (four) hours as needed (5mg  for mild pain, 10mg  for moderate pain, 15mg  for severe pain).   polyvinyl alcohol 1.4 % ophthalmic solution Commonly known as: LIQUIFILM TEARS Place 1 drop into both eyes as needed for dry eyes.   raloxifene 60 MG tablet Commonly known as: EVISTA Take 1 tablet (60 mg total) by mouth daily.   Repatha SureClick 161 MG/ML Soaj Generic drug: Evolocumab Inject 1 Dose into the skin every 14 (fourteen) days.   rosuvastatin 40 MG tablet Commonly known as: CRESTOR Take 1 tablet (40 mg total) by mouth at bedtime.   senna-docusate 8.6-50 MG tablet Commonly known as: Senokot-S Take 2 tablets by mouth 2 (two) times daily as needed for mild constipation.   Vitamin D3 50 MCG (2000 UT) capsule Take 4,000 Units by mouth daily.            Durable Medical Equipment  (From admission, onward)         Start     Ordered   08/01/20 1209  For home use only DME 3 n 1  Once        08/01/20 1208   08/01/20 1209  For home use only DME Walker rolling  Once       Question Answer Comment  Walker: With [place] Wheels   Patient needs a walker to treat with the following condition Pelvic fracture ([place])      08/01/20 1208   08/01/20 1208  For home use only DME lightweight manual wheelchair with seat cushion  Once       Comments: Patient suffers from pelvic rami fracture which impairs their ability to perform daily activities like [id number] in the home.  A walking [id number] will not resolve  issue with performing activities of daily living. A wheelchair will allow patient to safely perform daily activities. Patient is not able to propel themselves in the home using a standard weight wheelchair due to [id number]. Patient can self propel in the lightweight wheelchair. Length of need 3 months. Accessories: elevating leg rests (ELRs), wheel locks, extensions and anti-tippers.   08/01/20 1208           Follow-up Information    [facility], [facility] Follow up.   Specialty: Home Health Services Why: [facility] will be providing home health services for physical therapy, occupational therapy and an aide.  They will call you to set up home health services in the next 24-48 hours. Contact information: [address] [place] [address] [phone]        [facility], [facility] Follow up.   Why: [employer] will be shipping a wheelchair, rolling walker and 3:1 to your sister's home. Contact information: [address] [place] [address] [phone]        Colon Branch, MD. Schedule an appointment as soon as possible for a visit in 2  week(s).   Specialty: Internal Medicine Contact information: [address] [place] [address] [phone]              Allergies  Allergen Reactions  . Codeine Nausea And Vomiting  . Penicillins Rash    You were cared for by a hospitalist during your hospital stay. If you have any questions about your discharge medications or the care you received while you were in the hospital after you are discharged, you can call the unit and asked to speak with the hospitalist on call if the hospitalist that took care of you is not available. Once you are discharged, your primary care physician will handle any further medical issues. Please note that no refills for any discharge medications will be authorized once you are discharged, as it is imperative that you return to your primary care physician (or establish a relationship with a primary care physician if you do not have one) for your aftercare needs so that they can reassess your need for medications and monitor your lab values.   Procedures/Studies: DG Pelvis 1-2 Views  Result Date: 07/30/2020 CLINICAL DATA:  Status post fall. EXAM: PELVIS - 1-2 VIEW COMPARISON:  None. FINDINGS: Ill-defined fracture deformities are seen involving the superior and inferior  pubic rami on the left. These are of indeterminate age. There is no evidence of dislocation. No pelvic bone lesions are seen. IMPRESSION: 1. Fractures of the left superior and inferior pubic rami of indeterminate age. CT correlation is recommended. Electronically Signed   By: Virgina Norfolk M.D.   On: 07/30/2020 16:55   CT PELVIS WO CONTRAST  Addendum Date: 07/31/2020   ADDENDUM REPORT: 07/31/2020 23:25 ADDENDUM: Results were discussed with Wyn Quaker at 6:51 p.m. [place] on July 30, 2020. Electronically Signed   By: Virgina Norfolk M.D.   On: 07/31/2020 23:25   Result Date: 07/31/2020 CLINICAL DATA:  Status post fall. EXAM: CT PELVIS WITHOUT CONTRAST TECHNIQUE: Multidetector CT imaging of the pelvis was performed following the standard protocol without intravenous contrast. COMPARISON:  None. FINDINGS: Urinary Tract:  No abnormality visualized. Bowel:  Unremarkable visualized pelvic bowel loops. Vascular/Lymphatic: There is mild calcification of the abdominal aorta and bilateral common iliac arteries, without evidence of aneurysmal dilatation. No pathologically enlarged lymph nodes. Reproductive:  No mass or other significant abnormality Other:  None. Musculoskeletal: Acute fracture deformities are seen involving the posteromedial aspect of the left superior pubic ramus and left inferior pubic ramus. A mild amount of adjacent pelvic inflammatory fat stranding is seen. This is separated from the urinary bladder by a thin fat plane. IMPRESSION: Acute fractures of the left superior and left inferior pubic rami with small adjacent pelvic hematomas. Aortic Atherosclerosis (ICD10-I70.0). Electronically Signed: By: Virgina Norfolk M.D. On: 07/30/2020 18:53   DG FEMUR MIN 2 VIEWS LEFT  Result Date: 07/30/2020 CLINICAL DATA:  Status post fall. EXAM: LEFT FEMUR 2 VIEWS COMPARISON:  None. FINDINGS: Acute fracture deformities are seen involving the left superior and left inferior pubic rami. There is  no evidence of dislocation. Soft tissues are unremarkable. IMPRESSION: Acute fracture of the left superior and left inferior pubic rami. Electronically Signed   By: Virgina Norfolk M.D.   On: 07/30/2020 16:56     The results of significant diagnostics from this hospitalization (including imaging, microbiology, ancillary and laboratory) are listed below for reference.     Microbiology: Recent Results (from the past 240 hour(s))  Resp Panel by RT-PCR (Flu A&B, Covid) Nasopharyngeal  Swab     Status: None   Collection Time: 07/30/20  7:00 PM   Specimen: Nasopharyngeal Swab; Nasopharyngeal(NP) swabs in vial transport medium  Result Value Ref Range Status   SARS Coronavirus 2 by RT PCR NEGATIVE NEGATIVE Final    Comment: (NOTE) SARS-CoV-2 target nucleic acids are NOT DETECTED.  The SARS-CoV-2 RNA is generally detectable in upper respiratory specimens during the acute phase of infection. The lowest concentration of SARS-CoV-2 viral copies this assay can detect is 138 copies/mL. A negative result does not preclude SARS-Cov-2 infection and should not be used as the sole basis for treatment or other patient management decisions. A negative result may occur with  improper specimen collection/handling, submission of specimen other than nasopharyngeal swab, presence of viral mutation(s) within the areas targeted by this assay, and inadequate number of viral copies(<138 copies/mL). A negative result must be combined with clinical observations, patient history, and epidemiological information. The expected result is Negative.  Fact Sheet for Patients:  EntrepreneurPulse.com.au  Fact Sheet for Healthcare Providers:  IncredibleEmployment.be  This test is no t yet approved or cleared by the [place] FDA and  has been authorized for detection and/or diagnosis of SARS-CoV-2 by FDA under an Emergency Use Authorization (EUA). This EUA will remain  in effect  (meaning this test can be used) for the duration of the COVID-19 declaration under Section 564(b)(1) of the Act, 21 U.S.C.section 360bbb-3(b)(1), unless the authorization is terminated  or revoked sooner.       Influenza A by PCR NEGATIVE NEGATIVE Final   Influenza B by PCR NEGATIVE NEGATIVE Final    Comment: (NOTE) The Xpert Xpress SARS-CoV-2/FLU/RSV plus assay is intended as an aid in the diagnosis of influenza from Nasopharyngeal swab specimens and should not be used as a sole basis for treatment. Nasal washings and aspirates are unacceptable for Xpert Xpress SARS-CoV-2/FLU/RSV testing.  Fact Sheet for Patients: EntrepreneurPulse.com.au  Fact Sheet for Healthcare Providers: IncredibleEmployment.be  This test is not yet approved or cleared by the [place] FDA and has been authorized for detection and/or diagnosis of SARS-CoV-2 by FDA under an Emergency Use Authorization (EUA). This EUA will remain in effect (meaning this test can be used) for the duration of the COVID-19 declaration under Section 564(b)(1) of the Act, 21 U.S.C. section 360bbb-3(b)(1), unless the authorization is terminated or revoked.  Performed at [facility], [place] [address] [place], [place] [address]      Labs: BNP (last 3 results) No results for input(s): BNP in the last 8760 hours. Basic Metabolic Panel: Recent Labs  Lab 07/30/20 1900 07/31/20 0433 08/02/20 0325 08/03/20 0352  NA 134* 134* 136 138  K 4.2 4.4 3.7 4.3  CL 101 99 100 100  CO2 22 25 26 28   GLUCOSE 112* 105* 115* 111*  BUN 13 11 11 12   CREATININE 1.01* 0.93 0.97 0.88  CALCIUM 9.2 9.0 9.0 9.5  MG  --  1.8 1.7 2.0   Liver Function Tests: Recent Labs  Lab 07/31/20 0433  AST 25  ALT 19  ALKPHOS 51  BILITOT 0.8  PROT 6.1*  ALBUMIN 3.4*   No results for input(s): LIPASE, AMYLASE in the last 168 hours. No results for input(s): AMMONIA in the last 168 hours. CBC: Recent Labs   Lab 07/30/20 1900 07/31/20 0433  WBC 9.8 5.8  NEUTROABS 8.6*  --   HGB 12.6 11.8*  HCT 38.5 36.4  MCV 96.3 96.3  PLT 171 149*   Cardiac Enzymes: No  results for input(s): CKTOTAL, CKMB, CKMBINDEX, TROPONINI in the last 168 hours. BNP: Invalid input(s): POCBNP CBG: No results for input(s): GLUCAP in the last 168 hours. D-Dimer No results for input(s): DDIMER in the last 72 hours. Hgb A1c No results for input(s): HGBA1C in the last 72 hours. Lipid Profile No results for input(s): CHOL, HDL, LDLCALC, TRIG, CHOLHDL, LDLDIRECT in the last 72 hours. Thyroid function studies No results for input(s): TSH, T4TOTAL, T3FREE, THYROIDAB in the last 72 hours.  Invalid input(s): FREET3 Anemia work up No results for input(s): VITAMINB12, FOLATE, FERRITIN, TIBC, IRON, RETICCTPCT in the last 72 hours. Urinalysis    Component Value Date/Time   COLORURINE YELLOW 03/29/2012 2305   APPEARANCEUR CLEAR 03/29/2012 2305   LABSPEC 1.012 03/29/2012 2305   PHURINE 6.0 03/29/2012 2305   GLUCOSEU NEGATIVE 03/29/2012 2305   HGBUR NEGATIVE 03/29/2012 2305   BILIRUBINUR positive +1 04/30/2016 1413   KETONESUR NEGATIVE 03/29/2012 2305   PROTEINUR positive 04/30/2016 1413   PROTEINUR NEGATIVE 03/29/2012 2305   UROBILINOGEN 2.0 04/30/2016 1413   UROBILINOGEN 0.2 03/29/2012 2305   NITRITE positive 04/30/2016 1413   NITRITE NEGATIVE 03/29/2012 2305   LEUKOCYTESUR small (1+) (A) 04/30/2016 1413   Sepsis Labs Invalid input(s): PROCALCITONIN,  WBC,  LACTICIDVEN Microbiology Recent Results (from the past 240 hour(s))  Resp Panel by RT-PCR (Flu A&B, Covid) Nasopharyngeal Swab     Status: None   Collection Time: 07/30/20  7:00 PM   Specimen: Nasopharyngeal Swab; Nasopharyngeal(NP) swabs in vial transport medium  Result Value Ref Range Status   SARS Coronavirus 2 by RT PCR NEGATIVE NEGATIVE Final    Comment: (NOTE) SARS-CoV-2 target nucleic acids are NOT DETECTED.  The SARS-CoV-2 RNA is generally  detectable in upper respiratory specimens during the acute phase of infection. The lowest concentration of SARS-CoV-2 viral copies this assay can detect is 138 copies/mL. A negative result does not preclude SARS-Cov-2 infection and should not be used as the sole basis for treatment or other patient management decisions. A negative result may occur with  improper specimen collection/handling, submission of specimen other than nasopharyngeal swab, presence of viral mutation(s) within the areas targeted by this assay, and inadequate number of viral copies(<138 copies/mL). A negative result must be combined with clinical observations, patient history, and epidemiological information. The expected result is Negative.  Fact Sheet for Patients:  EntrepreneurPulse.com.au  Fact Sheet for Healthcare Providers:  IncredibleEmployment.be  This test is no t yet approved or cleared by the [place] FDA and  has been authorized for detection and/or diagnosis of SARS-CoV-2 by FDA under an Emergency Use Authorization (EUA). This EUA will remain  in effect (meaning this test can be used) for the duration of the COVID-19 declaration under Section 564(b)(1) of the Act, 21 U.S.C.section 360bbb-3(b)(1), unless the authorization is terminated  or revoked sooner.       Influenza A by PCR NEGATIVE NEGATIVE Final   Influenza B by PCR NEGATIVE NEGATIVE Final    Comment: (NOTE) The Xpert Xpress SARS-CoV-2/FLU/RSV plus assay is intended as an aid in the diagnosis of influenza from Nasopharyngeal swab specimens and should not be used as a sole basis for treatment. Nasal washings and aspirates are unacceptable for Xpert Xpress SARS-CoV-2/FLU/RSV testing.  Fact Sheet for Patients: EntrepreneurPulse.com.au  Fact Sheet for Healthcare Providers: IncredibleEmployment.be  This test is not yet approved or cleared by the [place] FDA  and has been authorized for detection and/or diagnosis of SARS-CoV-2 by FDA under an Emergency Use Authorization (EUA).  This EUA will remain in effect (meaning this test can be used) for the duration of the COVID-19 declaration under Section 564(b)(1) of the Act, 21 U.S.C. section 360bbb-3(b)(1), unless the authorization is terminated or revoked.  Performed at [facility], [place] [address], [place], [place] [address]      Time coordinating discharge:  I have spent 35 minutes face to face with the patient and on the ward discussing the patients care, assessment, plan and disposition with other care givers. >50% of the time was devoted counseling the patient about the risks and benefits of treatment/Discharge disposition and coordinating care.   SIGNED:   Damita Lack, MD  [facility] 08/03/2020, 11:37 AM   If 7PM-7AM, please contact night-coverage

## 2020-08-02 NOTE — Plan of Care (Signed)
  Problem: Clinical Measurements: Goal: Respiratory complications will improve Outcome: Progressing Goal: Cardiovascular complication will be avoided Outcome: Progressing   Problem: Activity: Goal: Risk for activity intolerance will decrease Outcome: Progressing   Problem: Nutrition: Goal: Adequate nutrition will be maintained Outcome: Progressing   Problem: Coping: Goal: Level of anxiety will decrease Outcome: Progressing   Problem: Elimination: Goal: Will not experience complications related to urinary retention Outcome: Progressing   Problem: Pain Managment: Goal: General experience of comfort will improve Outcome: Progressing   Problem: Safety: Goal: Ability to remain free from injury will improve Outcome: Progressing

## 2020-08-03 ENCOUNTER — Telehealth: Payer: Self-pay

## 2020-08-03 ENCOUNTER — Inpatient Hospital Stay (HOSPITAL_COMMUNITY)
Admission: RE | Admit: 2020-08-03 | Discharge: 2020-08-14 | DRG: 560 | Disposition: A | Discharge status: Home Health | Payer: Medicare Other | Source: Intra-hospital | Attending: Physical Medicine & Rehabilitation | Admitting: Physical Medicine & Rehabilitation

## 2020-08-03 DIAGNOSIS — R000 Tachycardia, unspecified: Secondary | ICD-10-CM | POA: Diagnosis present

## 2020-08-03 DIAGNOSIS — S32301S Unspecified fracture of right ilium, sequela: Secondary | ICD-10-CM | POA: Diagnosis not present

## 2020-08-03 DIAGNOSIS — Z923 Personal history of irradiation: Secondary | ICD-10-CM

## 2020-08-03 DIAGNOSIS — S329XXS Fracture of unspecified parts of lumbosacral spine and pelvis, sequela: Secondary | ICD-10-CM

## 2020-08-03 DIAGNOSIS — K5901 Slow transit constipation: Secondary | ICD-10-CM | POA: Diagnosis present

## 2020-08-03 DIAGNOSIS — Z9221 Personal history of antineoplastic chemotherapy: Secondary | ICD-10-CM

## 2020-08-03 DIAGNOSIS — R102 Pelvic and perineal pain: Secondary | ICD-10-CM

## 2020-08-03 DIAGNOSIS — Z880 Allergy status to penicillin: Secondary | ICD-10-CM

## 2020-08-03 DIAGNOSIS — S32592D Other specified fracture of left pubis, subsequent encounter for fracture with routine healing: Principal | ICD-10-CM

## 2020-08-03 DIAGNOSIS — Z87891 Personal history of nicotine dependence: Secondary | ICD-10-CM | POA: Diagnosis not present

## 2020-08-03 DIAGNOSIS — J449 Chronic obstructive pulmonary disease, unspecified: Secondary | ICD-10-CM | POA: Diagnosis present

## 2020-08-03 DIAGNOSIS — M19041 Primary osteoarthritis, right hand: Secondary | ICD-10-CM | POA: Diagnosis present

## 2020-08-03 DIAGNOSIS — M19042 Primary osteoarthritis, left hand: Secondary | ICD-10-CM | POA: Diagnosis present

## 2020-08-03 DIAGNOSIS — E7849 Other hyperlipidemia: Secondary | ICD-10-CM | POA: Diagnosis present

## 2020-08-03 DIAGNOSIS — M810 Age-related osteoporosis without current pathological fracture: Secondary | ICD-10-CM | POA: Diagnosis present

## 2020-08-03 DIAGNOSIS — Z79899 Other long term (current) drug therapy: Secondary | ICD-10-CM

## 2020-08-03 DIAGNOSIS — D62 Acute posthemorrhagic anemia: Secondary | ICD-10-CM | POA: Diagnosis present

## 2020-08-03 DIAGNOSIS — R739 Hyperglycemia, unspecified: Secondary | ICD-10-CM

## 2020-08-03 DIAGNOSIS — Z85820 Personal history of malignant melanoma of skin: Secondary | ICD-10-CM | POA: Diagnosis not present

## 2020-08-03 DIAGNOSIS — Z885 Allergy status to narcotic agent status: Secondary | ICD-10-CM

## 2020-08-03 DIAGNOSIS — R32 Unspecified urinary incontinence: Secondary | ICD-10-CM | POA: Diagnosis not present

## 2020-08-03 DIAGNOSIS — F411 Generalized anxiety disorder: Secondary | ICD-10-CM | POA: Diagnosis present

## 2020-08-03 DIAGNOSIS — Z85118 Personal history of other malignant neoplasm of bronchus and lung: Secondary | ICD-10-CM

## 2020-08-03 DIAGNOSIS — I10 Essential (primary) hypertension: Secondary | ICD-10-CM | POA: Diagnosis present

## 2020-08-03 DIAGNOSIS — Z9049 Acquired absence of other specified parts of digestive tract: Secondary | ICD-10-CM | POA: Diagnosis not present

## 2020-08-03 DIAGNOSIS — W19XXXD Unspecified fall, subsequent encounter: Secondary | ICD-10-CM | POA: Diagnosis present

## 2020-08-03 DIAGNOSIS — S32512D Fracture of superior rim of left pubis, subsequent encounter for fracture with routine healing: Secondary | ICD-10-CM

## 2020-08-03 DIAGNOSIS — S329XXA Fracture of unspecified parts of lumbosacral spine and pelvis, initial encounter for closed fracture: Secondary | ICD-10-CM | POA: Diagnosis present

## 2020-08-03 DIAGNOSIS — Z23 Encounter for immunization: Secondary | ICD-10-CM

## 2020-08-03 DIAGNOSIS — Z7982 Long term (current) use of aspirin: Secondary | ICD-10-CM

## 2020-08-03 LAB — BASIC METABOLIC PANEL
Anion gap: 10 (ref 5–15)
BUN: 12 mg/dL (ref 8–23)
CO2: 28 mmol/L (ref 22–32)
Calcium: 9.5 mg/dL (ref 8.9–10.3)
Chloride: 100 mmol/L (ref 98–111)
Creatinine, Ser: 0.88 mg/dL (ref 0.44–1.00)
GFR, Estimated: >60 mL/min (ref >60)
Glucose, Bld: 111 mg/dL — ABNORMAL HIGH (ref 70–99)
Potassium: 4.3 mmol/L (ref 3.5–5.1)
Sodium: 138 mmol/L (ref 135–145)

## 2020-08-03 LAB — MAGNESIUM: Magnesium: 2 mg/dL (ref 1.7–2.4)

## 2020-08-03 MED ORDER — TRAMADOL HCL 50 MG PO TABS
50.0000 mg | ORAL_TABLET | Freq: Four times a day (QID) | ORAL | Status: DC | PRN
  Administered 2020-08-03 – 2020-08-14 (×12): 50 mg via ORAL
  Filled 2020-08-03 (×13): qty 1

## 2020-08-03 MED ORDER — VITAMIN D 25 MCG (1000 UNIT) PO TABS
4000.0000 [IU] | ORAL_TABLET | Freq: Every day | ORAL | Status: DC
  Administered 2020-08-04 – 2020-08-14 (×11): 4000 [IU] via ORAL
  Filled 2020-08-03 (×11): qty 4

## 2020-08-03 MED ORDER — VITAMIN B-12 1000 MCG PO TABS
1000.0000 ug | ORAL_TABLET | Freq: Every day | ORAL | Status: DC
  Administered 2020-08-04 – 2020-08-14 (×11): 1000 ug via ORAL
  Filled 2020-08-03 (×11): qty 1

## 2020-08-03 MED ORDER — ALUM & MAG HYDROXIDE-SIMETH 200-200-20 MG/5ML PO SUSP: 30.0000 mL | ORAL | Status: DC | PRN

## 2020-08-03 MED ORDER — PROCHLORPERAZINE EDISYLATE 10 MG/2ML IJ SOLN: 5.0000 mg | Freq: Four times a day (QID) | INTRAMUSCULAR | Status: DC | PRN

## 2020-08-03 MED ORDER — FLEET ENEMA 7-19 GM/118ML RE ENEM: 1.0000 | ENEMA | Freq: Once | RECTAL | Status: DC | PRN

## 2020-08-03 MED ORDER — DIPHENHYDRAMINE HCL 12.5 MG/5ML PO ELIX: 12.5000 mg | ORAL_SOLUTION | Freq: Four times a day (QID) | ORAL | Status: DC | PRN

## 2020-08-03 MED ORDER — SENNOSIDES-DOCUSATE SODIUM 8.6-50 MG PO TABS
2.0000 | ORAL_TABLET | Freq: Every day | ORAL | Status: DC
  Administered 2020-08-03 – 2020-08-08 (×6): 2 via ORAL
  Filled 2020-08-03 (×10): qty 2

## 2020-08-03 MED ORDER — GUAIFENESIN-DM 100-10 MG/5ML PO SYRP: 5.0000 mL | ORAL_SOLUTION | Freq: Four times a day (QID) | ORAL | Status: DC | PRN

## 2020-08-03 MED ORDER — POLYETHYLENE GLYCOL 3350 17 G PO PACK
17.0000 g | PACK | Freq: Every day | ORAL | Status: DC | PRN
Start: 2020-08-03 — End: 2020-08-06
  Administered 2020-08-04 – 2020-08-05 (×2): 17 g via ORAL
  Filled 2020-08-03 (×2): qty 1

## 2020-08-03 MED ORDER — RALOXIFENE HCL 60 MG PO TABS
60.0000 mg | ORAL_TABLET | Freq: Every day | ORAL | Status: DC
  Filled 2020-08-03: qty 1

## 2020-08-03 MED ORDER — ROSUVASTATIN CALCIUM 20 MG PO TABS
40.0000 mg | ORAL_TABLET | Freq: Every day | ORAL | Status: DC
  Filled 2020-08-03: qty 2

## 2020-08-03 MED ORDER — PROCHLORPERAZINE MALEATE 5 MG PO TABS: 5.0000 mg | ORAL_TABLET | Freq: Four times a day (QID) | ORAL | Status: DC | PRN

## 2020-08-03 MED ORDER — NALOXONE HCL 0.4 MG/ML IJ SOLN: 0.4000 mg | INTRAMUSCULAR | Status: DC | PRN

## 2020-08-03 MED ORDER — ENOXAPARIN SODIUM 40 MG/0.4ML ~~LOC~~ SOLN
40.0000 mg | SUBCUTANEOUS | Status: DC
  Administered 2020-08-03 – 2020-08-13 (×11): 40 mg via SUBCUTANEOUS
  Filled 2020-08-03 (×11): qty 0.4

## 2020-08-03 MED ORDER — CITALOPRAM HYDROBROMIDE 20 MG PO TABS
20.0000 mg | ORAL_TABLET | Freq: Every day | ORAL | Status: DC
  Administered 2020-08-04 – 2020-08-14 (×11): 20 mg via ORAL
  Filled 2020-08-03 (×11): qty 1

## 2020-08-03 MED ORDER — BISACODYL 10 MG RE SUPP: 10.0000 mg | Freq: Every day | RECTAL | Status: DC | PRN

## 2020-08-03 MED ORDER — ALPRAZOLAM 0.5 MG PO TABS: 0.5000 mg | ORAL_TABLET | Freq: Every day | ORAL | Status: DC | PRN

## 2020-08-03 MED ORDER — PROCHLORPERAZINE 25 MG RE SUPP: 12.5000 mg | Freq: Four times a day (QID) | RECTAL | Status: DC | PRN

## 2020-08-03 MED ORDER — LISINOPRIL 10 MG PO TABS
10.0000 mg | ORAL_TABLET | Freq: Every day | ORAL | Status: DC
  Administered 2020-08-04: 09:00:00 10 mg via ORAL
  Filled 2020-08-03: qty 1

## 2020-08-03 MED ORDER — TRAZODONE HCL 50 MG PO TABS
25.0000 mg | ORAL_TABLET | Freq: Every evening | ORAL | Status: DC | PRN
  Administered 2020-08-11 – 2020-08-13 (×2): 50 mg via ORAL
  Filled 2020-08-03 (×2): qty 1

## 2020-08-03 MED ORDER — OXYCODONE HCL 5 MG PO TABS
5.0000 mg | ORAL_TABLET | ORAL | Status: DC | PRN
  Administered 2020-08-05 – 2020-08-07 (×4): 10 mg via ORAL
  Administered 2020-08-08 – 2020-08-13 (×2): 5 mg via ORAL
  Filled 2020-08-03 (×4): qty 2
  Filled 2020-08-03: qty 1
  Filled 2020-08-03 (×5): qty 2
  Filled 2020-08-03: qty 1

## 2020-08-03 MED ORDER — DM-GUAIFENESIN ER 30-600 MG PO TB12: 1.0000 | ORAL_TABLET | Freq: Two times a day (BID) | ORAL | Status: DC | PRN

## 2020-08-03 MED ORDER — METHOCARBAMOL 500 MG PO TABS
500.0000 mg | ORAL_TABLET | Freq: Four times a day (QID) | ORAL | Status: DC | PRN
  Filled 2020-08-03: qty 1

## 2020-08-03 MED ORDER — ACETAMINOPHEN 325 MG PO TABS
325.0000 mg | ORAL_TABLET | ORAL | Status: DC | PRN
  Administered 2020-08-04 – 2020-08-12 (×14): 650 mg via ORAL
  Filled 2020-08-03 (×14): qty 2

## 2020-08-03 NOTE — Plan of Care (Signed)
Patient in no acute distress. All needs addressed. Denies any pain. Problem: Education: Goal: Knowledge of General Education information will improve Description: Including pain rating scale, medication(s)/side effects and non-pharmacologic comfort measures Outcome: Progressing   Problem: Health Behavior/Discharge Planning: Goal: Ability to manage health-related needs will improve Outcome: Progressing   Problem: Clinical Measurements: Goal: Ability to maintain clinical measurements within normal limits will improve Outcome: Progressing Goal: Will remain free from infection Outcome: Progressing Goal: Diagnostic test results will improve Outcome: Progressing Goal: Respiratory complications will improve Outcome: Progressing Goal: Cardiovascular complication will be avoided Outcome: Progressing   Problem: Activity: Goal: Risk for activity intolerance will decrease Outcome: Progressing   Problem: Nutrition: Goal: Adequate nutrition will be maintained Outcome: Progressing   Problem: Coping: Goal: Level of anxiety will decrease Outcome: Progressing   Problem: Elimination: Goal: Will not experience complications related to bowel motility Outcome: Progressing Goal: Will not experience complications related to urinary retention Outcome: Progressing   Problem: Pain Managment: Goal: General experience of comfort will improve Outcome: Progressing   Problem: Safety: Goal: Ability to remain free from injury will improve Outcome: Progressing   Problem: Skin Integrity: Goal: Risk for impaired skin integrity will decrease Outcome: Progressing

## 2020-08-03 NOTE — Progress Notes (Signed)
Pt called nurse requesting help to get back to bed from chair. Nurse explained she was with another pt and would be available in 10-15 minutes, and that she would ask the NT to help since pt is 2 assist. Pt called back a second time after 15 minutes yelling and demanding nurse help her back to bed immediately. Nurse explained she was just leaving another pt's room and would be in shortly. Nurse and NT assisted pt back to bed. Agricultural consultant and AD notified.

## 2020-08-03 NOTE — Progress Notes (Signed)
IP rehab admissions:  I anticipate having a bed become available later today and will plan to admit patient later today to CIR.  Call me for questions.  [phone]

## 2020-08-03 NOTE — Progress Notes (Signed)
Report given to nurse on 4W.

## 2020-08-03 NOTE — Progress Notes (Signed)
Patient arrived to [facility] room 20. Pt is alert and oriented x 4. Pt vital signs are stable. Pt not showing any signs of discomfort or distress. Pt denies pain at this time. Call bell is within reach, bed alarm is on, bed is in low position and room is clutter free.

## 2020-08-03 NOTE — Progress Notes (Signed)
Physical Therapy Treatment Patient Details Name: [name] [name] MRN: [id number] DOB: [birth year] Today's Date: 08/03/2020    History of Present Illness Pt is a 69 y.o. female admitted 07/30/20 after fall onto L hip sustaining L superior and L inferior pubic rami fxs without evidence of dislocation. Per ortho consult, no need for surgical intervention, to be WBAT. PMH includes stage 3 lung CA s/p chemoradiation (2013), HTN, anxiety disorder.    PT Comments    Pt supine on arrival, anxious but agreeable to therapy session after premedication and with good participation and fair tolerance for mobility. Pt limited due to LLE pain when attempting steps on RLE and unable to progress gait training this session, but pt able to pivot to chair using RW with +6modA. Pt progressed bed mobility with minA, decreased assist from previous session. Pt performed supine/seated BLE AROM therapeutic exercises with good tolerance, and given HEP handout (link: [email or web address] Access Code: [email or web address]) and encouraged to perform TID as able. Pt will continue to benefit from skilled rehab in a high intensity post-acute setting to maximize functional gains before returning home.   Follow Up Recommendations  CIR;Supervision for mobility/OOB     Equipment Recommendations  Rolling walker with 5" wheels;3in1 (PT);Wheelchair (measurements PT);Wheelchair cushion (measurements PT)    Recommendations for Other Services       Precautions / Restrictions Precautions Precautions: Fall;Other (comment) Precaution Comments: urinary urgency; very fearful of pain in LLE Restrictions Weight Bearing Restrictions: Yes RLE Weight Bearing: Weight bearing as tolerated LLE Weight Bearing: Weight bearing as tolerated    Mobility  Bed Mobility Overal bed mobility: Needs Assistance Bed Mobility: Rolling;Sidelying to Sit Rolling: Supervision Sidelying to sit: Min assist       General bed mobility comments: much  improved BLE movement in bed, pt with heavy use of bed rail and minA for hip translation to EOB and trunk assist  Transfers Overall transfer level: Needs assistance Equipment used: Rolling walker (2 wheeled) Transfers: Sit to/from Stand Sit to Stand: Mod assist;+2 safety/equipment Stand pivot transfers: Mod assist;+2 safety/equipment;+2 physical assistance       General transfer comment: x2 attempts to stand with pt returning to sit due to pain, able to stand on 2nd trial with RW and modA for trunk elevation; increased time and effort due to pain, cues for hand placement. After working on taking steps, pt unable to turn completely towards chair, requiring +23modA to pivot to recliner to complete safe transfer; RN/pt notified they may need to try lateral scooting for return transfer (pt in drop arm chair) vs Stedy  Ambulation/Gait             General Gait Details: pt unable to accept weight on LLE to take step with RLE, pt able to pivot only on RLE   Stairs             Wheelchair Mobility    Modified Rankin (Stroke Patients Only)       Balance Overall balance assessment: Needs assistance Sitting-balance support: No upper extremity supported;Feet supported Sitting balance-Leahy Scale: Fair     Standing balance support: Bilateral upper extremity supported;During functional activity Standing balance-Leahy Scale: Poor Standing balance comment: Reliant on UE support and external assist                            Cognition Arousal/Alertness: Awake/alert Behavior During Therapy: WFL for tasks assessed/performed;Anxious Overall Cognitive Status: No family/caregiver present to determine baseline cognitive functioning Area  of Impairment: Attention;Following commands;Problem solving                   Current Attention Level: Selective   Following Commands: Follows multi-step commands inconsistently     Problem Solving: Requires verbal cues General  Comments: Suspect apparent cognitive impairment related to pt very distracted by pain requiring frequent cues for task and redirection, especially when attempting to take steps. Self-limiting by pain      Exercises General Exercises - Lower Extremity Ankle Circles/Pumps: AROM;Both;Seated Quad Sets: AROM;Strengthening;Both;10 reps;Supine Gluteal Sets: AROM;Strengthening;Both;10 reps;Supine Long Arc Quad: AROM;Both;10 reps;Seated Heel Slides: AROM;Both;5 reps;Supine Hip Flexion/Marching: AROM;Strengthening;Both;10 reps;Seated    General Comments General comments (skin integrity, edema, etc.): pt requesting purewick be placed, RN/NT notified      Pertinent Vitals/Pain Pain Assessment: 0-10 Pain Score: 6  Faces Pain Scale: Hurts whole lot Pain Location: L hip/pelvic area (especially with WB) > RLE Pain Descriptors / Indicators: Discomfort;Grimacing;Guarding;Moaning (yelling) Pain Intervention(s): Monitored during session;Premedicated before session;Repositioned;Ice applied (medicated ~20 mins prior)   Vitals:   08/03/20 0840  BP: 133/72  Pulse: (!) 101  Resp: 16  Temp: 98.8 F (37.1 C)  SpO2: 95%    Home Living                      Prior Function            PT Goals (current goals can now be found in the care plan section) Acute Rehab PT Goals Patient Stated Goal: be able to walk again, get home to dog PT Goal Formulation: With patient Time For Goal Achievement: 08/14/20 Potential to Achieve Goals: Good Progress towards PT goals: Progressing toward goals (slow progress)    Frequency    Min 5X/week      PT Plan Current plan remains appropriate    Co-evaluation              AM-PAC PT "6 Clicks" Mobility   Outcome Measure  Help needed turning from your back to your side while in a flat bed without using bedrails?: A Little Help needed moving from lying on your back to sitting on the side of a flat bed without using bedrails?: A Little Help  needed moving to and from a bed to a chair (including a wheelchair)?: A Lot Help needed standing up from a chair using your arms (e.g., wheelchair or bedside chair)?: A Lot Help needed to walk in hospital room?: Total Help needed climbing 3-5 steps with a railing? : Total 6 Click Score: 12    End of Session Equipment Utilized During Treatment: Gait belt Activity Tolerance: Patient tolerated treatment well;Patient limited by pain Patient left: in chair;with call bell/phone within reach;with chair alarm set Nurse Communication: Mobility status PT Visit Diagnosis: Other abnormalities of gait and mobility (R26.89);Muscle weakness (generalized) (M62.81)     Time: 1610-9604 PT Time Calculation (min) (ACUTE ONLY): 20 min  Charges:  $Therapeutic Exercise: 8-22 mins                     Sheily Lineman P., PTA Acute Rehabilitation Services Pager: [phone] Office: [place] 08/03/2020, 12:56 PM

## 2020-08-03 NOTE — H&P (Incomplete)
Physical Medicine and Rehabilitation Admission H&P    Chief Complaint  Patient presents with  . Functional deficits due to pelvic Fx.     HPI: [name] [name] is a 69 year old female with history of HTN, stage III lung cancer s/p chemo/XRT, OA, anemia, anxiety d/o who was admitted on 07/30/20 after a fall off a stool due to LOB with onset of acute left hip pain with difficulty ambulating. She was found to have acute left superior and inferior pubic rami Fx with small adjacent pelvic hematomas. Ortho recommended WBAT and to follow up with Dr. Percell Miller in 2-3 weeks. Hospital course significant for issues with difficulty WB LLE due to  pain, weakness, hypotension as well as tachycardia.  Lisinopril was decreased to 10 mg daily and Celexa decreased due to age?Marland Kitchen  Therapy ongoing and CIR recommended due to functional deficits.    Review of Systems  Constitutional: Negative for chills and fever.  HENT: Positive for tinnitus (occasionally at nights). Negative for hearing loss.   Eyes: Negative for blurred vision and double vision.  Respiratory: Negative for cough.   Cardiovascular: Negative for chest pain and palpitations.  Gastrointestinal: Positive for constipation (NO BM since admission) and diarrhea (with certain foods). Negative for heartburn and nausea.  Genitourinary: Negative for dysuria and urgency.  Musculoskeletal: Positive for joint pain and myalgias.  Skin: Negative for itching and rash.  Neurological: Positive for weakness. Negative for dizziness and headaches.     Past Medical History:  Diagnosis Date  . Anemia    duering cancer treatment   . Anxiety   . Arthritis    OA hands   . Bilateral lower extremity pain   . Blood transfusion without reported diagnosis    during chemo for lung cancer  . Cataract    forming  but very small   . Cholelithiasis   . Colon polyps    adenomatous  . High cholesterol   . History of chemotherapy    for lung cancer- completed  2013  . Hx of radiation therapy 10/07/11 to 11/20/11   right lung  . Hx of radiation therapy 01/07/2012- 01/21/12   cranial irradiation  . Hypertension   . Lung cancer ([place]) 09/19/11   Stage III currently in remission  . Malignant melanoma of skin of canthus of right eye ([place]) 02/2017  . Osteoporosis 04/2014   Dexa scan by Dr. Corinna Capra, T-score -2.7, next in 2 years, with at least 13 % major fracture in 10 years  . Pitting edema    Bilateral lower extremities, duplex ultrasound 04/03/2015 normal, no DVT    Past Surgical History:  Procedure Laterality Date  . CHOLECYSTECTOMY  04/2011   biliary stent placement  . COLONOSCOPY    . [place]  2015  . MOHS SURGERY Right 02/2017   cheek and eyelid  . Josph Macho Touch     Vaginal procedure  . POLYPECTOMY    . TUBAL LIGATION      Family History  Problem Relation Age of Onset  . Bladder Cancer Father   . Stroke Father   . Colon polyps Father   . Atrial fibrillation Mother   . Heart disease Mother        CHF  . Clotting disorder Mother        warfarin  . Colon polyps Mother   . Colon cancer Maternal Aunt 60  . Drug abuse Son   . Breast cancer Neg Hx   .  Esophageal cancer Neg Hx   . Stomach cancer Neg Hx   . Rectal cancer Neg Hx     Social History:  Divorced twice--has worked multiple Clinical cytogeneticist jobs. She lives alone and independent PTA without use of AD. Current plans are for d/c to sister's home in [place].  She  reports that she quit smoking about 8 years ago. Her smoking use included cigarettes. She has never used smokeless tobacco. She reports current alcohol use. She reports that she does not use drugs.   Allergies  Allergen Reactions  . Codeine Nausea And Vomiting  . Penicillins Rash    Medications Prior to Admission  Medication Sig Dispense Refill  . acetaminophen (TYLENOL) 325 MG tablet Take 650 mg by mouth every 6 (six) hours as needed for mild pain, moderate pain or fever.    Marland Kitchen aspirin EC 81 MG  tablet Take 81 mg daily by mouth.    . Cholecalciferol (VITAMIN D3) 2000 UNITS capsule Take 4,000 Units by mouth daily.     . citalopram (CELEXA) 40 MG tablet Take 1 tablet (40 mg total) by mouth daily. 90 tablet 1  . Cyanocobalamin 2500 MCG CHEW Chew 2 tablets by mouth daily.    . Evolocumab (REPATHA SURECLICK) 235 MG/ML SOAJ Inject 1 Dose into the skin every 14 (fourteen) days. 2 pen 11  . lisinopril (ZESTRIL) 20 MG tablet Take 1 tablet (20 mg total) by mouth daily. 90 tablet 3  . polyvinyl alcohol (LIQUIFILM TEARS) 1.4 % ophthalmic solution Place 1 drop into both eyes as needed for dry eyes.    . raloxifene (EVISTA) 60 MG tablet Take 1 tablet (60 mg total) by mouth daily. 90 tablet 3  . rosuvastatin (CRESTOR) 40 MG tablet Take 1 tablet (40 mg total) by mouth at bedtime. 90 tablet 1    Drug Regimen Review { DRUG REGIMEN [id number]}  Home: Home Living Family/patient expects to be discharged to:: Private residence Living Arrangements: Alone Available Help at Discharge: Family,Available PRN/intermittently Type of Home: House (townhouse) Home Access: Level entry Home Layout: One level Bathroom Shower/Tub: Multimedia programmer: Handicapped height Bathroom Accessibility: Yes Home Equipment: Shower seat,Grab bars - tub/shower Additional Comments: Lives in accessible townhome with 1 dog. Sister lives 20-min away and can assist, but also has 5 dogs   Functional History: Prior Function Level of Independence: Independent Comments: Independent, active, does not work, enjoys time with dog  Functional Status:  Mobility: Bed Mobility Overal bed mobility: Needs Assistance Bed Mobility: Rolling,Sidelying to Sit Rolling: Supervision Sidelying to sit: Min assist Supine to sit: Mod assist General bed mobility comments: much improved BLE movement in bed, pt with heavy use of bed rail and minA for hip translation to EOB and trunk assist Transfers Overall transfer level: Needs  assistance Equipment used: Rolling walker (2 wheeled) Transfers: Sit to/from Stand Sit to Stand: Mod assist,+2 safety/equipment Stand pivot transfers: Mod assist,+2 safety/equipment,+2 physical assistance  Lateral/Scoot Transfers: Min assist General transfer comment: x2 attempts to stand with pt returning to sit due to pain, able to stand on 2nd trial with RW and modA for trunk elevation; increased time and effort due to pain, cues for hand placement. After working on taking steps, pt unable to turn completely towards chair, requiring +85modA to pivot to recliner to complete safe transfer; RN/pt notified they may need to try lateral scooting for return transfer (pt in drop arm chair) vs Stedy Ambulation/Gait Gait Distance (Feet): 1 Feet Assistive device: Rolling walker (2 wheeled) General Gait  Details: pt unable to accept weight on LLE to take step with RLE, pt able to pivot only on RLE Gait velocity: Decreased    ADL: ADL Overall ADL's : Needs assistance/impaired Eating/Feeding: Independent,Sitting Grooming: Set up,Sitting Grooming Details (indicate cue type and reason): Setup to brush teeth seated in recliner Upper Body Bathing: Set up,Sitting Lower Body Bathing: Moderate assistance,Sitting/lateral leans,Sit to/from stand Upper Body Dressing : Set up,Sitting Lower Body Dressing: Maximal assistance,Sitting/lateral leans,Sit to/from stand Lower Body Dressing Details (indicate cue type and reason): Max A for socks, unable to bring feet to self or bend to feet Toilet Transfer Details (indicate cue type and reason): Simulated to recliner. Unable to pivot with walker. Min A for squat/scoot transfer to drop arm recliner. will need drop arm BSC Toileting- Clothing Manipulation and Hygiene: Sitting/lateral lean,Sit to/from stand,Maximal assistance General ADL Comments: Pt limited by pain but able to progress further with premedication. Began instruction in compensatory strategies for  ADLs/transfers to maximize independence   Cognition: Cognition Overall Cognitive Status: No family/caregiver present to determine baseline cognitive functioning Orientation Level: Oriented X4 Cognition Arousal/Alertness: Awake/alert Behavior During Therapy: WFL for tasks assessed/performed,Anxious Overall Cognitive Status: No family/caregiver present to determine baseline cognitive functioning Area of Impairment: Attention,Following commands,Problem solving Current Attention Level: Selective Following Commands: Follows multi-step commands inconsistently Problem Solving: Requires verbal cues General Comments: Suspect apparent cognitive impairment related to pt very distracted by pain requiring frequent cues for task and redirection, especially when attempting to take steps. Self-limiting by pain   Blood pressure 133/72, pulse (!) 101, temperature 98.8 F (37.1 C), temperature source Oral, resp. rate 16, height 5\' 3"  (1.6 m), weight 73.3 kg, SpO2 95 %. Physical Exam Vitals and nursing note reviewed.  Constitutional:      Appearance: Normal appearance.  Neurological:     Mental Status: She is alert and oriented to person, place, and time.     Comments: Exuberant --complaining about quality of food. She was able to answer basic orientation questions but could not recall name of hospital without visual cues. She is able to follow basic commands without difficulty.      Results for orders placed or performed during the hospital encounter of 07/30/20 (from the past 48 hour(s))  Basic metabolic panel     Status: Abnormal   Collection Time: 08/02/20  3:25 AM  Result Value Ref Range   Sodium 136 135 - 145 mmol/L   Potassium 3.7 3.5 - 5.1 mmol/L   Chloride 100 98 - 111 mmol/L   CO2 26 22 - 32 mmol/L   Glucose, Bld 115 (H) 70 - 99 mg/dL    Comment: Glucose reference range applies only to samples taken after fasting for at least 8 hours.   BUN 11 8 - 23 mg/dL   Creatinine, Ser 0.97 0.44 -  1.00 mg/dL   Calcium 9.0 8.9 - 10.3 mg/dL   GFR, Estimated >60 >60 mL/min    Comment: (NOTE) Calculated using the CKD-EPI Creatinine Equation (2021)    Anion gap 10 5 - 15    Comment: Performed at [facility] [address], [place], [place] [address]  Magnesium     Status: None   Collection Time: 08/02/20  3:25 AM  Result Value Ref Range   Magnesium 1.7 1.7 - 2.4 mg/dL    Comment: Performed at [facility], [place] [address], [place], [place] [address]  Basic metabolic panel     Status: Abnormal   Collection Time: 08/03/20  3:52  AM  Result Value Ref Range   Sodium 138 135 - 145 mmol/L   Potassium 4.3 3.5 - 5.1 mmol/L   Chloride 100 98 - 111 mmol/L   CO2 28 22 - 32 mmol/L   Glucose, Bld 111 (H) 70 - 99 mg/dL    Comment: Glucose reference range applies only to samples taken after fasting for at least 8 hours.   BUN 12 8 - 23 mg/dL   Creatinine, Ser 0.88 0.44 - 1.00 mg/dL   Calcium 9.5 8.9 - 10.3 mg/dL   GFR, Estimated >60 >60 mL/min    Comment: (NOTE) Calculated using the CKD-EPI Creatinine Equation (2021)    Anion gap 10 5 - 15    Comment: Performed at [facility] [address] [place], [place] [address]  Magnesium     Status: None   Collection Time: 08/03/20  3:52 AM  Result Value Ref Range   Magnesium 2.0 1.7 - 2.4 mg/dL    Comment: Performed at [facility] [address], [place], [place] [address]   No results found.     Medical Problem List and Plan: 1.  *** secondary to ***  -patient may *** shower  -ELOS/Goals: *** 2.  Antithrombotics: -DVT/anticoagulation:  Pharmaceutical: Lovenox--added  -antiplatelet therapy: N/A 3. Pain Management: Oxycodone prn.  4. Mood: Team to order ego support. LCSW to follow for evaluation and support.   -antipsychotic agents: N/A 5. Neuropsych: This patient is capable of making decisions on her own behalf. 6. Skin/Wound Care: Routine pressure relief measure.  7. Fluids/Electrolytes/Nutrition: Monitor  I/O. Check lytes in am.  8. HTN: Monitor BP tid--continue Zestril daily 9. Tachycardia: Monitor for symptoms with activity.  10. Anxiety d/o: On Celexa 12. Constipation: Has not had BM since admission--will schedule Senna S.   ***  Bary Leriche, PA-C 08/03/2020

## 2020-08-03 NOTE — Telephone Encounter (Signed)
Spoke with the patient, provided some support, advised to schedule a visit when she is out of rehab

## 2020-08-03 NOTE — Telephone Encounter (Signed)
-----   Message from Gerilyn Nestle, RN sent at 08/02/2020  1:30 PM EST -----

## 2020-08-03 NOTE — Progress Notes (Signed)
Doing well no complaints.  Alternating between oxycodone and Tylenol for accomplishment of better pain control.  Vital signs remained stable.  Awaiting CIR placement.  Questions answered.  Spoke with TOC.  Gerlean Ren MD [facility]

## 2020-08-03 NOTE — Progress Notes (Signed)
Inpatient Rehabilitation Medication Review by a Pharmacist  A complete drug regimen review was completed for this patient to identify any potential clinically significant medication issues.  Clinically significant medication issues were identified:  yes   Type of Medication Issue Identified Description of Issue Urgent (address now) Non-Urgent (address on AM team rounds) Plan Plan Accepted by Provider? (Yes / No / Pending AM Rounds)  Drug Interaction(s) (clinically significant)       Duplicate Therapy       Allergy       No Medication Administration End Date       Incorrect Dose       Additional Drug Therapy Needed       Other  --Patient on Repatha at home. Not stocked at [facility], not on in acute admit --ASA 81mg  on discharge summary to continue, but not on during acute admit.  Non-urgent Consider restart at [facility] discharge Consider restart of ASA if indication for this Pending AM rounds    Name of provider notified for urgent issues identified: N/A  Provider Method of Notification: N/A   For non-urgent medication issues to be resolved on team rounds tomorrow morning a [facility] [place] was sent to: Reesa Chew, PA   Pharmacist comments:   Time spent performing this drug regimen review (minutes):  10  Jaisa Defino A. Levada Dy, PharmD, BCPS, FNKF Clinical Pharmacist [facility] Please utilize Amion for appropriate phone number to reach the unit pharmacist ([place])  08/03/2020 7:03 PM

## 2020-08-03 NOTE — H&P (Signed)
Physical Medicine and Rehabilitation Admission H&P    Chief Complaint  Patient presents with  . Functional deficits due to pelvic Fx.     HPI: [name] [name] is a 69 year old female with history of HTN, stage III lung cancer s/p chemo/XRT, OA, anemia, anxiety d/o who was admitted on 07/30/20 after a fall off a stool due to LOB with onset of acute left hip pain with difficulty ambulating. She was found to have acute left superior and inferior pubic rami Fx with small adjacent pelvic hematomas. Ortho recommended WBAT and to follow up with Dr. Percell Miller in 2-3 weeks. Hospital course significant for issues with difficulty WB LLE due to  pain, weakness, hypotension as well as tachycardia.  Lisinopril was decreased to 10 mg daily and Celexa decreased due to age?Marland Kitchen  Admitted to CIR with impaired mobility and ADLs secondary to closed pelvic fracture. She currently complains of being unable to use purewick and feeling urge to urinate, and of pain at fracture site.    Review of Systems  Constitutional: Negative for chills and fever.  HENT: Positive for tinnitus (occasionally at nights). Negative for hearing loss.   Eyes: Negative for blurred vision and double vision.  Respiratory: Negative for cough.   Cardiovascular: Negative for chest pain and palpitations.  Gastrointestinal: Positive for constipation (NO BM since admission) and diarrhea (with certain foods). Negative for heartburn and nausea.  Genitourinary: Negative for dysuria and urgency.  Musculoskeletal: Positive for joint pain and myalgias.  Skin: Negative for itching and rash.  Neurological: Positive for weakness. Negative for dizziness and headaches.     Past Medical History:  Diagnosis Date  . Anemia    duering cancer treatment   . Anxiety   . Arthritis    OA hands   . Bilateral lower extremity pain   . Blood transfusion without reported diagnosis    during chemo for lung cancer  . Cataract    forming  but very small    . Cholelithiasis   . Colon polyps    adenomatous  . High cholesterol   . History of chemotherapy    for lung cancer- completed 2013  . Hx of radiation therapy 10/07/11 to 11/20/11   right lung  . Hx of radiation therapy 01/07/2012- 01/21/12   cranial irradiation  . Hypertension   . Lung cancer ([place]) 09/19/11   Stage III currently in remission  . Malignant melanoma of skin of canthus of right eye ([facility]) 02/2017  . Osteoporosis 04/2014   Dexa scan by Dr. Corinna Capra, T-score -2.7, next in 2 years, with at least 13 % major fracture in 10 years  . Pitting edema    Bilateral lower extremities, duplex ultrasound 04/03/2015 normal, no DVT    Past Surgical History:  Procedure Laterality Date  . CHOLECYSTECTOMY  04/2011   biliary stent placement  . COLONOSCOPY    . [facility]  2015  . MOHS SURGERY Right 02/2017   cheek and eyelid  . Josph Macho Touch     Vaginal procedure  . POLYPECTOMY    . TUBAL LIGATION      Family History  Problem Relation Age of Onset  . Bladder Cancer Father   . Stroke Father   . Colon polyps Father   . Atrial fibrillation Mother   . Heart disease Mother        CHF  . Clotting disorder Mother        warfarin  . Colon polyps  Mother   . Colon cancer Maternal Aunt 84  . Drug abuse Son   . Breast cancer Neg Hx   . Esophageal cancer Neg Hx   . Stomach cancer Neg Hx   . Rectal cancer Neg Hx     Social History:  Divorced twice--has worked multiple Clinical cytogeneticist jobs. She lives alone and independent PTA without use of AD. Current plans are for d/c to sister's home in [place].  She  reports that she quit smoking about 8 years ago. Her smoking use included cigarettes. She has never used smokeless tobacco. She reports current alcohol use. She reports that she does not use drugs.   Allergies  Allergen Reactions  . Codeine Nausea And Vomiting  . Penicillins Rash    Medications Prior to Admission  Medication Sig Dispense Refill  . acetaminophen  (TYLENOL) 325 MG tablet Take 650 mg by mouth every 6 (six) hours as needed for mild pain, moderate pain or fever.    Marland Kitchen aspirin EC 81 MG tablet Take 81 mg daily by mouth.    . Cholecalciferol (VITAMIN D3) 2000 UNITS capsule Take 4,000 Units by mouth daily.     . citalopram (CELEXA) 40 MG tablet Take 1 tablet (40 mg total) by mouth daily. 90 tablet 1  . Cyanocobalamin 2500 MCG CHEW Chew 2 tablets by mouth daily.    . Evolocumab (REPATHA SURECLICK) 456 MG/ML SOAJ Inject 1 Dose into the skin every 14 (fourteen) days. 2 pen 11  . lisinopril (ZESTRIL) 20 MG tablet Take 1 tablet (20 mg total) by mouth daily. 90 tablet 3  . polyvinyl alcohol (LIQUIFILM TEARS) 1.4 % ophthalmic solution Place 1 drop into both eyes as needed for dry eyes.    . raloxifene (EVISTA) 60 MG tablet Take 1 tablet (60 mg total) by mouth daily. 90 tablet 3  . rosuvastatin (CRESTOR) 40 MG tablet Take 1 tablet (40 mg total) by mouth at bedtime. 90 tablet 1    Drug Regimen Review  Drug regimen was reviewed and remains appropriate with no significant issues identified    Home: Home Living Family/patient expects to be discharged to:: Private residence Living Arrangements: Alone Available Help at Discharge: Family,Available PRN/intermittently Type of Home: House (townhouse) Home Access: Level entry Home Layout: One level Bathroom Shower/Tub: Multimedia programmer: Handicapped height Bathroom Accessibility: Yes Home Equipment: Shower seat,Grab bars - tub/shower Additional Comments: Lives in accessible townhome with 1 dog. Sister lives 20-min away and can assist, but also has 5 dogs   Functional History: Prior Function Level of Independence: Independent Comments: Independent, active, does not work, enjoys time with dog  Functional Status:  Mobility: Bed Mobility Overal bed mobility: Needs Assistance Bed Mobility: Rolling,Sidelying to Sit Rolling: Supervision Sidelying to sit: Min assist Supine to sit: Mod  assist General bed mobility comments: much improved BLE movement in bed, pt with heavy use of bed rail and minA for hip translation to EOB and trunk assist Transfers Overall transfer level: Needs assistance Equipment used: Rolling walker (2 wheeled) Transfers: Sit to/from Stand Sit to Stand: Mod assist,+2 safety/equipment Stand pivot transfers: Mod assist,+2 safety/equipment,+2 physical assistance  Lateral/Scoot Transfers: Min assist General transfer comment: x2 attempts to stand with pt returning to sit due to pain, able to stand on 2nd trial with RW and modA for trunk elevation; increased time and effort due to pain, cues for hand placement. After working on taking steps, pt unable to turn completely towards chair, requiring +16modA to pivot to recliner to complete  safe transfer; RN/pt notified they may need to try lateral scooting for return transfer (pt in drop arm chair) vs Stedy Ambulation/Gait Gait Distance (Feet): 1 Feet Assistive device: Rolling walker (2 wheeled) General Gait Details: pt unable to accept weight on LLE to take step with RLE, pt able to pivot only on RLE Gait velocity: Decreased  ADL: ADL Overall ADL's : Needs assistance/impaired Eating/Feeding: Independent,Sitting Grooming: Set up,Sitting Grooming Details (indicate cue type and reason): Setup to brush teeth seated in recliner Upper Body Bathing: Set up,Sitting Lower Body Bathing: Moderate assistance,Sitting/lateral leans,Sit to/from stand Upper Body Dressing : Set up,Sitting Lower Body Dressing: Maximal assistance,Sitting/lateral leans,Sit to/from stand Lower Body Dressing Details (indicate cue type and reason): Max A for socks, unable to bring feet to self or bend to feet Toilet Transfer Details (indicate cue type and reason): Simulated to recliner. Unable to pivot with walker. Min A for squat/scoot transfer to drop arm recliner. will need drop arm BSC Toileting- Clothing Manipulation and Hygiene:  Sitting/lateral lean,Sit to/from stand,Maximal assistance General ADL Comments: Pt limited by pain but able to progress further with premedication. Began instruction in compensatory strategies for ADLs/transfers to maximize independence   Cognition: Cognition Overall Cognitive Status: No family/caregiver present to determine baseline cognitive functioning Orientation Level: Oriented X4 Cognition Arousal/Alertness: Awake/alert Behavior During Therapy: WFL for tasks assessed/performed,Anxious Overall Cognitive Status: No family/caregiver present to determine baseline cognitive functioning Area of Impairment: Attention,Following commands,Problem solving Current Attention Level: Selective Following Commands: Follows multi-step commands inconsistently Problem Solving: Requires verbal cues General Comments: Suspect apparent cognitive impairment related to pt very distracted by pain requiring frequent cues for task and redirection, especially when attempting to take steps. Self-limiting by pain     Blood pressure 125/72, pulse 94, temperature 98.2 F (36.8 C), temperature source Oral, resp. rate 18, SpO2 98 %. Physical Exam  General: Alert and oriented x 3, No apparent distress HEENT: Head is normocephalic, atraumatic, PERRLA, EOMI, sclera anicteric, oral mucosa pink and moist, dentition intact, ext ear canals clear,  Neck: Supple without JVD or lymphadenopathy Heart: Reg rate and rhythm. No murmurs rubs or gallops Chest: CTA bilaterally without wheezes, rales, or rhonchi; no distress Abdomen: Soft, non-tender, non-distended, bowel sounds positive. Extremities: No clubbing, cyanosis, or edema. Pulses are 2+ Skin: Clean and intact without signs of breakdown Neuro: Exuberant --complaining about quality of food. She was able to answer basic orientation questions but could not recall name of hospital without visual cues. She is able to follow basic commands without difficulty.  5/5 strength  except for left lower extremity (pain limited). Psych: Pt's affect is appropriate. Pt is cooperative  Results for orders placed or performed during the hospital encounter of 07/30/20 (from the past 48 hour(s))  Basic metabolic panel     Status: Abnormal   Collection Time: 08/02/20  3:25 AM  Result Value Ref Range   Sodium 136 135 - 145 mmol/L   Potassium 3.7 3.5 - 5.1 mmol/L   Chloride 100 98 - 111 mmol/L   CO2 26 22 - 32 mmol/L   Glucose, Bld 115 (H) 70 - 99 mg/dL    Comment: Glucose reference range applies only to samples taken after fasting for at least 8 hours.   BUN 11 8 - 23 mg/dL   Creatinine, Ser 0.97 0.44 - 1.00 mg/dL   Calcium 9.0 8.9 - 10.3 mg/dL   GFR, Estimated >60 >60 mL/min    Comment: (NOTE) Calculated using the CKD-EPI Creatinine Equation (2021)    Anion gap  10 5 - 15    Comment: Performed at [facility], [place] [address], [place], [place] [address]  Magnesium     Status: None   Collection Time: 08/02/20  3:25 AM  Result Value Ref Range   Magnesium 1.7 1.7 - 2.4 mg/dL    Comment: Performed at [facility], [place] [address], [place], [place] [address]  Basic metabolic panel     Status: Abnormal   Collection Time: 08/03/20  3:52 AM  Result Value Ref Range   Sodium 138 135 - 145 mmol/L   Potassium 4.3 3.5 - 5.1 mmol/L   Chloride 100 98 - 111 mmol/L   CO2 28 22 - 32 mmol/L   Glucose, Bld 111 (H) 70 - 99 mg/dL    Comment: Glucose reference range applies only to samples taken after fasting for at least 8 hours.   BUN 12 8 - 23 mg/dL   Creatinine, Ser 0.88 0.44 - 1.00 mg/dL   Calcium 9.5 8.9 - 10.3 mg/dL   GFR, Estimated >60 >60 mL/min    Comment: (NOTE) Calculated using the CKD-EPI Creatinine Equation (2021)    Anion gap 10 5 - 15    Comment: Performed at [facility] [address] [place], [place] [address]  Magnesium     Status: None   Collection Time: 08/03/20  3:52 AM  Result Value Ref Range   Magnesium 2.0 1.7 - 2.4 mg/dL    Comment:  Performed at [facility] [address], [place], [place] [address]   No results found.     Medical Problem List and Plan: 1.  Impaired mobility and ADLs secondary to closed pelvic fracture.  -patient may shower  -ELOS/Goals: modI  2.  Antithrombotics: -DVT/anticoagulation:  Pharmaceutical: Lovenox--added  -antiplatelet therapy: N/A 3. Pain Management: Continue Oxycodone prn.  4. Mood: Team to order ego support. LCSW to follow for evaluation and support.   -antipsychotic agents: N/A 5. Neuropsych: This patient is capable of making decisions on her own behalf. 6. Skin/Wound Care: Routine pressure relief measure.  7. Fluids/Electrolytes/Nutrition: Monitor I/O. Check lytes in am.  8. HTN: Monitor BP tid--continue Zestril daily 9. Tachycardia: Monitor for symptoms with activity.  10. Anxiety d/o: Continue Celexa 12. Constipation: Has not had BM since admission--change Senna to scheduled. 13. Urinary incontinence: may use purewick if needed, nursing order placed.    Bary Leriche, PA-C  I have personally performed a face to face diagnostic evaluation, including, but not limited to relevant history and physical exam findings, of this patient and developed relevant assessment and plan.  Additionally, I have reviewed and concur with the physician assistant's documentation above.  Izora Ribas, MD 08/03/2020

## 2020-08-03 NOTE — Telephone Encounter (Signed)
Attempted TCM call-I spoke with patient & she says she is still in the hospital awaiting transfer to Inpatient Rehab.

## 2020-08-04 ENCOUNTER — Inpatient Hospital Stay (HOSPITAL_COMMUNITY): Payer: Medicare Other | Admitting: Physical Therapy

## 2020-08-04 ENCOUNTER — Inpatient Hospital Stay (HOSPITAL_COMMUNITY): Payer: Medicare Other | Admitting: Occupational Therapy

## 2020-08-04 ENCOUNTER — Inpatient Hospital Stay (HOSPITAL_COMMUNITY): Payer: Medicare Other

## 2020-08-04 LAB — CBC WITH DIFFERENTIAL/PLATELET
Abs Immature Granulocytes: 0.02 10*3/uL (ref 0.00–0.07)
Basophils Absolute: 0 10*3/uL (ref 0.0–0.1)
Basophils Relative: 1 %
Eosinophils Absolute: 0.2 10*3/uL (ref 0.0–0.5)
Eosinophils Relative: 3 %
HCT: 35 % — ABNORMAL LOW (ref 36.0–46.0)
Hemoglobin: 11.4 g/dL — ABNORMAL LOW (ref 12.0–15.0)
Immature Granulocytes: 0 %
Lymphocytes Relative: 12 %
Lymphs Abs: 0.7 10*3/uL (ref 0.7–4.0)
MCH: 31.1 pg (ref 26.0–34.0)
MCHC: 32.6 g/dL (ref 30.0–36.0)
MCV: 95.6 fL (ref 80.0–100.0)
Monocytes Absolute: 0.8 10*3/uL (ref 0.1–1.0)
Monocytes Relative: 13 %
Neutro Abs: 4.4 10*3/uL (ref 1.7–7.7)
Neutrophils Relative %: 71 %
Platelets: 175 10*3/uL (ref 150–400)
RBC: 3.66 MIL/uL — ABNORMAL LOW (ref 3.87–5.11)
RDW: 11.7 % (ref 11.5–15.5)
WBC: 6.1 10*3/uL (ref 4.0–10.5)
nRBC: 0 % (ref 0.0–0.2)

## 2020-08-04 LAB — COMPREHENSIVE METABOLIC PANEL
ALT: 19 U/L (ref 0–44)
AST: 25 U/L (ref 15–41)
Albumin: 3.1 g/dL — ABNORMAL LOW (ref 3.5–5.0)
Alkaline Phosphatase: 48 U/L (ref 38–126)
Anion gap: 11 (ref 5–15)
BUN: 11 mg/dL (ref 8–23)
CO2: 25 mmol/L (ref 22–32)
Calcium: 9.5 mg/dL (ref 8.9–10.3)
Chloride: 102 mmol/L (ref 98–111)
Creatinine, Ser: 0.95 mg/dL (ref 0.44–1.00)
GFR, Estimated: >60 mL/min (ref >60)
Glucose, Bld: 94 mg/dL (ref 70–99)
Potassium: 4.7 mmol/L (ref 3.5–5.1)
Sodium: 138 mmol/L (ref 135–145)
Total Bilirubin: 0.4 mg/dL (ref 0.3–1.2)
Total Protein: 6 g/dL — ABNORMAL LOW (ref 6.5–8.1)

## 2020-08-04 LAB — MAGNESIUM: Magnesium: 2 mg/dL (ref 1.7–2.4)

## 2020-08-04 MED ORDER — ROSUVASTATIN CALCIUM 20 MG PO TABS
40.0000 mg | ORAL_TABLET | Freq: Every day | ORAL | Status: DC
  Administered 2020-08-05 – 2020-08-14 (×10): 40 mg via ORAL
  Filled 2020-08-04 (×10): qty 2

## 2020-08-04 MED ORDER — ASPIRIN EC 81 MG PO TBEC
81.0000 mg | DELAYED_RELEASE_TABLET | Freq: Every day | ORAL | Status: DC
  Administered 2020-08-05 – 2020-08-14 (×10): 81 mg via ORAL
  Filled 2020-08-04 (×9): qty 1

## 2020-08-04 MED ORDER — LISINOPRIL 5 MG PO TABS
5.0000 mg | ORAL_TABLET | Freq: Every day | ORAL | Status: DC
  Administered 2020-08-05: 08:00:00 5 mg via ORAL
  Filled 2020-08-04: qty 1

## 2020-08-04 NOTE — Progress Notes (Signed)
Physical Therapy Session Note  Patient Details  Name: [name] [name] MRN: [id number] Date of Birth: 08/24/1951  Today's Date: 08/04/2020 PT Individual Time: 1415-1444 PT Individual Time Calculation (min): 29 min   Short Term Goals: Week 1:  PT Short Term Goal 1 (Week 1): STG=LTG due to ELOS  Skilled Therapeutic Interventions/Progress Updates:  Pt was seen bedside in the pm. Pt transferred supine to edge of bed with side rail and min A with verbal cues. Pt transferred sit to stand multiple times with rolling walker and min A. Pt ambulated 5 feet with rolling walker and min A with multiple cues and increased time. Pt left sitting up in w/c with all needs within reach.   Therapy Documentation Precautions:  Precautions Precautions: Fall Restrictions Weight Bearing Restrictions: Yes RLE Weight Bearing:  (no restrictions) LLE Weight Bearing: Weight bearing as tolerated General:   Pain: No c/o pain at rest, c/o pain with WB L LE.  Therapy/Group: Individual Therapy  Dub Amis 08/04/2020, 3:40 PM

## 2020-08-04 NOTE — Progress Notes (Signed)
[facility] PHYSICAL MEDICINE & REHABILITATION PROGRESS NOTE   Subjective/Complaints: No complaints this morning She had a very good night Practices exercises on her own and noting improvement Pain well controlled.   ROS: pain is well controlled.   Objective:   No results found. Recent Labs    08/04/20 0502  WBC 6.1  HGB 11.4*  HCT 35.0*  PLT 175   Recent Labs    08/03/20 0352 08/04/20 0502  NA 138 138  K 4.3 4.7  CL 100 102  CO2 28 25  GLUCOSE 111* 94  BUN 12 11  CREATININE 0.88 0.95  CALCIUM 9.5 9.5    Intake/Output Summary (Last 24 hours) at 08/04/2020 1550 Last data filed at 08/04/2020 1300 Gross per 24 hour  Intake 600 ml  Output 175 ml  Net 425 ml        Physical Exam: Vital Signs Blood pressure 102/65, pulse 98, temperature 98.2 F (36.8 C), temperature source Oral, resp. rate 14, SpO2 97 %. Gen: no distress, normal appearing HEENT: oral mucosa pink and moist, NCAT Cardio: Reg rate Chest: normal effort, normal rate of breathing Abd: soft, non-distended Ext: no edema Skin: intact Neuro: Exuberant --complaining about quality of food. She was able to answer basic orientation questions but could not recall name of hospital without visual cues. She is able to follow basic commands without difficulty.  5/5 strength except for left lower extremity (pain limited). Psych: Pt's affect is appropriate. Pt is cooperative  Assessment/Plan: 1. Functional deficits which require 3+ hours per day of interdisciplinary therapy in a comprehensive inpatient rehab setting.  Physiatrist is providing close team supervision and 24 hour management of active medical problems listed below.  Physiatrist and rehab team continue to assess barriers to discharge/monitor patient progress toward functional and medical goals  Care Tool:  Bathing  Bathing activity did not occur:  (Pt declined)           Bathing assist       Upper Body Dressing/Undressing Upper body  dressing   What is the patient wearing?: Pull over shirt    Upper body assist Assist Level: Set up assist    Lower Body Dressing/Undressing Lower body dressing      What is the patient wearing?: Pants     Lower body assist Assist for lower body dressing: Minimal Assistance - Patient > 75%     Toileting Toileting Toileting Activity did not occur (Clothing management and hygiene only): N/A (no void or bm)  Toileting assist       Transfers Chair/bed transfer  Transfers assist     Chair/bed transfer assist level: Minimal Assistance - Patient > 75%     Locomotion Ambulation   Ambulation assist   Ambulation activity did not occur: Safety/medical concerns  Assist level: Minimal Assistance - Patient > 75% Assistive device: Walker-rolling Max distance: 5   Walk 10 feet activity   Assist  Walk 10 feet activity did not occur: Safety/medical concerns        Walk 50 feet activity   Assist Walk 50 feet with 2 turns activity did not occur: Safety/medical concerns         Walk 150 feet activity   Assist Walk 150 feet activity did not occur: Safety/medical concerns         Walk 10 feet on uneven surface  activity   Assist Walk 10 feet on uneven surfaces activity did not occur: Safety/medical concerns         Wheelchair  Assist   Type of Wheelchair: Manual    Wheelchair assist level: Supervision/Verbal cueing Max wheelchair distance: 150    Wheelchair 50 feet with 2 turns activity    Assist            Wheelchair 150 feet activity     Assist      Assist Level: Supervision/Verbal cueing   Blood pressure 102/65, pulse 98, temperature 98.2 F (36.8 C), temperature source Oral, resp. rate 14, SpO2 97 %.    Medical Problem List and Plan: 1.  Impaired mobility and ADLs secondary to closed pelvic fracture.             -patient may shower             -ELOS/Goals: modI   -Continue CIR 2.   Antithrombotics: -DVT/anticoagulation:  Pharmaceutical: Lovenox--added             -antiplatelet therapy: N/A 3. Pain Management: Continue Tramadol, not requiring oxycodone, discuss discontinuing 4. Mood: Team to order ego support. LCSW to follow for evaluation and support.              -antipsychotic agents: N/A 5. Neuropsych: This patient is capable of making decisions on her own behalf. 6. Skin/Wound Care: Routine pressure relief measure.  7. Fluids/Electrolytes/Nutrition: Monitor I/O. Electrolytes stable 12/11: monitor weekly.   8. HTN: Monitor BP tid--continue Zestril daily  12/11: BP soft. Decrease lisinopril to 5mg .  9. Tachycardia: Monitor for symptoms with activity.  10. Anxiety d/o: Continue Celexa 12. Constipation: Has not had BM since admission--change Senna to scheduled. 13. Urinary incontinence: may use purewick if needed, nursing order placed.    LOS: 1 days A FACE TO FACE EVALUATION WAS PERFORMED  Martha Clan P Tamanika Heiney 08/04/2020, 3:50 PM

## 2020-08-04 NOTE — Evaluation (Signed)
Physical Therapy Assessment and Plan  Patient Details  Name: [name] [name] MRN: [id number] Date of Birth: Nov 27, 1950  PT Diagnosis: Difficulty walking, Muscle weakness and Pain in joint Rehab Potential: Excellent ELOS: 7-10 day   Today's Date: 08/04/2020 PT Individual Time: 0930-1030 PT Individual Time Calculation (min): 60 min    Hospital Problem: Principal Problem:   Closed pelvic fracture [facility])   Past Medical History:  Past Medical History:  Diagnosis Date  . Anemia    duering cancer treatment   . Anxiety   . Arthritis    OA hands   . Bilateral lower extremity pain   . Blood transfusion without reported diagnosis    during chemo for lung cancer  . Cataract    forming  but very small   . Cholelithiasis   . Colon polyps    adenomatous  . High cholesterol   . History of chemotherapy    for lung cancer- completed 2013  . Hx of radiation therapy 10/07/11 to 11/20/11   right lung  . Hx of radiation therapy 01/07/2012- 01/21/12   cranial irradiation  . Hypertension   . Lung cancer ([place]) 09/19/11   Stage III currently in remission  . Malignant melanoma of skin of canthus of right eye ([facility]) 02/2017  . Osteoporosis 04/2014   Dexa scan by Dr. Corinna Capra, T-score -2.7, next in 2 years, with at least 13 % major fracture in 10 years  . Pitting edema    Bilateral lower extremities, duplex ultrasound 04/03/2015 normal, no DVT   Past Surgical History:  Past Surgical History:  Procedure Laterality Date  . CHOLECYSTECTOMY  04/2011   biliary stent placement  . COLONOSCOPY    . Blanket  2015  . MOHS SURGERY Right 02/2017   cheek and eyelid  . Josph Macho Touch     Vaginal procedure  . POLYPECTOMY    . TUBAL LIGATION      Assessment & Plan Clinical Impression: Patient is a 69 year old female with history of HTN, stage III lung cancer s/p chemo/XRT, OA, anemia, anxiety d/o who was admitted on 07/30/20 after a fall off a stool due to LOB with onset of acute left  hip pain with difficulty ambulating. She was found to have acute left superior and inferior pubic rami Fx with small adjacent pelvic hematomas. Ortho recommended WBAT and to follow up with Dr. Percell Miller in 2-3 weeks. Hospital course significant for issues with difficulty WB LLE due to  pain, weakness, hypotension as well as tachycardia.  Patient transferred to CIR on 08/03/2020 .   Patient currently requires min with mobility secondary to muscle weakness and muscle joint tightness, decreased cardiorespiratoy endurance and decreased standing balance, decreased balance strategies and difficulty maintaining precautions.  Prior to hospitalization, patient was independent  with mobility and lived with Alone,Other (Comment) (discharge to sister's home) in a   home.  Home access is  Level entry.  Patient will benefit from skilled PT intervention to maximize safe functional mobility, minimize fall risk and decrease caregiver burden for planned discharge home with intermittent assist.  Anticipate patient will benefit from follow up [facility] at discharge.  PT - End of Session Activity Tolerance: Tolerates 10 - 20 min activity with multiple rests Endurance Deficit: Yes Endurance Deficit Description: partly limited by pain PT Assessment Rehab Potential (ACUTE/IP ONLY): Excellent PT Barriers to Discharge: Inaccessible home environment PT Patient demonstrates impairments in the following area(s): Balance;Endurance;Pain PT Transfers Functional Problem(s): Furniture;Car;Bed to Chair;Bed Mobility;Floor PT  Locomotion Functional Problem(s): Stairs;Wheelchair Mobility;Ambulation PT Plan PT Intensity: Minimum of 1-2 x/day ,45 to 90 minutes PT Frequency: 5 out of 7 days PT Duration Estimated Length of Stay: 7-10 day PT Treatment/Interventions: Ambulation/gait training;Discharge planning;Functional mobility training;Psychosocial support;Therapeutic Activities;Visual/perceptual remediation/compensation;Wheelchair  propulsion/positioning;Therapeutic Exercise;Skin care/wound management;Neuromuscular re-education;Disease management/prevention;Balance/vestibular training;Cognitive remediation/compensation;DME/adaptive equipment instruction;Pain management;Splinting/orthotics;UE/LE Strength taining/ROM;UE/LE Coordination activities;Stair training;Patient/family education;Community reintegration PT Transfers Anticipated Outcome(s): Mod I with LRAD PT Locomotion Anticipated Outcome(s): ambulaotry, Mod I with LRAD for house hold distance. PT Recommendation Recommendations for Other Services: Therapeutic Recreation consult Therapeutic Recreation Interventions: Outing/community reintergration;Kitchen group;Stress management Follow Up Recommendations: Home health PT Patient destination: Home Equipment Recommended: Rolling walker with 5" wheels Equipment Details: Pt has WC and RW   PT Evaluation Precautions/Restrictions Precautions Precautions: Fall Restrictions RLE Weight Bearing:  (no restrictions) LLE Weight Bearing: Weight bearing as tolerated General   Vital SignsTherapy Vitals Pulse Rate: 79 BP: 116/70 Pain Pain Assessment Pain Scale: 0-10 Pain Score: 4  Pain Type: Acute pain Pain Location: Hip Pain Orientation: Left Pain Descriptors / Indicators: Aching;Sore Pain Frequency: Constant Pain Onset: On-going Patients Stated Pain Goal: 3 Pain Intervention(s): Medication (See eMAR);Repositioned;Ambulation/increased activity Home Living/Prior Functioning Home Living Available Help at Discharge: Family;Available PRN/intermittently Home Access: Level entry Home Layout: One level Bathroom Shower/Tub: Multimedia programmer: Handicapped height Bathroom Accessibility: Yes Additional Comments: Lives in accessible townhome with 1 dog. Sister lives 20-min away and can assist, but also has 5 dogs  Lives With: Alone;Other (Comment) (discharge to sister's home) Prior Function Level of  Independence: Independent with basic ADLs;Independent with homemaking with ambulation  Able to Take Stairs?: Yes Driving: Yes Vocation: Retired Comments: Independent, active, does not work, enjoys time with Teacher, English as a foreign language: Within Functional Limits Praxis Praxis: Intact  Cognition Overall Cognitive Status: Within Functional Limits for tasks assessed Arousal/Alertness: Awake/alert Orientation Level: Oriented X4 Attention: Alternating Focused Attention: Appears intact Memory: Appears intact Immediate Memory Recall: Sock;Blue;Bed Memory Recall Sock: Without Cue Memory Recall Blue: Without Cue Memory Recall Bed: Without Cue Awareness: Appears intact Problem Solving: Appears intact Safety/Judgment: Appears intact Sensation Sensation Light Touch: Appears Intact Coordination Gross Motor Movements are Fluid and Coordinated: Yes Fine Motor Movements are Fluid and Coordinated: Yes Coordination and Movement Description: antalgic movements but no coordnation deficits noted Finger Nose Finger Test: [facility] Heel Shin Test: WFL in supine Motor  Motor Motor: Other (comment) Motor - Skilled Clinical Observations: motor movements limited by pain   Trunk/Postural Assessment  Cervical Assessment Cervical Assessment: Within Functional Limits Thoracic Assessment Thoracic Assessment: Within Functional Limits Lumbar Assessment Lumbar Assessment: Within Functional Limits Postural Control Postural Control: Within Functional Limits  Balance Balance Balance Assessed: Yes Static Sitting Balance Static Sitting - Level of Assistance: 6: Modified independent (Device/Increase time) Dynamic Sitting Balance Dynamic Sitting - Balance Support: Feet supported;During functional activity Dynamic Sitting - Level of Assistance: 6: Modified independent (Device/Increase time) Dynamic Sitting - Balance Activities: Lateral lean/weight shifting;Forward lean/weight shifting;Reaching  for Consulting civil engineer Standing - Balance Support: Bilateral upper extremity supported Static Standing - Level of Assistance: 4: Min assist;5: Stand by assistance Dynamic Standing Balance Dynamic Standing - Balance Support: During functional activity;Bilateral upper extremity supported Dynamic Standing - Level of Assistance: 4: Min assist;3: Mod assist Dynamic Standing - Balance Activities: Reaching for weighted objects Extremity Assessment  RUE Assessment RUE Assessment: Within Functional Limits General Strength Comments: 4+/5 LUE Assessment LUE Assessment: Within Functional Limits General Strength Comments: 4+/5 RLE Assessment RLE Assessment: Within Functional Limits General Strength Comments: grossly 5/5  LLE Assessment LLE Assessment: Exceptions to [facility] General Strength Comments: grossly 3+/5 with pain limiting in functional mobility  Care Tool Care Tool Bed Mobility Roll left and right activity   Roll left and right assist level: Minimal Assistance - Patient > 75%    Sit to lying activity   Sit to lying assist level: Minimal Assistance - Patient > 75%    Lying to sitting edge of bed activity   Lying to sitting edge of bed assist level: Minimal Assistance - Patient > 75%     Care Tool Transfers Sit to stand transfer   Sit to stand assist level: Minimal Assistance - Patient > 75%    Chair/bed transfer   Chair/bed transfer assist level: Minimal Assistance - Patient > 75%     Toilet transfer   Assist Level: Minimal Assistance - Patient > 75%    Car transfer Car transfer activity did not occur: Safety/medical concerns        Care Tool Locomotion Ambulation Ambulation activity did not occur: Safety/medical concerns        Walk 10 feet activity Walk 10 feet activity did not occur: Safety/medical concerns       Walk 50 feet with 2 turns activity Walk 50 feet with 2 turns activity did not occur: Safety/medical concerns      Walk 150 feet  activity Walk 150 feet activity did not occur: Safety/medical concerns      Walk 10 feet on uneven surfaces activity Walk 10 feet on uneven surfaces activity did not occur: Safety/medical concerns      Stairs Stair activity did not occur: Safety/medical concerns        Walk up/down 1 step activity Walk up/down 1 step or curb (drop down) activity did not occur: Safety/medical concerns     Walk up/down 4 steps activity did not occuR: Safety/medical concerns  Walk up/down 4 steps activity      Walk up/down 12 steps activity Walk up/down 12 steps activity did not occur: Safety/medical concerns      Pick up small objects from floor Pick up small object from the floor (from standing position) activity did not occur: Safety/medical concerns      Wheelchair   Type of Wheelchair: Manual   Wheelchair assist level: Supervision/Verbal cueing Max wheelchair distance: 150  Wheel 50 feet with 2 turns activity      Wheel 150 feet activity   Assist Level: Supervision/Verbal cueing    Refer to Care Plan for Long Term Goals  SHORT TERM GOAL WEEK 1 PT Short Term Goal 1 (Week 1): STG=LTG due to ELOS  Recommendations for other services: Therapeutic Recreation  Stress management and Outing/community reintegration  Skilled Therapeutic Intervention Mobility Bed Mobility Bed Mobility: Supine to Sit;Sit to Supine Supine to Sit: Minimal Assistance - Patient > 75% Sit to Supine: Minimal Assistance - Patient > 75% Transfers Transfers: Sit to Stand;Stand to Sit;Stand Pivot Transfers Sit to Stand: Minimal Assistance - Patient > 75% Stand to Sit: Minimal Assistance - Patient > 75% Stand Pivot Transfers: Minimal Assistance - Patient > 75% Stand Pivot Transfer Details: Manual facilitation for weight shifting;Tactile cues for sequencing;Visual cues/gestures for sequencing Stand Pivot Transfer Details (indicate cue type and reason): pt unable to perform lateral step with RLE due to pain in the L  hip. Transfer (Assistive device): Manufacturing systems engineer Ambulation: Yes Gait Assistance: Moderate Assistance - Patient 50-74% Gait Distance (Feet): 5 Feet Assistive device: Parallel bars Gait Gait: Yes Gait Pattern: Step-to pattern;Antalgic;Lateral  hip instability;Right hip hike;Left steppage Stairs / Additional Locomotion Stairs: No Architect: Yes Wheelchair Assistance: Chartered loss adjuster: Both upper extremities Wheelchair Parts Management: Needs assistance Distance: 150    Pt received supine in bed and agreeable to PT. Supine>sit transfer with min as listed above. PT instructed patient in PT Evaluation and initiated treatment intervention; see above for results. PT educated patient in [place], rehab potential, rehab goals, and discharge recommendations. WC mobility wiyh supervision assist with cues for improved turning technique. Gait training in parallel bars as listed with severe antalgia on the LLE limiting advancement of the RLE. Pt unable to perform car transfer due to pain. Pt returned to room and performed stand pivot transfer to bed with min assist and moderate cues to posteriorly rotate to allow stance on the LLE. Sit>supine completed with min assist for LLE. Supine therex instructed by PT: hip abduction x 6, clam shell AROM x 10, heel slide x 10, quad set x 10. All completed BLE with intermittent AAROM on the L. Pt left supine in bed with call bell in reach and all needs met.       Discharge Criteria: Patient will be discharged from PT if patient refuses treatment 3 consecutive times without medical reason, if treatment goals not met, if there is a change in medical status, if patient makes no progress towards goals or if patient is discharged from hospital.  The above assessment, treatment plan, treatment alternatives and goals were discussed and mutually agreed upon: by patient  Lorie Phenix 08/04/2020,  10:37 AM

## 2020-08-04 NOTE — Evaluation (Signed)
Occupational Therapy Assessment and Plan  Patient Details  Name: [name] [name] MRN: [id number] Date of Birth: [birth year]  OT Diagnosis: acute pain and muscle weakness (generalized) Rehab Potential: Rehab Potential (ACUTE ONLY): Good ELOS: 7-10 days   Today's Date: 08/04/2020 OT Individual Time: 0700-0800 OT Individual Time Calculation (min): 60 min     Hospital Problem: Principal Problem:   Closed pelvic fracture ([place])   Past Medical History:  Past Medical History:  Diagnosis Date  . Anemia    duering cancer treatment   . Anxiety   . Arthritis    OA hands   . Bilateral lower extremity pain   . Blood transfusion without reported diagnosis    during chemo for lung cancer  . Cataract    forming  but very small   . Cholelithiasis   . Colon polyps    adenomatous  . High cholesterol   . History of chemotherapy    for lung cancer- completed 2013  . Hx of radiation therapy 10/07/11 to 11/20/11   right lung  . Hx of radiation therapy 01/07/2012- 01/21/12   cranial irradiation  . Hypertension   . Lung cancer ([place]) 09/19/11   Stage III currently in remission  . Malignant melanoma of skin of canthus of right eye ([place]) 02/2017  . Osteoporosis 04/2014   Dexa scan by Dr. Corinna Capra, T-score -2.7, next in 2 years, with at least 13 % major fracture in 10 years  . Pitting edema    Bilateral lower extremities, duplex ultrasound 04/03/2015 normal, no DVT   Past Surgical History:  Past Surgical History:  Procedure Laterality Date  . CHOLECYSTECTOMY  04/2011   biliary stent placement  . COLONOSCOPY    . [place]  2015  . MOHS SURGERY Right 02/2017   cheek and eyelid  . Josph Macho Touch     Vaginal procedure  . POLYPECTOMY    . TUBAL LIGATION      Assessment & Plan Clinical Impression: [name] [name] is a 69 year old female with history of HTN, stage III lung cancer s/p chemo/XRT, OA, anemia, anxiety d/o who was admitted on 07/30/20 after a fall off a  stool due to LOB with onset of acute left hip pain with difficulty ambulating. She was found to have acute left superior and inferior pubic rami Fx with small adjacent pelvic hematomas. Ortho recommended WBAT and to follow up with Dr. Percell Miller in 2-3 weeks. Hospital course significant for issues with difficulty WB LLE due to  pain, weakness, hypotension as well as tachycardia.  Lisinopril was decreased to 10 mg daily and Celexa decreased due to age?Marland Kitchen  Admitted to CIR with impaired mobility and ADLs secondary to closed pelvic fracture. She currently complains of being unable to use purewick and feeling urge to urinate, and of pain at fracture site. Patient transferred to CIR on 08/03/2020 .    Patient currently requires min with basic self-care skills secondary to muscle weakness, decreased cardiorespiratoy endurance and decreased standing balance and decreased balance strategies.  Prior to hospitalization, patient could complete BADLs with independent .  Patient will benefit from skilled intervention to increase independence with basic self-care skills prior to discharge home with sister.  Anticipate patient will require intermittent supervision and no further OT follow recommended.  OT - End of Session Activity Tolerance: Decreased this session Endurance Deficit: Yes Endurance Deficit Description: partly limited by pain OT Assessment Rehab Potential (ACUTE ONLY): Good OT Patient demonstrates impairments in the following area(s):  Balance;Safety;Sensory;Edema;Endurance;Skin Integrity;Motor;Pain OT Basic ADL's Functional Problem(s): Grooming;Bathing;Dressing;Toileting OT Transfers Functional Problem(s): Toilet;Tub/Shower OT Additional Impairment(s): None OT Plan OT Intensity: Minimum of 1-2 x/day, 45 to 90 minutes OT Frequency: 5 out of 7 days OT Duration/Estimated Length of Stay: 7-10 days OT Treatment/Interventions: Balance/vestibular training;Disease mangement/prevention;Self Care/advanced ADL  retraining;Therapeutic Exercise;Wheelchair propulsion/positioning;Cognitive remediation/compensation;DME/adaptive equipment instruction;Pain management;Skin care/wound managment;UE/LE Strength taining/ROM;Community reintegration;Functional electrical stimulation;Patient/family education;UE/LE Coordination activities;Functional mobility training;Discharge planning;Psychosocial support;Therapeutic Activities;Visual/perceptual remediation/compensation;Splinting/orthotics OT Self Feeding Anticipated Outcome(s): n/a OT Basic Self-Care Anticipated Outcome(s): Mod I OT Toileting Anticipated Outcome(s): Mod I OT Bathroom Transfers Anticipated Outcome(s): Mod I OT Recommendation Patient destination: Home Follow Up Recommendations: None Equipment Recommended: None recommended by OT Equipment Details: per report, pt has shower chair and handicap accessible bathroom   OT Evaluation Precautions/Restrictions  Precautions Precautions: Fall Restrictions LLE Weight Bearing: Weight bearing as tolerated General   Vital Signs Therapy Vitals Temp: 98.2 F (36.8 C) Temp Source: Oral Pulse Rate: 92 Resp: 18 BP: 96/70 Patient Position (if appropriate): Lying Oxygen Therapy SpO2: 96 % O2 Device: Room Air Pain Pain Assessment Pain Scale: 0-10 Pain Score: 5  Pain Type: Acute pain Pain Location: Hip Pain Orientation: Left Pain Onset: With Activity Home Living/Prior Functioning Home Living Available Help at Discharge: Family,Available PRN/intermittently Home Access: Level entry Home Layout: One level Bathroom Shower/Tub: Multimedia programmer: Handicapped height Bathroom Accessibility: Yes  Lives With: Alone,Other (Comment) (discharge to sister's home) Prior Function Level of Independence: Independent with basic ADLs,Independent with homemaking with ambulation  Able to Take Stairs?: Yes Vision Baseline Vision/History: No visual deficits Patient Visual Report: No change from  baseline Vision Assessment?: No apparent visual deficits Perception  Perception: Within Functional Limits Praxis Praxis: Intact Cognition Overall Cognitive Status: Within Functional Limits for tasks assessed Arousal/Alertness: Awake/alert Orientation Level: Person;Place;Situation Person: Oriented Place: Oriented Situation: Oriented Year: 2021 Month: December Day of Week: Correct Memory: Appears intact Immediate Memory Recall: Sock;Blue;Bed Memory Recall Sock: Without Cue Memory Recall Blue: Without Cue Memory Recall Bed: Without Cue Attention: Alternating Focused Attention: Appears intact Awareness: Appears intact Problem Solving: Appears intact Safety/Judgment: Appears intact Sensation Sensation Light Touch: Appears Intact Coordination Gross Motor Movements are Fluid and Coordinated: No Fine Motor Movements are Fluid and Coordinated: Yes Coordination and Movement Description: Limited by pain Finger Nose Finger Test: [facility] Motor  Motor Motor: Other (comment) Motor - Skilled Clinical Observations: motor movements limited by pain  Trunk/Postural Assessment  Cervical Assessment Cervical Assessment: Within Functional Limits Thoracic Assessment Thoracic Assessment: Within Functional Limits Lumbar Assessment Lumbar Assessment: Within Functional Limits  Balance Balance Balance Assessed: Yes Dynamic Sitting Balance Dynamic Sitting - Balance Support: Feet supported;During functional activity Dynamic Sitting - Level of Assistance: 6: Modified independent (Device/Increase time) Dynamic Sitting - Balance Activities: Lateral lean/weight shifting;Forward lean/weight shifting;Reaching for Consulting civil engineer Standing - Balance Support: Bilateral upper extremity supported Static Standing - Level of Assistance: 5: Stand by assistance Dynamic Standing Balance Dynamic Standing - Balance Support: During functional activity Dynamic Standing - Level of Assistance:  4: Min assist Dynamic Standing - Balance Activities: Lateral lean/weight shifting Extremity/Trunk Assessment RUE Assessment RUE Assessment: Within Functional Limits General Strength Comments: 4+/5 LUE Assessment LUE Assessment: Within Functional Limits General Strength Comments: 4+/5  Care Tool Care Tool Self Care Eating   Eating Assist Level: Independent    Oral Care         Bathing Bathing activity did not occur:  (Pt declined)            Upper Body Dressing(including  orthotics)   What is the patient wearing?: Pull over shirt   Assist Level: Set up assist    Lower Body Dressing (excluding footwear)   What is the patient wearing?: Pants Assist for lower body dressing: Minimal Assistance - Patient > 75%    Putting on/Taking off footwear   What is the patient wearing?: [place] for footwear: Maximal Assistance - Patient 25 - 49%       Care Tool Toileting Toileting activity Toileting Activity did not occur (Clothing management and hygiene only): N/A (no void or bm)       Care Tool Bed Mobility Roll left and right activity        Sit to lying activity        Lying to sitting edge of bed activity   Lying to sitting edge of bed assist level: Minimal Assistance - Patient > 75%     Care Tool Transfers Sit to stand transfer   Sit to stand assist level: Minimal Assistance - Patient > 75%    Chair/bed transfer   Chair/bed transfer assist level: Minimal Assistance - Patient > 75%     Toilet transfer   Assist Level: Minimal Assistance - Patient > 75%     Care Tool Cognition Expression of Ideas and Wants Expression of Ideas and Wants: Without difficulty (complex and basic) - expresses complex messages without difficulty and with speech that is clear and easy to understand   Understanding Verbal and Non-Verbal Content Understanding Verbal and Non-Verbal Content: Understands (complex and basic) - clear comprehension without cues or repetitions    Memory/Recall Ability *first 3 days only Memory/Recall Ability *first 3 days only: Current season;Location of own room;That he or she is in a hospital/hospital unit    Refer to Care Plan for [place] 1 OT Short Term Goal 1 (Week 1): STGs=LTGs due to short ELOS  Recommendations for other services: None    Skilled Therapeutic Intervention OT evaluation completed. Discussed role of OT, ELOS, DME options, safety plan, rehab schedule, pt's goals, etc. Pt received supine in bed agreeable to session, however declined bathing due to getting washed up last night. Pt completed supine>sit EOB with min A and increased time d/t pain. Completed dressing sitting EOB with min-mod A LB dressing. Pt completed sit<>stand 3x from bed, progressing to bed at standard height and with min A. Educated on breathing techniques for pain management. Pt practiced stand pivot transfers bed<> w/c and w/c<>elevated toilet seat with min A. Pt able to independently move LLE during transfers, responding well to visual cues for placement. At end of session, pt left sitting in w/c to eat breakfast and with all needs in reach.   ADL   Mobility  Bed Mobility Bed Mobility: Supine to Sit Supine to Sit: Minimal Assistance - Patient > 75% Transfers Sit to Stand: Minimal Assistance - Patient > 75% Stand to Sit: Minimal Assistance - Patient > 75%   Discharge Criteria: Patient will be discharged from OT if patient refuses treatment 3 consecutive times without medical reason, if treatment goals not met, if there is a change in medical status, if patient makes no progress towards goals or if patient is discharged from hospital.  The above assessment, treatment plan, treatment alternatives and goals were discussed and mutually agreed upon: by patient  Duayne Cal 08/04/2020, 8:19 AM

## 2020-08-04 NOTE — Plan of Care (Signed)
  Problem: RH BOWEL ELIMINATION Goal: RH STG MANAGE BOWEL WITH ASSISTANCE Description: STG Manage Bowel with Assistance. Outcome: Not Progressing: LBM 12/6; MIRALAX  AT 5PM PER PT

## 2020-08-04 NOTE — Plan of Care (Signed)
  Problem: RH Balance Goal: LTG Patient will maintain dynamic standing balance (PT) Description: LTG:  Patient will maintain dynamic standing balance with assistance during mobility activities (PT) Flowsheets (Taken 08/04/2020 1029) LTG: Pt will maintain dynamic standing balance during mobility activities with:: Independent with assistive device    Problem: Sit to Stand Goal: LTG:  Patient will perform sit to stand with assistance level (PT) Description: LTG:  Patient will perform sit to stand with assistance level (PT) Flowsheets (Taken 08/04/2020 1029) LTG: PT will perform sit to stand in preparation for functional mobility with assistance level: Independent with assistive device   Problem: RH Bed Mobility Goal: LTG Patient will perform bed mobility with assist (PT) Description: LTG: Patient will perform bed mobility with assistance, with/without cues (PT). Flowsheets (Taken 08/04/2020 1029) LTG: Pt will perform bed mobility with assistance level of: Independent with assistive device    Problem: RH Bed to Chair Transfers Goal: LTG Patient will perform bed/chair transfers w/assist (PT) Description: LTG: Patient will perform bed to chair transfers with assistance (PT). Flowsheets (Taken 08/04/2020 1029) LTG: Pt will perform Bed to Chair Transfers with assistance level: Independent with assistive device    Problem: RH Car Transfers Goal: LTG Patient will perform car transfers with assist (PT) Description: LTG: Patient will perform car transfers with assistance (PT). Flowsheets (Taken 08/04/2020 1029) LTG: Pt will perform car transfers with assist:: Supervision/Verbal cueing   Problem: RH Ambulation Goal: LTG Patient will ambulate in controlled environment (PT) Description: LTG: Patient will ambulate in a controlled environment, # of feet with assistance (PT). Flowsheets (Taken 08/04/2020 1029) LTG: Pt will ambulate in controlled environ  assist needed:: Independent with assistive  device LTG: Ambulation distance in controlled environment: 54ft with LRAD Goal: LTG Patient will ambulate in home environment (PT) Description: LTG: Patient will ambulate in home environment, # of feet with assistance (PT). Flowsheets (Taken 08/04/2020 1029) LTG: Pt will ambulate in home environ  assist needed:: Independent with assistive device LTG: Ambulation distance in home environment: 29ft with LRAD   Problem: RH Wheelchair Mobility Goal: LTG Patient will propel w/c in controlled environment (PT) Description: LTG: Patient will propel wheelchair in controlled environment, # of feet with assist (PT) Flowsheets (Taken 08/04/2020 1029) LTG: Pt will propel w/c in controlled environ  assist needed:: Independent with assistive device LTG: Propel w/c distance in controlled environment: 154ft

## 2020-08-04 NOTE — Progress Notes (Signed)
Occupational Therapy Session Note  Patient Details  Name: [name] [name] MRN: [id number] Date of Birth: September 11, 1950  Today's Date: 08/04/2020 OT Individual Time: 1500-1541 OT Individual Time Calculation (min): 41 min    Short Term Goals: Week 1:  OT Short Term Goal 1 (Week 1): STGs=LTGs due to short ELOS  Skilled Therapeutic Interventions/Progress Updates:    Pt greeted in the w/c with no c/o pain, requesting for therapy in the room due to fatigue from earlier therapies. Therefore guided her through gentle UB stretches and then through UE exercise circuit using orange tband/3# bar x10 reps 2 sets each exercise to work on UB strengthening. At end of tx pt opted to remain sitting up in the w/c. Left her with all needs within reach.   Therapy Documentation Precautions:  Precautions Precautions: Fall Restrictions Weight Bearing Restrictions: Yes RLE Weight Bearing:  (no restrictions) LLE Weight Bearing: Weight bearing as tolerated Vital Signs: Therapy Vitals Pulse Rate: 98 Resp: 14 BP: 102/65 Patient Position (if appropriate): Lying Oxygen Therapy SpO2: 97 % O2 Device: Room Air Pain: Pain Assessment Pain Scale: 0-10 Pain Score: 2  Pain Type: Acute pain Pain Location: Hip Pain Orientation: Left Pain Descriptors / Indicators: Aching Pain Onset: On-going Pain Intervention(s): Medication (See eMAR) ADL:      Therapy/Group: Individual Therapy  [name] [name] A See Beharry 08/04/2020, 4:18 PM

## 2020-08-05 MED ORDER — INFLUENZA VAC A&B SA ADJ QUAD 0.5 ML IM PRSY
0.5000 mL | PREFILLED_SYRINGE | INTRAMUSCULAR | Status: AC
  Administered 2020-08-06: 10:00:00 0.5 mL via INTRAMUSCULAR
  Filled 2020-08-05: qty 0.5

## 2020-08-05 MED ORDER — LISINOPRIL 2.5 MG PO TABS
2.5000 mg | ORAL_TABLET | Freq: Every day | ORAL | Status: DC
  Administered 2020-08-06 – 2020-08-14 (×9): 2.5 mg via ORAL
  Filled 2020-08-05 (×9): qty 1

## 2020-08-05 NOTE — Progress Notes (Signed)
[facility] PHYSICAL MEDICINE & REHABILITATION PROGRESS NOTE   Subjective/Complaints: No complaints this morning Pain is well controlled Has been practicing exercises on her own.  Had a small BM  ROS: Denies pain Objective:   No results found. Recent Labs    08/04/20 0502  WBC 6.1  HGB 11.4*  HCT 35.0*  PLT 175   Recent Labs    08/03/20 0352 08/04/20 0502  NA 138 138  K 4.3 4.7  CL 100 102  CO2 28 25  GLUCOSE 111* 94  BUN 12 11  CREATININE 0.88 0.95  CALCIUM 9.5 9.5    Intake/Output Summary (Last 24 hours) at 08/05/2020 1441 Last data filed at 08/05/2020 1300 Gross per 24 hour  Intake 480 ml  Output --  Net 480 ml        Physical Exam: Vital Signs Blood pressure 99/67, pulse 95, temperature 99.2 F (37.3 C), temperature source Oral, resp. rate 16, SpO2 98 %. Gen: no distress, normal appearing HEENT: oral mucosa pink and moist, NCAT Cardio: Reg rate Chest: normal effort, normal rate of breathing Abd: soft, non-distended Ext: no edema Skin: intact Neuro: Exuberant --complaining about quality of food. She was able to answer basic orientation questions but could not recall name of hospital without visual cues. She is able to follow basic commands without difficulty.  5/5 strength except for left lower extremity (pain limited). Psych: Pt's affect is appropriate. Pt is cooperative  Assessment/Plan: 1. Functional deficits which require 3+ hours per day of interdisciplinary therapy in a comprehensive inpatient rehab setting.  Physiatrist is providing close team supervision and 24 hour management of active medical problems listed below.  Physiatrist and rehab team continue to assess barriers to discharge/monitor patient progress toward functional and medical goals  Care Tool:  Bathing  Bathing activity did not occur:  (Pt declined)           Bathing assist       Upper Body Dressing/Undressing Upper body dressing   What is the patient wearing?:  Pull over shirt    Upper body assist Assist Level: Set up assist    Lower Body Dressing/Undressing Lower body dressing      What is the patient wearing?: Pants,Underwear/pull up     Lower body assist Assist for lower body dressing: Minimal Assistance - Patient > 75%     Toileting Toileting Toileting Activity did not occur (Clothing management and hygiene only): N/A (no void or bm)  Toileting assist Assist for toileting: Maximal Assistance - Patient 25 - 49%     Transfers Chair/bed transfer  Transfers assist     Chair/bed transfer assist level: Minimal Assistance - Patient > 75%     Locomotion Ambulation   Ambulation assist   Ambulation activity did not occur: Safety/medical concerns  Assist level: Minimal Assistance - Patient > 75% Assistive device: Walker-rolling Max distance: 5   Walk 10 feet activity   Assist  Walk 10 feet activity did not occur: Safety/medical concerns        Walk 50 feet activity   Assist Walk 50 feet with 2 turns activity did not occur: Safety/medical concerns         Walk 150 feet activity   Assist Walk 150 feet activity did not occur: Safety/medical concerns         Walk 10 feet on uneven surface  activity   Assist Walk 10 feet on uneven surfaces activity did not occur: Safety/medical concerns  Wheelchair     Assist   Type of Wheelchair: Manual    Wheelchair assist level: Supervision/Verbal cueing Max wheelchair distance: 150    Wheelchair 50 feet with 2 turns activity    Assist            Wheelchair 150 feet activity     Assist      Assist Level: Supervision/Verbal cueing   Blood pressure 99/67, pulse 95, temperature 99.2 F (37.3 C), temperature source Oral, resp. rate 16, SpO2 98 %.    Medical Problem List and Plan: 1.  Impaired mobility and ADLs secondary to closed pelvic fracture.             -patient may shower             -ELOS/Goals: modI   -Continue  CIR 2.  Antithrombotics: -DVT/anticoagulation:  Pharmaceutical: Lovenox             -antiplatelet therapy: N/A 3. Pain Management: Continue Tramadol, not requiring oxycodone 4. Mood: Team to order ego support. LCSW to follow for evaluation and support.              -antipsychotic agents: N/A 5. Neuropsych: This patient is capable of making decisions on her own behalf. 6. Skin/Wound Care: Routine pressure relief measure.  7. Fluids/Electrolytes/Nutrition: Monitor I/O. Electrolytes stable 12/11: monitor weekly.   8. HTN: Monitor BP tid--continue Zestril daily  12/12: BP soft. Decrease lisinopril to 2.5mg .  9. Tachycardia: Monitor for symptoms with activity.   12/12: HR up to 95: continue to monitor TID 10. Anxiety d/o: Continue Celexa 12. Constipation: Has not had BM since admission--change Senna to scheduled.  12/12: had small BM: continue scheduled senna 13. Urinary incontinence: may use purewick if needed, nursing order placed.    LOS: 2 days A FACE TO FACE EVALUATION WAS PERFORMED  Clide Deutscher Sewell Pitner 08/05/2020, 2:41 PM

## 2020-08-05 NOTE — Progress Notes (Signed)
Patient has a peripheral IV right AC; requesting it be removed.

## 2020-08-06 ENCOUNTER — Inpatient Hospital Stay (HOSPITAL_COMMUNITY): Payer: Medicare Other | Admitting: Physical Therapy

## 2020-08-06 ENCOUNTER — Inpatient Hospital Stay (HOSPITAL_COMMUNITY): Payer: Medicare Other | Admitting: Occupational Therapy

## 2020-08-06 ENCOUNTER — Inpatient Hospital Stay (HOSPITAL_COMMUNITY): Payer: Medicare Other

## 2020-08-06 DIAGNOSIS — I10 Essential (primary) hypertension: Secondary | ICD-10-CM

## 2020-08-06 DIAGNOSIS — R32 Unspecified urinary incontinence: Secondary | ICD-10-CM

## 2020-08-06 DIAGNOSIS — R102 Pelvic and perineal pain: Secondary | ICD-10-CM

## 2020-08-06 DIAGNOSIS — D62 Acute posthemorrhagic anemia: Secondary | ICD-10-CM

## 2020-08-06 DIAGNOSIS — K5901 Slow transit constipation: Secondary | ICD-10-CM

## 2020-08-06 LAB — BASIC METABOLIC PANEL
Anion gap: 9 (ref 5–15)
BUN: 13 mg/dL (ref 8–23)
CO2: 27 mmol/L (ref 22–32)
Calcium: 9.5 mg/dL (ref 8.9–10.3)
Chloride: 103 mmol/L (ref 98–111)
Creatinine, Ser: 0.9 mg/dL (ref 0.44–1.00)
GFR, Estimated: >60 mL/min (ref >60)
Glucose, Bld: 106 mg/dL — ABNORMAL HIGH (ref 70–99)
Potassium: 4.9 mmol/L (ref 3.5–5.1)
Sodium: 139 mmol/L (ref 135–145)

## 2020-08-06 LAB — CBC
HCT: 34.1 % — ABNORMAL LOW (ref 36.0–46.0)
Hemoglobin: 10.9 g/dL — ABNORMAL LOW (ref 12.0–15.0)
MCH: 31.1 pg (ref 26.0–34.0)
MCHC: 32 g/dL (ref 30.0–36.0)
MCV: 97.2 fL (ref 80.0–100.0)
Platelets: 202 10*3/uL (ref 150–400)
RBC: 3.51 MIL/uL — ABNORMAL LOW (ref 3.87–5.11)
RDW: 11.6 % (ref 11.5–15.5)
WBC: 5.4 10*3/uL (ref 4.0–10.5)
nRBC: 0 % (ref 0.0–0.2)

## 2020-08-06 MED ORDER — POLYETHYLENE GLYCOL 3350 17 G PO PACK
17.0000 g | PACK | Freq: Two times a day (BID) | ORAL | Status: DC
  Administered 2020-08-07 – 2020-08-12 (×6): 17 g via ORAL
  Filled 2020-08-06 (×14): qty 1

## 2020-08-06 NOTE — Progress Notes (Signed)
Patient Details  Name: [name] [name] MRN: [id number] Date of Birth: 1950-12-10  Today's Date: 08/06/2020  Hospital Problems: Principal Problem:   Closed pelvic fracture [facility])  Past Medical History:  Past Medical History:  Diagnosis Date  . Anemia    duering cancer treatment   . Anxiety   . Arthritis    OA hands   . Bilateral lower extremity pain   . Blood transfusion without reported diagnosis    during chemo for lung cancer  . Cataract    forming  but very small   . Cholelithiasis   . Colon polyps    adenomatous  . High cholesterol   . History of chemotherapy    for lung cancer- completed 2013  . Hx of radiation therapy 10/07/11 to 11/20/11   right lung  . Hx of radiation therapy 01/07/2012- 01/21/12   cranial irradiation  . Hypertension   . Lung cancer ([place]) 09/19/11   Stage III currently in remission  . Malignant melanoma of skin of canthus of right eye ([place]) 02/2017  . Osteoporosis 04/2014   Dexa scan by Dr. Corinna Capra, T-score -2.7, next in 2 years, with at least 13 % major fracture in 10 years  . Pitting edema    Bilateral lower extremities, duplex ultrasound 04/03/2015 normal, no DVT   Past Surgical History:  Past Surgical History:  Procedure Laterality Date  . CHOLECYSTECTOMY  04/2011   biliary stent placement  . COLONOSCOPY    . [place]  2015  . MOHS SURGERY Right 02/2017   cheek and eyelid  . Josph Macho Touch     Vaginal procedure  . POLYPECTOMY    . TUBAL LIGATION     Social History:  reports that she quit smoking about 8 years ago. Her smoking use included cigarettes. She has never used smokeless tobacco. She reports current alcohol use. She reports that she does not use drugs.  Family / Support Systems Marital Status: Divorced Patient Roles: Other (Comment) (sibling) Other Supports: Heritage manager [phone] Anticipated Caregiver: Sister and brother in-law Ability/Limitations of Caregiver: Available to assist Caregiver  Availability: 24/7 (short time) Family Dynamics: Pt is close with her sister but they still fight and get on each other nerves. Pt reports: " She is bossy and thinks she is a Marine scientist."  Social History Preferred language: English Religion: Christian Cultural Background: No issues Education: HS Read: Yes Write: Yes Employment Status: Retired Public relations account executive Issues: No issues Guardian/Conservator: None-according to MD pt is capable of making her own decisions while here   Abuse/Neglect Abuse/Neglect Assessment Can Be Completed: Yes Physical Abuse: Denies Verbal Abuse: Denies Sexual Abuse: Denies Exploitation of patient/patient's resources: Denies Self-Neglect: Denies  Emotional Status Pt's affect, behavior and adjustment status: Pt is motivated to do well here and  make progress. She really wnat so be mod/i and not need assistance and be able to return back to her home soon. She will go to her sister's but hopes to be back at her home soon. Recent Psychosocial Issues: other health issues-being managed Psychiatric History: History of depression-anxiety takes medications for this and she feels they help. May benefit from seeing neuro-psych while here Substance Abuse History: No issues  Patient / Family Perceptions, Expectations & Goals Pt/Family understanding of illness & functional limitations: Pt is able to explain her pelvic frature she reports they did not do surgery and her main issue is her pain. But she is trying to push through and make progress while here. She  does talk with the MD and feels she is aware of her treatment plan going forward. Premorbid pt/family roles/activities: Sister, home owner, friend, Doctor, general practice, etc Anticipated changes in roles/activities/participation: resume Pt/family expectations/goals: Pt states: " I hope to be able to take care of myself before I leave here, I don not want to rely upon my sister."  [employer]:  Other (Comment) (Followed at Cancer center) Premorbid Home Care/DME Agencies: None Transportation available at discharge: Self, will be sister now until she is able to again Resource referrals recommended: Neuropsychology  Discharge Planning Living Arrangements: Alone Support Systems: Other relatives,Friends/neighbors Type of Residence: Private residence Insurance Resources: [employer] (specify) Personnel officer) Financial Resources: Radio broadcast assistant Screen Referred: No Living Expenses: Rent Money Management: Patient Does the patient have any problems obtaining your medications?: No Home Management: Self Patient/Family Preliminary Plans: Plans to go to sister's home in [place] until she is able to take care of herself and her dog. She hopes it will be sooner than later since she and sister at times get on one another nerves. Aware goals are mod/i with device Care Coordinator Anticipated Follow Up Needs: HH/OP  Clinical Impression Pleasant motivated female who is working on getting back to her independent level, even with the pain she is having. Aware will reach mod/i level, but still is going to her sister's home for a short time at discharge, sister is also caring for her dog. Will work on discharge needs.   Elease Hashimoto 08/06/2020, 10:25 AM

## 2020-08-06 NOTE — Progress Notes (Signed)
Physical Therapy Session Note  Patient Details  Name: [name] [name] MRN: [id number] Date of Birth: [birth year]  Today's Date: 08/06/2020 PT Individual Time: 1500-1530 PT Individual Time Calculation (min): 30 min   Short Term Goals: Week 1:  PT Short Term Goal 1 (Week 1): STG=LTG due to ELOS  Skilled Therapeutic Interventions/Progress Updates:    Pt greeted seated in w/c with sister at bedside, pt agreeable to therapy session. She reports no resting pain however does report pain (unrated) with weight bearing while standing. Transported with totalA in w/c for energy conservation to main therapy gym. Performed stand<>pivot from w/c to mat table with CGA/minA and RW. Performed repeated sit>stands from lowered mat table height with CGA and RW. Noted decreased L lateral weight shift with stands and grimacing with attempts to improve this. Attempted to perform lateral side stepping along mat table with RW support however unable to tolerate adequate weightbearing through LLE to actually lift her RLE. The same was true when attempting to perform static standing marching. Due to time constraints, performed stand>pivot with CGA and RW from mat table to w/c, scooting her R foot rather than lifting it due to L pain with weight bearing. She was returned to her room with totalA in w/c and performed additional stand>pivot with minA and no AD from w/c to EOB. She required minA for LLE management to return to supine from sitting position. Ended session supine in bed with needs in reach, bed alarm on, daughter at bedside, and NT present for routine medications.   Therapy Documentation Precautions:  Precautions Precautions: Fall Restrictions Weight Bearing Restrictions: Yes RLE Weight Bearing: Weight bearing as tolerated LLE Weight Bearing: Weight bearing as tolerated  Therapy/Group: Individual Therapy  Sundy Houchins P Prabhjot Maddux PT 08/06/2020, 3:34 PM

## 2020-08-06 NOTE — Progress Notes (Signed)
[facility] Individual Statement of Services  Patient Name:  [name] [name]  Date:  08/06/2020  Welcome to the [facility].  Our goal is to provide you with an individualized program based on your diagnosis and situation, designed to meet your specific needs.  With this comprehensive rehabilitation program, you will be expected to participate in at least 3 hours of rehabilitation therapies Monday-Friday, with modified therapy programming on the weekends.  Your rehabilitation program will include the following services:  Physical Therapy (PT), Occupational Therapy (OT), 24 hour per day rehabilitation nursing, Neuropsychology, Care Coordinator, Rehabilitation Medicine, Nutrition Services and Pharmacy Services  Weekly team conferences will be held on Wednesday to discuss your progress.  Your Inpatient Rehabilitation Care Coordinator will talk with you frequently to get your input and to update you on team discussions.  Team conferences with you and your family in attendance may also be held.  Expected length of stay: 7-10 days  Overall anticipated outcome: independent with device  Depending on your progress and recovery, your program may change. Your Inpatient Rehabilitation Care Coordinator will coordinate services and will keep you informed of any changes. Your Inpatient Rehabilitation Care Coordinator's name and contact numbers are listed  below.  The following services may also be recommended but are not provided by the [facility] will be made to provide these services after discharge if needed.  Arrangements include referral to agencies that provide these services.  Your insurance has been verified to be:  [place]  Your primary doctor is:  Kathlene November  Pertinent information will be shared with your  doctor and your insurance company.  Inpatient Rehabilitation Care Coordinator:  Ovidio Kin, [place] or Emilia Beck  Information discussed with and copy given to patient by: Elease Hashimoto, 08/06/2020, 10:15 AM

## 2020-08-06 NOTE — Progress Notes (Addendum)
[facility] PHYSICAL MEDICINE & REHABILITATION PROGRESS NOTE   Subjective/Complaints: Patient seen laying in bed this morning.  She states she slept well overnight.  She states had a good weekend.  She has constipation.  ROS: Denies CP, SOB, N/V/D  Objective:   No results found. Recent Labs    08/04/20 0502 08/06/20 0534  WBC 6.1 5.4  HGB 11.4* 10.9*  HCT 35.0* 34.1*  PLT 175 202   Recent Labs    08/04/20 0502 08/06/20 0534  NA 138 139  K 4.7 4.9  CL 102 103  CO2 25 27  GLUCOSE 94 106*  BUN 11 13  CREATININE 0.95 0.90  CALCIUM 9.5 9.5    Intake/Output Summary (Last 24 hours) at 08/06/2020 1637 Last data filed at 08/06/2020 0841 Gross per 24 hour  Intake 480 ml  Output --  Net 480 ml        Physical Exam: Vital Signs Blood pressure 130/77, pulse 92, temperature 98.9 F (37.2 C), temperature source Oral, resp. rate 14, SpO2 98 %. Constitutional: No distress . Vital signs reviewed. HENT: Normocephalic.  Atraumatic. Eyes: EOMI. No discharge. Cardiovascular: No JVD.  RRR. Respiratory: Normal effort.  No stridor.  Bilateral clear to auscultation. GI: Non-distended.  BS +. Skin: Warm and dry.  Intact. Psych: Normal mood.  Normal behavior. Musc: Left hip with edema and tenderness. Neuro: Alert Motor: Bilateral upper extremities, right lower extremity: 5/5 proximal distant and left lower extremity: Hip flexion, knee extension 3 -/5 (pain in which), ankle dorsiflexion 4/5  Assessment/Plan: 1. Functional deficits which require 3+ hours per day of interdisciplinary therapy in a comprehensive inpatient rehab setting.  Physiatrist is providing close team supervision and 24 hour management of active medical problems listed below.  Physiatrist and rehab team continue to assess barriers to discharge/monitor patient progress toward functional and medical goals  Care Tool:  Bathing  Bathing activity did not occur: Refused (Pt declined) Body parts bathed by patient:  Right arm,Left arm,Chest,Abdomen,Front perineal area,Buttocks,Right upper leg,Left upper leg,Right lower leg,Face   Body parts bathed by helper: Left lower leg     Bathing assist Assist Level: Minimal Assistance - Patient > 75%     Upper Body Dressing/Undressing Upper body dressing   What is the patient wearing?: Pull over shirt    Upper body assist Assist Level: Set up assist    Lower Body Dressing/Undressing Lower body dressing      What is the patient wearing?: Pants,Underwear/pull up     Lower body assist Assist for lower body dressing: Minimal Assistance - Patient > 75%     Toileting Toileting Toileting Activity did not occur (Clothing management and hygiene only): N/A (no void or bm)  Toileting assist Assist for toileting: Maximal Assistance - Patient 25 - 49%     Transfers Chair/bed transfer  Transfers assist     Chair/bed transfer assist level: Minimal Assistance - Patient > 75%     Locomotion Ambulation   Ambulation assist   Ambulation activity did not occur: Safety/medical concerns  Assist level: Minimal Assistance - Patient > 75% Assistive device: Walker-rolling Max distance: 5   Walk 10 feet activity   Assist  Walk 10 feet activity did not occur: Safety/medical concerns        Walk 50 feet activity   Assist Walk 50 feet with 2 turns activity did not occur: Safety/medical concerns         Walk 150 feet activity   Assist Walk 150 feet activity did not  occur: Safety/medical concerns         Walk 10 feet on uneven surface  activity   Assist Walk 10 feet on uneven surfaces activity did not occur: Safety/medical concerns         Wheelchair     Assist Will patient use wheelchair at discharge?: Yes Type of Wheelchair: Manual    Wheelchair assist level: Supervision/Verbal cueing Max wheelchair distance: 150    Wheelchair 50 feet with 2 turns activity    Assist        Assist Level: Supervision/Verbal  cueing   Wheelchair 150 feet activity     Assist      Assist Level: Supervision/Verbal cueing     Medical Problem List and Plan: 1.  Impaired mobility and ADLs secondary to closed pelvic fracture.  Continue CIR 2.  Antithrombotics: -DVT/anticoagulation:  Pharmaceutical: Lovenox             -antiplatelet therapy: N/A 3. Pain Management: Continue Tramadol, oxycodone as needed  Relatively controlled with meds on 12/13. 4. Mood: Team to order ego support. LCSW to follow for evaluation and support.              -antipsychotic agents: N/A 5. Neuropsych: This patient is capable of making decisions on her own behalf. 6. Skin/Wound Care: Routine pressure relief measure.  7. Fluids/Electrolytes/Nutrition: Monitor I/Os.  8. HTN: Monitor BP -   Decrease lisinopril to 2.5mg .   Controlled with meds on 12/13 9. Tachycardia: Monitor for symptoms with activity.   Controlled on 12/13 10. Anxiety: Continue Celexa 12. Slow transit constipation:   Changed senna to scheduled.  Bowel meds increased again on 12/13 13. Urinary incontinence:   ?  Improving 14.  Acute blood loss anemia  Hemoglobin 10.9 on 12/13  Continue to monitor  LOS: 3 days A FACE TO FACE EVALUATION WAS PERFORMED  Vernia Teem Lorie Phenix 08/06/2020, 4:37 PM

## 2020-08-06 NOTE — Progress Notes (Signed)
Physical Therapy Session Note  Patient Details  Name: [name] [name] MRN: [id number] Date of Birth: 1951-02-21  Today's Date: 08/06/2020 PT Individual Time: 0900-1000 PT Individual Time Calculation (min): 60 min   Short Term Goals: Week 1:  PT Short Term Goal 1 (Week 1): STG=LTG due to ELOS  Skilled Therapeutic Interventions/Progress Updates: Pt presented in bed agreeable to therapy. Pt c/o pain in L hip (unrated) but states received pain meds prior to session with rest breaks provided as needed. Pt demonstrated how she has been working on ROM in LLE and able to perform AROM hip flexion, abd/add and heel slides with minimal increase in pain. Pt also demonstrated rolling L/R with use of bed feautres although admits does has significantly increased pain in L hip when rolling to L. Pt performed supine to sit with modA via log roll technique with PTA provided assistance for BLE management and truncal support. Pt then performed stand pivot with RW and modA with pt  Unable to tolerate wt bearing on LLE thus shifting RLE on floor to turn. Pt transported to rehab gym for energy conservation and performed stand pivot transfer to mat with minA and improved tolerance to wt bearing with PTA providing verbal cues to shorten step length and push through with BUE when advancing RLE to decrease load on LLE. Pt then participated in ankle pumps, LAQ, and hip flexion  2 x 15 bilaterally. PTA also providing education and illustration of pt's fracture as well as providing education on appropriate strain required for healing. Pt appreciative of input and better understanding of why R pelvis painful with some movement when fracture on L. Pt then ambulated ~73ft with RW and minA overall with w/c follow. Pt then transported to day room and participated in Cybex Kinetron 80cm/sec to fatigue (approx 3 min) WLP for L hip ROM and strengthening. Pt transported back to room at end of session and agreeable to remain in w/c  ~1hour until next session. Pt left with belt alarm on, call bell within reach and needs met.      Therapy Documentation Precautions:  Precautions Precautions: Fall Restrictions Weight Bearing Restrictions: Yes RLE Weight Bearing: Weight bearing as tolerated LLE Weight Bearing: Weight bearing as tolerated General:   Vital Signs: Therapy Vitals Temp: 98.9 F (37.2 C) Temp Source: Oral Pulse Rate: 92 Resp: 14 BP: 130/77 Patient Position (if appropriate): Lying Oxygen Therapy SpO2: 98 % O2 Device: Room Air Pain: Pain Assessment Pain Scale: 0-10 Pain Score: 5    Therapy/Group: Individual Therapy  Waunita Sandstrom  Markesia Crilly, PTA  08/06/2020, 4:23 PM

## 2020-08-06 NOTE — Progress Notes (Signed)
Inpatient Rehabilitation  Patient information reviewed and entered into eRehab system by Karlea Mckibbin M. Shyniece Scripter, M.A., CCC/SLP, PPS Coordinator.  Information including medical coding, functional ability and quality indicators will be reviewed and updated through discharge.    

## 2020-08-06 NOTE — IPOC Note (Signed)
Individualized overall Plan of Care [facility]) Patient Details Name: [name] [name] MRN: [id number] DOB: Dec 13, 1950  Admitting Diagnosis: Closed pelvic fracture [facility])  Hospital Problems: Principal Problem:   Closed pelvic fracture ([place]) Active Problems:   Acute blood loss anemia   Urinary incontinence   Slow transit constipation   Pelvic pain     Functional Problem List: Nursing Pain,Skin Integrity,Bladder,Bowel,Endurance,Medication Management  PT Balance,Endurance,Pain  OT Balance,Safety,Sensory,Edema,Endurance,Skin Integrity,Motor,Pain  SLP    TR         Basic ADL's: OT Grooming,Bathing,Dressing,Toileting     Advanced  ADL's: OT       Transfers: PT Furniture,Car,Bed to Chair,Bed Mobility,Floor  OT Toilet,Tub/Shower     Locomotion: PT Stairs,Wheelchair Mobility,Ambulation     Additional Impairments: OT None  SLP        TR      Anticipated Outcomes Item Anticipated Outcome  Self Feeding n/a  Swallowing      Basic self-care  Mod I  Toileting  Mod I   Bathroom Transfers Mod I  Bowel/Bladder  Pt will manage bowel and bladder with min assist  Transfers  Mod I with LRAD  Locomotion  ambulaotry, Mod I with LRAD for house hold distance.  Communication     Cognition     Pain  Pt will manage pain 3 or less on a scale of 0-10  Safety/Judgment  Pt wil remain free of falls and injury while in rehab   Therapy Plan: PT Intensity: Minimum of 1-2 x/day ,45 to 90 minutes PT Frequency: 5 out of 7 days PT Duration Estimated Length of Stay: 7-10 day OT Intensity: Minimum of 1-2 x/day, 45 to 90 minutes OT Frequency: 5 out of 7 days OT Duration/Estimated Length of Stay: 7-10 days      Team Interventions: Nursing Interventions Patient/Family Education,Pain Management,Medication Management,Skin Care/Wound [place]  PT interventions Ambulation/gait training,Discharge planning,Functional mobility  training,Psychosocial support,Therapeutic Activities,Visual/perceptual remediation/compensation,Wheelchair propulsion/positioning,Therapeutic Exercise,Skin care/wound management,Neuromuscular re-education,Disease Environmental consultant training,Cognitive remediation/compensation,DME/adaptive equipment instruction,Pain management,Splinting/orthotics,UE/LE Strength taining/ROM,UE/LE Coordination activities,Stair training,Patient/family education,Community reintegration  OT Interventions Balance/vestibular training,Disease mangement/prevention,Self Care/advanced ADL retraining,Therapeutic Exercise,Wheelchair propulsion/positioning,Cognitive remediation/compensation,DME/adaptive equipment instruction,Pain management,Skin care/wound managment,UE/LE Strength taining/ROM,Community reintegration,Functional electrical stimulation,Patient/family education,UE/LE Coordination activities,Functional mobility training,Discharge planning,Psychosocial support,Therapeutic Activities,Visual/perceptual remediation/compensation,Splinting/orthotics  SLP Interventions    TR Interventions    SW/CM Interventions Discharge Planning,Psychosocial Support,Patient/Family Education   Barriers to Discharge MD  Medical stability  Nursing      PT Inaccessible home environment    OT      SLP      SW       Team Discharge Planning: Destination: PT-Home ,OT- Home , SLP-  Projected Follow-up: PT-Home health PT, OT-  None, SLP-  Projected Equipment Needs: PT-Rolling walker with 5" wheels, OT- None recommended by OT, SLP-  Equipment Details: PT-Pt has WC and RW, OT-per report, pt has shower chair and handicap accessible bathroom Patient/family involved in discharge planning: PT- Patient,  OT-Patient, SLP-   MD ELOS: 5-9 days. Medical Rehab Prognosis:  Excellent Assessment: 69 year old female with history of HTN, stage III lung cancer s/p chemo/XRT, OA, anemia, anxiety d/o who was admitted on 07/30/20 after a  fall off a stool due to LOB with onset of acute left hip pain with difficulty ambulating. She was found to have acute left superior and inferior pubic rami Fx with small adjacent pelvic hematomas. Ortho recommended WBAT and to follow up with Dr. Percell Miller in 2-3 weeks. Hospital course significant for issues with difficulty WB LLE due to  pain, weakness,  hypotension as well as tachycardia.  Lisinopril was decreased due to soft blood pressures and Celexa decreased due to age?.  Patient with resulting functional deficits with mobility, transfers, endurance, self-care.  We will set goals for Mod I with PT/OT.    Due to the current state of emergency, patients may not be receiving their 3-hours of Medicare-mandated therapy.  See Team Conference Notes for weekly updates to the plan of care

## 2020-08-06 NOTE — Progress Notes (Signed)
Occupational Therapy Session Note  Patient Details  Name: [name] [name] MRN: [id number] Date of Birth: 11-06-1950  Today's Date: 08/06/2020 OT Individual Time: 1330-1430 OT Individual Time Calculation (min): 60 min    Short Term Goals: Week 1:  OT Short Term Goal 1 (Week 1): STGs=LTGs due to short ELOS  Skilled Therapeutic Interventions/Progress Updates:    Pt sitting up in w/c with sister in room for encouragement throughout session.  Pt reporting 5/10 pain in left hip more so in standing.  Nurse made aware and medication administered during session by nursing for pain management.  Pt participated in compensatory strategy training during LB dressing including use of sock aide and sequencing of threading/unthreading BLE.  Pt required multiple trials for improved carryover of strategies.  Pt doffed/donned pants x 3 trials noting with faded VCs needing CGA only with pt able to bring lower extremities up to w/c seat as needed.  Pt also required consistent VCs for safe hand placement during sit<>stand using RW and CGA for balance due to pain limiting ability to fully weight bear through LLE. Pt exhibiting difficulty reaching to donn socks therefore educated on use of sock aide.  Pt able to return demo with supervision needing only occasional VCs for problem solving.  Pt then completed stand pivot w/c<>3 in 1 commode with min assist due to pain level.  Pt required height adjustment of 3 in 1 commode lowered for increased independence and comfort.  Pt in w/c, call bell in reach, seat belt alarm on.   Therapy Documentation Precautions:  Precautions Precautions: Fall Restrictions Weight Bearing Restrictions: Yes RLE Weight Bearing: Weight bearing as tolerated LLE Weight Bearing: Weight bearing as tolerated   Therapy/Group: Individual Therapy  Ezekiel Slocumb 08/06/2020, 4:30 PM

## 2020-08-06 NOTE — Progress Notes (Addendum)
Occupational Therapy Session Note  Patient Details  Name: [name] [name] MRN: [id number] Date of Birth: 06/20/1951  Today's Date: 08/06/2020 OT Individual Time: 1105-1200 OT Individual Time Calculation (min): 55 min    Short Term Goals: Week 1:  OT Short Term Goal 1 (Week 1): STGs=LTGs due to short ELOS  Skilled Therapeutic Interventions/Progress Updates:    Pt received in w/c and discussed the plan for a shower today. Pt initially hesitant but once I showed her the tub bench and where she would sit, she was agreeable to trying it.  Transported pt via wc to bathroom and she used grab bar by toilet for support to stand to doff pants over hips, sat back down and then used bars in shower for support to transfer into shower.  Pt initially needing min A to stand but once in shower she stood 2 x with S when she needed to stand to wash her bottom.  Pt urinated in shower and then washed off again.  (supervision with shower except for L foot - min A). CGA to transfer out of shower.  Dressed from wc with A to start pants over L leg. She then stood with bar again and pulled her pants over her hips.   Pt participated very well despite being sore and tired.  She stated she gained some confidence this session. Pt resting in wc with belt alarm on and all needs met.   Therapy Documentation Precautions:  Precautions Precautions: Fall Restrictions Weight Bearing Restrictions: Yes RLE Weight Bearing: Weight bearing as tolerated LLE Weight Bearing: Weight bearing as tolerated   Pain: Pain Assessment Pain Scale: 0-10 Pain Score: 5  - Left foot/heel  Pain Intervention(s): pre Medicated     Therapy/Group: Individual Therapy  Prentiss 08/06/2020, 10:37 AM

## 2020-08-07 ENCOUNTER — Encounter: Payer: Medicare Other | Admitting: Internal Medicine

## 2020-08-07 ENCOUNTER — Inpatient Hospital Stay (HOSPITAL_COMMUNITY): Payer: Medicare Other

## 2020-08-07 ENCOUNTER — Inpatient Hospital Stay (HOSPITAL_COMMUNITY): Payer: Medicare Other | Admitting: Physical Therapy

## 2020-08-07 ENCOUNTER — Inpatient Hospital Stay (HOSPITAL_COMMUNITY): Payer: Medicare Other | Admitting: Occupational Therapy

## 2020-08-07 NOTE — Progress Notes (Signed)
Physical Therapy Session Note  Patient Details  Name: [name] [name] MRN: [id number] Date of Birth: 1951/08/13  Today's Date: 08/07/2020 PT Individual Time: 1000-1100 + 1430-1500 PT Individual Time Calculation (min): 60 min  + 30 min  Short Term Goals: Week 1:  PT Short Term Goal 1 (Week 1): STG=LTG due to ELOS  Skilled Therapeutic Interventions/Progress Updates:     1st session: Pt received supine in bed, agreeable to PT session. Reports no resting pain but does report recent pain medication from nursing staff. She performed supine<>sit with supervision with use of bed features including HOB elevated and bed rail. Performed stand>pivot transfer with CGA and RW from EOB to w/c. She was transported for energy conservation purposes to ortho gym. Performed car transfer with CGA and RW with car set height set to 26inches, demonstrated ability to manage BLE"s into and out of the car. Performed additional stand<>pivot CGA and RW to mat table. Performed interval gait training, able to ambulate 2x92ft + 35ft (seated rest breaks) with minA and RW. During this, provided a metronome set at 25-30bpm to encourage stepping pattern and rhythmic cadence. Gait deficits include decreased R weight shift, decreased R foot clearance, and antalgic with heavy reliance of BUE's through RW. Performed Nustep with workload set to 2 for x10 minutes, encouraging self AAROM for lower extremities as well as modified weight bearing, tolerated well without reports of pain. Performed stand<>pivot with CGA and RW back to her w/c and she was returned to her room. Additional stand>pivot with CGA and no AD, using bed rail, from w/c to EOB. Able to complete sit>supine with CGA although effortful for LLE management. She remained semi-reclined in bed at end of session with bed alarm on and needs in reach.  2nd session: Pt received seated in w/c, agreeable to PT session, no reports of pain. Transported in w/c for time management  to main therapy gym. Performed stand<>pivot with CGA and RW from w/c to mat table. Performed 2x10 standing toe taps with RW support and CGA on 1inch platform, encouraging L weight bearing and stepping patterns. She required seated rest breaks to recover from this due to pain but was agreeable and cooperative throughout session. She then ambulated ~25ft with CGA/minA and RW, cues for stepping pattern, cadence, and deep breathing. Continues to show quite an antalgic gait pattern with limited L weight shift and requires cues for LIFTING her R foot rather than SCOOTING it. Pt then propelled herself ~188ft with supervision in her w/c on level surfaces, show's decreased efficiency with stroke propulsion. Returned to her room where she performed an additional stand<>pivot with CGA and no AD (using bed rail) from w/c to EOB and able to perform sit?supine with supervision. She was also able to scoot herself up in the bed with [facility] Flat using bridging technique. Ended session semi-reclined in bed with needs in reach and bed alarm on, sister at bedside.  Therapy Documentation Precautions:  Precautions Precautions: Fall Restrictions Weight Bearing Restrictions: Yes RLE Weight Bearing: Weight bearing as tolerated LLE Weight Bearing: Weight bearing as tolerated  Therapy/Group: Individual Therapy  Yvonnie Schinke P Evelia Waskey PT 08/07/2020, 7:39 AM

## 2020-08-07 NOTE — Progress Notes (Signed)
Occupational Therapy Session Note  Patient Details  Name: [name] [name] MRN: [id number] Date of Birth: 06/11/1951  Today's Date: 08/07/2020 OT Individual Time: 1300-1400 OT Individual Time Calculation (min): 60 min    Short Term Goals: Week 1:  OT Short Term Goal 1 (Week 1): STGs=LTGs due to short ELOS  Skilled Therapeutic Interventions/Progress Updates:    Pt supine in bed, agreeable to OT session, reporting 5/10 pain requesting pain medication.  Nurse made aware.  Pt completed supine to sit with supervision.  Stand pivot to w/c using RW with CGA and increased time due to pain and stiffness with difficulty weightbearing through LLE.  Pt transported to bathroom and completed stand pivot transfer to cushioned 3 in 1 commode over toilet with CGA using grab bars.  Pt had continent episode of urine and completed toileting with CGA.  Stand pivot to w/c with CGA using grab bars.  Pt transported to sinkside to wash hands and sit<>stand for brushing teeth in standing with supervision.  Pt doffed/donned shirt overhead with setup.  Pt transported to ortho gym and completed dynamic standing activity with functional reach component to facilitate leaning in various directions outside base of support with pt requiring supervision and intermittent CGA.  Pt needing consistent VCs for safe hand placement during sit<>stand at each standing interval due to pt attempting to transfer with BUE on RW.  Pt then practiced simulated shower transfer over 2 inch threshold using RW with forward approach needing min assist and step by step VCs for sequencing.  Pt returned to room, call bell in reach, seat alarm on.  Therapy Documentation Precautions:  Precautions Precautions: Fall Restrictions Weight Bearing Restrictions: Yes RLE Weight Bearing: Weight bearing as tolerated LLE Weight Bearing: Weight bearing as tolerated   Therapy/Group: Individual Therapy  Ezekiel Slocumb 08/07/2020, 4:02 PM

## 2020-08-07 NOTE — Progress Notes (Signed)
Physical Therapy Session Note  Patient Details  Name: [name] [name] MRN: [id number] Date of Birth: 1951/05/23  Today's Date: 08/07/2020 PT Individual Time: 0802-0900 PT Individual Time Calculation (min): 58 min   Short Term Goals: Week 1:  PT Short Term Goal 1 (Week 1): STG=LTG due to ELOS  Skilled Therapeutic Interventions/Progress Updates:    pt received in bed and agreeable to therapy. Nursing present. Pt setup A for donning pants in bed with bridge technique used for pulling pants over hips, then directed in supine>sit with HOB slightly elevated CGA. Pt min A for Sit to stand to Rolling walker from bedside and stand pivot to WC. Pt taken to gym total A in [facility] for time and energy. Pt directed in x5 Sit to stand to Rolling walker at min A and VC for weight shifting onto LLE for equal standing on BLE. Pt directed in gait training 15' x5 grossly min A for safety and stability intermittent manual weight shifting to allow improved RLE limb advancement and normalized gait pattern. Pt directed in [facility] mobility to return to room min A with very poor cadence noted, total of 75' completed at min A. PT assisted rest of distance to pt's room  Total A for time. Pt directed in Sit to stand and pivot with Rolling walker to return to bed at end of session, min A and demonstrated improved technique with foot clearance of RLE and increased tolerated weight bearing on LLE to complete transfer. Sit>supine CGA and pt directed in bridging technique for upward scooting in bed for improved positioning CGA. Pt left in supine in bed, alarm set, All needs in reach and in good condition. Call light in hand.    Therapy Documentation Precautions:  Precautions Precautions: Fall Restrictions Weight Bearing Restrictions: Yes RLE Weight Bearing: Weight bearing as tolerated LLE Weight Bearing: Weight bearing as tolerated General:   Vital Signs:   Pain:   Mobility:   Locomotion :    Trunk/Postural Assessment  :    Balance:   Exercises:   Other Treatments:      Therapy/Group: Individual Therapy  Junie Panning 08/07/2020, 10:12 AM

## 2020-08-07 NOTE — Progress Notes (Signed)
[place] PHYSICAL MEDICINE & REHABILITATION PROGRESS NOTE   Subjective/Complaints: Patient seen standing and sitting down, work with therapy this morning.  She states she slept well overnight.  She states she had a very good bowel movement yesterday.  Good standing balance noted.  ROS: Denies CP, SOB, N/V/D  Objective:   No results found. Recent Labs    08/06/20 0534  WBC 5.4  HGB 10.9*  HCT 34.1*  PLT 202   Recent Labs    08/06/20 0534  NA 139  K 4.9  CL 103  CO2 27  GLUCOSE 106*  BUN 13  CREATININE 0.90  CALCIUM 9.5   No intake or output data in the 24 hours ending 08/07/20 1106      Physical Exam: Vital Signs Blood pressure 113/71, pulse 92, temperature 98.6 F (37 C), resp. rate 18, SpO2 98 %. Constitutional: No distress . Vital signs reviewed. HENT: Normocephalic.  Atraumatic. Eyes: EOMI. No discharge. Cardiovascular: No JVD.  RRR. Respiratory: Normal effort.  No stridor.  Bilateral clear to auscultation. GI: Non-distended.  BS +. Skin: Warm and dry.  Intact. Psych: Normal mood.  Normal behavior. Musc: Left hip with edema and tenderness, improving Neuro: Alert Motor: Bilateral upper extremities, right lower extremity: 5/5 proximal to distal  Left lower extremity: Hip flexion, knee extension 3 -/5 (pain inhibition), ankle dorsiflexion 4/5  Assessment/Plan: 1. Functional deficits which require 3+ hours per day of interdisciplinary therapy in a comprehensive inpatient rehab setting.  Physiatrist is providing close team supervision and 24 hour management of active medical problems listed below.  Physiatrist and rehab team continue to assess barriers to discharge/monitor patient progress toward functional and medical goals  Care Tool:  Bathing  Bathing activity did not occur: Refused (Pt declined) Body parts bathed by patient: Right arm,Left arm,Chest,Abdomen,Front perineal area,Buttocks,Right upper leg,Left upper leg,Right lower leg,Face   Body  parts bathed by helper: Left lower leg     Bathing assist Assist Level: Minimal Assistance - Patient > 75%     Upper Body Dressing/Undressing Upper body dressing   What is the patient wearing?: Pull over shirt    Upper body assist Assist Level: Set up assist    Lower Body Dressing/Undressing Lower body dressing      What is the patient wearing?: Pants,Underwear/pull up     Lower body assist Assist for lower body dressing: Minimal Assistance - Patient > 75%     Toileting Toileting Toileting Activity did not occur (Clothing management and hygiene only): N/A (no void or bm)  Toileting assist Assist for toileting: Minimal Assistance - Patient > 75%     Transfers Chair/bed transfer  Transfers assist     Chair/bed transfer assist level: Minimal Assistance - Patient > 75%     Locomotion Ambulation   Ambulation assist   Ambulation activity did not occur: Safety/medical concerns  Assist level: Minimal Assistance - Patient > 75% Assistive device: Walker-rolling Max distance: 5   Walk 10 feet activity   Assist  Walk 10 feet activity did not occur: Safety/medical concerns        Walk 50 feet activity   Assist Walk 50 feet with 2 turns activity did not occur: Safety/medical concerns         Walk 150 feet activity   Assist Walk 150 feet activity did not occur: Safety/medical concerns         Walk 10 feet on uneven surface  activity   Assist Walk 10 feet on uneven surfaces activity did  not occur: Safety/medical concerns         Wheelchair     Assist Will patient use wheelchair at discharge?: Yes Type of Wheelchair: Manual    Wheelchair assist level: Supervision/Verbal cueing Max wheelchair distance: 150    Wheelchair 50 feet with 2 turns activity    Assist        Assist Level: Supervision/Verbal cueing   Wheelchair 150 feet activity     Assist      Assist Level: Supervision/Verbal cueing     Medical Problem  List and Plan: 1.  Impaired mobility and ADLs secondary to closed pelvic fracture.  Continue CIR 2.  Antithrombotics: -DVT/anticoagulation:  Pharmaceutical: Lovenox             -antiplatelet therapy: N/A 3. Pain Management: Continue Tramadol, oxycodone as needed  Relatively controlled with meds on 12/14. 4. Mood: Team to order ego support. LCSW to follow for evaluation and support.              -antipsychotic agents: N/A 5. Neuropsych: This patient is capable of making decisions on her own behalf. 6. Skin/Wound Care: Routine pressure relief measure.  7. Fluids/Electrolytes/Nutrition: Monitor I/Os.  8. HTN: Monitor BP -   Decrease lisinopril to 2.5mg .   Controlled with meds on 12/14 9. Tachycardia: Monitor for symptoms with activity.   Borderline on 12/14 10. Anxiety: Continue Celexa 12. Slow transit constipation:   Changed senna to scheduled.  Bowel meds increased again on 12/13  Improving 13. Urinary incontinence:   Improved 14.  Acute blood loss anemia  Hemoglobin 10.9 on 12/13  Continue to monitor  LOS: 4 days A FACE TO FACE EVALUATION WAS PERFORMED  Kmya Placide Lorie Phenix 08/07/2020, 11:06 AM

## 2020-08-08 ENCOUNTER — Inpatient Hospital Stay (HOSPITAL_COMMUNITY): Payer: Medicare Other | Admitting: Occupational Therapy

## 2020-08-08 ENCOUNTER — Inpatient Hospital Stay (HOSPITAL_COMMUNITY): Payer: Medicare Other | Admitting: Physical Therapy

## 2020-08-08 ENCOUNTER — Inpatient Hospital Stay (HOSPITAL_COMMUNITY): Payer: Medicare Other

## 2020-08-08 NOTE — Progress Notes (Signed)
Physical Therapy Session Note  Patient Details  Name: [name] [name] MRN: [id number] Date of Birth: [birth year]  Today's Date: 08/08/2020 PT Individual Time: 1300-1400 and 1032-11 PT Individual Time Calculation (min): 60 min and 12mn  Short Term Goals: Week 1:  PT Short Term Goal 1 (Week 1): STG=LTG due to ELOS  Skilled Therapeutic Interventions/Progress Updates:   Session 1: Pt received sitting in wc and agreeable to PT.   Gait/Pre-Gait: -pt sit>stand with CGA and ambulated ~136fto hallway with CGA and RW. Pt demonstrates ability to clear R foot with greater ease this session than previous sessions.  -ambulated ~4213fith CGA and close supervision for the last ~63f46file using RW. Pt is very reliant on BUE support at this time. Pt notes that she has pain in her L hamstrings which she rates at a 4/10.  -step through in RW with L foot stationary. Pt instructed to move her R foot through swing phase of gait to promote increased R foot clearance during gait.   Pt left in wc with call bell in reach, needs met, and belt alarm on.   Session 2: Pt received in wc and agreeable to PT. Pt transported to therapy gym with total assist for time management and energy conservation.   Dynamic Gait Training: -Side stepping/sliding in parallel bars: pt instructed to initiate standing hip abduction in order to assess movement and pain with motion, prior to side stepping. Pt has extreme difficulty abducting RLE and is unable to pick up her R foot to complete movement. PT changed task to sliding foot out to the side in attempt to decrease pain. 2x8 each way -Forward/Backward walking in parallel bars: pt instructed to ambulate forward ~5ft,60fen repeat backwards x2. Pt demonstrates greater difficulty clearing R foot with bil UE supported by parallel bars than when using RW. Pt continues to c/o pain in her L hamstrings and states that it is sharp pain.  - Figure 8 through cones with RW ~25ft 40fl.  Pt remains with difficulty clearing R foot due to decreased weight shift onto LLE. PT provided tactile cueing for weight shift and pt with greater ability to advance RLE.  Balance: -Pt participated in cornhole activity with LUE on RW. x12 tosses. Pt with no noted LOB.   Pt ambulated ~10ft w43fRW and CGA, then returned to wc and transported back to room total assist. Performed stand pivot transfer to bedside with CGA and returned to supine with min assist for LE management. Left with call bell in reach, needs met, and bed alarm on.   Therapy Documentation Precautions:  Precautions Precautions: Fall Restrictions Weight Bearing Restrictions: Yes RLE Weight Bearing: Weight bearing as tolerated LLE Weight Bearing: Weight bearing as tolerated    Therapy/Group: Individual Therapy  Bishoy Cupp Gaylord Shih2021, 7:42 AM

## 2020-08-08 NOTE — Patient Care Conference (Signed)
Inpatient RehabilitationTeam Conference and Plan of Care Update Date: 08/08/2020   Time: 11:19 AM    Patient Name: [name] [name]      Medical Record Number: [id number]  Date of Birth: 1951/03/01 Sex: Female         Room/Bed: 4W20C/4W20C-01 Payor Info: Payor: MEDICARE / Plan: MEDICARE PART A AND B / Product Type: *No Product type* /    Admit Date/Time:  08/03/2020  6:35 PM  Primary Diagnosis:  Closed pelvic fracture [facility])  Hospital Problems: Principal Problem:   Closed pelvic fracture ([place]) Active Problems:   Acute blood loss anemia   Urinary incontinence   Slow transit constipation   Pelvic pain    Expected Discharge Date: Expected Discharge Date: 08/14/20  Team Members Present: Physician leading conference: Dr. Delice Lesch Care Coodinator Present: Ovidio Kin, LCSW;Elease Swarm Hervey Ard, RN, BSN, [place] Nurse Present: Other (comment) Philip Aspen, RN) PT Present: Barrie Folk, PT OT Present: Meriel Pica, OT PPS Coordinator present : Gunnar Fusi, Novella Olive, PT     Current Status/Progress Goal Weekly Team Focus  Bowel/Bladder   Continent B/B. LBM 08/04/20  maintain continence  Toilet q2h.   Swallow/Nutrition/ Hydration             ADL's   CGA transfers, min A LB  mod I overall  ADL training, functional mobility , pt education   Mobility   minA bed mobility, CGA sit>stand and stand<>pivot transfers with RW. Ambulated ~15-31ft with minA and RW  mod I, gait goal set to 22ft  dynamic standing balance, gait training as able (pain and self limiting). LE strengthening   Communication             Safety/Cognition/ Behavioral Observations            Pain   c/o pelvic pain increased with therapy.  Reach pain goal 2/10  continue to treat pain with tylenol, tramodol, or oxycodone according to level of pain   Skin   skin intact           Discharge Planning:  Plans to go to sister's home short time since someone is there with her and assisting with caring  for her dog.   Team Discussion: Issues with pain and constipation however doing well overall. Patient on target to meet rehab goals: yes, currently CGA for walker tranfers and requires little assistance for lower body self care except for cues for positioning. Able to ambulate 39' , Mod I goals set for discharge. *See Care Plan and progress notes for long and short-term goals.   Revisions to Treatment Plan:   Teaching Needs: Transfers, positioning, toileting, etc.  Current Barriers to Discharge: Decreased caregiver support  Possible Resolutions to Barriers:  Family education with sister    Medical Summary Current Status: Impaired mobility and ADLs secondary to closed pelvic fracture  Barriers to Discharge: Medical stability;Decreased family/caregiver support   Possible Resolutions to [employer] Focus: Therapies, follow labs - Hb, follow HR, optimize bowel meds, optimize pain meds, optimize BP   Continued Need for Acute Rehabilitation Level of Care: The patient requires daily medical management by a physician with specialized training in physical medicine and rehabilitation for the following reasons: Direction of a multidisciplinary physical rehabilitation program to maximize functional independence : Yes Medical management of patient stability for increased activity during participation in an intensive rehabilitation regime.: Yes Analysis of laboratory values and/or radiology reports with any subsequent need for medication adjustment and/or medical intervention. : Yes   I attest that  I was present, lead the team conference, and concur with the assessment and plan of the team.   Margarito Liner 08/08/2020, 2:08 PM

## 2020-08-08 NOTE — Progress Notes (Signed)
Physical Therapy Session Note  Patient Details  Name: [name] [name] MRN: [id number] Date of Birth: 05-27-1951  Today's Date: 08/08/2020 PT Individual Time: 0840-0904 PT Individual Time Calculation (min): 24 min   Short Term Goals: Week 1:  PT Short Term Goal 1 (Week 1): STG=LTG due to ELOS  Skilled Therapeutic Interventions/Progress Updates:     Patient in bed with MD leaving the room upon PT arrival. Patient alert and agreeable to PT session. Patient reported 5-6/10 pelvic pain during session, RN made aware and provided pain medicine during session. PT provided repositioning, rest breaks, and distraction as pain interventions throughout session.   Therapeutic Activity: Bed Mobility: Patient donned pants bed level performing bridging technique without cues with set-up assist. She performed supine to sit with CGA for facilitation of sequncing from a flat bed without use of bed rails to simulate home set-up x2 attempts for improved technique and pain management. Provided verbal cues for rolling R, setting R elbow to push to up to her elbow then hand to come to sitting without additional assist. Encouraged patient to perform transition quickly from side-lying to sitting for reduced pain with mobility.  Transfers: Patient performed sit to/from stand x1 with CGA with bed at lowest setting using RW. Provided verbal cues for scooting forward, hand placement, forward weight shift, safety with AD, and diaphragmatic breathing for pain management. She then perform pivot to w/c with CGA with increased time and cues for sequencing and weight shifting to perform stepping rather than shuffling during pivot. Provided cues for patient to reach back to sit to control descent.   Retrieved B ELRs for improved sitting tolerance in w/c during session.   Patient in w/c in the room with RN at end of session with breaks locked, seat belt alarm set, and all needs within reach.    Therapy  Documentation Precautions:  Precautions Precautions: Fall Restrictions Weight Bearing Restrictions: Yes RLE Weight Bearing: Weight bearing as tolerated LLE Weight Bearing: Weight bearing as tolerated    Therapy/Group: Individual Therapy  Daisy Mcneel L Warrick Llera PT, DPT  08/08/2020, 9:27 AM

## 2020-08-08 NOTE — Progress Notes (Signed)
[place] PHYSICAL MEDICINE & REHABILITATION PROGRESS NOTE   Subjective/Complaints: Patient seen sitting up in bed this morning.  She states she slept well overnight.  She states she is getting stronger.  She notes regular bowel movements the last 2 days.  ROS: Denies CP, SOB, N/V/D  Objective:   No results found. Recent Labs    08/06/20 0534  WBC 5.4  HGB 10.9*  HCT 34.1*  PLT 202   Recent Labs    08/06/20 0534  NA 139  K 4.9  CL 103  CO2 27  GLUCOSE 106*  BUN 13  CREATININE 0.90  CALCIUM 9.5    Intake/Output Summary (Last 24 hours) at 08/08/2020 1046 Last data filed at 08/08/2020 0811 Gross per 24 hour  Intake 358 ml  Output 1 ml  Net 357 ml        Physical Exam: Vital Signs Blood pressure 117/70, pulse 80, temperature 98 F (36.7 C), resp. rate 18, SpO2 100 %. Constitutional: No distress . Vital signs reviewed. HENT: Normocephalic.  Atraumatic. Eyes: EOMI. No discharge. Cardiovascular: No JVD.  RRR. Respiratory: Normal effort.  No stridor.  Bilateral clear to auscultation. GI: Non-distended.  BS +. Skin: Warm and dry.  Intact. Psych: Normal mood.  Normal behavior. Musc: Left hip with edema and tenderness, improving Neuro: Alert Motor: Bilateral upper extremities Right lower extremity: 4+/5 proximal to distal (pain inhibition) Left lower extremity: Hip flexion, knee extension 4-/5 (pain inhibition), ankle dorsiflexion 4+/5  Assessment/Plan: 1. Functional deficits which require 3+ hours per day of interdisciplinary therapy in a comprehensive inpatient rehab setting.  Physiatrist is providing close team supervision and 24 hour management of active medical problems listed below.  Physiatrist and rehab team continue to assess barriers to discharge/monitor patient progress toward functional and medical goals  Care Tool:  Bathing  Bathing activity did not occur: Refused (Pt declined) Body parts bathed by patient: Right arm,Left  arm,Chest,Abdomen,Front perineal area,Buttocks,Right upper leg,Left upper leg,Right lower leg,Face   Body parts bathed by helper: Left lower leg     Bathing assist Assist Level: Minimal Assistance - Patient > 75%     Upper Body Dressing/Undressing Upper body dressing   What is the patient wearing?: Pull over shirt    Upper body assist Assist Level: Set up assist    Lower Body Dressing/Undressing Lower body dressing      What is the patient wearing?: Pants,Underwear/pull up     Lower body assist Assist for lower body dressing: Minimal Assistance - Patient > 75%     Toileting Toileting Toileting Activity did not occur (Clothing management and hygiene only): N/A (no void or bm)  Toileting assist Assist for toileting: Contact Guard/Touching assist     Transfers Chair/bed transfer  Transfers assist     Chair/bed transfer assist level: Contact Guard/Touching assist     Locomotion Ambulation   Ambulation assist   Ambulation activity did not occur: Safety/medical concerns  Assist level: Minimal Assistance - Patient > 75% Assistive device: Walker-rolling Max distance: 5   Walk 10 feet activity   Assist  Walk 10 feet activity did not occur: Safety/medical concerns        Walk 50 feet activity   Assist Walk 50 feet with 2 turns activity did not occur: Safety/medical concerns         Walk 150 feet activity   Assist Walk 150 feet activity did not occur: Safety/medical concerns         Walk 10 feet on uneven surface  activity   Assist Walk 10 feet on uneven surfaces activity did not occur: Safety/medical concerns         Wheelchair     Assist Will patient use wheelchair at discharge?: Yes Type of Wheelchair: Manual    Wheelchair assist level: Supervision/Verbal cueing Max wheelchair distance: 150    Wheelchair 50 feet with 2 turns activity    Assist        Assist Level: Supervision/Verbal cueing   Wheelchair 150 feet  activity     Assist      Assist Level: Supervision/Verbal cueing     Medical Problem List and Plan: 1.  Impaired mobility and ADLs secondary to closed pelvic fracture.  Continue CIR  Team conference today to discuss current and goals and coordination of care, home and environmental barriers, and discharge planning with nursing, case manager, and therapies. Please see conference note from today as well.  2.  Antithrombotics: -DVT/anticoagulation:  Pharmaceutical: Lovenox             -antiplatelet therapy: N/A 3. Pain Management: Continue Tramadol, oxycodone as needed  Relatively controlled with meds on 12/15 4. Mood: Team to order ego support. LCSW to follow for evaluation and support.              -antipsychotic agents: N/A 5. Neuropsych: This patient is capable of making decisions on her own behalf. 6. Skin/Wound Care: Routine pressure relief measure.  7. Fluids/Electrolytes/Nutrition: Monitor I/Os.  8. HTN: Monitor BP -   Decrease lisinopril to 2.5mg .   Controlled with meds on 12/15 9. Tachycardia: Monitor for symptoms with activity.   Controlled on 12/15 10. Anxiety: Continue Celexa 12. Slow transit constipation:   Changed senna to scheduled.  Bowel meds increased again on 12/13  Improving 13. Urinary incontinence:   Resolved 14.  Acute blood loss anemia  Hemoglobin 10.9 on 12/13  Continue to monitor  LOS: 5 days A FACE TO FACE EVALUATION WAS PERFORMED  Damacio Weisgerber Lorie Phenix 08/08/2020, 10:46 AM

## 2020-08-08 NOTE — Progress Notes (Signed)
Occupational Therapy Session Note  Patient Details  Name: [name] [name] MRN: [id number] Date of Birth: July 11, 1951  Today's Date: 08/08/2020 OT Individual Time: 0930-1015 OT Individual Time Calculation (min): 45 min    Short Term Goals: Week 1:  OT Short Term Goal 1 (Week 1): STGs=LTGs due to short ELOS  Skilled Therapeutic Interventions/Progress Updates:    Pt received in wc and ready for therapy. She declined a shower today stating she felt clean from her shower yesterday and did not need to change clothing.  To work on [employer] strength and endurance, pt self propelled in wc to gym. Used her RW to stand up with CGA and pivoted to therapy mat. Spent this session working on B leg AROM focusing on L to enable her to wash and dress her feet more easily so she will not need to rely on AE.  Pt did state she was able to get her shoes on today.   From edge of mat, with feet on top of wash cloths, pt worked on active flex/ext of knees with a skiing motion, hip abd/add, and circles.  She can cross her R foot up onto her L knee with only minimal pain but can not yet internally rotate hip much.  Pt tolerated standing in RW with CGA and then pivoting back to wc.  Pt returned to her room and opted to stay in her wc.  Belt alarm on and all needs met.   Therapy Documentation Precautions:  Precautions Precautions: Fall Restrictions Weight Bearing Restrictions: Yes RLE Weight Bearing: Weight bearing as tolerated LLE Weight Bearing: Weight bearing as tolerated   Pain: pt stated her pain was "very tolerable" during session as she was premedicated     Therapy/Group: Individual Therapy  [place] 08/08/2020, 11:03 AM

## 2020-08-08 NOTE — Progress Notes (Signed)
Patient ID: [name] [name], female   DOB: 1951/07/27, 69 y.o.   MRN: [id number]  Met with pt to discuss team conference goals mod/i level and discharge date 12/21. She is pleased with this plan and feels she will be ready for discharge. She walked to the bathroom with her walker today and feels she is ding much better than she was. Has all equipment and follow up set up on acute with [facility]. Continue to follow until next Tuesday

## 2020-08-08 NOTE — Progress Notes (Signed)
Physical Therapy Session Note  Patient Details  Name: [name] [name] MRN: [id number] Date of Birth: 11/17/1950  Today's Date: 08/08/2020 PT Individual Time: 1545-1630 PT Individual Time Calculation (min): 45 min   Short Term Goals: Week 1:  PT Short Term Goal 1 (Week 1): STG=LTG due to ELOS  Skilled Therapeutic Interventions/Progress Updates:    Pt received seated in bed, agreeable to PT session. No complaints of pain at rest, reports onset of 5/10 pain in L pelvic region with mobility. Pt requests pain medication during session, nursing able to provide patient with medication. Bed mobility Supervision with use of bedrail. Sit to stand with CGA to RW. Stand pivot transfer to w/c with RW and CGA. Manual w/c propulsion x 150 ft with use of BUE at Supervision level. Stand pivot transfer w/c to/from mat table with RW and CGA. Seated BLE strengthening therex: marches, LAQ, ankle pumps, HS curls with yellow theraband, hip add squeeze x 20 reps each. Utilized Christmas music during session for improved patient mood. Pt returned to bed at end of session. Supervision for sit to supine with use of bedrail. Pt left seated in bed with needs in reach, bed alarm in place at end of session.  Therapy Documentation Precautions:  Precautions Precautions: Fall Restrictions Weight Bearing Restrictions: Yes RLE Weight Bearing: Weight bearing as tolerated LLE Weight Bearing: Weight bearing as tolerated    Therapy/Group: Individual Therapy   Excell Seltzer, PT, DPT  08/08/2020, 5:23 PM

## 2020-08-09 ENCOUNTER — Inpatient Hospital Stay (HOSPITAL_COMMUNITY): Payer: Medicare Other

## 2020-08-09 ENCOUNTER — Inpatient Hospital Stay (HOSPITAL_COMMUNITY): Payer: Medicare Other | Admitting: Occupational Therapy

## 2020-08-09 NOTE — Progress Notes (Signed)
Occupational Therapy Session Note  Patient Details  Name: [name] [name] MRN: [id number] Date of Birth: [birth year]  Today's Date: 08/09/2020 OT Individual Time: 1418-1530 OT Individual Time Calculation (min): 72 min    Short Term Goals: Week 1:  OT Short Term Goal 1 (Week 1): STGs=LTGs due to short ELOS  Skilled Therapeutic Interventions/Progress Updates:    Pt supine in bed, reports she just woke up from a nap. Pts sister present for session and actively participating in functional mobility training. Pt reports 5/10 pain in LLE and requested medication and administered by nurse at beginning of session.  Pt completed all functional mobility throughout session with supervision for intermittent VCs for safe RW mgt and hand placement.  Functional mobility included supine to sit, sit<>stand from EOB, ambulation EOB<>toilet, 3 in 1 commode transfer, and sit<>stand from w/c.  Pt completed toileting with supervision and had continent episode of bladder, slight incontinence of bowel therefore changed brief with setup/supervision.    Collaborated with pt's sister regarding dimensions shower doorway width and shower stall.  Pt transported to ADL suite for training on shower transfer with 2 inch threshold side to side step over using RW and shower chair as well requiring close supervision x 2 trials.  Pt's sister reports good understanding of all training provided and states she will be purchasing shower chair with back.  Pt's mobility improving overall with less VCs needed and progressing from CGA to supervision with RW.  Pt returned to supine in bed, call bell in reach, bed alarm on.  Therapy Documentation Precautions:  Precautions Precautions: Fall Restrictions Weight Bearing Restrictions: Yes RLE Weight Bearing: Weight bearing as tolerated LLE Weight Bearing: Weight bearing as tolerated   Therapy/Group: Individual Therapy  Ezekiel Slocumb 08/09/2020, 3:38 PM

## 2020-08-09 NOTE — Progress Notes (Signed)
Physical Therapy Session Note  Patient Details  Name: [name] [name] MRN: [id number] Date of Birth: 1951/05/21  Today's Date: 08/09/2020 PT Individual Time: 1100-1155 PT Individual Time Calculation (min): 55 min   Short Term Goals: Week 1:  PT Short Term Goal 1 (Week 1): STG=LTG due to ELOS  Skilled Therapeutic Interventions/Progress Updates:    Patient received sitting up in bed, agreeable to PT. She reports 4/10 pain in L LE, premedicated. PT providing rest breaks, repositioning and distractions to assist with pain management. She was able to come to sit edge of bed with supervision and transfer to wc with RW and CGA. She propelled herself in wc 120ft with supervision. Patient transferring to therapy mat with RW and supervision. She was able to complete the following therex seated edge of mat: - LAQ 2x12 with 3# ankle weight B LE -Marches 2x12 with 3# ankle weight R LE, no weight L LE -Hip abduction, theraband, 2x15 Patient requesting to review xrays as related to where she experiences pain. PT showing patient xrays in medical chart, orienting patient to anatomy and location of fracture as well as appropriate muscle attachments to pubic rami that may be contributing to her experienced symptoms. Patient without further questions at this time. Patient able to ambulate ~47ft with RW and CGA. She relies heavily on B UE support on RW to off weight B LE, L LE> R LE. Patient with decreased stance time L LE and decreased step length L LE. She returned to bed, bed alarm on, call light within reach.     Therapy Documentation Precautions:  Precautions Precautions: Fall Restrictions Weight Bearing Restrictions: Yes RLE Weight Bearing: Weight bearing as tolerated LLE Weight Bearing: Weight bearing as tolerated    Therapy/Group: Individual Therapy  Karoline Caldwell, PT, DPT, CBIS  08/09/2020, 7:49 AM

## 2020-08-09 NOTE — Progress Notes (Signed)
[place] PHYSICAL MEDICINE & REHABILITATION PROGRESS NOTE   Subjective/Complaints: Patient seen laying in bed this morning about to work with therapies.  She states she slept well overnight.  She states she wants to stay through Christmas but is relatively pleased with her discharge date.  She is questions regarding timing of her discharge.  ROS: Denies CP, SOB, N/V/D  Objective:   No results found. No results for input(s): WBC, HGB, HCT, PLT in the last 72 hours. No results for input(s): NA, K, CL, CO2, GLUCOSE, BUN, CREATININE, CALCIUM in the last 72 hours.  Intake/Output Summary (Last 24 hours) at 08/09/2020 1254 Last data filed at 08/08/2020 1556 Gross per 24 hour  Intake 240 ml  Output --  Net 240 ml        Physical Exam: Vital Signs Blood pressure 108/67, pulse 88, temperature 98.3 F (36.8 C), temperature source Oral, resp. rate 18, SpO2 98 %. Constitutional: No distress . Vital signs reviewed. HENT: Normocephalic.  Atraumatic. Eyes: EOMI. No discharge. Cardiovascular: No JVD.  RRR. Respiratory: Normal effort.  No stridor.  Bilateral clear to auscultation. GI: Non-distended.  BS +. Skin: Warm and dry.  Intact. Psych: Normal mood.  Normal behavior. Musc: Left hip with edema and tenderness, improving Neuro: Alert Motor: Bilateral upper extremities Right lower extremity: 4+/5 proximal to distal (pain inhibition) Left lower extremity: Hip flexion, knee extension 4--4/5 (pain inhibition), ankle dorsiflexion 4+/5  Assessment/Plan: 1. Functional deficits which require 3+ hours per day of interdisciplinary therapy in a comprehensive inpatient rehab setting.  Physiatrist is providing close team supervision and 24 hour management of active medical problems listed below.  Physiatrist and rehab team continue to assess barriers to discharge/monitor patient progress toward functional and medical goals  Care Tool:  Bathing  Bathing activity did not occur: Refused (Pt  declined) Body parts bathed by patient: Right arm,Left arm,Chest,Abdomen,Front perineal area,Buttocks,Right upper leg,Left upper leg,Right lower leg,Face,Left lower leg   Body parts bathed by helper: Left lower leg     Bathing assist Assist Level: Supervision/Verbal cueing     Upper Body Dressing/Undressing Upper body dressing   What is the patient wearing?: Pull over shirt    Upper body assist Assist Level: Set up assist    Lower Body Dressing/Undressing Lower body dressing      What is the patient wearing?: Pants,Underwear/pull up     Lower body assist Assist for lower body dressing: Supervision/Verbal cueing     Toileting Toileting Toileting Activity did not occur (Clothing management and hygiene only): N/A (no void or bm)  Toileting assist Assist for toileting: Supervision/Verbal cueing     Transfers Chair/bed transfer  Transfers assist     Chair/bed transfer assist level: Supervision/Verbal cueing     Locomotion Ambulation   Ambulation assist   Ambulation activity did not occur: Safety/medical concerns  Assist level: Minimal Assistance - Patient > 75% Assistive device: Walker-rolling Max distance: 5   Walk 10 feet activity   Assist  Walk 10 feet activity did not occur: Safety/medical concerns        Walk 50 feet activity   Assist Walk 50 feet with 2 turns activity did not occur: Safety/medical concerns         Walk 150 feet activity   Assist Walk 150 feet activity did not occur: Safety/medical concerns         Walk 10 feet on uneven surface  activity   Assist Walk 10 feet on uneven surfaces activity did not occur: Safety/medical concerns  Wheelchair     Assist Will patient use wheelchair at discharge?: Yes Type of Wheelchair: Manual    Wheelchair assist level: Supervision/Verbal cueing Max wheelchair distance: 150    Wheelchair 50 feet with 2 turns activity    Assist        Assist Level:  Supervision/Verbal cueing   Wheelchair 150 feet activity     Assist      Assist Level: Supervision/Verbal cueing     Medical Problem List and Plan: 1.  Impaired mobility and ADLs secondary to closed pelvic fracture.  Continue CIR 2.  Antithrombotics: -DVT/anticoagulation:  Pharmaceutical: Lovenox             -antiplatelet therapy: N/A 3. Pain Management: Continue Tramadol, oxycodone as needed  Relatively controlled with meds on 12/16 4. Mood: Team to order ego support. LCSW to follow for evaluation and support.              -antipsychotic agents: N/A 5. Neuropsych: This patient is capable of making decisions on her own behalf. 6. Skin/Wound Care: Routine pressure relief measure.  7. Fluids/Electrolytes/Nutrition: Monitor I/Os.  8. HTN: Monitor BP  Decrease lisinopril to 2.5mg .   Controlled with meds on 12/16 9. Tachycardia: Monitor for symptoms with activity.   Controlled on 12/16 10. Anxiety: Continue Celexa 12. Slow transit constipation:   Changed senna to scheduled.  Bowel meds increased again on 12/13  Improving with meds 13. Urinary incontinence:   Resolved 14.  Acute blood loss anemia  Hemoglobin 10.9 on 12/13, labs ordered for tomorrow  Continue to monitor  LOS: 6 days A FACE TO FACE EVALUATION WAS PERFORMED  Jaevion Goto Lorie Phenix 08/09/2020, 12:54 PM

## 2020-08-09 NOTE — Progress Notes (Signed)
Occupational Therapy Session Note  Patient Details  Name: YSABEL STANKOVICH MRN: [id number] Date of Birth: November 19, 1950  Today's Date: 08/09/2020 OT Individual Time: 0539-7673 OT Individual Time Calculation (min): 75 min    Short Term Goals: Week 1:  OT Short Term Goal 1 (Week 1): STGs=LTGs due to short ELOS  Skilled Therapeutic Interventions/Progress Updates:      Pt seen for BADL retraining of toileting, bathing, and dressing with a focus on functional mobility and tolerance to weight bearing in standing/ambulation.  Pt received in bed and anxious for a shower.  Using a RW, she was able to ambulate around her room, stand at sink, transfer to toilet, toilet and dress all with close supervision.  She only needed a little A with drying her feet before slipping on her shoes.    Pt is moving more efficiently and states she is feeling much stronger with only slight pain.   Therapy Documentation Precautions:  Precautions Precautions: Fall Restrictions Weight Bearing Restrictions: Yes RLE Weight Bearing: Weight bearing as tolerated LLE Weight Bearing: Weight bearing as tolerated    Vital Signs: Therapy Vitals Temp: 98.3 F (36.8 C) Temp Source: Oral Pulse Rate: 88 Resp: 18 BP: 108/67 Patient Position (if appropriate): Lying Oxygen Therapy SpO2: 98 % O2 Device: Room Air Pain:  3/10 L hip/pelvis - premedicated   Therapy/Group: Individual Therapy  [place] 08/09/2020, 9:10 AM

## 2020-08-10 ENCOUNTER — Inpatient Hospital Stay (HOSPITAL_COMMUNITY): Payer: Medicare Other

## 2020-08-10 ENCOUNTER — Inpatient Hospital Stay (HOSPITAL_COMMUNITY): Payer: Medicare Other | Admitting: Occupational Therapy

## 2020-08-10 ENCOUNTER — Encounter (HOSPITAL_COMMUNITY): Payer: Medicare Other | Admitting: Psychology

## 2020-08-10 LAB — CBC WITH DIFFERENTIAL/PLATELET
Abs Immature Granulocytes: 0.02 10*3/uL (ref 0.00–0.07)
Basophils Absolute: 0 10*3/uL (ref 0.0–0.1)
Basophils Relative: 1 %
Eosinophils Absolute: 0.2 10*3/uL (ref 0.0–0.5)
Eosinophils Relative: 4 %
HCT: 33.2 % — ABNORMAL LOW (ref 36.0–46.0)
Hemoglobin: 10.8 g/dL — ABNORMAL LOW (ref 12.0–15.0)
Immature Granulocytes: 0 %
Lymphocytes Relative: 15 %
Lymphs Abs: 0.7 10*3/uL (ref 0.7–4.0)
MCH: 30.9 pg (ref 26.0–34.0)
MCHC: 32.5 g/dL (ref 30.0–36.0)
MCV: 95.1 fL (ref 80.0–100.0)
Monocytes Absolute: 0.6 10*3/uL (ref 0.1–1.0)
Monocytes Relative: 12 %
Neutro Abs: 3.2 10*3/uL (ref 1.7–7.7)
Neutrophils Relative %: 68 %
Platelets: 236 10*3/uL (ref 150–400)
RBC: 3.49 MIL/uL — ABNORMAL LOW (ref 3.87–5.11)
RDW: 11.5 % (ref 11.5–15.5)
WBC: 4.7 10*3/uL (ref 4.0–10.5)
nRBC: 0 % (ref 0.0–0.2)

## 2020-08-10 NOTE — Consult Note (Signed)
Neuropsychological Consultation   Patient:   [name] [name]   DOB:   August 20, 1951  MR Number:  [id number]  Location:  [facility] [id number] [place] [address] Dept: [place]: [phone]           Date of Service:   08/10/2020  Start Time:   8 AM End Time:   9 AM  Provider/Observer:  Ilean Skill, Psy.D.       Clinical Neuropsychologist       Billing Code/Service: [phone]  Chief Complaint:    [name] [name] is a 69 year old female with a history of hypertension, stage III lung cancer with previous chemo and radiation treatment 15 years ago, OA, angina, anxiety disorder.  Patient was admitted on 07/30/2020 after fall over a stool while trying to swat at an insect.  The patient had a loss of balance falling to the ground.  Acute left hip pain was noted with difficulty ambulating.  Patient was found to have acute left superior and inferior pubic Ramey fracture with small adjacent pelvic hematomas.  Ortho recommended WBAT and to follow-up with Ortho in 2 to 3 weeks.  Patient's hospital course has been significant for difficulty with weightbearing and lower left extremity due to pain, weakness as well as hypotension as well as tachycardia.  Patient was admitted to CIR due to impaired mobility and difficulties with ADL secondary to closed pelvic fracture.  Reason for Service:  Patient was referred for neuropsychological consultation due to coping and adjusting to extended hospital stay with significant pelvic pain and limitations leading to an inability to adequately perform ADLs.  Below is the HPI for the current admission.  HPI: [name] is a 69 year old female with history of HTN, stage III lung cancer s/p chemo/XRT, OA, anemia, anxiety d/o who was admitted on 07/30/20 after a fall off a stool due to LOB with onset of acute left hip pain with difficulty  ambulating. She was found to have acute left superior and inferior pubic rami Fx with small adjacent pelvic hematomas. Ortho recommended WBAT and to follow up with Dr. Percell Miller in 2-3 weeks. Hospital course significant for issues with difficulty WB LLE due to  pain, weakness, hypotension as well as tachycardia.  Lisinopril was decreased to 10 mg daily and Celexa decreased due to age?Marland Kitchen  Admitted to CIR with impaired mobility and ADLs secondary to closed pelvic fracture. She currently complains of being unable to use purewick and feeling urge to urinate, and of pain at fracture site.   Current Status:  Entering the room, the patient was alert and oriented and sitting upright in her bed in the inclined position.  Patient acknowledged having some ongoing pain issues particularly with weightbearing and movement.  Patient reports that her mood has been quite positive.  Mental status and cognition appear to be well within normal limits.  The patient displayed a euthymic and bright affect throughout visit.  Patient talked about her experience with lung cancer treatment and that she is being followed with CAT scans every 6 months but at this point she has not had a reoccurrence of her lung cancer or issues with metastasis of her brain that was also treated at the time.  Behavioral Observation: [name]  presents as a 69 y.o.-year-old Right Caucasian Female who appeared her stated age. her dress was Appropriate and she was Well Groomed and her manners were Appropriate to  the situation.  her participation was indicative of Appropriate and Attentive behaviors.  There were physical disabilities noted.  she displayed an appropriate level of cooperation and motivation.     Interactions:    Active Appropriate and Attentive  Attention:   within normal limits and attention span and concentration were age appropriate  Memory:   within normal limits; recent and remote memory intact  Visuo-spatial:  not  examined  Speech (Volume):  normal  Speech:   normal; normal  Thought Process:  Coherent and Relevant  Though Content:  WNL; not suicidal and not homicidal  Orientation:   person, place, time/date and situation  Judgment:   Good  Planning:   Good  Affect:    Appropriate  Mood:    Euthymic  Insight:   Good  Intelligence:   normal  Medical History:   Past Medical History:  Diagnosis Date  . Anemia    duering cancer treatment   . Anxiety   . Arthritis    OA hands   . Bilateral lower extremity pain   . Blood transfusion without reported diagnosis    during chemo for lung cancer  . Cataract    forming  but very small   . Cholelithiasis   . Colon polyps    adenomatous  . High cholesterol   . History of chemotherapy    for lung cancer- completed 2013  . Hx of radiation therapy 10/07/11 to 11/20/11   right lung  . Hx of radiation therapy 01/07/2012- 01/21/12   cranial irradiation  . Hypertension   . Lung cancer ([facility]) 09/19/11   Stage III currently in remission  . Malignant melanoma of skin of canthus of right eye ([facility]) 02/2017  . Osteoporosis 04/2014   Dexa scan by Dr. Corinna Capra, T-score -2.7, next in 2 years, with at least 13 % major fracture in 10 years  . Pitting edema    Bilateral lower extremities, duplex ultrasound 04/03/2015 normal, no DVT         Patient Active Problem List   Diagnosis Date Noted  . Acute blood loss anemia   . Urinary incontinence   . Slow transit constipation   . Pelvic pain   . Closed pelvic fracture ([facility]) 08/03/2020  . Closed fracture of left inferior pubic ramus ([place]) 07/30/2020  . Closed fracture of left superior pubic ramus ([facility]) 07/30/2020  . Left hip pain 07/30/2020  . GAD (generalized anxiety disorder) 07/30/2020  . DJD (degenerative joint disease) 09/20/2019  . Familial hyperlipidemia 07/01/2018  . Right shoulder pain 07/14/2017  . Malignant melanoma of skin of canthus of right eye ([facility]) 03/16/2017  . PCP NOTES >>>>>>>>>>>>>>>  05/22/2015  . High coronary artery calcium score 02/14/2015  . Pain of lower leg 02/15/2014  . Hernia, abdominal 04/01/2013  . Depression with anxiety 04/01/2012  . Malignant neoplasm of upper lobe, bronchus or lung 09/30/2011  . H/O: lung cancer 09/25/2011  . Annual physical exam 09/05/2011  . IBS -diarrhea predominant 09/05/2011  . COPD (chronic obstructive pulmonary disease) ([facility]) 09/05/2011  . Osteoporosis 03/01/2010  . TOBACCO ABUSE 11/14/2008  . Dyslipidemia 05/18/2007  . Essential hypertension 05/18/2007     Psychiatric History:  While the patient is a past history of anxiety and treatment for anxiety including prescription for alprazolam and Celexa she denies any acute worsening of her anxiety and reports that it is well under control and has remained so.  Family Med/Psych History:  Family History  Problem Relation Age of Onset  .  Bladder Cancer Father   . Stroke Father   . Colon polyps Father   . Atrial fibrillation Mother   . Heart disease Mother        CHF  . Clotting disorder Mother        warfarin  . Colon polyps Mother   . Colon cancer Maternal Aunt 38  . Drug abuse Son   . Breast cancer Neg Hx   . Esophageal cancer Neg Hx   . Stomach cancer Neg Hx   . Rectal cancer Neg Hx    Impression/DX:  AZELIA REIGER is a 69 year old female with a history of hypertension, stage III lung cancer with previous chemo and radiation treatment 15 years ago, OA, angina, anxiety disorder.  Patient was admitted on 07/30/2020 after fall over a stool while trying to swat at an insect.  The patient had a loss of balance falling to the ground.  Acute left hip pain was noted with difficulty ambulating.  Patient was found to have acute left superior and inferior pubic Ramey fracture with small adjacent pelvic hematomas.  Ortho recommended WBAT and to follow-up with Ortho in 2 to 3 weeks.  Patient's hospital course has been significant for difficulty with weightbearing and lower left  extremity due to pain, weakness as well as hypotension as well as tachycardia.  Patient was admitted to CIR due to impaired mobility and difficulties with ADL secondary to closed pelvic fracture.  Entering the room, the patient was alert and oriented and sitting upright in her bed in the inclined position.  Patient acknowledged having some ongoing pain issues particularly with weightbearing and movement.  Patient reports that her mood has been quite positive.  Mental status and cognition appear to be well within normal limits.  The patient displayed a euthymic and bright affect throughout visit.  Patient talked about her experience with lung cancer treatment and that she is being followed with CAT scans every 6 months but at this point she has not had a reoccurrence of her lung cancer or issues with metastasis of her brain that was also treated at the time.  Disposition/Plan:  Today we reviewed current symptomatology and addressed issues of possible worsening of underlying anxiety symptoms.  The patient appears to be coping and managing quite well given what is happened to her acutely as well as coping and adapting to previous issues with lung cancer and metastasis.  Diagnosis:    Closed pelvic fracture of the left inferior pelvic ramus, pelvic pain        Electronically Signed   _______________________ Ilean Skill, Psy.D. Clinical Neuropsychologist

## 2020-08-10 NOTE — Progress Notes (Signed)
PHYSICAL MEDICINE & REHABILITATION PROGRESS NOTE   Subjective/Complaints: Patient seen sitting up in her chair this morning.  She states she slept well overnight.  She has questions regarding use of restroom versus bedpan.  She requests K pad.  ROS: Denies CP, SOB, N/V/D  Objective:   No results found. Recent Labs    08/10/20 0523  WBC 4.7  HGB 10.8*  HCT 33.2*  PLT 236   No results for input(s): NA, K, CL, CO2, GLUCOSE, BUN, CREATININE, CALCIUM in the last 72 hours.  Intake/Output Summary (Last 24 hours) at 08/10/2020 1226 Last data filed at 08/10/2020 0941 Gross per 24 hour  Intake 1080 ml  Output --  Net 1080 ml        Physical Exam: Vital Signs Blood pressure 130/82, pulse 100, temperature 98.1 F (36.7 C), temperature source Oral, resp. rate 16, SpO2 100 %. Constitutional: No distress . Vital signs reviewed. HENT: Normocephalic.  Atraumatic. Eyes: EOMI. No discharge. Cardiovascular: No JVD.  RRR. Respiratory: Normal effort.  No stridor.  Bilateral clear to auscultation. GI: Non-distended.  BS +. Skin: Warm and dry.  Intact. Psych: Normal mood.  Normal behavior. Musc: Left hip with improving edema Neuro: Alert Motor: Bilateral upper extremities Right lower extremity: 4+/5 proximal to distal (pain inhibition) Left lower extremity: Hip flexion, knee extension 4--4/5 (pain inhibition), ankle dorsiflexion 4+/5, stable  Assessment/Plan: 1. Functional deficits which require 3+ hours per day of interdisciplinary therapy in a comprehensive inpatient rehab setting.  Physiatrist is providing close team supervision and 24 hour management of active medical problems listed below.  Physiatrist and rehab team continue to assess barriers to discharge/monitor patient progress toward functional and medical goals  Care Tool:  Bathing  Bathing activity did not occur: Refused (Pt declined) Body parts bathed by patient: Right arm,Left arm,Chest,Abdomen,Front  perineal area,Buttocks,Right upper leg,Left upper leg,Right lower leg,Face,Left lower leg   Body parts bathed by helper: Left lower leg     Bathing assist Assist Level: Supervision/Verbal cueing     Upper Body Dressing/Undressing Upper body dressing   What is the patient wearing?: Pull over shirt    Upper body assist Assist Level: Set up assist    Lower Body Dressing/Undressing Lower body dressing      What is the patient wearing?: Pants,Underwear/pull up     Lower body assist Assist for lower body dressing: Supervision/Verbal cueing     Toileting Toileting Toileting Activity did not occur (Clothing management and hygiene only): N/A (no void or bm)  Toileting assist Assist for toileting: Supervision/Verbal cueing     Transfers Chair/bed transfer  Transfers assist     Chair/bed transfer assist level: Supervision/Verbal cueing     Locomotion Ambulation   Ambulation assist   Ambulation activity did not occur: Safety/medical concerns  Assist level: Contact Guard/Touching assist Assistive device: Walker-rolling Max distance: 75   Walk 10 feet activity   Assist  Walk 10 feet activity did not occur: Safety/medical concerns  Assist level: Contact Guard/Touching assist Assistive device: Walker-rolling   Walk 50 feet activity   Assist Walk 50 feet with 2 turns activity did not occur: Safety/medical concerns  Assist level: Contact Guard/Touching assist Assistive device: Walker-rolling    Walk 150 feet activity   Assist Walk 150 feet activity did not occur: Safety/medical concerns         Walk 10 feet on uneven surface  activity   Assist Walk 10 feet on uneven surfaces activity did not occur: Safety/medical concerns  Wheelchair     Assist Will patient use wheelchair at discharge?: Yes Type of Wheelchair: Manual    Wheelchair assist level: Independent Max wheelchair distance: 150    Wheelchair 50 feet with 2 turns  activity    Assist        Assist Level: Independent   Wheelchair 150 feet activity     Assist      Assist Level: Independent     Medical Problem List and Plan: 1.  Impaired mobility and ADLs secondary to closed pelvic fracture.  Continue CIR 2.  Antithrombotics: -DVT/anticoagulation:  Pharmaceutical: Lovenox             -antiplatelet therapy: N/A 3. Pain Management: Continue Tramadol, oxycodone as needed  Relatively controlled with meds on 12/17 4. Mood: Team to order ego support. LCSW to follow for evaluation and support.              -antipsychotic agents: N/A 5. Neuropsych: This patient is capable of making decisions on her own behalf. 6. Skin/Wound Care: Routine pressure relief measure.  7. Fluids/Electrolytes/Nutrition: Monitor I/Os.  8. HTN: Monitor BP  Decrease lisinopril to 2.5mg .   Controlled with meds on 12/17 9. Tachycardia: Monitor for symptoms with activity.   Controlled on 12/17 10. Anxiety: Continue Celexa 12. Slow transit constipation:   Changed senna to scheduled.  Bowel meds increased again on 12/13  Improved with meds 13. Urinary incontinence:   Resolved 14.  Acute blood loss anemia  Hemoglobin 10.8 on 12/17  Continue to monitor  LOS: 7 days A FACE TO FACE EVALUATION WAS PERFORMED  Makennah Omura Lorie Phenix 08/10/2020, 12:26 PM

## 2020-08-10 NOTE — Progress Notes (Signed)
Physical Therapy Session Note  Patient Details  Name: [name] [name] MRN: [id number] Date of Birth: 1951-03-22  Today's Date: 08/10/2020 PT Individual Time: 0900-1000 PT Individual Time Calculation (min): 60 min   Short Term Goals: Week 1:  PT Short Term Goal 1 (Week 1): STG=LTG due to ELOS  Skilled Therapeutic Interventions/Progress Updates:    session 1: Patient received sitting up in wc, agreeable to PT. She reports 3-4/10 pain in pelvis/L LE, premedicated. PT providing rest breaks, repositioning and distractions to assist with pain management. She was able to don pants with supervision and use of RW to stand to pull up pants. Patient propelling herself in wc to therapy gym with supervision. Patient transferring to mat to complete following therex: LAQ 3# ankle weight B, 3x12 R LE seated marches 3# ankle weight 3x12 L LE standing marches with no weight, 3x12 Seated hip abduction 3x12, theraband  Seated scap retractions, theraband, 3x12  Seated boxing with no LE support to encourage further core engagement.  Patient transferring back to wc and was able to propel herself in wc to her room ModI. Seatbelt alarm on, call light within reach.   Therapy Documentation Precautions:  Precautions Precautions: Fall Restrictions Weight Bearing Restrictions: Yes RLE Weight Bearing: Weight bearing as tolerated LLE Weight Bearing: Weight bearing as tolerated    Therapy/Group: Individual Therapy  Karoline Caldwell, PT, DPT, CBIS  08/10/2020, 7:44 AM

## 2020-08-10 NOTE — Discharge Summary (Signed)
Physician Discharge Summary  Patient ID: [name] [name] MRN: [id number] DOB/AGE: 1951-06-11 69 y.o.  Admit date: 08/03/2020 Discharge date: 08/15/2020  Discharge Diagnoses:  Principal Problem:   Closed pelvic fracture [facility]) Active Problems:   Acute blood loss anemia   Urinary incontinence   Slow transit constipation   Pelvic pain   Hyperglycemia   Benign essential HTN   Discharged Condition: stable   Significant Diagnostic Studies: N/A   Labs:  Basic Metabolic Panel: BMP Latest Ref Rng & Units 08/13/2020 08/06/2020 08/04/2020  Glucose 70 - 99 mg/dL 103(H) 106(H) 94  BUN 8 - 23 mg/dL 8 13 11   Creatinine 0.44 - 1.00 mg/dL 0.89 0.90 0.95  Sodium 135 - 145 mmol/L 138 139 138  Potassium 3.5 - 5.1 mmol/L 4.8 4.9 4.7  Chloride 98 - 111 mmol/L 104 103 102  CO2 22 - 32 mmol/L 25 27 25   Calcium 8.9 - 10.3 mg/dL 9.2 9.5 9.5    CBC: CBC Latest Ref Rng & Units 08/13/2020 08/10/2020 08/06/2020  WBC 4.0 - 10.5 K/uL 4.9 4.7 5.4  Hemoglobin 12.0 - 15.0 g/dL 11.0(L) 10.8(L) 10.9(L)  Hematocrit 36.0 - 46.0 % 34.6(L) 33.2(L) 34.1(L)  Platelets 150 - 400 K/uL 263 236 202    CBG: No results for input(s): GLUCAP in the last 168 hours.  Brief HPI:   [name] [name] is a 69 y.o. female with history of HTN, stage III lung cancer s/p chemo/XRT, OA, anemia, anxiety disorder who was admitted on 07/30/2020 after loss of balance with acute onset of left hip pain and difficulty ambulating. She was found to have acute left superior inferior pubic rami fracture with small adjacent pelvic hematomas and was made WBAT. Recommendations are to follow-up with Dr. Percell Miller after discharge. Hospital course significant for issues with hypotension as well as tachycardia, pain with difficulty weightbearing through LLE. Therapy was ongoing and patient was limited by weakness as well as pain affecting ADLs and mobility. CIR was recommended due to functional decline.   Hospital Course: [name] [name] was admitted to rehab 08/03/2020 for inpatient therapies to consist of PT and OT at least three hours five days a week. Past admission physiatrist, therapy team and rehab RN have worked together to provide customized collaborative inpatient rehab. Lovenox was added for DVT prophylaxis and follow-up check of CBC shows H&H to be stable. Blood pressures have been monitored on twice daily basis have been controlled on decreased dose lisinopril. Her anxiety levels unknown have improved with steady improvement in activity tolerance. Pain is currently controlled with prn use of tylenol.  She has made steady gains during her rehab stay and is currently at modified independent to supervision level.  She will continue to receive follow-up home health PT and OT, home health after discharge   Rehab course: During patient's stay in rehab team conference was held to monitor patient's progress, set goals and discuss barriers to discharge. At admission, patient required min assist with mobility and with basic ADL tasks. She  has had improvement in activity tolerance, balance, postural control as well as ability to compensate for deficits. She is able to complete ADL task at modified independent level. She requires supervision for transfers and is able to ambulate up 100 feet with rolling walker.  Sister plans on providing supervision after discharge  Disposition:  Home  Diet: Regular  Special Instructions: 1. No driving or strenuous activity till cleared by MD.   Discharge Instructions    Ambulatory referral to Physical Medicine  Rehab   Complete by: As directed    1-2 weeks TC appt     Allergies as of 08/14/2020      Reactions   Codeine Nausea And Vomiting   Penicillins Rash      Medication List    TAKE these medications   acetaminophen 325 MG tablet Commonly known as: TYLENOL Take 650 mg by mouth every 6 (six) hours as needed for mild pain, moderate pain or fever.   ALPRAZolam 0.5 MG  tablet Commonly known as: XANAX Take 1 tablet (0.5 mg total) by mouth daily as needed for anxiety.   aspirin EC 81 MG tablet Take 81 mg daily by mouth.   citalopram 20 MG tablet Commonly known as: CELEXA Take 1 tablet (20 mg total) by mouth daily.   Cyanocobalamin 2500 MCG Chew Chew 2 tablets by mouth daily.   lisinopril 2.5 MG tablet Commonly known as: ZESTRIL Take 1 tablet (2.5 mg total) by mouth daily. What changed:   medication strength  how much to take   oxyCODONE 5 MG immediate release tablet--Rx# 10 pills Commonly known as: Oxy IR/ROXICODONE Take 1 tablet (5 mg total) by mouth 2 (two) times daily as needed for severe pain. What changed:   how much to take  when to take this  reasons to take this   polyvinyl alcohol 1.4 % ophthalmic solution Commonly known as: LIQUIFILM TEARS Place 1 drop into both eyes as needed for dry eyes.   raloxifene 60 MG tablet Commonly known as: EVISTA Take 1 tablet (60 mg total) by mouth daily.   Repatha SureClick 161 MG/ML So[facility] Generic drug: Evolocumab Inject 1 Dose into the skin every 14 (fourteen) days.   rosuvastatin 40 MG tablet Commonly known as: CRESTOR Take 1 tablet (40 mg total) by mouth at bedtime.   senna-docusate 8.6-50 MG tablet Commonly known as: Senokot-S Take 2 tablets by mouth at bedtime. What changed:   when to take this  reasons to take this   traMADol 50 MG tablet--Rx #20 pills Commonly known as: ULTRAM Take 1 tablet (50 mg total) by mouth every 6 (six) hours as needed for moderate pain.   traZODone 50 MG tablet Commonly known as: DESYREL Take 0.5-1 tablets (25-50 mg total) by mouth at bedtime as needed for sleep.   Vitamin D3 50 MCG (2000 UT) capsule Take 4,000 Units by mouth daily.       Follow-up Information    Jamse Arn, MD Follow up.   Specialty: Physical Medicine and Rehabilitation Why: Office will call you with follow up appointment Contact information: [address] [place] [address] [phone]        Colon Branch, MD. Call.   Specialty: Internal Medicine Why: for post hospital follow up Contact information: [address] [place] [address] [place] [address] [phone]        Renette Butters, MD. Call.   Specialty: Orthopedic Surgery Why: for follow up on pelvic fracture Contact information: [address] [place] [address] [phone]               Signed: Bary Leriche 08/16/2020, 8:05 PM

## 2020-08-10 NOTE — Progress Notes (Signed)
Occupational Therapy Weekly Progress Note  Patient Details  Name: [name] [name] MRN: [id number] Date of Birth: [birth year]  Beginning of progress report period: August 04, 2020 End of progress report period: August 10, 2020  Today's Date: 08/10/2020 OT Individual Time: 1435-1530 OT Individual Time Calculation (min): 55 min    Patient has met 2 of 7 long term goals.  Short term goals not set due to estimated length of stay.  Pt is progressing steadily towards unmet goals as well with decreased pain level overall and increased safety awareness and carryover of compensatory strategies during functional mobility and self care.  Pt has progressed to supervision overall and will have assist from supportive sister as needed at discharge.  Pt is consistently motivated to participated with OT to facilitate return to PLOF.  Patient continues to demonstrate the following deficits: muscle weakness and decreased standing balance and decreased balance strategies and therefore will continue to benefit from skilled OT intervention to enhance overall performance with BADL.  See Patient's Care Plan for progression toward long term goals.  Patient progressing toward long term goals..  Continue plan of care.  Skilled Therapeutic Interventions/Progress Updates:    Pt supine in bed, requesting to use bathroom.  Pt completed bed mobility, ambulation to bathroom using RW and 3 in 1 commode transfer with supervision.  Pt completed clothing mgt and pericare with distant supervision.  Pt ambulated to sink using RW and washed hands with supervision.  Pt returned to sitting EOB for brief rest break and then completed further ambulation approximately 30 feet using RW with supervision and no LOB noted, slow gait pattern due to pain level. Pt reports feeling "sore" and requesting back to bed.   Pts sister arrived, and she reports concern about pt getting in and out of guest bed due to height; height of bed at home  measuring 27 inches.  Pt practiced stand <>sit and sit<>supine from hospital bed placed at 27 inches with supervision without difficulty. Pt returned to supine and bridged to move towards [facility] with mod I.  Call bell in reach, bed alarm on.     Therapy Documentation Precautions:  Precautions Precautions: Fall Restrictions Weight Bearing Restrictions: Yes RLE Weight Bearing: Weight bearing as tolerated LLE Weight Bearing: Weight bearing as tolerated   Therapy/Group: Individual Therapy  Ezekiel Slocumb 08/10/2020, 4:09 PM

## 2020-08-11 ENCOUNTER — Inpatient Hospital Stay (HOSPITAL_COMMUNITY): Payer: Medicare Other

## 2020-08-11 DIAGNOSIS — S32301S Unspecified fracture of right ilium, sequela: Secondary | ICD-10-CM

## 2020-08-11 NOTE — Progress Notes (Signed)
[place] [facility] PHYSICAL MEDICINE & REHABILITATION PROGRESS NOTE   Subjective/Complaints:  Pt reports leg hurting bad- esp when stands if doesn't take pain meds (only tylenol) encouraged her she might need pain meds for a few more days.   LBM yesterday- wants to now- and it's working finally.  .  ROS: CTA B/L- no W/R/R- good air movement   Objective:   No results found. Recent Labs    08/10/20 0523  WBC 4.7  HGB 10.8*  HCT 33.2*  PLT 236   No results for input(s): NA, K, CL, CO2, GLUCOSE, BUN, CREATININE, CALCIUM in the last 72 hours.  Intake/Output Summary (Last 24 hours) at 08/11/2020 1741 Last data filed at 08/11/2020 1300 Gross per 24 hour  Intake 460 ml  Output --  Net 460 ml        Physical Exam: Vital Signs Blood pressure 116/78, pulse 86, temperature 98.4 F (36.9 C), resp. rate 16, weight 73.4 kg, SpO2 99 %. Constitutional: sitting up in bed- appropriate, NAD HENT: Normocephalic.  Atraumatic. Eyes: EOMI. No discharge. Cardiovascular: RRR Respiratory: CTA B/L- no W/R/R- good air movement GI: Soft, NT, ND, (+)BS  Skin: Warm and dry.  Intact. Psych: Normal mood.  Normal behavior. Musc: Left hip with improving edema- still very TTP Neuro: Alert Motor: Bilateral upper extremities Right lower extremity: 4+/5 proximal to distal (pain inhibition) Left lower extremity: Hip flexion, knee extension 4--4/5 (pain inhibition), ankle dorsiflexion 4+/5, stable  Assessment/Plan: 1. Functional deficits which require 3+ hours per day of interdisciplinary therapy in a comprehensive inpatient rehab setting.  Physiatrist is providing close team supervision and 24 hour management of active medical problems listed below.  Physiatrist and rehab team continue to assess barriers to discharge/monitor patient progress toward functional and medical goals  Care Tool:  Bathing  Bathing activity did not occur: Refused (Pt declined) Body parts bathed by patient: Right arm,Left  arm,Chest,Abdomen,Front perineal area,Buttocks,Right upper leg,Left upper leg,Right lower leg,Face,Left lower leg   Body parts bathed by helper: Left lower leg     Bathing assist Assist Level: Supervision/Verbal cueing     Upper Body Dressing/Undressing Upper body dressing   What is the patient wearing?: Pull over shirt    Upper body assist Assist Level: Set up assist    Lower Body Dressing/Undressing Lower body dressing      What is the patient wearing?: Pants,Underwear/pull up     Lower body assist Assist for lower body dressing: Supervision/Verbal cueing     Toileting Toileting Toileting Activity did not occur (Clothing management and hygiene only): N/A (no void or bm)  Toileting assist Assist for toileting: Supervision/Verbal cueing     Transfers Chair/bed transfer  Transfers assist     Chair/bed transfer assist level: Supervision/Verbal cueing     Locomotion Ambulation   Ambulation assist   Ambulation activity did not occur: Safety/medical concerns  Assist level: Contact Guard/Touching assist Assistive device: Walker-rolling Max distance: 75   Walk 10 feet activity   Assist  Walk 10 feet activity did not occur: Safety/medical concerns  Assist level: Contact Guard/Touching assist Assistive device: Walker-rolling   Walk 50 feet activity   Assist Walk 50 feet with 2 turns activity did not occur: Safety/medical concerns  Assist level: Contact Guard/Touching assist Assistive device: Walker-rolling    Walk 150 feet activity   Assist Walk 150 feet activity did not occur: Safety/medical concerns         Walk 10 feet on uneven surface  activity   Assist Walk 10  feet on uneven surfaces activity did not occur: Safety/medical concerns         Wheelchair     Assist Will patient use wheelchair at discharge?: Yes Type of Wheelchair: Manual    Wheelchair assist level: Independent Max wheelchair distance: 150    Wheelchair 50  feet with 2 turns activity    Assist        Assist Level: Independent   Wheelchair 150 feet activity     Assist      Assist Level: Independent     Medical Problem List and Plan: 1.  Impaired mobility and ADLs secondary to closed pelvic fracture.  Continue CIR 2.  Antithrombotics: -DVT/anticoagulation:  Pharmaceutical: Lovenox             -antiplatelet therapy: N/A 3. Pain Management: Continue Tramadol, oxycodone as needed  Relatively controlled with meds on 12/17  12/18- encouraged pt to take tramadol/oxy if has to- tylenol is not always enough at this point. 4. Mood: Team to order ego support. LCSW to follow for evaluation and support.              -antipsychotic agents: N/A 5. Neuropsych: This patient is capable of making decisions on her own behalf. 6. Skin/Wound Care: Routine pressure relief measure.  7. Fluids/Electrolytes/Nutrition: Monitor I/Os.  8. HTN: Monitor BP  Decrease lisinopril to 2.5mg .   Controlled with meds on 12/17  12/18- BP controlled- con't meds 9. Tachycardia: Monitor for symptoms with activity.   Controlled on 12/17  12/18- controlled- 86- con't regimen 10. Anxiety: Continue Celexa  12/18- slightly anxious about taking controlled meds, but we discussed it- feeling better 12. Slow transit constipation:   Changed senna to scheduled.  Bowel meds increased again on 12/13  Improved with meds  12/18- LBM yesterday- con't regimen 13. Urinary incontinence:   Resolved 14.  Acute blood loss anemia  Hemoglobin 10.8 on 12/17  Continue to monitor  LOS: 8 days A FACE TO FACE EVALUATION WAS PERFORMED  Leontae Bostock 08/11/2020, 5:41 PM

## 2020-08-11 NOTE — Plan of Care (Signed)
  Problem: Consults Goal: RH GENERAL PATIENT EDUCATION Description: See Patient Education module for education specifics. Outcome: Progressing Goal: Skin Care Protocol Initiated - if Braden Score 18 or less Description: If consults are not indicated, leave blank or document N/A Outcome: Progressing Goal: Nutrition Consult-if indicated Outcome: Progressing Goal: Diabetes Guidelines if Diabetic/Glucose > 140 Description: If diabetic or lab glucose is > 140 mg/dl - Initiate Diabetes/Hyperglycemia Guidelines & Document Interventions  Outcome: Progressing   Problem: RH BOWEL ELIMINATION Goal: RH STG MANAGE BOWEL WITH ASSISTANCE Description: STG Manage Bowel with Assistance. Outcome: Progressing Goal: RH STG MANAGE BOWEL W/MEDICATION W/ASSISTANCE Description: STG Manage Bowel with Medication with Assistance. Outcome: Progressing   Problem: RH BLADDER ELIMINATION Goal: RH STG MANAGE BLADDER WITH ASSISTANCE Description: STG Manage Bladder With Assistance Outcome: Progressing Goal: RH STG MANAGE BLADDER WITH MEDICATION WITH ASSISTANCE Description: STG Manage Bladder With Medication With Assistance. Outcome: Progressing Goal: RH STG MANAGE BLADDER WITH EQUIPMENT WITH ASSISTANCE Description: STG Manage Bladder With Equipment With Assistance Outcome: Progressing   Problem: RH SKIN INTEGRITY Goal: RH STG SKIN FREE OF INFECTION/BREAKDOWN Outcome: Progressing Goal: RH STG MAINTAIN SKIN INTEGRITY WITH ASSISTANCE Description: STG Maintain Skin Integrity With Assistance. Outcome: Progressing Goal: RH STG ABLE TO PERFORM INCISION/WOUND CARE W/ASSISTANCE Description: STG Able To Perform Incision/Wound Care With Assistance. Outcome: Progressing   Problem: RH SAFETY Goal: RH STG ADHERE TO SAFETY PRECAUTIONS W/ASSISTANCE/DEVICE Description: STG Adhere to Safety Precautions With Assistance/Device. Outcome: Progressing Goal: RH STG DECREASED RISK OF FALL WITH ASSISTANCE Description: STG  Decreased Risk of Fall With Assistance. Outcome: Progressing   Problem: RH PAIN MANAGEMENT Goal: RH STG PAIN MANAGED AT OR BELOW PT'S PAIN GOAL Outcome: Progressing   Problem: RH KNOWLEDGE DEFICIT GENERAL Goal: RH STG INCREASE KNOWLEDGE OF SELF CARE AFTER HOSPITALIZATION Outcome: Progressing   

## 2020-08-11 NOTE — Progress Notes (Signed)
Physical Therapy Session Note  Patient Details  Name: CHELCEY CAPUTO MRN: [id number] Date of Birth: 08/22/1951  Today's Date: 08/11/2020 PT Individual Time: 1594-7076 PT Individual Time Calculation (min): 45 min   Short Term Goals: Week 1:  PT Short Term Goal 1 (Week 1): STG=LTG due to ELOS  Skilled Therapeutic Interventions/Progress Updates:   Pt received supine in bed and agreeable to PT. Supine>sit transfer with supervision assist and increased time for LLE management. Stand pivot transfer to [facility] with supervision assist and min cues for AD management. WC mobility throughout hallway x 164f and only min cues for awareness of door frame and assist to position WC leg rests. Gait training with RW and supervision A [id number] distance supervision assist and min cues for AD management in turn to prepare to sit. No lob noted, but continues to have mild atalgic gait pattern. Dynamic standing balance/tolerance while engaged in [employer] bowling. Pt able to tolerate standing x 8 frames with 1-2 UE support on RW. Pt returned to room and performed stand pivot transfer to bed with RW and supervision assist. Sit>supine completed with supervision assist and increased time, as pt used BUE to manage the LLE. Pt  left supine in bed with call bell in reach and all needs met.        Therapy Documentation Precautions:  Precautions Precautions: Fall Restrictions Weight Bearing Restrictions: Yes RLE Weight Bearing: Weight bearing as tolerated LLE Weight Bearing: Weight bearing as tolerated Vital Signs: Therapy Vitals Temp: 98.4 F (36.9 C) Pulse Rate: 86 Resp: 16 BP: 116/78 Patient Position (if appropriate): Lying Oxygen Therapy SpO2: 99 % O2 Device: Room Air Pain: Pain Assessment Pain Scale: 0-10 Pain Score: 4  Pain Type: Acute pain Pain Location: Leg Pain Orientation: Left Pain Descriptors / Indicators: Throbbing Pain Frequency: Constant Pain Onset: Progressive Patients Stated  Pain Goal: 2 Pain Intervention(s): Medication (See eMAR) Multiple Pain Sites: No     Therapy/Group: Individual Therapy  AuLorie Phenix2/18/2021, 4:52 PM

## 2020-08-12 ENCOUNTER — Inpatient Hospital Stay (HOSPITAL_COMMUNITY): Payer: Medicare Other

## 2020-08-12 NOTE — Progress Notes (Signed)
[name] PHYSICAL MEDICINE & REHABILITATION PROGRESS NOTE   Subjective/Complaints:  Pt reports wants Moderna booster before leaves hospital- sister won't let her.   ROS:  Pt denies SOB, abd pain, CP, N/V/C/D, and vision changes  Objective:   No results found. Recent Labs    08/10/20 0523  WBC 4.7  HGB 10.8*  HCT 33.2*  PLT 236   No results for input(s): NA, K, CL, CO2, GLUCOSE, BUN, CREATININE, CALCIUM in the last 72 hours.  Intake/Output Summary (Last 24 hours) at 08/12/2020 1410 Last data filed at 08/12/2020 1341 Gross per 24 hour  Intake 440 ml  Output --  Net 440 ml        Physical Exam: Vital Signs Blood pressure 106/70, pulse 90, temperature 98 F (36.7 C), temperature source Oral, resp. rate 16, weight 73.4 kg, SpO2 97 %. Constitutional: sitting up in bed; RN in room, NAD HENT: Normocephalic.  Atraumatic. Eyes: EOMI. No discharge. Cardiovascular: tRRR  Respiratory: CTA B/L- no W/R/R- good air movement GI: Soft, NT, ND, (+)BS - hyperactive  Skin: Warm and dry.  Intact. No rash seen on skin Psych: appropriate, nervous Musc: Left hip with improving edema- still very TTP Neuro: Alert Motor: Bilateral upper extremities Right lower extremity: 4+/5 proximal to distal (pain inhibition) Left lower extremity: Hip flexion, knee extension 4--4/5 (pain inhibition), ankle dorsiflexion 4+/5, stable  Assessment/Plan: 1. Functional deficits which require 3+ hours per day of interdisciplinary therapy in a comprehensive inpatient rehab setting.  Physiatrist is providing close team supervision and 24 hour management of active medical problems listed below.  Physiatrist and rehab team continue to assess barriers to discharge/monitor patient progress toward functional and medical goals  Care Tool:  Bathing  Bathing activity did not occur: Refused (Pt declined) Body parts bathed by patient: Right arm,Left arm,Chest,Abdomen,Front perineal area,Buttocks,Right upper  leg,Left upper leg,Right lower leg,Face,Left lower leg   Body parts bathed by helper: Left lower leg     Bathing assist Assist Level: Supervision/Verbal cueing     Upper Body Dressing/Undressing Upper body dressing   What is the patient wearing?: Pull over shirt    Upper body assist Assist Level: Set up assist    Lower Body Dressing/Undressing Lower body dressing      What is the patient wearing?: Pants,Underwear/pull up     Lower body assist Assist for lower body dressing: Supervision/Verbal cueing     Toileting Toileting Toileting Activity did not occur (Clothing management and hygiene only): N/A (no void or bm)  Toileting assist Assist for toileting: Supervision/Verbal cueing     Transfers Chair/bed transfer  Transfers assist     Chair/bed transfer assist level: Supervision/Verbal cueing     Locomotion Ambulation   Ambulation assist   Ambulation activity did not occur: Safety/medical concerns  Assist level: Contact Guard/Touching assist Assistive device: Walker-rolling Max distance: 75   Walk 10 feet activity   Assist  Walk 10 feet activity did not occur: Safety/medical concerns  Assist level: Contact Guard/Touching assist Assistive device: Walker-rolling   Walk 50 feet activity   Assist Walk 50 feet with 2 turns activity did not occur: Safety/medical concerns  Assist level: Contact Guard/Touching assist Assistive device: Walker-rolling    Walk 150 feet activity   Assist Walk 150 feet activity did not occur: Safety/medical concerns         Walk 10 feet on uneven surface  activity   Assist Walk 10 feet on uneven surfaces activity did not occur: Safety/medical concerns  Wheelchair     Assist Will patient use wheelchair at discharge?: Yes Type of Wheelchair: Manual    Wheelchair assist level: Independent Max wheelchair distance: 150    Wheelchair 50 feet with 2 turns activity    Assist        Assist  Level: Independent   Wheelchair 150 feet activity     Assist      Assist Level: Independent     Medical Problem List and Plan: 1.  Impaired mobility and ADLs secondary to closed pelvic fracture.  Continue CIR 2.  Antithrombotics: -DVT/anticoagulation:  Pharmaceutical: Lovenox             -antiplatelet therapy: N/A 3. Pain Management: Continue Tramadol, oxycodone as needed  Relatively controlled with meds on 12/17  12/18- encouraged pt to take tramadol/oxy if has to- tylenol is not always enough at this point. 4. Mood: Team to order ego support. LCSW to follow for evaluation and support.              -antipsychotic agents: N/A 5. Neuropsych: This patient is capable of making decisions on her own behalf. 6. Skin/Wound Care: Routine pressure relief measure.  7. Fluids/Electrolytes/Nutrition: Monitor I/Os.  8. HTN: Monitor BP  Decrease lisinopril to 2.5mg .   Controlled with meds on 12/17  12/18- BP controlled- con't meds 9. Tachycardia: Monitor for symptoms with activity.   Controlled on 12/17  12/18- controlled- 86- con't regimen 10. Anxiety: Continue Celexa  12/18- slightly anxious about taking controlled meds, but we discussed it- feeling better 12. Slow transit constipation:   Changed senna to scheduled.  Bowel meds increased again on 12/13  Improved with meds  12/18- LBM yesterday- con't regimen 13. Urinary incontinence:   Resolved 14.  Acute blood loss anemia  Hemoglobin 10.8 on 12/17  Continue to monitor 15. Asked nurse to get pt Moderna booster before d/c- to place message to [place] booster team.    LOS: 9 days A FACE TO FACE EVALUATION WAS PERFORMED  [name] [name] 08/12/2020, 2:10 PM

## 2020-08-12 NOTE — Plan of Care (Signed)
  Problem: Consults Goal: RH GENERAL PATIENT EDUCATION Description: See Patient Education module for education specifics. Outcome: Progressing Goal: Skin Care Protocol Initiated - if Braden Score 18 or less Description: If consults are not indicated, leave blank or document N/A Outcome: Progressing Goal: Nutrition Consult-if indicated Outcome: Progressing Goal: Diabetes Guidelines if Diabetic/Glucose > 140 Description: If diabetic or lab glucose is > 140 mg/dl - Initiate Diabetes/Hyperglycemia Guidelines & Document Interventions  Outcome: Progressing   Problem: RH BOWEL ELIMINATION Goal: RH STG MANAGE BOWEL WITH ASSISTANCE Description: STG Manage Bowel with Assistance. Outcome: Progressing Goal: RH STG MANAGE BOWEL W/MEDICATION W/ASSISTANCE Description: STG Manage Bowel with Medication with Assistance. Outcome: Progressing   Problem: RH BLADDER ELIMINATION Goal: RH STG MANAGE BLADDER WITH ASSISTANCE Description: STG Manage Bladder With Assistance Outcome: Progressing Goal: RH STG MANAGE BLADDER WITH MEDICATION WITH ASSISTANCE Description: STG Manage Bladder With Medication With Assistance. Outcome: Progressing Goal: RH STG MANAGE BLADDER WITH EQUIPMENT WITH ASSISTANCE Description: STG Manage Bladder With Equipment With Assistance Outcome: Progressing   Problem: RH SKIN INTEGRITY Goal: RH STG SKIN FREE OF INFECTION/BREAKDOWN Outcome: Progressing Goal: RH STG MAINTAIN SKIN INTEGRITY WITH ASSISTANCE Description: STG Maintain Skin Integrity With Assistance. Outcome: Progressing Goal: RH STG ABLE TO PERFORM INCISION/WOUND CARE W/ASSISTANCE Description: STG Able To Perform Incision/Wound Care With Assistance. Outcome: Progressing   Problem: RH SAFETY Goal: RH STG ADHERE TO SAFETY PRECAUTIONS W/ASSISTANCE/DEVICE Description: STG Adhere to Safety Precautions With Assistance/Device. Outcome: Progressing Goal: RH STG DECREASED RISK OF FALL WITH ASSISTANCE Description: STG  Decreased Risk of Fall With Assistance. Outcome: Progressing   Problem: RH PAIN MANAGEMENT Goal: RH STG PAIN MANAGED AT OR BELOW PT'S PAIN GOAL Outcome: Progressing   Problem: RH KNOWLEDGE DEFICIT GENERAL Goal: RH STG INCREASE KNOWLEDGE OF SELF CARE AFTER HOSPITALIZATION Outcome: Progressing   

## 2020-08-12 NOTE — Plan of Care (Signed)
°  Problem: Consults Goal: RH GENERAL PATIENT EDUCATION Description: See Patient Education module for education specifics. 08/12/2020 1629 by Renda Rolls L, LPN Outcome: Progressing 08/12/2020 0855 by Sanda Linger, LPN Outcome: Progressing Goal: Skin Care Protocol Initiated - if Braden Score 18 or less Description: If consults are not indicated, leave blank or document N/A 08/12/2020 1629 by Renda Rolls L, LPN Outcome: Progressing 08/12/2020 0855 by Renda Rolls L, LPN Outcome: Progressing Goal: Nutrition Consult-if indicated 08/12/2020 1629 by Renda Rolls L, LPN Outcome: Progressing 08/12/2020 0855 by Sanda Linger, LPN Outcome: Progressing Goal: Diabetes Guidelines if Diabetic/Glucose > 140 Description: If diabetic or lab glucose is > 140 mg/dl - Initiate Diabetes/Hyperglycemia Guidelines & Document Interventions  08/12/2020 1629 by Renda Rolls L, LPN Outcome: Progressing 08/12/2020 0855 by Sanda Linger, LPN Outcome: Progressing   Problem: RH BOWEL ELIMINATION Goal: RH STG MANAGE BOWEL WITH ASSISTANCE Description: STG Manage Bowel with Assistance. 08/12/2020 1629 by Renda Rolls L, LPN Outcome: Progressing 08/12/2020 0855 by Sanda Linger, LPN Outcome: Progressing Goal: RH STG MANAGE BOWEL W/MEDICATION W/ASSISTANCE Description: STG Manage Bowel with Medication with Assistance. 08/12/2020 1629 by Renda Rolls L, LPN Outcome: Progressing 08/12/2020 0855 by Sanda Linger, LPN Outcome: Progressing   Problem: RH BLADDER ELIMINATION Goal: RH STG MANAGE BLADDER WITH ASSISTANCE Description: STG Manage Bladder With Assistance 08/12/2020 1629 by Renda Rolls L, LPN Outcome: Progressing 08/12/2020 0855 by Sanda Linger, LPN Outcome: Progressing Goal: RH STG MANAGE BLADDER WITH MEDICATION WITH ASSISTANCE Description: STG Manage Bladder With Medication With Assistance. 08/12/2020 1629 by Renda Rolls L, LPN Outcome: Progressing 08/12/2020 0855 by Sanda Linger,  LPN Outcome: Progressing Goal: RH STG MANAGE BLADDER WITH EQUIPMENT WITH ASSISTANCE Description: STG Manage Bladder With Equipment With Assistance 08/12/2020 1629 by Renda Rolls L, LPN Outcome: Progressing 08/12/2020 0855 by Renda Rolls L, LPN Outcome: Progressing   Problem: RH SKIN INTEGRITY Goal: RH STG SKIN FREE OF INFECTION/BREAKDOWN 08/12/2020 1629 by Renda Rolls L, LPN Outcome: Progressing 08/12/2020 0855 by Sanda Linger, LPN Outcome: Progressing Goal: RH STG MAINTAIN SKIN INTEGRITY WITH ASSISTANCE Description: STG Maintain Skin Integrity With Assistance. 08/12/2020 1629 by Renda Rolls L, LPN Outcome: Progressing 08/12/2020 0855 by Sanda Linger, LPN Outcome: Progressing Goal: RH STG ABLE TO PERFORM INCISION/WOUND CARE W/ASSISTANCE Description: STG Able To Perform Incision/Wound Care With Assistance. 08/12/2020 1629 by Renda Rolls L, LPN Outcome: Progressing 08/12/2020 0855 by Sanda Linger, LPN Outcome: Progressing   Problem: RH SAFETY Goal: RH STG ADHERE TO SAFETY PRECAUTIONS W/ASSISTANCE/DEVICE Description: STG Adhere to Safety Precautions With Assistance/Device. 08/12/2020 1629 by Renda Rolls L, LPN Outcome: Progressing 08/12/2020 0855 by Sanda Linger, LPN Outcome: Progressing Goal: RH STG DECREASED RISK OF FALL WITH ASSISTANCE Description: STG Decreased Risk of Fall With Assistance. 08/12/2020 1629 by Renda Rolls L, LPN Outcome: Progressing 08/12/2020 0855 by Renda Rolls L, LPN Outcome: Progressing   Problem: RH PAIN MANAGEMENT Goal: RH STG PAIN MANAGED AT OR BELOW PT'S PAIN GOAL 08/12/2020 1629 by Renda Rolls L, LPN Outcome: Progressing 08/12/2020 0855 by Renda Rolls L, LPN Outcome: Progressing   Problem: RH KNOWLEDGE DEFICIT GENERAL Goal: RH STG INCREASE KNOWLEDGE OF SELF CARE AFTER HOSPITALIZATION 08/12/2020 1629 by Renda Rolls L, LPN Outcome: Progressing 08/12/2020 0855 by Sanda Linger, LPN Outcome: Progressing

## 2020-08-13 ENCOUNTER — Inpatient Hospital Stay (HOSPITAL_COMMUNITY): Payer: Medicare Other | Admitting: Occupational Therapy

## 2020-08-13 ENCOUNTER — Inpatient Hospital Stay (HOSPITAL_COMMUNITY): Payer: Medicare Other | Admitting: Physical Therapy

## 2020-08-13 ENCOUNTER — Encounter (HOSPITAL_COMMUNITY): Payer: Self-pay | Admitting: Physical Medicine & Rehabilitation

## 2020-08-13 ENCOUNTER — Other Ambulatory Visit: Payer: Self-pay

## 2020-08-13 DIAGNOSIS — R739 Hyperglycemia, unspecified: Secondary | ICD-10-CM

## 2020-08-13 LAB — CBC
HCT: 34.6 % — ABNORMAL LOW (ref 36.0–46.0)
Hemoglobin: 11 g/dL — ABNORMAL LOW (ref 12.0–15.0)
MCH: 30.5 pg (ref 26.0–34.0)
MCHC: 31.8 g/dL (ref 30.0–36.0)
MCV: 95.8 fL (ref 80.0–100.0)
Platelets: 263 10*3/uL (ref 150–400)
RBC: 3.61 MIL/uL — ABNORMAL LOW (ref 3.87–5.11)
RDW: 11.5 % (ref 11.5–15.5)
WBC: 4.9 10*3/uL (ref 4.0–10.5)
nRBC: 0 % (ref 0.0–0.2)

## 2020-08-13 LAB — BASIC METABOLIC PANEL
Anion gap: 9 (ref 5–15)
BUN: 8 mg/dL (ref 8–23)
CO2: 25 mmol/L (ref 22–32)
Calcium: 9.2 mg/dL (ref 8.9–10.3)
Chloride: 104 mmol/L (ref 98–111)
Creatinine, Ser: 0.89 mg/dL (ref 0.44–1.00)
GFR, Estimated: >60 mL/min (ref >60)
Glucose, Bld: 103 mg/dL — ABNORMAL HIGH (ref 70–99)
Potassium: 4.8 mmol/L (ref 3.5–5.1)
Sodium: 138 mmol/L (ref 135–145)

## 2020-08-13 MED ORDER — POLYETHYLENE GLYCOL 3350 17 G PO PACK: 17.0000 g | PACK | Freq: Two times a day (BID) | ORAL | 0 refills | Status: DC

## 2020-08-13 MED ORDER — SENNOSIDES-DOCUSATE SODIUM 8.6-50 MG PO TABS: 2.0000 | ORAL_TABLET | Freq: Every day | ORAL | Status: DC

## 2020-08-13 MED ORDER — OXYCODONE HCL 5 MG PO TABS
5.0000 mg | ORAL_TABLET | Freq: Two times a day (BID) | ORAL | Status: DC | PRN
Start: 2020-08-13 — End: 2020-08-14

## 2020-08-13 NOTE — Progress Notes (Signed)
At 1830 vitals taken due to day shift not knowing if we would have a tech for night shift. RN will continue to monitor.

## 2020-08-13 NOTE — Progress Notes (Addendum)
Occupational Therapy Discharge Summary  Patient Details  Name: [name] [name] MRN: [id number] Date of Birth: August 02, 1951  Today's Date: 08/13/2020 OT Individual Time: 1335-1435 OT Individual Time Calculation (min): 60 min    Patient has met 6 of 7 long term goals due to improved activity tolerance, improved balance and ability to compensate for deficits.  Patient to discharge at overall mod I to Supervision level.  Patient's care partner is independent to provide the necessary cognitive assistance at discharge.    Reasons goals not met: Pt requires supervision for occasional VCs for safety awareness during shower transfer due to 2 inch threshold to step over at sisters house.  Recommendation:  Patient will not require skilled OT follow up at this time.  Equipment: BSC, RW, and shower chair purchased from OT recommendation  Reasons for discharge: treatment goals met and discharge from hospital   Skilled Intervention: Pt sitting up in w/c, c/o 5/10 pain in LLE but agreeable to OT session and requesting to use bathroom.  Pt ambulated to bathroom and completed toilet transfer with mod I using RW.  Pt completed toileting and had continent episode of urine with mod I.  Pt ambulated using RW to large gym with one seated RB required due to pain in LLE with supervision with occasional VCs for improved posture and RW mgt. Pt completed simulation of 2 inch threshold walk in shower transfer with supervision including sit<>stand at shower chair.  Pt provided RW bag and reacher bag and trained on use to retrieve items using reacher in standing with mod I.  Pt ambulated approx 50 feet using RW with supervision then requiring w/c transport back to room due to pain in LLE.  Pt completed stand pivot to EOB with supervision and sit to supine with mod I.  Call bell in reach, bed alarm on.    Patient/family agrees with progress made and goals achieved: Yes  OT Discharge Precautions/Restrictions   Precautions Precautions: Fall Restrictions Weight Bearing Restrictions: No Pain Pain Assessment Pain Scale: 0-10 Pain Score: 5  Pain Type: Acute pain Pain Location: Leg Pain Orientation: Left Pain Descriptors / Indicators: Sore Pain Intervention(s): Rest Multiple Pain Sites: No ADL ADL Eating: Independent Where Assessed-Eating: Chair Grooming: Modified independent Where Assessed-Grooming: Standing at sink Upper Body Bathing: Modified independent Where Assessed-Upper Body Bathing: Shower Lower Body Bathing: Modified independent Where Assessed-Lower Body Bathing: Shower Upper Body Dressing: Modified independent (Device) Where Assessed-Upper Body Dressing: Edge of bed Lower Body Dressing: Modified independent Where Assessed-Lower Body Dressing: Chair Toileting: Modified independent Where Assessed-Toileting: Toilet (3 in 1 commode with padded seat over toilet) Toilet Transfer: Modified independent Toilet Transfer Method: Ambulating Tub/Shower Transfer: Close supervison (walk in shower with 2 inch threshold) Tub/Shower Transfer Method: Ambulating Vision Baseline Vision/History: Wears glasses Wears Glasses: At all times Patient Visual Report: No change from baseline Vision Assessment?: No apparent visual deficits Perception  Perception: Within Functional Limits Praxis Praxis: Intact Cognition Overall Cognitive Status: Within Functional Limits for tasks assessed Arousal/Alertness: Awake/alert Orientation Level: Oriented X4 Focused Attention: Appears intact Alternating Attention: Appears intact Memory: Appears intact Reasoning: Appears intact Sequencing: Appears intact Safety/Judgment: Appears intact Sensation Sensation Light Touch: Appears Intact Hot/Cold: Appears Intact Proprioception: Appears Intact Stereognosis: Not tested Coordination Gross Motor Movements are Fluid and Coordinated: Yes Fine Motor Movements are Fluid and Coordinated: Yes Coordination and  Movement Description: antalgic movements but no coordnation deficits noted Finger Nose Finger Test: [facility] Motor  Motor Motor: Within Functional Limits Mobility  Transfers Sit to Stand: Independent  with assistive device Stand to Sit: Independent with assistive device  Trunk/Postural Assessment  Cervical Assessment Cervical Assessment: Within Functional Limits Thoracic Assessment Thoracic Assessment: Within Functional Limits Lumbar Assessment Lumbar Assessment: Within Functional Limits Postural Control Postural Control: Within Functional Limits  Balance Balance Balance Assessed: Yes Static Sitting Balance Static Sitting - Balance Support: Feet supported Static Sitting - Level of Assistance: 7: Independent Dynamic Sitting Balance Dynamic Sitting - Balance Support: Feet supported Dynamic Sitting - Level of Assistance: 7: Independent Static Standing Balance Static Standing - Balance Support: Bilateral upper extremity supported Static Standing - Level of Assistance: 6: Modified independent (Device/Increase time) Dynamic Standing Balance Dynamic Standing - Balance Support: Bilateral upper extremity supported Dynamic Standing - Level of Assistance: 5: Stand by assistance Extremity/Trunk Assessment RUE Assessment RUE Assessment: Within Functional Limits LUE Assessment LUE Assessment: Within Functional Limits   [name] [name] L [name] [name] 08/13/2020, 4:02 PM

## 2020-08-13 NOTE — Progress Notes (Signed)
[facility] PHYSICAL MEDICINE & REHABILITATION PROGRESS NOTE   Subjective/Complaints: Patient seen sitting up in her chair, about to work with therapy this morning.  She states she slept well overnight.  She states that a good weekend.  She has questions regarding discharge medications and handicap placard.  She also requests Covid booster prior to discharge.  ROS: Denies CP, SOB, N/V/D  Objective:   No results found. Recent Labs    08/13/20 0729  WBC 4.9  HGB 11.0*  HCT 34.6*  PLT 263   Recent Labs    08/13/20 0729  NA 138  K 4.8  CL 104  CO2 25  GLUCOSE 103*  BUN 8  CREATININE 0.89  CALCIUM 9.2    Intake/Output Summary (Last 24 hours) at 08/13/2020 1331 Last data filed at 08/13/2020 0857 Gross per 24 hour  Intake 700 ml  Output --  Net 700 ml        Physical Exam: Vital Signs Blood pressure 105/67, pulse 88, temperature 98.6 F (37 C), resp. rate 17, height 5\' 3"  (1.6 m), weight 73.4 kg, SpO2 99 %. Constitutional: No distress . Vital signs reviewed. HENT: Normocephalic.  Atraumatic. Eyes: EOMI. No discharge. Cardiovascular: No JVD.  RRR. Respiratory: Normal effort.  No stridor.  Bilateral clear to auscultation. GI: Non-distended.  BS +. Skin: Warm and dry.  Intact. Psych: Normal mood.  Normal behavior. Musc: Left hip with improving edema and tenderness. Neuro: Alert Motor: Bilateral upper extremities Right lower extremity: 4+/5 proximal to distal (pain inhibition) Left lower extremity: Hip flexion, knee extension 4--4/5 (pain inhibition), ankle dorsiflexion 4+/5, unchanged  Assessment/Plan: 1. Functional deficits which require 3+ hours per day of interdisciplinary therapy in a comprehensive inpatient rehab setting.  Physiatrist is providing close team supervision and 24 hour management of active medical problems listed below.  Physiatrist and rehab team continue to assess barriers to discharge/monitor patient progress toward functional and medical  goals  Care Tool:  Bathing  Bathing activity did not occur: Refused (Pt declined) Body parts bathed by patient: Right arm,Left arm,Chest,Abdomen,Front perineal area,Buttocks,Right upper leg,Left upper leg,Right lower leg,Face,Left lower leg   Body parts bathed by helper: Left lower leg     Bathing assist Assist Level: Supervision/Verbal cueing     Upper Body Dressing/Undressing Upper body dressing   What is the patient wearing?: Pull over shirt    Upper body assist Assist Level: Set up assist    Lower Body Dressing/Undressing Lower body dressing      What is the patient wearing?: Pants     Lower body assist Assist for lower body dressing: Independent with assitive device     Toileting Toileting Toileting Activity did not occur (Clothing management and hygiene only): N/A (no void or bm)  Toileting assist Assist for toileting: Supervision/Verbal cueing     Transfers Chair/bed transfer  Transfers assist     Chair/bed transfer assist level: Supervision/Verbal cueing     Locomotion Ambulation   Ambulation assist   Ambulation activity did not occur: Safety/medical concerns  Assist level: Contact Guard/Touching assist Assistive device: Walker-rolling Max distance: 150   Walk 10 feet activity   Assist  Walk 10 feet activity did not occur: Safety/medical concerns  Assist level: Independent with assistive device Assistive device: Walker-rolling   Walk 50 feet activity   Assist Walk 50 feet with 2 turns activity did not occur: Safety/medical concerns  Assist level: Contact Guard/Touching assist Assistive device: Walker-rolling    Walk 150 feet activity   Assist Walk  150 feet activity did not occur: Safety/medical concerns  Assist level: Contact Guard/Touching assist Assistive device: Walker-rolling    Walk 10 feet on uneven surface  activity   Assist Walk 10 feet on uneven surfaces activity did not occur: Safety/medical concerns          Wheelchair     Assist Will patient use wheelchair at discharge?: No Type of Wheelchair: Manual    Wheelchair assist level: Independent Max wheelchair distance: 150    Wheelchair 50 feet with 2 turns activity    Assist        Assist Level: Independent   Wheelchair 150 feet activity     Assist      Assist Level: Independent     Medical Problem List and Plan: 1.  Impaired mobility and ADLs secondary to closed pelvic fracture.  Continue CIR  Covid booster vaccine ordered 2.  Antithrombotics: -DVT/anticoagulation:  Pharmaceutical: Lovenox             -antiplatelet therapy: N/A 3. Pain Management: Continue Tramadol, oxycodone as needed  Relatively controlled with meds on 12/20 4. Mood: Team to order ego support. LCSW to follow for evaluation and support.              -antipsychotic agents: N/A 5. Neuropsych: This patient is capable of making decisions on her own behalf. 6. Skin/Wound Care: Routine pressure relief measure.  7. Fluids/Electrolytes/Nutrition: Monitor I/Os.  8. HTN: Monitor BP  Decrease lisinopril to 2.5mg .   Controlled with meds on 12/20 9. Tachycardia: Monitor for symptoms with activity.   Controlled on 12/20 10. Anxiety: Continue Celexa  12/18- slightly anxious about taking controlled meds, but we discussed it- feeling better 12. Slow transit constipation:   Changed senna to scheduled.  Bowel meds increased again on 12/13  Now regular with meds on 12/20 13. Urinary incontinence:   Resolved 14.  Acute blood loss anemia  Hemoglobin 11.0 on 12/20  Continue to monitor 15. Asked nurse to get pt Moderna booster before d/c- to place message to COVID booster team.  16.  Mild hyperglycemia  Hemoglobin A1c borderline prediabetic-continue to monitor ambulatory setting  LOS: 10 days A FACE TO FACE EVALUATION WAS PERFORMED  Collins Kerby Lorie Phenix 08/13/2020, 1:31 PM

## 2020-08-13 NOTE — Progress Notes (Signed)
Pt expressed to primary RN and charge nurse that one of the techs that answered the call light hung up on pt. Pt stated," Those people that answer the call lights are rude. They hung up on me twice after I told them I needed someone to cut off my light." RN acknowledged the pt's feelings and explained that she would educate the person that hung up on pt. Pt in no acute distress. RN will continue to monitor.

## 2020-08-13 NOTE — Progress Notes (Signed)
Physical Therapy Discharge Summary  Patient Details  Name: [name] [name] MRN: [id number] Date of Birth: 1951-01-07  Today's Date: 08/13/2020 PT Individual Time: 1000-1100 PT Individual Time Calculation (min): 60 min    Patient has met 3 of 8 long term goals due to improved activity tolerance, improved balance, improved postural control, increased strength, increased range of motion, decreased pain, functional use of  left lower extremity and improved coordination.  Patient to discharge at an ambulatory level Supervision.   Patient's care partner is independent to provide the necessary physical assistance at discharge.  Reasons goals not met: pt continues to require CGA-supervision for standing mobility including gait and transfers limiting her from goals set at mod I level with use of Rolling walker but has greatly improved overall.   Recommendation:  Patient will benefit from ongoing skilled PT services in home health setting to continue to advance safe functional mobility, address ongoing impairments in gait training, standing balance, overall strength and tolerance to activity and minimize fall risk.  Equipment: rolling walker, BSC  Reasons for discharge: discharge from hospital  Patient/family agrees with progress made and goals achieved: Yes  PT Discharge Precautions/Restrictions Precautions Precautions: Fall Restrictions Weight Bearing Restrictions: No RLE Weight Bearing: Weight bearing as tolerated LLE Weight Bearing: Weight bearing as tolerated Vital Signs   Pain Pain Assessment Pain Scale: 0-10 Pain Score: 3  Pain Type: Acute pain Pain Location: Leg Pain Orientation: Left Pain Descriptors / Indicators: Aching;Sore Pain Onset: On-going Vision/Perception  Perception Perception: Within Functional Limits Praxis Praxis: Intact  Cognition Overall Cognitive Status: Within Functional Limits for tasks assessed Arousal/Alertness: Awake/alert Orientation  Level: Oriented X4 Attention: Alternating Focused Attention: Appears intact Awareness: Appears intact Problem Solving: Appears intact Executive Function: Reasoning;Sequencing Reasoning: Appears intact Sequencing: Appears intact Safety/Judgment: Appears intact Sensation Sensation Light Touch: Appears Intact Coordination Gross Motor Movements are Fluid and Coordinated: Yes Fine Motor Movements are Fluid and Coordinated: Yes Finger Nose Finger Test: [facility] Motor  Motor Motor - Skilled Clinical Observations: motor movements limited by pain  Mobility Bed Mobility Bed Mobility: Supine to Sit;Sit to Supine Locomotion  Gait Ambulation: Yes Gait Assistance: Contact Guard/Touching assist Gait Distance (Feet): 150 Feet Assistive device: Rolling walker Gait Assistance Details: Verbal cues for precautions/safety;Verbal cues for safe use of DME/AE;Verbal cues for gait pattern;Verbal cues for technique Gait Gait: Yes Gait Pattern: Impaired Gait Pattern: Step-to pattern;Antalgic;Lateral hip instability;Right hip hike;Left steppage;Trunk flexed Gait velocity: Decreased Stairs / Additional Locomotion Stairs: No Wheelchair Mobility Wheelchair Mobility: Yes Wheelchair Assistance: Independent with Camera operator: Both upper extremities Distance: 150  Trunk/Postural Assessment  Cervical Assessment Cervical Assessment: Within Functional Limits Thoracic Assessment Thoracic Assessment: Within Functional Limits Lumbar Assessment Lumbar Assessment: Within Functional Limits Postural Control Postural Control: Within Functional Limits  Balance Balance Balance Assessed: Yes Static Sitting Balance Static Sitting - Level of Assistance: 6: Modified independent (Device/Increase time) Dynamic Sitting Balance Dynamic Sitting - Balance Support: Feet supported;During functional activity Dynamic Sitting - Level of Assistance: 6: Modified independent (Device/Increase  time) Dynamic Sitting - Balance Activities: Lateral lean/weight shifting;Forward lean/weight shifting;Reaching for objects Static Standing Balance Static Standing - Balance Support: Bilateral upper extremity supported Static Standing - Level of Assistance: 5: Stand by assistance Dynamic Standing Balance Dynamic Standing - Balance Support: During functional activity;Bilateral upper extremity supported Dynamic Standing - Level of Assistance: 5: Stand by assistance Extremity Assessment      RLE Assessment RLE Assessment: Within Functional Limits General Strength Comments: grossly 5/5 LLE Assessment LLE Assessment: Exceptions to  [facility] General Strength Comments: grossly 4+/5 with pain limiting in functional mobility    Junie Panning 08/13/2020, 12:15 PM

## 2020-08-13 NOTE — Progress Notes (Signed)
Occupational Therapy Session Note  Patient Details  Name: [name] [name] MRN: [id number] Date of Birth: [birth year]  Today's Date: 08/13/2020 OT Individual Time: 0758-0900 OT Individual Time Calculation (min): 62 min     Skilled Therapeutic Interventions/Progress Updates:    Patient in bed, alert and ready for therapy session.  She notes stiffness, nursing provided oral pain meds at start of session.  Supine to sitting edge of bed with CS.  She is able to ambulate with RW to/from bed, toilet, arm chair and w/c with CS.  toileting completed CS/mod I.  She is able to get clothing out of cabinets with CS and cues for hand placement.  LB dressing mod I seated at edge of bed.  She completed grooming tasks and oral care in stance at sink mod I.  Reviewed and practiced reach and transport of items in kitchen environment - overall good carryover, min cues and CS.  She was able to propel w/c at slow rate and walked 60 feet with CS. She remained seated in w/c at close of session.  She states that she is pleased with progress and ready for discharge to home tomorrow.  Call bell and tray table in reach.    Therapy Documentation Precautions:  Precautions Precautions: Fall Restrictions Weight Bearing Restrictions: No RLE Weight Bearing: Weight bearing as tolerated LLE Weight Bearing: Weight bearing as tolerated  Therapy/Group: Individual Therapy  Carlos Levering 08/13/2020, 7:52 AM

## 2020-08-13 NOTE — Progress Notes (Signed)
Inpatient Rehabilitation Care Coordinator  Discharge Note  The overall goal for the admission was met for:   Discharge location: Yes-home with sister for a short time until she is able to return back to her home.  Length of Stay: Yes-10 days  Discharge activity level: Yes-mod/i level  Home/community participation: Yes  Services provided included: MD, RD, PT, OT, RN, CM, Pharmacy and SW  Financial Services: Medicare and Private Insurance: [employer]  Follow-up services arranged: Home Health: [facility] health-pt & ot and Patient/Family request agency HH: referral made on acute, DME: no needs  Comments (or additional information):Going to sister's home until she is able to return to her own and her dog is also at sister's being cared for now.  Patient/Family verbalized understanding of follow-up arrangements: Yes  Individual responsible for coordination of the follow-up plan: self [phone]  Confirmed correct DME delivered: Elease Hashimoto 08/13/2020    Powell Halbert, Gardiner Rhyme

## 2020-08-13 NOTE — Progress Notes (Signed)
Physical Therapy Session Note  Patient Details  Name: [name] [name] MRN: [id number] Date of Birth: Apr 25, 1951  Today's Date: 08/13/2020 PT Individual Time: 1000-1100 PT Individual Time Calculation (min): 60 min   Short Term Goals: Week 1:  PT Short Term Goal 1 (Week 1): STG=LTG due to ELOS  Skilled Therapeutic Interventions/Progress Updates:    pt received in [facility] and agreeable to therapy. Pt reported soreness in LLE 3/10 pain overall but reported "I feel pretty good." pt taken to gym total A for time and energy, then directed in gait training 10' to car simulation transfer with Rolling walker supervision then supervision for car transfer into/out of car. Pt reported feeling much more comfortable with this and knowing she can walk to the car instead of going to it in a WC. Pt then directed in gait training for 150' supervision for first 39' but CGA for final 50' 2/2 fatigue however pt reported she will not need to walk this length at her sister's home. Pt is very motivated to continue to progress post DC and had multiple questions on progressing with HHPT, all answered and pt reported feeling comfortable DC at current level. Pt directed in bed mobility supine<>sit at mat table at mod I, no external cues or assistance required. x5 Sit to stand to Rolling walker supervision. Pt directed in NuStep 8 mins L3, no pain reported, no rest breaks taken and average 30 at speed. Pt required rest break post this activity and agreeable to gait train once recovered. Pt directed in Sit to stand supervision to Rolling walker then gait training for 100' supervision, one VC to step into walker more for improved walker proximity and safety. Pt requested to remain in [facility] at end of session. Alarm belt set, All needs in reach and in good condition. Call light in hand.    Therapy Documentation Precautions:  Precautions Precautions: Fall Restrictions Weight Bearing Restrictions: No RLE Weight Bearing: Weight  bearing as tolerated LLE Weight Bearing: Weight bearing as tolerated General:   Vital Signs:   Pain: Pain Assessment Pain Scale: 0-10 Pain Score: 3  Pain Type: Acute pain Pain Location: Leg Pain Orientation: Left Pain Descriptors / Indicators: Aching;Sore Pain Onset: On-going Mobility: Bed Mobility Bed Mobility: Supine to Sit;Sit to Supine Locomotion : Gait Ambulation: Yes Gait Assistance: Contact Guard/Touching assist Gait Distance (Feet): 150 Feet Assistive device: Rolling walker Gait Assistance Details: Verbal cues for precautions/safety;Verbal cues for safe use of DME/AE;Verbal cues for gait pattern;Verbal cues for technique Gait Gait: Yes Gait Pattern: Impaired Gait Pattern: Step-to pattern;Antalgic;Lateral hip instability;Right hip hike;Left steppage;Trunk flexed Gait velocity: Decreased Stairs / Additional Locomotion Stairs: No Architect: Yes Wheelchair Assistance: Independent with Camera operator: Both upper extremities Distance: 150  Trunk/Postural Assessment : Cervical Assessment Cervical Assessment: Within Functional Limits Thoracic Assessment Thoracic Assessment: Within Functional Limits Lumbar Assessment Lumbar Assessment: Within Functional Limits Postural Control Postural Control: Within Functional Limits  Balance: Balance Balance Assessed: Yes Static Sitting Balance Static Sitting - Level of Assistance: 6: Modified independent (Device/Increase time) Dynamic Sitting Balance Dynamic Sitting - Balance Support: Feet supported;During functional activity Dynamic Sitting - Level of Assistance: 6: Modified independent (Device/Increase time) Dynamic Sitting - Balance Activities: Lateral lean/weight shifting;Forward lean/weight shifting;Reaching for objects Static Standing Balance Static Standing - Balance Support: Bilateral upper extremity supported Static Standing - Level of Assistance: 5: Stand by  assistance Dynamic Standing Balance Dynamic Standing - Balance Support: During functional activity;Bilateral upper extremity supported Dynamic Standing - Level of  Assistance: 5: Stand by assistance Exercises:   Other Treatments:      Therapy/Group: Individual Therapy  Junie Panning 08/13/2020, 12:15 PM

## 2020-08-14 DIAGNOSIS — I10 Essential (primary) hypertension: Secondary | ICD-10-CM

## 2020-08-14 MED ORDER — OXYCODONE HCL 5 MG PO TABS: 5.0000 mg | ORAL_TABLET | Freq: Two times a day (BID) | ORAL | 0 refills | Status: DC | PRN

## 2020-08-14 MED ORDER — TRAZODONE HCL 50 MG PO TABS: 25.0000 mg | ORAL_TABLET | Freq: Every evening | ORAL | 0 refills | Status: DC | PRN

## 2020-08-14 MED ORDER — LISINOPRIL 2.5 MG PO TABS: 2.5000 mg | ORAL_TABLET | Freq: Every day | ORAL | 0 refills | Status: DC

## 2020-08-14 MED ORDER — TRAMADOL HCL 50 MG PO TABS: 50.0000 mg | ORAL_TABLET | Freq: Four times a day (QID) | ORAL | 0 refills | Status: DC | PRN

## 2020-08-14 MED ORDER — SENNOSIDES-DOCUSATE SODIUM 8.6-50 MG PO TABS: 2.0000 | ORAL_TABLET | Freq: Every day | ORAL | 0 refills | Status: DC

## 2020-08-14 NOTE — Progress Notes (Signed)
[place] PHYSICAL MEDICINE & REHABILITATION PROGRESS NOTE   Subjective/Complaints: Patient seen sitting up in bed this AM.  She states she slept well overnight.  She is excited about going home.  She has questions regarding Covid booster.  She reviewed all of her medications with me.   ROS: Denies CP, SOB, N/V/D  Objective:   No results found. Recent Labs    08/13/20 0729  WBC 4.9  HGB 11.0*  HCT 34.6*  PLT 263   Recent Labs    08/13/20 0729  NA 138  K 4.8  CL 104  CO2 25  GLUCOSE 103*  BUN 8  CREATININE 0.89  CALCIUM 9.2    Intake/Output Summary (Last 24 hours) at 08/14/2020 2153 Last data filed at 08/14/2020 0830 Gross per 24 hour  Intake 360 ml  Output --  Net 360 ml        Physical Exam: Vital Signs Blood pressure 131/73, pulse 91, temperature 98.3 F (36.8 C), temperature source Oral, resp. rate 18, height 5\' 3"  (1.6 m), weight 73.4 kg, SpO2 99 %.  Constitutional: No distress . Vital signs reviewed. HENT: Normocephalic.  Atraumatic. Eyes: EOMI. No discharge. Cardiovascular: No JVD.  RRR. Respiratory: Normal effort.  No stridor.  Bilateral clear to auscultation. GI: Non-distended.  BS +. Skin: Warm and dry.  Intact. Psych: Normal mood.  Normal behavior. Musc: Left hip with improving edema and tenderness. Neuro: Alert Motor: Bilateral upper extremities Right lower extremity: 4+/5 proximal to distal (pain inhibition) Left lower extremity: Hip flexion, knee extension 4--4/5 (pain inhibition), ankle dorsiflexion 4+/5, Improving  Assessment/Plan: 1. Functional deficits which require 3+ hours per day of interdisciplinary therapy in a comprehensive inpatient rehab setting.  Physiatrist is providing close team supervision and 24 hour management of active medical problems listed below.  Physiatrist and rehab team continue to assess barriers to discharge/monitor patient progress toward functional and medical goals  Care Tool:  Bathing  Bathing  activity did not occur: Refused (Pt declined) Body parts bathed by patient: Right arm,Left arm,Chest,Abdomen,Front perineal area,Buttocks,Right upper leg,Left upper leg,Right lower leg,Face,Left lower leg   Body parts bathed by helper: Left lower leg     Bathing assist Assist Level: Independent with assistive device     Upper Body Dressing/Undressing Upper body dressing   What is the patient wearing?: Pull over shirt    Upper body assist Assist Level: Independent with assistive device    Lower Body Dressing/Undressing Lower body dressing      What is the patient wearing?: Pants     Lower body assist Assist for lower body dressing: Independent with assitive device     Toileting Toileting Toileting Activity did not occur (Clothing management and hygiene only): N/A (no void or bm)  Toileting assist Assist for toileting: Independent with assistive device     Transfers Chair/bed transfer  Transfers assist     Chair/bed transfer assist level: Supervision/Verbal cueing     Locomotion Ambulation   Ambulation assist   Ambulation activity did not occur: Safety/medical concerns  Assist level: Contact Guard/Touching assist Assistive device: Walker-rolling Max distance: 150   Walk 10 feet activity   Assist  Walk 10 feet activity did not occur: Safety/medical concerns  Assist level: Independent with assistive device Assistive device: Walker-rolling   Walk 50 feet activity   Assist Walk 50 feet with 2 turns activity did not occur: Safety/medical concerns  Assist level: Contact Guard/Touching assist Assistive device: Walker-rolling    Walk 150 feet activity   Assist  Walk 150 feet activity did not occur: Safety/medical concerns  Assist level: Contact Guard/Touching assist Assistive device: Walker-rolling    Walk 10 feet on uneven surface  activity   Assist Walk 10 feet on uneven surfaces activity did not occur: Safety/medical concerns          Wheelchair     Assist Will patient use wheelchair at discharge?: No Type of Wheelchair: Manual    Wheelchair assist level: Independent Max wheelchair distance: 150    Wheelchair 50 feet with 2 turns activity    Assist        Assist Level: Independent   Wheelchair 150 feet activity     Assist      Assist Level: Independent     Medical Problem List and Plan: 1.  Impaired mobility and ADLs secondary to closed pelvic fracture.  DC today  Will see patient for transitional care management in 1-2 weeks post-discharge  Covid booster vaccine ordered 2.  Antithrombotics: -DVT/anticoagulation:  Pharmaceutical: Lovenox             -antiplatelet therapy: N/A 3. Pain Management: Continue Tramadol, oxycodone as needed  Controlled with meds on 12/21 4. Mood: Team to order ego support. LCSW to follow for evaluation and support.              -antipsychotic agents: N/A 5. Neuropsych: This patient is capable of making decisions on her own behalf. 6. Skin/Wound Care: Routine pressure relief measure.  7. Fluids/Electrolytes/Nutrition: Monitor I/Os.  8. HTN: Monitor BP  Decrease lisinopril to 2.5mg .   Controlled with meds on 12/21 9. Tachycardia: Monitor for symptoms with activity.   Controlled on 12/21 10. Anxiety: Continue Celexa  12/18- slightly anxious about taking controlled meds, but we discussed it- feeling better 12. Slow transit constipation:   Changed senna to scheduled.  Bowel meds increased again on 12/13  Now regular with meds on 12/21 13. Urinary incontinence:   Resolved 14.  Acute blood loss anemia  Hemoglobin 11.0 on 12/20  Continue to monitor 15. Asked nurse to get pt Moderna booster before d/c- to place message to [place] booster team.  16.  Mild hyperglycemia  Hemoglobin A1c borderline prediabetic-continue to monitor ambulatory setting  > 30 minutes spent in total in discharge planning between myself and PA regarding aforementioned, as well  discussion regarding DME equipment, follow-up appointments, follow-up therapies, discharge medications, discharge recommendations  LOS: 11 days A FACE TO FACE EVALUATION WAS PERFORMED  Jerret Mcbane Lorie Phenix 08/14/2020, 9:53 PM

## 2020-08-14 NOTE — Discharge Instructions (Signed)
Inpatient Rehab Discharge Instructions  [name] [name] Discharge date and time:  08/13/20  Activities/Precautions/ Functional Status: Activity: no lifting, driving, or strenuous exercise till cleared by MD Diet: regular diet Wound Care: none needed    Functional status:  ___ No restrictions     ___ Walk up steps independently ___ 24/7 supervision/assistance   ___ Walk up steps with assistance _X__ Intermittent supervision/assistance  ___ Bathe/dress independently ___ Walk with walker     ___ Bathe/dress with assistance ___ Walk Independently    ___ Shower independently ___ Walk with assistance    _X__ Shower with assistance _X__ No alcohol     ___ Return to work/school ________   Special Instructions:     COMMUNITY REFERRALS UPON DISCHARGE:    Home Health:   PT & OT                 [place] Phone:[phone]   Medical Equipment/Items Ordered:HAS ALL NEEDED EQUIPMENT FROM PAST ADMISSIONS                                                     My questions have been answered and I understand these instructions. I will adhere to these goals and the provided educational materials after my discharge from the hospital.  Patient/Caregiver Signature _______________________________ Date __________  Clinician Signature _______________________________________ Date __________  Please bring this form and your medication list with you to all your follow-up doctor's appointments.

## 2020-08-14 NOTE — Progress Notes (Signed)
Patient discharged to home with sister at 26. Patient given all discharge instructions via Reesa Chew PA.

## 2020-08-15 ENCOUNTER — Telehealth: Payer: Self-pay

## 2020-08-15 NOTE — Telephone Encounter (Signed)
TCM call placed to patient. She declined hospital follow up visit. She says she is seeing the Orthopedic doctor & having in-home physical therapy and she does not feel like she needs to be seen at this time. She states it is very difficult for her to ambulate. She states will schedule a visit with Dr. Larose Kells once she completes her PT & is able to get around better.

## 2020-08-21 ENCOUNTER — Telehealth: Payer: Self-pay | Admitting: *Deleted

## 2020-08-21 NOTE — Telephone Encounter (Signed)
[name] [name] called the office and voiced a concern about her home therapy with [facility].  She reports they opened the case last Wednesday but cannot start therapy until this weekend at the earliest.  She is concerned about going this long without therapy.  She is staying with her sister in [place].  I have reached out to Ovidio Kin MSW by email to see if she can assist with this issue. [name] Folmer is not scheduled to be seen by our office until 09/04/20.

## 2020-08-22 NOTE — Telephone Encounter (Signed)
I spoke with [name] [name] and she says she is actually doing quite well and walking a lot.  She asks if she can just forgo the PT and go back to her home.  She has neighbors that will keep checking on her.

## 2020-08-22 NOTE — Telephone Encounter (Signed)
I let her know.

## 2020-08-22 NOTE — Telephone Encounter (Signed)
I would like for her to see therapy at least once for evaluation. Thanks.

## 2020-08-22 NOTE — Telephone Encounter (Signed)
Thank you.  She was doing relatively well and given exercises, which she can continue until therapies are able to come out (which is only a couple of days).  With the holidays, I assume availability will be limited across the board.

## 2020-08-24 DIAGNOSIS — I10 Essential (primary) hypertension: Secondary | ICD-10-CM | POA: Diagnosis not present

## 2020-08-24 DIAGNOSIS — M19041 Primary osteoarthritis, right hand: Secondary | ICD-10-CM | POA: Diagnosis not present

## 2020-08-24 DIAGNOSIS — R739 Hyperglycemia, unspecified: Secondary | ICD-10-CM | POA: Diagnosis not present

## 2020-08-24 DIAGNOSIS — K5901 Slow transit constipation: Secondary | ICD-10-CM | POA: Diagnosis not present

## 2020-08-24 DIAGNOSIS — D62 Acute posthemorrhagic anemia: Secondary | ICD-10-CM | POA: Diagnosis not present

## 2020-08-24 DIAGNOSIS — M80852D Other osteoporosis with current pathological fracture, left femur, subsequent encounter for fracture with routine healing: Secondary | ICD-10-CM | POA: Diagnosis not present

## 2020-08-24 DIAGNOSIS — S32810A Multiple fractures of pelvis with stable disruption of pelvic ring, initial encounter for closed fracture: Secondary | ICD-10-CM | POA: Diagnosis not present

## 2020-08-24 DIAGNOSIS — E7800 Pure hypercholesterolemia, unspecified: Secondary | ICD-10-CM | POA: Diagnosis not present

## 2020-08-24 DIAGNOSIS — W19XXXD Unspecified fall, subsequent encounter: Secondary | ICD-10-CM | POA: Diagnosis not present

## 2020-08-24 DIAGNOSIS — Z85118 Personal history of other malignant neoplasm of bronchus and lung: Secondary | ICD-10-CM | POA: Diagnosis not present

## 2020-08-24 DIAGNOSIS — Z9181 History of falling: Secondary | ICD-10-CM | POA: Diagnosis not present

## 2020-08-24 DIAGNOSIS — Z7982 Long term (current) use of aspirin: Secondary | ICD-10-CM | POA: Diagnosis not present

## 2020-08-24 DIAGNOSIS — Z85828 Personal history of other malignant neoplasm of skin: Secondary | ICD-10-CM | POA: Diagnosis not present

## 2020-08-24 DIAGNOSIS — M19042 Primary osteoarthritis, left hand: Secondary | ICD-10-CM | POA: Diagnosis not present

## 2020-08-24 DIAGNOSIS — Z87891 Personal history of nicotine dependence: Secondary | ICD-10-CM | POA: Diagnosis not present

## 2020-08-24 DIAGNOSIS — F419 Anxiety disorder, unspecified: Secondary | ICD-10-CM | POA: Diagnosis not present

## 2020-08-28 ENCOUNTER — Other Ambulatory Visit: Payer: Self-pay | Admitting: Internal Medicine

## 2020-08-28 DIAGNOSIS — M80852D Other osteoporosis with current pathological fracture, left femur, subsequent encounter for fracture with routine healing: Secondary | ICD-10-CM | POA: Diagnosis not present

## 2020-08-28 DIAGNOSIS — I10 Essential (primary) hypertension: Secondary | ICD-10-CM | POA: Diagnosis not present

## 2020-08-28 DIAGNOSIS — M19041 Primary osteoarthritis, right hand: Secondary | ICD-10-CM | POA: Diagnosis not present

## 2020-08-28 DIAGNOSIS — F419 Anxiety disorder, unspecified: Secondary | ICD-10-CM | POA: Diagnosis not present

## 2020-08-28 DIAGNOSIS — E7849 Other hyperlipidemia: Secondary | ICD-10-CM

## 2020-08-28 DIAGNOSIS — M19042 Primary osteoarthritis, left hand: Secondary | ICD-10-CM | POA: Diagnosis not present

## 2020-08-28 DIAGNOSIS — I2510 Atherosclerotic heart disease of native coronary artery without angina pectoris: Secondary | ICD-10-CM

## 2020-08-28 DIAGNOSIS — D62 Acute posthemorrhagic anemia: Secondary | ICD-10-CM | POA: Diagnosis not present

## 2020-08-31 ENCOUNTER — Telehealth: Payer: Self-pay

## 2020-08-31 DIAGNOSIS — Z9181 History of falling: Secondary | ICD-10-CM

## 2020-08-31 DIAGNOSIS — M800AXD Age-related osteoporosis with current pathological fracture, other site, subsequent encounter for fracture with routine healing: Secondary | ICD-10-CM | POA: Diagnosis not present

## 2020-08-31 DIAGNOSIS — Z85118 Personal history of other malignant neoplasm of bronchus and lung: Secondary | ICD-10-CM | POA: Diagnosis not present

## 2020-08-31 DIAGNOSIS — Z87891 Personal history of nicotine dependence: Secondary | ICD-10-CM

## 2020-08-31 DIAGNOSIS — M19042 Primary osteoarthritis, left hand: Secondary | ICD-10-CM

## 2020-08-31 DIAGNOSIS — Z7982 Long term (current) use of aspirin: Secondary | ICD-10-CM | POA: Diagnosis not present

## 2020-08-31 DIAGNOSIS — I10 Essential (primary) hypertension: Secondary | ICD-10-CM | POA: Diagnosis not present

## 2020-08-31 DIAGNOSIS — D509 Iron deficiency anemia, unspecified: Secondary | ICD-10-CM | POA: Diagnosis not present

## 2020-08-31 DIAGNOSIS — R739 Hyperglycemia, unspecified: Secondary | ICD-10-CM

## 2020-08-31 DIAGNOSIS — M150 Primary generalized (osteo)arthritis: Secondary | ICD-10-CM | POA: Diagnosis not present

## 2020-08-31 DIAGNOSIS — M19041 Primary osteoarthritis, right hand: Secondary | ICD-10-CM

## 2020-08-31 DIAGNOSIS — K5901 Slow transit constipation: Secondary | ICD-10-CM

## 2020-08-31 DIAGNOSIS — D62 Acute posthemorrhagic anemia: Secondary | ICD-10-CM

## 2020-08-31 DIAGNOSIS — E7800 Pure hypercholesterolemia, unspecified: Secondary | ICD-10-CM

## 2020-08-31 DIAGNOSIS — E785 Hyperlipidemia, unspecified: Secondary | ICD-10-CM | POA: Diagnosis not present

## 2020-08-31 DIAGNOSIS — W19XXXD Unspecified fall, subsequent encounter: Secondary | ICD-10-CM

## 2020-08-31 DIAGNOSIS — Z923 Personal history of irradiation: Secondary | ICD-10-CM | POA: Diagnosis not present

## 2020-08-31 DIAGNOSIS — Z9221 Personal history of antineoplastic chemotherapy: Secondary | ICD-10-CM | POA: Diagnosis not present

## 2020-08-31 DIAGNOSIS — Z85820 Personal history of malignant melanoma of skin: Secondary | ICD-10-CM | POA: Diagnosis not present

## 2020-08-31 DIAGNOSIS — S300XXD Contusion of lower back and pelvis, subsequent encounter: Secondary | ICD-10-CM | POA: Diagnosis not present

## 2020-08-31 DIAGNOSIS — M80852D Other osteoporosis with current pathological fracture, left femur, subsequent encounter for fracture with routine healing: Secondary | ICD-10-CM

## 2020-08-31 DIAGNOSIS — F419 Anxiety disorder, unspecified: Secondary | ICD-10-CM | POA: Diagnosis not present

## 2020-08-31 DIAGNOSIS — Z85828 Personal history of other malignant neoplasm of skin: Secondary | ICD-10-CM

## 2020-08-31 NOTE — Telephone Encounter (Signed)
Plan of care signed and faxed back to [facility] at [phone]. Form sent for scanning.

## 2020-09-01 DIAGNOSIS — M800AXD Age-related osteoporosis with current pathological fracture, other site, subsequent encounter for fracture with routine healing: Secondary | ICD-10-CM | POA: Diagnosis not present

## 2020-09-01 DIAGNOSIS — Z85820 Personal history of malignant melanoma of skin: Secondary | ICD-10-CM | POA: Diagnosis not present

## 2020-09-01 DIAGNOSIS — Z7982 Long term (current) use of aspirin: Secondary | ICD-10-CM | POA: Diagnosis not present

## 2020-09-01 DIAGNOSIS — Z85118 Personal history of other malignant neoplasm of bronchus and lung: Secondary | ICD-10-CM | POA: Diagnosis not present

## 2020-09-01 DIAGNOSIS — Z9221 Personal history of antineoplastic chemotherapy: Secondary | ICD-10-CM | POA: Diagnosis not present

## 2020-09-01 DIAGNOSIS — F419 Anxiety disorder, unspecified: Secondary | ICD-10-CM | POA: Diagnosis not present

## 2020-09-01 DIAGNOSIS — Z87891 Personal history of nicotine dependence: Secondary | ICD-10-CM | POA: Diagnosis not present

## 2020-09-01 DIAGNOSIS — S300XXD Contusion of lower back and pelvis, subsequent encounter: Secondary | ICD-10-CM | POA: Diagnosis not present

## 2020-09-01 DIAGNOSIS — I10 Essential (primary) hypertension: Secondary | ICD-10-CM | POA: Diagnosis not present

## 2020-09-01 DIAGNOSIS — M150 Primary generalized (osteo)arthritis: Secondary | ICD-10-CM | POA: Diagnosis not present

## 2020-09-01 DIAGNOSIS — Z923 Personal history of irradiation: Secondary | ICD-10-CM | POA: Diagnosis not present

## 2020-09-01 DIAGNOSIS — W19XXXD Unspecified fall, subsequent encounter: Secondary | ICD-10-CM | POA: Diagnosis not present

## 2020-09-01 DIAGNOSIS — D509 Iron deficiency anemia, unspecified: Secondary | ICD-10-CM | POA: Diagnosis not present

## 2020-09-01 DIAGNOSIS — E785 Hyperlipidemia, unspecified: Secondary | ICD-10-CM | POA: Diagnosis not present

## 2020-09-01 DIAGNOSIS — Z9181 History of falling: Secondary | ICD-10-CM | POA: Diagnosis not present

## 2020-09-03 DIAGNOSIS — M800AXD Age-related osteoporosis with current pathological fracture, other site, subsequent encounter for fracture with routine healing: Secondary | ICD-10-CM | POA: Diagnosis not present

## 2020-09-03 DIAGNOSIS — S300XXD Contusion of lower back and pelvis, subsequent encounter: Secondary | ICD-10-CM | POA: Diagnosis not present

## 2020-09-03 DIAGNOSIS — D509 Iron deficiency anemia, unspecified: Secondary | ICD-10-CM | POA: Diagnosis not present

## 2020-09-03 DIAGNOSIS — I10 Essential (primary) hypertension: Secondary | ICD-10-CM | POA: Diagnosis not present

## 2020-09-03 DIAGNOSIS — F419 Anxiety disorder, unspecified: Secondary | ICD-10-CM | POA: Diagnosis not present

## 2020-09-03 DIAGNOSIS — M150 Primary generalized (osteo)arthritis: Secondary | ICD-10-CM | POA: Diagnosis not present

## 2020-09-04 ENCOUNTER — Telehealth: Payer: Self-pay | Admitting: Internal Medicine

## 2020-09-04 ENCOUNTER — Encounter: Payer: Medicare Other | Admitting: Registered Nurse

## 2020-09-04 NOTE — Telephone Encounter (Signed)
LMOM for [name] [name] w/ verbal orders.

## 2020-09-04 NOTE — Telephone Encounter (Signed)
Caller Tommi Emery O.T  Call back number [phone]  1 time a week  For 4 Weeks adliadl, transfer and exercise

## 2020-09-05 DIAGNOSIS — S300XXD Contusion of lower back and pelvis, subsequent encounter: Secondary | ICD-10-CM | POA: Diagnosis not present

## 2020-09-05 DIAGNOSIS — M150 Primary generalized (osteo)arthritis: Secondary | ICD-10-CM | POA: Diagnosis not present

## 2020-09-05 DIAGNOSIS — F419 Anxiety disorder, unspecified: Secondary | ICD-10-CM | POA: Diagnosis not present

## 2020-09-05 DIAGNOSIS — M800AXD Age-related osteoporosis with current pathological fracture, other site, subsequent encounter for fracture with routine healing: Secondary | ICD-10-CM | POA: Diagnosis not present

## 2020-09-05 DIAGNOSIS — D509 Iron deficiency anemia, unspecified: Secondary | ICD-10-CM | POA: Diagnosis not present

## 2020-09-05 DIAGNOSIS — I10 Essential (primary) hypertension: Secondary | ICD-10-CM | POA: Diagnosis not present

## 2020-09-11 ENCOUNTER — Telehealth: Payer: Self-pay | Admitting: Internal Medicine

## 2020-09-11 DIAGNOSIS — D509 Iron deficiency anemia, unspecified: Secondary | ICD-10-CM | POA: Diagnosis not present

## 2020-09-11 DIAGNOSIS — M150 Primary generalized (osteo)arthritis: Secondary | ICD-10-CM | POA: Diagnosis not present

## 2020-09-11 DIAGNOSIS — F419 Anxiety disorder, unspecified: Secondary | ICD-10-CM | POA: Diagnosis not present

## 2020-09-11 DIAGNOSIS — M800AXD Age-related osteoporosis with current pathological fracture, other site, subsequent encounter for fracture with routine healing: Secondary | ICD-10-CM | POA: Diagnosis not present

## 2020-09-11 DIAGNOSIS — S300XXD Contusion of lower back and pelvis, subsequent encounter: Secondary | ICD-10-CM | POA: Diagnosis not present

## 2020-09-11 DIAGNOSIS — I10 Essential (primary) hypertension: Secondary | ICD-10-CM | POA: Diagnosis not present

## 2020-09-11 NOTE — Telephone Encounter (Addendum)
Spoke w/ Pt- she isn't able to get out of house yet. She was unable to sleep this morning, and took 2 tablets of tramadol this morning at 4am and vomited a few times, ortho is aware. (she states this happens when she takes it on an empty stomach). OT showed up at home before she took her lisinopril this morning, bp was 150/90 and 140/90. She see's ortho on Thursday, September 13, 2020. HR is now 99-103, she states she feels perfectly fine but would do a virtual visit if PCP requests it.

## 2020-09-11 NOTE — Telephone Encounter (Signed)
If she has chest pain, palpitations, calf pain or swelling: ER Continue monitoring vital signs, call if heart rate or blood pressures are consistently elevated.

## 2020-09-11 NOTE — Telephone Encounter (Signed)
Patient is tachycardic, unable to provide further advice without seeing the patient, please arrange. If she is unable to come to the office,will do a virtual but that is not optimal.

## 2020-09-11 NOTE — Telephone Encounter (Signed)
Spoke w/ Pt- informed of recommendations. Pt verbalized understanding.  

## 2020-09-11 NOTE — Telephone Encounter (Signed)
Just an FYI

## 2020-09-11 NOTE — Telephone Encounter (Signed)
Tommi Emery from [phone]  Saw patient this morning heart rate  was 112 to 114 @ rest blood pressure 140/90 no symptomatic, no complain call back if you have any comment

## 2020-09-12 ENCOUNTER — Telehealth: Payer: Self-pay | Admitting: *Deleted

## 2020-09-12 DIAGNOSIS — D509 Iron deficiency anemia, unspecified: Secondary | ICD-10-CM | POA: Diagnosis not present

## 2020-09-12 DIAGNOSIS — I10 Essential (primary) hypertension: Secondary | ICD-10-CM | POA: Diagnosis not present

## 2020-09-12 DIAGNOSIS — S300XXD Contusion of lower back and pelvis, subsequent encounter: Secondary | ICD-10-CM | POA: Diagnosis not present

## 2020-09-12 DIAGNOSIS — F419 Anxiety disorder, unspecified: Secondary | ICD-10-CM | POA: Diagnosis not present

## 2020-09-12 DIAGNOSIS — M150 Primary generalized (osteo)arthritis: Secondary | ICD-10-CM | POA: Diagnosis not present

## 2020-09-12 DIAGNOSIS — M800AXD Age-related osteoporosis with current pathological fracture, other site, subsequent encounter for fracture with routine healing: Secondary | ICD-10-CM | POA: Diagnosis not present

## 2020-09-12 NOTE — Telephone Encounter (Signed)
Patient called thinking she needs an xray on her leg. Wants to know if she needs to reschedule her appt to get the xray.

## 2020-09-12 NOTE — Telephone Encounter (Signed)
[facility] from [place] called to give information preceding [name] [name] has appt with Zella Ball tomorrow for her hospital follow up. She fell a few weeks ago and fractured her hip(Left) but has been complaining of her right hip hurting her now.  This is FYI for [place] before the visit.

## 2020-09-13 ENCOUNTER — Encounter: Payer: Self-pay | Admitting: Registered Nurse

## 2020-09-13 ENCOUNTER — Telehealth: Payer: Self-pay

## 2020-09-13 ENCOUNTER — Encounter: Payer: Medicare Other | Attending: Registered Nurse | Admitting: Registered Nurse

## 2020-09-13 ENCOUNTER — Other Ambulatory Visit: Payer: Self-pay

## 2020-09-13 VITALS — BP 104/68 | HR 121 | Temp 99.1°F | Ht 63.0 in | Wt 159.3 lb

## 2020-09-13 DIAGNOSIS — S329XXS Fracture of unspecified parts of lumbosacral spine and pelvis, sequela: Secondary | ICD-10-CM

## 2020-09-13 DIAGNOSIS — I2510 Atherosclerotic heart disease of native coronary artery without angina pectoris: Secondary | ICD-10-CM | POA: Diagnosis not present

## 2020-09-13 DIAGNOSIS — M545 Low back pain, unspecified: Secondary | ICD-10-CM | POA: Diagnosis not present

## 2020-09-13 DIAGNOSIS — I10 Essential (primary) hypertension: Secondary | ICD-10-CM | POA: Insufficient documentation

## 2020-09-13 NOTE — Progress Notes (Signed)
Subjective:    Patient ID: [name] [name], female    DOB: [birth year], 70 y.o.   MRN: [id number]  HPI: [name] [name] is a 70 y.o. female who is here for [place] appointment for follow up of her closed pelvic fracture, Essential hypertension and complaints of right sided low back pain without sciatica. She came to [facility] on 07/30/2020 with complaints of left hip pain after falling of a stool in her kitchen. Orthopedic was consulted.  DG Pelvis: IMPRESSION: 1. Fractures of the left superior and inferior pubic rami of indeterminate age DG Femur:  IMPRESSION: Acute fracture of the left superior and left inferior pubic rami. CT Pelvis WO Contrast:  IMPRESSION: Acute fractures of the left superior and left inferior pubic rami with small adjacent pelvic hematomas.  Aortic Atherosclerosis (ICD10-I70.0).  Orthopedic recommended WBAT, she will F/U with Dr Percell Miller after discharge.   [name] was admitted to inpatient rehabilitation on 08/03/2020 and discharged home on 08/15/2020. She is receiving [place] from [facility]. She states she has pain in her lower back mainly on right side, she reports the pain began 4 days ago, when she began  using the cane to walk and denies falling. Also states the therapist recommended her going back to the walker, since her pain began. Will order Lumbar X-ray today, she verbalizes understanding.  She rates her pain 5.   [name] arrived with tachycardia and apical pulse was checked her pulse 120, she will follow up with her pCP.    Pain Inventory Average Pain 5 Pain Right Now 5 My pain is sharp and aching  In the last 24 hours, has pain interfered with the following? General activity 7 Relation with others 9 Enjoyment of life 0 What TIME of day is your pain at its worst? night Sleep (in general) Poor  Pain is worse with: walking, bending and standing Pain improves with: medication Relief from Meds:  4  walk with assistance use a cane use a walker ability to climb steps?  no do you drive?  no needs help with transfers  not employed: date last employed . I need assistance with the following:  feeding, dressing, bathing, toileting, meal prep, household duties and shopping  weakness trouble walking  hospital f/u  hos[italf/u    Family History  Problem Relation Age of Onset  . Bladder Cancer Father   . Stroke Father   . Colon polyps Father   . Atrial fibrillation Mother   . Heart disease Mother        CHF  . Clotting disorder Mother        warfarin  . Colon polyps Mother   . Colon cancer Maternal Aunt 32  . Drug abuse Son   . Breast cancer Neg Hx   . Esophageal cancer Neg Hx   . Stomach cancer Neg Hx   . Rectal cancer Neg Hx    Social History   Socioeconomic History  . Marital status: Divorced    Spouse name: Not on file  . Number of children: 2  . Years of education: Not on file  . Highest education level: Not on file  Occupational History  . Occupation: retired, Recruitment consultant  . Smoking status: Former Smoker    Types: Cigarettes    Quit date: 09/08/2011    Years since quitting: 9.0  . Smokeless tobacco: Never Used  Substance and Sexual Activity  . Alcohol use: Yes    Comment: occasional  .  Drug use: No  . Sexual activity: Not on file  Other Topics Concern  . Not on file  Social History Narrative   Divorced   Lost 1 child June 2019    lives by herself       Social Determinants of Health   Financial Resource Strain: Not on file  Food Insecurity: Not on file  Transportation Needs: Not on file  Physical Activity: Not on file  Stress: Not on file  Social Connections: Not on file   Past Surgical History:  Procedure Laterality Date  . CHOLECYSTECTOMY  04/2011   biliary stent placement  . COLONOSCOPY    . [place]  2015  . MOHS SURGERY Right 02/2017   cheek and eyelid  . Josph Macho Touch     Vaginal procedure  .  POLYPECTOMY    . TUBAL LIGATION     Past Medical History:  Diagnosis Date  . Anemia    duering cancer treatment   . Anxiety   . Arthritis    OA hands   . Bilateral lower extremity pain   . Blood transfusion without reported diagnosis    during chemo for lung cancer  . Cataract    forming  but very small   . Cholelithiasis   . Colon polyps    adenomatous  . High cholesterol   . History of chemotherapy    for lung cancer- completed 2013  . Hx of radiation therapy 10/07/11 to 11/20/11   right lung  . Hx of radiation therapy 01/07/2012- 01/21/12   cranial irradiation  . Hypertension   . Lung cancer ([place]) 09/19/11   Stage III currently in remission  . Malignant melanoma of skin of canthus of right eye ([place]) 02/2017  . Osteoporosis 04/2014   Dexa scan by Dr. Corinna Capra, T-score -2.7, next in 2 years, with at least 13 % major fracture in 10 years  . Pitting edema    Bilateral lower extremities, duplex ultrasound 04/03/2015 normal, no DVT   Temp 99.1 F (37.3 C)   Ht 5\' 3"  (1.6 m)   Wt 159 lb 4.8 oz (72.3 kg)   BMI 28.22 kg/m   Opioid Risk Score:   Fall Risk Score:  `1  Depression screen PHQ 2/9  Depression screen [facility] 2/9 09/13/2020 08/11/2019 07/15/2019 07/13/2018 03/11/2018 07/21/2016 11/19/2015  Decreased Interest 1 0 0 0 0 0 0  Down, Depressed, Hopeless 0 0 0 0 0 0 0  PHQ - 2 Score 1 0 0 0 0 0 0  Altered sleeping 2 - 2 1 - - -  Tired, decreased energy 3 - 0 0 - - -  Change in appetite 1 - 0 0 - - -  Feeling bad or failure about yourself  0 - 0 0 - - -  Trouble concentrating 0 - 1 0 - - -  Moving slowly or fidgety/restless 0 - 0 0 - - -  Suicidal thoughts 0 - 0 0 - - -  PHQ-9 Score 7 - 3 1 - - -  Difficult doing work/chores Extremely dIfficult - Not difficult at all Not difficult at all - - -  Some recent data might be hidden   Review of Systems  Constitutional: Positive for appetite change.  HENT: Negative.   Eyes: Negative.   Respiratory: Positive for cough.    Cardiovascular: Negative.   Gastrointestinal: Positive for diarrhea, nausea and vomiting.  Endocrine: Negative.   Genitourinary: Negative.   Musculoskeletal: Positive for arthralgias and  gait problem.  Allergic/Immunologic: Negative.   Hematological: Negative.   Psychiatric/Behavioral: Negative.   All other systems reviewed and are negative.      Objective:   Physical Exam Vitals and nursing note reviewed.  Constitutional:      Appearance: Normal appearance.  Cardiovascular:     Rate and Rhythm: Normal rate and regular rhythm.     Pulses: Normal pulses.     Heart sounds: Normal heart sounds.  Pulmonary:     Effort: Pulmonary effort is normal.     Breath sounds: Normal breath sounds.  Musculoskeletal:     Cervical back: Normal range of motion and neck supple.     Comments: Normal Muscle Bulk and Muscle Testing Reveals:  Upper Extremities: Full ROM and Muscle Strength 5/5 Lumbar Paraspinal Tenderness: L-4-L-5 Mainly Right Side Lower Extremities: Full ROM and Muscle Strength 5/5 Arrived in wheelchair  Skin:    General: Skin is warm and dry.  Neurological:     Mental Status: She is alert and oriented to person, place, and time.  Psychiatric:        Mood and Affect: Mood normal.        Behavior: Behavior normal.           Assessment & Plan:   1.Closed pelvic fracture: Ortho Following. Continue WBAT and Continue Home Health Therapy with [facility].  2. Essential hypertension: Continue current medication regimen. PCP following. Continue to monitor.   3.complaints of right sided low back pain without sciatica: RX: Lumbar X-ray: Results pending.   F/U with Dr Posey Pronto in 4- 6 weeks

## 2020-09-13 NOTE — Telephone Encounter (Signed)
Plan of care signed and faxed back to [facility] at [phone]. Form sent for scanning.  

## 2020-09-13 NOTE — Patient Instructions (Signed)
Call Dr Percell Miller to [facility] Follow Up Appointment [address]  [address]  [phone]   Call Primary Care to schedule HFU appointment

## 2020-09-13 NOTE — Telephone Encounter (Signed)
[name] [name], came to her [place] appointment today.

## 2020-09-17 ENCOUNTER — Other Ambulatory Visit: Payer: Self-pay

## 2020-09-17 ENCOUNTER — Ambulatory Visit
Admission: RE | Admit: 2020-09-17 | Discharge: 2020-09-17 | Disposition: A | Discharge status: Home / Self Care | Payer: Medicare Other | Source: Ambulatory Visit | Attending: Registered Nurse | Admitting: Registered Nurse

## 2020-09-17 DIAGNOSIS — M545 Low back pain, unspecified: Secondary | ICD-10-CM | POA: Diagnosis not present

## 2020-09-18 ENCOUNTER — Telehealth: Payer: Self-pay

## 2020-09-18 ENCOUNTER — Telehealth: Payer: Self-pay | Admitting: Registered Nurse

## 2020-09-18 NOTE — Telephone Encounter (Signed)
Return a call to [name] [name], was trying to review her X-ray results her. She was constantly complaining about her results, and office policy, she became very belligerent.  Her phone call was transferred to [facility].

## 2020-09-18 NOTE — Telephone Encounter (Signed)
Patient was transferred from NP- she was giving her a difficult time because xray was not read.  NP advised that she ordered xray for MD - he will review when in office -  NP explained to patient Ortho can  is her new md of choice because she is very unhappy with Dr Posey Pronto because she is still in pain.   I advised patient that [facility] can transfer their images to MD of choice if she is that unhappy with our MD.  Dr. Posey Pronto is not in office this afternoon - not sure he has read it yet-- before I can offer to try to reach Dr Posey Pronto She yelled at me  Raliegh Ip doesn't know who Dr Posey Pronto is - she used profanity about he must be pretty bad if other MD's.  She stated I didn't need to do anything she would handle it - I said okay and ended call.   NOTE: I HAD VOICEMAIL TRANSFERRED TO ME AT 3 OCLOCK that patient is upset about xray not being read.  Her first voicemail was at 12:59 second voicemail at 2:36--- Clinic sent to me because she was very upset and yelling on message.  Before I could call on message patient was transferred to me live.    Not sure if patient was told by radiology results would be read and MD would call within 24 hours - but MD not on site .

## 2020-09-18 NOTE — Telephone Encounter (Signed)
Patient very UPSET. Would like the results of her xray before she goes to bed tonight. States she has been calling all day today and no one has returned her call.  Is waiting on these results for a second opinion.  States she has  Did advise patient that Dr Posey Pronto could review that with her on the 28 at next appointment. She states that was not soon enough.

## 2020-09-19 DIAGNOSIS — S32592D Other specified fracture of left pubis, subsequent encounter for fracture with routine healing: Secondary | ICD-10-CM | POA: Diagnosis not present

## 2020-09-19 DIAGNOSIS — R102 Pelvic and perineal pain: Secondary | ICD-10-CM | POA: Diagnosis not present

## 2020-09-19 DIAGNOSIS — S32592S Other specified fracture of left pubis, sequela: Secondary | ICD-10-CM | POA: Diagnosis not present

## 2020-09-19 NOTE — Telephone Encounter (Signed)
I am sorry to hear that.  My last communication with the patient was when she stated she was doing so well that she did not want home health therapies.  I encouraged her to at least have an evaluation by home health and states she did not need therapies then she need not continue.  I have not seen her nor had any communication with her since that time.  I do not know anything about increasing pain, nor was I aware that an x-ray has been ordered. On review, I do see in x-ray from December, I am not sure if this is the one she is referring to, but it shows degenerative changes.  Nonetheless, she would prefer to follow-up with Ortho, she is welcome to do so.  Thanks.

## 2020-09-24 ENCOUNTER — Telehealth: Payer: Self-pay | Admitting: Internal Medicine

## 2020-09-24 DIAGNOSIS — F419 Anxiety disorder, unspecified: Secondary | ICD-10-CM | POA: Diagnosis not present

## 2020-09-24 DIAGNOSIS — M150 Primary generalized (osteo)arthritis: Secondary | ICD-10-CM | POA: Diagnosis not present

## 2020-09-24 DIAGNOSIS — D509 Iron deficiency anemia, unspecified: Secondary | ICD-10-CM | POA: Diagnosis not present

## 2020-09-24 DIAGNOSIS — M800AXD Age-related osteoporosis with current pathological fracture, other site, subsequent encounter for fracture with routine healing: Secondary | ICD-10-CM | POA: Diagnosis not present

## 2020-09-24 DIAGNOSIS — S300XXD Contusion of lower back and pelvis, subsequent encounter: Secondary | ICD-10-CM | POA: Diagnosis not present

## 2020-09-24 DIAGNOSIS — I10 Essential (primary) hypertension: Secondary | ICD-10-CM | POA: Diagnosis not present

## 2020-09-24 NOTE — Telephone Encounter (Signed)
Spoke w/ Pt- informed of recommendations. Pt verbalized understanding.  

## 2020-09-24 NOTE — Telephone Encounter (Signed)
Patient states she was hospitalized  last year for a few weeks b/c of her broken pelvic.Since then patient has discovered she has  arthritis in her back. Patient would like to know where she go for arthritis.

## 2020-09-24 NOTE — Telephone Encounter (Signed)
Please advise 

## 2020-09-24 NOTE — Telephone Encounter (Signed)
It depends, a orthopedic doctor is typically the person to see.

## 2020-09-25 DIAGNOSIS — M545 Low back pain, unspecified: Secondary | ICD-10-CM | POA: Diagnosis not present

## 2020-09-27 ENCOUNTER — Telehealth: Payer: Self-pay | Admitting: Internal Medicine

## 2020-09-27 NOTE — Telephone Encounter (Signed)
Spoke w/ Timmothy Sours, verbal orders given.

## 2020-09-27 NOTE — Telephone Encounter (Signed)
Caller: Durwin Reges) Call back : [phone]  Would like to add an  OT visit due to missing appointment on 09/19/20  Ok to leave message

## 2020-09-28 DIAGNOSIS — M545 Low back pain, unspecified: Secondary | ICD-10-CM | POA: Diagnosis not present

## 2020-10-01 DIAGNOSIS — Z87891 Personal history of nicotine dependence: Secondary | ICD-10-CM | POA: Diagnosis not present

## 2020-10-01 DIAGNOSIS — Z85118 Personal history of other malignant neoplasm of bronchus and lung: Secondary | ICD-10-CM | POA: Diagnosis not present

## 2020-10-01 DIAGNOSIS — D509 Iron deficiency anemia, unspecified: Secondary | ICD-10-CM | POA: Diagnosis not present

## 2020-10-01 DIAGNOSIS — M545 Low back pain, unspecified: Secondary | ICD-10-CM | POA: Diagnosis not present

## 2020-10-01 DIAGNOSIS — S300XXD Contusion of lower back and pelvis, subsequent encounter: Secondary | ICD-10-CM | POA: Diagnosis not present

## 2020-10-01 DIAGNOSIS — F419 Anxiety disorder, unspecified: Secondary | ICD-10-CM | POA: Diagnosis not present

## 2020-10-01 DIAGNOSIS — Z9221 Personal history of antineoplastic chemotherapy: Secondary | ICD-10-CM | POA: Diagnosis not present

## 2020-10-01 DIAGNOSIS — M800AXD Age-related osteoporosis with current pathological fracture, other site, subsequent encounter for fracture with routine healing: Secondary | ICD-10-CM | POA: Diagnosis not present

## 2020-10-01 DIAGNOSIS — E785 Hyperlipidemia, unspecified: Secondary | ICD-10-CM | POA: Diagnosis not present

## 2020-10-01 DIAGNOSIS — I10 Essential (primary) hypertension: Secondary | ICD-10-CM | POA: Diagnosis not present

## 2020-10-01 DIAGNOSIS — M150 Primary generalized (osteo)arthritis: Secondary | ICD-10-CM | POA: Diagnosis not present

## 2020-10-01 DIAGNOSIS — Z7982 Long term (current) use of aspirin: Secondary | ICD-10-CM | POA: Diagnosis not present

## 2020-10-01 DIAGNOSIS — Z9181 History of falling: Secondary | ICD-10-CM | POA: Diagnosis not present

## 2020-10-01 DIAGNOSIS — Z923 Personal history of irradiation: Secondary | ICD-10-CM | POA: Diagnosis not present

## 2020-10-01 DIAGNOSIS — Z85820 Personal history of malignant melanoma of skin: Secondary | ICD-10-CM | POA: Diagnosis not present

## 2020-10-01 DIAGNOSIS — W19XXXD Unspecified fall, subsequent encounter: Secondary | ICD-10-CM | POA: Diagnosis not present

## 2020-10-03 ENCOUNTER — Other Ambulatory Visit: Payer: Self-pay | Admitting: Internal Medicine

## 2020-10-03 ENCOUNTER — Telehealth: Payer: Self-pay | Admitting: Internal Medicine

## 2020-10-03 DIAGNOSIS — I10 Essential (primary) hypertension: Secondary | ICD-10-CM | POA: Diagnosis not present

## 2020-10-03 DIAGNOSIS — M800AXD Age-related osteoporosis with current pathological fracture, other site, subsequent encounter for fracture with routine healing: Secondary | ICD-10-CM | POA: Diagnosis not present

## 2020-10-03 DIAGNOSIS — M150 Primary generalized (osteo)arthritis: Secondary | ICD-10-CM | POA: Diagnosis not present

## 2020-10-03 DIAGNOSIS — D509 Iron deficiency anemia, unspecified: Secondary | ICD-10-CM | POA: Diagnosis not present

## 2020-10-03 DIAGNOSIS — F419 Anxiety disorder, unspecified: Secondary | ICD-10-CM | POA: Diagnosis not present

## 2020-10-03 DIAGNOSIS — S300XXD Contusion of lower back and pelvis, subsequent encounter: Secondary | ICD-10-CM | POA: Diagnosis not present

## 2020-10-03 MED ORDER — METOPROLOL TARTRATE 25 MG PO TABS: 25.0000 mg | ORAL_TABLET | Freq: Two times a day (BID) | ORAL | 1 refills | Status: DC

## 2020-10-03 NOTE — Telephone Encounter (Signed)
Call patient, BP is slightly elevated, currently on lisinopril low-dose.  Recommend to ADD metoprolol 25 mg  1 p.o. twice daily, send a 83-month supply, monitor BPs, call with readings in 2 weeks

## 2020-10-03 NOTE — Telephone Encounter (Signed)
Patient calling you back 

## 2020-10-03 NOTE — Telephone Encounter (Signed)
Spoke w/ Pt- informed of recommendations. Pt verbalized understanding.  

## 2020-10-03 NOTE — Telephone Encounter (Signed)
Tried calling Pt- voicemail box is full. I will go ahead and send the metoprolol to [facility] and try again later.

## 2020-10-03 NOTE — Telephone Encounter (Signed)
Please advise 

## 2020-10-03 NOTE — Telephone Encounter (Signed)
LMOM informing Don of plans. Will call Pt shortly to make her aware.

## 2020-10-03 NOTE — Telephone Encounter (Signed)
Tried calling Pt back- no answer, voicemail box full.

## 2020-10-03 NOTE — Telephone Encounter (Signed)
CallerTimmothy [name] [facility]) Call back [phone]  Calling to report blood pressure at rest 152/86, 2 mins after activity 158/92 pulse 97 (which is normal for pt).  Patient took her blood pressure medication at 09:15am. Patient has no complaints.  [name] [name] would like recommendations

## 2020-10-22 ENCOUNTER — Encounter: Payer: 59 | Attending: Registered Nurse | Admitting: Physical Medicine & Rehabilitation

## 2020-10-22 DIAGNOSIS — M545 Low back pain, unspecified: Secondary | ICD-10-CM | POA: Insufficient documentation

## 2020-10-22 DIAGNOSIS — I10 Essential (primary) hypertension: Secondary | ICD-10-CM | POA: Insufficient documentation

## 2020-11-19 ENCOUNTER — Encounter: Payer: Self-pay | Admitting: Internal Medicine

## 2020-11-20 DIAGNOSIS — I2510 Atherosclerotic heart disease of native coronary artery without angina pectoris: Secondary | ICD-10-CM | POA: Diagnosis not present

## 2020-11-20 DIAGNOSIS — E7849 Other hyperlipidemia: Secondary | ICD-10-CM | POA: Diagnosis not present

## 2020-11-20 LAB — LIPID PANEL
Chol/HDL Ratio: 1.8 ratio (ref 0.0–4.4)
Cholesterol, Total: 159 mg/dL (ref 100–199)
HDL: 86 mg/dL (ref >39)
LDL Chol Calc (NIH): 49 mg/dL (ref 0–99)
Triglycerides: 145 mg/dL (ref 0–149)
VLDL Cholesterol Cal: 24 mg/dL (ref 5–40)

## 2020-11-23 ENCOUNTER — Encounter: Payer: Self-pay | Admitting: Internal Medicine

## 2020-11-23 ENCOUNTER — Ambulatory Visit (INDEPENDENT_AMBULATORY_CARE_PROVIDER_SITE_OTHER): Payer: Medicare Other | Admitting: Internal Medicine

## 2020-11-23 ENCOUNTER — Other Ambulatory Visit: Payer: Self-pay

## 2020-11-23 VITALS — BP 119/81 | HR 87 | Ht 62.0 in | Wt 153.6 lb

## 2020-11-23 DIAGNOSIS — E7849 Other hyperlipidemia: Secondary | ICD-10-CM | POA: Diagnosis not present

## 2020-11-23 DIAGNOSIS — R931 Abnormal findings on diagnostic imaging of heart and coronary circulation: Secondary | ICD-10-CM | POA: Diagnosis not present

## 2020-11-23 DIAGNOSIS — I2510 Atherosclerotic heart disease of native coronary artery without angina pectoris: Secondary | ICD-10-CM

## 2020-11-23 NOTE — Progress Notes (Signed)
Chief Complaint:  Follow-up dyslipidemia  Primary Care Physician: Colon Branch, MD  HPI:  [name] [name] is a 70 y.o. female who is being seen today for the evaluation of dyslipidemia and statin intolerance at the request of Colon Branch, MD.  [name] [name] is a pleasant 70 year old female with an unfortunate history of lung cancer in the past status post radiation therapy.  She also has hypertension and marked dyslipidemia.  She is currently treated on rosuvastatin 40 mg and ezetimibe 10 mg daily.  Her last lipid profile from January 2019 indicated total cholesterol 334, triglycerides 156, HDL 73 and LDL at 229.  While she does have a high HDL, I suspect that it is likely nonfunctional.  This is actually an atherogenic lipid profile.  In fact, a chest CT scan for follow-up of her lung malignancy and June 2018 demonstrated "dense coronary artery vascular calcifications".  This could be also related to radiation injury to the coronaries.  She does have a history of stroke in her father and A. fib in her mother.  She is not aware of her sister has any cardiovascular disease and she does have 2 children.  Neither of them have been screened for elevated cholesterol.  She also reports eating a very atherogenic diet.  She eats 1-2 eggs several days a week, shrimp, and chicken livers, amongst other foods.  She reports recently having problems with the diarrhea and saw Dr. Henrene Pastor, who prescribed her for Colestid.  This was for the tablets, but she reports also a history of difficulty swallowing tablets.  Based on the size of these tablets, she is not taking the medication.  03/25/2018  [name] [name] returns today for follow-up.  She is done very well at lowering her cholesterol.  She is made significant dietary changes and has been taking Colestid once daily (not twice daily) in addition to rosuvastatin 40 mg.  She is also on Zetia 10 mg daily.  Her LDL cholesterol has gone down from 229-133.  She  still not at goal given her extensive atherosclerosis of LDL less than 70.  Her last lipid profile was in April and will need to repeat that today.  07/01/2018  [name] [name] returns today for follow-up of dyslipidemia.  She is tolerating rosuvastatin 40 mg daily as well as ezetimibe 10 mg daily.  Unfortunately she has multivessel coronary artery calcification.  Her goal LDL is less than 70.  Most recently she had a lipid profile 3 months ago which showed total cholesterol 242, triglycerides 143, HDL 84 and LDL 129.  As mentioned she is a good candidate for PCSK9 inhibitor however when we applied for Repatha her insurance denied it.  It was recommended that Praluent may be the preferred agent therefore will go ahead and pursue that.  She would not be a candidate for the upcoming Vesalius trial.  04/14/2019  [name] [name] is seen today in follow-up in the lipid clinic.  Via telemedicine visit on December 06, 2018.  At that time she had prior authorization for Praluent but had not started taking it.  She was denied for patient assistance in the past program.  Ahead and get the prescription filled and start taking it.  She did do that and subsequently action in her cholesterol.  Total cholesterol has decreased from 273 down to 116.  Triglycerides are 98, HDL 82 and LDL cholesterol is improved from 159 down to 14.  Overall she loves the medicine and says that she is  tolerating it very well.  10/24/2019  [name] [name] returns today for follow-up.  I went ahead and discontinued her ezetimibe due to very low cholesterol.  She has had an expected increased from 116-150 total cholesterol, triglycerides 114, HDL 80 and LDL 50.  This still indicates excellent control.  She "loves" the Praluent.  She really enjoys giving herself shots.  In general her control has been excellent.  11/23/2020  [name] [name] is seen today in follow-up.  She continues to do well on Repatha.  I believe she was switched over from  Praluent due to insurance issues.  Her cholesterol is excellent with total 159, triglycerides 145, HDL 86 and LDL 49.  Blood pressure is also well controlled today.  Unfortunately she had a recent fall that led to pelvic fracture from which she is recovering.  PMHx:  Past Medical History:  Diagnosis Date  . Anemia    duering cancer treatment   . Anxiety   . Arthritis    OA hands   . Bilateral lower extremity pain   . Blood transfusion without reported diagnosis    during chemo for lung cancer  . Cataract    forming  but very small   . Cholelithiasis   . Colon polyps    adenomatous  . High cholesterol   . History of chemotherapy    for lung cancer- completed 2013  . Hx of radiation therapy 10/07/11 to 11/20/11   right lung  . Hx of radiation therapy 01/07/2012- 01/21/12   cranial irradiation  . Hypertension   . Lung cancer ([place]) 09/19/11   Stage III currently in remission  . Malignant melanoma of skin of canthus of right eye ([place]) 02/2017  . Osteoporosis 04/2014   Dexa scan by Dr. Corinna Capra, T-score -2.7, next in 2 years, with at least 13 % major fracture in 10 years  . Pitting edema    Bilateral lower extremities, duplex ultrasound 04/03/2015 normal, no DVT    Past Surgical History:  Procedure Laterality Date  . CHOLECYSTECTOMY  04/2011   biliary stent placement  . COLONOSCOPY    . [place]  2015  . MOHS SURGERY Right 02/2017   cheek and eyelid  . Josph Macho Touch     Vaginal procedure  . POLYPECTOMY    . TUBAL LIGATION      FAMHx:  Family History  Problem Relation Age of Onset  . Bladder Cancer Father   . Stroke Father   . Colon polyps Father   . Atrial fibrillation Mother   . Heart disease Mother        CHF  . Clotting disorder Mother        warfarin  . Colon polyps Mother   . Colon cancer Maternal Aunt 59  . Drug abuse Son   . Breast cancer Neg Hx   . Esophageal cancer Neg Hx   . Stomach cancer Neg Hx   . Rectal cancer Neg Hx     SOCHx:    reports that she quit smoking about 9 years ago. Her smoking use included cigarettes. She has never used smokeless tobacco. She reports current alcohol use. She reports that she does not use drugs.  ALLERGIES:  Allergies  Allergen Reactions  . Tramadol Hcl Nausea And Vomiting  . Codeine Nausea And Vomiting  . Penicillins Rash    ROS: Pertinent items noted in HPI and remainder of comprehensive ROS otherwise negative.  HOME MEDS: Current Outpatient Medications on File Prior to  Visit  Medication Sig Dispense Refill  . acetaminophen (TYLENOL) 325 MG tablet Take 650 mg by mouth every 6 (six) hours as needed for mild pain, moderate pain or fever.    . ALPRAZolam (XANAX) 0.5 MG tablet Take 1 tablet (0.5 mg total) by mouth daily as needed for anxiety.  0  . aspirin EC 81 MG tablet Take 81 mg daily by mouth.    . Cholecalciferol (VITAMIN D3) 2000 UNITS capsule Take 4,000 Units by mouth daily.     . citalopram (CELEXA) 20 MG tablet Take 1 tablet (20 mg total) by mouth daily.    . Cyanocobalamin 2500 MCG CHEW Chew 2 tablets by mouth daily.    . Evolocumab (REPATHA SURECLICK) 859 MG/ML SOAJ Inject 1 Dose into the skin every 14 (fourteen) days. 2 pen 11  . lisinopril (ZESTRIL) 2.5 MG tablet Take 1 tablet (2.5 mg total) by mouth daily. 30 tablet 0  . metoprolol tartrate (LOPRESSOR) 25 MG tablet Take 1 tablet (25 mg total) by mouth 2 (two) times daily. 60 tablet 1  . raloxifene (EVISTA) 60 MG tablet Take 1 tablet (60 mg total) by mouth daily. 90 tablet 3  . rosuvastatin (CRESTOR) 40 MG tablet Take 1 tablet (40 mg total) by mouth at bedtime. 90 tablet 1   No current facility-administered medications on file prior to visit.    LABS/IMAGING: No results found for this or any previous visit (from the past 48 hour(s)). No results found.  LIPID PANEL:    Component Value Date/Time   CHOL 159 11/20/2020 1001   TRIG 145 11/20/2020 1001   HDL 86 11/20/2020 1001   CHOLHDL 1.8 11/20/2020 1001   CHOLHDL  5 09/24/2017 0902   VLDL 31.2 09/24/2017 0902   LDLCALC 49 11/20/2020 1001   LDLDIRECT 282.7 12/03/2012 1138    WEIGHTS: Wt Readings from Last 3 Encounters:  11/23/20 153 lb 9.6 oz (69.7 kg)  09/13/20 159 lb 4.8 oz (72.3 kg)  08/11/20 161 lb 13.1 oz (73.4 kg)    VITALS: BP 119/81   Pulse 87   Ht 5\' 2"  (1.575 m)   Wt 153 lb 9.6 oz (69.7 kg)   SpO2 97%   BMI 28.09 kg/m   EXAM: Deferred  EKG: N/A  ASSESSMENT: 1. Extensive coronary artery calcification 2. Dyslipidemia not at goal LDL less than 70 3. Probable FH - Dutch score of 6 4. Atherogenic diet  PLAN: 1.   [name] [name] continues to have excellent control of her lipids now on Repatha.  Will make sure she continues to get prior authorization for this.  She did have extensive coronary calcification and should continue to be a target LDL less than 70.  I suspect she has probable FH based on a high Dutch score.  She has made some dietary changes as well.  I have encouraged continued physical activity.  Plan follow-up annually or sooner as necessary.  Pixie Casino, MD, [facility], [facility] Director of the Advanced Lipid Disorders &  Cardiovascular Risk Reduction Clinic Diplomate of the American Board of Clinical Lipidology Attending Cardiologist  Direct Dial: [phone]  Fax: [phone]  Website:  [email or web address] [email or web address] Japhet Morgenthaler 11/23/2020, 11:16 AM

## 2020-11-23 NOTE — Patient Instructions (Signed)
Medication Instructions:  Your physician recommends that you continue on your current medications as directed. Please refer to the Current Medication list given to you today.  Patient Assistance:  The [employer] foundation offers assistance to help pay for medication copays.  They will cover copays for all cholesterol lowering meds, including statins, fibrates, omega-3 oils, ezetimibe, Repatha, Praluent, Nexletol, Nexlizet.  The cards are usually good for $2,500 or 12 months, whichever comes first. 1. Go to healthwellfoundation.org 2. Click on "Apply Now" 3. Answer questions as to whom is applying (patient or representative) 4. Your disease fund will be "hypercholesterolemia - Medicare access" 5. They will ask questions about finances and which medications you are taking for cholesterol 6. When you submit, the approval is usually within minutes.  You will need to print the card information from the site 7. You will need to show this information to your pharmacy, they will bill your Medicare Part D plan first -then bill [employer] --for the copay.   You can also call them at [phone], although the hold times can be quite long.   Thank you for choosing [facility]    *If you need a refill on your cardiac medications before your next appointment, please call your pharmacy*   Lab Work: FASTING lab work in 1 year to check cholesterol   If you have labs (blood work) drawn today and your tests are completely normal, you will receive your results only by: Marland Kitchen MyChart Message (if you have MyChart) OR . A paper copy in the mail If you have any lab test that is abnormal or we need to change your treatment, we will call you to review the results.   Testing/Procedures: NONE   Follow-Up: At [facility], you and your health needs are our priority.  As part of our continuing mission to provide you with exceptional heart care, we have created designated Provider Care Teams.  These Care  Teams include your primary Cardiologist (physician) and Advanced Practice Providers (APPs -  Physician Assistants and Nurse Practitioners) who all work together to provide you with the care you need, when you need it.  We recommend signing up for the patient portal called "MyChart".  Sign up information is provided on this After Visit Summary.  MyChart is used to connect with patients for Virtual Visits (Telemedicine).  Patients are able to view lab/test results, encounter notes, upcoming appointments, etc.  Non-urgent messages can be sent to your provider as well.   To learn more about what you can do with MyChart, go to [email or web address].    Your next appointment:   12 month(s) - lipid clinic  The format for your next appointment:   In Person  Provider:   K. Mali Hilty, MD   Other Instructions

## 2020-12-03 DIAGNOSIS — H52223 Regular astigmatism, bilateral: Secondary | ICD-10-CM | POA: Diagnosis not present

## 2020-12-03 DIAGNOSIS — H2513 Age-related nuclear cataract, bilateral: Secondary | ICD-10-CM | POA: Diagnosis not present

## 2020-12-03 DIAGNOSIS — H43812 Vitreous degeneration, left eye: Secondary | ICD-10-CM | POA: Diagnosis not present

## 2020-12-03 DIAGNOSIS — H5203 Hypermetropia, bilateral: Secondary | ICD-10-CM | POA: Diagnosis not present

## 2020-12-03 DIAGNOSIS — H10413 Chronic giant papillary conjunctivitis, bilateral: Secondary | ICD-10-CM | POA: Diagnosis not present

## 2020-12-03 DIAGNOSIS — H524 Presbyopia: Secondary | ICD-10-CM | POA: Diagnosis not present

## 2020-12-04 ENCOUNTER — Ambulatory Visit: Payer: PRIVATE HEALTH INSURANCE | Admitting: Internal Medicine

## 2020-12-10 ENCOUNTER — Other Ambulatory Visit: Payer: Self-pay

## 2020-12-10 ENCOUNTER — Ambulatory Visit (INDEPENDENT_AMBULATORY_CARE_PROVIDER_SITE_OTHER): Payer: Medicare Other | Admitting: Internal Medicine

## 2020-12-10 ENCOUNTER — Ambulatory Visit: Payer: Medicare Other | Attending: Internal Medicine

## 2020-12-10 ENCOUNTER — Encounter: Payer: Self-pay | Admitting: Internal Medicine

## 2020-12-10 VITALS — BP 128/84 | HR 88 | Temp 98.6°F | Resp 16 | Ht 62.0 in | Wt 154.5 lb

## 2020-12-10 DIAGNOSIS — E559 Vitamin D deficiency, unspecified: Secondary | ICD-10-CM | POA: Diagnosis not present

## 2020-12-10 DIAGNOSIS — I10 Essential (primary) hypertension: Secondary | ICD-10-CM

## 2020-12-10 DIAGNOSIS — D62 Acute posthemorrhagic anemia: Secondary | ICD-10-CM | POA: Diagnosis not present

## 2020-12-10 DIAGNOSIS — R739 Hyperglycemia, unspecified: Secondary | ICD-10-CM | POA: Diagnosis not present

## 2020-12-10 DIAGNOSIS — E538 Deficiency of other specified B group vitamins: Secondary | ICD-10-CM

## 2020-12-10 DIAGNOSIS — F418 Other specified anxiety disorders: Secondary | ICD-10-CM

## 2020-12-10 DIAGNOSIS — I2510 Atherosclerotic heart disease of native coronary artery without angina pectoris: Secondary | ICD-10-CM | POA: Diagnosis not present

## 2020-12-10 DIAGNOSIS — Z23 Encounter for immunization: Secondary | ICD-10-CM

## 2020-12-10 DIAGNOSIS — M810 Age-related osteoporosis without current pathological fracture: Secondary | ICD-10-CM | POA: Diagnosis not present

## 2020-12-10 NOTE — Progress Notes (Signed)
   Covid-19 Vaccination Clinic  Name:  [name] [name]    MRN: [id number] DOB: [birth year]  12/10/2020  [name] was observed post Covid-19 immunization for 15 minutes without incident. She was provided with Vaccine Information Sheet and instruction to access the V-Safe system.   [name] was instructed to call 911 with any severe reactions post vaccine: Marland Kitchen Difficulty breathing  . Swelling of face and throat  . A fast heartbeat  . A bad rash all over body  . Dizziness and weakness   Immunizations Administered    Name Date Dose VIS Date Route   Moderna Covid-19 Booster Vaccine 12/10/2020  4:12 PM 0.25 mL 06/13/2020 Intramuscular   Manufacturer: Moderna   Lot: [id number]   [place]: [id number]

## 2020-12-10 NOTE — Patient Instructions (Signed)
Check the  blood pressure  weekly BP GOAL is between 110/65 and  135/85. If it is consistently higher or lower, let me know    GO TO THE LAB : Get the blood work     [place], [place] back for a checkup in 1 year

## 2020-12-10 NOTE — Progress Notes (Signed)
Subjective:    Patient ID: [name] [name], female    DOB: 1951/04/05, 70 y.o.   MRN: [id number]  DOS:  12/10/2020 Type of visit - description: Follow-up Since the last office visit, had a major fall, still having problems, to do physical therapy soon. Hypertension: Good med compliance, ambulatory BPs are very good History of melanoma: Has not seen dermatology recently   Review of Systems Denies chest pain or difficulty breathing No nausea or vomiting.     Past Medical History:  Diagnosis Date  . Anemia    duering cancer treatment   . Anxiety   . Arthritis    OA hands   . Bilateral lower extremity pain   . Blood transfusion without reported diagnosis    during chemo for lung cancer  . Cataract    forming  but very small   . Cholelithiasis   . Colon polyps    adenomatous  . High cholesterol   . History of chemotherapy    for lung cancer- completed 2013  . Hx of radiation therapy 10/07/11 to 11/20/11   right lung  . Hx of radiation therapy 01/07/2012- 01/21/12   cranial irradiation  . Hypertension   . Lung cancer ([place]) 09/19/11   Stage III currently in remission  . Malignant melanoma of skin of canthus of right eye ([place]) 02/2017  . Osteoporosis 04/2014   Dexa scan by Dr. Corinna Capra, T-score -2.7, next in 2 years, with at least 13 % major fracture in 10 years  . Pitting edema    Bilateral lower extremities, duplex ultrasound 04/03/2015 normal, no DVT    Past Surgical History:  Procedure Laterality Date  . CHOLECYSTECTOMY  04/2011   biliary stent placement  . COLONOSCOPY    . [address]  2015  . MOHS SURGERY Right 02/2017   cheek and eyelid  . Josph Macho Touch     Vaginal procedure  . POLYPECTOMY    . TUBAL LIGATION      Allergies as of 12/10/2020      Reactions   Tramadol Hcl Nausea And Vomiting   Codeine Nausea And Vomiting   Penicillins Rash      Medication List       Accurate as of December 10, 2020 11:59 PM. If you have any questions, ask  your nurse or doctor.        STOP taking these medications   ALPRAZolam 0.5 MG tablet Commonly known as: Duanne Moron Stopped by: Kathlene November, MD     TAKE these medications   acetaminophen 325 MG tablet Commonly known as: TYLENOL Take 650 mg by mouth every 6 (six) hours as needed for mild pain, moderate pain or fever.   aspirin EC 81 MG tablet Take 81 mg daily by mouth.   citalopram 20 MG tablet Commonly known as: CELEXA Take 1 tablet (20 mg total) by mouth daily.   Cyanocobalamin 2500 MCG Chew Chew 2 tablets by mouth daily.   lisinopril 2.5 MG tablet Commonly known as: ZESTRIL Take 1 tablet (2.5 mg total) by mouth daily.   metoprolol tartrate 25 MG tablet Commonly known as: LOPRESSOR Take 1 tablet (25 mg total) by mouth 2 (two) times daily.   raloxifene 60 MG tablet Commonly known as: EVISTA Take 1 tablet (60 mg total) by mouth daily.   Repatha SureClick 892 MG/ML Soaj Generic drug: Evolocumab Inject 1 Dose into the skin every 14 (fourteen) days.   rosuvastatin 40 MG tablet Commonly known as: CRESTOR Take  1 tablet (40 mg total) by mouth at bedtime.   Vitamin D3 50 MCG (2000 UT) capsule Take 4,000 Units by mouth daily.          Objective:   Physical Exam BP 128/84 (BP Location: Left Arm, Patient Position: Sitting, Cuff Size: Small)   Pulse 88   Temp 98.6 F (37 C) (Oral)   Resp 16   Ht 5\' 2"  (1.575 m)   Wt 154 lb 8 oz (70.1 kg)   SpO2 98%   BMI 28.26 kg/m  General:   Well developed, NAD, BMI noted. HEENT:  Normocephalic . Face symmetric, atraumatic Lungs:  CTA B Normal respiratory effort, no intercostal retractions, no accessory muscle use. Heart: RRR,  no murmur.  Lower extremities: no pretibial edema bilaterally  Skin: Not pale. Not jaundice Neurologic:  alert & oriented X3.  Speech normal, gait appropriate for age and unassisted Psych--  Cognition and judgment appear intact.  Cooperative with normal attention span and concentration.  Behavior  appropriate. No anxious or depressed appearing.      Assessment     Assessment   HTN Hyperlipidemia -- cramps with Lipitor dc 2015 Depression . Anxiety -- on citalopram, used xanax before, re start if needed Osteoporosis-- DEXAs  at gyn --->  was on boniva, now evista  B12 deficiency dx 01/2014 Vit D  def  COPD: Mild last PFTs  CV: Extensive coronary artery calcifications Oncology: -Lung cancer SCC DX 2013, XRT chest and brain -melanoma 2018, R face, s/p Mohs Lower extremity edema [place] (-) for DVT 03-2015 Coronary calcifications per CT chest 6 2016 IBS, diarrhea predominant   on Imodium as needed; GI 2015-- rx questran, self d/c d/t constipation ASA intolerant (tinnitus) but ok  taking low-dose Fall, 07/2021, admitted, L pubic rami fracture   PLAN: HTN: Ambulatory BPs 130/80 or better.  Continue lisinopril, metoprolol, check CMP. Hyperlipidemia: Well-controlled, follow-up at the lipid clinic Depression anxiety: Well-controlled on citalopram, does not use Xanax but okay to RF if needed in the future Osteoporosis: On Evista, sees gynecology, check TSH Hyperglycemia: Mild, noted on chart review, check A1c B12 deficiency: On supplements, checking labs. Vitamin D deficiency: Last lab very good. Fall, 07/2021: Admitted with pubic Ramey fracture, subsequently did physical therapy at home, still having issues, follow-up by Ortho, plans to do outpatient PT moving forward. Preventive care reviewed  RTC 1 year   This visit occurred during the SARS-CoV-2 public health emergency.  Safety protocols were in place, including screening questions prior to the visit, additional usage of staff PPE, and extensive cleaning of exam room while observing appropriate contact time as indicated for disinfecting solutions.

## 2020-12-11 LAB — CBC WITH DIFFERENTIAL/PLATELET
Basophils Absolute: 0.1 10*3/uL (ref 0.0–0.1)
Basophils Relative: 1.1 % (ref 0.0–3.0)
Eosinophils Absolute: 0.1 10*3/uL (ref 0.0–0.7)
Eosinophils Relative: 2.1 % (ref 0.0–5.0)
HCT: 37.4 % (ref 36.0–46.0)
Hemoglobin: 12.5 g/dL (ref 12.0–15.0)
Lymphocytes Relative: 12.8 % (ref 12.0–46.0)
Lymphs Abs: 0.9 10*3/uL (ref 0.7–4.0)
MCHC: 33.3 g/dL (ref 30.0–36.0)
MCV: 95.2 fl (ref 78.0–100.0)
Monocytes Absolute: 0.7 10*3/uL (ref 0.1–1.0)
Monocytes Relative: 10.2 % (ref 3.0–12.0)
Neutro Abs: 5 10*3/uL (ref 1.4–7.7)
Neutrophils Relative %: 73.8 % (ref 43.0–77.0)
Platelets: 211 10*3/uL (ref 150.0–400.0)
RBC: 3.93 Mil/uL (ref 3.87–5.11)
RDW: 13.6 % (ref 11.5–15.5)
WBC: 6.8 10*3/uL (ref 4.0–10.5)

## 2020-12-11 LAB — COMPREHENSIVE METABOLIC PANEL
ALT: 10 U/L (ref 0–35)
AST: 19 U/L (ref 0–37)
Albumin: 4.1 g/dL (ref 3.5–5.2)
Alkaline Phosphatase: 62 U/L (ref 39–117)
BUN: 19 mg/dL (ref 6–23)
CO2: 29 mEq/L (ref 19–32)
Calcium: 9.7 mg/dL (ref 8.4–10.5)
Chloride: 101 mEq/L (ref 96–112)
Creatinine, Ser: 1.03 mg/dL (ref 0.40–1.20)
GFR: 56.14 mL/min — ABNORMAL LOW (ref >60.00)
Glucose, Bld: 86 mg/dL (ref 70–99)
Potassium: 4.3 mEq/L (ref 3.5–5.1)
Sodium: 139 mEq/L (ref 135–145)
Total Bilirubin: 0.3 mg/dL (ref 0.2–1.2)
Total Protein: 6.6 g/dL (ref 6.0–8.3)

## 2020-12-11 LAB — B12 AND FOLATE PANEL
Folate: 7.7 ng/mL (ref >5.9)
Vitamin B-12: 386 pg/mL (ref 211–911)

## 2020-12-11 LAB — FERRITIN: Ferritin: 109.7 ng/mL (ref 10.0–291.0)

## 2020-12-11 LAB — TSH: TSH: 3.62 u[IU]/mL (ref 0.35–4.50)

## 2020-12-11 LAB — HEMOGLOBIN A1C: Hgb A1c MFr Bld: 5.5 % (ref 4.6–6.5)

## 2020-12-11 LAB — IRON: Iron: 116 ug/dL (ref 42–145)

## 2020-12-11 NOTE — Assessment & Plan Note (Signed)
Preventive care reviewed -Td shot 2019 - s/p zostavax ; Shingrix x1, rec to proceed with Shingrix No. 2 - pnm 23: 2003 and 06-2017; prevnar 2016 -COVID VAX x3, booster discussed and recommended -Sees gyn, [facility] November 2021. -CCS: colonoscopy in 2007 @ [facility] ([place] 2 polyps Colonoscopy 05-2013 @ [place], + polyps, C-scope 09/08/2019, next per GI -Lifestyle: Unable to be active due to pain, she admits to drinking too much, recommend no more than 1 serving of alcohol at night, she was receptive to my advice.

## 2020-12-11 NOTE — Assessment & Plan Note (Signed)
HTN: Ambulatory BPs 130/80 or better.  Continue lisinopril, metoprolol, check CMP. Hyperlipidemia: Well-controlled, follow-up at the lipid clinic Depression anxiety: Well-controlled on citalopram, does not use Xanax but okay to RF if needed in the future Osteoporosis: On Evista, sees gynecology, check TSH Hyperglycemia: Mild, noted on chart review, check A1c B12 deficiency: On supplements, checking labs. Vitamin D deficiency: Last lab very good. Fall, 07/2021: Admitted with pubic Ramey fracture, subsequently did physical therapy at home, still having issues, follow-up by Ortho, plans to do outpatient PT moving forward. Preventive care reviewed  RTC 1 year

## 2020-12-13 ENCOUNTER — Other Ambulatory Visit (HOSPITAL_BASED_OUTPATIENT_CLINIC_OR_DEPARTMENT_OTHER): Payer: Self-pay

## 2020-12-13 DIAGNOSIS — M545 Low back pain, unspecified: Secondary | ICD-10-CM | POA: Diagnosis not present

## 2020-12-13 DIAGNOSIS — M6281 Muscle weakness (generalized): Secondary | ICD-10-CM | POA: Diagnosis not present

## 2020-12-13 DIAGNOSIS — Z23 Encounter for immunization: Secondary | ICD-10-CM | POA: Diagnosis not present

## 2020-12-13 MED ORDER — MODERNA COVID-19 VACCINE 100 MCG/0.5ML IM SUSP
INTRAMUSCULAR | 0 refills | Status: DC
  Filled 2020-12-13: qty 0.25, 1d supply, fill #0

## 2020-12-14 ENCOUNTER — Other Ambulatory Visit (HOSPITAL_BASED_OUTPATIENT_CLINIC_OR_DEPARTMENT_OTHER): Payer: Self-pay

## 2020-12-19 DIAGNOSIS — M545 Low back pain, unspecified: Secondary | ICD-10-CM | POA: Diagnosis not present

## 2020-12-19 DIAGNOSIS — M6281 Muscle weakness (generalized): Secondary | ICD-10-CM | POA: Diagnosis not present

## 2020-12-25 DIAGNOSIS — M6281 Muscle weakness (generalized): Secondary | ICD-10-CM | POA: Diagnosis not present

## 2020-12-25 DIAGNOSIS — M545 Low back pain, unspecified: Secondary | ICD-10-CM | POA: Diagnosis not present

## 2020-12-27 DIAGNOSIS — M6281 Muscle weakness (generalized): Secondary | ICD-10-CM | POA: Diagnosis not present

## 2020-12-27 DIAGNOSIS — M545 Low back pain, unspecified: Secondary | ICD-10-CM | POA: Diagnosis not present

## 2021-01-02 DIAGNOSIS — M6281 Muscle weakness (generalized): Secondary | ICD-10-CM | POA: Diagnosis not present

## 2021-01-02 DIAGNOSIS — M545 Low back pain, unspecified: Secondary | ICD-10-CM | POA: Diagnosis not present

## 2021-01-04 DIAGNOSIS — M545 Low back pain, unspecified: Secondary | ICD-10-CM | POA: Diagnosis not present

## 2021-01-04 DIAGNOSIS — M6281 Muscle weakness (generalized): Secondary | ICD-10-CM | POA: Diagnosis not present

## 2021-01-09 DIAGNOSIS — M545 Low back pain, unspecified: Secondary | ICD-10-CM | POA: Diagnosis not present

## 2021-01-09 DIAGNOSIS — M6281 Muscle weakness (generalized): Secondary | ICD-10-CM | POA: Diagnosis not present

## 2021-01-16 DIAGNOSIS — M545 Low back pain, unspecified: Secondary | ICD-10-CM | POA: Diagnosis not present

## 2021-01-16 DIAGNOSIS — M6281 Muscle weakness (generalized): Secondary | ICD-10-CM | POA: Diagnosis not present

## 2021-01-22 ENCOUNTER — Other Ambulatory Visit: Payer: Self-pay

## 2021-01-22 ENCOUNTER — Inpatient Hospital Stay: Payer: Medicare Other | Attending: Internal Medicine

## 2021-01-22 DIAGNOSIS — Z923 Personal history of irradiation: Secondary | ICD-10-CM | POA: Diagnosis not present

## 2021-01-22 DIAGNOSIS — C3490 Malignant neoplasm of unspecified part of unspecified bronchus or lung: Secondary | ICD-10-CM

## 2021-01-22 DIAGNOSIS — Z9221 Personal history of antineoplastic chemotherapy: Secondary | ICD-10-CM | POA: Diagnosis not present

## 2021-01-22 DIAGNOSIS — Z85118 Personal history of other malignant neoplasm of bronchus and lung: Secondary | ICD-10-CM | POA: Diagnosis not present

## 2021-01-22 LAB — CBC WITH DIFFERENTIAL (CANCER CENTER ONLY)
Abs Immature Granulocytes: 0.01 10*3/uL (ref 0.00–0.07)
Basophils Absolute: 0 10*3/uL (ref 0.0–0.1)
Basophils Relative: 1 %
Eosinophils Absolute: 0.1 10*3/uL (ref 0.0–0.5)
Eosinophils Relative: 2 %
HCT: 37.9 % (ref 36.0–46.0)
Hemoglobin: 12.9 g/dL (ref 12.0–15.0)
Immature Granulocytes: 0 %
Lymphocytes Relative: 13 %
Lymphs Abs: 0.7 10*3/uL (ref 0.7–4.0)
MCH: 32 pg (ref 26.0–34.0)
MCHC: 34 g/dL (ref 30.0–36.0)
MCV: 94 fL (ref 80.0–100.0)
Monocytes Absolute: 0.5 10*3/uL (ref 0.1–1.0)
Monocytes Relative: 9 %
Neutro Abs: 4.2 10*3/uL (ref 1.7–7.7)
Neutrophils Relative %: 75 %
Platelet Count: 198 10*3/uL (ref 150–400)
RBC: 4.03 MIL/uL (ref 3.87–5.11)
RDW: 11.9 % (ref 11.5–15.5)
WBC Count: 5.5 10*3/uL (ref 4.0–10.5)
nRBC: 0 % (ref 0.0–0.2)

## 2021-01-22 LAB — CMP (CANCER CENTER ONLY)
ALT: 15 U/L (ref 0–44)
AST: 26 U/L (ref 15–41)
Albumin: 3.9 g/dL (ref 3.5–5.0)
Alkaline Phosphatase: 59 U/L (ref 38–126)
Anion gap: 13 (ref 5–15)
BUN: 13 mg/dL (ref 8–23)
CO2: 22 mmol/L (ref 22–32)
Calcium: 9.7 mg/dL (ref 8.9–10.3)
Chloride: 104 mmol/L (ref 98–111)
Creatinine: 0.91 mg/dL (ref 0.44–1.00)
GFR, Estimated: >60 mL/min (ref >60)
Glucose, Bld: 93 mg/dL (ref 70–99)
Potassium: 4.1 mmol/L (ref 3.5–5.1)
Sodium: 139 mmol/L (ref 135–145)
Total Bilirubin: 0.3 mg/dL (ref 0.3–1.2)
Total Protein: 7.2 g/dL (ref 6.5–8.1)

## 2021-01-23 DIAGNOSIS — M545 Low back pain, unspecified: Secondary | ICD-10-CM | POA: Diagnosis not present

## 2021-01-23 DIAGNOSIS — M6281 Muscle weakness (generalized): Secondary | ICD-10-CM | POA: Diagnosis not present

## 2021-01-24 ENCOUNTER — Telehealth (HOSPITAL_COMMUNITY): Payer: Self-pay

## 2021-01-24 ENCOUNTER — Telehealth: Payer: Self-pay | Admitting: Internal Medicine

## 2021-01-24 NOTE — Telephone Encounter (Signed)
Pt notified number to call to schedule CT and to call with that appt so we can schedule with [facility].  Labs done 05/31.

## 2021-01-24 NOTE — Telephone Encounter (Signed)
R/s appt per 6/2 sch msg. Called pt, no answer. Voicemail was full. Mailed updated calendar to pt.

## 2021-01-28 ENCOUNTER — Other Ambulatory Visit: Payer: Self-pay | Admitting: Internal Medicine

## 2021-01-29 ENCOUNTER — Ambulatory Visit (HOSPITAL_COMMUNITY)
Admission: RE | Admit: 2021-01-29 | Discharge: 2021-01-29 | Disposition: A | Discharge status: Home / Self Care | Payer: Medicare Other | Source: Ambulatory Visit | Attending: Internal Medicine | Admitting: Internal Medicine

## 2021-01-29 ENCOUNTER — Other Ambulatory Visit: Payer: Self-pay

## 2021-01-29 ENCOUNTER — Inpatient Hospital Stay: Payer: Medicare Other | Admitting: Internal Medicine

## 2021-01-29 DIAGNOSIS — C3490 Malignant neoplasm of unspecified part of unspecified bronchus or lung: Secondary | ICD-10-CM | POA: Diagnosis not present

## 2021-01-29 DIAGNOSIS — Z85820 Personal history of malignant melanoma of skin: Secondary | ICD-10-CM | POA: Diagnosis not present

## 2021-01-29 DIAGNOSIS — I2510 Atherosclerotic heart disease of native coronary artery without angina pectoris: Secondary | ICD-10-CM | POA: Diagnosis not present

## 2021-01-29 DIAGNOSIS — J8410 Pulmonary fibrosis, unspecified: Secondary | ICD-10-CM | POA: Diagnosis not present

## 2021-01-30 ENCOUNTER — Telehealth: Payer: Self-pay | Admitting: Medical Oncology

## 2021-01-30 NOTE — Progress Notes (Signed)
[facility] OFFICE PROGRESS NOTE  Colon Branch, MD [address] [place] [address]  DIAGNOSIS: Limited stage small cell lung cancer diagnosed in January of 2013   PRIOR THERAPY: 1) Systemic chemotherapy with cisplatin at 60 mg per meter square given on day 1 and etoposide at 120 mg per meter square given on days one 2 and 3 with Neulasta support given on day 4 status post 4 cycles, last cycle was given on 12/02/2011 . This with concurrent with radiotherapy under the care of Dr. Lisbeth Renshaw.  2) prophylactic cranial irradiation under the care of Dr. Lisbeth Renshaw completed on 01/21/2012   CURRENT THERAPY: Observation  INTERVAL HISTORY: [name] [name] 70 y.o. female returns to the clinic for a follow up visit accompanied by her sister. The patient is feeling well today without any concerning complaints. The patient had been on observation since 2013. Denies any fever, chills, night sweats, or weight loss. Denies any chest pain, shortness of breath, or hemoptysis. She has a baseline cough which she thinks is from lisinopril. Denies any nausea, vomiting, diarrhea, or constipation. Denies any headache or visual changes except for cataracts. Denies any rashes or skin changes. The patient recently had a restaging CT scan performed. The patient is here today for evaluation and to review her scan results.   MEDICAL HISTORY: Past Medical History:  Diagnosis Date   Anemia    duering cancer treatment    Anxiety    Arthritis    OA hands    Bilateral lower extremity pain    Blood transfusion without reported diagnosis    during chemo for lung cancer   Cataract    forming  but very small    Cholelithiasis    Colon polyps    adenomatous   High cholesterol    History of chemotherapy    for lung cancer- completed 2013   Hx of radiation therapy 10/07/11 to 11/20/11   right lung   Hx of radiation therapy 01/07/2012- 01/21/12   cranial irradiation   Hypertension    Lung  cancer ([facility]) 09/19/11   Stage III currently in remission   Malignant melanoma of skin of canthus of right eye ([facility]) 02/2017   Osteoporosis 04/2014   Dexa scan by Dr. Corinna Capra, T-score -2.7, next in 2 years, with at least 13 % major fracture in 10 years   Pitting edema    Bilateral lower extremities, duplex ultrasound 04/03/2015 normal, no DVT    ALLERGIES:  is allergic to mosquito (diagnostic), tramadol hcl, codeine, and penicillins.  MEDICATIONS:  Current Outpatient Medications  Medication Sig Dispense Refill   acetaminophen (TYLENOL) 325 MG tablet Take 650 mg by mouth every 6 (six) hours as needed for mild pain, moderate pain or fever.     aspirin EC 81 MG tablet Take 81 mg daily by mouth.     Cholecalciferol (VITAMIN D3) 2000 UNITS capsule Take 4,000 Units by mouth daily.      citalopram (CELEXA) 20 MG tablet Take 1 tablet (20 mg total) by mouth daily.     COVID-19 mRNA vaccine, Moderna, (MODERNA COVID-19 VACCINE) 100 MCG/0.5ML injection Inject into the muscle. 0.25 mL 0   Cyanocobalamin 2500 MCG CHEW Chew 2 tablets by mouth daily.     lisinopril (ZESTRIL) 2.5 MG tablet Take 1 tablet (2.5 mg total) by mouth daily. 30 tablet 0   metoprolol tartrate (LOPRESSOR) 25 MG tablet Take 1 tablet (25 mg total) by mouth 2 (two) times  daily. 60 tablet 1   ondansetron (ZOFRAN-ODT) 8 MG disintegrating tablet      raloxifene (EVISTA) 60 MG tablet Take 1 tablet (60 mg total) by mouth daily. 90 tablet 3   REPATHA SURECLICK 496 MG/ML SOAJ INJECT 140MG  (1 PEN)  INTO THE SKIN EVERY 14 DAYS 6 mL 11   rosuvastatin (CRESTOR) 40 MG tablet Take 1 tablet (40 mg total) by mouth at bedtime. 90 tablet 1   No current facility-administered medications for this visit.    SURGICAL HISTORY:  Past Surgical History:  Procedure Laterality Date   CHOLECYSTECTOMY  04/2011   biliary stent placement   COLONOSCOPY     INCISIONAL HERNIA REPAIR  2015   MOHS SURGERY Right 02/2017   cheek and eyelid   Josph [place] Touch     Vaginal  procedure   POLYPECTOMY     TUBAL LIGATION      REVIEW OF SYSTEMS:   Review of Systems  Constitutional: Negative for appetite change, chills, fatigue, fever and unexpected weight change.  HENT: Negative for mouth sores, nosebleeds, sore throat and trouble swallowing.   Eyes: Negative for eye problems and icterus.  Respiratory: Positive for cough. Negative for hemoptysis, shortness of breath and wheezing.   Cardiovascular: Negative for chest pain and leg swelling.  Gastrointestinal: Negative for abdominal pain, constipation, diarrhea, nausea and vomiting.  Genitourinary: Negative for bladder incontinence, difficulty urinating, dysuria, frequency and hematuria.   Musculoskeletal: Negative for back pain, gait problem, neck pain and neck stiffness.  Skin: Negative for itching and rash.  Neurological: Negative for dizziness, extremity weakness, gait problem, headaches, light-headedness and seizures.  Hematological: Negative for adenopathy. Does not bruise/bleed easily.  Psychiatric/Behavioral: Negative for confusion, depression and sleep disturbance. The patient is not nervous/anxious.     PHYSICAL EXAMINATION:  Blood pressure 132/83, pulse 92, temperature 98.6 F (37 C), temperature source Tympanic, height 5\' 2"  (1.575 m), weight 157 lb 12.8 oz (71.6 kg), SpO2 98 %.  ECOG PERFORMANCE STATUS: 0 - Asymptomatic  Physical Exam  Constitutional: Oriented to person, place, and time and well-developed, well-nourished, and in no distress.  HENT:  Head: Normocephalic and atraumatic.  Mouth/Throat: Oropharynx is clear and moist. No oropharyngeal exudate.  Eyes: Conjunctivae are normal. Right eye exhibits no discharge. Left eye exhibits no discharge. No scleral icterus.  Neck: Normal range of motion. Neck supple.  Cardiovascular: Normal rate, regular rhythm, normal heart sounds and intact distal pulses.   Pulmonary/Chest: Effort normal and breath sounds normal. No respiratory distress. No wheezes.  No rales.  Abdominal: Soft. Bowel sounds are normal. Exhibits no distension and no mass. There is no tenderness.  Musculoskeletal: Normal range of motion. Exhibits no edema.  Lymphadenopathy:    No cervical adenopathy.  Neurological: Alert and oriented to person, place, and time. Exhibits normal muscle tone. Gait normal. Coordination normal.  Skin: Skin is warm and dry. No rash noted. Not diaphoretic. No erythema. No pallor.  Psychiatric: Mood, memory and judgment normal.  Vitals reviewed.  LABORATORY DATA: Lab Results  Component Value Date   WBC 5.5 01/22/2021   HGB 12.9 01/22/2021   HCT 37.9 01/22/2021   MCV 94.0 01/22/2021   PLT 198 01/22/2021      Chemistry      Component Value Date/Time   NA 139 01/22/2021 1152   NA 143 02/05/2017 0817   K 4.1 01/22/2021 1152   K 4.3 02/05/2017 0817   CL 104 01/22/2021 1152   CL 104 12/23/2012 1338   CO2 22  01/22/2021 1152   CO2 25 02/05/2017 0817   BUN 13 01/22/2021 1152   BUN 16.0 02/05/2017 0817   CREATININE 0.91 01/22/2021 1152   CREATININE 1.0 02/05/2017 0817      Component Value Date/Time   CALCIUM 9.7 01/22/2021 1152   CALCIUM 9.5 02/05/2017 0817   ALKPHOS 59 01/22/2021 1152   ALKPHOS 48 02/05/2017 0817   AST 26 01/22/2021 1152   AST 21 02/05/2017 0817   ALT 15 01/22/2021 1152   ALT 12 02/05/2017 0817   BILITOT 0.3 01/22/2021 1152   BILITOT 0.31 02/05/2017 0817       RADIOGRAPHIC STUDIES:  CT CHEST WO CONTRAST  Result Date: 01/30/2021 CLINICAL DATA:  Primary Cancer Type: Lung Imaging Indication: Routine surveillance Interval therapy since last imaging? No Initial Cancer Diagnosis Date: 09/19/2011; Established by: Biopsy-proven Detailed Pathology: Stage IIIB small cell lung cancer. Primary Tumor location:   Right suprahilar mass. Surgeries: Cholecystecomy. Chemotherapy: Yes; Ongoing? No; Most recent administration: 12/02/2011 Immunotherapy? No Radiation therapy? Yes; Date Range: 10/07/2011-11/20/2011; Target: Right  lung Other Cancers: History of malignant melanoma. EXAM: CT CHEST WITHOUT CONTRAST TECHNIQUE: Multidetector CT imaging of the chest was performed following the standard protocol without IV contrast. COMPARISON:  Most recent CT chest 02/03/2020.  05/13/2013 PET-CT. FINDINGS: Cardiovascular: Normal heart size. No pericardial effusion. Aortic atherosclerosis and coronary artery calcifications. Mediastinum/Nodes: No enlarged mediastinal or axillary lymph nodes. Thyroid gland, trachea, and esophagus demonstrate no significant findings. Lungs/Pleura: No pleural effusion, airspace consolidation, or atelectasis. Geographic distribution architectural distortion and fibrosis within the superior segment of right lower lobe, right upper lobe and perihilar right lung is again noted and appears unchanged from the previous exam Upper Abdomen: No acute abnormality.  Cholecystectomy. Musculoskeletal: No acute or suspicious osseous findings. Remote healed right lateral rib fracture IMPRESSION: 1. Stable appearance of the chest. No findings identified to suggest local tumor recurrence or metastatic disease. 2. Aortic Atherosclerosis (ICD10-I70.0). Coronary artery calcifications. Electronically Signed   By: Kerby Moors M.D.   On: 01/30/2021 10:38      ASSESSMENT/PLAN:  This is a very pleasant 70 years old Caucasian female with diagnosed with limited stage small cell lung cancer in 2013.   She is status post systemic chemotherapy with cisplatin and etoposide concurrent with radiation and followed by prophylactic cranial irradiation completed in May 2013. She has been on observation since that time and the patient is feeling   The patient recently had a restaging CT scan performed. The patient was seen with Dr. Julien Nordmann. Dr. Julien Nordmann personally and independently reviewed the scan and discussed the results with the patient. The scan did not show any evidence for disease progression.   Dr. Julien Nordmann recommends that the patient  continue on observation with a restaging CT scan in 1 year.   The patient was advised to call immediately if she has any concerning symptoms in the interval. The patient voices understanding of current disease status and treatment options and is in agreement with the current care plan. All questions were answered. The patient knows to call the clinic with any problems, questions or concerns. We can certainly see the patient much sooner if necessary    Orders Placed This Encounter  Procedures   CT Chest W Contrast    Standing Status:   Future    Standing Expiration Date:   01/31/2022    Order Specific Question:   If indicated for the ordered procedure, I authorize the administration of contrast media per Radiology protocol    Answer:  Yes    Order Specific Question:   Preferred imaging location?    Answer:   [facility]   CBC with Differential ([place] Only)    Standing Status:   Future    Standing Expiration Date:   01/31/2022   CMP ([facility] only)    Standing Status:   Future    Standing Expiration Date:   01/31/2022      Tobe Sos Aldeen Riga, PA-C 01/31/21  ADDENDUM: Hematology/Oncology Attending: I had a face-to-face encounter with the patient.  I recommended her care plan.  This is a very pleasant 70 years old white female with limited stage small cell lung cancer diagnosed in 2013 status post systemic chemotherapy with cisplatin and etoposide concurrent with radiation followed by prophylactic cranial irradiation.  She has been in observation since that time and she is doing fine with no concerning complaints. She had repeat CT scan of the chest without contrast performed recently.  I personally and independently reviewed the scans and discussed the results with the patient and her sister. Her scan showed no concerning findings for disease recurrence or metastasis. I recommended for her to continue on observation with repeat CT scan of the chest in 1  year. The patient was advised to call immediately if she has any other concerning symptoms in the interval.  Disclaimer: This note was dictated with voice recognition software. Similar sounding words can inadvertently be transcribed and may be missed upon review. Eilleen Kempf, MD 02/01/21

## 2021-01-30 NOTE — Telephone Encounter (Signed)
Concerned her scan was done without contrast -is that okay?  I told her Julien Nordmann ordered without contrast due to contrast dye shortage.  Moved her appt up to tomorrow

## 2021-01-31 ENCOUNTER — Inpatient Hospital Stay: Payer: Medicare Other | Attending: Physician Assistant | Admitting: Physician Assistant

## 2021-01-31 ENCOUNTER — Encounter: Payer: Self-pay | Admitting: Physician Assistant

## 2021-01-31 ENCOUNTER — Other Ambulatory Visit: Payer: Self-pay

## 2021-01-31 VITALS — BP 132/83 | HR 92 | Temp 98.6°F | Ht 62.0 in | Wt 157.8 lb

## 2021-01-31 DIAGNOSIS — C3490 Malignant neoplasm of unspecified part of unspecified bronchus or lung: Secondary | ICD-10-CM

## 2021-01-31 DIAGNOSIS — Z9221 Personal history of antineoplastic chemotherapy: Secondary | ICD-10-CM | POA: Insufficient documentation

## 2021-01-31 DIAGNOSIS — M545 Low back pain, unspecified: Secondary | ICD-10-CM | POA: Diagnosis not present

## 2021-01-31 DIAGNOSIS — Z85118 Personal history of other malignant neoplasm of bronchus and lung: Secondary | ICD-10-CM | POA: Insufficient documentation

## 2021-01-31 DIAGNOSIS — M6281 Muscle weakness (generalized): Secondary | ICD-10-CM | POA: Diagnosis not present

## 2021-01-31 DIAGNOSIS — Z923 Personal history of irradiation: Secondary | ICD-10-CM | POA: Diagnosis not present

## 2021-02-04 ENCOUNTER — Other Ambulatory Visit: Payer: Self-pay | Admitting: Internal Medicine

## 2021-02-05 ENCOUNTER — Ambulatory Visit: Payer: Medicare Other | Admitting: Physician Assistant

## 2021-02-05 ENCOUNTER — Ambulatory Visit: Payer: Medicare Other | Admitting: Internal Medicine

## 2021-02-05 ENCOUNTER — Telehealth: Payer: Self-pay | Admitting: Physician Assistant

## 2021-02-05 NOTE — Telephone Encounter (Signed)
Scheduled per los. Called and left msg. Mailed printout  °

## 2021-02-14 DIAGNOSIS — H2513 Age-related nuclear cataract, bilateral: Secondary | ICD-10-CM | POA: Diagnosis not present

## 2021-02-14 DIAGNOSIS — G43819 Other migraine, intractable, without status migrainosus: Secondary | ICD-10-CM | POA: Diagnosis not present

## 2021-02-14 DIAGNOSIS — H10413 Chronic giant papillary conjunctivitis, bilateral: Secondary | ICD-10-CM | POA: Diagnosis not present

## 2021-02-14 DIAGNOSIS — H43812 Vitreous degeneration, left eye: Secondary | ICD-10-CM | POA: Diagnosis not present

## 2021-02-18 DIAGNOSIS — M545 Low back pain, unspecified: Secondary | ICD-10-CM | POA: Diagnosis not present

## 2021-02-18 DIAGNOSIS — M6281 Muscle weakness (generalized): Secondary | ICD-10-CM | POA: Diagnosis not present

## 2021-02-26 DIAGNOSIS — M545 Low back pain, unspecified: Secondary | ICD-10-CM | POA: Diagnosis not present

## 2021-02-26 DIAGNOSIS — M6281 Muscle weakness (generalized): Secondary | ICD-10-CM | POA: Diagnosis not present

## 2021-03-06 DIAGNOSIS — M545 Low back pain, unspecified: Secondary | ICD-10-CM | POA: Diagnosis not present

## 2021-03-06 DIAGNOSIS — M6281 Muscle weakness (generalized): Secondary | ICD-10-CM | POA: Diagnosis not present

## 2021-03-18 DIAGNOSIS — M6281 Muscle weakness (generalized): Secondary | ICD-10-CM | POA: Diagnosis not present

## 2021-03-18 DIAGNOSIS — M545 Low back pain, unspecified: Secondary | ICD-10-CM | POA: Diagnosis not present

## 2021-03-25 DIAGNOSIS — M545 Low back pain, unspecified: Secondary | ICD-10-CM | POA: Diagnosis not present

## 2021-03-25 DIAGNOSIS — M6281 Muscle weakness (generalized): Secondary | ICD-10-CM | POA: Diagnosis not present

## 2021-04-11 ENCOUNTER — Other Ambulatory Visit: Payer: Self-pay

## 2021-04-11 MED ORDER — LISINOPRIL 2.5 MG PO TABS: 2.5000 mg | ORAL_TABLET | Freq: Every day | ORAL | 3 refills | Status: DC

## 2021-04-17 ENCOUNTER — Telehealth: Payer: Self-pay

## 2021-04-17 ENCOUNTER — Ambulatory Visit: Payer: Medicare Other

## 2021-04-17 NOTE — Progress Notes (Unsigned)
Subjective:   [name] [name] is a 70 y.o. female who presents for Medicare Annual (Subsequent) preventive examination.  I connected with *** today by telephone and verified that I am speaking with the correct person using two identifiers. Location patient: home Location provider: work Persons participating in the virtual visit: patient, Marine scientist.    I discussed the limitations, risks, security and privacy concerns of performing an evaluation and management service by telephone and the availability of in person appointments. I also discussed with the patient that there may be a patient responsible charge related to this service. The patient expressed understanding and verbally consented to this telephonic visit.    Interactive audio and video telecommunications were attempted between this provider and patient, however failed, due to patient having technical difficulties OR patient did not have access to video capability.  We continued and completed visit with audio only.  Some vital signs may be absent or patient reported.   Time Spent with patient on telephone encounter: *** minutes   Review of Systems    ***       Objective:    There were no vitals filed for this visit. There is no height or weight on file to calculate BMI.  Advanced Directives 01/31/2021 08/03/2020 07/30/2020 08/11/2019 02/08/2018 10/15/2017 08/27/2017  Does Patient Have a Medical Advance Directive? No Yes No Yes No No No  Type of Advance Directive - Jud;Living will - - -  Does patient want to make changes to medical advance directive? - No - Patient declined - No - Patient declined - - -  Copy of [place] in Chart? - No - copy requested - No - copy requested - - -  Would patient like information on creating a medical advance directive? No - Patient declined No - Patient declined No - Patient declined - - No - Patient declined No -  Patient declined  Pre-existing out of facility DNR order (yellow form or pink MOST form) - - - - - - -    Current Medications (verified) Outpatient Encounter Medications as of 04/17/2021  Medication Sig   acetaminophen (TYLENOL) 325 MG tablet Take 650 mg by mouth every 6 (six) hours as needed for mild pain, moderate pain or fever.   aspirin EC 81 MG tablet Take 81 mg daily by mouth.   Cholecalciferol (VITAMIN D3) 2000 UNITS capsule Take 4,000 Units by mouth daily.    citalopram (CELEXA) 20 MG tablet Take 1 tablet (20 mg total) by mouth daily.   COVID-19 mRNA vaccine, Moderna, (MODERNA COVID-19 VACCINE) 100 MCG/0.5ML injection Inject into the muscle.   Cyanocobalamin 2500 MCG CHEW Chew 2 tablets by mouth daily.   lisinopril (ZESTRIL) 2.5 MG tablet Take 1 tablet (2.5 mg total) by mouth daily.   metoprolol tartrate (LOPRESSOR) 25 MG tablet Take 1 tablet (25 mg total) by mouth 2 (two) times daily.   ondansetron (ZOFRAN-ODT) 8 MG disintegrating tablet    raloxifene (EVISTA) 60 MG tablet Take 1 tablet (60 mg total) by mouth daily.   REPATHA SURECLICK 277 MG/ML SOAJ INJECT 140 MG( 1 ML) UNDER THE SKIN EVERY 14 DAYS   rosuvastatin (CRESTOR) 40 MG tablet Take 1 tablet (40 mg total) by mouth at bedtime.   No facility-administered encounter medications on file as of 04/17/2021.    Allergies (verified) Mosquito (diagnostic), Tramadol hcl, Codeine, and Penicillins   History: Past Medical History:  Diagnosis Date   Anemia  duering cancer treatment    Anxiety    Arthritis    OA hands    Bilateral lower extremity pain    Blood transfusion without reported diagnosis    during chemo for lung cancer   Cataract    forming  but very small    Cholelithiasis    Colon polyps    adenomatous   High cholesterol    History of chemotherapy    for lung cancer- completed 2013   Hx of radiation therapy 10/07/11 to 11/20/11   right lung   Hx of radiation therapy 01/07/2012- 01/21/12   cranial irradiation    Hypertension    Lung cancer ([place]) 09/19/11   Stage III currently in remission   Malignant melanoma of skin of canthus of right eye ([place]) 02/2017   Osteoporosis 04/2014   Dexa scan by Dr. Corinna Capra, T-score -2.7, next in 2 years, with at least 13 % major fracture in 10 years   Pitting edema    Bilateral lower extremities, duplex ultrasound 04/03/2015 normal, no DVT   Past Surgical History:  Procedure Laterality Date   CHOLECYSTECTOMY  04/2011   biliary stent placement   COLONOSCOPY     INCISIONAL HERNIA REPAIR  2015   MOHS SURGERY Right 02/2017   cheek and eyelid   Josph Macho Touch     Vaginal procedure   POLYPECTOMY     TUBAL LIGATION     Family History  Problem Relation Age of Onset   Bladder Cancer Father    Stroke Father    Colon polyps Father    Atrial fibrillation Mother    Heart disease Mother        CHF   Clotting disorder Mother        warfarin   Colon polyps Mother    Colon cancer Maternal Aunt 70   Drug abuse Son    Breast cancer Neg Hx    Esophageal cancer Neg Hx    Stomach cancer Neg Hx    Rectal cancer Neg Hx    Social History   Socioeconomic History   Marital status: Divorced    Spouse name: Not on file   Number of children: 2   Years of education: Not on file   Highest education level: Not on file  Occupational History   Occupation: retired, Scientist, research (medical)  Tobacco Use   Smoking status: Former    Types: Cigarettes    Quit date: 09/08/2011    Years since quitting: 9.6   Smokeless tobacco: Never  Substance and Sexual Activity   Alcohol use: Yes    Comment: occasional   Drug use: No   Sexual activity: Not on file  Other Topics Concern   Not on file  Social History Narrative   Divorced   Lost 1 child June 2019 (drugs)   Living child w/ drug addiction   Lives by herself       Social Determinants of Health   Financial Resource Strain: Not on file  Food Insecurity: Not on file  Transportation Needs: Not on file  Physical Activity: Not on file   Stress: Not on file  Social Connections: Not on file    Tobacco Counseling Counseling given: Not Answered   Clinical Intake:                 Diabetic?NO         Activities of Daily Living In your present state of health, do you have any difficulty performing the following activities:  08/13/2020 08/03/2020  Hearing? - N  Vision? - N  Difficulty concentrating or making decisions? - N  Walking or climbing stairs? - Y  Dressing or bathing? - Y  Doing errands, shopping? N -  Some recent data might be hidden    Patient Care Team: Colon Branch, MD as PCP - General Louretta Shorten, MD as Consulting Physician (Obstetrics and Gynecology) Debara Pickett Nadean Corwin, MD as Consulting Physician (Cardiology)  Indicate any recent Medical Services you may have received from other than [facility] providers in the past year (date may be approximate).     Assessment:   This is a routine wellness examination for [place].  Hearing/Vision screen No results found.  Dietary issues and exercise activities discussed:     Goals Addressed   None    Depression Screen PHQ 2/9 Scores 12/10/2020 09/13/2020 08/11/2019 07/15/2019 07/13/2018 03/11/2018 07/21/2016  PHQ - 2 Score 0 1 0 0 0 0 0  PHQ- 9 Score 2 7 - 3 1 - -    Fall Risk Fall Risk  12/10/2020 09/13/2020 08/11/2019 07/20/2019 07/15/2019  Falls in the past year? 1 0 0 1 0  Comment - - - Emmi Telephone Survey: data to providers prior to load -  Number falls in past yr: 0 - - 1 -  Comment - - - Emmi Telephone Survey Actual Response = 1 -  Injury with Fall? 1 - - 1 -  Follow up Falls evaluation completed - Education provided;Falls prevention discussed - Falls evaluation completed    FALL RISK PREVENTION PERTAINING TO THE HOME:  Any stairs in or around the home? {YES/NO:21197} If so, are there any without handrails? {YES/NO:21197} Home free of loose throw rugs in walkways, pet beds, electrical cords, etc? {YES/NO:21197} Adequate lighting  in your home to reduce risk of falls? {YES/NO:21197}  ASSISTIVE DEVICES UTILIZED TO PREVENT FALLS:  Life alert? {YES/NO:21197} Use of a cane, walker or w/c? {YES/NO:21197} Grab bars in the bathroom? {YES/NO:21197} Shower chair or bench in shower? {YES/NO:21197} Elevated toilet seat or a handicapped toilet? {YES/NO:21197}  TIMED UP AND GO:  Was the test performed? {YES/NO:21197}.  Length of time to ambulate 10 feet: *** sec.   {Appearance of PYPP:5093267}  Cognitive Function:        Immunizations Immunization History  Administered Date(s) Administered   Fluad Quad(high Dose 65+) 08/06/2020   Influenza Whole 07/02/2009, 06/24/2010   Influenza, High Dose Seasonal PF 07/21/2016, 07/13/2018, 07/03/2019   Influenza,inj,Quad PF,6+ Mos 06/07/2014, 07/09/2017   Moderna SARS-COV2 Booster Vaccination 12/10/2020   Moderna Sars-Covid-2 Vaccination 10/15/2019, 11/12/2019, 08/30/2020   Pneumococcal Conjugate-13 02/14/2015   Pneumococcal Polysaccharide-23 08/25/2001, 07/09/2017   Td 05/18/2007   Tdap 03/11/2018   Zoster Recombinat (Shingrix) 05/16/2020   Zoster, Live 12/03/2012    TDAP status: Up to date  Flu Vaccine status: Due, Education has been provided regarding the importance of this vaccine. Advised may receive this vaccine at local pharmacy or Health Dept. Aware to provide a copy of the vaccination record if obtained from local pharmacy or Health Dept. Verbalized acceptance and understanding.  Pneumococcal vaccine status: Up to date  Covid-19 vaccine status: Completed vaccines  Qualifies for Shingles Vaccine? No   Zostavax completed Yes   Shingrix Completed?: Yes  Screening Tests Health Maintenance  Topic Date Due   Zoster Vaccines- Shingrix (2 of 2) 07/11/2020   COVID-19 Vaccine (5 - Booster for Moderna series) 04/11/2021   INFLUENZA VACCINE  03/25/2021   PAP SMEAR-Modifier  05/22/2021   MAMMOGRAM  07/02/2021   PNA vac Low Risk Adult (2 of 2 - PPSV23) 07/09/2022    COLONOSCOPY (Pts 45-31yrs Insurance coverage will need to be confirmed)  09/07/2026   TETANUS/TDAP  03/11/2028   DEXA SCAN  Completed   Hepatitis C Screening  Completed   HPV VACCINES  Aged Out    Health Maintenance  Health Maintenance Due  Topic Date Due   Zoster Vaccines- Shingrix (2 of 2) 07/11/2020   COVID-19 Vaccine (5 - Booster for Moderna series) 04/11/2021   INFLUENZA VACCINE  03/25/2021   PAP SMEAR-Modifier  05/22/2021    Colorectal cancer screening: Type of screening: Colonoscopy. Completed 09/08/2019. Repeat every 7 years  Mammogram status: Completed Bilateral 07/02/2020. Repeat every year  {Bone Density status:21018021}  Lung Cancer Screening: (Low Dose CT Chest recommended if Age 56-80 years, 30 pack-year currently smoking OR have quit w/in 15years.) does not qualify.     Additional Screening:  Hepatitis C Screening: Completed 05/22/2015   Vision Screening: Recommended annual ophthalmology exams for early detection of glaucoma and other disorders of the eye. Is the patient up to date with their annual eye exam?  {YES/NO:21197} Who is the provider or what is the name of the office in which the patient attends annual eye exams? *** If pt is not established with a provider, would they like to be referred to a provider to establish care? {YES/NO:21197}.   Dental Screening: Recommended annual dental exams for proper oral hygiene  Community Resource Referral / Chronic Care Management: CRR required this visit?  {YES/NO:21197}  CCM required this visit?  {YES/NO:21197}     Plan:     I have personally reviewed and noted the following in the patient's chart:   Medical and social history Use of alcohol, tobacco or illicit drugs  Current medications and supplements including opioid prescriptions.  Functional ability and status Nutritional status Physical activity Advanced directives List of other physicians Hospitalizations, surgeries, and ER visits in previous  12 months Vitals Screenings to include cognitive, depression, and falls Referrals and appointments  In addition, I have reviewed and discussed with patient certain preventive protocols, quality metrics, and best practice recommendations. A written personalized care plan for preventive services as well as general preventive health recommendations were provided to patient.   Due to this being a telephonic visit, the after visit summary with patients personalized plan was offered to patient via mail or my-chart. ***Patient declined at this time./ Patient would like to access on my-chart/ per request, patient was mailed a copy of AVS./ Patient preferred to pick up at office at next visit.   Marta Antu, LPN   [id number]  Nurse Health Advisor  Nurse Notes: ***

## 2021-04-17 NOTE — Telephone Encounter (Signed)
Attempted to reach patient x 3 for her 1:00 phone visit for her Medicare Wellness exam. Left message for patient to call back to reschedule.

## 2021-04-23 ENCOUNTER — Other Ambulatory Visit (HOSPITAL_BASED_OUTPATIENT_CLINIC_OR_DEPARTMENT_OTHER): Payer: Self-pay

## 2021-05-14 DIAGNOSIS — B349 Viral infection, unspecified: Secondary | ICD-10-CM | POA: Diagnosis not present

## 2021-05-14 DIAGNOSIS — U071 COVID-19: Secondary | ICD-10-CM | POA: Diagnosis not present

## 2021-05-15 ENCOUNTER — Telehealth: Payer: Self-pay

## 2021-05-15 NOTE — Telephone Encounter (Signed)
Spoke w/ Pt's sister, [name] Hydrologist of attorney)- she informed that Pt went to urgent care yesterday and was prescribed Paxlovid- Pt has not taken any of it- informed that Pt does have 4 covid vaccines- chances of getting super sick is low BUT can be even lower if she would take the Paxlovid. [name] worried about potential medication interactions w/ Paxlovid and other medications that Pt is supposed to be taking. [name] is unsure if Pt is even taking her medications at all or how often. [name] informed that she drove over to Pt's house because Pt was not acting like herself- when she got there she realized Pt is drunk (wine drinker). [name] informed that this is nothing new that Pt drinks frequently- Pt has lost a son and her other son is not able to help, [name] states she is all the Pt has and thinks this is depressing for Pt. [name] informed me that Pt's pulse rate was 140 currently- we were able to ask Pt if she had taken her BP medications today and she stated no, asked when the last time she took them and Pt answered she wasn't sure. Her O2 is 97%, and having some cough and nasal congestion only, no fever, I recommended Coricidin, Robitussin DM or Mucinex DM and otc flonase which [name] said Pt wouldn't do because Pt did not want to put anything up her nose. I informed [name] I thought that the Pt should go back to urgent care or ED given her high pulse rate but [name] states Pt wouldn't go and wasn't sure if she should be sitting in a waiting room while intoxicated. [name] stated she would try to get Pt to take her BP meds and keep an eye on HR and go by pharmacy to get her some otc meds for symptoms. I informed [name] to keep [place] updated and that I would send a message to covering provider (PCP out of office until next week) to make sure I didn't miss anything. [name] was very thankful for call.   I will also send this message to PCP to make him aware of noncompliance w/ meds and ETOH use.

## 2021-05-15 NOTE — Telephone Encounter (Signed)
Stay hydrated. If pulse doesn't come down, consider seeing care. Would consider having her f/u w pcp when he returns as he has intimate knowledge of her health. That being said, if pt or her sister feels she wants her seen sooner, please sched w available doc. Ty.

## 2021-05-15 NOTE — Telephone Encounter (Signed)
Pt's Sister, [name], called and stated she needs help with pt.  Pt tested positive yesterday for Covid and went to an UC, was prescribed Paxlovid.  She stated pt is not acting like herself today.  Pt will not take the Paxlovid.  She is unsure even if the pt told the UC all the medications she is currently taking and if it is safe for the pt to take Paxlovid to begin with.  She said pt told her her pulse ox was 98 today, but has not taken her temperature.  She is looking for advice on how to help the pt.  Please advise.  [name] would like a call back to her at [phone].

## 2021-05-15 NOTE — Telephone Encounter (Signed)
Spoke w/ [name]- informed of Dr. Serita Sheller recommendations. [name] will call back to set Pt up a f/u w/ PCP- she plans to come with her to that visit.

## 2021-05-17 NOTE — Telephone Encounter (Signed)
Thank you Vilma Prader and Dr Nani Ravens

## 2021-07-09 ENCOUNTER — Telehealth: Payer: Self-pay | Admitting: Internal Medicine

## 2021-07-09 DIAGNOSIS — E785 Hyperlipidemia, unspecified: Secondary | ICD-10-CM

## 2021-07-09 MED ORDER — ROSUVASTATIN CALCIUM 40 MG PO TABS: 40.0000 mg | ORAL_TABLET | Freq: Every day | ORAL | 1 refills | Status: DC

## 2021-07-09 NOTE — Telephone Encounter (Signed)
Spoke with patient and notified her that she should continue both medications as prescribed as no changes were made at last visit. Refilled crestor to [facility]. Confirmed with patient that she has an active Set designer

## 2021-07-09 NOTE — Telephone Encounter (Signed)
Patient is called wanting to know if she should be taking both rosuvastatin (CRESTOR) 40 MG tablet and repatha.

## 2021-07-23 DIAGNOSIS — Z6827 Body mass index (BMI) 27.0-27.9, adult: Secondary | ICD-10-CM | POA: Diagnosis not present

## 2021-07-23 DIAGNOSIS — Z124 Encounter for screening for malignant neoplasm of cervix: Secondary | ICD-10-CM | POA: Diagnosis not present

## 2021-07-23 DIAGNOSIS — Z1231 Encounter for screening mammogram for malignant neoplasm of breast: Secondary | ICD-10-CM | POA: Diagnosis not present

## 2021-07-23 DIAGNOSIS — M816 Localized osteoporosis [Lequesne]: Secondary | ICD-10-CM | POA: Diagnosis not present

## 2021-07-23 DIAGNOSIS — N958 Other specified menopausal and perimenopausal disorders: Secondary | ICD-10-CM | POA: Diagnosis not present

## 2021-07-23 LAB — HM DEXA SCAN

## 2021-07-23 LAB — HM PAP SMEAR: HM Pap smear: NEGATIVE

## 2021-07-23 LAB — HM MAMMOGRAPHY

## 2021-07-25 ENCOUNTER — Telehealth: Payer: Self-pay | Admitting: Internal Medicine

## 2021-07-25 NOTE — Telephone Encounter (Signed)
Approved until 08/24/21

## 2021-07-25 NOTE — Telephone Encounter (Signed)
PA for repatha sureclick submitted via CMM (Key: [id number])

## 2021-08-07 ENCOUNTER — Other Ambulatory Visit: Payer: Self-pay | Admitting: Internal Medicine

## 2021-08-07 MED ORDER — CITALOPRAM HYDROBROMIDE 20 MG PO TABS: 20.0000 mg | ORAL_TABLET | Freq: Every day | ORAL | 0 refills | Status: DC

## 2021-08-08 ENCOUNTER — Telehealth: Payer: Self-pay | Admitting: Internal Medicine

## 2021-08-08 NOTE — Telephone Encounter (Signed)
Medication refill:   citalopram (CELEXA) 20 MG tablet [[id number]]  [id number] [name] [name], [address] [facility] [address]  [place], [place] [address]  Phone:  [phone]  Fax:  [phone]

## 2021-08-08 NOTE — Telephone Encounter (Signed)
A 30 day supply was sent 08/07/21- she needs a visit for further refills please.

## 2021-08-14 ENCOUNTER — Encounter: Payer: Self-pay | Admitting: Internal Medicine

## 2021-08-17 ENCOUNTER — Other Ambulatory Visit: Payer: Self-pay | Admitting: Physical Medicine and Rehabilitation

## 2021-08-17 ENCOUNTER — Other Ambulatory Visit: Payer: Self-pay | Admitting: Internal Medicine

## 2021-08-20 ENCOUNTER — Other Ambulatory Visit: Payer: Self-pay | Admitting: Internal Medicine

## 2021-08-21 ENCOUNTER — Other Ambulatory Visit: Payer: Self-pay | Admitting: Physical Medicine and Rehabilitation

## 2021-08-23 ENCOUNTER — Telehealth: Payer: Self-pay | Admitting: Internal Medicine

## 2021-08-23 NOTE — Telephone Encounter (Signed)
Spoke w/ Pt- informed she should be on metoprolol and lisinopril. Pt verbalized understanding.

## 2021-08-23 NOTE — Telephone Encounter (Signed)
pt would like to know if she should take both meds metoprolol tartrate and lisinopril. please advise.

## 2021-08-25 ENCOUNTER — Encounter (HOSPITAL_COMMUNITY): Payer: Self-pay | Admitting: Emergency Medicine

## 2021-08-25 ENCOUNTER — Emergency Department (HOSPITAL_COMMUNITY): Payer: Medicare Other

## 2021-08-25 ENCOUNTER — Inpatient Hospital Stay (HOSPITAL_COMMUNITY)
Admission: EM | Admit: 2021-08-25 | Discharge: 2021-08-30 | DRG: 102 | Disposition: A | Discharge status: Rehabilitation Facility | Payer: Medicare Other | Attending: Internal Medicine | Admitting: Internal Medicine

## 2021-08-25 ENCOUNTER — Inpatient Hospital Stay (HOSPITAL_COMMUNITY): Payer: Medicare Other

## 2021-08-25 ENCOUNTER — Other Ambulatory Visit: Payer: Self-pay

## 2021-08-25 DIAGNOSIS — Z20822 Contact with and (suspected) exposure to covid-19: Secondary | ICD-10-CM | POA: Diagnosis present

## 2021-08-25 DIAGNOSIS — Z9221 Personal history of antineoplastic chemotherapy: Secondary | ICD-10-CM

## 2021-08-25 DIAGNOSIS — Z79899 Other long term (current) drug therapy: Secondary | ICD-10-CM

## 2021-08-25 DIAGNOSIS — I6523 Occlusion and stenosis of bilateral carotid arteries: Secondary | ICD-10-CM | POA: Diagnosis not present

## 2021-08-25 DIAGNOSIS — R1310 Dysphagia, unspecified: Secondary | ICD-10-CM | POA: Diagnosis present

## 2021-08-25 DIAGNOSIS — J449 Chronic obstructive pulmonary disease, unspecified: Secondary | ICD-10-CM | POA: Diagnosis present

## 2021-08-25 DIAGNOSIS — G4489 Other headache syndrome: Secondary | ICD-10-CM | POA: Diagnosis not present

## 2021-08-25 DIAGNOSIS — Z880 Allergy status to penicillin: Secondary | ICD-10-CM

## 2021-08-25 DIAGNOSIS — Z87891 Personal history of nicotine dependence: Secondary | ICD-10-CM

## 2021-08-25 DIAGNOSIS — Z7982 Long term (current) use of aspirin: Secondary | ICD-10-CM

## 2021-08-25 DIAGNOSIS — F1010 Alcohol abuse, uncomplicated: Secondary | ICD-10-CM | POA: Diagnosis present

## 2021-08-25 DIAGNOSIS — Z8249 Family history of ischemic heart disease and other diseases of the circulatory system: Secondary | ICD-10-CM | POA: Diagnosis not present

## 2021-08-25 DIAGNOSIS — Z043 Encounter for examination and observation following other accident: Secondary | ICD-10-CM | POA: Diagnosis not present

## 2021-08-25 DIAGNOSIS — Z8052 Family history of malignant neoplasm of bladder: Secondary | ICD-10-CM | POA: Diagnosis not present

## 2021-08-25 DIAGNOSIS — W19XXXA Unspecified fall, initial encounter: Secondary | ICD-10-CM | POA: Diagnosis not present

## 2021-08-25 DIAGNOSIS — E538 Deficiency of other specified B group vitamins: Secondary | ICD-10-CM | POA: Diagnosis present

## 2021-08-25 DIAGNOSIS — Z902 Acquired absence of lung [part of]: Secondary | ICD-10-CM

## 2021-08-25 DIAGNOSIS — M19042 Primary osteoarthritis, left hand: Secondary | ICD-10-CM | POA: Diagnosis present

## 2021-08-25 DIAGNOSIS — Z86010 Personal history of colonic polyps: Secondary | ICD-10-CM

## 2021-08-25 DIAGNOSIS — G43809 Other migraine, not intractable, without status migrainosus: Secondary | ICD-10-CM

## 2021-08-25 DIAGNOSIS — G9341 Metabolic encephalopathy: Secondary | ICD-10-CM | POA: Diagnosis present

## 2021-08-25 DIAGNOSIS — T447X6A Underdosing of beta-adrenoreceptor antagonists, initial encounter: Secondary | ICD-10-CM | POA: Diagnosis present

## 2021-08-25 DIAGNOSIS — R059 Cough, unspecified: Secondary | ICD-10-CM | POA: Diagnosis present

## 2021-08-25 DIAGNOSIS — Z823 Family history of stroke: Secondary | ICD-10-CM

## 2021-08-25 DIAGNOSIS — Z9049 Acquired absence of other specified parts of digestive tract: Secondary | ICD-10-CM

## 2021-08-25 DIAGNOSIS — R479 Unspecified speech disturbances: Secondary | ICD-10-CM | POA: Diagnosis present

## 2021-08-25 DIAGNOSIS — I10 Essential (primary) hypertension: Secondary | ICD-10-CM | POA: Diagnosis not present

## 2021-08-25 DIAGNOSIS — R000 Tachycardia, unspecified: Secondary | ICD-10-CM | POA: Diagnosis not present

## 2021-08-25 DIAGNOSIS — Z8371 Family history of colonic polyps: Secondary | ICD-10-CM | POA: Diagnosis not present

## 2021-08-25 DIAGNOSIS — G43109 Migraine with aura, not intractable, without status migrainosus: Secondary | ICD-10-CM | POA: Diagnosis present

## 2021-08-25 DIAGNOSIS — F10239 Alcohol dependence with withdrawal, unspecified: Secondary | ICD-10-CM | POA: Diagnosis not present

## 2021-08-25 DIAGNOSIS — F32A Depression, unspecified: Secondary | ICD-10-CM | POA: Diagnosis present

## 2021-08-25 DIAGNOSIS — S060XAA Concussion with loss of consciousness status unknown, initial encounter: Secondary | ICD-10-CM

## 2021-08-25 DIAGNOSIS — Z923 Personal history of irradiation: Secondary | ICD-10-CM | POA: Diagnosis not present

## 2021-08-25 DIAGNOSIS — R29818 Other symptoms and signs involving the nervous system: Secondary | ICD-10-CM

## 2021-08-25 DIAGNOSIS — R4182 Altered mental status, unspecified: Secondary | ICD-10-CM

## 2021-08-25 DIAGNOSIS — G44309 Post-traumatic headache, unspecified, not intractable: Secondary | ICD-10-CM | POA: Diagnosis present

## 2021-08-25 DIAGNOSIS — H5340 Unspecified visual field defects: Secondary | ICD-10-CM | POA: Diagnosis not present

## 2021-08-25 DIAGNOSIS — G4700 Insomnia, unspecified: Secondary | ICD-10-CM | POA: Diagnosis present

## 2021-08-25 DIAGNOSIS — M545 Low back pain, unspecified: Secondary | ICD-10-CM | POA: Diagnosis present

## 2021-08-25 DIAGNOSIS — G8194 Hemiplegia, unspecified affecting left nondominant side: Secondary | ICD-10-CM | POA: Diagnosis present

## 2021-08-25 DIAGNOSIS — F0781 Postconcussional syndrome: Secondary | ICD-10-CM | POA: Diagnosis present

## 2021-08-25 DIAGNOSIS — H53462 Homonymous bilateral field defects, left side: Secondary | ICD-10-CM | POA: Diagnosis present

## 2021-08-25 DIAGNOSIS — R531 Weakness: Secondary | ICD-10-CM | POA: Diagnosis not present

## 2021-08-25 DIAGNOSIS — Z85118 Personal history of other malignant neoplasm of bronchus and lung: Secondary | ICD-10-CM

## 2021-08-25 DIAGNOSIS — H539 Unspecified visual disturbance: Secondary | ICD-10-CM | POA: Diagnosis not present

## 2021-08-25 DIAGNOSIS — R410 Disorientation, unspecified: Secondary | ICD-10-CM

## 2021-08-25 DIAGNOSIS — S060XAS Concussion with loss of consciousness status unknown, sequela: Secondary | ICD-10-CM

## 2021-08-25 DIAGNOSIS — D62 Acute posthemorrhagic anemia: Secondary | ICD-10-CM | POA: Diagnosis present

## 2021-08-25 DIAGNOSIS — M19041 Primary osteoarthritis, right hand: Secondary | ICD-10-CM | POA: Diagnosis present

## 2021-08-25 DIAGNOSIS — R414 Neurologic neglect syndrome: Secondary | ICD-10-CM | POA: Diagnosis present

## 2021-08-25 DIAGNOSIS — Z832 Family history of diseases of the blood and blood-forming organs and certain disorders involving the immune mechanism: Secondary | ICD-10-CM

## 2021-08-25 DIAGNOSIS — Z91138 Patient's unintentional underdosing of medication regimen for other reason: Secondary | ICD-10-CM

## 2021-08-25 DIAGNOSIS — S0990XA Unspecified injury of head, initial encounter: Secondary | ICD-10-CM | POA: Diagnosis not present

## 2021-08-25 DIAGNOSIS — E559 Vitamin D deficiency, unspecified: Secondary | ICD-10-CM | POA: Diagnosis present

## 2021-08-25 DIAGNOSIS — G319 Degenerative disease of nervous system, unspecified: Secondary | ICD-10-CM | POA: Diagnosis not present

## 2021-08-25 DIAGNOSIS — T464X6A Underdosing of angiotensin-converting-enzyme inhibitors, initial encounter: Secondary | ICD-10-CM | POA: Diagnosis present

## 2021-08-25 DIAGNOSIS — R269 Unspecified abnormalities of gait and mobility: Secondary | ICD-10-CM | POA: Diagnosis present

## 2021-08-25 DIAGNOSIS — Z885 Allergy status to narcotic agent status: Secondary | ICD-10-CM

## 2021-08-25 DIAGNOSIS — Z800 Family history of malignant neoplasm of digestive organs: Secondary | ICD-10-CM

## 2021-08-25 DIAGNOSIS — F419 Anxiety disorder, unspecified: Secondary | ICD-10-CM | POA: Diagnosis present

## 2021-08-25 DIAGNOSIS — W19XXXS Unspecified fall, sequela: Secondary | ICD-10-CM | POA: Diagnosis present

## 2021-08-25 DIAGNOSIS — I671 Cerebral aneurysm, nonruptured: Secondary | ICD-10-CM | POA: Diagnosis not present

## 2021-08-25 DIAGNOSIS — G8929 Other chronic pain: Secondary | ICD-10-CM | POA: Diagnosis present

## 2021-08-25 DIAGNOSIS — G9340 Encephalopathy, unspecified: Secondary | ICD-10-CM | POA: Diagnosis not present

## 2021-08-25 DIAGNOSIS — E7800 Pure hypercholesterolemia, unspecified: Secondary | ICD-10-CM | POA: Diagnosis present

## 2021-08-25 DIAGNOSIS — H53431 Sector or arcuate defects, right eye: Secondary | ICD-10-CM | POA: Diagnosis not present

## 2021-08-25 DIAGNOSIS — Z9181 History of falling: Secondary | ICD-10-CM

## 2021-08-25 DIAGNOSIS — Z85820 Personal history of malignant melanoma of skin: Secondary | ICD-10-CM

## 2021-08-25 HISTORY — DX: Concussion with loss of consciousness status unknown, initial encounter: S06.0XAA

## 2021-08-25 LAB — COMPREHENSIVE METABOLIC PANEL
ALT: 32 U/L (ref 0–44)
AST: 37 U/L (ref 15–41)
Albumin: 4.2 g/dL (ref 3.5–5.0)
Alkaline Phosphatase: 64 U/L (ref 38–126)
Anion gap: 13 (ref 5–15)
BUN: 11 mg/dL (ref 8–23)
CO2: 23 mmol/L (ref 22–32)
Calcium: 9.4 mg/dL (ref 8.9–10.3)
Chloride: 99 mmol/L (ref 98–111)
Creatinine, Ser: 1.09 mg/dL — ABNORMAL HIGH (ref 0.44–1.00)
GFR, Estimated: 55 mL/min — ABNORMAL LOW (ref >60)
Glucose, Bld: 134 mg/dL — ABNORMAL HIGH (ref 70–99)
Potassium: 3.8 mmol/L (ref 3.5–5.1)
Sodium: 135 mmol/L (ref 135–145)
Total Bilirubin: 1 mg/dL (ref 0.3–1.2)

## 2021-08-25 LAB — I-STAT CHEM 8, ED
BUN: 12 mg/dL (ref 8–23)
Calcium, Ion: 1.09 mmol/L — ABNORMAL LOW (ref 1.15–1.40)
Chloride: 101 mmol/L (ref 98–111)
Creatinine, Ser: 0.9 mg/dL (ref 0.44–1.00)
Glucose, Bld: 128 mg/dL — ABNORMAL HIGH (ref 70–99)
HCT: 47 % — ABNORMAL HIGH (ref 36.0–46.0)
Hemoglobin: 16 g/dL — ABNORMAL HIGH (ref 12.0–15.0)
Potassium: 3.8 mmol/L (ref 3.5–5.1)
Sodium: 134 mmol/L — ABNORMAL LOW (ref 135–145)
TCO2: 25 mmol/L (ref 22–32)

## 2021-08-25 LAB — DIFFERENTIAL
Abs Immature Granulocytes: 0.03 10*3/uL (ref 0.00–0.07)
Basophils Absolute: 0 10*3/uL (ref 0.0–0.1)
Basophils Relative: 0 %
Eosinophils Absolute: 0 10*3/uL (ref 0.0–0.5)
Eosinophils Relative: 0 %
Immature Granulocytes: 0 %
Lymphocytes Relative: 6 %
Lymphs Abs: 0.7 10*3/uL (ref 0.7–4.0)
Monocytes Absolute: 0.8 10*3/uL (ref 0.1–1.0)
Monocytes Relative: 7 %
Neutro Abs: 9.6 10*3/uL — ABNORMAL HIGH (ref 1.7–7.7)
Neutrophils Relative %: 87 %

## 2021-08-25 LAB — PROTIME-INR
INR: 0.9 (ref 0.8–1.2)
Prothrombin Time: 11.9 seconds (ref 11.4–15.2)

## 2021-08-25 LAB — CBC
HCT: 44.1 % (ref 36.0–46.0)
Hemoglobin: 15.1 g/dL — ABNORMAL HIGH (ref 12.0–15.0)
MCH: 33 pg (ref 26.0–34.0)
MCHC: 34.2 g/dL (ref 30.0–36.0)
MCV: 96.5 fL (ref 80.0–100.0)
Platelets: 234 10*3/uL (ref 150–400)
RBC: 4.57 MIL/uL (ref 3.87–5.11)
RDW: 11.7 % (ref 11.5–15.5)
WBC: 11.1 10*3/uL — ABNORMAL HIGH (ref 4.0–10.5)
nRBC: 0 % (ref 0.0–0.2)

## 2021-08-25 LAB — VITAMIN B12: Vitamin B-12: 398 pg/mL (ref 180–914)

## 2021-08-25 LAB — HIV ANTIBODY (ROUTINE TESTING W REFLEX): HIV Screen 4th Generation wRfx: NONREACTIVE

## 2021-08-25 LAB — ETHANOL: Alcohol, Ethyl (B): <10 mg/dL (ref <10)

## 2021-08-25 LAB — CBG MONITORING, ED: Glucose-Capillary: 141 mg/dL — ABNORMAL HIGH (ref 70–99)

## 2021-08-25 LAB — RESP PANEL BY RT-PCR (FLU A&B, COVID) ARPGX2
Influenza A by PCR: NEGATIVE
Influenza B by PCR: NEGATIVE
SARS Coronavirus 2 by RT PCR: NEGATIVE

## 2021-08-25 LAB — TSH: TSH: 4.801 u[IU]/mL — ABNORMAL HIGH (ref 0.350–4.500)

## 2021-08-25 LAB — APTT: aPTT: 26 seconds (ref 24–36)

## 2021-08-25 LAB — AMMONIA: Ammonia: <10 umol/L (ref 9–35)

## 2021-08-25 MED ORDER — ACETAMINOPHEN 325 MG PO TABS: 650.0000 mg | ORAL_TABLET | ORAL | Status: DC | PRN

## 2021-08-25 MED ORDER — LABETALOL HCL 5 MG/ML IV SOLN: 10.0000 mg | Freq: Four times a day (QID) | INTRAVENOUS | Status: DC | PRN

## 2021-08-25 MED ORDER — METOPROLOL TARTRATE 5 MG/5ML IV SOLN: 5.0000 mg | INTRAVENOUS | Status: DC | PRN

## 2021-08-25 MED ORDER — DIPHENHYDRAMINE HCL 50 MG/ML IJ SOLN: 12.5000 mg | Freq: Once | INTRAMUSCULAR | Status: DC

## 2021-08-25 MED ORDER — THIAMINE HCL 100 MG PO TABS: 100.0000 mg | ORAL_TABLET | Freq: Every day | ORAL | Status: DC

## 2021-08-25 MED ORDER — THIAMINE HCL 100 MG/ML IJ SOLN
100.0000 mg | Freq: Every day | INTRAMUSCULAR | Status: DC
  Administered 2021-08-25: 100 mg via INTRAVENOUS
  Filled 2021-08-25: qty 2

## 2021-08-25 MED ORDER — RALOXIFENE HCL 60 MG PO TABS: 60.0000 mg | ORAL_TABLET | Freq: Every day | ORAL | Status: DC

## 2021-08-25 MED ORDER — ASPIRIN EC 81 MG PO TBEC
81.0000 mg | DELAYED_RELEASE_TABLET | Freq: Every day | ORAL | Status: DC
  Administered 2021-08-26 – 2021-08-30 (×5): 81 mg via ORAL
  Filled 2021-08-25 (×5): qty 1

## 2021-08-25 MED ORDER — SODIUM CHLORIDE 0.9 % IV SOLN
100.0000 mL/h | INTRAVENOUS | Status: DC
  Administered 2021-08-25 – 2021-08-28 (×2): 100 mL/h via INTRAVENOUS

## 2021-08-25 MED ORDER — KETOROLAC TROMETHAMINE 30 MG/ML IJ SOLN
15.0000 mg | Freq: Once | INTRAMUSCULAR | Status: AC
  Administered 2021-08-25: 15 mg via INTRAVENOUS
  Filled 2021-08-25: qty 1

## 2021-08-25 MED ORDER — ROSUVASTATIN CALCIUM 20 MG PO TABS: 40.0000 mg | ORAL_TABLET | Freq: Every day | ORAL | Status: DC

## 2021-08-25 MED ORDER — METOPROLOL TARTRATE 25 MG PO TABS: 25.0000 mg | ORAL_TABLET | Freq: Two times a day (BID) | ORAL | Status: DC

## 2021-08-25 MED ORDER — LORAZEPAM 1 MG PO TABS: 1.0000 mg | ORAL_TABLET | ORAL | Status: AC | PRN

## 2021-08-25 MED ORDER — THIAMINE HCL 100 MG/ML IJ SOLN
100.0000 mg | Freq: Once | INTRAMUSCULAR | Status: AC
  Administered 2021-08-25: 100 mg via INTRAVENOUS
  Filled 2021-08-25: qty 2

## 2021-08-25 MED ORDER — ADULT MULTIVITAMIN W/MINERALS CH
1.0000 | ORAL_TABLET | Freq: Every day | ORAL | Status: DC
  Administered 2021-08-26 – 2021-08-30 (×4): 1 via ORAL
  Filled 2021-08-25 (×4): qty 1

## 2021-08-25 MED ORDER — ACETAMINOPHEN 325 MG PO TABS: 650.0000 mg | ORAL_TABLET | Freq: Four times a day (QID) | ORAL | Status: DC | PRN

## 2021-08-25 MED ORDER — FOLIC ACID 1 MG PO TABS
1.0000 mg | ORAL_TABLET | Freq: Every day | ORAL | Status: DC
  Administered 2021-08-26 – 2021-08-30 (×5): 1 mg via ORAL
  Filled 2021-08-25 (×5): qty 1

## 2021-08-25 MED ORDER — METOCLOPRAMIDE HCL 5 MG/ML IJ SOLN
10.0000 mg | Freq: Once | INTRAMUSCULAR | Status: AC
  Administered 2021-08-25: 10 mg via INTRAVENOUS
  Filled 2021-08-25: qty 2

## 2021-08-25 MED ORDER — LORAZEPAM 2 MG/ML IJ SOLN
1.0000 mg | INTRAMUSCULAR | Status: AC | PRN
  Administered 2021-08-26: 2 mg via INTRAVENOUS
  Filled 2021-08-25 (×2): qty 1

## 2021-08-25 MED ORDER — ACETAMINOPHEN 650 MG RE SUPP: 650.0000 mg | RECTAL | Status: DC | PRN

## 2021-08-25 MED ORDER — SENNOSIDES-DOCUSATE SODIUM 8.6-50 MG PO TABS: 1.0000 | ORAL_TABLET | Freq: Every evening | ORAL | Status: DC | PRN

## 2021-08-25 MED ORDER — VITAMIN D3 50 MCG (2000 UT) PO CAPS: 4000.0000 [IU] | ORAL_CAPSULE | Freq: Every day | ORAL | Status: DC

## 2021-08-25 MED ORDER — SODIUM CHLORIDE 0.9 % IV SOLN: INTRAVENOUS | Status: DC

## 2021-08-25 MED ORDER — ENOXAPARIN SODIUM 40 MG/0.4ML IJ SOSY
40.0000 mg | PREFILLED_SYRINGE | INTRAMUSCULAR | Status: DC
  Administered 2021-08-25 – 2021-08-29 (×5): 40 mg via SUBCUTANEOUS
  Filled 2021-08-25 (×5): qty 0.4

## 2021-08-25 MED ORDER — VITAMIN B-12 1000 MCG PO TABS
2000.0000 ug | ORAL_TABLET | Freq: Every day | ORAL | Status: DC
  Administered 2021-08-26 – 2021-08-30 (×5): 2000 ug via ORAL
  Filled 2021-08-25 (×6): qty 2

## 2021-08-25 MED ORDER — PROCHLORPERAZINE EDISYLATE 10 MG/2ML IJ SOLN
10.0000 mg | Freq: Once | INTRAMUSCULAR | Status: AC
  Administered 2021-08-25: 10 mg via INTRAVENOUS
  Filled 2021-08-25: qty 2

## 2021-08-25 MED ORDER — SODIUM CHLORIDE 0.9 % IV BOLUS
500.0000 mL | Freq: Once | INTRAVENOUS | Status: AC
  Administered 2021-08-25: 500 mL via INTRAVENOUS

## 2021-08-25 MED ORDER — STROKE: EARLY STAGES OF RECOVERY BOOK
Freq: Once | Status: DC
  Filled 2021-08-25: qty 1

## 2021-08-25 MED ORDER — ACETAMINOPHEN 160 MG/5ML PO SOLN
650.0000 mg | ORAL | Status: DC | PRN
  Administered 2021-08-26: 650 mg
  Filled 2021-08-25: qty 20.3

## 2021-08-25 MED ORDER — CITALOPRAM HYDROBROMIDE 10 MG PO TABS
20.0000 mg | ORAL_TABLET | Freq: Every day | ORAL | Status: DC
  Administered 2021-08-26 – 2021-08-30 (×5): 20 mg via ORAL
  Filled 2021-08-25 (×5): qty 2

## 2021-08-25 MED ORDER — CYANOCOBALAMIN 2500 MCG PO CHEW: 2.0000 | CHEWABLE_TABLET | Freq: Every day | ORAL | Status: DC

## 2021-08-25 MED ORDER — VITAMIN D 25 MCG (1000 UNIT) PO TABS
4000.0000 [IU] | ORAL_TABLET | Freq: Every day | ORAL | Status: DC
  Administered 2021-08-26 – 2021-08-30 (×5): 4000 [IU] via ORAL
  Filled 2021-08-25 (×5): qty 4

## 2021-08-25 NOTE — Progress Notes (Signed)
[facility] night cross cover note:  I was notified by RN that this patient, who is here for stroke evaluation, has failed her nursing bedside swallow screen.  Consequently, I have held the patient's evening metoprolol and rosuvastatin, and have placed order for formal ST consult for the morning.    Babs Bertin, DO Hospitalist

## 2021-08-25 NOTE — Progress Notes (Signed)
Pt brought down to MRI for exam via pt transport. Pt safety screening verbally via sister prior to exam. Pt prepared for exam and began scanning. Pt moving head continuously. Unable to obtain any diagnostic imaging in pt's current condition (pt unable to remain still). Attempted to call RN to possibly provide meds but no answer on the floor. Several attempts made to verbally redirect pt in hopes to improve imaging to no avail. Pt sent back to room via pt transport.

## 2021-08-25 NOTE — ED Notes (Signed)
Patient transported to MRI, and then will be transported to 3W, room 6. IP RN aware.

## 2021-08-25 NOTE — ED Triage Notes (Signed)
Pt here via [facility] from home for stroke-like symptoms. Per pt's sister, she went to visit pt last night at 5pm and she was acting "weird", stuttering her words, repeating words. Pt's last known normal was sometime on the 30th. Per EMS pt is intermittently following commands, has a R gaze, no unilateral weakness. Pt also hasn't been taking her BP medication in approx 2 weeks. 185/112, 110HR, 130 CBG.

## 2021-08-25 NOTE — H&P (Signed)
History and Physical    [name] [name]:[id number] DOB: [birth year] DOA: 08/25/2021  PCP: Colon Branch, MD (Confirm with patient/family/NH records and if not entered, this has to be entered at [facility] point of entry) Patient coming from: Home  I have personally briefly reviewed patient's old medical records in [place]  Chief Complaint: Leave me alone  HPI: [name] [name] is a 71 y.o. female with medical history significant of ocular migraines, HTN noncompliant with BP meds, HLD, non-small cell lung cancer 2013 status post lobectomy, chemotherapy and prophylactic cranial irradiation same year, and has been on observation with oncology, anxiety/depression, alcohol abuse, presented with after mentations.  Patient is confused, most history provided by patient's sister at bedside.  Patient lives by herself with sister stops by regularly.  Sister reported patient started to have gait problems since yesterday, and sister thinks patient has a newly developed left-sided weakness, with limping toward the left side.  And also right-sided neglect.  This morning, sister visited patient again and found patient very confused, and complaining about " having migraines".  Recently, patient has complained a lot about unable to swallow pills and stopped taking her blood pressure medications.  And sister also noticed patient has had developed coughing and some the choking episode after eating solid food but not liquid.  Denies any weight loss, no fever chills or cough.  ED Course: Patient remained confused, vital signs blood pressure significantly elevated.  CT head negative for acute findings.  Blood work showed WBC 11.1  Review of Systems: Unable to perform, patient confused.  Past Medical History:  Diagnosis Date   Anemia    duering cancer treatment    Anxiety    Arthritis    OA hands    Bilateral lower extremity pain    Blood transfusion without reported diagnosis    during chemo  for lung cancer   Cataract    forming  but very small    Cholelithiasis    Colon polyps    adenomatous   High cholesterol    History of chemotherapy    for lung cancer- completed 2013   Hx of radiation therapy 10/07/11 to 11/20/11   right lung   Hx of radiation therapy 01/07/2012- 01/21/12   cranial irradiation   Hypertension    Lung cancer ([place]) 09/19/11   Stage III currently in remission   Malignant melanoma of skin of canthus of right eye ([place]) 02/2017   Osteoporosis 04/2014   Dexa scan by Dr. Corinna Capra, T-score -2.7, next in 2 years, with at least 13 % major fracture in 10 years   Pitting edema    Bilateral lower extremities, duplex ultrasound 04/03/2015 normal, no DVT    Past Surgical History:  Procedure Laterality Date   CHOLECYSTECTOMY  04/2011   biliary stent placement   COLONOSCOPY     INCISIONAL HERNIA REPAIR  2015   MOHS SURGERY Right 02/2017   cheek and eyelid   Josph Macho Touch     Vaginal procedure   POLYPECTOMY     TUBAL LIGATION       reports that she quit smoking about 9 years ago. Her smoking use included cigarettes. She has never used smokeless tobacco. She reports current alcohol use. She reports that she does not use drugs.  Allergies  Allergen Reactions   Mosquito (Diagnostic)    Tramadol Hcl Nausea And Vomiting   Codeine Nausea And Vomiting   Penicillins Rash    Family History  Problem  Relation Age of Onset   Bladder Cancer Father    Stroke Father    Colon polyps Father    Atrial fibrillation Mother    Heart disease Mother        CHF   Clotting disorder Mother        warfarin   Colon polyps Mother    Colon cancer Maternal Aunt 23   Drug abuse Son    Breast cancer Neg Hx    Esophageal cancer Neg Hx    Stomach cancer Neg Hx    Rectal cancer Neg Hx      Prior to Admission medications   Medication Sig Start Date End Date Taking? Authorizing Provider  acetaminophen (TYLENOL) 325 MG tablet Take 650 mg by mouth every 6 (six) hours as needed for  mild pain, moderate pain or fever.    [provider]  aspirin EC 81 MG tablet Take 81 mg daily by mouth.    [provider]  Cholecalciferol (VITAMIN D3) 2000 UNITS capsule Take 4,000 Units by mouth daily.     [provider]  citalopram (CELEXA) 20 MG tablet Take 1 tablet (20 mg total) by mouth daily. 08/07/21   Colon Branch, MD  COVID-19 mRNA vaccine, Moderna, (MODERNA COVID-19 VACCINE) 100 MCG/0.5ML injection Inject into the muscle. 12/10/20   Carlyle Basques, MD  Cyanocobalamin 2500 MCG CHEW Chew 2 tablets by mouth daily.    [provider]  lisinopril (ZESTRIL) 2.5 MG tablet Take 1 tablet (2.5 mg total) by mouth daily. 04/11/21   Hilty, Nadean Corwin, MD  metoprolol tartrate (LOPRESSOR) 25 MG tablet TAKE 1 TABLET(25 MG) BY MOUTH TWICE DAILY 08/20/21   Colon Branch, MD  ondansetron (ZOFRAN-ODT) 8 MG disintegrating tablet  09/19/20   [provider]  raloxifene (EVISTA) 60 MG tablet Take 1 tablet (60 mg total) by mouth daily. 04/03/20   Hilty, Nadean Corwin, MD  REPATHA SURECLICK 941 MG/ML SOAJ INJECT 140 MG( 1 ML) UNDER THE SKIN EVERY 14 DAYS 02/04/21   Hilty, Nadean Corwin, MD  rosuvastatin (CRESTOR) 40 MG tablet Take 1 tablet (40 mg total) by mouth at bedtime. 07/09/21   Pixie Casino, MD    Physical Exam: Vitals:   08/25/21 1715 08/25/21 1717 08/25/21 1830 08/25/21 1845  BP: (!) 136/103 (!) 153/80 137/85 (!) 147/78  Pulse: (!) 115 (!) 114 (!) 111 (!) 115  Resp: 19 19 (!) 22 19  Temp:      TempSrc:      SpO2: 98% 99% 97% 100%    Constitutional: NAD, calm, comfortable Vitals:   08/25/21 1715 08/25/21 1717 08/25/21 1830 08/25/21 1845  BP: (!) 136/103 (!) 153/80 137/85 (!) 147/78  Pulse: (!) 115 (!) 114 (!) 111 (!) 115  Resp: 19 19 (!) 22 19  Temp:      TempSrc:      SpO2: 98% 99% 97% 100%   Eyes: PERRL, lids and conjunctivae normal ENMT: Mucous membranes are dry. Posterior pharynx clear of any exudate or lesions.Normal dentition.  Neck: normal,  supple, no masses, no thyromegaly Respiratory: clear to auscultation bilaterally, no wheezing, no crackles. Normal respiratory effort. No accessory muscle use.  Cardiovascular: Regular rate and rhythm, no murmurs / rubs / gallops. No extremity edema. 2+ pedal pulses. No carotid bruits.  Abdomen: no tenderness, no masses palpated. No hepatosplenomegaly. Bowel sounds positive.  Musculoskeletal: no clubbing / cyanosis. No joint deformity upper and lower extremities. Good ROM, no contractures. Normal muscle tone.  Skin: no  rashes, lesions, ulcers. No induration Neurologic: No facial droops or tongue deviation, muscle strength left arm 4/5 compared to right arm 5/5, light touch sensation intact bilaterally.  Coordination test not done due to patient not following commands. Psychiatric: Arousable, agitated, oriented to person, confused about time and place.    Labs on Admission: I have personally reviewed following labs and imaging studies  CBC: Recent Labs  Lab 08/25/21 1610 08/25/21 1620  WBC 11.1*  --   NEUTROABS 9.6*  --   HGB 15.1* 16.0*  HCT 44.1 47.0*  MCV 96.5  --   PLT 234  --    Basic Metabolic Panel: Recent Labs  Lab 08/25/21 1610 08/25/21 1620  NA 135 134*  K 3.8 3.8  CL 99 101  CO2 23  --   GLUCOSE 134* 128*  BUN 11 12  CREATININE 1.09* 0.90  CALCIUM 9.4  --    GFR: CrCl cannot be calculated (Unknown ideal weight.). Liver Function Tests: Recent Labs  Lab 08/25/21 1610  AST 37  ALT 32  ALKPHOS 64  BILITOT 1.0  PROT RESULTS UNAVAILABLE DUE TO INTERFERING SUBSTANCE  ALBUMIN 4.2   No results for input(s): LIPASE, AMYLASE in the last 168 hours. No results for input(s): AMMONIA in the last 168 hours. Coagulation Profile: Recent Labs  Lab 08/25/21 1610  INR 0.9   Cardiac Enzymes: No results for input(s): CKTOTAL, CKMB, CKMBINDEX, TROPONINI in the last 168 hours. BNP (last 3 results) No results for input(s): PROBNP in the last 8760 hours. HbA1C: No  results for input(s): HGBA1C in the last 72 hours. CBG: Recent Labs  Lab 08/25/21 1612  GLUCAP 141*   Lipid Profile: No results for input(s): CHOL, HDL, LDLCALC, TRIG, CHOLHDL, LDLDIRECT in the last 72 hours. Thyroid Function Tests: No results for input(s): TSH, T4TOTAL, FREET4, T3FREE, THYROIDAB in the last 72 hours. Anemia Panel: No results for input(s): VITAMINB12, FOLATE, FERRITIN, TIBC, IRON, RETICCTPCT in the last 72 hours. Urine analysis:    Component Value Date/Time   COLORURINE YELLOW 03/29/2012 2305   APPEARANCEUR CLEAR 03/29/2012 2305   LABSPEC 1.012 03/29/2012 2305   PHURINE 6.0 03/29/2012 2305   GLUCOSEU NEGATIVE 03/29/2012 2305   HGBUR NEGATIVE 03/29/2012 2305   BILIRUBINUR positive +1 04/30/2016 1413   KETONESUR NEGATIVE 03/29/2012 2305   PROTEINUR positive 04/30/2016 1413   PROTEINUR NEGATIVE 03/29/2012 2305   UROBILINOGEN 2.0 04/30/2016 1413   UROBILINOGEN 0.2 03/29/2012 2305   NITRITE positive 04/30/2016 1413   NITRITE NEGATIVE 03/29/2012 2305   LEUKOCYTESUR small (1+) (A) 04/30/2016 1413    Radiological Exams on Admission: CT HEAD WO CONTRAST  Result Date: 08/25/2021 CLINICAL DATA:  Neuro deficit, acute, stroke suspected EXAM: CT HEAD WITHOUT CONTRAST TECHNIQUE: Contiguous axial images were obtained from the base of the skull through the vertex without intravenous contrast. COMPARISON:  MRI head 09/16/2011 BRAIN: BRAIN Cerebral ventricle sizes are concordant with the degree of cerebral volume loss. No evidence of large-territorial acute infarction. No parenchymal hemorrhage. No mass lesion. No extra-axial collection. No mass effect or midline shift. No hydrocephalus. Basilar cisterns are patent. Vascular: No hyperdense vessel. Skull: No acute fracture or focal lesion. Sinuses/Orbits: Paranasal sinuses and mastoid air cells are clear. The orbits are unremarkable. Other: None. IMPRESSION: No acute intracranial abnormality. Electronically Signed   By: Iven Finn M.D.   On: 08/25/2021 16:47   DG Chest Portable 1 View  Result Date: 08/25/2021 CLINICAL DATA:  Weakness.  Confusion.  History of lung carcinoma. EXAM: PORTABLE  CHEST 1 VIEW COMPARISON:  03/29/2012.  Chest CT, 01/29/2021. FINDINGS: Post treatment scarring extends superiorly from the right hilum, with volume loss in the right upper lobe. Remainder of the lungs is clear. No pleural effusion or pneumothorax. Cardiac silhouette is normal in size. No mediastinal or hilar masses. Skeletal structures are grossly intact. IMPRESSION: 1. No acute cardiopulmonary disease. Electronically Signed   By: Lajean Manes M.D.   On: 08/25/2021 16:43    EKG: Independently reviewed.  Sinus tach, no acute ST changes  Assessment/Plan Principal Problem:   AMS (altered mental status)  (please populate well all problems here in Problem List. (For example, if patient is on BP meds at home and you resume or decide to hold them, it is a problem that needs to be her. Same for CAD, COPD, HLD and so on)  Delirium along with left-sided paresis -Suspect stroke -Given the Hx of lung CA, ordered MRI with and without contrast. -Neurochecks frequently -Allow permissive hypertension for today -If MRI positive for stroke, will order further CTA versus MRI and other stroke work-up. -Othter Ddx, UA pending, will send TSH, B12 and RPR and ammonia. -NPO, IVF while waiting for speech evaluation.  HTN, uncontrolled -Secondary to noncompliance -Restart metoprolol due to significant tachycardia, hold other BP meds.  Allow permissive hypertension for 48 hours, add as needed labetalol for BP> 200/110.  History of non-small cell lung CA -Annual chest CT and follow-up with oncology.  Last visit was June 2022, CT chest without contrast due to contrast shortage.  Subacute versus chronic dysphagia -Consult speech therapist.  DVT prophylaxis: Lovenox Code Status: Full code Family Communication: Sister at bedside Disposition Plan:  Expect more than 2 midnight hospital stay. Consults called: Neurology Admission status: Tele admit   Lequita Halt MD [facility] Pager [phone]  08/25/2021, 6:48 PM

## 2021-08-25 NOTE — ED Provider Notes (Signed)
[facility] EMERGENCY DEPARTMENT Provider Note   CSN: [id number] Arrival date & time: 08/25/21  1601     History  Chief Complaint  Patient presents with   Altered Mental Status    [name] [name] is a 71 y.o. female.   Altered Mental Status Associated symptoms: headaches   Associated symptoms: no abdominal pain and no fever    Patient has a history of COPD, lung cancer and depression who presents with altered mental status.  Patient was checked by her family members yesterday.  They noted that she seemed a little bit confused and had some difficulty with her speech.  The symptoms persisted today and so they called 911 to have her evaluated at the hospital.  Patient states she is having a bit of a headache.  She is not aware of feeling confused.  She denies any complaints with any focal numbness or weakness.  However she does seem to be having some trouble with her vision on the left side of her body.  She denies any fevers.  She denies any recent injuries.  Patient states she does drink alcohol regularly but denies any alcohol today  Home Medications Prior to Admission medications   Medication Sig Start Date End Date Taking? Authorizing Provider  acetaminophen (TYLENOL) 325 MG tablet Take 650 mg by mouth every 6 (six) hours as needed for mild pain, moderate pain or fever.    [provider]  aspirin EC 81 MG tablet Take 81 mg daily by mouth.    [provider]  Cholecalciferol (VITAMIN D3) 2000 UNITS capsule Take 4,000 Units by mouth daily.     [provider]  citalopram (CELEXA) 20 MG tablet Take 1 tablet (20 mg total) by mouth daily. 08/07/21   Colon Branch, MD  COVID-19 mRNA vaccine, Moderna, (MODERNA COVID-19 VACCINE) 100 MCG/0.5ML injection Inject into the muscle. 12/10/20   Carlyle Basques, MD  Cyanocobalamin 2500 MCG CHEW Chew 2 tablets by mouth daily.    [provider]  lisinopril (ZESTRIL) 2.5 MG tablet Take 1  tablet (2.5 mg total) by mouth daily. 04/11/21   Hilty, Nadean Corwin, MD  metoprolol tartrate (LOPRESSOR) 25 MG tablet TAKE 1 TABLET(25 MG) BY MOUTH TWICE DAILY 08/20/21   Colon Branch, MD  ondansetron (ZOFRAN-ODT) 8 MG disintegrating tablet  09/19/20   [provider]  raloxifene (EVISTA) 60 MG tablet Take 1 tablet (60 mg total) by mouth daily. 04/03/20   Hilty, Nadean Corwin, MD  REPATHA SURECLICK 474 MG/ML SOAJ INJECT 140 MG( 1 ML) UNDER THE SKIN EVERY 14 DAYS 02/04/21   Hilty, Nadean Corwin, MD  rosuvastatin (CRESTOR) 40 MG tablet Take 1 tablet (40 mg total) by mouth at bedtime. 07/09/21   Pixie Casino, MD      Allergies    Mosquito (diagnostic), Tramadol hcl, Codeine, and Penicillins    Review of Systems   Review of Systems  Constitutional:  Negative for fever.  Cardiovascular:  Negative for chest pain.  Gastrointestinal:  Negative for abdominal pain.  Neurological:  Positive for speech difficulty and headaches.   Physical Exam Updated Vital Signs BP (!) 153/80    Pulse (!) 114    Temp 100 F (37.8 C) (Oral)    Resp 19    SpO2 99%  Physical Exam Vitals and nursing note reviewed.  Constitutional:      General: She is not in acute distress.    Appearance: She is well-developed.  HENT:  Head: Normocephalic and atraumatic.     Right Ear: External ear normal.     Left Ear: External ear normal.  Eyes:     General: No scleral icterus.       Right eye: No discharge.        Left eye: No discharge.     Conjunctiva/sclera: Conjunctivae normal.  Neck:     Trachea: No tracheal deviation.  Cardiovascular:     Rate and Rhythm: Normal rate and regular rhythm.  Pulmonary:     Effort: Pulmonary effort is normal. No respiratory distress.     Breath sounds: Normal breath sounds. No stridor. No wheezing or rales.  Abdominal:     General: Bowel sounds are normal. There is no distension.     Palpations: Abdomen is soft.     Tenderness: There is no abdominal tenderness. There is no  guarding or rebound.  Musculoskeletal:        General: No tenderness.     Cervical back: Neck supple.  Skin:    General: Skin is warm and dry.     Findings: No rash.  Neurological:     Mental Status: She is alert and oriented to person, place, and time.     Cranial Nerves: Cranial nerve deficit present.     Sensory: No sensory deficit.     Motor: No abnormal muscle tone or seizure activity.     Coordination: Coordination normal.     Comments: No pronator drift bilateral upper extrem, able to hold both legs off bed for 5 seconds, sensation intact in all extremities, pt will not look to the left,  has difficulty seeing me as I stand on the left side of her, no nystagmus noted, no aphasia  No facial droop, extraocular movements intact,     ED Results / Procedures / Treatments   Labs (all labs ordered are listed, but only abnormal results are displayed) Labs Reviewed  CBC - Abnormal; Notable for the following components:      Result Value   WBC 11.1 (*)    Hemoglobin 15.1 (*)    All other components within normal limits  DIFFERENTIAL - Abnormal; Notable for the following components:   Neutro Abs 9.6 (*)    All other components within normal limits  COMPREHENSIVE METABOLIC PANEL - Abnormal; Notable for the following components:   Glucose, Bld 134 (*)    Creatinine, Ser 1.09 (*)    GFR, Estimated 55 (*)    All other components within normal limits  CBG MONITORING, ED - Abnormal; Notable for the following components:   Glucose-Capillary 141 (*)    All other components within normal limits  I-STAT CHEM 8, ED - Abnormal; Notable for the following components:   Sodium 134 (*)    Glucose, Bld 128 (*)    Calcium, Ion 1.09 (*)    Hemoglobin 16.0 (*)    HCT 47.0 (*)    All other components within normal limits  RESP PANEL BY RT-PCR (FLU A&B, COVID) ARPGX2  PROTIME-INR  APTT  ETHANOL  RAPID URINE DRUG SCREEN, HOSP PERFORMED  URINALYSIS, ROUTINE W REFLEX MICROSCOPIC    EKG EKG  Interpretation  Date/Time:  Sunday August 25 2021 16:03:22 EST Ventricular Rate:  115 PR Interval:    QRS Duration: 88 QT Interval:  339 QTC Calculation: 469 R Axis:   75 Text Interpretation: Sinus rhythm Low voltage, precordial leads Poor data quality Confirmed by Dorie Rank [phone]) on 08/25/2021 4:06:27 PM  Radiology CT HEAD  WO CONTRAST  Result Date: 08/25/2021 CLINICAL DATA:  Neuro deficit, acute, stroke suspected EXAM: CT HEAD WITHOUT CONTRAST TECHNIQUE: Contiguous axial images were obtained from the base of the skull through the vertex without intravenous contrast. COMPARISON:  MRI head 09/16/2011 BRAIN: BRAIN Cerebral ventricle sizes are concordant with the degree of cerebral volume loss. No evidence of large-territorial acute infarction. No parenchymal hemorrhage. No mass lesion. No extra-axial collection. No mass effect or midline shift. No hydrocephalus. Basilar cisterns are patent. Vascular: No hyperdense vessel. Skull: No acute fracture or focal lesion. Sinuses/Orbits: Paranasal sinuses and mastoid air cells are clear. The orbits are unremarkable. Other: None. IMPRESSION: No acute intracranial abnormality. Electronically Signed   By: Iven Finn M.D.   On: 08/25/2021 16:47   DG Chest Portable 1 View  Result Date: 08/25/2021 CLINICAL DATA:  Weakness.  Confusion.  History of lung carcinoma. EXAM: PORTABLE CHEST 1 VIEW COMPARISON:  03/29/2012.  Chest CT, 01/29/2021. FINDINGS: Post treatment scarring extends superiorly from the right hilum, with volume loss in the right upper lobe. Remainder of the lungs is clear. No pleural effusion or pneumothorax. Cardiac silhouette is normal in size. No mediastinal or hilar masses. Skeletal structures are grossly intact. IMPRESSION: 1. No acute cardiopulmonary disease. Electronically Signed   By: Lajean Manes M.D.   On: 08/25/2021 16:43    Procedures Procedures    Medications Ordered in ED Medications  sodium chloride 0.9 % bolus 500 mL (0 mLs  Intravenous Stopped 08/25/21 1704)    Followed by  0.9 %  sodium chloride infusion (100 mL/hr Intravenous New Bag/Given 08/25/21 1704)  prochlorperazine (COMPAZINE) injection 10 mg (has no administration in time range)  ketorolac (TORADOL) 30 MG/ML injection 15 mg (has no administration in time range)  thiamine (B-1) injection 100 mg (100 mg Intravenous Given 08/25/21 1725)    ED Course/ Medical Decision Making/ A&P Clinical Course as of 08/25/21 1730  Sun Aug 25, 2021  1618 Symptoms ongoing for over 24 hours.  Out side any acute stroke window. [JK]  1700 Head CT without acute findings [JK]  1701 Chest x-ray without acute findings [JK]  1727 Patient is having headache.  States she does have a history of migraines.  Requesting a dose of medications. [UT]  6546 Discussed case with neurology, Dr. Theda Sers.  Neurology service will evaluate the patient [JK]    Clinical Course User Index [JK] Dorie Rank, MD                           Medical Decision Making  Patient presents to the ED for evaluation of possible stroke symptoms.  History provided by the patient as well as her sister who is here at the bedside.  Patient is having neurologic deficits including speech issues and visual field cuts.  She appears to be only looking towards her right side.  Patient is alert and awake but confused.  No acute abnormalities on labs to account for her symptoms.  Patient's head CT does not show any evidence of hemorrhage or acute stroke.  Still concerned about the possibility of acute stroke with her complaints versus complex migraine.  Patient does have history of migraines.  Would trial a migraine cocktail.  Have consulted with neurology.  I will consult the medical service for admission and further treatment.  Anticipate she will need MRI.        Final Clinical Impression(s) / ED Diagnoses Final diagnoses:  Visual field defect  Acute  focal neurological deficit    Rx / DC Orders ED Discharge Orders      None         Dorie Rank, MD 08/25/21 1731

## 2021-08-26 ENCOUNTER — Inpatient Hospital Stay (HOSPITAL_COMMUNITY): Payer: Medicare Other

## 2021-08-26 DIAGNOSIS — R531 Weakness: Secondary | ICD-10-CM

## 2021-08-26 DIAGNOSIS — H53462 Homonymous bilateral field defects, left side: Secondary | ICD-10-CM

## 2021-08-26 DIAGNOSIS — R29818 Other symptoms and signs involving the nervous system: Secondary | ICD-10-CM

## 2021-08-26 LAB — VITAMIN D 25 HYDROXY (VIT D DEFICIENCY, FRACTURES): Vit D, 25-Hydroxy: 50.58 ng/mL (ref 30–100)

## 2021-08-26 LAB — RPR: RPR Ser Ql: NONREACTIVE

## 2021-08-26 LAB — HEMOGLOBIN A1C
Hgb A1c MFr Bld: 5 % (ref 4.8–5.6)
Mean Plasma Glucose: 96.8 mg/dL

## 2021-08-26 LAB — LIPID PANEL
Cholesterol: 193 mg/dL (ref 0–200)
HDL: 67 mg/dL (ref >40)
LDL Cholesterol: 98 mg/dL (ref 0–99)
Total CHOL/HDL Ratio: 2.9 RATIO
Triglycerides: 138 mg/dL (ref <150)
VLDL: 28 mg/dL (ref 0–40)

## 2021-08-26 MED ORDER — GADOBUTROL 1 MMOL/ML IV SOLN
7.1000 mL | Freq: Once | INTRAVENOUS | Status: AC | PRN
  Administered 2021-08-26: 7.1 mL via INTRAVENOUS

## 2021-08-26 MED ORDER — THIAMINE HCL 100 MG/ML IJ SOLN
250.0000 mg | Freq: Every day | INTRAVENOUS | Status: DC
  Administered 2021-08-28 – 2021-08-30 (×3): 250 mg via INTRAVENOUS
  Filled 2021-08-26 (×4): qty 2.5

## 2021-08-26 MED ORDER — CLOPIDOGREL BISULFATE 75 MG PO TABS
75.0000 mg | ORAL_TABLET | Freq: Every day | ORAL | Status: DC
  Administered 2021-08-26: 75 mg via ORAL
  Filled 2021-08-26: qty 1

## 2021-08-26 MED ORDER — CYANOCOBALAMIN 1000 MCG/ML IJ SOLN
1000.0000 ug | Freq: Once | INTRAMUSCULAR | Status: AC
  Administered 2021-08-26: 1000 ug via INTRAMUSCULAR
  Filled 2021-08-26: qty 1

## 2021-08-26 MED ORDER — HYDRALAZINE HCL 20 MG/ML IJ SOLN: 10.0000 mg | Freq: Four times a day (QID) | INTRAMUSCULAR | Status: DC | PRN

## 2021-08-26 MED ORDER — BUTALBITAL-APAP-CAFFEINE 50-325-40 MG PO TABS
1.0000 | ORAL_TABLET | Freq: Four times a day (QID) | ORAL | Status: DC | PRN
  Administered 2021-08-29: 1 via ORAL
  Filled 2021-08-26: qty 1

## 2021-08-26 MED ORDER — THIAMINE HCL 100 MG/ML IJ SOLN: 100.0000 mg | Freq: Every day | INTRAMUSCULAR | Status: DC

## 2021-08-26 MED ORDER — THIAMINE HCL 100 MG/ML IJ SOLN
500.0000 mg | Freq: Three times a day (TID) | INTRAVENOUS | Status: AC
  Administered 2021-08-26 – 2021-08-28 (×6): 500 mg via INTRAVENOUS
  Filled 2021-08-26 (×6): qty 5

## 2021-08-26 MED ORDER — CARVEDILOL 3.125 MG PO TABS
3.1250 mg | ORAL_TABLET | Freq: Two times a day (BID) | ORAL | Status: DC
  Administered 2021-08-26 – 2021-08-28 (×4): 3.125 mg via ORAL
  Filled 2021-08-26 (×4): qty 1

## 2021-08-26 NOTE — Evaluation (Signed)
Speech Language Pathology Evaluation Patient Details Name: [name] [name] MRN: [id number] DOB: 1951-06-01 Today's Date: 08/26/2021 Time: 6568-1275 SLP Time Calculation (min) (ACUTE ONLY): 24 min  Problem List:  Patient Active Problem List   Diagnosis Date Noted   AMS (altered mental status) 08/25/2021   Benign essential HTN    Hyperglycemia    Urinary incontinence    Slow transit constipation    Pelvic pain    Closed pelvic fracture ([place]) 08/03/2020   Closed fracture of left inferior pubic ramus ([place]) 07/30/2020   Closed fracture of left superior pubic ramus ([place]) 07/30/2020   Left hip pain 07/30/2020   GAD (generalized anxiety disorder) 07/30/2020   DJD (degenerative joint disease) 09/20/2019   Familial hyperlipidemia 07/01/2018   Right shoulder pain 07/14/2017   Malignant melanoma of skin of canthus of right eye ([place]) 03/16/2017   PCP NOTES >>>>>>>>>>>>>>> 05/22/2015   High coronary artery calcium score 02/14/2015   Hernia, abdominal 04/01/2013   Depression with anxiety 04/01/2012   Malignant neoplasm of upper lobe, bronchus or lung 09/30/2011   H/O: lung cancer 09/25/2011   Annual physical exam 09/05/2011   IBS -diarrhea predominant 09/05/2011   COPD (chronic obstructive pulmonary disease) ([place]) 09/05/2011   Osteoporosis 03/01/2010   TOBACCO ABUSE 11/14/2008   Dyslipidemia 05/18/2007   Essential hypertension 05/18/2007   Past Medical History:  Past Medical History:  Diagnosis Date   Anemia    duering cancer treatment    Anxiety    Arthritis    OA hands    Bilateral lower extremity pain    Blood transfusion without reported diagnosis    during chemo for lung cancer   Cataract    forming  but very small    Cholelithiasis    Colon polyps    adenomatous   High cholesterol    History of chemotherapy    for lung cancer- completed 2013   Hx of radiation therapy 10/07/11 to 11/20/11   right lung   Hx of radiation therapy 01/07/2012- 01/21/12   cranial  irradiation   Hypertension    Lung cancer ([place]) 09/19/11   Stage III currently in remission   Malignant melanoma of skin of canthus of right eye ([place]) 02/2017   Osteoporosis 04/2014   Dexa scan by Dr. Corinna Capra, T-score -2.7, next in 2 years, with at least 13 % major fracture in 10 years   Pitting edema    Bilateral lower extremities, duplex ultrasound 04/03/2015 normal, no DVT   Past Surgical History:  Past Surgical History:  Procedure Laterality Date   CHOLECYSTECTOMY  04/2011   biliary stent placement   COLONOSCOPY     INCISIONAL HERNIA REPAIR  2015   MOHS SURGERY Right 02/2017   cheek and eyelid   Josph Macho Touch     Vaginal procedure   POLYPECTOMY     TUBAL LIGATION     HPI:  71 y.o. female with medical history significant of ocular migraines, HTN noncompliant with BP meds, HLD, non-small cell lung cancer 2013 status post lobectomy, chemotherapy and prophylactic cranial irradiation same year, and has been on observation with oncology, anxiety/depression, alcohol abuse, presented with after mentations.     Patient is confused, most history provided by patient's sister at bedside.  Patient lives by herself with sister stops by regularly.  Sister reported patient started to have gait problems since yesterday, and sister thinks patient has a newly developed left-sided weakness, with limping toward the left side; MRI 08/26/21 No acute or reversible  finding. No evidence of metastatic  disease.  2. Cerebral atrophy; Repeat MRI pending; CXR 08/25/21 negative for acute processes: Pt failed [facility] swallow screen, so BSE/SLE ordered.   Assessment / Plan / Recommendation Clinical Impression  Pt assessed via SLUMS ([place] Mental Status Examination) with a score obtained of 9/30 with deficits noted within the areas of memory, attention, orientation, organization and executive function.  Simple calculation, repeating digits backwards, various naming tasks (convergent/divergent/confrontation) and  simple problem solving functional tasks (during oral care, difficulty following directives without mod cueing).  L inattention observed with cues provided and continued inattention noted throughout evaluation impacting overall performance with tasks.  Low intensity volume noted within simple conversation, but speech was intelligible.  Pt was min lethargic intermittently requiring mod-max cues to participate which may also have impeded performance.  ST will f/u for above deficits in acute setting.  Thank you for this consult.    SLP Assessment  SLP Recommendation/Assessment: Patient needs continued Speech Language Pathology Services SLP Visit Diagnosis: Attention and concentration deficit;Cognitive communication deficit (R41.841) Attention and concentration deficit following: Other cerebrovascular disease    Recommendations for follow up therapy are one component of a multi-disciplinary discharge planning process, led by the attending physician.  Recommendations may be updated based on patient status, additional functional criteria and insurance authorization.    Follow Up Recommendations  Acute inpatient rehab (3hours/day)    Assistance Recommended at Discharge  Frequent or constant Supervision/Assistance  Functional Status Assessment Patient has had a recent decline in their functional status and demonstrates the ability to make significant improvements in function in a reasonable and predictable amount of time.  Frequency and Duration min 2x/week  1 week      SLP Evaluation Cognition  Overall Cognitive Status: Impaired/Different from baseline Arousal/Alertness: Awake/alert Orientation Level: Oriented to person;Oriented to place;Oriented to time;Disoriented to situation Year: 2023 Month: January Day of Week: Incorrect Attention: Sustained Sustained Attention: Impaired Sustained Attention Impairment: Verbal basic;Functional basic Memory: Impaired Memory Impairment: Retrieval  deficit;Decreased recall of new information;Decreased short term memory Decreased Short Term Memory: Verbal basic;Functional basic Immediate Memory Recall: Sock;Blue;Bed Memory Recall Sock: Without Cue Memory Recall Blue: With Cue Memory Recall Bed: Not able to recall Awareness: Impaired Awareness Impairment: Emergent impairment;Intellectual impairment Problem Solving: Impaired Problem Solving Impairment: Verbal basic;Functional basic Executive Function: Reasoning;Organizing;Decision Making Reasoning: Impaired Reasoning Impairment: Verbal basic;Functional basic Organizing: Impaired Organizing Impairment: Verbal basic;Functional basic Decision Making: Impaired Decision Making Impairment: Verbal basic;Functional basic Behaviors: Perseveration Safety/Judgment: Impaired       Comprehension  Auditory Comprehension Overall Auditory Comprehension: Impaired Yes/No Questions: Within Functional Limits Commands: Impaired Multistep Basic Commands: 25-49% accurate Conversation: Simple Interfering Components: Attention;Processing speed;Working Field seismologist: Conservation officer, nature: Not tested Reading Comprehension Reading Status: Not tested    Expression Expression Primary Mode of Expression: Verbal Verbal Expression Overall Verbal Expression: Impaired Level of Generative/Spontaneous Verbalization: Conversation Repetition: Impaired Level of Impairment: Word level Naming: Impairment Responsive: 51-75% accurate Confrontation: Impaired Convergent: 50-74% accurate Divergent: 50-74% accurate Verbal Errors: Perseveration Pragmatics: Unable to assess Interfering Components: Attention Non-Verbal Means of Communication: Not applicable Written Expression Dominant Hand: Right Written Expression: Unable to assess (comment) (pt compliance)   Oral / Motor  Oral Motor/Sensory Function Overall Oral Motor/Sensory  Function: Within functional limits Motor Speech Overall Motor Speech: Appears within functional limits for tasks assessed Respiration: Within functional limits Phonation: Low vocal intensity Resonance: Within functional limits Articulation: Within functional limitis Intelligibility: Intelligible Motor Planning: Witnin functional limits Motor Speech Errors:  Not applicable            Elvina Sidle, M.S., CCC-SLP 08/26/2021, 11:25 AM

## 2021-08-26 NOTE — TOC Initial Note (Signed)
Transition of Care [facility]) - Initial/Assessment Note    Patient Details  Name: [name] [name] MRN: [id number] Date of Birth: 1951-07-21  [facility]) CM/SW Contact:    Pollie Friar, RN Phone Number: 08/26/2021, 3:16 PM  Clinical Narrative:                 Patient is from home alone but has a sister that checks in on her and provides needed transportation and medication assistance.  Recommendations are for CIR. CM awaiting their assessment. TOC following.  Expected Discharge Plan: IP Rehab Facility Barriers to Discharge: Continued Medical Work up   Patient Goals and CMS Choice   CMS Medicare.gov Compare Post Acute Care list provided to:: Patient Represenative (must comment) Choice offered to / list presented to : Patient, Sibling  Expected Discharge Plan and Services Expected Discharge Plan: [place]   Discharge Planning Services: CM Consult Post Acute Care Choice: IP Rehab Living arrangements for the past 2 months: Single Family Home                                      Prior Living Arrangements/Services Living arrangements for the past 2 months: Single Family Home Lives with:: Self Patient language and need for interpreter reviewed:: Yes Do you feel safe going back to the place where you live?: Yes          Current home services: DME (walker/ cane/ 3 in 1/ shower seat) Criminal Activity/Legal Involvement Pertinent to Current Situation/Hospitalization: No - Comment as needed  Activities of Daily Living Home Assistive Devices/Equipment: Eyeglasses, Wheelchair, Environmental consultant (specify type), Grab bars in shower ADL Screening (condition at time of admission) Patient's cognitive ability adequate to safely complete daily activities?: Yes Is the patient deaf or have difficulty hearing?: No Does the patient have difficulty seeing, even when wearing glasses/contacts?: Yes Does the patient have difficulty concentrating, remembering, or making  decisions?: Yes Patient able to express need for assistance with ADLs?: No Does the patient have difficulty dressing or bathing?: Yes Independently performs ADLs?: No Communication: Needs assistance Is this a change from baseline?: Change from baseline, expected to last <3 days Dressing (OT): Needs assistance Is this a change from baseline?: Change from baseline, expected to last <3days Grooming: Needs assistance Is this a change from baseline?: Change from baseline, expected to last <3 days Feeding: Needs assistance Is this a change from baseline?: Change from baseline, expected to last <3 days Bathing: Needs assistance Is this a change from baseline?: Change from baseline, expected to last <3 days Toileting: Needs assistance Is this a change from baseline?: Change from baseline, expected to last <3 days In/Out Bed: Dependent Is this a change from baseline?: Change from baseline, expected to last <3 days Walks in Home: Needs assistance Is this a change from baseline?: Change from baseline, expected to last <3 days Does the patient have difficulty walking or climbing stairs?: Yes Weakness of Legs: Both Weakness of Arms/Hands: Both  Permission Sought/Granted                  Emotional Assessment Appearance:: Appears stated age Attitude/Demeanor/Rapport: Engaged Affect (typically observed): Accepting Orientation: : Oriented to Self, Oriented to Place, Oriented to  Time Alcohol / Substance Use: Alcohol Use Psych Involvement: No (comment)  Admission diagnosis:  Visual field defect [H53.40] Acute focal neurological deficit [R29.818] AMS (altered mental status) [R41.82] Patient Active Problem  List   Diagnosis Date Noted   AMS (altered mental status) 08/25/2021   Benign essential HTN    Hyperglycemia    Urinary incontinence    Slow transit constipation    Pelvic pain    Closed pelvic fracture ([place]) 08/03/2020   Closed fracture of left inferior pubic ramus ([place]) 07/30/2020    Closed fracture of left superior pubic ramus ([place]) 07/30/2020   Left hip pain 07/30/2020   GAD (generalized anxiety disorder) 07/30/2020   DJD (degenerative joint disease) 09/20/2019   Familial hyperlipidemia 07/01/2018   Right shoulder pain 07/14/2017   Malignant melanoma of skin of canthus of right eye ([place]) 03/16/2017   PCP NOTES >>>>>>>>>>>>>>> 05/22/2015   High coronary artery calcium score 02/14/2015   Hernia, abdominal 04/01/2013   Depression with anxiety 04/01/2012   Malignant neoplasm of upper lobe, bronchus or lung 09/30/2011   H/O: lung cancer 09/25/2011   Annual physical exam 09/05/2011   IBS -diarrhea predominant 09/05/2011   COPD (chronic obstructive pulmonary disease) ([place]) 09/05/2011   Osteoporosis 03/01/2010   TOBACCO ABUSE 11/14/2008   Dyslipidemia 05/18/2007   Essential hypertension 05/18/2007   PCP:  Colon Branch, MD Pharmacy:   [facility] [phone], [address] AT [facility] [address] [place] [address] Phone: [phone] Fax: [phone]  [facility] [place], [place] [address] Phone: [phone] Fax: [phone]     Social Determinants of Health (SDOH) Interventions    Readmission Risk Interventions Readmission Risk Prevention Plan 08/01/2020  Post Dischage Appt Complete  Medication Screening Complete  Transportation Screening Complete  Some recent data might be hidden

## 2021-08-26 NOTE — Progress Notes (Signed)
PROGRESS NOTE  [name] [name]  DOB: Jun 01, 1951  PCP: Colon Branch, MD JJO:[id number]  DOA: 08/25/2021  LOS: 1 day  Hospital Day: 2  Chief Complaint  Patient presents with   Altered Mental Status   Brief narrative: [name] [name] is a 71 y.o. female with PMH significant for HTN, HLD, ocular migraine, non-small cell lung cancer in 2013 status post lobectomy, chemo and prophylactic cranial irradiation, noncompliance to medications who lives by self and gets regular stops by her sister. Patient presented to the ED on 08/25/2021 with altered mental status, and new left-sided weakness causing gait impairment.  Per sister, patient lately was complaining about difficulty swallowing pills and had choking episodes while eating.  In the ED, patient had a temperature of 100, heart rate elevated persistently to over 110, blood pressure in 150s, breathing on room air CT scan of head did not show any acute intracranial abnormality MRI brain showed cerebral atrophy but did not show any acute reversible finding, no evidence of metastatic disease. Admitted to hospitalist service,  Subjective: Patient was seen and examined this morning.  Pleasant elderly Caucasian female.  Sitting up in chair.  Not in distress.  Oriented to place and person but not to time.  No family at bedside.  Assessment/Plan: Left-sided weakness -Work-up started in the ED as possible stroke.  However CT scan of head and MRI brain did not show any acute finding or evidence of metastasis. -Neuro consult pending. -Pending urinalysis -TSH slightly elevated, vitamin B12 normal, ammonia level normal, RPR pending -Currently n.p.o. for speech eval, continue IV fluid -Continue to monitor mental status change  Chronic dysphagia -Patient states that she has chronic dysphagia.  She was told in the past by her dentist that she has a large uvula.  Patient denies any history of esophageal stretching. -Seen by speech therapy this  morning.  Regular diet started.   Hypertension, sinus tachycardia -Noncompliant to medications at home.   -I will start her on oral Coreg to address both tachycardia and hypertension. -IV hydralazine as needed.    HLD -HDL 67, LDL 98   History of non-small cell lung CA -non-small cell lung cancer in 2013 status post lobectomy, chemo and prophylactic cranial irradiation -Annual chest CT and follow-up with oncology.  Last visit was June 2022, CT chest without contrast due to contrast shortage.  Vitamin B12 deficiency -Continue vitamin B12 supplement.   Mobility: PT evaluation obtained.  CIR recommended. Living condition: Lives at home alone Goals of care:   Code Status: Full Code  Nutritional status: There is no height or weight on file to calculate BMI.      Diet:  Diet Order             Diet regular Room service appropriate? No; Fluid consistency: Thin  Diet effective now                  DVT prophylaxis:  enoxaparin (LOVENOX) injection 40 mg Start: 08/25/21 1830   Antimicrobials: None Fluid: Stop IV fluid Consultants: Neurology Family Communication: None at bedside  Status is: Inpatient  Continue in-hospital care because: CIR pending Level of care: Telemetry Medical   Dispo: The patient is from: Home              Anticipated d/c is to: CIR              Patient currently is not medically stable to d/c.   Difficult to place patient No  Infusions:   sodium chloride Stopped (08/25/21 1838)   thiamine injection 500 mg (08/26/21 1138)   Followed by   Derrill Memo ON 08/28/2021] thiamine injection      Scheduled Meds:   stroke: mapping our early stages of recovery book   Does not apply Once   aspirin EC  81 mg Oral Daily   carvedilol  3.125 mg Oral BID WC   cholecalciferol  4,000 Units Oral Daily   citalopram  20 mg Oral Daily   clopidogrel  75 mg Oral Daily   diphenhydrAMINE  12.5 mg Intravenous Once   enoxaparin (LOVENOX) injection  40 mg Subcutaneous  I34V   folic acid  1 mg Oral Daily   multivitamin with minerals  1 tablet Oral Daily   [START ON 09/03/2021] thiamine injection  100 mg Intravenous Daily   vitamin B-12  2,000 mcg Oral Daily    PRN meds: acetaminophen **OR** acetaminophen (TYLENOL) oral liquid 160 mg/5 mL **OR** acetaminophen, acetaminophen, hydrALAZINE, labetalol, LORazepam **OR** LORazepam, metoprolol tartrate, senna-docusate   Antimicrobials: Anti-infectives (From admission, onward)    None       Objective: Vitals:   08/26/21 0749 08/26/21 1233  BP: 105/75 139/90  Pulse: (!) 103 (!) 107  Resp: 18 18  Temp: 98.5 F (36.9 C) 98.4 F (36.9 C)  SpO2: 98% 100%    Intake/Output Summary (Last 24 hours) at 08/26/2021 1429 Last data filed at 08/26/2021 1129 Gross per 24 hour  Intake 1129.56 ml  Output 250 ml  Net 879.56 ml   There were no vitals filed for this visit. Weight change:  There is no height or weight on file to calculate BMI.   Physical Exam: General exam: Pleasant, elderly Caucasian female.  Not in physical distress Skin: No rashes, lesions or ulcers. HEENT: Atraumatic, normocephalic, no obvious bleeding Lungs: Clear to auscultation bilaterally CVS: Regular rate and rhythm, no murmur GI/Abd soft, nontender, nondistended, bowel sound present CNS: Alert, awake, oriented to place and person, not to time Psychiatry: Mood appropriate Extremities: No pedal edema, no calf tenderness  Data Review: I have personally reviewed the laboratory data and studies available.  F/u labs ordered Unresulted Labs (From admission, onward)     Start     Ordered   08/27/21 0500  Vitamin E  Tomorrow morning,   R        08/26/21 0742   08/25/21 1613  Urine rapid drug screen (hosp performed)  ONCE - STAT,   STAT        08/25/21 1613   08/25/21 1613  Urinalysis, Routine w reflex microscopic  ONCE - STAT,   STAT        08/25/21 1613   Unscheduled  CBC with Differential/Platelet  Tomorrow morning,   R         08/26/21 1429   Unscheduled  Basic metabolic panel  Tomorrow morning,   R        08/26/21 1429            Signed, Terrilee Croak, MD [facility] 08/26/2021

## 2021-08-26 NOTE — TOC CAGE-AID Note (Signed)
Transition of Care [facility]) - CAGE-AID Screening   Patient Details  Name: [name] [name] MRN: [id number] Date of Birth: February 07, 1951  [facility]) CM/SW Contact:    Pollie Friar, RN Phone Number: 08/26/2021, 3:14 PM   Clinical Narrative: Pt refused resources for inpatient/ outpatient alcohol counseling.   CAGE-AID Screening:    Have You Ever Felt You Ought to Cut Down on Your Drinking or Drug Use?: No Have People Annoyed You By Critizing Your Drinking Or Drug Use?: No Have You Felt Bad Or Guilty About Your Drinking Or Drug Use?: No Have You Ever Had a Drink or Used Drugs First Thing In The Morning to Steady Your Nerves or to Get Rid of a Hangover?: No CAGE-AID Score: 0  Substance Abuse Education Offered: Yes (pt refused)

## 2021-08-26 NOTE — Evaluation (Signed)
Clinical/Bedside Swallow Evaluation Patient Details  Name: SHAM ALVIAR MRN: [id number] Date of Birth: [birth year]  Today's Date: 08/26/2021 Time: SLP Start Time (ACUTE ONLY): 0910 SLP Stop Time (ACUTE ONLY): 2706 SLP Time Calculation (min) (ACUTE ONLY): 24 min  Past Medical History:  Past Medical History:  Diagnosis Date   Anemia    duering cancer treatment    Anxiety    Arthritis    OA hands    Bilateral lower extremity pain    Blood transfusion without reported diagnosis    during chemo for lung cancer   Cataract    forming  but very small    Cholelithiasis    Colon polyps    adenomatous   High cholesterol    History of chemotherapy    for lung cancer- completed 2013   Hx of radiation therapy 10/07/11 to 11/20/11   right lung   Hx of radiation therapy 01/07/2012- 01/21/12   cranial irradiation   Hypertension    Lung cancer ([place]) 09/19/11   Stage III currently in remission   Malignant melanoma of skin of canthus of right eye ([place]) 02/2017   Osteoporosis 04/2014   Dexa scan by Dr. Corinna Capra, T-score -2.7, next in 2 years, with at least 13 % major fracture in 10 years   Pitting edema    Bilateral lower extremities, duplex ultrasound 04/03/2015 normal, no DVT   Past Surgical History:  Past Surgical History:  Procedure Laterality Date   CHOLECYSTECTOMY  04/2011   biliary stent placement   COLONOSCOPY     INCISIONAL HERNIA REPAIR  2015   MOHS SURGERY Right 02/2017   cheek and eyelid   Josph Macho Touch     Vaginal procedure   POLYPECTOMY     TUBAL LIGATION     HPI:  71 y.o. female with medical history significant of ocular migraines, HTN noncompliant with BP meds, HLD, non-small cell lung cancer 2013 status post lobectomy, chemotherapy and prophylactic cranial irradiation same year, and has been on observation with oncology, anxiety/depression, alcohol abuse, presented with after mentations.     Patient is confused, most history provided by patient's sister at bedside.   Patient lives by herself with sister stops by regularly.  Sister reported patient started to have gait problems since yesterday, and sister thinks patient has a newly developed left-sided weakness, with limping toward the left side; MRI 08/26/21 No acute or reversible finding. No evidence of metastatic  disease.  2. Cerebral atrophy; Repeat MRI pending; CXR 08/25/21 negative for acute processes: Pt failed [facility] swallow screen, so BSE/SLE ordered.    Assessment / Plan / Recommendation  Clinical Impression  Pt evaluated via clinical swallowing assessment with inattention affecting intake with min verbal cues for smaller swallows and maintaining sustained attention when masticating/swallowing required with all consistencies.  Mild oral holding prior to swallow with thin via cup, but no overt s/s of aspiration noted throughout various trials of thin via tsp,cup and straw, puree and solids.  Pt demonstrated a delay in the initiation of the swallow d/t inattention/presbyphagia and this places her at mild risk for aspiration without general swallowing precautions and FULL supervision with meals, although she is able to self-feed, but will need assistance with set-up.  Recommend initiating a regular/thin liquid diet with general swallowing precautions in place d/t inattention/risk for aspiration.  ST will f/u for cognitive/linguistic deficits and dysphagia tx while in acute setting.  Thank you for this consult.  SLP Visit Diagnosis: Dysphagia, unspecified (R13.10)  Aspiration Risk  Mild aspiration risk    Diet Recommendation   Regular/thin liquids  Medication Administration: Whole meds with puree    Other  Recommendations Oral Care Recommendations: Oral care BID    Recommendations for follow up therapy are one component of a multi-disciplinary discharge planning process, led by the attending physician.  Recommendations may be updated based on patient status, additional functional criteria and insurance  authorization.  Follow up Recommendations Acute inpatient rehab (3hours/day)      Assistance Recommended at Discharge Frequent or constant Supervision/Assistance  Functional Status Assessment Patient has had a recent decline in their functional status and demonstrates the ability to make significant improvements in function in a reasonable and predictable amount of time.  Frequency and Duration min 2x/week  1 week       Prognosis Prognosis for Safe Diet Advancement: Good Barriers to Reach Goals: Cognitive deficits      Swallow Study   General Date of Onset: 08/25/21 HPI: 71 y.o. female with medical history significant of ocular migraines, HTN noncompliant with BP meds, HLD, non-small cell lung cancer 2013 status post lobectomy, chemotherapy and prophylactic cranial irradiation same year, and has been on observation with oncology, anxiety/depression, alcohol abuse, presented with after mentations.     Patient is confused, most history provided by patient's sister at bedside.  Patient lives by herself with sister stops by regularly.  Sister reported patient started to have gait problems since yesterday, and sister thinks patient has a newly developed left-sided weakness, with limping toward the left side; MRI 08/26/21 No acute or reversible finding. No evidence of metastatic  disease.  2. Cerebral atrophy; Repeat MRI pending; CXR 08/25/21 negative for acute processes: Pt failed [facility] swallow screen, so BSE/SLE ordered. Type of Study: Bedside Swallow Evaluation Previous Swallow Assessment: Failed [facility] swallow screen Diet Prior to this Study: NPO Temperature Spikes Noted: No Respiratory Status: Room air History of Recent Intubation: No Behavior/Cognition: Alert;Cooperative;Requires cueing Oral Cavity Assessment: Within Functional Limits Oral Care Completed by SLP: Yes Oral Cavity - Dentition: Adequate natural dentition Vision: Functional for self-feeding Self-Feeding Abilities: Able to feed  self;Needs assist Patient Positioning: Upright in chair Baseline Vocal Quality: Normal Volitional Cough: Weak Volitional Swallow: Able to elicit    Oral/Motor/Sensory Function Overall Oral Motor/Sensory Function: Within functional limits   Ice Chips Ice chips: Within functional limits Presentation: Spoon   Thin Liquid Thin Liquid: Impaired Presentation: Cup;Straw;Spoon Oral Phase Functional Implications: Oral holding;Other (comment) (brief; inattention) Pharyngeal  Phase Impairments: Suspected delayed Swallow    Nectar Thick Nectar Thick Liquid: Not tested   Honey Thick Honey Thick Liquid: Not tested   Puree Puree: Within functional limits Presentation: Self Fed   Solid     Solid: Impaired Presentation: Self Fed Oral Phase Functional Implications: Other (comment) (inattention) Pharyngeal Phase Impairments: Suspected delayed Swallow      Elvina Sidle, M.S., CCC-SLP 08/26/2021,10:05 AM

## 2021-08-26 NOTE — Evaluation (Signed)
Physical Therapy Evaluation Patient Details Name: [name] [name] MRN: [id number] DOB: [birth year] Today's Date: 08/26/2021  History of Present Illness  Pt is a 71 y/o female who presents with difficulty walking, L side weakness, R side neglect, AMS. CT negative and MRI currently pending. Pt admitted with supected stroke. PMH significant for lung cancer (stage III currently in remission s/p lobectomy, chemotherapy, and prophylactic cranial irradiation), melanoma, osteoporosis, HTN, ocular migraines.   Clinical Impression  Pt admitted with above diagnosis. Pt currently with functional limitations due to the deficits listed below (see PT Problem List). At the time of PT eval pt was able to perform transfers with +2 mod-max assist for balance support, safety, and facilitation LE management. Pt with significant L side visual field cut, L side inattention, motor/sensory apraxia effecting ability to complete functional tasks. Feel this patient would benefit from skilled multidisciplinary therapies at the AIR level to maximize functional independence and safety prior to return home with family support. Acutely, pt will benefit from skilled PT to increase their independence and safety with mobility to allow discharge to the venue listed below.          Recommendations for follow up therapy are one component of a multi-disciplinary discharge planning process, led by the attending physician.  Recommendations may be updated based on patient status, additional functional criteria and insurance authorization.  Follow Up Recommendations Acute inpatient rehab (3hours/day)    Assistance Recommended at Discharge Frequent or constant Supervision/Assistance  Functional Status Assessment Patient has had a recent decline in their functional status and demonstrates the ability to make significant improvements in function in a reasonable and predictable amount of time.  Equipment Recommendations  None recommended  by PT (TBD by next venue of care)    Recommendations for Other Services       Precautions / Restrictions Precautions Precautions: Fall Precaution Comments: L neglect Restrictions Weight Bearing Restrictions: No      Mobility  Bed Mobility Overal bed mobility: Needs Assistance Bed Mobility: Supine to Sit     Supine to sit: Mod assist     General bed mobility comments: Assist to facilitate full transition to EOB. Pt attempting to prop up on elbows and moved LE's towards EOB a little bit, but required assist to elevate trunk and bring LE's completely off EOB.    Transfers Overall transfer level: Needs assistance Equipment used: 2 person hand held assist Transfers: Sit to/from Stand;Bed to chair/wheelchair/BSC Sit to Stand: Mod assist;+2 physical assistance Stand pivot transfers: Max assist;+2 physical assistance Step pivot transfers: Max assist;+2 physical assistance       General transfer comment: Attempted to take pivotal steps however pt unable to coordinate and attempted to sit on bed rail/reclienr armrest and requried Max A to pivot her into the chair.    Ambulation/Gait               General Gait Details: Unable to progress to gait training this date  Stairs            Wheelchair Mobility    Modified Rankin (Stroke Patients Only) Modified Rankin (Stroke Patients Only) Pre-Morbid Rankin Score: No significant disability Modified Rankin: Moderately severe disability     Balance Overall balance assessment: Needs assistance Sitting-balance support: Feet supported;No upper extremity supported Sitting balance-Leahy Scale: Fair     Standing balance support: Bilateral upper extremity supported;During functional activity Standing balance-Leahy Scale: Poor  Pertinent Vitals/Pain Pain Assessment: No/denies pain    Home Living Family/patient expects to be discharged to:: Private residence Living  Arrangements: Alone Available Help at Discharge: Family;Available PRN/intermittently Type of Home: House Home Access: Level entry       Home Layout: One level Home Equipment: Shower seat;Cane - single point;Grab bars - toilet;Grab bars - tub/shower;Rolling Walker (2 wheels)      Prior Function Prior Level of Function : Needs assist  Cognitive Assist : ADLs (cognitive) (IADL tasks)             ADLs Comments: Pt lives alone with her dog; drives and completes IADL tasks; states her step-sister helps as needed     Hand Dominance   Dominant Hand: Right    Extremity/Trunk Assessment   Upper Extremity Assessment Upper Extremity Assessment: Defer to OT evaluation LUE Deficits / Details: moving LUE however unaware of location of LUE; appeaers to demosntrate motor sensory deficits/apraxic; unaware she was holding her gown in her L hand LUE Sensation: decreased light touch;decreased proprioception LUE Coordination: decreased fine motor;decreased gross motor    Lower Extremity Assessment Lower Extremity Assessment: LLE deficits/detail LLE Deficits / Details: Difficulty initiating movement and coordinating steps. Cognition limiting MMT but grossly at least 4-/5    Cervical / Trunk Assessment Cervical / Trunk Assessment: Other exceptions Cervical / Trunk Exceptions: L bias  Communication   Communication: No difficulties  Cognition Arousal/Alertness: Suspect due to medications;Awake/alert (mildly lethargic; had Ativan @ 3 am for MRI) Behavior During Therapy: Flat affect;Impulsive Overall Cognitive Status: Impaired/Different from baseline Area of Impairment: Attention;Orientation;Memory;Following commands;Safety/judgement;Awareness;Problem solving                 Orientation Level: Disoriented to Current Attention Level: Focused Memory: Decreased short-term memory Following Commands: Follows one step commands inconsistently Safety/Judgement: Decreased awareness of  safety;Decreased awareness of deficits Awareness: Intellectual Problem Solving: Slow processing;Decreased initiation;Difficulty sequencing;Requires verbal cues;Requires tactile cues General Comments: poor awareness of deficits Functional Status Assessment: Patient has had a recent decline in their functional status and demonstrates the ability to make significant improvements in function in a reasonable and predictable amount of time.      General Comments      Exercises     Assessment/Plan    PT Assessment Patient needs continued PT services  PT Problem List Decreased strength;Decreased activity tolerance;Decreased balance;Decreased mobility;Decreased cognition;Decreased knowledge of use of DME;Decreased coordination;Decreased safety awareness;Decreased knowledge of precautions       PT Treatment Interventions DME instruction;Gait training;Functional mobility training;Therapeutic activities;Therapeutic exercise;Neuromuscular re-education;Patient/family education    PT Goals (Current goals can be found in the Care Plan section)  Acute Rehab PT Goals Patient Stated Goal: "I don't want anyone to know I had a stroke" PT Goal Formulation: With patient Time For Goal Achievement: 09/09/21 Potential to Achieve Goals: Good    Frequency Min 4X/week   Barriers to discharge        Co-evaluation PT/OT/SLP Co-Evaluation/Treatment: Yes Reason for Co-Treatment: Complexity of the patient's impairments (multi-system involvement);Necessary to address cognition/behavior during functional activity;For patient/therapist safety;To address functional/ADL transfers PT goals addressed during session: Mobility/safety with mobility;Balance;Strengthening/ROM OT goals addressed during session: ADL's and self-care       AM-PAC PT "6 Clicks" Mobility  Outcome Measure Help needed turning from your back to your side while in a flat bed without using bedrails?: A Little Help needed moving from lying on  your back to sitting on the side of a flat bed without using bedrails?: A Lot Help needed moving  to and from a bed to a chair (including a wheelchair)?: Total Help needed standing up from a chair using your arms (e.g., wheelchair or bedside chair)?: A Lot Help needed to walk in hospital room?: Total Help needed climbing 3-5 steps with a railing? : Total 6 Click Score: 10    End of Session Equipment Utilized During Treatment: Gait belt Activity Tolerance: Patient tolerated treatment well Patient left: in chair;with call bell/phone within reach;with chair alarm set Nurse Communication: Mobility status PT Visit Diagnosis: Unsteadiness on feet (R26.81);Other symptoms and signs involving the nervous system (X43.568)    Time: 6168-3729 PT Time Calculation (min) (ACUTE ONLY): 25 min   Charges:   PT Evaluation $PT Eval Moderate Complexity: 1 Mod          Rolinda Roan, PT, DPT Acute Rehabilitation Services Pager: [phone] Office: [phone]   Thelma Comp 08/26/2021, 10:58 AM

## 2021-08-26 NOTE — Evaluation (Signed)
Occupational Therapy Evaluation Patient Details Name: [name] [name] MRN: [id number] DOB: 1951/03/30 Today's Date: 08/26/2021   History of Present Illness Pt is a 71 y/o female who presents with difficulty walking, L side weakness, R side neglect, AMS. CT negative and MRI currently pending. Pt admitted with supected stroke. PMH significant for lung cancer (stage III currently in remission s/p lobectomy, chemotherapy, and prophylactic cranial irradiation), melanoma, osteoporosis, HTN, ocular migraines.   Clinical Impression   PTA pt lives alone with her dog. Pt drives and is independent with IADL tasks, including driving, and states her step-sister checks on her and assists as needed. Pt presents with significant functional decline, requiring Max A +2 with stand pivot transfers and Max A with ADL tasks due to below listed deficits.  At this time recommend AIR for intensive rehab to maximize functional level of independence. Due to apparent L neglect and L field cut, please keep call bell located on R side. Will follow acutely.      Recommendations for follow up therapy are one component of a multi-disciplinary discharge planning process, led by the attending physician.  Recommendations may be updated based on patient status, additional functional criteria and insurance authorization.   Follow Up Recommendations  Acute inpatient rehab (3hours/day)    Assistance Recommended at Discharge Frequent or constant Supervision/Assistance  Functional Status Assessment  Patient has had a recent decline in their functional status and demonstrates the ability to make significant improvements in function in a reasonable and predictable amount of time.  Equipment Recommendations  BSC/3in1    Recommendations for Other Services Rehab consult     Precautions / Restrictions Precautions Precautions: Fall Precaution Comments: L neglect      Mobility Bed Mobility Overal bed mobility: Needs  Assistance Bed Mobility: Supine to Sit     Supine to sit: Mod assist          Transfers Overall transfer level: Needs assistance   Transfers: Sit to/from Stand;Bed to chair/wheelchair/BSC Sit to Stand: Mod assist;+2 physical assistance Stand pivot transfers: Max assist;+2 physical assistance         General transfer comment: Attempted to take pivotal steps however pt unable to coordinate and attempted to sit on bed rail/reclienr armrest adn requried Max A to pivot her into the chair.      Balance Overall balance assessment: Needs assistance   Sitting balance-Leahy Scale: Fair                                     ADL either performed or assessed with clinical judgement   ADL Overall ADL's : Needs assistance/impaired Eating/Feeding: NPO   Grooming: Maximal assistance   Upper Body Bathing: Maximal assistance       Upper Body Dressing : Maximal assistance   Lower Body Dressing: Maximal assistance   Toilet Transfer: Maximal assistance;+2 for physical assistance   Toileting- Clothing Manipulation and Hygiene: Maximal assistance       Functional mobility during ADLs: Maximal assistance;+2 for physical assistance       Vision Baseline Vision/History: 1 Wears glasses Vision Assessment?: Yes Eye Alignment: Within Functional Limits Ocular Range of Motion: Within Functional Limits Alignment/Gaze Preference: Gaze right Tracking/Visual Pursuits: Impaired - to be further tested in functional context;Decreased smoothness of horizontal tracking;Decreased smoothness of vertical tracking Saccades: Additional eye shifts occurred during testing;Decreased speed of saccadic movement (unable to track past midlien; difficulty visually attending to object) Visual Fields:  Left visual field deficit; does not see object until in R center filed; unable to read; will continue to assess     Perception     Praxis Praxis Praxis-Other Comments: difficulty sequencing  when brushing teeth    Pertinent Vitals/Pain Pain Assessment: No/denies pain     Hand Dominance Right   Extremity/Trunk Assessment Upper Extremity Assessment Upper Extremity Assessment: LUE deficits/detail LUE Deficits / Details: moving LUE however unaware of location of LUE; appeaers to demosntrate motor sensory deficits/apraxic; unaware she was holding her gown in her L hand LUE Sensation: decreased light touch;decreased proprioception LUE Coordination: decreased fine motor;decreased gross motor   Lower Extremity Assessment Lower Extremity Assessment: Defer to PT evaluation   Cervical / Trunk Assessment Cervical / Trunk Assessment: Other exceptions Cervical / Trunk Exceptions: L bias   Communication Communication Communication: No difficulties   Cognition Arousal/Alertness: Suspect due to medications;Awake/alert (mildle lethargic; had Ativan @ 3 am for MRI) Behavior During Therapy: Flat affect;Impulsive Overall Cognitive Status: Impaired/Different from baseline Area of Impairment: Attention;Orientation;Memory;Following commands;Safety/judgement;Awareness;Problem solving                 Orientation Level: Disoriented to Current Attention Level: Focused Memory: Decreased short-term memory Following Commands: Follows one step commands inconsistently Safety/Judgement: Decreased awareness of safety;Decreased awareness of deficits Awareness: Intellectual Problem Solving: Slow processing;Decreased initiation;Difficulty sequencing;Requires verbal cues;Requires tactile cues General Comments: poor awareness of deficits     General Comments       Exercises     Shoulder Instructions      Home Living Family/patient expects to be discharged to:: Private residence Living Arrangements: Alone Available Help at Discharge: Family;Available PRN/intermittently Type of Home: House Home Access: Level entry     Home Layout: One level     Bathroom Shower/Tub: Emergency planning/management officer: Handicapped height Bathroom Accessibility: Yes How Accessible: Accessible via walker;Accessible via wheelchair Home Equipment: Shower seat;Cane - single point;Grab bars - toilet;Grab bars - tub/shower;Rolling Walker (2 wheels)          Prior Functioning/Environment Prior Level of Function : Needs assist  Cognitive Assist : ADLs (cognitive) (IADL tasks)             ADLs Comments: Pt ives alone with her dog; drives and completes IADL tasks; states her step-sister helps as needed        OT Problem List: Decreased strength;Decreased range of motion;Decreased activity tolerance;Impaired balance (sitting and/or standing);Impaired vision/perception;Decreased coordination;Decreased cognition;Decreased safety awareness;Decreased knowledge of use of DME or AE;Decreased knowledge of precautions;Impaired sensation;Impaired UE functional use      OT Treatment/Interventions: Self-care/ADL training;Therapeutic exercise;Neuromuscular education;Energy conservation;DME and/or AE instruction;Therapeutic activities;Cognitive remediation/compensation;Visual/perceptual remediation/compensation;Patient/family education;Balance training    OT Goals(Current goals can be found in the care plan section) Acute Rehab OT Goals Patient Stated Goal: to see her dog OT Goal Formulation: Patient unable to participate in goal setting Time For Goal Achievement: 09/09/21 Potential to Achieve Goals: Good  OT Frequency: Min 2X/week   Barriers to D/C:            Co-evaluation PT/OT/SLP Co-Evaluation/Treatment: Yes Reason for Co-Treatment: Complexity of the patient's impairments (multi-system involvement);Necessary to address cognition/behavior during functional activity;For patient/therapist safety;To address functional/ADL transfers   OT goals addressed during session: ADL's and self-care      AM-PAC OT "6 Clicks" Daily Activity     Outcome Measure Help from another person eating  meals?: Total Help from another person taking care of personal grooming?: A Lot Help from another person toileting, which  includes using toliet, bedpan, or urinal?: A Lot Help from another person bathing (including washing, rinsing, drying)?: A Lot Help from another person to put on and taking off regular upper body clothing?: A Lot Help from another person to put on and taking off regular lower body clothing?: A Lot 6 Click Score: 11   End of Session Equipment Utilized During Treatment: Gait belt Nurse Communication: Mobility status  Activity Tolerance: Patient tolerated treatment well Patient left: in chair;with call bell/phone within reach;with chair alarm set  OT Visit Diagnosis: Unsteadiness on feet (R26.81);Other abnormalities of gait and mobility (R26.89);Muscle weakness (generalized) (M62.81);Other symptoms and signs involving the nervous system (R29.898);Other symptoms and signs involving cognitive function                Time: 3664-4034 OT Time Calculation (min): 27 min Charges:  OT General Charges $OT Visit: 1 Visit OT Evaluation $OT Eval Moderate Complexity: [place], OT/L   Acute OT Clinical Specialist Acute Rehabilitation Services Pager [phone] Office [phone]   [facility] 08/26/2021, 10:06 AM

## 2021-08-26 NOTE — Progress Notes (Signed)
Inpatient Rehab Admissions Coordinator Note:   Per PT/OT/ST patient was screened for CIR candidacy by Latoy Labriola Danford Bad, CCC-SLP.  Workup pending. Will rescreen tomorrow.     Gayland Curry, [place], [place] Admissions Coordinator [phone] 08/26/21 12:32 PM

## 2021-08-26 NOTE — Consult Note (Addendum)
Neurology Consultation  Reason for Consult: B. Dagal, MD.  Referring Physician: Confusion, Left sided weakness, ? Stroke.   CC: conusion, gait difficulty, left sided weakness, right gaze preference.   History is obtained from: chart.   HPI: TAVONNA WORTHINGTON is a 71 y.o. female with a PMHx of occular MHA, HTN with non adherence, COPD, HLD, NSCLD 2013 s/p lobectomy and chemotherapy, anxiety, depression, ETOH abuse, OA, Vit D and B12 deficiency. Patient presented to ED 16 hours ago, after family member felt she was some what confused on 08/24/21 and had some difficulty with her speech. The symptoms persisted on 08/25/20 and 911 brought her to hospital. In ED, patient c/o a bit of HA, no focal numbness or weakness. Suspicious for right sided neglect with vision changes on left eye. No fevers or recent sick, illness. Sister had noticed coughing with eating solid food and difficulty swallowing her pills.  Patient reports that the headache has been going on for 3 weeks,. The entire head hurts, sharp pain about 9/10, tylenol helps a little. She got off from jury duty 3 weeks ago, had a fall where she slammed her face into the ground. Headaches have been going on since.  Workup thus far showed :CTH negative for acute finding, WBCC 11.1K, INR 0.9, aPTT 26, Glucose 141, ammonia < 10, LDL 98, Vit B12 398, HbA1c 5.8, TSH 4.8.    Stroke in father. No PMHx of stroke.   Neurology asked to consult for encephalopathy and possible stroke.   ROS: A robust ROS was performed and is negative except as noted in the HPI.    Past Medical History:  Diagnosis Date   Anemia    duering cancer treatment    Anxiety    Arthritis    OA hands    Bilateral lower extremity pain    Blood transfusion without reported diagnosis    during chemo for lung cancer   Cataract    forming  but very small    Cholelithiasis    Colon polyps    adenomatous   High cholesterol    History of chemotherapy    for lung cancer-  completed 2013   Hx of radiation therapy 10/07/11 to 11/20/11   right lung   Hx of radiation therapy 01/07/2012- 01/21/12   cranial irradiation   Hypertension    Lung cancer ([place]) 09/19/11   Stage III currently in remission   Malignant melanoma of skin of canthus of right eye ([place]) 02/2017   Osteoporosis 04/2014   Dexa scan by Dr. Corinna Capra, T-score -2.7, next in 2 years, with at least 13 % major fracture in 10 years   Pitting edema    Bilateral lower extremities, duplex ultrasound 04/03/2015 normal, no DVT   Family History  Problem Relation Age of Onset   Bladder Cancer Father    Stroke Father    Colon polyps Father    Atrial fibrillation Mother    Heart disease Mother        CHF   Clotting disorder Mother        warfarin   Colon polyps Mother    Colon cancer Maternal Aunt 70   Drug abuse Son    Breast cancer Neg Hx    Esophageal cancer Neg Hx    Stomach cancer Neg Hx    Rectal cancer Neg Hx    Social History:   reports that she quit smoking about 9 years ago. Her smoking use included cigarettes. She has never used smokeless  tobacco. She reports current alcohol use. She reports that she does not use drugs.  Medications  Current Facility-Administered Medications:     stroke: mapping our early stages of recovery book, , Does not apply, Once, Wynetta Fines T, MD   [COMPLETED] sodium chloride 0.9 % bolus 500 mL, 500 mL, Intravenous, Once, Stopped at 08/25/21 1704 **FOLLOWED BY** 0.9 %  sodium chloride infusion, 100 mL/hr, Intravenous, Continuous, Dorie Rank, MD, Stopped at 08/25/21 1838   0.9 %  sodium chloride infusion, , Intravenous, Continuous, Wynetta Fines T, MD, Last Rate: 100 mL/hr at 08/26/21 0607, New Bag at 08/26/21 0607   acetaminophen (TYLENOL) tablet 650 mg, 650 mg, Oral, Q4H PRN **OR** acetaminophen (TYLENOL) 160 MG/5ML solution 650 mg, 650 mg, Per Tube, Q4H PRN **OR** acetaminophen (TYLENOL) suppository 650 mg, 650 mg, Rectal, Q4H PRN, Wynetta Fines T, MD   acetaminophen (TYLENOL)  tablet 650 mg, 650 mg, Oral, Q6H PRN, Lequita Halt, MD   aspirin EC tablet 81 mg, 81 mg, Oral, Daily, Wynetta Fines T, MD   cholecalciferol (VITAMIN D3) tablet 4,000 Units, 4,000 Units, Oral, Daily, Heloise Purpura, RPH   citalopram (CELEXA) tablet 20 mg, 20 mg, Oral, Daily, Wynetta Fines T, MD   diphenhydrAMINE (BENADRYL) injection 12.5 mg, 12.5 mg, Intravenous, Once, Wynetta Fines T, MD   enoxaparin (LOVENOX) injection 40 mg, 40 mg, Subcutaneous, Q24H, Wynetta Fines T, MD, 40 mg at 40/97/35 3299   folic acid (FOLVITE) tablet 1 mg, 1 mg, Oral, Daily, Wynetta Fines T, MD   labetalol (NORMODYNE) injection 10 mg, 10 mg, Intravenous, Q6H PRN, Wynetta Fines T, MD   LORazepam (ATIVAN) tablet 1-4 mg, 1-4 mg, Oral, Q1H PRN **OR** LORazepam (ATIVAN) injection 1-4 mg, 1-4 mg, Intravenous, Q1H PRN, Wynetta Fines T, MD, 2 mg at 08/26/21 2426   metoprolol tartrate (LOPRESSOR) injection 5 mg, 5 mg, Intravenous, Q4H PRN, Howerter, Justin B, DO   multivitamin with minerals tablet 1 tablet, 1 tablet, Oral, Daily, Zhang, Pearletha Forge T, MD   senna-docusate (Senokot-S) tablet 1 tablet, 1 tablet, Oral, QHS PRN, Wynetta Fines T, MD   thiamine tablet 100 mg, 100 mg, Oral, Daily **OR** thiamine (B-1) injection 100 mg, 100 mg, Intravenous, Daily, [place], Ping T, MD, 100 mg at 08/25/21 2044   vitamin B-12 (CYANOCOBALAMIN) tablet 2,000 mcg, 2,000 mcg, Oral, Daily, Heloise Purpura, [facility]   Exam: Current vital signs: BP 105/75 (BP Location: Left Arm)    Pulse (!) 103    Temp 98.5 F (36.9 C) (Axillary)    Resp 18    SpO2 98%  Vital signs in last 24 hours: Temp:  [98.5 F (36.9 C)-100 F (37.8 C)] 98.5 F (36.9 C) (01/02 0749) Pulse Rate:  [103-121] 103 (01/02 0749) Resp:  [13-23] 18 (01/02 0749) BP: (105-155)/(61-139) 105/75 (01/02 0749) SpO2:  [97 %-100 %] 98 % (01/02 0749)  PE: GENERAL:sleepy but fairly well appearing. Requires tactile stimulate to arouse and sometimes during exam to keep her awake. NAD.  HEENT: normocephalic  and atraumatic. Dry mucus membranes.  LUNGS - Normal respiratory effort.  CV - RRR on tele. ABDOMEN - Soft, nontender. Ext: warm, well perfused. Psych: affect flat.    NEURO:  Mental Status: Oriented to self, age, place, year. Disoriented to city, state, day or date.  Speech/Language: speech is without dysarthria or aphasia.  Able to name watch and that it tells time. Unable to name pen, states, I don't know what that is. Will not answer which finger NP is holdng up. He  is able to repeat and fluency displayed by tellling NP about her dog. Comprehension is slow as she has to repeat commands, before she performs then. She also is confused with some commands, like FNF-she uses her right hand to grab left finger.  Cranial Nerves:  II: PERRL  5 mm/brisk. Left homonymous hemanopia noted.   III, IV, VI: EOMI-will not gaze all the way to the right. Forced gaze does not change. Eyes are conjugate. Lid elevation symmetric and full.  V: sensation is intact and symmetrical to face.  VII: Smile is symmetrical.  VIII:hearing intact to voice. IX, X: palate elevation is symmetric. Phonation normal.  XI: normal sternocleidomastoid and trapezius muscle strength. QIH:KVQQVZ is symmetrical without fasciculations.   Motor:  RUE: grips  4/5       triceps 4/5     biceps  4/5      LUE: grips  3/5      triceps  3/5      biceps   3/5 RLE:  knee  4/5   thigh  4/5    plantar flexion  4+/5   dorsiflexion  4+/5 LLE:  knee  0/5   thigh  0/5     plantar flexion   3+/5   dorsiflexion   3/5 She can hold up RUE without drift. She has effort against gravity on left, but hits bed. She can lift her RLE about 6 inches off bed. Can not lift her LLE, but can dorsiflex/plantar flex against gravity and can wiggle her toes.  Tone is normal. Bulk is normal.  Sensation- Intact to light touch bilaterally in all four extremities.   Coordination: FTN intact on tight. Unable to perform HKS. DTRs RUE:  biceps      brachioradialis 1      triceps RLE:  patella  0    tibial LUE:  biceps     brachioradialis 1    triceps LLE: patella 0     tibial Gait- deferred.  NIHSS:  1a Level of Consciousness:1  1b LOC Questions: 1 1c LOC Commands: 1 2 Best Gaze: 2 3 Visual: 2 4 Facial Palsy: 0 5a Motor Arm - left:  5b Motor Arm - Right: 0 6a Motor Leg - Left: 3 6b Motor Leg - Right: 2 7 Limb Ataxia: 2 8 Sensory: 0  9 Best Language: 1 10 Dysarthria: 1 11 Extinction and Inattention: 0 TOTAL:  16  Labs I have reviewed labs in epic and the results pertinent to this consultation are: as per HPI.   CBC    Component Value Date/Time   WBC 11.1 (H) 08/25/2021 1610   RBC 4.57 08/25/2021 1610   HGB 16.0 (H) 08/25/2021 1620   HGB 12.9 01/22/2021 1152   HGB 12.7 02/05/2017 0817   HCT 47.0 (H) 08/25/2021 1620   HCT 38.8 02/05/2017 0817   PLT 234 08/25/2021 1610   PLT 198 01/22/2021 1152   PLT 206 02/05/2017 0817   MCV 96.5 08/25/2021 1610   MCV 94.6 02/05/2017 0817   MCH 33.0 08/25/2021 1610   MCHC 34.2 08/25/2021 1610   RDW 11.7 08/25/2021 1610   RDW 12.5 02/05/2017 0817   LYMPHSABS 0.7 08/25/2021 1610   LYMPHSABS 0.6 (L) 02/05/2017 0817   MONOABS 0.8 08/25/2021 1610   MONOABS 0.5 02/05/2017 0817   EOSABS 0.0 08/25/2021 1610   EOSABS 0.2 02/05/2017 0817   BASOSABS 0.0 08/25/2021 1610   BASOSABS 0.0 02/05/2017 0817    CMP     Component Value  Date/Time   NA 134 (L) 08/25/2021 1620   NA 143 02/05/2017 0817   K 3.8 08/25/2021 1620   K 4.3 02/05/2017 0817   CL 101 08/25/2021 1620   CL 104 12/23/2012 1338   CO2 23 08/25/2021 1610   CO2 25 02/05/2017 0817   GLUCOSE 128 (H) 08/25/2021 1620   GLUCOSE 96 02/05/2017 0817   GLUCOSE 85 12/23/2012 1338   BUN 12 08/25/2021 1620   BUN 16.0 02/05/2017 0817   CREATININE 0.90 08/25/2021 1620   CREATININE 0.91 01/22/2021 1152   CREATININE 1.0 02/05/2017 0817   CALCIUM 9.4 08/25/2021 1610   CALCIUM 9.5 02/05/2017 0817   PROT RESULTS UNAVAILABLE DUE TO INTERFERING SUBSTANCE  08/25/2021 1610   PROT 6.9 02/05/2017 0817   ALBUMIN 4.2 08/25/2021 1610   ALBUMIN 3.8 02/05/2017 0817   AST 37 08/25/2021 1610   AST 26 01/22/2021 1152   AST 21 02/05/2017 0817   ALT 32 08/25/2021 1610   ALT 15 01/22/2021 1152   ALT 12 02/05/2017 0817   ALKPHOS 64 08/25/2021 1610   ALKPHOS 48 02/05/2017 0817   BILITOT 1.0 08/25/2021 1610   BILITOT 0.3 01/22/2021 1152   BILITOT 0.31 02/05/2017 0817   GFRNONAA 55 (L) 08/25/2021 1610   GFRNONAA >60 01/22/2021 1152   GFRAA 59 (L) 02/03/2020 0731    Lipid Panel     Component Value Date/Time   CHOL 193 08/26/2021 0221   CHOL 159 11/20/2020 1001   TRIG 138 08/26/2021 0221   HDL 67 08/26/2021 0221   HDL 86 11/20/2020 1001   CHOLHDL 2.9 08/26/2021 0221   VLDL 28 08/26/2021 0221   LDLCALC 98 08/26/2021 0221   LDLCALC 49 11/20/2020 1001   LDLDIRECT 282.7 12/03/2012 1138    Imaging MD reviewed the images obtained.  CT head No acute abnormality.  MRI brain 1. No acute or reversible finding. No evidence of metastatic disease. 2. Cerebral atrophy.  Vascular carotid US Ordered.  CTA head Ordered.   Assessment: 71 yo female with stroke risk factors of HLD, HTN, non adherence, cranial irradiation, occular MHAs who presented with fall 3 weeks ago and around the same time started having holocephalic headaches + confusion, gait disturbance + Left sided weakness and left sided neglect. MRI negative for stroke.  Etiology somewhat evasive at this time. Could be a concussion from fall? Other differential include low perfusion to the R hemispheric, focal seizures, complicated migraine.  Recommendations/Plan:  -CTA head and neck along with CT perfusion.  - routine EEG. - Headache cocktail with Magnesium 2g IV once along with Compazine, Benadryl, IV fluids. - Vitamin B12 supplementation. - Thiamine supplementation. - Await Vit D-hydoxy 25, and Vit E.  - Avoid medications on the Beer's List for the elderly.  - Delirium  precautions.  - Fall precautions.  - PT/OT/ST.    Pt seen by Clance Boll, NP/Neuro and later by MD. Note/plan to be edited by MD as needed.  Pager: [phone]  NEUROHOSPITALIST ADDENDUM Performed a face to face diagnostic evaluation.   I have reviewed the contents of history and physical exam as documented by PA/ARNP/Resident and agree with above documentation.  I have discussed and formulated the above plan as documented. Edits to the note have been made as needed.  Impression/Key exam findings/Plan: 71 y/o F with fall where she fell face first into a road way about 3 weeks ago. Around the same time, she started having sharp holocephalic headaches that is unrelenting along with L hemianosia, Left extinction  and mild LUE and LLE weakness. Workup with MRI Brain with and without contrast is negative for an acute stroke.  Etiology somewhat evasive at this time. Could be a concussion from fall? Other differential include low perfusion to the R hemispheric, focal seizures, complicated migraine.  Will do a headache cocktail(Magnesium, Benadryl, Compazine and Decadron) along with cEEG, vessel imaging along with CT perfusion.  Donnetta Simpers, MD [facility] [phone]   If 7pm to 7am, please call on call as listed on AMION.

## 2021-08-27 ENCOUNTER — Inpatient Hospital Stay (HOSPITAL_COMMUNITY): Payer: Medicare Other

## 2021-08-27 DIAGNOSIS — I10 Essential (primary) hypertension: Secondary | ICD-10-CM | POA: Diagnosis not present

## 2021-08-27 LAB — CBC WITH DIFFERENTIAL/PLATELET
Abs Immature Granulocytes: 0.01 10*3/uL (ref 0.00–0.07)
Basophils Absolute: 0 10*3/uL (ref 0.0–0.1)
Basophils Relative: 1 %
Eosinophils Absolute: 0.1 10*3/uL (ref 0.0–0.5)
Eosinophils Relative: 2 %
HCT: 35.1 % — ABNORMAL LOW (ref 36.0–46.0)
Hemoglobin: 11.7 g/dL — ABNORMAL LOW (ref 12.0–15.0)
Immature Granulocytes: 0 %
Lymphocytes Relative: 11 %
Lymphs Abs: 0.6 10*3/uL — ABNORMAL LOW (ref 0.7–4.0)
MCH: 32.3 pg (ref 26.0–34.0)
MCHC: 33.3 g/dL (ref 30.0–36.0)
MCV: 97 fL (ref 80.0–100.0)
Monocytes Absolute: 0.6 10*3/uL (ref 0.1–1.0)
Monocytes Relative: 10 %
Neutro Abs: 4.6 10*3/uL (ref 1.7–7.7)
Neutrophils Relative %: 76 %
Platelets: 159 10*3/uL (ref 150–400)
RBC: 3.62 MIL/uL — ABNORMAL LOW (ref 3.87–5.11)
RDW: 11.7 % (ref 11.5–15.5)
WBC: 5.9 10*3/uL (ref 4.0–10.5)
nRBC: 0 % (ref 0.0–0.2)

## 2021-08-27 LAB — ECHOCARDIOGRAM COMPLETE
Area-P 1/2: 6.37 cm2
S' Lateral: 2.4 cm

## 2021-08-27 LAB — BASIC METABOLIC PANEL
Anion gap: 7 (ref 5–15)
BUN: 10 mg/dL (ref 8–23)
CO2: 23 mmol/L (ref 22–32)
Calcium: 8.4 mg/dL — ABNORMAL LOW (ref 8.9–10.3)
Chloride: 106 mmol/L (ref 98–111)
Creatinine, Ser: 0.92 mg/dL (ref 0.44–1.00)
GFR, Estimated: >60 mL/min (ref >60)
Glucose, Bld: 111 mg/dL — ABNORMAL HIGH (ref 70–99)
Potassium: 3.5 mmol/L (ref 3.5–5.1)
Sodium: 136 mmol/L (ref 135–145)

## 2021-08-27 MED ORDER — PROCHLORPERAZINE EDISYLATE 10 MG/2ML IJ SOLN
10.0000 mg | Freq: Once | INTRAMUSCULAR | Status: AC
  Administered 2021-08-27: 10 mg via INTRAVENOUS
  Filled 2021-08-27: qty 2

## 2021-08-27 MED ORDER — MAGNESIUM SULFATE IN D5W 1-5 GM/100ML-% IV SOLN
1.0000 g | Freq: Once | INTRAVENOUS | Status: DC
  Filled 2021-08-27: qty 100

## 2021-08-27 MED ORDER — LORAZEPAM 2 MG/ML IJ SOLN
2.0000 mg | Freq: Once | INTRAMUSCULAR | Status: AC
  Administered 2021-08-27: 2 mg via INTRAMUSCULAR

## 2021-08-27 MED ORDER — DEXAMETHASONE SODIUM PHOSPHATE 10 MG/ML IJ SOLN: 8.0000 mg | Freq: Once | INTRAMUSCULAR | Status: DC

## 2021-08-27 MED ORDER — MAGNESIUM SULFATE 2 GM/50ML IV SOLN
2.0000 g | Freq: Once | INTRAVENOUS | Status: AC
Start: 2021-08-27 — End: 2021-08-27
  Administered 2021-08-27: 2 g via INTRAVENOUS
  Filled 2021-08-27: qty 50

## 2021-08-27 MED ORDER — DEXAMETHASONE SODIUM PHOSPHATE 10 MG/ML IJ SOLN
8.0000 mg | Freq: Once | INTRAMUSCULAR | Status: AC
  Administered 2021-08-27: 8 mg via INTRAVENOUS
  Filled 2021-08-27: qty 1

## 2021-08-27 MED ORDER — IOHEXOL 350 MG/ML SOLN
100.0000 mL | Freq: Once | INTRAVENOUS | Status: AC | PRN
  Administered 2021-08-27: 100 mL via INTRAVENOUS

## 2021-08-27 MED ORDER — HALOPERIDOL LACTATE 5 MG/ML IJ SOLN: 5.0000 mg | Freq: Once | INTRAMUSCULAR | Status: DC

## 2021-08-27 MED ORDER — DIPHENHYDRAMINE HCL 50 MG/ML IJ SOLN
25.0000 mg | Freq: Once | INTRAMUSCULAR | Status: AC
  Administered 2021-08-27: 25 mg via INTRAVENOUS
  Filled 2021-08-27: qty 1

## 2021-08-27 MED ORDER — PROCHLORPERAZINE EDISYLATE 10 MG/2ML IJ SOLN: 10.0000 mg | Freq: Once | INTRAMUSCULAR | Status: DC

## 2021-08-27 MED ORDER — OLANZAPINE 10 MG IM SOLR
5.0000 mg | Freq: Once | INTRAMUSCULAR | Status: DC
  Filled 2021-08-27: qty 10

## 2021-08-27 MED ORDER — PERFLUTREN LIPID MICROSPHERE
1.0000 mL | INTRAVENOUS | Status: AC | PRN
  Administered 2021-08-27: 2 mL via INTRAVENOUS
  Filled 2021-08-27: qty 10

## 2021-08-27 NOTE — Progress Notes (Signed)
PT Cancellation Note  Patient Details Name: [name] [name] MRN: [id number] DOB: 05-13-1951   Cancelled Treatment:    Reason Eval/Treat Not Completed: Patient at procedure or test/unavailable. Pt being set up for EEG. PT to re-attempt as time allows.   Lorriane Shire 08/27/2021, 10:11 AM  Lorrin Goodell, PT  Office # [phone] Pager [phone]

## 2021-08-27 NOTE — Progress Notes (Signed)
EEG complete - results pending 

## 2021-08-27 NOTE — Progress Notes (Signed)
Pt initially became agitated after the IV bandage was taken off. She pulled her IV out probably by accident. From that point one pt was verbally and physically aggressive toward staff. She eventually was given prn Ativan following CIWA protocol.

## 2021-08-27 NOTE — Progress Notes (Signed)
°  Echocardiogram 2D Echocardiogramwith contrast has been performed.  [name] [name] F 08/27/2021, 2:44 PM

## 2021-08-27 NOTE — Procedures (Addendum)
Patient Name: [name] [name]  MRN: [id number]  Epilepsy Attending: Lora Havens  Referring Physician/Provider: Dr Donnetta Simpers Date: 08/27/2021 Duration: 21.38 mins  Patient history: 71 year old female with headache, left hemianopsia left extinction and left hemiparesis.  EEG to evaluate for seizure.  Level of alertness: Awake  AEDs during EEG study: None  Technical aspects: This EEG study was done with scalp electrodes positioned according to the 10-20 International system of electrode placement. Electrical activity was acquired at a sampling rate of 500Hz  and reviewed with a high frequency filter of 70Hz  and a low frequency filter of 1Hz . EEG data were recorded continuously and digitally stored.   Description: The posterior dominant rhythm consists of 8 Hz activity of moderate voltage (25-35 uV) seen predominantly in posterior head regions, symmetric and reactive to eye opening and eye closing.  Drowsiness was characterized by attenuation of posterior dominant rhythm.  EEG showed continuous low amplitude 3 to 6 Hz theta -delta slowing in her right frontotemporal region.  Hyperventilation and photic stimulation were not performed.     ABNORMALITY - Continuous slow, right frontotemporal region  IMPRESSION: This study is suggestive of cortical dysfunction arising from  right frontotemporal region, nonspecific etiology but could be secondary to underlying structural abnormality, postictal state. No seizures or definite epileptiform discharges were seen throughout the recording.   Peniel Biel Barbra Sarks

## 2021-08-27 NOTE — Progress Notes (Signed)
[facility] night cross cover note:  I was notified by RN that the patient is confused, agitated, pulling at her lines, including attempting to remove current EEG leads, and overall interfering with medical care, with these actions refractory to several attempts at verbal redirection.   As the patient is currently undergoing EEG monitoring, will refrain from pharmacologic intervention via benzodiazepines.  I subsequently reviewed patient's most recent EKG revealing QTC of 469 ms, before placing one-time order for Haldol.  Patient's sister subsequently requests use of an alternate antipsychotic medication instead of the current order for Haldol.  In acquiescing with her request, I have discontinued order for Haldol, and instead placed an order for a one-time dose of IM Zyprexa.     Babs Bertin, DO Hospitalist

## 2021-08-27 NOTE — Progress Notes (Signed)
Pt trying to get OOB, anxious, and a high falls risk. Pt's sister called at # provided, her sister is not able to sit w/ pt and is requesting pt have something to calm her. Dr. Velia Meyer informed.

## 2021-08-27 NOTE — Progress Notes (Signed)
PROGRESS NOTE  [name]  DOB: 1950-10-01  PCP: Colon Branch, MD LPF:[id number]  DOA: 08/25/2021  LOS: 2 days  Hospital Day: 3  Chief Complaint  Patient presents with   Altered Mental Status   Brief narrative: [name] [name] is a 71 y.o. female with PMH significant for HTN, HLD, ocular migraine, non-small cell lung cancer in 2013 status post lobectomy, chemo and prophylactic cranial irradiation, noncompliance to medications who lives by self and gets regular stops by her sister. Patient presented to the ED on 08/25/2021 with altered mental status, and new left-sided weakness causing gait impairment.  Patient has chronic dysphagia which he states is because of ' big uvula'. Patient also reported that she has ongoing headache for 3 weeks after a fall slamming her face into the ground.  In the ED, patient had a temperature of 100, heart rate elevated persistently to over 110, blood pressure in 150s, breathing on room air CT scan of head did not show any acute intracranial abnormality MRI brain showed cerebral atrophy but did not show any acute reversible finding, no evidence of metastatic disease. CTA head and neck did not show any large vessel occlusion or hemodynamically significant stenosis. Admitted to hospitalist service.. Neurology consulted.  Subjective: Patient was seen and examined this morning.  Sitting up in chair.  Not in distress.  Alert, awake, oriented to place and person.  Not restless or agitated.  No family at bedside. Patient still complains of headache.  Assessment/Plan: Fall leading to neurological symptoms -Patient reportedly had a fall 3 weeks ago slamming her face to the ground.  She started having headache, intermittent confusion, gait disturbance since then.  In the ED, she was noted to have left-sided weakness and also a concern of left-sided neglect. -Imagings negative for stroke or any other injury related to fall. -Neurology consult  appreciated. -Unclear etiology of her symptoms at this time.  Could be related to concussion. -Pending EEG today. -TSH slightly elevated, vitamin B12 normal, ammonia level normal, RPR negative. -Headache cocktail started by neurology. -Continue to monitor mental status change  Acute metabolic encephalopathy -Patient has intermittent confusion.  She is oriented to place and person but not to time.  Imagings and labs unremarkable.  Pending EEG today. -Continue reorientation efforts.   Chronic dysphagia -Patient states that she has chronic dysphagia.  She was told in the past by her dentist that she has a large uvula.  Patient denies any history of esophageal stretching. -Seen by speech therapy.  Regular diet started.  Hypertension, sinus tachycardia -Noncompliant to medications at home.   -I started her on oral Coreg to address both tachycardia and hypertension.  Continue to monitor.  May need to increase the dose if needed. -IV hydralazine as needed.    HLD -HDL 67, LDL 98.   History of non-small cell lung CA -non-small cell lung cancer in 2013 status post lobectomy, chemo and prophylactic cranial irradiation -Annual chest CT and follow-up with oncology.  Last visit was June 2022, CT chest without contrast due to contrast shortage.  Vitamin B12 deficiency -Continue vitamin B12 supplement.   Mobility: PT evaluation obtained.  CIR recommended. Living condition: Lives at home alone Goals of care:   Code Status: Full Code  Nutritional status: There is no height or weight on file to calculate BMI.      Diet:  Diet Order             Diet regular Room service appropriate? No; Fluid consistency:  Thin  Diet effective now                  DVT prophylaxis:  enoxaparin (LOVENOX) injection 40 mg Start: 08/25/21 1830   Antimicrobials: None Fluid: Not on IV fluid Consultants: Neurology Family Communication: None at bedside  Status is: Inpatient  Continue in-hospital care  because:, EEG pending, CIR pending Level of care: Telemetry Medical   Dispo: The patient is from: Home              Anticipated d/c is to: CIR              Patient currently is not medically stable to d/c.   Difficult to place patient No     Infusions:   sodium chloride Stopped (08/25/21 1838)   thiamine injection 500 mg (08/27/21 8119)   Followed by   Derrill Memo ON 08/28/2021] thiamine injection      Scheduled Meds:   stroke: mapping our early stages of recovery book   Does not apply Once   aspirin EC  81 mg Oral Daily   carvedilol  3.125 mg Oral BID WC   cholecalciferol  4,000 Units Oral Daily   citalopram  20 mg Oral Daily   diphenhydrAMINE  12.5 mg Intravenous Once   enoxaparin (LOVENOX) injection  40 mg Subcutaneous [id number]   folic acid  1 mg Oral Daily   multivitamin with minerals  1 tablet Oral Daily   [START ON 09/03/2021] thiamine injection  100 mg Intravenous Daily   vitamin B-12  2,000 mcg Oral Daily    PRN meds: acetaminophen **OR** acetaminophen (TYLENOL) oral liquid 160 mg/5 mL **OR** acetaminophen, acetaminophen, butalbital-acetaminophen-caffeine, hydrALAZINE, labetalol, LORazepam **OR** LORazepam, metoprolol tartrate, senna-docusate   Antimicrobials: Anti-infectives (From admission, onward)    None       Objective: Vitals:   08/27/21 0338 08/27/21 0941  BP: 133/74 (!) 142/84  Pulse: (!) 110 (!) 112  Resp: 20 20  Temp: 99.3 F (37.4 C) 98.3 F (36.8 C)  SpO2: 98% 97%    Intake/Output Summary (Last 24 hours) at 08/27/2021 1025 Last data filed at 08/27/2021 0531 Gross per 24 hour  Intake 480 ml  Output 850 ml  Net -370 ml   There were no vitals filed for this visit. Weight change:  There is no height or weight on file to calculate BMI.   Physical Exam: General exam: Pleasant, elderly Caucasian female.  Not in physical distress Skin: No rashes, lesions or ulcers. HEENT: Atraumatic, normocephalic, no obvious bleeding Lungs: Clear to auscultation  bilaterally CVS: Regular rate and rhythm, no murmur GI/Abd soft, nontender, nondistended, bowel sound present CNS: Alert, awake, oriented to place and person, not to time.  Not restless or agitated. Psychiatry: Mood appropriate Extremities: No pedal edema, no calf tenderness  Data Review: I have personally reviewed the laboratory data and studies available.  F/u labs ordered Unresulted Labs (From admission, onward)     Start     Ordered   08/27/21 0500  Vitamin E  Tomorrow morning,   R        08/26/21 0742   08/25/21 1613  Urine rapid drug screen (hosp performed)  ONCE - STAT,   STAT        08/25/21 1613   08/25/21 1613  Urinalysis, Routine w reflex microscopic  ONCE - STAT,   STAT        08/25/21 1613            Signed, Terrilee Croak, MD  [facility] 08/27/2021

## 2021-08-27 NOTE — Progress Notes (Signed)
Neurology Plan of care note:   Went to see patient and she was having EEG placed.   NP never got back to see patient today. Per RN, she will say she still has HA, but is back to sleep before medicine can be given.    EEG: This study is suggestive of cortical dysfunction arising from  right frontotemporal region, nonspecific etiology but could be secondary to underlying structural abnormality, postictal state. No seizures or definite epileptiform discharges were seen throughout the recording.   O: Current vital signs: BP (!) 142/84 (BP Location: Left Arm)    Pulse (!) 112 Comment: RN Notified   Temp 98.3 F (36.8 C) (Oral)    Resp 20    SpO2 97%  ViThis study is suggestive of cortical dysfunction arising from  right frontotemporal region, nonspecific etiology but could be secondary to underlying structural abnormality, postictal state. No seizures or definite epileptiform discharges were seen throughout the recording. Vital signs in last 24 hours: Temp:  [97.8 F (36.6 C)-99.3 F (37.4 C)] 98.3 F (36.8 C) (01/03 0941) Pulse Rate:  [102-112] 112 (01/03 0941) Resp:  [18-20] 20 (01/03 0941) BP: (114-142)/(74-98) 142/84 (01/03 0941) SpO2:  [97 %-100 %] 97 % (01/03 0941)  GENERAL: Fairly well appearing. Awake, alert in NAD. HEENT: Normocephalic and atraumatic. EEG leads being placed.  LUNGS: Normal respiratory effort.   NEURO:  Mental Status: Alert, talking. Sensical.   Speech/Language: speech is without aphasia or dysarthria.    Medications  Current Facility-Administered Medications:     stroke: mapping our early stages of recovery book, , Does not apply, Once, Wynetta Fines T, MD   [COMPLETED] sodium chloride 0.9 % bolus 500 mL, 500 mL, Intravenous, Once, Stopped at 08/25/21 1704 **FOLLOWED BY** 0.9 %  sodium chloride infusion, 100 mL/hr, Intravenous, Continuous, Dorie Rank, MD, Stopped at 08/25/21 1838   acetaminophen (TYLENOL) tablet 650 mg, 650 mg, Oral, Q4H PRN **OR** acetaminophen  (TYLENOL) 160 MG/5ML solution 650 mg, 650 mg, Per Tube, Q4H PRN, 650 mg at 08/26/21 1136 **OR** acetaminophen (TYLENOL) suppository 650 mg, 650 mg, Rectal, Q4H PRN, Wynetta Fines T, MD   acetaminophen (TYLENOL) tablet 650 mg, 650 mg, Oral, Q6H PRN, Wynetta Fines T, MD   aspirin EC tablet 81 mg, 81 mg, Oral, Daily, Roosevelt Locks, Ping T, MD, 81 mg at 08/27/21 0854   butalbital-acetaminophen-caffeine (FIORICET) 50-325-40 MG per tablet 1 tablet, 1 tablet, Oral, Q6H PRN, Dahal, Binaya, MD   carvedilol (COREG) tablet 3.125 mg, 3.125 mg, Oral, BID WC, Dahal, Binaya, MD, 3.125 mg at 08/27/21 0854   cholecalciferol (VITAMIN D3) tablet 4,000 Units, 4,000 Units, Oral, Daily, Heloise Purpura, RPH, 4,000 Units at 08/27/21 0854   citalopram (CELEXA) tablet 20 mg, 20 mg, Oral, Daily, [place] Locks, Ping T, MD, 20 mg at 08/27/21 0854   dexamethasone (DECADRON) injection 8 mg, 8 mg, Intravenous, Once, Kirby-Graham, Karsten Fells, NP   diphenhydrAMINE (BENADRYL) injection 12.5 mg, 12.5 mg, Intravenous, Once, Roosevelt Locks, Ping T, MD   enoxaparin (LOVENOX) injection 40 mg, 40 mg, Subcutaneous, Q24H, Roosevelt Locks, Ping T, MD, 40 mg at 82/99/37 1696   folic acid (FOLVITE) tablet 1 mg, 1 mg, Oral, Daily, Wynetta Fines T, MD, 1 mg at 08/27/21 7893   hydrALAZINE (APRESOLINE) injection 10 mg, 10 mg, Intravenous, Q6H PRN, Dahal, Binaya, MD   labetalol (NORMODYNE) injection 10 mg, 10 mg, Intravenous, Q6H PRN, Wynetta Fines T, MD   LORazepam (ATIVAN) tablet 1-4 mg, 1-4 mg, Oral, Q1H PRN **OR** LORazepam (ATIVAN) injection 1-4 mg, 1-4  mg, Intravenous, Q1H PRN, Wynetta Fines T, MD, 2 mg at 08/26/21 3790   magnesium sulfate IVPB 1 g 100 mL, 1 g, Intravenous, Once, Kirby-Graham, Karsten Fells, NP   metoprolol tartrate (LOPRESSOR) injection 5 mg, 5 mg, Intravenous, Q4H PRN, Howerter, Justin B, DO   multivitamin with minerals tablet 1 tablet, 1 tablet, Oral, Daily, Wynetta Fines T, MD, 1 tablet at 08/27/21 2409   prochlorperazine (COMPAZINE) injection 10 mg, 10 mg, Intravenous,  Once, Kirby-Graham, Karsten Fells, NP   senna-docusate (Senokot-S) tablet 1 tablet, 1 tablet, Oral, QHS PRN, Lequita Halt, MD   thiamine 500mg  in normal saline (74ml) IVPB, 500 mg, Intravenous, Q8H, Last Rate: 100 mL/hr at 08/27/21 0623, 500 mg at 08/27/21 7353 **FOLLOWED BY** [START ON 08/28/2021] thiamine (B-1) 250 mg in sodium chloride 0.9 % 50 mL IVPB, 250 mg, Intravenous, Daily **FOLLOWED BY** [START ON 09/03/2021] thiamine (B-1) injection 100 mg, 100 mg, Intravenous, Daily, Kirby-Graham, Karsten Fells, NP   vitamin B-12 (CYANOCOBALAMIN) tablet 2,000 mcg, 2,000 mcg, Oral, Daily, Heloise Purpura, RPH, 2,000 mcg at 08/27/21 2992  New Imaging  CTA head and neck and CT perfusion.  Anterior circulation: Intracranial internal carotid arteries are patent with mild plaque and atherosclerotic irregularity. 1 mm inferiorly directed outpouching from the distal supraclinoid right ICA. Anterior and middle cerebral arteries are patent.   Posterior circulation: Intracranial vertebral arteries are patent. Basilar artery is patent. Major cerebellar artery origins are patent. Posterior cerebral arteries are patent.  Venous sinuses: As permitted by contrast timing, patent.  Review of the MIP images confirms the above findings  CT Brain Perfusion Findings:  CBF (<30%) Volume: 60mL  Perfusion (Tmax>6.0s) volume: 73mL  Mismatch Volume: 18mL  Infarction Location: None  IMPRESSION: No acute intracranial abnormality.   No large vessel occlusion or hemodynamically significant stenosis.   1 mm aneurysm or infundibulum of the distal supraclinoid right ICA.   Perfusion imaging is partially degraded by artifact but is unremarkable.  Assessment:  71 yo female with stroke risk factors of HLD, HTN, non adherence, cranial irradiation, occular MHAs who presented with fall 3 weeks ago and around the same time started having holocephalic headaches + confusion, gait disturbance + Left sided weakness and left sided neglect. MRI  negative for stroke. CTA H/N/P negative for LVO or mismatch.   We are still wondering what is the etiology of her presentation however with the late read on the EEG, it would appear she has slowing on EEG, but no seizure activity.     Recommendations/Plan:  -We will f/up EEG overnight.  -See patient tomorrow and follow imaging.    Pt seen by Clance Boll, MSN, APN-BC/Nurse Practitioner/Neuro

## 2021-08-27 NOTE — Progress Notes (Signed)
Inpatient Rehab Admissions Coordinator:   Per therapy recommendations patient was screened for CIR candidacy by Clemens Catholic, MS, CCC-SLP. At this time, Pt. Appears to be a a potential candidate for CIR. I will place   order for rehab consult per protocol for full assessment. Please contact me any with questions.  Clemens Catholic, [place], [place] Admissions Coordinator  [phone] ([place]) [phone] (office)

## 2021-08-27 NOTE — Progress Notes (Signed)
Started cEEG study.  Notified [facility] monitoring.  Tested patient event button. 

## 2021-08-28 DIAGNOSIS — R4182 Altered mental status, unspecified: Secondary | ICD-10-CM

## 2021-08-28 DIAGNOSIS — H5340 Unspecified visual field defects: Secondary | ICD-10-CM

## 2021-08-28 LAB — GLUCOSE, CAPILLARY: Glucose-Capillary: 105 mg/dL — ABNORMAL HIGH (ref 70–99)

## 2021-08-28 MED ORDER — CARVEDILOL 6.25 MG PO TABS
6.2500 mg | ORAL_TABLET | Freq: Two times a day (BID) | ORAL | Status: DC
  Administered 2021-08-28 – 2021-08-30 (×4): 6.25 mg via ORAL
  Filled 2021-08-28 (×4): qty 1

## 2021-08-28 MED ORDER — LORAZEPAM 2 MG/ML IJ SOLN
1.0000 mg | Freq: Four times a day (QID) | INTRAMUSCULAR | Status: DC | PRN
  Administered 2021-08-29 (×2): 1 mg via INTRAVENOUS
  Filled 2021-08-28 (×2): qty 1

## 2021-08-28 NOTE — Care Management Important Message (Signed)
Important Message  Patient Details  Name: [name] [name] MRN: [id number] Date of Birth: 1951/05/18   Medicare Important Message Given:  Yes     Orbie Pyo 08/28/2021, 2:29 PM

## 2021-08-28 NOTE — Procedures (Addendum)
Patient Name: [name] [name]  MRN: [id number]  Epilepsy Attending: Lora Havens  Referring Physician/Provider: Dr Donnetta Simpers Duration: 08/27/2021 1142 to 08/28/2021 1142   Patient history: 71 year old female with headache, left hemianopsia left extinction and left hemiparesis.  EEG to evaluate for seizure.   Level of alertness: Awake, asleep   AEDs during EEG study: None   Technical aspects: This EEG study was done with scalp electrodes positioned according to the 10-20 International system of electrode placement. Electrical activity was acquired at a sampling rate of 500Hz  and reviewed with a high frequency filter of 70Hz  and a low frequency filter of 1Hz . EEG data were recorded continuously and digitally stored.    Description: The posterior dominant rhythm consists of 8 Hz activity of moderate voltage (25-35 uV) seen predominantly in posterior head regions, symmetric and reactive to eye opening and eye closing.  Sleep was characterized by vertex waves, sleep spindles (12 to 14 Hz), maximal frontocentral region. EEG showed continuous low amplitude 3 to 6 Hz theta -delta slowing in her right frontotemporal region.  Hyperventilation and photic stimulation were not performed.      ABNORMALITY - Continuous slow, right frontotemporal region   IMPRESSION: This study is suggestive of cortical dysfunction arising from  right frontotemporal region, nonspecific etiology but could be secondary to underlying structural abnormality, postictal state. No seizures or definite epileptiform discharges were seen throughout the recording.     [name] [name]

## 2021-08-28 NOTE — Care Management Important Message (Signed)
Important Message  Patient Details  Name: [name] [name] MRN: [id number] Date of Birth: Nov 10, 1950   Medicare Important Message Given:  Yes     Jeson Camacho 08/28/2021, 2:30 PM

## 2021-08-28 NOTE — Progress Notes (Signed)
LTM D/C'd. Patient had no skin break down and was happy to get wires off. [facility] was notified.

## 2021-08-28 NOTE — Progress Notes (Signed)
PT Cancellation Note  Patient Details Name: [name] [name] MRN: [id number] DOB: [birth year]   Cancelled Treatment:    Reason Eval/Treat Not Completed: Patient at procedure or test/unavailable.  Having EEG, test end time is not currently known.  Follow up as time and pt allow.  Pt is asking to leave today per chart and nsg.   Ramond Dial 08/28/2021, 11:26 AM  Mee Hives, PT PhD Acute Rehab Dept. Number: [place] and [place]
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
## 2021-08-28 NOTE — Progress Notes (Signed)
Pt's sister is now at bedside.
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
## 2021-08-28 NOTE — Progress Notes (Signed)
Inpatient Rehabilitation Admissions Coordinator   I met at bedside with patient to discuss rehab options. She is adamant that she want to go home today. I have alerted Dr Pietro Cassis of her wishes.  Danne Baxter, RN, MSN Rehab Admissions Coordinator [phone] 08/28/2021 11:03 AM

## 2021-08-28 NOTE — Progress Notes (Signed)
LTM maintenance performed. No skin breakdown. Test button was tested. [facility] notified.

## 2021-08-28 NOTE — Plan of Care (Signed)
°  Problem: Education: Goal: Knowledge of disease or condition will improve Outcome: Progressing Goal: Understanding of discharge needs will improve Outcome: Progressing   Problem: Health Behavior/Discharge Planning: Goal: Ability to identify changes in lifestyle to reduce recurrence of condition will improve Outcome: Progressing Goal: Identification of resources available to assist in meeting health care needs will improve Outcome: Progressing   Problem: Physical Regulation: Goal: Complications related to the disease process, condition or treatment will be avoided or minimized Outcome: Progressing   Problem: Safety: Goal: Ability to remain free from injury will improve Outcome: Progressing

## 2021-08-28 NOTE — Progress Notes (Signed)
PROGRESS NOTE  [name] [name]  DOB: Apr 14, 1951  PCP: Colon Branch, MD LKG:[id number]  DOA: 08/25/2021  LOS: 3 days  Hospital Day: 4  Chief Complaint  Patient presents with   Altered Mental Status   Brief narrative: [name] [name] is a 72 y.o. female with PMH significant for HTN, HLD, ocular migraine, non-small cell lung cancer in 2013 status post lobectomy, chemo and prophylactic cranial irradiation, noncompliance to medications who lives by self and gets regular stops by her sister. Patient presented to the ED on 08/25/2021 with altered mental status, and new left-sided weakness causing gait impairment.  Patient has chronic dysphagia which he states is because of ' big uvula'. Patient also reported that she has ongoing headache for 3 weeks after a fall slamming her face into the ground.  In the ED, patient had a temperature of 100, heart rate elevated persistently to over 110, blood pressure in 150s, breathing on room air CT scan of head did not show any acute intracranial abnormality MRI brain showed cerebral atrophy but did not show any acute reversible finding, no evidence of metastatic disease. CTA head and neck did not show any large vessel occlusion or hemodynamically significant stenosis. Admitted to hospitalist service.. Neurology consulted.  Subjective: Patient was seen and examined this morning.   Propped up in bed.  Alert, awake.  Currently undergoing continuous EEG monitoring.  Patient stated multiple times that she wants to go home and does not want to go to rehab.  Assessment/Plan: Fall leading to neurological symptoms -Patient reportedly had a fall 3 weeks ago slamming her face to the ground.  She started having headache, intermittent confusion, gait disturbance since then.  In the ED, she was noted to have left-sided weakness and also a concern of left-sided neglect. -Imagings negative for stroke or any other injury related to fall. -Neurology consult  appreciated. -Unclear etiology of her symptoms at this time.  Could be related to concussion. -Ongoing long-term EEG monitoring at this time. -TSH slightly elevated, vitamin B12 normal, ammonia level normal, RPR negative. -Headache cocktail started by neurology. -Continue to monitor mental status change  Acute metabolic encephalopathy -Patient has intermittent confusion.  She is oriented to place and person but not to time.  Imagings and labs unremarkable.  Ongoing long-term EEG. -Continue reorientation efforts.   Chronic dysphagia -Patient states that she has chronic dysphagia.  She was told in the past by her dentist that she has a large uvula.  Patient denies any history of esophageal stretching. -Seen by speech therapy.  Regular diet started.  Hypertension, sinus tachycardia -Noncompliant to medications at home.   -Currently on oral Coreg I started her on oral Coreg with gradual improvement in blood pressure and heart rate reading.  I would increase the dose of Coreg to 6.25 mg daily. -IV hydralazine as needed.    HLD -HDL 67, LDL 98.   History of non-small cell lung CA -non-small cell lung cancer in 2013 status post lobectomy, chemo and prophylactic cranial irradiation -Annual chest CT and follow-up with oncology.  Last visit was June 2022, CT chest without contrast due to contrast shortage.  Vitamin B12 deficiency -Continue vitamin B12 supplement.   Mobility: PT evaluation obtained.  CIR recommended. Living condition: Lives at home alone Goals of care:   Code Status: Full Code  Nutritional status: There is no height or weight on file to calculate BMI.      Diet:  Diet Order  Diet regular Room service appropriate? No; Fluid consistency: Thin  Diet effective now                  DVT prophylaxis:  enoxaparin (LOVENOX) injection 40 mg Start: 08/25/21 1830   Antimicrobials: None Fluid: Not on IV fluid Consultants: Neurology Family Communication: None  at bedside  Status is: Inpatient  Continue in-hospital care because:, EEG pending, CIR pending Level of care: Telemetry Medical   Dispo: The patient is from: Home              Anticipated d/c is to: CIR recommended but patient states he would rather want to go home.  Family wants to choose CIR though.              Patient currently is not medically stable to d/c.   Difficult to place patient No     Infusions:   sodium chloride 100 mL/hr (08/28/21 0924)   thiamine injection      Scheduled Meds:   stroke: mapping our early stages of recovery book   Does not apply Once   aspirin EC  81 mg Oral Daily   carvedilol  6.25 mg Oral BID WC   cholecalciferol  4,000 Units Oral Daily   citalopram  20 mg Oral Daily   diphenhydrAMINE  12.5 mg Intravenous Once   enoxaparin (LOVENOX) injection  40 mg Subcutaneous O83G   folic acid  1 mg Oral Daily   multivitamin with minerals  1 tablet Oral Daily   OLANZapine  5 mg Intramuscular Once   [START ON 09/03/2021] thiamine injection  100 mg Intravenous Daily   vitamin B-12  2,000 mcg Oral Daily    PRN meds: acetaminophen **OR** acetaminophen (TYLENOL) oral liquid 160 mg/5 mL **OR** acetaminophen, acetaminophen, butalbital-acetaminophen-caffeine, hydrALAZINE, labetalol, LORazepam **OR** LORazepam, metoprolol tartrate, senna-docusate   Antimicrobials: Anti-infectives (From admission, onward)    None       Objective: Vitals:   08/28/21 0733 08/28/21 0910  BP: 130/80 (!) 144/76  Pulse: 97 93  Resp: 20   Temp: 98.3 F (36.8 C) 98.1 F (36.7 C)  SpO2: 96% 98%    Intake/Output Summary (Last 24 hours) at 08/28/2021 1023 Last data filed at 08/28/2021 5498 Gross per 24 hour  Intake 1190.84 ml  Output 700 ml  Net 490.84 ml   There were no vitals filed for this visit. Weight change:  There is no height or weight on file to calculate BMI.   Physical Exam: General exam: Pleasant, elderly Caucasian female.  Not in physical distress Skin:  No rashes, lesions or ulcers. HEENT: Atraumatic, normocephalic, no obvious bleeding Lungs: Clear to auscultation bilaterally CVS: Regular rate and rhythm, no murmur GI/Abd soft, nontender, nondistended, bowel sound present CNS: Alert, awake, oriented to place and person, not to time.  Not restless or agitated. Psychiatry: Mood appropriate Extremities: No pedal edema, no calf tenderness  Data Review: I have personally reviewed the laboratory data and studies available.  F/u labs ordered Unresulted Labs (From admission, onward)     Start     Ordered   08/27/21 0500  Vitamin E  Tomorrow morning,   R        08/26/21 2641            Signed, Terrilee Croak, MD [facility] 08/28/2021

## 2021-08-28 NOTE — Progress Notes (Signed)
Occupational Therapy Treatment Patient Details Name: [name] [name] MRN: [id number] DOB: 07-09-1951 Today's Date: 08/28/2021   History of present illness Pt is a 71 y/o female who presents with difficulty walking, L side weakness, R side neglect, AMS. CT negative and MRI currently pending. Pt admitted with supected stroke. PMH significant for lung cancer (stage III currently in remission s/p lobectomy, chemotherapy, and prophylactic cranial irradiation), melanoma, osteoporosis, HTN, ocular migraines, pelvic fracture.   OT comments  Patient in bed on EEG and cleared by neurologist to perform therapy at bed level.  Patient led in AROM exercises to BUE with limited LUE shoulder ROM noted.  Visual scanning performed with patient able to identify and retrieve items on left using LUE.  Acute OT to continue to follow.    Recommendations for follow up therapy are one component of a multi-disciplinary discharge planning process, led by the attending physician.  Recommendations may be updated based on patient status, additional functional criteria and insurance authorization.    Follow Up Recommendations  Acute inpatient rehab (3hours/day)    Assistance Recommended at Discharge Frequent or constant Supervision/Assistance  Patient can return home with the following  A lot of help with walking and/or transfers;A lot of help with bathing/dressing/bathroom;Assistance with cooking/housework;Direct supervision/assist for medications management;Direct supervision/assist for financial management;Assist for transportation;Help with stairs or ramp for entrance   Equipment Recommendations  BSC/3in1    Recommendations for Other Services      Precautions / Restrictions Precautions Precautions: Fall Precaution Comments: L neglect       Mobility Bed Mobility                    Transfers                         Balance                                            ADL either performed or assessed with clinical judgement   ADL                                              Extremity/Trunk Assessment Upper Extremity Assessment LUE Sensation: decreased light touch;decreased proprioception LUE Coordination: decreased fine motor;decreased gross motor            Vision       Perception     Praxis      Cognition Arousal/Alertness: Awake/alert Behavior During Therapy: Flat affect Overall Cognitive Status: Impaired/Different from baseline Area of Impairment: Attention;Orientation;Memory;Following commands;Safety/judgement;Awareness;Problem solving                   Current Attention Level: Focused Memory: Decreased short-term memory Following Commands: Follows one step commands inconsistently Safety/Judgement: Decreased awareness of safety;Decreased awareness of deficits Awareness: Intellectual Problem Solving: Slow processing;Decreased initiation;Difficulty sequencing;Requires verbal cues;Requires tactile cues General Comments: increased awareness of left visual field          Exercises Exercises: General Upper Extremity General Exercises - Upper Extremity Shoulder Flexion: AROM;Both;10 reps Shoulder Extension: AROM;10 reps;Both Shoulder ABduction: AROM;Both;10 reps Shoulder ADduction: AROM;10 reps;Both Elbow Flexion: AROM;Both;10 reps Elbow Extension: AROM;Both;10 reps   Shoulder Instructions       General Comments item identification on left side  with patient ale to identify 3/3 items    Pertinent Vitals/ Pain       Pain Assessment: No/denies pain  Home Living                                          Prior Functioning/Environment              Frequency  Min 2X/week        Progress Toward Goals  OT Goals(current goals can now be found in the care plan section)  Progress towards OT goals: Progressing toward goals  Acute Rehab OT Goals Patient Stated Goal: go  home OT Goal Formulation: With patient Time For Goal Achievement: 09/09/21 Potential to Achieve Goals: Good ADL Goals Pt Will Perform Eating: with supervision;with set-up;sitting Pt Will Perform Grooming: with set-up;with supervision;sitting Pt Will Perform Upper Body Bathing: sitting;with min assist Pt Will Perform Lower Body Bathing: with min assist;sit to/from stand Pt Will Transfer to Toilet: with min assist;bedside commode Additional ADL Goal #1: Pt will identify 3/5 objects to L of midline  Plan Discharge plan remains appropriate    Co-evaluation                 AM-PAC OT "6 Clicks" Daily Activity     Outcome Measure   Help from another person eating meals?: A Lot Help from another person taking care of personal grooming?: A Lot Help from another person toileting, which includes using toliet, bedpan, or urinal?: A Lot Help from another person bathing (including washing, rinsing, drying)?: A Lot Help from another person to put on and taking off regular upper body clothing?: A Lot Help from another person to put on and taking off regular lower body clothing?: A Lot 6 Click Score: 12    End of Session    OT Visit Diagnosis: Unsteadiness on feet (R26.81);Other abnormalities of gait and mobility (R26.89);Muscle weakness (generalized) (M62.81);Other symptoms and signs involving the nervous system (R29.898);Other symptoms and signs involving cognitive function   Activity Tolerance Patient tolerated treatment well   Patient Left in bed;with call bell/phone within reach;with chair alarm set;with family/visitor present (patient on EEG)   Nurse Communication Mobility status        Time: 5852-7782 OT Time Calculation (min): 23 min  Charges: OT General Charges $OT Visit: 1 Visit OT Treatments $Therapeutic Activity: 8-22 mins $Therapeutic Exercise: 8-22 mins  Lodema Hong, [place]  Pager [phone] Office [place] 08/28/2021, 3:04 PM

## 2021-08-29 LAB — VITAMIN E
Vitamin E (Alpha Tocopherol): 10.9 mg/L (ref 9.0–29.0)
Vitamin E(Gamma Tocopherol): 1.3 mg/L (ref 0.5–4.9)

## 2021-08-29 NOTE — Procedures (Signed)
Patient Name: [name] [name]  MRN: [id number]  Epilepsy Attending: Lora Havens  Referring Physician/Provider: Dr Donnetta Simpers Duration: 08/28/2021 1142 to 08/28/2021  1650   Patient history: 71 year old female with headache, left hemianopsia left extinction and left hemiparesis.  EEG to evaluate for seizure.   Level of alertness: Awake, asleep   AEDs during EEG study: None   Technical aspects: This EEG study was done with scalp electrodes positioned according to the 10-20 International system of electrode placement. Electrical activity was acquired at a sampling rate of 500Hz  and reviewed with a high frequency filter of 70Hz  and a low frequency filter of 1Hz . EEG data were recorded continuously and digitally stored.    Description: The posterior dominant rhythm consists of 8 Hz activity of moderate voltage (25-35 uV) seen predominantly in posterior head regions, symmetric and reactive to eye opening and eye closing.  Sleep was characterized by vertex waves, sleep spindles (12 to 14 Hz), maximal frontocentral region. EEG showed continuous low amplitude 3 to 6 Hz theta -delta slowing in her right frontotemporal region.  Hyperventilation and photic stimulation were not performed.      ABNORMALITY - Continuous slow, right frontotemporal region   IMPRESSION: This study is suggestive of cortical dysfunction arising from  right frontotemporal region, nonspecific etiology but could be secondary to underlying structural abnormality, postictal state. No seizures or definite epileptiform discharges were seen throughout the recording.     Derreon Consalvo Barbra Sarks

## 2021-08-29 NOTE — Progress Notes (Signed)
Physical Therapy Treatment Patient Details Name: [name] [name] MRN: [id number] DOB: 02/13/1951 Today's Date: 08/29/2021   History of Present Illness Pt is a 71 y/o female who presents 1/1 with difficulty walking, L side weakness, R side neglect, AMS. CT negative and MRI currently pending. Pt admitted with supected stroke. PMH significant for lung cancer (stage III currently in remission s/p lobectomy, chemotherapy, and prophylactic cranial irradiation), melanoma, osteoporosis, HTN, ocular migraines, pelvic fracture.    PT Comments    Pt progressing towards physical therapy goals. Was able to perform transfers and ambulation with up to heavy mod assist for balance support and safety to prevent falls during functional mobility. Pt with overall poor understanding of deficits and safety. L inattention is improving, however still requires significant cues to attend to her L side at times. Pt scored 3/24 on the DGI this session. Continue to feel this patient would benefit from intensive, multidisciplinary rehab available at the AIR level. Had a long discussion with the pt regarding rehab options and safety. She is reluctantly agreeable, however unsure if she fully understands deficits and implications to her daily life if she were to return home alone. At the end of the session, pt reports she would be interested in AIR. CM updated. Will continue to follow and progress as able per POC.    Recommendations for follow up therapy are one component of a multi-disciplinary discharge planning process, led by the attending physician.  Recommendations may be updated based on patient status, additional functional criteria and insurance authorization.  Follow Up Recommendations  Acute inpatient rehab (3hours/day)     Assistance Recommended at Discharge Frequent or constant Supervision/Assistance  Patient can return home with the following A lot of help with walking and/or transfers;Two people to help with  walking and/or transfers;Two people to help with bathing/dressing/bathroom;Assistance with cooking/housework;Direct supervision/assist for medications management;Direct supervision/assist for financial management;Assist for transportation;Help with stairs or ramp for entrance   Equipment Recommendations  None recommended by PT    Recommendations for Other Services Rehab consult     Precautions / Restrictions Precautions Precautions: Fall Precaution Comments: L neglect Restrictions Weight Bearing Restrictions: No     Mobility  Bed Mobility Overal bed mobility: Needs Assistance Bed Mobility: Supine to Sit     Supine to sit: Min guard     General bed mobility comments: Increased time. Close guard for safety.    Transfers Overall transfer level: Needs assistance Equipment used: 1 person hand held assist Transfers: Sit to/from Stand Sit to Stand: Min assist           General transfer comment: Assist for balance support and safety. Poor understanding of Purewick attached to her (Purewick in briefs) and moving without regard to lines. Required specific cues to stop so I could unhook the Purewick from the tubing.    Ambulation/Gait Ambulation/Gait assistance: Mod assist Gait Distance (Feet): 200 Feet Assistive device: 1 person hand held assist Gait Pattern/deviations: Step-through pattern;Decreased stride length;Staggering left;Drifts right/left;Trunk flexed Gait velocity: Decreased Gait velocity interpretation: 1.31 - 2.62 ft/sec, indicative of limited community ambulator   General Gait Details: L inattention, heavy drifting to the L with frequent staggering to the L. Pt required almost constant assist (up to heavy mod assist) to prevent falls.   Stairs             Wheelchair Mobility    Modified Rankin (Stroke Patients Only) Modified Rankin (Stroke Patients Only) Pre-Morbid Rankin Score: No significant disability Modified Rankin: Moderately severe  disability  Balance Overall balance assessment: Needs assistance Sitting-balance support: Bilateral upper extremity supported;Feet supported Sitting balance-Leahy Scale: Fair     Standing balance support: Bilateral upper extremity supported;During functional activity Standing balance-Leahy Scale: Poor                   Standardized Balance Assessment Standardized Balance Assessment : Dynamic Gait Index   Dynamic Gait Index Level Surface: Moderate Impairment Change in Gait Speed: Moderate Impairment Gait with Horizontal Head Turns: Severe Impairment Gait with Vertical Head Turns: Severe Impairment Gait and Pivot Turn: Severe Impairment Step Over Obstacle: Severe Impairment Step Around Obstacles: Severe Impairment Steps: Moderate Impairment Total Score: 3      Cognition Arousal/Alertness: Awake/alert Behavior During Therapy: Flat affect Overall Cognitive Status: Impaired/Different from baseline Area of Impairment: Orientation;Attention;Memory;Following commands;Safety/judgement;Awareness;Problem solving                 Orientation Level: Situation Current Attention Level: Selective Memory: Decreased short-term memory Following Commands: Follows one step commands with increased time Safety/Judgement: Decreased awareness of deficits Awareness: Intellectual Problem Solving: Slow processing;Requires verbal cues;Requires tactile cues          Exercises      General Comments        Pertinent Vitals/Pain Pain Assessment: No/denies pain    Home Living                          Prior Function            PT Goals (current goals can now be found in the care plan section) Acute Rehab PT Goals Patient Stated Goal: "Be home with Punkin" (her dog) PT Goal Formulation: With patient Time For Goal Achievement: 09/09/21 Potential to Achieve Goals: Good Progress towards PT goals: Progressing toward goals    Frequency    Min 4X/week       PT Plan Current plan remains appropriate    Co-evaluation              AM-PAC PT "6 Clicks" Mobility   Outcome Measure  Help needed turning from your back to your side while in a flat bed without using bedrails?: A Little Help needed moving from lying on your back to sitting on the side of a flat bed without using bedrails?: A Lot Help needed moving to and from a bed to a chair (including a wheelchair)?: Total Help needed standing up from a chair using your arms (e.g., wheelchair or bedside chair)?: Total Help needed to walk in hospital room?: Total Help needed climbing 3-5 steps with a railing? : Total 6 Click Score: 9    End of Session Equipment Utilized During Treatment: Gait belt Activity Tolerance: Patient tolerated treatment well;Treatment limited secondary to agitation Patient left: in bed;with call bell/phone within reach;with bed alarm set;with nursing/sitter in room Nurse Communication: Mobility status PT Visit Diagnosis: Unsteadiness on feet (R26.81);Other symptoms and signs involving the nervous system (R29.898)     Time: 4401-0272 PT Time Calculation (min) (ACUTE ONLY): 30 min  Charges:  $Gait Training: 8-22 mins $Physical Performance Test: 8-22 mins                     Rolinda Roan, PT, DPT Acute Rehabilitation Services Pager: [phone] Office: [phone]    Thelma Comp 08/29/2021, 4:05 PM

## 2021-08-29 NOTE — Progress Notes (Signed)
Speech Language Pathology Treatment: Dysphagia  Patient Details Name: [name] [name] MRN: [id number] DOB: April 18, 1951 Today's Date: 08/29/2021 Time: 6063-0160 SLP Time Calculation (min) (ACUTE ONLY): 20 min  Assessment / Plan / Recommendation Clinical Impression  Pt seen for dysphagia tx with intake of regular/thin liquids and min verbal cueing provided d/t decreased sustained attention and speaking during consumption intermittently.  Pt did not exhibit any s/s of overt aspiration with verbal cues provided, but continues to exhibit L inattention and min impulsivity during meals.  She also has decreased awareness of deficits, but has shown improvement with attending to L side when speaking in conversation.  Pt continues to have decreased safety awareness of deficits and would benefit from a Short-term rehab stay if agreeable d/t concerns with attention/awareness.  Recommend continue current diet of Regular/thin liquids with ST continuing to f/u for cognitive deficits while in acute setting and/or CIR if appropriate.   HPI HPI: 71 y.o. female with medical history significant of ocular migraines, HTN noncompliant with BP meds, HLD, non-small cell lung cancer 2013 status post lobectomy, chemotherapy and prophylactic cranial irradiation same year, and has been on observation with oncology, anxiety/depression, alcohol abuse, presented with after mentations.     Patient is confused, most history provided by patient's sister at bedside.  Patient lives by herself with sister stops by regularly.  Sister reported patient started to have gait problems since yesterday, and sister thinks patient has a newly developed left-sided weakness, with limping toward the left side; MRI 08/26/21 No acute or reversible finding. No evidence of metastatic  disease.  2. Cerebral atrophy; Repeat MRI pending; CXR 08/25/21 negative for acute processes: Pt followed by ST for cognitive changes/dysphagia.      SLP Plan  Continue with  current plan of care      Recommendations for follow up therapy are one component of a multi-disciplinary discharge planning process, led by the attending physician.  Recommendations may be updated based on patient status, additional functional criteria and insurance authorization.    Recommendations  Diet recommendations: Regular;Thin liquid Liquids provided via: Cup;Straw Medication Administration: Whole meds with puree (or with liquids if able; c/o difficulty with globus sensation) Supervision: Patient able to self feed;Intermittent supervision to cue for compensatory strategies Compensations: Slow rate;Small sips/bites                General recommendations: Other(comment) (TBD) Oral Care Recommendations: Oral care BID Follow Up Recommendations: Acute inpatient rehab (3hours/day) Assistance recommended at discharge: Frequent or constant Supervision/Assistance SLP Visit Diagnosis: Attention and concentration deficit;Cognitive communication deficit (R41.841) Attention and concentration deficit following: Other cerebrovascular disease Plan: Continue with current plan of care           Elvina Sidle, M.S., CCC-SLP  08/29/2021, 3:23 PM

## 2021-08-29 NOTE — Progress Notes (Signed)
Inpatient Rehabilitation Admissions Coordinator   I met at bedside with patient and with her permission contacted her sister, [name], by phone. We discussed goals and expectations of a possible Cir admit. She was at [facility] 12/21 and is in agreement.I will discuss with Rehab MD and follow up tomorrow with a possible Cir admit.  Danne Baxter, RN, MSN Rehab Admissions Coordinator [phone] 08/29/2021 5:29 PM

## 2021-08-29 NOTE — Progress Notes (Signed)
PROGRESS NOTE  [name] [name]  DOB: 10/15/1950  PCP: Colon Branch, MD GMW:[id number]  DOA: 08/25/2021  LOS: 4 days  Hospital Day: 5  Chief Complaint  Patient presents with   Altered Mental Status   Brief narrative: [name] [name] is a 71 y.o. female with PMH significant for HTN, HLD, ocular migraine, non-small cell lung cancer in 2013 status post lobectomy, chemo and prophylactic cranial irradiation, noncompliance to medications who lives by self and gets regular stops by her sister. Patient presented to the ED on 08/25/2021 with altered mental status, and new left-sided weakness causing gait impairment.  Patient has chronic dysphagia which he states is because of ' big uvula'. Patient also reported that she has ongoing headache for 3 weeks after a fall slamming her face into the ground.  In the ED, patient had a temperature of 100, heart rate elevated persistently to over 110, blood pressure in 150s, breathing on room air CT scan of head did not show any acute intracranial abnormality MRI brain showed cerebral atrophy but did not show any acute reversible finding, no evidence of metastatic disease. CTA head and neck did not show any large vessel occlusion or hemodynamically significant stenosis. Admitted to hospitalist service.. Neurology consulted.  Subjective: Patient was seen and examined this morning Elderly Caucasian female.  Lying down in bed.  Weak but has a strong determination to go home.  I had a conversation with her and her sister on the phone for 20 minutes.  I tried to make patient understand the level of her weakness and the need to go to a SNF.  Patient keeps on repeating that she wants to go home.  She is oriented to place and person and to time with difficulty.  She understands the risk of fall, significant morbidity and mortality of living alone at home with this level of weakness and no support.  I do not think patient is making a rational choice.   Psychiatry consultation called for capacity evaluation.  Assessment/Plan: Fall leading to neurological symptoms -Patient reportedly had a fall 3 weeks ago slamming her face to the ground.  She started having headache, intermittent confusion, gait disturbance since then.  In the ED, she was noted to have left-sided weakness and also a concern of left-sided neglect. -Imagings negative for stroke or any other injury related to fall. -Neurology consult appreciated. -Unclear etiology of her symptoms at this time.  Could be related to concussion. -Completed EEG monitoring.  Per neurology, EEG did not show any seizure definite epileptiform discharges but suggestive of cortical dysfunction arising from right frontotemporal region likely secondary to underlying structural abnormality, postictal state. -Headache improved  Acute metabolic encephalopathy -Patient has intermittent confusion.  She is oriented to place and person but not to time.  Imagings and labs unremarkable.  Ongoing long-term EEG. -Continue reorientation efforts.   Chronic dysphagia -Patient states that she has chronic dysphagia.  She was told in the past by her dentist that she has a large uvula.  Patient denies any history of esophageal stretching. -Seen by speech therapy.  Regular diet started.  Hypertension, sinus tachycardia -Noncompliant to medications at home.   -Currently on oral Coreg at 6.25 mg twice daily. -IV hydralazine as needed.    HLD -HDL 67, LDL 98.   History of non-small cell lung CA -non-small cell lung cancer in 2013 status post lobectomy, chemo and prophylactic cranial irradiation -Annual chest CT and follow-up with oncology.  Last visit was June 2022, CT  chest without contrast due to contrast shortage.  Vitamin B12 deficiency -Continue vitamin B12 supplement.   Mobility: PT evaluation obtained.  CIR recommended. Living condition: Lives at home alone Goals of care:   Code Status: Full Code   Nutritional status: Body mass index is 28.87 kg/m.      Diet:  Diet Order             Diet regular Room service appropriate? No; Fluid consistency: Thin  Diet effective now                  DVT prophylaxis:  enoxaparin (LOVENOX) injection 40 mg Start: 08/25/21 1830   Antimicrobials: None Fluid: Not on IV fluid Consultants: Neurology Family Communication: None at bedside  Status is: Inpatient  Continue in-hospital care because -patient does not have a safe discharge plan yet.  Level of care: Telemetry Medical   Dispo: The patient is from: Home              Anticipated d/c is to: CIR versus home              patient currently is medically stable to d/c.   Difficult to place patient No     Infusions:   sodium chloride 100 mL/hr (08/28/21 0924)   thiamine injection 250 mg (08/29/21 2049)    Scheduled Meds:   stroke: mapping our early stages of recovery book   Does not apply Once   aspirin EC  81 mg Oral Daily   carvedilol  6.25 mg Oral BID WC   cholecalciferol  4,000 Units Oral Daily   citalopram  20 mg Oral Daily   enoxaparin (LOVENOX) injection  40 mg Subcutaneous D78E   folic acid  1 mg Oral Daily   multivitamin with minerals  1 tablet Oral Daily   OLANZapine  5 mg Intramuscular Once   [START ON 09/03/2021] thiamine injection  100 mg Intravenous Daily   vitamin B-12  2,000 mcg Oral Daily    PRN meds: acetaminophen **OR** acetaminophen (TYLENOL) oral liquid 160 mg/5 mL **OR** acetaminophen, acetaminophen, butalbital-acetaminophen-caffeine, hydrALAZINE, labetalol, LORazepam, metoprolol tartrate, senna-docusate   Antimicrobials: Anti-infectives (From admission, onward)    None       Objective: Vitals:   08/29/21 1539 08/29/21 2002  BP: 135/74 (!) 141/87  Pulse: 91 88  Resp: 16 18  Temp: 97.7 F (36.5 C) 98 F (36.7 C)  SpO2: 100% 99%    Intake/Output Summary (Last 24 hours) at 08/29/2021 2058 Last data filed at 08/29/2021 0800 Gross per 24  hour  Intake --  Output 1725 ml  Net -1725 ml    Filed Weights   08/29/21 1123  Weight: 71.6 kg   Weight change:  Body mass index is 28.87 kg/m.   Physical Exam: General exam: Pleasant, elderly Caucasian female.  Not in physical distress Skin: No rashes, lesions or ulcers. HEENT: Atraumatic, normocephalic, no obvious bleeding Lungs: Clear to auscultation bilaterally CVS: Regular rate and rhythm, no murmur GI/Abd soft, nontender, nondistended, bowel sound present CNS: Alert, awake, oriented to place and person, to time with difficulty. Not restless or agitated. Psychiatry: Mood appropriate Extremities: No pedal edema, no calf tenderness  Data Review: I have personally reviewed the laboratory data and studies available.  F/u labs ordered Unresulted Labs (From admission, onward)    None       Signed, Terrilee Croak, MD [facility] 08/29/2021

## 2021-08-30 ENCOUNTER — Encounter (HOSPITAL_COMMUNITY): Payer: Self-pay | Admitting: Physical Medicine & Rehabilitation

## 2021-08-30 ENCOUNTER — Inpatient Hospital Stay (HOSPITAL_COMMUNITY)
Admission: RE | Admit: 2021-08-30 | Discharge: 2021-09-04 | DRG: 103 | Disposition: A | Discharge status: Home / Self Care | Payer: Medicare Other | Source: Intra-hospital | Attending: Physical Medicine & Rehabilitation | Admitting: Physical Medicine & Rehabilitation

## 2021-08-30 ENCOUNTER — Other Ambulatory Visit: Payer: Self-pay

## 2021-08-30 DIAGNOSIS — F0781 Postconcussional syndrome: Principal | ICD-10-CM

## 2021-08-30 DIAGNOSIS — J449 Chronic obstructive pulmonary disease, unspecified: Secondary | ICD-10-CM | POA: Diagnosis present

## 2021-08-30 DIAGNOSIS — Z923 Personal history of irradiation: Secondary | ICD-10-CM

## 2021-08-30 DIAGNOSIS — D62 Acute posthemorrhagic anemia: Secondary | ICD-10-CM | POA: Diagnosis present

## 2021-08-30 DIAGNOSIS — F1090 Alcohol use, unspecified, uncomplicated: Secondary | ICD-10-CM

## 2021-08-30 DIAGNOSIS — F1020 Alcohol dependence, uncomplicated: Secondary | ICD-10-CM

## 2021-08-30 DIAGNOSIS — F1010 Alcohol abuse, uncomplicated: Secondary | ICD-10-CM | POA: Diagnosis present

## 2021-08-30 DIAGNOSIS — E538 Deficiency of other specified B group vitamins: Secondary | ICD-10-CM

## 2021-08-30 DIAGNOSIS — G9341 Metabolic encephalopathy: Secondary | ICD-10-CM

## 2021-08-30 DIAGNOSIS — Z85118 Personal history of other malignant neoplasm of bronchus and lung: Secondary | ICD-10-CM

## 2021-08-30 DIAGNOSIS — G9340 Encephalopathy, unspecified: Secondary | ICD-10-CM | POA: Diagnosis not present

## 2021-08-30 DIAGNOSIS — R4189 Other symptoms and signs involving cognitive functions and awareness: Secondary | ICD-10-CM

## 2021-08-30 DIAGNOSIS — G43109 Migraine with aura, not intractable, without status migrainosus: Secondary | ICD-10-CM | POA: Diagnosis present

## 2021-08-30 DIAGNOSIS — K5901 Slow transit constipation: Secondary | ICD-10-CM | POA: Diagnosis present

## 2021-08-30 DIAGNOSIS — F1011 Alcohol abuse, in remission: Secondary | ICD-10-CM

## 2021-08-30 DIAGNOSIS — F0390 Unspecified dementia without behavioral disturbance: Secondary | ICD-10-CM

## 2021-08-30 DIAGNOSIS — G44309 Post-traumatic headache, unspecified, not intractable: Secondary | ICD-10-CM

## 2021-08-30 DIAGNOSIS — I10 Essential (primary) hypertension: Secondary | ICD-10-CM | POA: Diagnosis present

## 2021-08-30 DIAGNOSIS — G4700 Insomnia, unspecified: Secondary | ICD-10-CM | POA: Diagnosis present

## 2021-08-30 MED ORDER — DIPHENHYDRAMINE HCL 12.5 MG/5ML PO ELIX: 12.5000 mg | ORAL_SOLUTION | Freq: Four times a day (QID) | ORAL | Status: DC | PRN

## 2021-08-30 MED ORDER — CITALOPRAM HYDROBROMIDE 10 MG PO TABS
20.0000 mg | ORAL_TABLET | Freq: Every day | ORAL | Status: DC
  Administered 2021-08-31 – 2021-09-04 (×5): 20 mg via ORAL
  Filled 2021-08-30 (×5): qty 2

## 2021-08-30 MED ORDER — POLYETHYLENE GLYCOL 3350 17 G PO PACK: 17.0000 g | PACK | Freq: Every day | ORAL | Status: DC | PRN

## 2021-08-30 MED ORDER — ALUM & MAG HYDROXIDE-SIMETH 200-200-20 MG/5ML PO SUSP: 30.0000 mL | ORAL | Status: DC | PRN

## 2021-08-30 MED ORDER — LORAZEPAM 2 MG/ML IJ SOLN
1.0000 mg | Freq: Four times a day (QID) | INTRAMUSCULAR | Status: DC | PRN
  Administered 2021-08-30: 1 mg via INTRAMUSCULAR
  Filled 2021-08-30: qty 1

## 2021-08-30 MED ORDER — ASPIRIN EC 81 MG PO TBEC
81.0000 mg | DELAYED_RELEASE_TABLET | Freq: Every day | ORAL | Status: DC
  Administered 2021-08-31 – 2021-09-04 (×5): 81 mg via ORAL
  Filled 2021-08-30 (×5): qty 1

## 2021-08-30 MED ORDER — ACETAMINOPHEN 325 MG PO TABS
325.0000 mg | ORAL_TABLET | ORAL | Status: DC | PRN
  Administered 2021-08-30: 650 mg via ORAL
  Filled 2021-08-30: qty 2

## 2021-08-30 MED ORDER — HYDRALAZINE HCL 10 MG PO TABS: 10.0000 mg | ORAL_TABLET | Freq: Four times a day (QID) | ORAL | Status: DC | PRN

## 2021-08-30 MED ORDER — ENOXAPARIN SODIUM 40 MG/0.4ML IJ SOSY
40.0000 mg | PREFILLED_SYRINGE | INTRAMUSCULAR | Status: DC
  Administered 2021-08-30 – 2021-09-03 (×5): 40 mg via SUBCUTANEOUS
  Filled 2021-08-30 (×5): qty 0.4

## 2021-08-30 MED ORDER — FOLIC ACID 1 MG PO TABS
1.0000 mg | ORAL_TABLET | Freq: Every day | ORAL | Status: DC
  Administered 2021-08-31 – 2021-09-04 (×5): 1 mg via ORAL
  Filled 2021-08-30 (×5): qty 1

## 2021-08-30 MED ORDER — ROSUVASTATIN CALCIUM 20 MG PO TABS
40.0000 mg | ORAL_TABLET | Freq: Every day | ORAL | Status: DC
  Administered 2021-09-02 – 2021-09-04 (×3): 40 mg via ORAL
  Filled 2021-08-30 (×3): qty 2

## 2021-08-30 MED ORDER — VITAMIN B-12 1000 MCG PO TABS
2000.0000 ug | ORAL_TABLET | Freq: Every day | ORAL | Status: DC
Start: 2021-08-31 — End: 2021-09-04
  Administered 2021-08-31 – 2021-09-04 (×5): 2000 ug via ORAL
  Filled 2021-08-30 (×5): qty 2

## 2021-08-30 MED ORDER — CARVEDILOL 6.25 MG PO TABS: 6.2500 mg | ORAL_TABLET | Freq: Two times a day (BID) | ORAL | Status: DC

## 2021-08-30 MED ORDER — PROCHLORPERAZINE EDISYLATE 10 MG/2ML IJ SOLN: 5.0000 mg | Freq: Four times a day (QID) | INTRAMUSCULAR | Status: DC | PRN

## 2021-08-30 MED ORDER — RALOXIFENE HCL 60 MG PO TABS
60.0000 mg | ORAL_TABLET | Freq: Every day | ORAL | Status: DC
  Administered 2021-09-02 – 2021-09-04 (×3): 60 mg via ORAL
  Filled 2021-08-30 (×3): qty 1

## 2021-08-30 MED ORDER — PROCHLORPERAZINE MALEATE 5 MG PO TABS: 5.0000 mg | ORAL_TABLET | Freq: Four times a day (QID) | ORAL | Status: DC | PRN

## 2021-08-30 MED ORDER — FLEET ENEMA 7-19 GM/118ML RE ENEM: 1.0000 | ENEMA | Freq: Once | RECTAL | Status: DC | PRN

## 2021-08-30 MED ORDER — CARVEDILOL 6.25 MG PO TABS
6.2500 mg | ORAL_TABLET | Freq: Two times a day (BID) | ORAL | Status: DC
  Administered 2021-08-30 – 2021-09-04 (×10): 6.25 mg via ORAL
  Filled 2021-08-30 (×11): qty 1

## 2021-08-30 MED ORDER — LORAZEPAM 0.5 MG PO TABS
1.0000 mg | ORAL_TABLET | Freq: Four times a day (QID) | ORAL | Status: DC | PRN
  Administered 2021-08-31 – 2021-09-02 (×3): 1 mg via ORAL
  Filled 2021-08-30 (×3): qty 2

## 2021-08-30 MED ORDER — TRAZODONE HCL 50 MG PO TABS: 25.0000 mg | ORAL_TABLET | Freq: Every evening | ORAL | Status: DC | PRN

## 2021-08-30 MED ORDER — THIAMINE HCL 100 MG/ML IJ SOLN
100.0000 mg | Freq: Every day | INTRAMUSCULAR | Status: DC
  Filled 2021-08-30: qty 2

## 2021-08-30 MED ORDER — VITAMIN D 25 MCG (1000 UNIT) PO TABS
4000.0000 [IU] | ORAL_TABLET | Freq: Every day | ORAL | Status: DC
  Administered 2021-08-31 – 2021-09-04 (×5): 4000 [IU] via ORAL
  Filled 2021-08-30 (×5): qty 4

## 2021-08-30 MED ORDER — GUAIFENESIN-DM 100-10 MG/5ML PO SYRP: 5.0000 mL | ORAL_SOLUTION | Freq: Four times a day (QID) | ORAL | Status: DC | PRN

## 2021-08-30 MED ORDER — FOLIC ACID 1 MG PO TABS: 1.0000 mg | ORAL_TABLET | Freq: Every day | ORAL | Status: DC

## 2021-08-30 MED ORDER — BUTALBITAL-APAP-CAFFEINE 50-325-40 MG PO TABS
1.0000 | ORAL_TABLET | Freq: Four times a day (QID) | ORAL | Status: DC | PRN
  Administered 2021-08-31: 1 via ORAL
  Filled 2021-08-30: qty 1

## 2021-08-30 MED ORDER — BUTALBITAL-APAP-CAFFEINE 50-325-40 MG PO TABS: 1.0000 | ORAL_TABLET | Freq: Four times a day (QID) | ORAL | 0 refills | Status: DC | PRN

## 2021-08-30 MED ORDER — BISACODYL 10 MG RE SUPP
10.0000 mg | Freq: Every day | RECTAL | Status: DC | PRN
  Administered 2021-09-01: 10 mg via RECTAL
  Filled 2021-08-30: qty 1

## 2021-08-30 MED ORDER — PROCHLORPERAZINE 25 MG RE SUPP: 12.5000 mg | Freq: Four times a day (QID) | RECTAL | Status: DC | PRN

## 2021-08-30 MED ORDER — ADULT MULTIVITAMIN W/MINERALS CH
1.0000 | ORAL_TABLET | Freq: Every day | ORAL | Status: DC
  Administered 2021-08-31 – 2021-09-04 (×5): 1 via ORAL
  Filled 2021-08-30 (×5): qty 1

## 2021-08-30 MED ORDER — THIAMINE HCL 100 MG/ML IJ SOLN
250.0000 mg | Freq: Every day | INTRAVENOUS | Status: AC
  Administered 2021-08-31 – 2021-09-02 (×3): 250 mg via INTRAVENOUS
  Filled 2021-08-30 (×4): qty 2.5

## 2021-08-30 MED ORDER — SENNOSIDES-DOCUSATE SODIUM 8.6-50 MG PO TABS: 1.0000 | ORAL_TABLET | Freq: Every evening | ORAL | Status: DC | PRN

## 2021-08-30 NOTE — PMR Pre-admission (Signed)
PMR Admission Coordinator Pre-Admission Assessment  Patient: [name] [name] is an 71 y.o., female MRN: [id number] DOB: Jul 07, 1951 Height: $RemoveBefo'5\' 2"'wxuaneVeOEl$  (157.5 cm) Weight: 71.6 kg  Insurance Information HMO:     PPO:      PCP:      IPA:      80/20:      OTHER:  PRIMARY: Medicare a and b      Policy#: [id number]      Subscriber: pt Benefits:  Phone #: passport one source online     Name: 1/6 Eff. Date: 01/23/2018     Deduct: $1600      Out of Pocket Max: none      Life Max: none CIR: 100%      SNF: 20 full days Outpatient: 80%     Co-Pay: 20% Home Health: 100%      Co-Pay: none DME: 80%     Co-Pay: 20% Providers: pt choice  SECONDARY: [employer] medicare supplement      Policy#: [id number]  Financial Counselor:       Phone#:   The Data Collection Information Summary for patients in Inpatient Rehabilitation Facilities with attached Privacy Act [place] Records was provided and verbally reviewed with: Patient and Family  Emergency Contact Information Contact Information     Name Relation Home Work Mobile   Robbins,[name] Sister [phone]  [phone]      Current Medical History  Patient Admitting Diagnosis: Debility, metabolic encephalopathy  History of Present Illness:  71 year old female with history of SCLC s/p chemo/XRT and prophylactic cranial XRT, B12 deficiency chronic LBP, anxiety d/o, pelvic Fx with CIR  07/2020 who was admitted on 08/25/21 with headaches, mild confusion, left sided weakness and right gaze preference. Per reports, patient had fall 3 weeks PTA when she struck her head with onset of persistent headaches. CTA/P head was negative for LVO or significant stenosis. MRI brain without acute changes or metastatic disease. EEG was showed nonspecific cortical dysfunction in right frontoparietal region and negative for seizures or epileptiform discharge. 2D echo showed EF 65-70% and mild concentric LVH. Per Neurology EEG did not show any seizure definite  epileptiform discharges but suggestive of cortical dysfunction arising from frontotemporal region likely secondary to underlying structural abnormality, postictal state. Patient reports chronic dysphagia. Regular diet and follow up by SLP. Noncompliant to HTN meds at home. On oral Coreg.    Complete NIHSS TOTAL: 2  Patient's medical record from [facility] has been reviewed by the rehabilitation admission coordinator and physician.  Past Medical History  Past Medical History:  Diagnosis Date   Anemia    duering cancer treatment    Anxiety    Arthritis    OA hands    Bilateral lower extremity pain    Blood transfusion without reported diagnosis    during chemo for lung cancer   Cataract    forming  but very small    Cholelithiasis    Colon polyps    adenomatous   High cholesterol    History of chemotherapy    for lung cancer- completed 2013   Hx of radiation therapy 10/07/11 to 11/20/11   right lung   Hx of radiation therapy 01/07/2012- 01/21/12   cranial irradiation   Hypertension    Lung cancer ([place]) 09/19/11   Stage III currently in remission   Malignant melanoma of skin of canthus of right eye ([place]) 02/2017   Osteoporosis 04/2014   Dexa scan by Dr. Corinna Capra, T-score -2.7, next in  2 years, with at least 13 % major fracture in 10 years   Pitting edema    Bilateral lower extremities, duplex ultrasound 04/03/2015 normal, no DVT   Has the patient had major surgery during 100 days prior to admission? No  Family History   family history includes Atrial fibrillation in her mother; Bladder Cancer in her father; Clotting disorder in her mother; Colon cancer (age of onset: 48) in her maternal aunt; Colon polyps in her father and mother; Drug abuse in her son; Heart disease in her mother; Stroke in her father.  Current Medications  Current Facility-Administered Medications:     stroke: mapping our early stages of recovery book, , Does not apply, Once, Wynetta Fines T, MD   [COMPLETED]  sodium chloride 0.9 % bolus 500 mL, 500 mL, Intravenous, Once, Stopped at 08/25/21 1704 **FOLLOWED BY** 0.9 %  sodium chloride infusion, 100 mL/hr, Intravenous, Continuous, Dorie Rank, MD, Last Rate: 100 mL/hr at 08/28/21 0924, Infusion Verify at 08/28/21 0924   acetaminophen (TYLENOL) tablet 650 mg, 650 mg, Oral, Q4H PRN **OR** acetaminophen (TYLENOL) 160 MG/5ML solution 650 mg, 650 mg, Per Tube, Q4H PRN, 650 mg at 08/26/21 1136 **OR** acetaminophen (TYLENOL) suppository 650 mg, 650 mg, Rectal, Q4H PRN, Lequita Halt, MD   acetaminophen (TYLENOL) tablet 650 mg, 650 mg, Oral, Q6H PRN, Lequita Halt, MD   aspirin EC tablet 81 mg, 81 mg, Oral, Daily, Wynetta Fines T, MD, 81 mg at 08/30/21 8347   butalbital-acetaminophen-caffeine (FIORICET) 50-325-40 MG per tablet 1 tablet, 1 tablet, Oral, Q6H PRN, Terrilee Croak, MD, 1 tablet at 08/29/21 1948   carvedilol (COREG) tablet 6.25 mg, 6.25 mg, Oral, BID WC, Dahal, Binaya, MD, 6.25 mg at 08/30/21 0901   cholecalciferol (VITAMIN D3) tablet 4,000 Units, 4,000 Units, Oral, Daily, Heloise Purpura, RPH, 4,000 Units at 08/30/21 5830   citalopram (CELEXA) tablet 20 mg, 20 mg, Oral, Daily, Wynetta Fines T, MD, 20 mg at 08/30/21 0903   enoxaparin (LOVENOX) injection 40 mg, 40 mg, Subcutaneous, Q24H, Wynetta Fines T, MD, 40 mg at 74/60/02 9847   folic acid (FOLVITE) tablet 1 mg, 1 mg, Oral, Daily, Wynetta Fines T, MD, 1 mg at 08/30/21 3085   hydrALAZINE (APRESOLINE) injection 10 mg, 10 mg, Intravenous, Q6H PRN, Dahal, Binaya, MD   labetalol (NORMODYNE) injection 10 mg, 10 mg, Intravenous, Q6H PRN, Wynetta Fines T, MD   LORazepam (ATIVAN) injection 1 mg, 1 mg, Intravenous, Q6H PRN, Dahal, Marlowe Aschoff, MD, 1 mg at 08/29/21 1949   metoprolol tartrate (LOPRESSOR) injection 5 mg, 5 mg, Intravenous, Q4H PRN, Howerter, Justin B, DO   multivitamin with minerals tablet 1 tablet, 1 tablet, Oral, Daily, Wynetta Fines T, MD, 1 tablet at 08/30/21 0903   OLANZapine (ZYPREXA) injection 5 mg, 5 mg,  Intramuscular, Once, Howerter, Justin B, DO   senna-docusate (Senokot-S) tablet 1 tablet, 1 tablet, Oral, QHS PRN, Lequita Halt, MD   [COMPLETED] thiamine 578m in normal saline (562m IVPB, 500 mg, Intravenous, Q8H, Stopped at 08/29/21 1021 **FOLLOWED BY** thiamine (B-1) 250 mg in sodium chloride 0.9 % 50 mL IVPB, 250 mg, Intravenous, Daily, Last Rate: 100 mL/hr at 08/30/21 0912, 250 mg at 08/30/21 0912 **FOLLOWED BY** [START ON 09/03/2021] thiamine (B-1) injection 100 mg, 100 mg, Intravenous, Daily, Kirby-Graham, KaKarsten FellsNP   vitamin B-12 (CYANOCOBALAMIN) tablet 2,000 mcg, 2,000 mcg, Oral, Daily, WoHeloise PurpuraRPH, 2,000 mcg at 08/30/21 [id number] Current Diet:  Diet Order  Diet general           Diet regular Room service appropriate? No; Fluid consistency: Thin  Diet effective now                  Precautions / Restrictions Precautions Precautions: Fall Precaution Comments: L neglect Restrictions Weight Bearing Restrictions: No Other Position/Activity Restrictions: Pt requires some significant cueing and direction of attention to get LLE to move in requested ways   Has the patient had 2 or more falls or a fall with injury in the past year? Yes  Prior Activity Level Community (5-7x/wk): Independent prior to fall  Prior Functional Level Self Care: Did the patient need help bathing, dressing, using the toilet or eating? Independent  Indoor Mobility: Did the patient need assistance with walking from room to room (with or without device)? Independent  Stairs: Did the patient need assistance with internal or external stairs (with or without device)? Independent  Functional Cognition: Did the patient need help planning regular tasks such as shopping or remembering to take medications? Independent  Patient Information Are you of Hispanic, Latino/a,or Spanish origin?: A. No, not of Hispanic, Latino/a, or Spanish origin What is your race?: A. White Do you  need or want an interpreter to communicate with a doctor or health care staff?: 0. No  Patient's Response To:  Health Literacy and Transportation Is the patient able to respond to health literacy and transportation needs?: Yes Health Literacy - How often do you need to have someone help you when you read instructions, pamphlets, or other written material from your doctor or pharmacy?: Never In the past 12 months, has lack of transportation kept you from medical appointments or from getting medications?: No In the past 12 months, has lack of transportation kept you from meetings, work, or from getting things needed for daily living?: No  Development worker, international aid / [place] Devices/Equipment: Wellsite geologist, Wheelchair, Environmental consultant (specify type), Grab bars in shower Home Equipment: Shower seat, Radio producer - single point, Grab bars - toilet, Grab bars - tub/shower, Conservation officer, nature (2 wheels)  Prior Device Use: Indicate devices/aids used by the patient prior to current illness, exacerbation or injury? None of the above  Current Functional Level Cognition  Arousal/Alertness: Awake/alert Overall Cognitive Status: Impaired/Different from baseline Current Attention Level: Selective Orientation Level: Oriented X4 Following Commands: Follows one step commands with increased time Safety/Judgement: Decreased awareness of deficits General Comments: pt is struggling to replicate L LE movement, possibly motivation but not clear if abiltiy Attention: Sustained Sustained Attention: Impaired Sustained Attention Impairment: Verbal basic, Functional basic Memory: Impaired Memory Impairment: Retrieval deficit, Decreased recall of new information, Decreased short term memory Decreased Short Term Memory: Verbal basic, Functional basic Awareness: Impaired Awareness Impairment: Emergent impairment, Intellectual impairment Problem Solving: Impaired Problem Solving Impairment: Verbal basic, Functional  basic Executive Function: Reasoning, Organizing, Decision Making Reasoning: Impaired Reasoning Impairment: Verbal basic, Functional basic Organizing: Impaired Organizing Impairment: Verbal basic, Functional basic Decision Making: Impaired Decision Making Impairment: Verbal basic, Functional basic Behaviors: Perseveration Safety/Judgment: Impaired    Extremity Assessment (includes Sensation/Coordination)  Upper Extremity Assessment: Defer to OT evaluation LUE Deficits / Details: moving LUE however unaware of location of LUE; appeaers to demosntrate motor sensory deficits/apraxic; unaware she was holding her gown in her L hand LUE Sensation: decreased light touch, decreased proprioception LUE Coordination: decreased fine motor, decreased gross motor  Lower Extremity Assessment: LLE deficits/detail LLE Deficits / Details: Difficulty initiating movement and coordinating steps. Cognition limiting MMT but grossly at  least 4-/5    ADLs  Overall ADL's : Needs assistance/impaired Eating/Feeding: NPO Grooming: Maximal assistance Upper Body Bathing: Maximal assistance Upper Body Dressing : Maximal assistance Lower Body Dressing: Maximal assistance Toilet Transfer: Maximal assistance, +2 for physical assistance Toileting- Clothing Manipulation and Hygiene: Maximal assistance Functional mobility during ADLs: Maximal assistance, +2 for physical assistance    Mobility  Overal bed mobility: Needs Assistance Bed Mobility: Supine to Sit Supine to sit: Min guard General bed mobility comments: Increased time. Close guard for safety.    Transfers  Overall transfer level: Needs assistance Equipment used: 1 person hand held assist Transfers: Sit to/from Stand Sit to Stand: Min assist Bed to/from chair/wheelchair/BSC transfer type:: Step pivot Stand pivot transfers: Max assist, +2 physical assistance Step pivot transfers: Max assist, +2 physical assistance General transfer comment: Assist for  balance support and safety. Poor understanding of Purewick attached to her (Purewick in briefs) and moving without regard to lines. Required specific cues to stop so I could unhook the Purewick from the tubing.    Ambulation / Gait / Stairs / Wheelchair Mobility  Ambulation/Gait Ambulation/Gait assistance: Mod assist Gait Distance (Feet): 200 Feet Assistive device: 1 person hand held assist Gait Pattern/deviations: Step-through pattern, Decreased stride length, Staggering left, Drifts right/left, Trunk flexed General Gait Details: L inattention, heavy drifting to the L with frequent staggering to the L. Pt required almost constant assist (up to heavy mod assist) to prevent falls. Gait velocity: Decreased Gait velocity interpretation: 1.31 - 2.62 ft/sec, indicative of limited community ambulator    Posture / Balance Balance Overall balance assessment: Needs assistance Sitting-balance support: Bilateral upper extremity supported, Feet supported Sitting balance-Leahy Scale: Fair Standing balance support: Bilateral upper extremity supported, During functional activity Standing balance-Leahy Scale: Poor Standardized Balance Assessment Standardized Balance Assessment : Dynamic Gait Index Dynamic Gait Index Level Surface: Moderate Impairment Change in Gait Speed: Moderate Impairment Gait with Horizontal Head Turns: Severe Impairment Gait with Vertical Head Turns: Severe Impairment Gait and Pivot Turn: Severe Impairment Step Over Obstacle: Severe Impairment Step Around Obstacles: Severe Impairment Steps: Moderate Impairment Total Score: 3    Special needs/care consideration Fall precautions   Previous Home Environment  Living Arrangements: Alone  Lives With: Alone Available Help at Discharge: Family, Available 24 hours/day (sister can provide assist in her home or they will hire caregiver in pt's home) Type of Home: [place]: One level Home Access: Level entry Bathroom  Shower/Tub: Multimedia programmer: Handicapped height Bathroom Accessibility: Yes How Accessible: Accessible via walker, Accessible via wheelchair [place]: No  Discharge [place] for Discharge Living Setting: Patient's home, Alone Type of Home at Discharge: [place]: One level Discharge Home Access: Level entry Discharge Bathroom Shower/Tub: Walk-in shower Discharge [place]: Handicapped height Discharge Bathroom Accessibility: Yes How Accessible: Accessible via walker Does the patient have any problems obtaining your medications?: No  Social/Family/Support Systems Contact Information: sister, [name] Anticipated Caregiver: [name] or hired caregivers Anticipated Ambulance person Information: [phone] Ability/Limitations of Caregiver: none Caregiver Availability: 24/7 (or hired caregivers) Discharge Plan Discussed with Primary Caregiver: Yes Is Caregiver In Agreement with Plan?: Yes Does Caregiver/Family have Issues with Lodging/Transportation while Pt is in Rehab?: No  Goals Patient/Family Goal for Rehab: Mod I to supervision with PT, OT and SLP Expected length of stay: ELOS 7 to 10 days Pt/Family Agrees to Admission and willing to participate: Yes Program Orientation Provided & Reviewed with Pt/Caregiver Including Roles  & Responsibilities: Yes  Decrease burden  of Care through IP rehab admission: n/a  Possible need for SNF placement upon discharge: not anticipated  Patient Condition: I have reviewed medical records from [facility], spoken with CM, and patient and family member. I met with patient at the bedside for inpatient rehabilitation assessment.  Patient will benefit from ongoing PT, OT, and SLP, can actively participate in 3 hours of therapy a day 5 days of the week, and can make measurable gains during the admission.  Patient will also benefit from the coordinated team approach during an Inpatient  Acute Rehabilitation admission.  The patient will receive intensive therapy as well as Rehabilitation physician, nursing, social worker, and care management interventions.  Due to bladder management, bowel management, safety, skin/wound care, disease management, medication administration, pain management, and patient education l patient requires 24 hour a day rehabilitation nursing.  The patient is currently min to mod assist with mobility and basic ADLs.  Discharge setting and therapy post discharge at home with outpatient is anticipated.  Patient has agreed to participate in the Acute Inpatient Rehabilitation Program and will admit today.  Preadmission Screen Completed By:  Cleatrice Burke, 08/30/2021 12:47 PM ______________________________________________________________________   Discussed status with Dr. Naaman Plummer on  08/30/2021 at 1302 and received approval for admission today.  Admission Coordinator:  Cleatrice Burke, RN, time 1302 Date 08/30/2021   Assessment/Plan: Diagnosis: metabolic encephalopathy and post concussion syndrome Does the need for close, 24 hr/day Medical supervision in concert with the patient's rehab needs make it unreasonable for this patient to be served in a less intensive setting? Yes Co-Morbidities requiring supervision/potential complications: migraine mgt, hx SCLC s/p ctx, chronic LBP, anxiety disorder, hx of falls Due to bladder management, bowel management, safety, skin/wound care, disease management, medication administration, pain management, and patient education, does the patient require 24 hr/day rehab nursing? Yes Does the patient require coordinated care of a physician, rehab nurse, PT, OT, and SLP to address physical and functional deficits in the context of the above medical diagnosis(es)? Yes Addressing deficits in the following areas: balance, endurance, locomotion, strength, transferring, bowel/bladder control, bathing, dressing, feeding, grooming,  toileting, and psychosocial support Can the patient actively participate in an intensive therapy program of at least 3 hrs of therapy 5 days a week? Yes The potential for patient to make measurable gains while on inpatient rehab is excellent Anticipated functional outcomes upon discharge from inpatient rehab: modified independent and supervision PT, modified independent and supervision OT, modified independent and supervision SLP Estimated rehab length of stay to reach the above functional goals is: 7 days Anticipated discharge destination: Home 10. Overall Rehab/Functional Prognosis: excellent   MD Signature: Meredith Staggers, MD, [place] Director Rehabilitation Services 08/30/2021

## 2021-08-30 NOTE — TOC Transition Note (Signed)
Transition of Care [facility]) - CM/SW Discharge Note   Patient Details  Name: KEERAT DENICOLA MRN: [id number] Date of Birth: [birth year]  [facility]) CM/SW Contact:  Pollie Friar, RN Phone Number: 08/30/2021, 11:38 AM   Clinical Narrative:    Patient is discharging to CIR today. CM signing off.    Final next level of care: IP Rehab Facility Barriers to Discharge: No Barriers Identified   Patient Goals and CMS Choice   CMS Medicare.gov Compare Post Acute Care list provided to:: Patient Represenative (must comment) Choice offered to / list presented to : Patient, Sibling  Discharge Placement                       Discharge Plan and Services   Discharge Planning Services: CM Consult Post Acute Care Choice: IP Rehab                               Social Determinants of Health (SDOH) Interventions     Readmission Risk Interventions Readmission Risk Prevention Plan 08/01/2020  Post Dischage Appt Complete  Medication Screening Complete  Transportation Screening Complete  Some recent data might be hidden

## 2021-08-30 NOTE — Progress Notes (Signed)
Inpatient Rehabilitation  Patient information reviewed and entered into eRehab system by Sergi Gellner M. Sulay Brymer, M.A., CCC/SLP, PPS Coordinator.  Information including medical coding, functional ability and quality indicators will be reviewed and updated through discharge.    

## 2021-08-30 NOTE — Progress Notes (Signed)
PROGRESS NOTE  [name] [name]  DOB: 1950-10-03  PCP: Colon Branch, MD ZWC:[id number]  DOA: 08/25/2021  LOS: 5 days  Hospital Day: 6  Chief Complaint  Patient presents with   Altered Mental Status   Brief narrative: [name] [name] is a 71 y.o. female with PMH significant for HTN, HLD, ocular migraine, non-small cell lung cancer in 2013 status post lobectomy, chemo and prophylactic cranial irradiation, noncompliance to medications who lives by self and gets regular stops by her sister. Patient presented to the ED on 08/25/2021 with altered mental status, and new left-sided weakness causing gait impairment.  Patient has chronic dysphagia which he states is because of ' big uvula'. Patient also reported that she has ongoing headache for 3 weeks after a fall slamming her face into the ground.  In the ED, patient had a temperature of 100, heart rate elevated persistently to over 110, blood pressure in 150s, breathing on room air CT scan of head did not show any acute intracranial abnormality MRI brain showed cerebral atrophy but did not show any acute reversible finding, no evidence of metastatic disease. CTA head and neck did not show any large vessel occlusion or hemodynamically significant stenosis. Admitted to hospitalist service.. Neurology consulted. Currently pending CIR.  Subjective: Patient was seen and examined this morning. Lying on bed.  Not in distress.  No new symptoms. She seems more agreeable today to try rehab. Complains of intermittent headache.  Reminded her to ask for as needed butalbital that was ordered 4 days ago.  Assessment/Plan: Fall leading to neurological symptoms -Patient reportedly had a fall 3 weeks ago slamming her face to the ground.  She started having headache, intermittent confusion, gait disturbance since then.  In the ED, she was noted to have left-sided weakness and also a concern of left-sided neglect. -Imagings negative for stroke or  any other injury related to fall. -Neurology consult appreciated. -Unclear etiology of her symptoms at this time.  Could be related to concussion. -Completed EEG monitoring.  Per neurology, EEG did not show any seizure definite epileptiform discharges but suggestive of cortical dysfunction arising from right frontotemporal region likely secondary to underlying structural abnormality, postictal state. -Headache intermittent.  Butalbital as needed.  Acute metabolic encephalopathy -Patient has intermittent confusion.  She is oriented to place and person but not to time.  Imagings and labs unremarkable.  Ongoing long-term EEG. -Continue reorientation efforts.   Chronic dysphagia -Patient states that she has chronic dysphagia.  She was told in the past by her dentist that she has a large uvula.  Patient denies any history of esophageal stretching. -Seen by speech therapy.  Regular diet started.  Hypertension, sinus tachycardia -Noncompliant to medications at home.   -Currently on oral Coreg at 6.25 mg twice daily. -IV hydralazine as needed.    HLD -HDL 67, LDL 98.   History of non-small cell lung CA -non-small cell lung cancer in 2013 status post lobectomy, chemo and prophylactic cranial irradiation -Annual chest CT and follow-up with oncology.    Vitamin B12 deficiency -Continue vitamin B12 supplement.   Mobility: PT evaluation obtained.  CIR recommended. Living condition: Lives at home alone Goals of care:   Code Status: Full Code  Nutritional status: Body mass index is 28.87 kg/m.      Diet:  Diet Order             Diet regular Room service appropriate? No; Fluid consistency: Thin  Diet effective now  DVT prophylaxis:  enoxaparin (LOVENOX) injection 40 mg Start: 08/25/21 1830   Antimicrobials: None Fluid: Not on IV fluid Consultants: Neurology Family Communication: None at bedside  Status is: Inpatient  Continue in-hospital care because  -pending CIR.  Agreeable Level of care: Telemetry Medical   Dispo: The patient is from: Home              Anticipated d/c is to: CIR              patient currently is medically stable to d/c.   Difficult to place patient No     Infusions:   sodium chloride 100 mL/hr (08/28/21 0924)   thiamine injection 250 mg (08/30/21 0912)    Scheduled Meds:   stroke: mapping our early stages of recovery book   Does not apply Once   aspirin EC  81 mg Oral Daily   carvedilol  6.25 mg Oral BID WC   cholecalciferol  4,000 Units Oral Daily   citalopram  20 mg Oral Daily   enoxaparin (LOVENOX) injection  40 mg Subcutaneous A07M   folic acid  1 mg Oral Daily   multivitamin with minerals  1 tablet Oral Daily   OLANZapine  5 mg Intramuscular Once   [START ON 09/03/2021] thiamine injection  100 mg Intravenous Daily   vitamin B-12  2,000 mcg Oral Daily    PRN meds: acetaminophen **OR** acetaminophen (TYLENOL) oral liquid 160 mg/5 mL **OR** acetaminophen, acetaminophen, butalbital-acetaminophen-caffeine, hydrALAZINE, labetalol, LORazepam, metoprolol tartrate, senna-docusate   Antimicrobials: Anti-infectives (From admission, onward)    None       Objective: Vitals:   08/29/21 2339 08/30/21 0833  BP: 113/78 130/83  Pulse: 87 89  Resp: 14 18  Temp: 98.3 F (36.8 C) 98.8 F (37.1 C)  SpO2: 97% 98%    Intake/Output Summary (Last 24 hours) at 08/30/2021 1014 Last data filed at 08/30/2021 0529 Gross per 24 hour  Intake 240 ml  Output 550 ml  Net -310 ml    Filed Weights   08/29/21 1123  Weight: 71.6 kg   Weight change:  Body mass index is 28.87 kg/m.   Physical Exam: General exam: Pleasant, elderly Caucasian female.  Not in physical distress Skin: No rashes, lesions or ulcers. HEENT: Atraumatic, normocephalic, no obvious bleeding Lungs: Clear to auscultation bilaterally CVS: Regular rate and rhythm, no murmur GI/Abd soft, nontender, nondistended, bowel sound present CNS: Alert,  awake, oriented to place and person, to time with difficulty. Not restless or agitated. Psychiatry: Mood appropriate Extremities: No pedal edema, no calf tenderness  Data Review: I have personally reviewed the laboratory data and studies available.  F/u labs ordered Unresulted Labs (From admission, onward)    None       Signed, Terrilee Croak, MD [facility] 08/30/2021

## 2021-08-30 NOTE — Progress Notes (Signed)
[name] Staggers, MD  Physician Physical Medicine and Rehabilitation PMR Pre-admission     Signed Date of Service:  08/30/2021 12:47 PM  Related encounter: ED to Hosp-Admission (Discharged) from 08/25/2021 in [facility]   Signed      Show:Clear all [x] Written[x] Templated[x] Copied  Added by: [x] Cristina Gong, RN[x] [name] Staggers, MD  [] Hover for details                                                                                                                                                                                                                                                                                                                                                                PMR Admission Coordinator Pre-Admission Assessment   Patient: [name] [name] is an 71 y.o., female MRN: [id number] DOB: 09-Sep-1950 Height: 5\' 2"  (157.5 cm) Weight: 71.6 kg   Insurance Information HMO:     PPO:      PCP:      IPA:      80/20:      OTHER:  PRIMARY: Medicare a and b      Policy#: [id number]      Subscriber: pt Benefits:  Phone #: passport one source online     Name: 1/6 Eff. Date: 01/23/2018     Deduct: $1600      Out of Pocket Max: none      Life Max: none CIR: 100%      SNF: 20 full days Outpatient: 80%     Co-Pay: 20% Home Health: 100%      Co-Pay: none DME: 80%     Co-Pay: 20% Providers: pt choice  SECONDARY: [employer] medicare supplement      Policy#: [id number]   Financial Counselor:  Phone#:    The Data Collection Information Summary for patients in Inpatient Rehabilitation Facilities with attached Privacy Act [place] Records was provided and verbally reviewed with: Patient and Family   Emergency Contact Information Contact Information       Name Relation Home Work [name] Sister [phone]   [phone]         Current Medical History  Patient Admitting Diagnosis: Debility, metabolic encephalopathy   History of Present Illness:  71 year old female with history of SCLC s/p chemo/XRT and prophylactic cranial XRT, B12 deficiency chronic LBP, anxiety d/o, pelvic Fx with CIR  07/2020 who was admitted on 08/25/21 with headaches, mild confusion, left sided weakness and right gaze preference. Per reports, patient had fall 3 weeks PTA when she struck her head with onset of persistent headaches. CTA/P head was negative for LVO or significant stenosis. MRI brain without acute changes or metastatic disease. EEG was showed nonspecific cortical dysfunction in right frontoparietal region and negative for seizures or epileptiform discharge. 2D echo showed EF 65-70% and mild concentric LVH. Per Neurology EEG did not show any seizure definite epileptiform discharges but suggestive of cortical dysfunction arising from frontotemporal region likely secondary to underlying structural abnormality, postictal state. Patient reports chronic dysphagia. Regular diet and follow up by SLP. Noncompliant to HTN meds at home. On oral Coreg.     Complete NIHSS TOTAL: 2   Patient's medical record from [facility] has been reviewed by the rehabilitation admission coordinator and physician.   Past Medical History      Past Medical History:  Diagnosis Date   Anemia      duering cancer treatment    Anxiety     Arthritis      OA hands    Bilateral lower extremity pain     Blood transfusion without reported diagnosis      during chemo for lung cancer   Cataract      forming  but very small    Cholelithiasis     Colon polyps      adenomatous   High cholesterol     History of chemotherapy      for lung cancer- completed 2013   Hx of radiation therapy 10/07/11 to 11/20/11    right lung   Hx of radiation therapy 01/07/2012- 01/21/12    cranial irradiation   Hypertension      Lung cancer ([place]) 09/19/11    Stage III currently in remission   Malignant melanoma of skin of canthus of right eye ([place]) 02/2017   Osteoporosis 04/2014    Dexa scan by Dr. Corinna Capra, T-score -2.7, next in 2 years, with at least 13 % major fracture in 10 years   Pitting edema      Bilateral lower extremities, duplex ultrasound 04/03/2015 normal, no DVT    Has the patient had major surgery during 100 days prior to admission? No   Family History   family history includes Atrial fibrillation in her mother; Bladder Cancer in her father; Clotting disorder in her mother; Colon cancer (age of onset: 24) in her maternal aunt; Colon polyps in her father and mother; Drug abuse in her son; Heart disease in her mother; Stroke in her father.   Current Medications   Current Facility-Administered Medications:     stroke: mapping our early stages of recovery book, , Does not apply, Once, Wynetta Fines T, MD   [COMPLETED] sodium chloride 0.9 % bolus 500 mL, 500 mL,  Intravenous, Once, Stopped at 08/25/21 1704 **FOLLOWED BY** 0.9 %  sodium chloride infusion, 100 mL/hr, Intravenous, Continuous, Dorie Rank, MD, Last Rate: 100 mL/hr at 08/28/21 0924, Infusion Verify at 08/28/21 0924   acetaminophen (TYLENOL) tablet 650 mg, 650 mg, Oral, Q4H PRN **OR** acetaminophen (TYLENOL) 160 MG/5ML solution 650 mg, 650 mg, Per Tube, Q4H PRN, 650 mg at 08/26/21 1136 **OR** acetaminophen (TYLENOL) suppository 650 mg, 650 mg, Rectal, Q4H PRN, Lequita Halt, MD   acetaminophen (TYLENOL) tablet 650 mg, 650 mg, Oral, Q6H PRN, Lequita Halt, MD   aspirin EC tablet 81 mg, 81 mg, Oral, Daily, Wynetta Fines T, MD, 81 mg at 08/30/21 7096   butalbital-acetaminophen-caffeine (FIORICET) 50-325-40 MG per tablet 1 tablet, 1 tablet, Oral, Q6H PRN, Terrilee Croak, MD, 1 tablet at 08/29/21 1948   carvedilol (COREG) tablet 6.25 mg, 6.25 mg, Oral, BID WC, Dahal, Binaya, MD, 6.25 mg at 08/30/21 0901   cholecalciferol (VITAMIN D3) tablet 4,000 Units, 4,000  Units, Oral, Daily, Heloise Purpura, RPH, 4,000 Units at 08/30/21 2836   citalopram (CELEXA) tablet 20 mg, 20 mg, Oral, Daily, Wynetta Fines T, MD, 20 mg at 08/30/21 0903   enoxaparin (LOVENOX) injection 40 mg, 40 mg, Subcutaneous, Q24H, Wynetta Fines T, MD, 40 mg at 62/94/76 5465   folic acid (FOLVITE) tablet 1 mg, 1 mg, Oral, Daily, Wynetta Fines T, MD, 1 mg at 08/30/21 0354   hydrALAZINE (APRESOLINE) injection 10 mg, 10 mg, Intravenous, Q6H PRN, Dahal, Binaya, MD   labetalol (NORMODYNE) injection 10 mg, 10 mg, Intravenous, Q6H PRN, Wynetta Fines T, MD   LORazepam (ATIVAN) injection 1 mg, 1 mg, Intravenous, Q6H PRN, Dahal, Marlowe Aschoff, MD, 1 mg at 08/29/21 1949   metoprolol tartrate (LOPRESSOR) injection 5 mg, 5 mg, Intravenous, Q4H PRN, Howerter, Justin B, DO   multivitamin with minerals tablet 1 tablet, 1 tablet, Oral, Daily, Wynetta Fines T, MD, 1 tablet at 08/30/21 0903   OLANZapine (ZYPREXA) injection 5 mg, 5 mg, Intramuscular, Once, Howerter, Justin B, DO   senna-docusate (Senokot-S) tablet 1 tablet, 1 tablet, Oral, QHS PRN, Lequita Halt, MD   [COMPLETED] thiamine 500mg  in normal saline (45ml) IVPB, 500 mg, Intravenous, Q8H, Stopped at 08/29/21 1021 **FOLLOWED BY** thiamine (B-1) 250 mg in sodium chloride 0.9 % 50 mL IVPB, 250 mg, Intravenous, Daily, Last Rate: 100 mL/hr at 08/30/21 0912, 250 mg at 08/30/21 0912 **FOLLOWED BY** [START ON 09/03/2021] thiamine (B-1) injection 100 mg, 100 mg, Intravenous, Daily, Kirby-Graham, Karsten Fells, NP   vitamin B-12 (CYANOCOBALAMIN) tablet 2,000 mcg, 2,000 mcg, Oral, Daily, Heloise Purpura, RPH, 2,000 mcg at 08/30/21 6568   Patients Current Diet:  Diet Order                  Diet general             Diet regular Room service appropriate? No; Fluid consistency: Thin  Diet effective now                       Precautions / Restrictions Precautions Precautions: Fall Precaution Comments: L neglect Restrictions Weight Bearing Restrictions: No Other  Position/Activity Restrictions: Pt requires some significant cueing and direction of attention to get LLE to move in requested ways    Has the patient had 2 or more falls or a fall with injury in the past year? Yes   Prior Activity Level Community (5-7x/wk): Independent prior to fall   Prior Functional Level Self Care: Did the patient  need help bathing, dressing, using the toilet or eating? Independent   Indoor Mobility: Did the patient need assistance with walking from room to room (with or without device)? Independent   Stairs: Did the patient need assistance with internal or external stairs (with or without device)? Independent   Functional Cognition: Did the patient need help planning regular tasks such as shopping or remembering to take medications? Independent   Patient Information Are you of Hispanic, Latino/a,or Spanish origin?: A. No, not of Hispanic, Latino/a, or Spanish origin What is your race?: A. White Do you need or want an interpreter to communicate with a doctor or health care staff?: 0. No   Patient's Response To:  Health Literacy and Transportation Is the patient able to respond to health literacy and transportation needs?: Yes Health Literacy - How often do you need to have someone help you when you read instructions, pamphlets, or other written material from your doctor or pharmacy?: Never In the past 12 months, has lack of transportation kept you from medical appointments or from getting medications?: No In the past 12 months, has lack of transportation kept you from meetings, work, or from getting things needed for daily living?: No   Development worker, international aid / [place] Devices/Equipment: Wellsite geologist, Wheelchair, Environmental consultant (specify type), Grab bars in shower Home Equipment: Shower seat, Radio producer - single point, Grab bars - toilet, Grab bars - tub/shower, Conservation officer, nature (2 wheels)   Prior Device Use: Indicate devices/aids used by the patient prior to  current illness, exacerbation or injury? None of the above   Current Functional Level Cognition   Arousal/Alertness: Awake/alert Overall Cognitive Status: Impaired/Different from baseline Current Attention Level: Selective Orientation Level: Oriented X4 Following Commands: Follows one step commands with increased time Safety/Judgement: Decreased awareness of deficits General Comments: pt is struggling to replicate L LE movement, possibly motivation but not clear if abiltiy Attention: Sustained Sustained Attention: Impaired Sustained Attention Impairment: Verbal basic, Functional basic Memory: Impaired Memory Impairment: Retrieval deficit, Decreased recall of new information, Decreased short term memory Decreased Short Term Memory: Verbal basic, Functional basic Awareness: Impaired Awareness Impairment: Emergent impairment, Intellectual impairment Problem Solving: Impaired Problem Solving Impairment: Verbal basic, Functional basic Executive Function: Reasoning, Organizing, Decision Making Reasoning: Impaired Reasoning Impairment: Verbal basic, Functional basic Organizing: Impaired Organizing Impairment: Verbal basic, Functional basic Decision Making: Impaired Decision Making Impairment: Verbal basic, Functional basic Behaviors: Perseveration Safety/Judgment: Impaired    Extremity Assessment (includes Sensation/Coordination)   Upper Extremity Assessment: Defer to OT evaluation LUE Deficits / Details: moving LUE however unaware of location of LUE; appeaers to demosntrate motor sensory deficits/apraxic; unaware she was holding her gown in her L hand LUE Sensation: decreased light touch, decreased proprioception LUE Coordination: decreased fine motor, decreased gross motor  Lower Extremity Assessment: LLE deficits/detail LLE Deficits / Details: Difficulty initiating movement and coordinating steps. Cognition limiting MMT but grossly at least 4-/5     ADLs   Overall ADL's : Needs  assistance/impaired Eating/Feeding: NPO Grooming: Maximal assistance Upper Body Bathing: Maximal assistance Upper Body Dressing : Maximal assistance Lower Body Dressing: Maximal assistance Toilet Transfer: Maximal assistance, +2 for physical assistance Toileting- Clothing Manipulation and Hygiene: Maximal assistance Functional mobility during ADLs: Maximal assistance, +2 for physical assistance     Mobility   Overal bed mobility: Needs Assistance Bed Mobility: Supine to Sit Supine to sit: Min guard General bed mobility comments: Increased time. Close guard for safety.     Transfers   Overall transfer level: Needs assistance Equipment used:  1 person hand held assist Transfers: Sit to/from Stand Sit to Stand: Min assist Bed to/from chair/wheelchair/BSC transfer type:: Step pivot Stand pivot transfers: Max assist, +2 physical assistance Step pivot transfers: Max assist, +2 physical assistance General transfer comment: Assist for balance support and safety. Poor understanding of Purewick attached to her (Purewick in briefs) and moving without regard to lines. Required specific cues to stop so I could unhook the Purewick from the tubing.     Ambulation / Gait / Stairs / Wheelchair Mobility   Ambulation/Gait Ambulation/Gait assistance: Mod assist Gait Distance (Feet): 200 Feet Assistive device: 1 person hand held assist Gait Pattern/deviations: Step-through pattern, Decreased stride length, Staggering left, Drifts right/left, Trunk flexed General Gait Details: L inattention, heavy drifting to the L with frequent staggering to the L. Pt required almost constant assist (up to heavy mod assist) to prevent falls. Gait velocity: Decreased Gait velocity interpretation: 1.31 - 2.62 ft/sec, indicative of limited community ambulator     Posture / Balance Balance Overall balance assessment: Needs assistance Sitting-balance support: Bilateral upper extremity supported, Feet supported Sitting  balance-Leahy Scale: Fair Standing balance support: Bilateral upper extremity supported, During functional activity Standing balance-Leahy Scale: Poor Standardized Balance Assessment Standardized Balance Assessment : Dynamic Gait Index Dynamic Gait Index Level Surface: Moderate Impairment Change in Gait Speed: Moderate Impairment Gait with Horizontal Head Turns: Severe Impairment Gait with Vertical Head Turns: Severe Impairment Gait and Pivot Turn: Severe Impairment Step Over Obstacle: Severe Impairment Step Around Obstacles: Severe Impairment Steps: Moderate Impairment Total Score: 3     Special needs/care consideration Fall precautions    Previous Home Environment  Living Arrangements: Alone  Lives With: Alone Available Help at Discharge: Family, Available 24 hours/day (sister can provide assist in her home or they will hire caregiver in pt's home) Type of Home: [place]: One level Home Access: Level entry Bathroom Shower/Tub: Multimedia programmer: Handicapped height Bathroom Accessibility: Yes How Accessible: Accessible via walker, Accessible via wheelchair [place]: No   Discharge [place] for Discharge Living Setting: Patient's home, Alone Type of Home at Discharge: [place]: One level Discharge Home Access: Level entry Discharge Bathroom Shower/Tub: Walk-in shower Discharge [place]: Handicapped height Discharge Bathroom Accessibility: Yes How Accessible: Accessible via walker Does the patient have any problems obtaining your medications?: No   Social/Family/Support Systems Contact Information: sister, [name] Anticipated Caregiver: [name] or hired caregivers Anticipated Ambulance person Information: [phone] Ability/Limitations of Caregiver: none Caregiver Availability: 24/7 (or hired caregivers) Discharge Plan Discussed with Primary Caregiver: Yes Is Caregiver In Agreement with Plan?:  Yes Does Caregiver/Family have Issues with Lodging/Transportation while Pt is in Rehab?: No   Goals Patient/Family Goal for Rehab: Mod I to supervision with PT, OT and SLP Expected length of stay: ELOS 7 to 10 days Pt/Family Agrees to Admission and willing to participate: Yes Program Orientation Provided & Reviewed with Pt/Caregiver Including Roles  & Responsibilities: Yes   Decrease burden of Care through IP rehab admission: n/a   Possible need for SNF placement upon discharge: not anticipated   Patient Condition: I have reviewed medical records from [facility], spoken with CM, and patient and family member. I met with patient at the bedside for inpatient rehabilitation assessment.  Patient will benefit from ongoing PT, OT, and SLP, can actively participate in 3 hours of therapy a day 5 days of the week, and can make measurable gains during the admission.  Patient will also benefit from the coordinated  team approach during an Inpatient Acute Rehabilitation admission.  The patient will receive intensive therapy as well as Rehabilitation physician, nursing, social worker, and care management interventions.  Due to bladder management, bowel management, safety, skin/wound care, disease management, medication administration, pain management, and patient education l patient requires 24 hour a day rehabilitation nursing.  The patient is currently min to mod assist with mobility and basic ADLs.  Discharge setting and therapy post discharge at home with outpatient is anticipated.  Patient has agreed to participate in the Acute Inpatient Rehabilitation Program and will admit today.   Preadmission Screen Completed By:  Cleatrice Burke, 08/30/2021 12:47 PM ______________________________________________________________________   Discussed status with Dr. Naaman Plummer on  08/30/2021 at 1302 and received approval for admission today.   Admission Coordinator:  Cleatrice Burke, RN, time 1302 Date  08/30/2021    Assessment/Plan: Diagnosis: metabolic encephalopathy and post concussion syndrome Does the need for close, 24 hr/day Medical supervision in concert with the patient's rehab needs make it unreasonable for this patient to be served in a less intensive setting? Yes Co-Morbidities requiring supervision/potential complications: migraine mgt, hx SCLC s/p ctx, chronic LBP, anxiety disorder, hx of falls Due to bladder management, bowel management, safety, skin/wound care, disease management, medication administration, pain management, and patient education, does the patient require 24 hr/day rehab nursing? Yes Does the patient require coordinated care of a physician, rehab nurse, PT, OT, and SLP to address physical and functional deficits in the context of the above medical diagnosis(es)? Yes Addressing deficits in the following areas: balance, endurance, locomotion, strength, transferring, bowel/bladder control, bathing, dressing, feeding, grooming, toileting, and psychosocial support Can the patient actively participate in an intensive therapy program of at least 3 hrs of therapy 5 days a week? Yes The potential for patient to make measurable gains while on inpatient rehab is excellent Anticipated functional outcomes upon discharge from inpatient rehab: modified independent and supervision PT, modified independent and supervision OT, modified independent and supervision SLP Estimated rehab length of stay to reach the above functional goals is: 7 days Anticipated discharge destination: Home 10. Overall Rehab/Functional Prognosis: excellent     MD Signature: [name] Staggers, MD, [place] Director Rehabilitation Services 08/30/2021          Revision History                     Note Details  Author [name] Staggers, MD File Time 08/30/2021  1:31 PM  Author Type Physician Status Signed  Last Editor [name] Staggers, MD Service  Physical Medicine and [place] # [id number] Admit Date 08/30/2021

## 2021-08-30 NOTE — Progress Notes (Signed)
Patient admitted from [facility]W. Patient oriented to unit. Patient has a pleasant affect and is eager to begin therapy. No problems noted at time of admission. Policy and procedures explained to patient.    Gladstone Lighter, LPN

## 2021-08-30 NOTE — Discharge Summary (Signed)
Physician Discharge Summary  [name] [name] WSF:[id number] DOB: September 13, 1950 DOA: 08/25/2021  PCP: Colon Branch, MD  Admit date: 08/25/2021 Discharge date: 08/30/2021  Admitted From: Home Discharge disposition: CIR   Code Status: Full Code   Discharge Diagnosis:   Principal Problem:   AMS (altered mental status)    Chief Complaint  Patient presents with   Altered Mental Status   Brief narrative: [name] [name] is a 71 y.o. female with PMH significant for HTN, HLD, ocular migraine, non-small cell lung cancer in 2013 status post lobectomy, chemo and prophylactic cranial irradiation, noncompliance to medications who lives by self and gets regular stops by her sister. Patient presented to the ED on 08/25/2021 with altered mental status, and new left-sided weakness causing gait impairment.  Patient has chronic dysphagia which he states is because of ' big uvula'. Patient also reported that she has ongoing headache for 3 weeks after a fall slamming her face into the ground.  In the ED, patient had a temperature of 100, heart rate elevated persistently to over 110, blood pressure in 150s, breathing on room air CT scan of head did not show any acute intracranial abnormality MRI brain showed cerebral atrophy but did not show any acute reversible finding, no evidence of metastatic disease. CTA head and neck did not show any large vessel occlusion or hemodynamically significant stenosis. Admitted to hospitalist service.. Neurology consulted. Currently pending CIR.  Subjective: Patient was seen and examined this morning. Lying on bed.  Not in distress.  No new symptoms. She seems more agreeable today to try rehab. Complains of intermittent headache.  Reminded her to ask for as needed butalbital that was ordered 4 days ago.  Hospital course: Fall leading to neurological symptoms -Patient reportedly had a fall 3 weeks ago slamming her face to the ground.  She started having  headache, intermittent confusion, gait disturbance since then.  In the ED, she was noted to have left-sided weakness and also a concern of left-sided neglect. -Imagings negative for stroke or any other injury related to fall. -Neurology consult appreciated. -Unclear etiology of her symptoms at this time.  Could be related to concussion. -Completed EEG monitoring.  Per neurology, EEG did not show any seizure definite epileptiform discharges but suggestive of cortical dysfunction arising from right frontotemporal region likely secondary to underlying structural abnormality, postictal state. -Headache intermittent.  Butalbital as needed.  Acute metabolic encephalopathy -Patient has intermittent confusion.  She is oriented to place and person but not to time.  Imagings and labs unremarkable.  Ongoing long-term EEG. -Continue reorientation efforts.   Chronic dysphagia -Patient states that she has chronic dysphagia.  She was told in the past by her dentist that she has a large uvula.  Patient denies any history of esophageal stretching. -Seen by speech therapy.  Regular diet started.  Hypertension, sinus tachycardia -Noncompliant to medications at home.   -Currently on oral Coreg at 6.25 mg twice daily. -IV hydralazine as needed.    HLD -HDL 67, LDL 98.   History of non-small cell lung CA -non-small cell lung cancer in 2013 status post lobectomy, chemo and prophylactic cranial irradiation -Annual chest CT and follow-up with oncology.    Vitamin B12 deficiency -Continue vitamin B12 supplement.   Mobility: PT evaluation obtained.  CIR recommended. Living condition: Lives at home alone Goals of care:   Code Status: Full Code  Nutritional status: Body mass index is 28.87 kg/m.      Discharge Medications:   Allergies  as of 08/30/2021       Reactions   Mosquito (diagnostic)    Tramadol Hcl Nausea And Vomiting   Codeine Nausea And Vomiting   Penicillins Rash        Medication List      STOP taking these medications    acetaminophen 325 MG tablet Commonly known as: TYLENOL   lisinopril 2.5 MG tablet Commonly known as: ZESTRIL   metoprolol tartrate 25 MG tablet Commonly known as: LOPRESSOR       TAKE these medications    aspirin EC 81 MG tablet Take 81 mg daily by mouth.   butalbital-acetaminophen-caffeine 50-325-40 MG tablet Commonly known as: FIORICET Take 1 tablet by mouth every 6 (six) hours as needed for headache.   carvedilol 6.25 MG tablet Commonly known as: COREG Take 1 tablet (6.25 mg total) by mouth 2 (two) times daily with a meal.   citalopram 20 MG tablet Commonly known as: CELEXA Take 1 tablet (20 mg total) by mouth daily.   Cyanocobalamin 2500 MCG Chew Chew 2 tablets by mouth daily.   folic acid 1 MG tablet Commonly known as: FOLVITE Take 1 tablet (1 mg total) by mouth daily. Start taking on: August 31, 2021   Moderna COVID-19 Vaccine 100 MCG/0.5ML injection Generic drug: COVID-19 mRNA vaccine (Moderna) Inject into the muscle.   ondansetron 8 MG disintegrating tablet Commonly known as: ZOFRAN-ODT Take 8 mg by mouth every 8 (eight) hours as needed for vomiting or nausea.   raloxifene 60 MG tablet Commonly known as: EVISTA Take 1 tablet (60 mg total) by mouth daily.   Repatha SureClick 381 MG/ML Soaj Generic drug: Evolocumab INJECT 140 MG( 1 ML) UNDER THE SKIN EVERY 14 DAYS What changed: See the new instructions.   rosuvastatin 40 MG tablet Commonly known as: CRESTOR Take 1 tablet (40 mg total) by mouth at bedtime.   senna-docusate 8.6-50 MG tablet Commonly known as: Senokot-S Take 1 tablet by mouth at bedtime as needed for mild constipation.   Vitamin D3 50 MCG (2000 UT) capsule Take 4,000 Units by mouth daily.        Wound care:     Discharge Instructions:   Discharge Instructions     Ambulatory referral to Neurology   Complete by: As directed    An appointment is requested in approximately: 8 weeks    Call MD for:  difficulty breathing, headache or visual disturbances   Complete by: As directed    Call MD for:  extreme fatigue   Complete by: As directed    Call MD for:  hives   Complete by: As directed    Call MD for:  persistant dizziness or light-headedness   Complete by: As directed    Call MD for:  persistant nausea and vomiting   Complete by: As directed    Call MD for:  severe uncontrolled pain   Complete by: As directed    Call MD for:  temperature >100.4   Complete by: As directed    Diet general   Complete by: As directed    Discharge instructions   Complete by: As directed    General discharge instructions:  Follow with Primary MD Colon Branch, MD in 7 days   Get CBC/BMP checked in next visit within 1 week by PCP or SNF MD. (We routinely change or add medications that can affect your baseline labs and fluid status, therefore we recommend that you get the mentioned basic workup next visit with your PCP,  your PCP may decide not to get them or add new tests based on their clinical decision)  On your next visit with your PCP, please get your medicines reviewed and adjusted.  Please request your PCP  to go over all hospital tests, procedures, radiology results at the follow up, please get all Hospital records sent to your PCP by signing hospital release before you go home.  Activity: As tolerated with Full fall precautions use walker/cane & assistance as needed  Avoid using any recreational substances like cigarette, tobacco, alcohol, or non-prescribed drug.  If you experience worsening of your admission symptoms, develop shortness of breath, life threatening emergency, suicidal or homicidal thoughts you must seek medical attention immediately by calling 911 or calling your MD immediately  if symptoms less severe.  You must read complete instructions/literature along with all the possible adverse reactions/side effects for all the medicines you take and that have been  prescribed to you. Take any new medicine only after you have completely understood and accepted all the possible adverse reactions/side effects.   Do not drive, operate heavy machinery, perform activities at heights, swimming or participation in water activities or provide baby sitting services if your were admitted for syncope or siezures until you have seen by Primary MD or a Neurologist and advised to do so again.  Do not drive when taking Pain medications.  Do not take more than prescribed Pain, Sleep and Anxiety Medications  Wear Seat belts while driving.  Please note You were cared for by a hospitalist during your hospital stay. If you have any questions about your discharge medications or the care you received while you were in the hospital after you are discharged, you can call the unit and asked to speak with the hospitalist on call if the hospitalist that took care of you is not available. Once you are discharged, your primary care physician will handle any further medical issues. Please note that NO REFILLS for any discharge medications will be authorized once you are discharged, as it is imperative that you return to your primary care physician (or establish a relationship with a primary care physician if you do not have one) for your aftercare needs so that they can reassess your need for medications and monitor your lab values.   Increase activity slowly   Complete by: As directed        Follow ups:    Follow-up Martin, MD Follow up.   Specialty: Internal Medicine Contact information: [address] [place] [address] [phone]                 Discharge Exam:   Vitals:   08/29/21 1539 08/29/21 2002 08/29/21 2339 08/30/21 0833  BP: 135/74 (!) 141/87 113/78 130/83  Pulse: 91 88 87 89  Resp: 16 18 14 18   Temp: 97.7 F (36.5 C) 98 F (36.7 C) 98.3 F (36.8 C) 98.8 F (37.1 C)  TempSrc: Oral Oral Oral Oral  SpO2: 100%  99% 97% 98%  Weight:      Height:        Body mass index is 28.87 kg/m.   General exam: Pleasant, elderly Caucasian female.  Not in physical distress Skin: No rashes, lesions or ulcers. HEENT: Atraumatic, normocephalic, no obvious bleeding Lungs: Clear to auscultation bilaterally CVS: Regular rate and rhythm, no murmur GI/Abd soft, nontender, nondistended, bowel sound present CNS: Alert, awake, oriented to place and person, to time  with difficulty. Not restless or agitated. Psychiatry: Mood appropriate Extremities: No pedal edema, no calf tenderness  Time coordinating discharge: 35 minutes   The results of significant diagnostics from this hospitalization (including imaging, microbiology, ancillary and laboratory) are listed below for reference.    Procedures and Diagnostic Studies:   CT HEAD WO CONTRAST  Result Date: 08/25/2021 CLINICAL DATA:  Neuro deficit, acute, stroke suspected EXAM: CT HEAD WITHOUT CONTRAST TECHNIQUE: Contiguous axial images were obtained from the base of the skull through the vertex without intravenous contrast. COMPARISON:  MRI head 09/16/2011 BRAIN: BRAIN Cerebral ventricle sizes are concordant with the degree of cerebral volume loss. No evidence of large-territorial acute infarction. No parenchymal hemorrhage. No mass lesion. No extra-axial collection. No mass effect or midline shift. No hydrocephalus. Basilar cisterns are patent. Vascular: No hyperdense vessel. Skull: No acute fracture or focal lesion. Sinuses/Orbits: Paranasal sinuses and mastoid air cells are clear. The orbits are unremarkable. Other: None. IMPRESSION: No acute intracranial abnormality. Electronically Signed   By: Iven Finn M.D.   On: 08/25/2021 16:47   MR BRAIN W WO CONTRAST  Result Date: 08/26/2021 CLINICAL DATA:  Altered mental status EXAM: MRI HEAD WITHOUT AND WITH CONTRAST TECHNIQUE: Multiplanar, multiecho pulse sequences of the brain and surrounding structures were obtained  without and with intravenous contrast. CONTRAST:  7.38mL GADAVIST GADOBUTROL 1 MMOL/ML IV SOLN COMPARISON:  Head CT from yesterday FINDINGS: Brain: No acute infarction, hemorrhage, hydrocephalus, extra-axial collection or mass lesion. Cerebral volume loss which is generalized. No unexpected white matter changes or abnormal enhancement. Vascular: Normal flow voids. Skull and upper cervical spine: Normal marrow signal. Sinuses/Orbits: Negative. IMPRESSION: 1. No acute or reversible finding. No evidence of metastatic disease. 2. Cerebral atrophy. Electronically Signed   By: Jorje Guild M.D.   On: 08/26/2021 04:44   DG Chest Portable 1 View  Result Date: 08/25/2021 CLINICAL DATA:  Weakness.  Confusion.  History of lung carcinoma. EXAM: PORTABLE CHEST 1 VIEW COMPARISON:  03/29/2012.  Chest CT, 01/29/2021. FINDINGS: Post treatment scarring extends superiorly from the right hilum, with volume loss in the right upper lobe. Remainder of the lungs is clear. No pleural effusion or pneumothorax. Cardiac silhouette is normal in size. No mediastinal or hilar masses. Skeletal structures are grossly intact. IMPRESSION: 1. No acute cardiopulmonary disease. Electronically Signed   By: Lajean Manes M.D.   On: 08/25/2021 16:43     Labs:   Basic Metabolic Panel: Recent Labs  Lab 08/25/21 1610 08/25/21 1620 08/27/21 0213  NA 135 134* 136  K 3.8 3.8 3.5  CL 99 101 106  CO2 23  --  23  GLUCOSE 134* 128* 111*  BUN 11 12 10   CREATININE 1.09* 0.90 0.92  CALCIUM 9.4  --  8.4*   GFR Estimated Creatinine Clearance: 54.2 mL/min (by C-G formula based on SCr of 0.92 mg/dL). Liver Function Tests: Recent Labs  Lab 08/25/21 1610  AST 37  ALT 32  ALKPHOS 64  BILITOT 1.0  PROT RESULTS UNAVAILABLE DUE TO INTERFERING SUBSTANCE  ALBUMIN 4.2   No results for input(s): LIPASE, AMYLASE in the last 168 hours. Recent Labs  Lab 08/25/21 2011  AMMONIA <10   Coagulation profile Recent Labs  Lab 08/25/21 1610  INR  0.9    CBC: Recent Labs  Lab 08/25/21 1610 08/25/21 1620 08/27/21 0213  WBC 11.1*  --  5.9  NEUTROABS 9.6*  --  4.6  HGB 15.1* 16.0* 11.7*  HCT 44.1 47.0* 35.1*  MCV 96.5  --  97.0  PLT 234  --  159   Cardiac Enzymes: No results for input(s): CKTOTAL, CKMB, CKMBINDEX, TROPONINI in the last 168 hours. BNP: Invalid input(s): POCBNP CBG: Recent Labs  Lab 08/25/21 1612 08/28/21 2152  GLUCAP 141* 105*   D-Dimer No results for input(s): DDIMER in the last 72 hours. Hgb A1c No results for input(s): HGBA1C in the last 72 hours. Lipid Profile No results for input(s): CHOL, HDL, LDLCALC, TRIG, CHOLHDL, LDLDIRECT in the last 72 hours. Thyroid function studies No results for input(s): TSH, T4TOTAL, T3FREE, THYROIDAB in the last 72 hours.  Invalid input(s): FREET3 Anemia work up No results for input(s): VITAMINB12, FOLATE, FERRITIN, TIBC, IRON, RETICCTPCT in the last 72 hours. Microbiology Recent Results (from the past 240 hour(s))  Resp Panel by RT-PCR (Flu A&B, Covid) Nasopharyngeal Swab     Status: None   Collection Time: 08/25/21  4:13 PM   Specimen: Nasopharyngeal Swab; Nasopharyngeal(NP) swabs in vial transport medium  Result Value Ref Range Status   SARS Coronavirus 2 by RT PCR NEGATIVE NEGATIVE Final    Comment: (NOTE) SARS-CoV-2 target nucleic acids are NOT DETECTED.  The SARS-CoV-2 RNA is generally detectable in upper respiratory specimens during the acute phase of infection. The lowest concentration of SARS-CoV-2 viral copies this assay can detect is 138 copies/mL. A negative result does not preclude SARS-Cov-2 infection and should not be used as the sole basis for treatment or other patient management decisions. A negative result may occur with  improper specimen collection/handling, submission of specimen other than nasopharyngeal swab, presence of viral mutation(s) within the areas targeted by this assay, and inadequate number of viral copies(<138  copies/mL). A negative result must be combined with clinical observations, patient history, and epidemiological information. The expected result is Negative.  Fact Sheet for Patients:  EntrepreneurPulse.com.au  Fact Sheet for Healthcare Providers:  IncredibleEmployment.be  This test is no t yet approved or cleared by the [place] FDA and  has been authorized for detection and/or diagnosis of SARS-CoV-2 by FDA under an Emergency Use Authorization (EUA). This EUA will remain  in effect (meaning this test can be used) for the duration of the COVID-19 declaration under Section 564(b)(1) of the Act, 21 U.S.C.section 360bbb-3(b)(1), unless the authorization is terminated  or revoked sooner.       Influenza A by PCR NEGATIVE NEGATIVE Final   Influenza B by PCR NEGATIVE NEGATIVE Final    Comment: (NOTE) The Xpert Xpress SARS-CoV-2/FLU/RSV plus assay is intended as an aid in the diagnosis of influenza from Nasopharyngeal swab specimens and should not be used as a sole basis for treatment. Nasal washings and aspirates are unacceptable for Xpert Xpress SARS-CoV-2/FLU/RSV testing.  Fact Sheet for Patients: EntrepreneurPulse.com.au  Fact Sheet for Healthcare Providers: IncredibleEmployment.be  This test is not yet approved or cleared by the [place] FDA and has been authorized for detection and/or diagnosis of SARS-CoV-2 by FDA under an Emergency Use Authorization (EUA). This EUA will remain in effect (meaning this test can be used) for the duration of the COVID-19 declaration under Section 564(b)(1) of the Act, 21 U.S.C. section 360bbb-3(b)(1), unless the authorization is terminated or revoked.  Performed at [facility], [place] [address] [place], [place] [address]      Signed: Terrilee Croak  [facility] 08/30/2021, 12:01 PM

## 2021-08-30 NOTE — H&P (Signed)
Physical Medicine and Rehabilitation Admission H&P    Chief Complaint  Patient presents with   Functional deficits    HPI: [name] [name] is a 71 year old female with history of SCLC s/p chemo/XRT and prophylactic cranial XRT, B12 deficiency chronic LBP, anxiety d/o, ETOH abuse, pelvic Fx with CIR  07/2020 who was admitted on 08/25/21 with headaches, mild confusion, left sided weakness and right gaze preference. Per reports, patient had fall in mid November when she struck her head with onset of persistent headaches. CTA/P head was negative for LVO or significant stenosis. MRI brain without acute changes or metastatic disease. EEG was showed nonspecific cortical dysfunction in right frontoparietal region and negative for seizures or epileptiform discharge. 2D echo showed EF 65-70% and mild concentric LVH.   She has had issues with paranoia,  confusion and agitation due to alcohol withdrawal. She was treated with CIWA  protocol with improvement in mentation. Headaches have improved with Fioricet.  Swallow evaluation done due to history of dysphagia and  no signs of dysphagia noted---on regular. She continues to be limited by  mild left inattention with decreased awareness of deficits and weakness. CIR recommended due to functional decline.    Review of Systems  Constitutional:  Negative for chills and fever.  HENT:  Negative for ear pain and tinnitus.   Eyes:  Negative for blurred vision and double vision.  Respiratory:  Negative for cough and hemoptysis.   Cardiovascular:  Negative for chest pain and orthopnea.  Gastrointestinal:  Negative for abdominal pain, constipation, nausea and vomiting.  Genitourinary:  Negative for dysuria and urgency.  Musculoskeletal:  Positive for back pain (middle back), falls (2 falls in the past 2 months.) and myalgias.  Neurological:  Positive for sensory change (leg cramps at nights), weakness and headaches.    Past Medical History:   Diagnosis Date   Anemia    duering cancer treatment    Anxiety    Arthritis    OA hands    Bilateral lower extremity pain    Blood transfusion without reported diagnosis    during chemo for lung cancer   Cataract    forming  but very small    Cholelithiasis    Colon polyps    adenomatous   High cholesterol    History of chemotherapy    for lung cancer- completed 2013   Hx of radiation therapy 10/07/11 to 11/20/11   right lung   Hx of radiation therapy 01/07/2012- 01/21/12   cranial irradiation   Hypertension    Lung cancer ([place]) 09/19/11   Stage III currently in remission   Malignant melanoma of skin of [place]hus of right eye ([place]) 02/2017   Osteoporosis 04/2014   Dexa scan by Dr. Corinna Capra, T-score -2.7, next in 2 years, with at least 13 % major fracture in 10 years   Pitting edema    Bilateral lower extremities, duplex ultrasound 04/03/2015 normal, no DVT    Past Surgical History:  Procedure Laterality Date   CHOLECYSTECTOMY  04/2011   biliary stent placement   COLONOSCOPY     INCISIONAL HERNIA REPAIR  2015   MOHS SURGERY Right 02/2017   cheek and eyelid   Josph Macho Touch     Vaginal procedure   POLYPECTOMY     TUBAL LIGATION      Family History  Problem Relation Age of Onset   Bladder Cancer Father    Stroke Father    Colon polyps Father  Atrial fibrillation Mother    Heart disease Mother        CHF   Clotting disorder Mother        warfarin   Colon polyps Mother    Colon cancer Maternal Aunt 84   Drug abuse Son    Breast cancer Neg Hx    Esophageal cancer Neg Hx    Stomach cancer Neg Hx    Rectal cancer Neg Hx     Social History: Lives alone. 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 She  reports that she quit smoking about 9 years ago. Her smoking use included cigarettes. She has never used smokeless tobacco. She reports current alcohol use--drinks 1 1/2 bottle at nights. She reports that she does not use drugs.   Allergies  Allergen Reactions   Mosquito  (Diagnostic)    Tramadol Hcl Nausea And Vomiting   Codeine Nausea And Vomiting   Penicillins Rash    Medications Prior to Admission  Medication Sig Dispense Refill   acetaminophen (TYLENOL) 325 MG tablet Take 650 mg by mouth every 6 (six) hours as needed for mild pain, moderate pain or fever.     aspirin EC 81 MG tablet Take 81 mg daily by mouth.     Cholecalciferol (VITAMIN D3) 2000 UNITS capsule Take 4,000 Units by mouth daily.      citalopram (CELEXA) 20 MG tablet Take 1 tablet (20 mg total) by mouth daily. 30 tablet 0   Cyanocobalamin 2500 MCG CHEW Chew 2 tablets by mouth daily.     lisinopril (ZESTRIL) 2.5 MG tablet Take 1 tablet (2.5 mg total) by mouth daily. 90 tablet 3   metoprolol tartrate (LOPRESSOR) 25 MG tablet TAKE 1 TABLET(25 MG) BY MOUTH TWICE DAILY (Patient taking differently: Take 25 mg by mouth 2 (two) times daily.) 60 tablet 0   ondansetron (ZOFRAN-ODT) 8 MG disintegrating tablet Take 8 mg by mouth every 8 (eight) hours as needed for vomiting or nausea.     raloxifene (EVISTA) 60 MG tablet Take 1 tablet (60 mg total) by mouth daily. 90 tablet 3   REPATHA SURECLICK 009 MG/ML SOAJ INJECT 140 MG( 1 ML) UNDER THE SKIN EVERY 14 DAYS (Patient taking differently: Inject 140 mg into the skin every 14 (fourteen) days.) 2 mL 11   rosuvastatin (CRESTOR) 40 MG tablet Take 1 tablet (40 mg total) by mouth at bedtime. 90 tablet 1   COVID-19 mRNA vaccine, Moderna, (MODERNA COVID-19 VACCINE) 100 MCG/0.5ML injection Inject into the muscle. 0.25 mL 0    Drug Regimen Review  Drug regimen was reviewed and remains appropriate with no significant issues identified  Home: Home Living Family/patient expects to be discharged to:: Private residence Living Arrangements: Alone Available Help at Discharge: Family, Available PRN/intermittently Type of Home: House Home Access: Level entry Home Layout: One level Bathroom Shower/Tub: Multimedia programmer: Handicapped height Bathroom  Accessibility: Yes Home Equipment: Civil engineer, contracting, Radio producer - single point, Grab bars - toilet, Grab bars - tub/shower, Conservation officer, nature (2 wheels)  Lives With: Alone   Functional History: Prior Function Prior Level of Function : Needs assist  Cognitive Assist : ADLs (cognitive) (IADL tasks) ADLs Comments: Pt lives alone with her dog; drives and completes IADL tasks; states her step-sister helps as needed  Functional Status:  Mobility: Bed Mobility Overal bed mobility: Needs Assistance Bed Mobility: Supine to Sit Supine to sit: Min guard General bed mobility comments: Increased time. Close guard for safety. Transfers Overall transfer level: Needs assistance Equipment used: 1 person  hand held assist Transfers: Sit to/from Stand Sit to Stand: Min assist Bed to/from chair/wheelchair/BSC transfer type:: Step pivot Stand pivot transfers: Max assist, +2 physical assistance Step pivot transfers: Max assist, +2 physical assistance General transfer comment: Assist for balance support and safety. Poor understanding of Purewick attached to her (Purewick in briefs) and moving without regard to lines. Required specific cues to stop so I could unhook the Purewick from the tubing. Ambulation/Gait Ambulation/Gait assistance: Mod assist Gait Distance (Feet): 200 Feet Assistive device: 1 person hand held assist Gait Pattern/deviations: Step-through pattern, Decreased stride length, Staggering left, Drifts right/left, Trunk flexed General Gait Details: L inattention, heavy drifting to the L with frequent staggering to the L. Pt required almost constant assist (up to heavy mod assist) to prevent falls. Gait velocity: Decreased Gait velocity interpretation: 1.31 - 2.62 ft/sec, indicative of limited community ambulator    ADL: ADL Overall ADL's : Needs assistance/impaired Eating/Feeding: NPO Grooming: Maximal assistance Upper Body Bathing: Maximal assistance Upper Body Dressing : Maximal assistance Lower  Body Dressing: Maximal assistance Toilet Transfer: Maximal assistance, +2 for physical assistance Toileting- Clothing Manipulation and Hygiene: Maximal assistance Functional mobility during ADLs: Maximal assistance, +2 for physical assistance  Cognition: Cognition Overall Cognitive Status: Impaired/Different from baseline Arousal/Alertness: Awake/alert Orientation Level: Oriented X4 Year: 2023 Month: January Day of Week: Incorrect Attention: Sustained Sustained Attention: Impaired Sustained Attention Impairment: Verbal basic, Functional basic Memory: Impaired Memory Impairment: Retrieval deficit, Decreased recall of new information, Decreased short term memory Decreased Short Term Memory: Verbal basic, Functional basic Immediate Memory Recall: Sock, Blue, Bed Memory Recall Sock: Without Cue Memory Recall Blue: With Cue Memory Recall Bed: Not able to recall Awareness: Impaired Awareness Impairment: Emergent impairment, Intellectual impairment Problem Solving: Impaired Problem Solving Impairment: Verbal basic, Functional basic Executive Function: Reasoning, Organizing, Decision Making Reasoning: Impaired Reasoning Impairment: Verbal basic, Functional basic Organizing: Impaired Organizing Impairment: Verbal basic, Functional basic Decision Making: Impaired Decision Making Impairment: Verbal basic, Functional basic Behaviors: Perseveration Safety/Judgment: Impaired Cognition Arousal/Alertness: Awake/alert Behavior During Therapy: Flat affect Overall Cognitive Status: Impaired/Different from baseline Area of Impairment: Orientation, Attention, Memory, Following commands, Safety/judgement, Awareness, Problem solving Orientation Level: Situation Current Attention Level: Selective Memory: Decreased short-term memory Following Commands: Follows one step commands with increased time Safety/Judgement: Decreased awareness of deficits Awareness: Intellectual Problem Solving: Slow  processing, Requires verbal cues, Requires tactile cues General Comments: pt is struggling to replicate L LE movement, possibly motivation but not clear if abiltiy   Blood pressure 130/83, pulse 89, temperature 98.8 F (37.1 C), temperature source Oral, resp. rate 18, height 5\' 2"  (1.575 m), weight 71.6 kg, SpO2 98 %. Physical Exam Vitals and nursing note reviewed.  Constitutional:      Appearance: Normal appearance.  HENT:     Head: Normocephalic and atraumatic.     Right Ear: External ear normal.     Left Ear: External ear normal.     Nose: Nose normal.     Mouth/Throat:     Mouth: Mucous membranes are moist.     Pharynx: Oropharynx is clear.  Eyes:     Extraocular Movements: Extraocular movements intact.     Conjunctiva/sclera: Conjunctivae normal.     Pupils: Pupils are equal, round, and reactive to light.  Cardiovascular:     Rate and Rhythm: Normal rate.     Heart sounds: No murmur heard.   No gallop.  Pulmonary:     Effort: Pulmonary effort is normal. No respiratory distress.     Breath sounds: No  wheezing.  Abdominal:     General: Bowel sounds are normal. There is no distension.     Palpations: Abdomen is soft.     Tenderness: There is no abdominal tenderness.  Musculoskeletal:        General: No swelling or deformity. Normal range of motion.     Cervical back: Normal range of motion.     Right lower leg: No edema.     Left lower leg: No edema.  Skin:    General: Skin is warm and dry.  Neurological:     Mental Status: She is alert.     Comments: Alert and oriented x 3. Normal insight and awareness. Intact Memory. Normal language and speech. Cranial nerve exam unremarkable--there is no visual field deficits. Mild left upper extremity weakness 4+/5. LLE 4 to 4+/5. RUE 5/5. RLE 4+/5. Some cog-wheeling weakness and occasional delays in processing motor tasks on exam L>R, sl inattention. Mild dysmetria with LUE.  Saw no focal sensory deficits although feet were  hypersensitive to touch. DTR's 1+. No resting tone. No limb ataxia  Psychiatric:        Mood and Affect: Mood normal.        Behavior: Behavior normal.    Results for orders placed or performed during the hospital encounter of 08/25/21 (from the past 48 hour(s))  Glucose, capillary     Status: Abnormal   Collection Time: 08/28/21  9:52 PM  Result Value Ref Range   Glucose-Capillary 105 (H) 70 - 99 mg/dL    Comment: Glucose reference range applies only to samples taken after fasting for at least 8 hours.   Comment 1 Notify RN    Comment 2 Document in Chart    No results found.     Medical Problem List and Plan: 1. Functional deficits secondary to metabolic encephalopathy in the setting of post concussion syndrome. Neurology raised question of migraine variant. Pt described ocular migraines to me during my visit.  -patient may  shower  -ELOS/Goals: 7 days, supervision to mod I goals with PT and OT 2.  Antithrombotics: -DVT/anticoagulation:  Pharmaceutical: Lovenox  -antiplatelet therapy: ASA 3. Pain Management/post-traumatic headaches: Fioricet prn  4. Mood: LCSW to follow for evaluation and abuse  -antipsychotic agents: N/A 5. Neuropsych: This patient is not fully capable of making decisions on her own behalf. 6. Skin/Wound Care: LCSW to follow for evaluation and support.  7. Fluids/Electrolytes/Nutrition: Monitor I/O. Check lytes in am.  8. Ophthalmic migraines: Usually resolves without medication.   9. Lung cancer s/p  XRT/chemo: In remission. 10.Vitamin B 12 deficiency: Continue supplement.  11. Acute blood loss anemia:Mild --will recheck in am. 12. ETOH abuse: Discussed avoidance/cessation.    --continue to educate on avoidance.       Bary Leriche, PA-C 08/30/2021

## 2021-08-30 NOTE — H&P (Signed)
Physical Medicine and Rehabilitation Admission H&P        Chief Complaint  Patient presents with   Functional deficits      HPI: [name] [name] is a 71 year old female with history of SCLC s/p chemo/XRT and prophylactic cranial XRT, B12 deficiency chronic LBP, anxiety d/o, ETOH abuse, pelvic Fx with CIR  07/2020 who was admitted on 08/25/21 with headaches, mild confusion, left sided weakness and right gaze preference. Per reports, patient had fall in mid November when she struck her head with onset of persistent headaches. CTA/P head was negative for LVO or significant stenosis. MRI brain without acute changes or metastatic disease. EEG was showed nonspecific cortical dysfunction in right frontoparietal region and negative for seizures or epileptiform discharge. 2D echo showed EF 65-70% and mild concentric LVH.    She has had issues with paranoia,  confusion and agitation due to alcohol withdrawal. She was treated with CIWA  protocol with improvement in mentation. Headaches have improved with Fioricet.  Swallow evaluation done due to history of dysphagia and  no signs of dysphagia noted---on regular. She continues to be limited by  mild left inattention with decreased awareness of deficits and weakness. CIR recommended due to functional decline.      Review of Systems  Constitutional:  Negative for chills and fever.  HENT:  Negative for ear pain and tinnitus.   Eyes:  Negative for blurred vision and double vision.  Respiratory:  Negative for cough and hemoptysis.   Cardiovascular:  Negative for chest pain and orthopnea.  Gastrointestinal:  Negative for abdominal pain, constipation, nausea and vomiting.  Genitourinary:  Negative for dysuria and urgency.  Musculoskeletal:  Positive for back pain (middle back), falls (2 falls in the past 2 months.) and myalgias.  Neurological:  Positive for sensory change (leg cramps at nights), weakness and headaches.          Past Medical  History:  Diagnosis Date   Anemia      duering cancer treatment    Anxiety     Arthritis      OA hands    Bilateral lower extremity pain     Blood transfusion without reported diagnosis      during chemo for lung cancer   Cataract      forming  but very small    Cholelithiasis     Colon polyps      adenomatous   High cholesterol     History of chemotherapy      for lung cancer- completed 2013   Hx of radiation therapy 10/07/11 to 11/20/11    right lung   Hx of radiation therapy 01/07/2012- 01/21/12    cranial irradiation   Hypertension     Lung cancer ([place]) 09/19/11    Stage III currently in remission   Malignant melanoma of skin of canthus of right eye ([place]) 02/2017   Osteoporosis 04/2014    Dexa scan by Dr. Corinna Capra, T-score -2.7, next in 2 years, with at least 13 % major fracture in 10 years   Pitting edema      Bilateral lower extremities, duplex ultrasound 04/03/2015 normal, no DVT           Past Surgical History:  Procedure Laterality Date   CHOLECYSTECTOMY   04/2011    biliary stent placement   COLONOSCOPY       INCISIONAL HERNIA REPAIR   2015   MOHS SURGERY Right 02/2017    cheek and eyelid  Josph Macho Touch        Vaginal procedure   POLYPECTOMY       TUBAL LIGATION               Family History  Problem Relation Age of Onset   Bladder Cancer Father     Stroke Father     Colon polyps Father     Atrial fibrillation Mother     Heart disease Mother          CHF   Clotting disorder Mother          warfarin   Colon polyps Mother     Colon cancer Maternal Aunt 71   Drug abuse Son     Breast cancer Neg Hx     Esophageal cancer Neg Hx     Stomach cancer Neg Hx     Rectal cancer Neg Hx        Social History: Lives alone. 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 She  reports that she quit smoking about 9 years ago. Her smoking use included cigarettes. She has never used smokeless tobacco. She reports current alcohol use--drinks 1 1/2 bottle at nights. She reports that she  does not use drugs.         Allergies  Allergen Reactions   Mosquito (Diagnostic)     Tramadol Hcl Nausea And Vomiting   Codeine Nausea And Vomiting   Penicillins Rash            Medications Prior to Admission  Medication Sig Dispense Refill   acetaminophen (TYLENOL) 325 MG tablet Take 650 mg by mouth every 6 (six) hours as needed for mild pain, moderate pain or fever.       aspirin EC 81 MG tablet Take 81 mg daily by mouth.       Cholecalciferol (VITAMIN D3) 2000 UNITS capsule Take 4,000 Units by mouth daily.        citalopram (CELEXA) 20 MG tablet Take 1 tablet (20 mg total) by mouth daily. 30 tablet 0   Cyanocobalamin 2500 MCG CHEW Chew 2 tablets by mouth daily.       lisinopril (ZESTRIL) 2.5 MG tablet Take 1 tablet (2.5 mg total) by mouth daily. 90 tablet 3   metoprolol tartrate (LOPRESSOR) 25 MG tablet TAKE 1 TABLET(25 MG) BY MOUTH TWICE DAILY (Patient taking differently: Take 25 mg by mouth 2 (two) times daily.) 60 tablet 0   ondansetron (ZOFRAN-ODT) 8 MG disintegrating tablet Take 8 mg by mouth every 8 (eight) hours as needed for vomiting or nausea.       raloxifene (EVISTA) 60 MG tablet Take 1 tablet (60 mg total) by mouth daily. 90 tablet 3   REPATHA SURECLICK 326 MG/ML SOAJ INJECT 140 MG( 1 ML) UNDER THE SKIN EVERY 14 DAYS (Patient taking differently: Inject 140 mg into the skin every 14 (fourteen) days.) 2 mL 11   rosuvastatin (CRESTOR) 40 MG tablet Take 1 tablet (40 mg total) by mouth at bedtime. 90 tablet 1   COVID-19 mRNA vaccine, Moderna, (MODERNA COVID-19 VACCINE) 100 MCG/0.5ML injection Inject into the muscle. 0.25 mL 0      Drug Regimen Review  Drug regimen was reviewed and remains appropriate with no significant issues identified   Home: Home Living Family/patient expects to be discharged to:: Private residence Living Arrangements: Alone Available Help at Discharge: Family, Available PRN/intermittently Type of Home: House Home Access: Level entry Home Layout:  One level Bathroom Shower/Tub: Multimedia programmer: Handicapped height Bathroom  Accessibility: Yes Home Equipment: Shower seat, Cane - single point, Grab bars - toilet, Grab bars - tub/shower, Conservation officer, nature (2 wheels)  Lives With: Alone   Functional History: Prior Function Prior Level of Function : Needs assist  Cognitive Assist : ADLs (cognitive) (IADL tasks) ADLs Comments: Pt lives alone with her dog; drives and completes IADL tasks; states her step-sister helps as needed   Functional Status:  Mobility: Bed Mobility Overal bed mobility: Needs Assistance Bed Mobility: Supine to Sit Supine to sit: Min guard General bed mobility comments: Increased time. Close guard for safety. Transfers Overall transfer level: Needs assistance Equipment used: 1 person hand held assist Transfers: Sit to/from Stand Sit to Stand: Min assist Bed to/from chair/wheelchair/BSC transfer type:: Step pivot Stand pivot transfers: Max assist, +2 physical assistance Step pivot transfers: Max assist, +2 physical assistance General transfer comment: Assist for balance support and safety. Poor understanding of Purewick attached to her (Purewick in briefs) and moving without regard to lines. Required specific cues to stop so I could unhook the Purewick from the tubing. Ambulation/Gait Ambulation/Gait assistance: Mod assist Gait Distance (Feet): 200 Feet Assistive device: 1 person hand held assist Gait Pattern/deviations: Step-through pattern, Decreased stride length, Staggering left, Drifts right/left, Trunk flexed General Gait Details: L inattention, heavy drifting to the L with frequent staggering to the L. Pt required almost constant assist (up to heavy mod assist) to prevent falls. Gait velocity: Decreased Gait velocity interpretation: 1.31 - 2.62 ft/sec, indicative of limited community ambulator   ADL: ADL Overall ADL's : Needs assistance/impaired Eating/Feeding: NPO Grooming: Maximal  assistance Upper Body Bathing: Maximal assistance Upper Body Dressing : Maximal assistance Lower Body Dressing: Maximal assistance Toilet Transfer: Maximal assistance, +2 for physical assistance Toileting- Clothing Manipulation and Hygiene: Maximal assistance Functional mobility during ADLs: Maximal assistance, +2 for physical assistance   Cognition: Cognition Overall Cognitive Status: Impaired/Different from baseline Arousal/Alertness: Awake/alert Orientation Level: Oriented X4 Year: 2023 Month: January Day of Week: Incorrect Attention: Sustained Sustained Attention: Impaired Sustained Attention Impairment: Verbal basic, Functional basic Memory: Impaired Memory Impairment: Retrieval deficit, Decreased recall of new information, Decreased short term memory Decreased Short Term Memory: Verbal basic, Functional basic Immediate Memory Recall: Sock, Blue, Bed Memory Recall Sock: Without Cue Memory Recall Blue: With Cue Memory Recall Bed: Not able to recall Awareness: Impaired Awareness Impairment: Emergent impairment, Intellectual impairment Problem Solving: Impaired Problem Solving Impairment: Verbal basic, Functional basic Executive Function: Reasoning, Organizing, Decision Making Reasoning: Impaired Reasoning Impairment: Verbal basic, Functional basic Organizing: Impaired Organizing Impairment: Verbal basic, Functional basic Decision Making: Impaired Decision Making Impairment: Verbal basic, Functional basic Behaviors: Perseveration Safety/Judgment: Impaired Cognition Arousal/Alertness: Awake/alert Behavior During Therapy: Flat affect Overall Cognitive Status: Impaired/Different from baseline Area of Impairment: Orientation, Attention, Memory, Following commands, Safety/judgement, Awareness, Problem solving Orientation Level: Situation Current Attention Level: Selective Memory: Decreased short-term memory Following Commands: Follows one step commands with increased  time Safety/Judgement: Decreased awareness of deficits Awareness: Intellectual Problem Solving: Slow processing, Requires verbal cues, Requires tactile cues General Comments: pt is struggling to replicate L LE movement, possibly motivation but not clear if abiltiy     Blood pressure 130/83, pulse 89, temperature 98.8 F (37.1 C), temperature source Oral, resp. rate 18, height 5\' 2"  (1.575 m), weight 71.6 kg, SpO2 98 %. Physical Exam Vitals and nursing note reviewed.  Constitutional:      Appearance: Normal appearance.  HENT:     Head: Normocephalic and atraumatic.     Right Ear: External ear normal.  Left Ear: External ear normal.     Nose: Nose normal.     Mouth/Throat:     Mouth: Mucous membranes are moist.     Pharynx: Oropharynx is clear.  Eyes:     Extraocular Movements: Extraocular movements intact.     Conjunctiva/sclera: Conjunctivae normal.     Pupils: Pupils are equal, round, and reactive to light.  Cardiovascular:     Rate and Rhythm: Normal rate.     Heart sounds: No murmur heard.   No gallop.  Pulmonary:     Effort: Pulmonary effort is normal. No respiratory distress.     Breath sounds: No wheezing.  Abdominal:     General: Bowel sounds are normal. There is no distension.     Palpations: Abdomen is soft.     Tenderness: There is no abdominal tenderness.  Musculoskeletal:        General: No swelling or deformity. Normal range of motion.     Cervical back: Normal range of motion.     Right lower leg: No edema.     Left lower leg: No edema.  Skin:    General: Skin is warm and dry.  Neurological:     Mental Status: She is alert.     Comments: Alert and oriented x 3. Normal insight and awareness. Intact Memory. Normal language and speech. Cranial nerve exam unremarkable--there is no visual field deficits. Mild left upper extremity weakness 4+/5. LLE 4 to 4+/5. RUE 5/5. RLE 4+/5. Some cog-wheeling weakness and occasional delays in processing motor tasks on exam  L>R, sl inattention. Mild dysmetria with LUE.  Saw no focal sensory deficits although feet were hypersensitive to touch. DTR's 1+. No resting tone. No limb ataxia  Psychiatric:        Mood and Affect: Mood normal.        Behavior: Behavior normal.      Lab Results Last 48 Hours        Results for orders placed or performed during the hospital encounter of 08/25/21 (from the past 48 hour(s))  Glucose, capillary     Status: Abnormal    Collection Time: 08/28/21  9:52 PM  Result Value Ref Range    Glucose-Capillary 105 (H) 70 - 99 mg/dL      Comment: Glucose reference range applies only to samples taken after fasting for at least 8 hours.    Comment 1 Notify RN      Comment 2 Document in Chart        Imaging Results (Last 48 hours)  No results found.           Medical Problem List and Plan: 1. Functional deficits secondary to metabolic encephalopathy in the setting of post concussion syndrome. Neurology raised question of migraine variant. Pt described ocular migraines to me during my visit.             -patient may  shower             -ELOS/Goals: 7 days, supervision to mod I goals with PT and OT 2.  Antithrombotics: -DVT/anticoagulation:  Pharmaceutical: Lovenox             -antiplatelet therapy: ASA 3. Pain Management/post-traumatic headaches: Fioricet prn  4. Mood: LCSW to follow for evaluation and abuse             -antipsychotic agents: N/A 5. Neuropsych: This patient is not fully capable of making decisions on her own behalf. 6. Skin/Wound Care: LCSW to follow for evaluation  and support.  7. Fluids/Electrolytes/Nutrition: Monitor I/O. Check lytes in am.  8. Ophthalmic migraines: Usually resolves without medication.   9. Lung cancer s/p  XRT/chemo: In remission. 10.Vitamin B 12 deficiency: Continue supplement.  11. Acute blood loss anemia:Mild --will recheck in am. 12. ETOH abuse: Discussed avoidance/cessation.                --continue to educate on avoidance.         Bary Leriche, PA-C 08/30/2021   I have personally performed a face to face diagnostic evaluation of this patient and formulated the key components of the plan.  Additionally, I have personally reviewed laboratory data, imaging studies, as well as relevant notes and concur with the physician assistant's documentation above.  The patient's status has not changed from the original H&P.  Any changes in documentation from the acute care chart have been noted above.  Meredith Staggers, MD, [facility]

## 2021-08-30 NOTE — Consult Note (Signed)
Brief Psychiatry Consult Note  The psychiatry service was consulted to assess for pt capacity to refuse CIR. She has since assented to CIR admission; discussed with Dr. Marlowe Aschoff who no longer desires consult; will not see pt.   Trygve Thal A Akeem Heppler

## 2021-08-30 NOTE — Progress Notes (Signed)
Inpatient Rehabilitation Admission Medication Review by a Pharmacist  A complete drug regimen review was completed for this patient to identify any potential clinically significant medication issues.  High Risk Drug Classes Is patient taking? Indication by Medication  Antipsychotic Yes Compazine for N/V  Anticoagulant Yes Lovenox for VTE ppx  Antibiotic No   Opioid No   Antiplatelet No   Hypoglycemics/insulin No   Vasoactive Medication Yes Carvedilol for BP  Chemotherapy No   Other Yes      Type of Medication Issue Identified Description of Issue Recommendation(s)  Drug Interaction(s) (clinically significant)     Duplicate Therapy     Allergy     No Medication Administration End Date     Incorrect Dose     Additional Drug Therapy Needed     Significant med changes from prior encounter (inform family/care partners about these prior to discharge). On Crestor 40 daily and Evista 60 mg daily prior to admission Resume?  Other       Clinically significant medication issues were identified that warrant physician communication and completion of prescribed/recommended actions by midnight of the next day:  No   Pharmacist comments: Resume Crestor and Evista?  Time spent performing this drug regimen review (minutes):  20 minutes   Tad Moore 08/30/2021 3:20 PM

## 2021-08-30 NOTE — Plan of Care (Signed)
  Problem: Health Behavior/Discharge Planning: Goal: Ability to manage health-related needs will improve Outcome: Progressing   Problem: Safety: Goal: Ability to remain free from injury will improve Outcome: Progressing   

## 2021-08-30 NOTE — Progress Notes (Signed)
Inpatient Rehabilitation Admissions Coordinator   CIR bed is available today and patient is in agreement to admit. I will alert Acute team and TOC to make the arrangements to admit today.  Danne Baxter, RN, MSN Rehab Admissions Coordinator [phone] 08/30/2021 11:27 AM

## 2021-08-31 LAB — COMPREHENSIVE METABOLIC PANEL
ALT: 39 U/L (ref 0–44)
AST: 47 U/L — ABNORMAL HIGH (ref 15–41)
Albumin: 3 g/dL — ABNORMAL LOW (ref 3.5–5.0)
Alkaline Phosphatase: 43 U/L (ref 38–126)
Anion gap: 6 (ref 5–15)
BUN: 10 mg/dL (ref 8–23)
CO2: 24 mmol/L (ref 22–32)
Calcium: 9.1 mg/dL (ref 8.9–10.3)
Chloride: 105 mmol/L (ref 98–111)
Creatinine, Ser: 0.86 mg/dL (ref 0.44–1.00)
GFR, Estimated: >60 mL/min (ref >60)
Glucose, Bld: 101 mg/dL — ABNORMAL HIGH (ref 70–99)
Potassium: 3.7 mmol/L (ref 3.5–5.1)
Sodium: 135 mmol/L (ref 135–145)
Total Bilirubin: 0.4 mg/dL (ref 0.3–1.2)
Total Protein: 5.4 g/dL — ABNORMAL LOW (ref 6.5–8.1)

## 2021-08-31 LAB — CBC WITH DIFFERENTIAL/PLATELET
Abs Immature Granulocytes: 0.01 10*3/uL (ref 0.00–0.07)
Basophils Absolute: 0 10*3/uL (ref 0.0–0.1)
Basophils Relative: 1 %
Eosinophils Absolute: 0.1 10*3/uL (ref 0.0–0.5)
Eosinophils Relative: 3 %
HCT: 35 % — ABNORMAL LOW (ref 36.0–46.0)
Hemoglobin: 12 g/dL (ref 12.0–15.0)
Immature Granulocytes: 0 %
Lymphocytes Relative: 15 %
Lymphs Abs: 0.7 10*3/uL (ref 0.7–4.0)
MCH: 32.7 pg (ref 26.0–34.0)
MCHC: 34.3 g/dL (ref 30.0–36.0)
MCV: 95.4 fL (ref 80.0–100.0)
Monocytes Absolute: 0.5 10*3/uL (ref 0.1–1.0)
Monocytes Relative: 11 %
Neutro Abs: 3.4 10*3/uL (ref 1.7–7.7)
Neutrophils Relative %: 70 %
Platelets: 191 10*3/uL (ref 150–400)
RBC: 3.67 MIL/uL — ABNORMAL LOW (ref 3.87–5.11)
RDW: 11.6 % (ref 11.5–15.5)
WBC: 4.8 10*3/uL (ref 4.0–10.5)
nRBC: 0 % (ref 0.0–0.2)

## 2021-08-31 NOTE — Evaluation (Addendum)
Occupational Therapy Assessment and Plan  Patient Details  Name: [name] [name] MRN: [id number] Date of Birth: Oct 30, 1950  OT Diagnosis: cognitive deficits and impairments in coordination, unsteadiness on feet Rehab Potential: Rehab Potential (ACUTE ONLY): Good ELOS:     Today's Date: 08/31/2021 OT Individual Time: 9675-9163 OT Individual Time Calculation (min): 85 min     Hospital Problem: Principal Problem:   Encephalopathy   Past Medical History:  Past Medical History:  Diagnosis Date   Anemia    duering cancer treatment    Anxiety    Arthritis    OA hands    Bilateral lower extremity pain    Blood transfusion without reported diagnosis    during chemo for lung cancer   Cataract    forming  but very small    Cholelithiasis    Colon polyps    adenomatous   High cholesterol    History of chemotherapy    for lung cancer- completed 2013   Hx of radiation therapy 10/07/11 to 11/20/11   right lung   Hx of radiation therapy 01/07/2012- 01/21/12   cranial irradiation   Hypertension    Lung cancer ([place]) 09/19/11   Stage III currently in remission   Malignant melanoma of skin of [place]hus of right eye ([place]) 02/2017   Osteoporosis 04/2014   Dexa scan by Dr. Corinna Capra, T-score -2.7, next in 2 years, with at least 13 % major fracture in 10 years   Pitting edema    Bilateral lower extremities, duplex ultrasound 04/03/2015 normal, no DVT   Past Surgical History:  Past Surgical History:  Procedure Laterality Date   CHOLECYSTECTOMY  04/2011   biliary stent placement   COLONOSCOPY     INCISIONAL HERNIA REPAIR  2015   MOHS SURGERY Right 02/2017   cheek and eyelid   Josph Macho Touch     Vaginal procedure   POLYPECTOMY     TUBAL LIGATION      Assessment & Plan Clinical Impression: [name] [name] is a 71 year old female with history of SCLC s/p chemo/XRT and prophylactic cranial XRT, B12 deficiency chronic LBP, anxiety d/o, ETOH abuse, pelvic Fx with CIR  07/2020 who was  admitted on 08/25/21 with headaches, mild confusion, left sided weakness and right gaze preference. Per reports, patient had fall in mid November when she struck her head with onset of persistent headaches. CTA/P head was negative for LVO or significant stenosis. MRI brain without acute changes or metastatic disease. EEG was showed nonspecific cortical dysfunction in right frontoparietal region and negative for seizures or epileptiform discharge. 2D echo showed EF 65-70% and mild concentric LVH.    She has had issues with paranoia,  confusion and agitation due to alcohol withdrawal. She was treated with CIWA  protocol with improvement in mentation. Headaches have improved with Fioricet.  Swallow evaluation done due to history of dysphagia and  no signs of dysphagia noted---on regular. She continues to be limited by  mild left inattention with decreased awareness of deficits and weakness..  Patient transferred to CIR on 08/30/2021 .    Patient currently requires supervision to contact guard assist with basic self-care skills and IADL secondary to muscle weakness, decreased coordination, left side neglect, decreased attention, decreased awareness, decreased problem solving, decreased safety awareness, decreased memory, and delayed processing, and decreased sitting balance, decreased standing balance, decreased postural control, and decreased balance strategies.  Prior to hospitalization, patient could complete ADLs and IADLs with intermittent supervision.  Patient will benefit from  skilled intervention to increase independence with basic self-care skills prior to discharge home with care partner.  Anticipate patient will require intermittent supervision and  home health OT versus no OT follow up .  OT - End of Session Activity Tolerance: Tolerates 30+ min activity with multiple rests Endurance Deficit: Yes Endurance Deficit Description: Mild deficit; benefits from minimal seated rest breaks during basic self  care OT Assessment Rehab Potential (ACUTE ONLY): Good OT Patient demonstrates impairments in the following area(s): Balance;Perception;Safety;Cognition;Endurance;Motor;Vision OT Basic ADL's Functional Problem(s): Bathing;Dressing;Toileting OT Advanced ADL's Functional Problem(s): Simple Meal Preparation;Laundry;Light Housekeeping OT Transfers Functional Problem(s): Toilet;Tub/Shower OT Plan OT Intensity: Minimum of 1-2 x/day, 45 to 90 minutes OT Frequency: 5 out of 7 days OT Treatment/Interventions: Balance/vestibular training;Discharge planning;Self Care/advanced ADL retraining;Therapeutic Activities;UE/LE Coordination activities;Cognitive remediation/compensation;Functional mobility training;Disease mangement/prevention;Patient/family education;Therapeutic Exercise;Visual/perceptual remediation/compensation;Community reintegration;DME/adaptive equipment instruction;Neuromuscular re-education;Psychosocial support;UE/LE Strength taining/ROM OT Basic Self-Care Anticipated Outcome(s): mod I- supervision OT Toileting Anticipated Outcome(s): mod I OT Bathroom Transfers Anticipated Outcome(s): mod I OT Recommendation Patient destination: Home Follow Up Recommendations: Outpatient OT Equipment Recommended: To be determined Equipment Details: currently owns a shower chair, RW, and cane   OT Evaluation Precautions/Restrictions  Precautions Precautions: Fall Precaution Comments: L neglect Restrictions Other Position/Activity Restrictions: Pt requires some significant cueing and direction of attention to get LLE to move in requested ways General Chart Reviewed: Yes Pain Pain Assessment Pain Scale: 0-10 Pain Score: 0-No pain Home Living/Prior Functioning Home Living Family/patient expects to be discharged to:: Private residence Living Arrangements: Alone Available Help at Discharge: Family, Available 24 hours/day (however, pt is strongly opposed to living with sister and tried this after  last stay; ended up leaving and going back to her own place) Type of Home: House Home Access: Level entry Home Layout: One level Bathroom Shower/Tub: Multimedia programmer: Handicapped height Bathroom Accessibility: Yes Additional Comments: Lives in accessible townhome with 1 dog  Lives With: Alone IADL History Education: Gaffer Prior Function Vocation: Retired Clinical biochemist) Vision Baseline Vision/History: 1 Wears glasses Ability to See in Adequate Light: 1 Impaired Patient Visual Report: Eye fatigue/eye pain/headache;Blurring of vision Vision Assessment?: Yes Eye Alignment: Within Functional Limits Ocular Range of Motion: Within Functional Limits Alignment/Gaze Preference: Within Defined Limits Tracking/Visual Pursuits: Requires cues, head turns, or add eye shifts to track Saccades: Additional eye shifts occurred during testing;Decreased speed of saccadic movement Visual Fields: Left visual field deficit Perception  Perception: Impaired Inattention/Neglect: Does not attend to left side of body (mild) Praxis Praxis: Intact Cognition Overall Cognitive Status: Impaired/Different from baseline Arousal/Alertness: Awake/alert Orientation Level: Person;Place;Situation Person: Oriented Place: Oriented Situation: Oriented Year: 2022 (able to problem solve using compensatory technique to correct self) Month: January Day of Week: Incorrect Memory: Impaired Memory Impairment: Retrieval deficit;Decreased recall of new information;Decreased short term memory Decreased Short Term Memory: Verbal basic;Functional basic Immediate Memory Recall: Sock;Blue;Bed Memory Recall Sock: Without Cue Memory Recall Blue: With Cue Memory Recall Bed: Without Cue Attention: Sustained;Selective;Alternating Sustained Attention: Impaired Sustained Attention Impairment: Verbal basic;Functional basic Selective Attention: Impaired Selective Attention Impairment: Verbal  basic;Functional basic Awareness: Impaired Awareness Impairment: Emergent impairment Problem Solving: Impaired Problem Solving Impairment: Functional basic;Functional complex;Verbal complex Executive Function: Reasoning;Organizing;Decision Making;Sequencing;Initiating Reasoning: Impaired Reasoning Impairment: Verbal basic;Functional basic Sequencing: Appears intact Organizing: Impaired Organizing Impairment: Functional complex;Verbal complex Decision Making: Impaired Decision Making Impairment: Functional complex;Verbal complex Initiating: Appears intact Safety/Judgment: Impaired Sensation Sensation Light Touch: Appears Intact Hot/Cold: Appears Intact Proprioception: Appears Intact Stereognosis: Appears Intact Coordination Gross Motor Movements are Fluid and Coordinated: No Fine  Motor Movements are Fluid and Coordinated: Yes °Finger Nose Finger Test: WNL BUE °Motor  °Motor °Motor: Within Functional Limits  °Trunk/Postural Assessment  °Cervical Assessment °Cervical Assessment: Within Functional Limits °Thoracic Assessment °Thoracic Assessment: Within Functional Limits °Lumbar Assessment °Lumbar Assessment: Within Functional Limits °Postural Control °Postural Control: Deficits on evaluation °Righting Reactions: delayed  °Balance °Balance °Balance Assessed: Yes °Static Sitting Balance °Static Sitting - Balance Support: Feet unsupported;No upper extremity supported °Static Sitting - Level of Assistance: 7: Independent °Dynamic Sitting Balance °Dynamic Sitting - Balance Support: Feet supported;No upper extremity supported °Dynamic Sitting - Level of Assistance: 7: Independent °Static Standing Balance °Static Standing - Balance Support: No upper extremity supported;During functional activity °Static Standing - Level of Assistance: 5: Stand by assistance °Dynamic Standing Balance °Dynamic Standing - Balance Support: During functional activity;No upper extremity supported °Dynamic Standing - Level of  Assistance: 5: Stand by assistance °Extremity/Trunk Assessment °RUE Assessment °RUE Assessment: Exceptions to WFL °Passive Range of Motion (PROM) Comments: WNL °Active Range of Motion (AROM) Comments: WNL °General Strength Comments: grip 4-/5; shoulder, elbow, wrist WNL °LUE Assessment °LUE Assessment: Exceptions to WFL °Passive Range of Motion (PROM) Comments: WNL °Active Range of Motion (AROM) Comments: WNL °General Strength Comments: grip 4-/5; shoulder, elbow, wrist WNL ° °Care Tool °Care Tool Self Care °Eating   °Eating Assist Level: Independent °   °Oral Care    °Oral Care Assist Level: Set up assist °   °Bathing   °  °  °  °Assist Level: Contact Guard/Touching assist °   °Upper Body Dressing(including orthotics)   °  °  °Assist Level: Set up assist °   °Lower Body Dressing (excluding footwear)   °  °Assist for lower body dressing: Supervision/Verbal cueing °   °Putting on/Taking off footwear   °  °Assist for footwear: Set up assist °   °  ° Care Tool Toileting °Toileting activity   °Assist for toileting: Supervision/Verbal cueing °   ° °Care Tool Bed Mobility °Roll left and right activity   °  °   °Sit to lying activity   °  °   °Lying to sitting on side of bed activity   °  °   ° °Care Tool Transfers °Sit to stand transfer   °  °   °Chair/bed transfer   °  °   ° Toilet transfer   °Assist Level: Contact Guard/Touching assist °   ° °Care Tool Cognition ° °Expression of Ideas and Wants Expression of Ideas and Wants: 3. Some difficulty - exhibits some difficulty with expressing needs and ideas (e.g, some words or finishing thoughts) or speech is not clear  °Understanding Verbal and Non-Verbal Content Understanding Verbal and Non-Verbal Content: 3. Usually understands - understands most conversations, but misses some part/intent of message. Requires cues at times to understand °  °Memory/Recall Ability Memory/Recall Ability : Current season;That he or she is in a hospital/hospital unit  ° °Refer to Care Plan for  Long Term Goals ° °SHORT TERM GOAL WEEK 1 °OT Short Term Goal 1 (Week 1): STGs=LTGs due to ELOS ° °Recommendations for other services: None   ° °Skilled Therapeutic Intervention °ADL °ADL °Eating: Independent °Where Assessed-Eating: Chair °Grooming: Modified independent °Where Assessed-Grooming: Standing at sink;Sitting at sink °Upper Body Bathing: Modified independent °Where Assessed-Upper Body Bathing: Sitting at sink °Lower Body Bathing: Supervision/safety °Where Assessed-Lower Body Bathing: Sitting at sink;Standing at sink °Upper Body Dressing: Setup °Where Assessed-Upper Body Dressing: Standing at sink °Lower Body Dressing: Supervision/safety °Where Assessed-Lower Body   Dressing: Standing at sink °Toileting: Supervision/safety °Where Assessed-Toileting: Toilet °Toilet Transfer: Contact guard °Toilet Transfer Method: Ambulating °Mobility  °Bed Mobility °Bed Mobility: Rolling Right;Rolling Left;Supine to Sit °Rolling Right: Independent °Rolling Left: Independent °Supine to Sit: Independent °Transfers °Sit to Stand: Independent with assistive device °Stand to Sit: Supervision/Verbal cueing ° °Skilled Treatment: Pt semi reclined in bed, no c/o pain, agreeable to session but politely stating "My sister is the one that thinks I should be here, I think Im fine".  Initial Evaluation completed, and collaborated with pt regarding OT POC.  Pt completed above self care and functional mobility.  Educated pt on seat alarm and importance of requesting assist using call bell if needing to ambulate or transfer for any reason.  Pt sitting up in recliner, seat alarm on, call bell in reach at end of session.   ° ° °Discharge Criteria: Patient will be discharged from OT if patient refuses treatment 3 consecutive times without medical reason, if treatment goals not met, if there is a change in medical status, if patient makes no progress towards goals or if patient is discharged from hospital. ° °The above assessment, treatment plan,  treatment alternatives and goals were discussed and mutually agreed upon: by patient ° °Maydell Knoebel L Brean Carberry °08/31/2021, 11:19 AM  °

## 2021-08-31 NOTE — Plan of Care (Signed)
°  Problem: RH Problem Solving Goal: LTG Patient will demonstrate problem solving for (SLP) Description: LTG:  Patient will demonstrate problem solving for basic/complex daily situations with cues  (SLP) Flowsheets (Taken 08/31/2021 1248) LTG: Patient will demonstrate problem solving for (SLP): Complex daily situations LTG Patient will demonstrate problem solving for: Supervision   Problem: RH Memory Goal: LTG Patient will demonstrate ability for day to day (SLP) Description: LTG:   Patient will demonstrate ability for day to day recall/carryover during cognitive/linguistic activities with assist  (SLP) Flowsheets (Taken 08/31/2021 1248) LTG: Patient will demonstrate ability for day to day recall: Daily complex information LTG: Patient will demonstrate ability for day to day recall/carryover during cognitive/linguistic activities with assist (SLP): Supervision   Problem: RH Awareness Goal: LTG: Patient will demonstrate awareness during functional activites type of (SLP) Description: LTG: Patient will demonstrate awareness during functional activites type of (SLP) Flowsheets (Taken 08/31/2021 1248) LTG: Patient will demonstrate awareness during cognitive/linguistic activities with assistance of (SLP): Supervision

## 2021-08-31 NOTE — Evaluation (Signed)
Physical Therapy Assessment and Plan  Patient Details  Name: [name] [name] MRN: [id number] Date of Birth: February 01, 1951  PT Diagnosis: Abnormality of gait, Cognitive deficits, Edema, Impaired cognition, and Muscle weakness Rehab Potential: Good ELOS: ~5-7 days   Today's Date: 08/31/2021 PT Individual Time: 2355-7322 PT Individual Time Calculation (min): 40 min    Hospital Problem: Principal Problem:   Encephalopathy   Past Medical History:  Past Medical History:  Diagnosis Date   Anemia    duering cancer treatment    Anxiety    Arthritis    OA hands    Bilateral lower extremity pain    Blood transfusion without reported diagnosis    during chemo for lung cancer   Cataract    forming  but very small    Cholelithiasis    Colon polyps    adenomatous   High cholesterol    History of chemotherapy    for lung cancer- completed 2013   Hx of radiation therapy 10/07/11 to 11/20/11   right lung   Hx of radiation therapy 01/07/2012- 01/21/12   cranial irradiation   Hypertension    Lung cancer ([place]) 09/19/11   Stage III currently in remission   Malignant melanoma of skin of canthus of right eye ([place]) 02/2017   Osteoporosis 04/2014   Dexa scan by Dr. Corinna Capra, T-score -2.7, next in 2 years, with at least 13 % major fracture in 10 years   Pitting edema    Bilateral lower extremities, duplex ultrasound 04/03/2015 normal, no DVT   Past Surgical History:  Past Surgical History:  Procedure Laterality Date   CHOLECYSTECTOMY  04/2011   biliary stent placement   COLONOSCOPY     INCISIONAL HERNIA REPAIR  2015   MOHS SURGERY Right 02/2017   cheek and eyelid   Josph Macho Touch     Vaginal procedure   POLYPECTOMY     TUBAL LIGATION      Assessment & Plan Clinical Impression: Patient is a 71 y.o. year old female with history of SCLC s/p chemo/XRT and prophylactic cranial XRT, B12 deficiency chronic LBP, anxiety d/o, ETOH abuse, pelvic Fx with CIR  07/2020 who was admitted on 08/25/21  with headaches, mild confusion, left sided weakness and right gaze preference. Per reports, patient had fall in mid November when she struck her head with onset of persistent headaches. CTA/P head was negative for LVO or significant stenosis. MRI brain without acute changes or metastatic disease. EEG was showed nonspecific cortical dysfunction in right frontoparietal region and negative for seizures or epileptiform discharge. 2D echo showed EF 65-70% and mild concentric LVH.    She has had issues with paranoia,  confusion and agitation due to alcohol withdrawal. She was treated with CIWA  protocol with improvement in mentation. Headaches have improved with Fioricet.  Swallow evaluation done due to history of dysphagia and  no signs of dysphagia noted---on regular. She continues to be limited by  mild left inattention with decreased awareness of deficits and weakness. CIR recommended due to functional decline. Patient transferred to CIR on 08/30/2021 .   Patient currently requires CGA/min assist with mobility secondary to muscle weakness, decreased cardiorespiratoy endurance, impaired timing and sequencing and unbalanced muscle activation, decreased attention to left, decreased attention, decreased awareness, decreased problem solving, decreased safety awareness, and decreased memory, and decreased sitting balance, decreased standing balance, decreased postural control, and decreased balance strategies.  Prior to hospitalization, patient was independent  with mobility and lived with Alone in a House  home.  Home access is  Level entry.  Patient will benefit from skilled PT intervention to maximize safe functional mobility, minimize fall risk, and decrease caregiver burden for planned discharge home with 24 hour supervision.  Anticipate patient will benefit from follow up OP at discharge.  PT - End of Session Activity Tolerance: Tolerates 30+ min activity with multiple rests Endurance Deficit: Yes Endurance  Deficit Description: Mild deficit; benefits from minimal seated rest breaks during functional mobility PT Assessment Rehab Potential (ACUTE/IP ONLY): Good PT Barriers to Discharge: Other (comments) PT Barriers to Discharge Comments: cognitive impairments PT Patient demonstrates impairments in the following area(s): Balance;Pain;Behavior;Perception;Edema;Safety;Endurance;Sensory;Motor;Skin Integrity;Nutrition PT Transfers Functional Problem(s): Bed Mobility;Bed to Chair;Car;Furniture;Floor PT Locomotion Functional Problem(s): Ambulation;Stairs PT Plan PT Intensity: Minimum of 1-2 x/day ,45 to 90 minutes PT Frequency: 5 out of 7 days PT Duration Estimated Length of Stay: ~5-7 days PT Treatment/Interventions: Ambulation/gait training;Cognitive remediation/compensation;Discharge planning;DME/adaptive equipment instruction;Functional mobility training;Pain management;Psychosocial support;Splinting/orthotics;Therapeutic Activities;UE/LE Strength taining/ROM;Visual/perceptual remediation/compensation;Balance/vestibular training;Community reintegration;Disease management/prevention;Functional electrical stimulation;Neuromuscular re-education;Patient/family education;Skin care/wound management;Stair training;Therapeutic Exercise;UE/LE Coordination activities PT Transfers Anticipated Outcome(s): mod-I using LRAD PT Locomotion Anticipated Outcome(s): supervision using LRAD PT Recommendation Follow Up Recommendations: Outpatient PT;24 hour supervision/assistance Patient destination: Home Equipment Recommended: To be determined   PT Evaluation Precautions/Restrictions Precautions Precautions: Fall Precaution Comments: L neglect Restrictions Weight Bearing Restrictions: No Other Position/Activity Restrictions: Pt requires some significant cueing and direction of attention to orient to L hemibody and hemi-environment Pain Pain Assessment Pain Scale: 0-10 Pain Score: 0-No pain Pain  Interference Pain Interference Pain Effect on Sleep: 1. Rarely or not at all Pain Interference with Therapy Activities: 1. Rarely or not at all Pain Interference with Day-to-Day Activities: 2. Occasionally Home Living/Prior Functioning Home Living Available Help at Discharge: Family;Available 24 hours/day Type of Home: House Home Access: Level entry Home Layout: One level  Lives With: Alone Prior Function Level of Independence: Independent with gait;Independent with transfers  Able to Take Stairs?: Yes Driving: Yes Vision/Perception  Vision - History Ability to See in Adequate Light: 1 Impaired Perception Perception: Impaired Inattention/Neglect: Does not attend to left side of body;Does not attend to left visual field Praxis Praxis: Intact  Cognition Overall Cognitive Status: Impaired/Different from baseline Arousal/Alertness: Awake/alert Orientation Level: Oriented X4 Year: 2023 Month: January Day of Week: Correct Attention: Focused;Sustained;Selective;Alternating Focused Attention: Appears intact Sustained Attention: Appears intact Selective Attention: Impaired Alternating Attention: Impaired Memory: Impaired Awareness: Impaired Problem Solving: Impaired Reasoning: Impaired Behaviors: Impulsive Safety/Judgment: Impaired Sensation Sensation Light Touch: Appears Intact Hot/Cold: Not tested Proprioception: Appears Intact Stereognosis: Not tested Coordination Gross Motor Movements are Fluid and Coordinated: No Coordination and Movement Description: generalized weakness and deconditioning with impaired balance Motor  Motor Motor: Other (comment) Motor - Skilled Clinical Observations: generalized weakness and deconditioning with impaired balance   Trunk/Postural Assessment  Cervical Assessment Cervical Assessment: Within Functional Limits Thoracic Assessment Thoracic Assessment: Within Functional Limits Lumbar Assessment Lumbar Assessment: Within Functional  Limits Postural Control Postural Control: Deficits on evaluation Righting Reactions: delayed and insufficient  Balance Balance Balance Assessed: Yes Standardized Balance Assessment Standardized Balance Assessment: Functional Gait Assessment Static Sitting Balance Static Sitting - Balance Support: Feet unsupported;No upper extremity supported Static Sitting - Level of Assistance: 5: Stand by assistance Dynamic Sitting Balance Dynamic Sitting - Balance Support: Feet supported;No upper extremity supported Dynamic Sitting - Level of Assistance: 5: Stand by assistance Static Standing Balance Static Standing - Balance Support: No upper extremity supported;During functional activity Static Standing - Level of Assistance: 5: Stand by assistance;Other (comment) (CGA) Dynamic  Standing Balance Dynamic Standing - Balance Support: No upper extremity supported;During functional activity Dynamic Standing - Level of Assistance: Other (comment);4: Min assist (CGA) Functional Gait  Assessment Gait assessed : Yes Gait Level Surface: Walks 20 ft, slow speed, abnormal gait pattern, evidence for imbalance or deviates 10-15 in outside of the 12 in walkway width. Requires more than 7 sec to ambulate 20 ft. Change in Gait Speed: Makes only minor adjustments to walking speed, or accomplishes a change in speed with significant gait deviations, deviates 10-15 in outside the 12 in walkway width, or changes speed but loses balance but is able to recover and continue walking. Gait with Horizontal Head Turns: Performs head turns smoothly with slight change in gait velocity (eg, minor disruption to smooth gait path), deviates 6-10 in outside 12 in walkway width, or uses an assistive device. Gait with Vertical Head Turns: Performs task with moderate change in gait velocity, slows down, deviates 10-15 in outside 12 in walkway width but recovers, can continue to walk. Gait and Pivot Turn: Pivot turns safely in greater than 3  sec and stops with no loss of balance, or pivot turns safely within 3 sec and stops with mild imbalance, requires small steps to catch balance. Step Over Obstacle: Cannot perform without assistance. Gait with Narrow Base of Support: Ambulates less than 4 steps heel to toe or cannot perform without assistance. Gait with Eyes Closed: Cannot walk 20 ft without assistance, severe gait deviations or imbalance, deviates greater than 15 in outside 12 in walkway width or will not attempt task. Ambulating Backwards: Cannot walk 20 ft without assistance, severe gait deviations or imbalance, deviates greater than 15 in outside 12 in walkway width or will not attempt task. Steps: Two feet to a stair, must use rail. Total Score: 8 Extremity Assessment  RLE Assessment RLE Assessment: Exceptions to [facility] Active Range of Motion (AROM) Comments: WFL RLE Strength Right Hip Flexion: 4+/5 Right Knee Flexion: 4+/5 Right Knee Extension: 5/5 Right Ankle Dorsiflexion: 4+/5 Right Ankle Plantar Flexion: 4+/5 LLE Assessment LLE Assessment: Exceptions to [facility] Active Range of Motion (AROM) Comments: WFL LLE Strength Left Hip Flexion: 4-/5 Left Knee Flexion: 4-/5 Left Knee Extension: 4/5 Left Ankle Dorsiflexion: 4/5 Left Ankle Plantar Flexion: 4/5  Care Tool Care Tool Bed Mobility Roll left and right activity   Roll left and right assist level: Independent    Sit to lying activity   Sit to lying assist level: Supervision/Verbal cueing    Lying to sitting on side of bed activity   Lying to sitting on side of bed assist level: the ability to move from lying on the back to sitting on the side of the bed with no back support.: Supervision/Verbal cueing     Care Tool Transfers Sit to stand transfer   Sit to stand assist level: Supervision/Verbal cueing    Chair/bed transfer   Chair/bed transfer assist level: Contact Guard/Touching assist     Product manager transfer assist level:  Contact Guard/Touching assist      Care Tool Locomotion Ambulation   Assist level: Minimal Assistance - Patient > 75% Assistive device: No Device Max distance: >238ft  Walk 10 feet activity   Assist level: Contact Guard/Touching assist Assistive device: No Device   Walk 50 feet with 2 turns activity   Assist level: Contact Guard/Touching assist Assistive device: No Device  Walk 150 feet activity   Assist level: Contact Guard/Touching assist Assistive  device: No Device  Walk 10 feet on uneven surfaces activity   Assist level: Minimal Assistance - Patient > 75% Assistive device: Other (comment) (handrail)  Stairs   Assist level: Contact Guard/Touching assist Stairs assistive device: 2 hand rails Max number of stairs: 12  Walk up/down 1 step activity   Walk up/down 1 step (curb) assist level: Contact Guard/Touching assist Walk up/down 1 step or curb assistive device: 2 hand rails  Walk up/down 4 steps activity   Walk up/down 4 steps assist level: Contact Guard/Touching assist Walk up/down 4 steps assistive device: 2 hand rails  Walk up/down 12 steps activity   Walk up/down 12 steps assist level: Contact Guard/Touching assist Walk up/down 12 steps assistive device: 2 hand rails  Pick up small objects from floor   Pick up small object from the floor assist level: Minimal Assistance - Patient > 75%    Wheelchair Is the patient using a wheelchair?: No          Wheel 50 feet with 2 turns activity      Wheel 150 feet activity        Refer to Care Plan for Long Term Goals  SHORT TERM GOAL WEEK 1 PT Short Term Goal 1 (Week 1): = to LTGs based on ELOS   Recommendations for other services: None   Skilled Therapeutic Intervention Pt received supine in bed with her sister, [name] [name], present and pt agreeable to therapy session. Evaluation completed (see details above) with patient education regarding purpose of PT evaluation, PT POC and goals, therapy schedule, weekly team  meetings, and other CIR information including safety plan and fall risk safety. Pt performed the following functional mobility tasks with the specified levels of assistance and cuing. Pt participated in Functional Gait Assessment (FGA) with score of 8/30 demonstrating high fall risk (low fall risk 25-28, medium fall risk 19-24, and high fall risk <19). Patient demonstrates L inattention throughout session requiring repeated cuing to visually scan in that direction. During gait training back to room told pt her room number to have her navigate in correct direction; however, pt unable to find her room on L side of hallway without max problem solving cuing. Pt demonstrates higher level dynamic balance impairments placing her at increased fall risk. At end of session, pt left supine in bed with needs in reach, bed alarm on, and her sister present.   Mobility Bed Mobility Bed Mobility: Supine to Sit;Sit to Supine Supine to Sit: Supervision/Verbal cueing Sit to Supine: Supervision/Verbal cueing Transfers Transfers: Sit to Stand;Stand to Sit;Stand Pivot Transfers Sit to Stand: Supervision/Verbal cueing Stand to Sit: Supervision/Verbal cueing Stand Pivot Transfers: Contact Guard/Touching assist Stand Pivot Transfer Details: Verbal cues for precautions/safety;Verbal cues for technique;Verbal cues for sequencing Transfer (Assistive device): None Locomotion  Gait Ambulation: Yes Gait Assistance: Contact Guard/Touching assist;Minimal Assistance - Patient > 75% Gait Distance (Feet): 200 Feet Assistive device: None Gait Assistance Details: Verbal cues for precautions/safety;Verbal cues for gait pattern;Verbal cues for technique Gait Gait: Yes Gait Pattern: Step-through pattern Gait velocity: Decreased High Level Ambulation High Level Ambulation:  (refer to FGA) Stairs / Additional Locomotion Stairs: Yes Stairs Assistance: Contact Guard/Touching assist Stair Management Technique: Two  rails;Forwards;Alternating pattern;Step to pattern (ascended reciprocally and descended step-to) Number of Stairs: 12 Height of Stairs: 6 Ramp: Contact Guard/touching assist Curb: Minimal Assistance - Patient >75% (using handrail support) Wheelchair Mobility Wheelchair Mobility: No   Discharge Criteria: Patient will be discharged from PT if patient refuses treatment 3 consecutive times without  medical reason, if treatment goals not met, if there is a change in medical status, if patient makes no progress towards goals or if patient is discharged from hospital.  The above assessment, treatment plan, treatment alternatives and goals were discussed and mutually agreed upon: by patient and by family  Tawana Scale , PT, DPT, NCS, CSRS  08/31/2021, 12:19 PM

## 2021-08-31 NOTE — Evaluation (Incomplete Revision)
Occupational Therapy Assessment and Plan  Patient Details  Name: [name] [name] MRN: [id number] Date of Birth: Oct 30, 1950  OT Diagnosis: cognitive deficits and impairments in coordination, unsteadiness on feet Rehab Potential: Rehab Potential (ACUTE ONLY): Good ELOS:     Today's Date: 08/31/2021 OT Individual Time: 9675-9163 OT Individual Time Calculation (min): 85 min     Hospital Problem: Principal Problem:   Encephalopathy   Past Medical History:  Past Medical History:  Diagnosis Date   Anemia    duering cancer treatment    Anxiety    Arthritis    OA hands    Bilateral lower extremity pain    Blood transfusion without reported diagnosis    during chemo for lung cancer   Cataract    forming  but very small    Cholelithiasis    Colon polyps    adenomatous   High cholesterol    History of chemotherapy    for lung cancer- completed 2013   Hx of radiation therapy 10/07/11 to 11/20/11   right lung   Hx of radiation therapy 01/07/2012- 01/21/12   cranial irradiation   Hypertension    Lung cancer ([place]) 09/19/11   Stage III currently in remission   Malignant melanoma of skin of [place] of right eye ([place]) 02/2017   Osteoporosis 04/2014   Dexa scan by Dr. Corinna Capra, T-score -2.7, next in 2 years, with at least 13 % major fracture in 10 years   Pitting edema    Bilateral lower extremities, duplex ultrasound 04/03/2015 normal, no DVT   Past Surgical History:  Past Surgical History:  Procedure Laterality Date   CHOLECYSTECTOMY  04/2011   biliary stent placement   COLONOSCOPY     INCISIONAL HERNIA REPAIR  2015   MOHS SURGERY Right 02/2017   cheek and eyelid   Josph Macho Touch     Vaginal procedure   POLYPECTOMY     TUBAL LIGATION      Assessment & Plan Clinical Impression: [name] [name] is a 71 year old female with history of SCLC s/p chemo/XRT and prophylactic cranial XRT, B12 deficiency chronic LBP, anxiety d/o, ETOH abuse, pelvic Fx with CIR  07/2020 who was  admitted on 08/25/21 with headaches, mild confusion, left sided weakness and right gaze preference. Per reports, patient had fall in mid November when she struck her head with onset of persistent headaches. CTA/P head was negative for LVO or significant stenosis. MRI brain without acute changes or metastatic disease. EEG was showed nonspecific cortical dysfunction in right frontoparietal region and negative for seizures or epileptiform discharge. 2D echo showed EF 65-70% and mild concentric LVH.    She has had issues with paranoia,  confusion and agitation due to alcohol withdrawal. She was treated with CIWA  protocol with improvement in mentation. Headaches have improved with Fioricet.  Swallow evaluation done due to history of dysphagia and  no signs of dysphagia noted---on regular. She continues to be limited by  mild left inattention with decreased awareness of deficits and weakness..  Patient transferred to CIR on 08/30/2021 .    Patient currently requires supervision to contact guard assist with basic self-care skills and IADL secondary to muscle weakness, decreased coordination, left side neglect, decreased attention, decreased awareness, decreased problem solving, decreased safety awareness, decreased memory, and delayed processing, and decreased sitting balance, decreased standing balance, decreased postural control, and decreased balance strategies.  Prior to hospitalization, patient could complete ADLs and IADLs with intermittent supervision.  Patient will benefit from  skilled intervention to increase independence with basic self-care skills prior to discharge home with care partner.  Anticipate patient will require intermittent supervision and  home health OT versus no OT follow up .  OT - End of Session Activity Tolerance: Tolerates 30+ min activity with multiple rests Endurance Deficit: Yes Endurance Deficit Description: Mild deficit; benefits from minimal seated rest breaks during basic self  care OT Assessment Rehab Potential (ACUTE ONLY): Good OT Patient demonstrates impairments in the following area(s): Balance;Perception;Safety;Cognition;Endurance;Motor;Vision OT Basic ADL's Functional Problem(s): Bathing;Dressing;Toileting OT Advanced ADL's Functional Problem(s): Simple Meal Preparation;Laundry;Light Housekeeping OT Transfers Functional Problem(s): Toilet;Tub/Shower OT Plan OT Intensity: Minimum of 1-2 x/day, 45 to 90 minutes OT Frequency: 5 out of 7 days OT Treatment/Interventions: Balance/vestibular training;Discharge planning;Self Care/advanced ADL retraining;Therapeutic Activities;UE/LE Coordination activities;Cognitive remediation/compensation;Functional mobility training;Disease mangement/prevention;Patient/family education;Therapeutic Exercise;Visual/perceptual remediation/compensation;Community reintegration;DME/adaptive equipment instruction;Neuromuscular re-education;Psychosocial support;UE/LE Strength taining/ROM OT Basic Self-Care Anticipated Outcome(s): mod I- supervision OT Toileting Anticipated Outcome(s): mod I OT Bathroom Transfers Anticipated Outcome(s): mod I OT Recommendation Patient destination: Home Follow Up Recommendations: Outpatient OT Equipment Recommended: To be determined Equipment Details: currently owns a shower chair, RW, and cane   OT Evaluation Precautions/Restrictions  Precautions Precautions: Fall Precaution Comments: L neglect Restrictions Other Position/Activity Restrictions: Pt requires some significant cueing and direction of attention to get LLE to move in requested ways General Chart Reviewed: Yes Pain Pain Assessment Pain Scale: 0-10 Pain Score: 0-No pain Home Living/Prior Functioning Home Living Family/patient expects to be discharged to:: Private residence Living Arrangements: Alone Available Help at Discharge: Family, Available 24 hours/day (however, pt is strongly opposed to living with sister and tried this after  last stay; ended up leaving and going back to her own place) Type of Home: House Home Access: Level entry Home Layout: One level Bathroom Shower/Tub: Multimedia programmer: Handicapped height Bathroom Accessibility: Yes Additional Comments: Lives in accessible townhome with 1 dog  Lives With: Alone IADL History Education: Gaffer Prior Function Vocation: Retired Clinical biochemist) Vision Baseline Vision/History: 1 Wears glasses Ability to See in Adequate Light: 1 Impaired Patient Visual Report: Eye fatigue/eye pain/headache;Blurring of vision Vision Assessment?: Yes Eye Alignment: Within Functional Limits Ocular Range of Motion: Within Functional Limits Alignment/Gaze Preference: Within Defined Limits Tracking/Visual Pursuits: Requires cues, head turns, or add eye shifts to track Saccades: Additional eye shifts occurred during testing;Decreased speed of saccadic movement Visual Fields: Left visual field deficit Perception  Perception: Impaired Inattention/Neglect: Does not attend to left side of body (mild) Praxis Praxis: Intact Cognition Overall Cognitive Status: Impaired/Different from baseline Arousal/Alertness: Awake/alert Orientation Level: Person;Place;Situation Person: Oriented Place: Oriented Situation: Oriented Year: 2022 (able to problem solve using compensatory technique to correct self) Month: January Day of Week: Incorrect Memory: Impaired Memory Impairment: Retrieval deficit;Decreased recall of new information;Decreased short term memory Decreased Short Term Memory: Verbal basic;Functional basic Immediate Memory Recall: Sock;Blue;Bed Memory Recall Sock: Without Cue Memory Recall Blue: With Cue Memory Recall Bed: Without Cue Attention: Sustained;Selective;Alternating Sustained Attention: Impaired Sustained Attention Impairment: Verbal basic;Functional basic Selective Attention: Impaired Selective Attention Impairment: Verbal  basic;Functional basic Awareness: Impaired Awareness Impairment: Emergent impairment Problem Solving: Impaired Problem Solving Impairment: Functional basic;Functional complex;Verbal complex Executive Function: Reasoning;Organizing;Decision Making;Sequencing;Initiating Reasoning: Impaired Reasoning Impairment: Verbal basic;Functional basic Sequencing: Appears intact Organizing: Impaired Organizing Impairment: Functional complex;Verbal complex Decision Making: Impaired Decision Making Impairment: Functional complex;Verbal complex Initiating: Appears intact Safety/Judgment: Impaired Sensation Sensation Light Touch: Appears Intact Hot/Cold: Appears Intact Proprioception: Appears Intact Stereognosis: Appears Intact Coordination Gross Motor Movements are Fluid and Coordinated: No Fine  Motor Movements are Fluid and Coordinated: Yes °Finger Nose Finger Test: WNL BUE °Motor  °Motor °Motor: Within Functional Limits  °Trunk/Postural Assessment  °Cervical Assessment °Cervical Assessment: Within Functional Limits °Thoracic Assessment °Thoracic Assessment: Within Functional Limits °Lumbar Assessment °Lumbar Assessment: Within Functional Limits °Postural Control °Postural Control: Deficits on evaluation °Righting Reactions: delayed  °Balance °Balance °Balance Assessed: Yes °Static Sitting Balance °Static Sitting - Balance Support: Feet unsupported;No upper extremity supported °Static Sitting - Level of Assistance: 7: Independent °Dynamic Sitting Balance °Dynamic Sitting - Balance Support: Feet supported;No upper extremity supported °Dynamic Sitting - Level of Assistance: 7: Independent °Static Standing Balance °Static Standing - Balance Support: No upper extremity supported;During functional activity °Static Standing - Level of Assistance: 5: Stand by assistance °Dynamic Standing Balance °Dynamic Standing - Balance Support: During functional activity;No upper extremity supported °Dynamic Standing - Level of  Assistance: 5: Stand by assistance °Extremity/Trunk Assessment °RUE Assessment °RUE Assessment: Exceptions to WFL °Passive Range of Motion (PROM) Comments: WNL °Active Range of Motion (AROM) Comments: WNL °General Strength Comments: grip 4-/5; shoulder, elbow, wrist WNL °LUE Assessment °LUE Assessment: Exceptions to WFL °Passive Range of Motion (PROM) Comments: WNL °Active Range of Motion (AROM) Comments: WNL °General Strength Comments: grip 4-/5; shoulder, elbow, wrist WNL ° °Care Tool °Care Tool Self Care °Eating   °Eating Assist Level: Independent °   °Oral Care    °Oral Care Assist Level: Set up assist °   °Bathing   °  °  °  °Assist Level: Contact Guard/Touching assist °   °Upper Body Dressing(including orthotics)   °  °  °Assist Level: Set up assist °   °Lower Body Dressing (excluding footwear)   °  °Assist for lower body dressing: Supervision/Verbal cueing °   °Putting on/Taking off footwear   °  °Assist for footwear: Set up assist °   °  ° Care Tool Toileting °Toileting activity   °Assist for toileting: Supervision/Verbal cueing °   ° °Care Tool Bed Mobility °Roll left and right activity   °  °   °Sit to lying activity   °  °   °Lying to sitting on side of bed activity   °  °   ° °Care Tool Transfers °Sit to stand transfer   °  °   °Chair/bed transfer   °  °   ° Toilet transfer   °Assist Level: Contact Guard/Touching assist °   ° °Care Tool Cognition ° °Expression of Ideas and Wants Expression of Ideas and Wants: 3. Some difficulty - exhibits some difficulty with expressing needs and ideas (e.g, some words or finishing thoughts) or speech is not clear  °Understanding Verbal and Non-Verbal Content Understanding Verbal and Non-Verbal Content: 3. Usually understands - understands most conversations, but misses some part/intent of message. Requires cues at times to understand °  °Memory/Recall Ability Memory/Recall Ability : Current season;That he or she is in a hospital/hospital unit  ° °Refer to Care Plan for  Long Term Goals ° °SHORT TERM GOAL WEEK 1 °OT Short Term Goal 1 (Week 1): STGs=LTGs due to ELOS ° °Recommendations for other services: None   ° °Skilled Therapeutic Intervention °ADL °ADL °Eating: Independent °Where Assessed-Eating: Chair °Grooming: Modified independent °Where Assessed-Grooming: Standing at sink;Sitting at sink °Upper Body Bathing: Modified independent °Where Assessed-Upper Body Bathing: Sitting at sink °Lower Body Bathing: Supervision/safety °Where Assessed-Lower Body Bathing: Sitting at sink;Standing at sink °Upper Body Dressing: Setup °Where Assessed-Upper Body Dressing: Standing at sink °Lower Body Dressing: Supervision/safety °Where Assessed-Lower Body   Dressing: Standing at sink °Toileting: Supervision/safety °Where Assessed-Toileting: Toilet °Toilet Transfer: Contact guard °Toilet Transfer Method: Ambulating °Mobility  °Bed Mobility °Bed Mobility: Rolling Right;Rolling Left;Supine to Sit °Rolling Right: Independent °Rolling Left: Independent °Supine to Sit: Independent °Transfers °Sit to Stand: Independent with assistive device °Stand to Sit: Supervision/Verbal cueing ° °Skilled Treatment: Pt semi reclined in bed, no c/o pain, agreeable to session but politely stating "My sister is the one that thinks I should be here, I think Im fine".  Initial Evaluation completed, and collaborated with pt regarding OT POC.  Pt completed above self care and functional mobility.  Educated pt on seat alarm and importance of requesting assist using call bell if needing to ambulate or transfer for any reason.  Pt sitting up in recliner, seat alarm on, call bell in reach at end of session.   ° ° °Discharge Criteria: Patient will be discharged from OT if patient refuses treatment 3 consecutive times without medical reason, if treatment goals not met, if there is a change in medical status, if patient makes no progress towards goals or if patient is discharged from hospital. ° °The above assessment, treatment plan,  treatment alternatives and goals were discussed and mutually agreed upon: by patient ° ° L  °08/31/2021, 11:19 AM  °

## 2021-08-31 NOTE — Progress Notes (Signed)
PROGRESS NOTE   Subjective/Complaints:  No HA- got rid of that-  Very frustrated by bed alarm, but explained we have ot keep it on to keep her in bed.  LBM 2-3 days ago-  Wasn't eating much.  Regularly has severe diarrhea- so doesn't want intervention right now.   ROS:  Pt denies SOB, abd pain, CP, N/V/C/D, and vision changes   Objective:   No results found. Recent Labs    08/31/21 0552  WBC 4.8  HGB 12.0  HCT 35.0*  PLT 191   Recent Labs    08/31/21 0552  NA 135  K 3.7  CL 105  CO2 24  GLUCOSE 101*  BUN 10  CREATININE 0.86  CALCIUM 9.1    Intake/Output Summary (Last 24 hours) at 08/31/2021 1659 Last data filed at 08/31/2021 1400 Gross per 24 hour  Intake 660 ml  Output --  Net 660 ml        Physical Exam: Vital Signs Blood pressure 117/77, pulse 93, temperature 98.2 F (36.8 C), temperature source Oral, resp. rate 18, height 5\' 2"  (1.575 m), weight 67.5 kg, SpO2 98 %.   General: awake, alert, appropriate, laying supine in bed; NAD HENT: conjugate gaze; oropharynx moist CV: regular rate; no JVD Pulmonary: CTA B/L; no W/R/R- good air movement GI: soft, NT, ND, (+)BS Psychiatric: appropriate Neurological: alert Skin:    General: Skin is warm and dry.  Neurological:     Mental Status: She is alert.     Comments: Alert and oriented x 3. Normal insight and awareness. Intact Memory. Normal language and speech. Cranial nerve exam unremarkable--there is no visual field deficits. Mild left upper extremity weakness 4+/5. LLE 4 to 4+/5. RUE 5/5. RLE 4+/5. Some cog-wheeling weakness and occasional delays in processing motor tasks on exam L>R, sl inattention. Mild dysmetria with LUE.  Saw no focal sensory deficits although feet were hypersensitive to touch. DTR's 1+. No resting tone. No limb ataxia     Assessment/Plan: 1. Functional deficits which require 3+ hours per day of interdisciplinary therapy in a  comprehensive inpatient rehab setting. Physiatrist is providing close team supervision and 24 hour management of active medical problems listed below. Physiatrist and rehab team continue to assess barriers to discharge/monitor patient progress toward functional and medical goals  Care Tool:  Bathing              Bathing assist Assist Level: Contact Guard/Touching assist     Upper Body Dressing/Undressing Upper body dressing        Upper body assist Assist Level: Set up assist    Lower Body Dressing/Undressing Lower body dressing            Lower body assist Assist for lower body dressing: Supervision/Verbal cueing     Toileting Toileting    Toileting assist Assist for toileting: Supervision/Verbal cueing     Transfers Chair/bed transfer  Transfers assist           Locomotion Ambulation   Ambulation assist              Walk 10 feet activity   Assist  Walk 50 feet activity   Assist           Walk 150 feet activity   Assist           Walk 10 feet on uneven surface  activity   Assist           Wheelchair     Assist               Wheelchair 50 feet with 2 turns activity    Assist            Wheelchair 150 feet activity     Assist          Blood pressure 117/77, pulse 93, temperature 98.2 F (36.8 C), temperature source Oral, resp. rate 18, height 5\' 2"  (1.575 m), weight 67.5 kg, SpO2 98 %.  Medical Problem List and Plan: 1. Functional deficits secondary to metabolic encephalopathy in the setting of post concussion syndrome. Neurology raised question of migraine variant. Pt described ocular migraines to me during my visit.             -patient may  shower             -ELOS/Goals: 7 days, supervision to mod I goals with PT and OT  First day of evaluations- Continue CIR- PT, OT and SLP  2.  Antithrombotics: -DVT/anticoagulation:  Pharmaceutical: Lovenox              -antiplatelet therapy: ASA 3. Pain Management/post-traumatic headaches: Fioricet prn 1/7- HA is better this AM- con't regimen  4. Mood: LCSW to follow for evaluation and abuse             -antipsychotic agents: N/A 5. Neuropsych: This patient is ?not fully capable of making decisions on her own behalf. 6. Skin/Wound Care: LCSW to follow for evaluation and support.  7. Fluids/Electrolytes/Nutrition: Monitor I/O. Check lytes in am.   1/7- I personally reviewe-d labs look good- con't to check weekly.  8. Ophthalmic migraines: Usually resolves without medication.   9. Lung cancer s/p  XRT/chemo: In remission. 10.Vitamin B 12 deficiency: Continue supplement.  11. Acute blood loss anemia:Mild --will recheck in am. 12. ETOH abuse: Discussed avoidance/cessation.                --continue to educate on avoidance.       LOS: 1 days A FACE TO FACE EVALUATION WAS PERFORMED  Harolyn Cocker 08/31/2021, 4:59 PM

## 2021-08-31 NOTE — Plan of Care (Signed)
°  Problem: RH Balance Goal: LTG Patient will maintain dynamic sitting balance (PT) Description: LTG:  Patient will maintain dynamic sitting balance with assistance during mobility activities (PT) Flowsheets (Taken 08/31/2021 1837) LTG: Pt will maintain dynamic sitting balance during mobility activities with:: Independent Goal: LTG Patient will maintain dynamic standing balance (PT) Description: LTG:  Patient will maintain dynamic standing balance with assistance during mobility activities (PT) Flowsheets (Taken 08/31/2021 1837) LTG: Pt will maintain dynamic standing balance during mobility activities with:: Supervision/Verbal cueing   Problem: Sit to Stand Goal: LTG:  Patient will perform sit to stand with assistance level (PT) Description: LTG:  Patient will perform sit to stand with assistance level (PT) Flowsheets (Taken 08/31/2021 1837) LTG: PT will perform sit to stand in preparation for functional mobility with assistance level: Independent with assistive device   Problem: RH Bed Mobility Goal: LTG Patient will perform bed mobility with assist (PT) Description: LTG: Patient will perform bed mobility with assistance, with/without cues (PT). Flowsheets (Taken 08/31/2021 1837) LTG: Pt will perform bed mobility with assistance level of: Independent with assistive device    Problem: RH Bed to Chair Transfers Goal: LTG Patient will perform bed/chair transfers w/assist (PT) Description: LTG: Patient will perform bed to chair transfers with assistance (PT). Flowsheets (Taken 08/31/2021 1837) LTG: Pt will perform Bed to Chair Transfers with assistance level: Independent with assistive device    Problem: RH Car Transfers Goal: LTG Patient will perform car transfers with assist (PT) Description: LTG: Patient will perform car transfers with assistance (PT). Flowsheets (Taken 08/31/2021 1837) LTG: Pt will perform car transfers with assist:: Supervision/Verbal cueing   Problem: RH Ambulation Goal: LTG  Patient will ambulate in controlled environment (PT) Description: LTG: Patient will ambulate in a controlled environment, # of feet with assistance (PT). Flowsheets (Taken 08/31/2021 1837) LTG: Pt will ambulate in controlled environ  assist needed:: Supervision/Verbal cueing LTG: Ambulation distance in controlled environment: 166ft using LRAD Goal: LTG Patient will ambulate in home environment (PT) Description: LTG: Patient will ambulate in home environment, # of feet with assistance (PT). Flowsheets (Taken 08/31/2021 1837) LTG: Pt will ambulate in home environ  assist needed:: Independent with assistive device LTG: Ambulation distance in home environment: 26ft using LRAD Goal: LTG Patient will ambulate in community environment (PT) Description: LTG: Patient will ambulate in community environment, # of feet with assistance (PT). Flowsheets (Taken 08/31/2021 1837) LTG: Pt will ambulate in community environ  assist needed:: Supervision/Verbal cueing LTG: Ambulation distance in community environment: >219ft using LRAD

## 2021-08-31 NOTE — Evaluation (Signed)
Speech Language Pathology Assessment and Plan  Patient Details  Name: [name] [name] MRN: [id number] Date of Birth: 1951-02-26  SLP Diagnosis: Cognitive Impairments  Rehab Potential: Good ELOS: 5-7 days    Today's Date: 08/31/2021 SLP Individual Time: 1100-1200 SLP Individual Time Calculation (min): 60 min  Hospital Problem: Principal Problem:   Encephalopathy  Past Medical History:  Past Medical History:  Diagnosis Date   Anemia    duering cancer treatment    Anxiety    Arthritis    OA hands    Bilateral lower extremity pain    Blood transfusion without reported diagnosis    during chemo for lung cancer   Cataract    forming  but very small    Cholelithiasis    Colon polyps    adenomatous   High cholesterol    History of chemotherapy    for lung cancer- completed 2013   Hx of radiation therapy 10/07/11 to 11/20/11   right lung   Hx of radiation therapy 01/07/2012- 01/21/12   cranial irradiation   Hypertension    Lung cancer ([place]) 09/19/11   Stage III currently in remission   Malignant melanoma of skin of [place]hus of right eye ([place]) 02/2017   Osteoporosis 04/2014   Dexa scan by Dr. Corinna Capra, T-score -2.7, next in 2 years, with at least 13 % major fracture in 10 years   Pitting edema    Bilateral lower extremities, duplex ultrasound 04/03/2015 normal, no DVT   Past Surgical History:  Past Surgical History:  Procedure Laterality Date   CHOLECYSTECTOMY  04/2011   biliary stent placement   COLONOSCOPY     INCISIONAL HERNIA REPAIR  2015   MOHS SURGERY Right 02/2017   cheek and eyelid   Josph Macho Touch     Vaginal procedure   POLYPECTOMY     TUBAL LIGATION      Assessment / Plan / Recommendation Clinical Impression  Patient is a 71 y.o. year old female with history of SCLC s/p chemo/XRT and prophylactic cranial XRT, B12 deficiency chronic LBP, anxiety d/o, ETOH abuse, pelvic Fx with CIR  07/2020 who was admitted on 08/25/21 with headaches, mild confusion, left  sided weakness and right gaze preference. Per reports, patient had fall in mid November when she struck her head with onset of persistent headaches. CTA/P head was negative for LVO or significant stenosis. MRI brain without acute changes or metastatic disease. EEG was showed nonspecific cortical dysfunction in right frontoparietal region and negative for seizures or epileptiform discharge. 2D echo showed EF 65-70% and mild concentric LVH.    She has had issues with paranoia,  confusion and agitation due to alcohol withdrawal. She was treated with CIWA  protocol with improvement in mentation. Headaches have improved with Fioricet.  Swallow evaluation done due to history of dysphagia and  no signs of dysphagia noted---on regular. She continues to be limited by  mild left inattention with decreased awareness of deficits and weakness. CIR recommended due to functional decline. Patient transferred to CIR on 08/30/2021 .  Pt presents with mild cognitive impairment as noted on Cognistat in areas of short term memory, mathematics, attention and insight. Pt feels she is at her cognitive baseline and attributes memory difficulties to general aging. Pt independent for use of compensatory strategies for orientation (utilizing Siri on phone which she states she does every morning). Pt able to recall 2/4 unrelated words independently, 3/4 with min A cues. Pt able to detail events of previous falls within  the year, benefits from cues to ID safety precautions and fall prevention strategies. Pt expressive/receptive language judged to be WNL for age and function. Pt's ex-husband is responsible for finances but patient is motivated to remain as independent as possible and would like to focus on medication management strategies (reports significant frustration and extra time with pill box organizing at home). Pt states she has a handyman that does all her house work but she is still driving and takes care of her dog. We discussed  supervision with complex cognitive tasks at discharge, pt currently not willing to accept help from sister so discussed an aide coming to help patient at home which was well received. Pt is tolerating a regular/thin diet with no difficulty, is taking pills whole with water one a time. Pt will benefit from skilled ST to increase safety and independence with daily routine.   Skilled Therapeutic Interventions          Pt participating in portions of Cognistat as well as non-standardized assessments of cognitive communication function.   SLP Assessment  Patient will need skilled [facility] during CIR admission    Recommendations  Patient destination: Home Follow up Recommendations: Other (comment) (intermittent supervision, supervision with complex cognitive tasks) Equipment Recommended: None recommended by SLP    SLP Frequency 3 to 5 out of 7 days   SLP Duration  SLP Intensity  SLP Treatment/Interventions 5-7 days  Minumum of 1-2 x/day, 30 to 90 minutes  Therapeutic Activities;Therapeutic Exercise;Patient/family education;Functional tasks;Cognitive remediation/compensation;Internal/external aids;Cueing hierarchy    Pain Pain Assessment Pain Scale: 0-10 Pain Score: 0-No pain  Prior Functioning Cognitive/Linguistic Baseline: Baseline deficits Baseline deficit details: Baseline cog impairments reported from OT familiar with patient Type of Home: House  Lives With: Alone Available Help at Discharge: Family;Available 24 hours/day (pt sister available to help but pt strongly opposed to discharging with her supervision. 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, systems Overall Cognitive Status: Impaired/Different from baseline Arousal/Alertness: Awake/alert Orientation Level: Oriented X4 Year: 2022 Month: January Day of Week: Correct Attention:  Sustained;Selective;Alternating Sustained Attention: Appears intact Sustained Attention Impairment: Verbal basic;Functional basic Selective Attention: Impaired Selective Attention Impairment: Verbal basic;Functional basic Memory: Impaired Memory Impairment: Retrieval deficit;Decreased recall of new information;Decreased short term memory Decreased Short Term Memory: Verbal basic;Functional basic Awareness: Impaired Awareness Impairment: Emergent impairment Problem Solving: Impaired Problem Solving Impairment: Functional complex;Verbal complex Executive Function: Organizing;Reasoning Reasoning: Impaired Reasoning Impairment: Verbal complex;Functional complex Sequencing: Appears intact Organizing: Impaired Organizing Impairment: Functional complex;Verbal complex Decision Making: Impaired Decision Making Impairment: Functional complex;Verbal complex Initiating: Appears intact Safety/Judgment: Impaired  Comprehension Auditory Comprehension Overall Auditory Comprehension: Appears within functional limits for tasks assessed Expression Expression Primary Mode of Expression: Verbal Verbal Expression Overall Verbal Expression: Appears within functional limits for tasks assessed Oral Motor Oral Motor/Sensory Function Overall Oral Motor/Sensory Function: Within functional limits Motor Speech Overall Motor Speech: Appears within functional limits for tasks assessed  Care Tool Care Tool Cognition Ability to hear (with hearing aid or hearing appliances if normally used Ability to hear (with hearing aid or hearing appliances if normally used): 0. Adequate - no difficulty in normal conservation, social interaction, listening to TV   Expression of Ideas and Wants Expression of Ideas and Wants: 3. Some difficulty - exhibits some difficulty with expressing needs and ideas (e.g, some words or finishing thoughts) or speech is not clear   Understanding Verbal and Non-Verbal Content Understanding  Verbal and Non-Verbal Content:  3. Usually understands - understands most conversations, but misses some part/intent of message. Requires cues at times to understand  Memory/Recall Ability Memory/Recall Ability : Current season;That he or she is in a hospital/hospital unit   Short Term Goals: Week 1: SLP Short Term Goal 1 (Week 1): STG = LTG d/t ELOS  Refer to Care Plan for Long Term Goals  Recommendations for other services: None   Discharge Criteria: Patient will be discharged from SLP if patient refuses treatment 3 consecutive times without medical reason, if treatment goals not met, if there is a change in medical status, if patient makes no progress towards goals or if patient is discharged from hospital.  The above assessment, treatment plan, treatment alternatives and goals were discussed and mutually agreed upon: by patient  Dewaine Conger 08/31/2021, 2:53 PM

## 2021-09-01 NOTE — Progress Notes (Signed)
PROGRESS NOTE   Subjective/Complaints:  LBM this AM- soft, but not diarrhea.  Seeing dog, sister and BF today.  Denies pain or HA- has gotten rid of HA.  Doing well- no issues.   ROS:  Pt denies SOB, abd pain, CP, N/V/C/D, and vision changes   Objective:   No results found. Recent Labs    08/31/21 0552  WBC 4.8  HGB 12.0  HCT 35.0*  PLT 191   Recent Labs    08/31/21 0552  NA 135  K 3.7  CL 105  CO2 24  GLUCOSE 101*  BUN 10  CREATININE 0.86  CALCIUM 9.1    Intake/Output Summary (Last 24 hours) at 09/01/2021 1055 Last data filed at 08/31/2021 1832 Gross per 24 hour  Intake 120 ml  Output --  Net 120 ml        Physical Exam: Vital Signs Blood pressure 137/77, pulse 88, temperature 98.4 F (36.9 C), resp. rate 14, height 5\' 2"  (1.575 m), weight 67.5 kg, SpO2 99 %.    General: awake, alert, appropriate, sitting up in bed; sweet;  NAD HENT: conjugate gaze; oropharynx moist CV: regular rate; no JVD Pulmonary: CTA B/L; no W/R/R- good air movement GI: soft, NT, ND, (+)BS Psychiatric: appropriate Neurological: alert- off topic; tangential Skin:    General: Skin is warm and dry.  Neurological:     Mental Status: She is alert.     Comments: Alert and oriented x 3. Normal insight and awareness. Intact Memory. Normal language and speech. Cranial nerve exam unremarkable--there is no visual field deficits. Mild left upper extremity weakness 4+/5. LLE 4 to 4+/5. RUE 5/5. RLE 4+/5. Some cog-wheeling weakness and occasional delays in processing motor tasks on exam L>R, sl inattention. Mild dysmetria with LUE.  Saw no focal sensory deficits although feet were hypersensitive to touch. DTR's 1+. No resting tone. No limb ataxia     Assessment/Plan: 1. Functional deficits which require 3+ hours per day of interdisciplinary therapy in a comprehensive inpatient rehab setting. Physiatrist is providing close team  supervision and 24 hour management of active medical problems listed below. Physiatrist and rehab team continue to assess barriers to discharge/monitor patient progress toward functional and medical goals  Care Tool:  Bathing              Bathing assist Assist Level: Contact Guard/Touching assist     Upper Body Dressing/Undressing Upper body dressing        Upper body assist Assist Level: Set up assist    Lower Body Dressing/Undressing Lower body dressing            Lower body assist Assist for lower body dressing: Minimal Assistance - Patient > 75%     Toileting Toileting    Toileting assist Assist for toileting: Moderate Assistance - Patient 50 - 74%     Transfers Chair/bed transfer  Transfers assist     Chair/bed transfer assist level: Contact Guard/Touching assist     Locomotion Ambulation   Ambulation assist      Assist level: Minimal Assistance - Patient > 75% Assistive device: No Device Max distance: >296ft   Walk 10 feet activity   Assist  Assist level: Contact Guard/Touching assist Assistive device: No Device   Walk 50 feet activity   Assist    Assist level: Contact Guard/Touching assist Assistive device: No Device    Walk 150 feet activity   Assist    Assist level: Contact Guard/Touching assist Assistive device: No Device    Walk 10 feet on uneven surface  activity   Assist     Assist level: Minimal Assistance - Patient > 75% Assistive device: Other (comment) (handrail)   Wheelchair     Assist Is the patient using a wheelchair?: No             Wheelchair 50 feet with 2 turns activity    Assist            Wheelchair 150 feet activity     Assist          Blood pressure 137/77, pulse 88, temperature 98.4 F (36.9 C), resp. rate 14, height 5\' 2"  (1.575 m), weight 67.5 kg, SpO2 99 %.  Medical Problem List and Plan: 1. Functional deficits secondary to metabolic encephalopathy  in the setting of post concussion syndrome. Neurology raised question of migraine variant. Pt described ocular migraines to me during my visit.             -patient may  shower             -ELOS/Goals: 7 days, supervision to mod I goals with PT and OT  Continue CIR- PT, OT and SLP  2.  Antithrombotics: -DVT/anticoagulation:  Pharmaceutical: LLovenox             -antiplatelet therapy: ASA 3. Pain Management/post-traumatic headaches: Fioricet prn 1/8- HA "gone" per pt- will con't meds prn 4. Mood: LCSW to follow for evaluation and abuse             -antipsychotic agents: N/A 5. Neuropsych: This patient is ?not fully capable of making decisions on her own behalf. 6. Skin/Wound Care: LCSW to follow for evaluation and support.  7. Fluids/Electrolytes/Nutrition: Monitor I/O. Check lytes in am.   1/7- I personally reviewed labs look good- con't to check weekly.  8. Ophthalmic migraines: Usually resolves without medication.    1/8- HA gone- will con't to monitor as how to relates to participation in therapy.  9. Lung cancer s/p  XRT/chemo: In remission. 10.Vitamin B 12 deficiency: Continue supplement.  11. Acute blood loss anemia:Mild --will recheck in am. 12. ETOH abuse: Discussed avoidance/cessation.                --continue to educate on avoidance.       LOS: 2 days A FACE TO FACE EVALUATION WAS PERFORMED  [name] [name] 09/01/2021, 10:55 AM

## 2021-09-02 DIAGNOSIS — D62 Acute posthemorrhagic anemia: Secondary | ICD-10-CM

## 2021-09-02 DIAGNOSIS — G9340 Encephalopathy, unspecified: Secondary | ICD-10-CM

## 2021-09-02 LAB — BASIC METABOLIC PANEL
Anion gap: 6 (ref 5–15)
BUN: 9 mg/dL (ref 8–23)
CO2: 27 mmol/L (ref 22–32)
Calcium: 9 mg/dL (ref 8.9–10.3)
Chloride: 106 mmol/L (ref 98–111)
Creatinine, Ser: 0.85 mg/dL (ref 0.44–1.00)
GFR, Estimated: >60 mL/min (ref >60)
Glucose, Bld: 95 mg/dL (ref 70–99)
Potassium: 3.5 mmol/L (ref 3.5–5.1)
Sodium: 139 mmol/L (ref 135–145)

## 2021-09-02 LAB — CBC
HCT: 34.6 % — ABNORMAL LOW (ref 36.0–46.0)
Hemoglobin: 11.9 g/dL — ABNORMAL LOW (ref 12.0–15.0)
MCH: 33 pg (ref 26.0–34.0)
MCHC: 34.4 g/dL (ref 30.0–36.0)
MCV: 95.8 fL (ref 80.0–100.0)
Platelets: 195 10*3/uL (ref 150–400)
RBC: 3.61 MIL/uL — ABNORMAL LOW (ref 3.87–5.11)
RDW: 11.6 % (ref 11.5–15.5)
WBC: 4.7 10*3/uL (ref 4.0–10.5)
nRBC: 0 % (ref 0.0–0.2)

## 2021-09-02 NOTE — Progress Notes (Signed)
Speech Language Pathology Daily Session Note  Patient Details  Name: [name] [name] MRN: [id number] Date of Birth: [birth year]  Today's Date: 09/02/2021 SLP Individual Time: 0915-1010 SLP Individual Time Calculation (min): 55 min  Short Term Goals: Week 1: SLP Short Term Goal 1 (Week 1): STG = LTG d/t ELOS  Skilled Therapeutic Interventions: Skilled treatment session focused on cognitive goals. Patient asking questions regarding medication administration and different organization systems for medication administration. Patient asking questions regarding pill packs. SLP provided supervision level verbal cues for patient to locate the number to call and request information from her local pharmacy. SLP also provided education regarding setting alarms and small modifications to make while organizing a pill box to maximize safety and recall throughout task. Patient verbalized understanding and utilized modifications during a BID pill organization task with supervision level verbal cues. Patient reported not wanting 24 hour assistance from family at discharge despite recommendations. Patient left in bed with alarm on and IV team present. Continue with current plan of care.      Pain No/Denies Pain   Therapy/Group: Individual Therapy  Dametria Tuzzolino 09/02/2021, 2:45 PM

## 2021-09-02 NOTE — Progress Notes (Incomplete Revision)
Physical Therapy Session Note  Patient Details  Name: [name] [name] MRN: [id number] Date of Birth: 07/16/1951  Today's Date: 09/02/2021 PT Individual Time: 1304-1400 PT Individual Time Calculation (min): 56 min   Short Term Goals: Week 1:  PT Short Term Goal 1 (Week 1): = to LTGs based on ELOS  Skilled Therapeutic Interventions/Progress Updates:     Patient in bed with CSW in the room upon PT arrival. Patient alert and agreeable to PT session. Patient denied pain during session. Patient requesting d/c home alone with intermittent check-ins from family and neighbors tomorrow or Wednesday. PT focused session on balance and gait assessment to determine safety at home and level of independence with mobility.   Therapeutic Activity: Bed Mobility: Patient performed supine to/from sit with mod I.  Transfers: Patient performed sit to/from stand with supervision-mod I without and AD throughout session.   Gait Training:  Patient ambulated >100 feet x2 and >300 feet without an AD with supervision. Ambulated with decreased gait speed, decreased step length and height, decreased arm swing, mild forward trunk lean, and downward head gaze. Provided verbal cues for erect posture, visual scanning, and increased gait speed and arm swing for balance. Performed path finding with 3 errors and poor problem solving for strategies to assist her with locating her room, before PT interjected with max cues for navigating to her room.  6 Min Walk Test:  Instructed patient to ambulate as quickly and as safely as possible for 6 minutes using LRAD. Patient was allowed to take standing rest breaks without stopping the test, but if the patient required a sitting rest break the clock would be stopped and the test would be over.  Results: *** feet (*** meters, Avg speed ***m/s) with supervision without an AD, RPE 5/10 after. Results indicate that the patient has reduced endurance with ambulation compared to age  matched norms (Females 53-69 y.o. = 538 meters).   Neuromuscular Re-ed: Patient performed the following balance assessments: Berg Balance Test Sit to Stand: Able to stand without using hands and stabilize independently Standing Unsupported: Able to stand safely 2 minutes Sitting with Back Unsupported but Feet Supported on Floor or Stool: Able to sit safely and securely 2 minutes Stand to Sit: Sits safely with minimal use of hands Transfers: Able to transfer safely, minor use of hands Standing Unsupported with Eyes Closed: Able to stand 10 seconds safely Standing Ubsupported with Feet Together: Able to place feet together independently and stand for 1 minute with supervision From Standing, Reach Forward with Outstretched Arm: Can reach forward >12 cm safely (5") From Standing Position, Pick up Object from Floor: Able to pick up shoe safely and easily From Standing Position, Turn to Look Behind Over each Shoulder: Needs supervision when turning Turn 360 Degrees: Able to turn 360 degrees safely in 4 seconds or less Standing Unsupported, Alternately Place Feet on Step/Stool: Able to stand independently and safely and complete 8 steps in 20 seconds Standing Unsupported, One Foot in Front: Able to plae foot ahead of the other independently and hold 30 seconds Standing on One Leg: Tries to lift leg/unable to hold 3 seconds but remains standing independently Total Score: 47/56 Patient demonstrates increased fall risk as noted by score of 47/56 on Berg Balance Scale.  (<36= high risk for falls, close to 100%; 37-45 significant >80%; 46-51 moderate >50%; 52-55 lower >25%)  Functional Gait  Assessment Gait Level Surface: Walks 20 ft in less than 7 sec but greater than 5.5 sec, uses assistive  device, slower speed, mild gait deviations, or deviates 6-10 in outside of the 12 in walkway width. Change in Gait Speed: Makes only minor adjustments to walking speed, or accomplishes a change in speed with  significant gait deviations, deviates 10-15 in outside the 12 in walkway width, or changes speed but loses balance but is able to recover and continue walking. Gait with Horizontal Head Turns: Performs head turns smoothly with slight change in gait velocity (eg, minor disruption to smooth gait path), deviates 6-10 in outside 12 in walkway width, or uses an assistive device. Gait with Vertical Head Turns: Performs task with slight change in gait velocity (eg, minor disruption to smooth gait path), deviates 6 - 10 in outside 12 in walkway width or uses assistive device Gait and Pivot Turn: Pivot turns safely in greater than 3 sec and stops with no loss of balance, or pivot turns safely within 3 sec and stops with mild imbalance, requires small steps to catch balance. Step Over Obstacle: Is able to step over 2 stacked shoe boxes taped together (9 in total height) without changing gait speed. No evidence of imbalance. Gait with Narrow Base of Support: Ambulates less than 4 steps heel to toe or cannot perform without assistance. Gait with Eyes Closed: Walks 20 ft, uses assistive device, slower speed, mild gait deviations, deviates 6-10 in outside 12 in walkway width. Ambulates 20 ft in less than 9 sec but greater than 7 sec. Ambulating Backwards: Walks 20 ft, uses assistive device, slower speed, mild gait deviations, deviates 6-10 in outside 12 in walkway width. Steps: Two feet to a stair, must use rail. Total Score: 17/30 Patient demonstrates increased fall risk as noted by score of 17/30 on  Functional Gait Assessment.  (<19=increased fall risk with dynamic gait)  Educated patient on safety concerns for patient to be home alone with present balance deficits and cognitive challenge of simple navigation task. Patient addiment about going home alone, reports home is fully accessible with level entry and that she has a RW, BSC, and shower chair. Will focus on household ambulation with RW for increased  independence and reduced fall risk at home during next session, patient in agreement.  Patient in bed at end of session with breaks locked, bed alarm set, and all needs within reach.   Therapy Documentation Precautions:  Precautions Precautions: Fall Precaution Comments: L neglect Restrictions Weight Bearing Restrictions: No Other Position/Activity Restrictions: Pt requires some significant cueing and direction of attention to orient to L hemibody and hemi-environment     Therapy/Group: Individual Therapy  Kallee Nam L Torsten Weniger PT, DPT  09/02/2021, 5:26 PM

## 2021-09-02 NOTE — Progress Notes (Signed)
Occupational Therapy Session Note  Patient Details  Name: [name] [name] MRN: [id number] Date of Birth: [birth year]  Session 1:  Today's Date: 09/02/2021 OT Individual Time: 6948-5462 OT Individual Time Calculation (min): 55 min   Session 2: Today's Date: 09/02/2021 OT Individual Time: 1435-1500 OT Individual Time Calculation (min): 25 min   Short Term Goals: Week 1:  OT Short Term Goal 1 (Week 1): STGs=LTGs due to ELOS  Skilled Therapeutic Interventions/Progress Updates:    Pt received supine with no c/o pain, agreeable to OT session. Pt very motivated to d/c home independently soon. Pt completed LB dressing with min cueing for fall risk reduction strategies but (S) overall. Pt completed supervision level functional mobility to the therapy gym, 100 ft. She completed item retrieval from the floor with close (S) to simulate picking up dog poop. Discussed fall risk reduction strategies throughout session. Pt completed laundry simulation tasks- reaching into washer and dryer with supervision. Min cueing for upright posture during functional mobility. She returned to her room and was left supine with all needs met, bed alarm set.   Session 2: Pt in bed with her sister [name] present, no c/o pain and agreeable to OT session focused on shower level ADLs. She completed ambulatory transfer into the bathroom with (S), no AD. Doffing of clothes, sit <> stand appropriately with good carryover of previously provided cueing. She bathed sit <> stand with set up assist. Pt reports she has a built in shower chair at home that she used for LB bathing- reinforced need to continue this. She donned a gown with min cueing for visual attention to LUE. Pt completed grooming tasks at the sink with mod I. Pt was left supine with all needs met, bed alarm set and sister present.   Therapy Documentation Precautions:  Precautions Precautions: Fall Precaution Comments: L neglect Restrictions Weight Bearing  Restrictions: No Other Position/Activity Restrictions: Pt requires some significant cueing and direction of attention to orient to L hemibody and hemi-environment   Therapy/Group: Individual Therapy  Curtis Sites 09/02/2021, 6:12 AM

## 2021-09-02 NOTE — Care Management (Signed)
Inpatient [facility] Individual Statement of Services  Patient Name:  [name] [name]  Date:  09/02/2021  Welcome to the [facility].  Our goal is to provide you with an individualized program based on your diagnosis and situation, designed to meet your specific needs.  With this comprehensive rehabilitation program, you will be expected to participate in at least 3 hours of rehabilitation therapies Monday-Friday, with modified therapy programming on the weekends.  Your rehabilitation program will include the following services:  Physical Therapy (PT), Occupational Therapy (OT), Speech Therapy (ST), 24 hour per day rehabilitation nursing, Therapeutic Recreaction (TR), Psychology, Neuropsychology, Care Coordinator, Rehabilitation Medicine, [place], and Other  Weekly team conferences will be held on Tuesdays to discuss your progress.  Your Inpatient Rehabilitation Care Coordinator will talk with you frequently to get your input and to update you on team discussions.  Team conferences with you and your family in attendance may also be held.  Expected length of stay: 5-7 days    Overall anticipated outcome: Independent with an Assistive Device  Depending on your progress and recovery, your program may change. Your Inpatient Rehabilitation Care Coordinator will coordinate services and will keep you informed of any changes. Your Inpatient Rehabilitation Care Coordinator's name and contact numbers are listed  below.  The following services may also be recommended but are not provided by the [facility] will be made to provide these services after discharge if needed.  Arrangements include referral to agencies that provide these services.  Your insurance has been verified to be:   Medicare A/B  Your primary doctor is:  Kathlene November  Pertinent information will be shared with your doctor and your insurance company.  Inpatient Rehabilitation Care Coordinator:  Cathleen Corti [phone] or [phone]  Information discussed with and copy given to patient by: Rana Snare, 09/02/2021, 11:01 AM

## 2021-09-02 NOTE — IPOC Note (Signed)
Overall Plan of Care [facility]) Patient Details Name: ATIYA YERA MRN: [id number] DOB: March 12, 1951  Admitting Diagnosis: Encephalopathy  Hospital Problems: Principal Problem:   Encephalopathy     Functional Problem List: Nursing Behavior, Bowel, Endurance, Medication Management, Perception, Safety  PT Balance, Pain, Behavior, Perception, Edema, Safety, Endurance, Sensory, Motor, Skin Integrity, Nutrition  OT Balance, Perception, Safety, Cognition, Endurance, Motor, Vision  SLP Cognition, Safety  TR         Basic ADLs: OT Bathing, Dressing, Toileting     Advanced  ADLs: OT Simple Meal Preparation, Laundry, Light Housekeeping     Transfers: PT Bed Mobility, Bed to Chair, Car, [employer], Futures trader, Tub/Shower     Locomotion: PT Ambulation, Stairs     Additional Impairments: OT    SLP Social Cognition   Problem Solving, Memory, Attention, Awareness  TR      Anticipated Outcomes Item Anticipated Outcome  Self Feeding    Swallowing      Basic self-care  mod I- supervision  Toileting  mod I   Bathroom Transfers mod I  Bowel/Bladder  min assist  Transfers  mod-I using LRAD  Locomotion  supervision using LRAD  Communication     Cognition  Supervision complex cog  Pain  n/a  Safety/Judgment  min assist and no falls   Therapy Plan: PT Intensity: Minimum of 1-2 x/day ,45 to 90 minutes PT Frequency: 5 out of 7 days PT Duration Estimated Length of Stay: ~5-7 days OT Intensity: Minimum of 1-2 x/day, 45 to 90 minutes OT Frequency: 5 out of 7 days SLP Intensity: Minumum of 1-2 x/day, 30 to 90 minutes SLP Frequency: 3 to 5 out of 7 days SLP Duration/Estimated Length of Stay: 5-7 days   Due to the current state of emergency, patients may not be receiving their 3-hours of Medicare-mandated therapy.   Team Interventions: Nursing Interventions Patient/Family Education, Bowel Management, Disease Management/Prevention, Medication Management,  Cognitive Remediation/Compensation, Discharge Planning, Psychosocial Support  PT interventions Ambulation/gait training, Cognitive remediation/compensation, Discharge planning, DME/adaptive equipment instruction, Functional mobility training, Pain management, Psychosocial support, Splinting/orthotics, Therapeutic Activities, UE/LE Strength taining/ROM, Visual/perceptual remediation/compensation, Training and development officer, Community reintegration, Disease management/prevention, Functional electrical stimulation, Neuromuscular re-education, Patient/family education, Skin care/wound management, Stair training, Therapeutic Exercise, UE/LE Coordination activities  OT Interventions Balance/vestibular training, Discharge planning, Self Care/advanced ADL retraining, Therapeutic Activities, UE/LE Coordination activities, Cognitive remediation/compensation, Functional mobility training, Disease mangement/prevention, Patient/family education, Therapeutic Exercise, Visual/perceptual remediation/compensation, Community reintegration, Engineer, drilling, Neuromuscular re-education, Psychosocial support, UE/LE Strength taining/ROM  SLP Interventions Therapeutic Activities, Therapeutic Exercise, Patient/family education, Functional tasks, Cognitive remediation/compensation, Internal/external aids, Cueing hierarchy  TR Interventions    SW/CM Interventions     Barriers to Discharge MD  Medical stability  Nursing Decreased caregiver support, Incontinence, Lack of/limited family support, Weight, Medication compliance, Behavior Lives alone in 1 level home with level entry. Family will be available intermittently at discharge.  PT Other (comments) cognitive impairments  OT      SLP Decreased caregiver support pt does not want sister to be her supervision  SW       Team Discharge Planning: Destination: PT-Home ,OT- Home , SLP-Home Projected Follow-up: PT-Outpatient PT, 24 hour  supervision/assistance, OT-  Outpatient OT, SLP-Other (comment) (intermittent supervision, supervision with complex cognitive tasks) Projected Equipment Needs: PT-To be determined, OT- To be determined, SLP-None recommended by SLP Equipment Details: PT- , OT-currently owns a shower chair, RW, and cane Patient/family involved in discharge planning: PT- Patient,  OT-Patient, SLP-Patient  MD ELOS: 5-7 days  Medical Rehab Prognosis:  Excellent Assessment: The patient has been admitted for CIR therapies with the diagnosis of encephalopathy. The team will be addressing functional mobility, strength, stamina, balance, safety, adaptive techniques and equipment, self-care, bowel and bladder mgt, patient and caregiver education, NMR, cognition, communicatoin. Goals have been set at mod I to supervision with basic self-care and mobility, cognition/safety.   Due to the current state of emergency, patients may not be receiving their 3 hours per day of Medicare-mandated therapy.    Meredith Staggers, MD, FAAPMR     See Team Conference Notes for weekly updates to the plan of care

## 2021-09-02 NOTE — Progress Notes (Addendum)
Physical Therapy Session Note  Patient Details  Name: [name] [name] MRN: [id number] Date of Birth: 11-14-1950  Today's Date: 09/02/2021 PT Individual Time: 1304-1400 PT Individual Time Calculation (min): 56 min   Short Term Goals: Week 1:  PT Short Term Goal 1 (Week 1): = to LTGs based on ELOS  Skilled Therapeutic Interventions/Progress Updates:     Patient in bed with CSW in the room upon PT arrival. Patient alert and agreeable to PT session. Patient denied pain during session. Patient requesting d/c home alone with intermittent check-ins from family and neighbors tomorrow or Wednesday. PT focused session on balance and gait assessment to determine safety at home and level of independence with mobility.   Therapeutic Activity: Bed Mobility: Patient performed supine to/from sit with mod I.  Transfers: Patient performed sit to/from stand with supervision-mod I without and AD throughout session.   Gait Training:  Patient ambulated >100 feet x2 and >300 feet without an AD with supervision. Ambulated with decreased gait speed, decreased step length and height, decreased arm swing, mild forward trunk lean, and downward head gaze. Provided verbal cues for erect posture, visual scanning, and increased gait speed and arm swing for balance. Performed path finding with 3 errors and poor problem solving for strategies to assist her with locating her room, before PT interjected with max cues for navigating to her room.  6 Min Walk Test:  Instructed patient to ambulate as quickly and as safely as possible for 6 minutes using LRAD. Patient was allowed to take standing rest breaks without stopping the test, but if the patient required a sitting rest break the clock would be stopped and the test would be over.  Results: 845 feet (257.5 meters, Avg speed 0.71 m/s) with supervision without an AD, RPE 5/10 after. Results indicate that the patient has reduced endurance with ambulation compared to age  matched norms (Females 6-69 y.o. = 538 meters).   Neuromuscular Re-ed: Patient performed the following balance assessments: Berg Balance Test Sit to Stand: Able to stand without using hands and stabilize independently Standing Unsupported: Able to stand safely 2 minutes Sitting with Back Unsupported but Feet Supported on Floor or Stool: Able to sit safely and securely 2 minutes Stand to Sit: Sits safely with minimal use of hands Transfers: Able to transfer safely, minor use of hands Standing Unsupported with Eyes Closed: Able to stand 10 seconds safely Standing Ubsupported with Feet Together: Able to place feet together independently and stand for 1 minute with supervision From Standing, Reach Forward with Outstretched Arm: Can reach forward >12 cm safely (5") From Standing Position, Pick up Object from Floor: Able to pick up shoe safely and easily From Standing Position, Turn to Look Behind Over each Shoulder: Needs supervision when turning Turn 360 Degrees: Able to turn 360 degrees safely in 4 seconds or less Standing Unsupported, Alternately Place Feet on Step/Stool: Able to stand independently and safely and complete 8 steps in 20 seconds Standing Unsupported, One Foot in Front: Able to plae foot ahead of the other independently and hold 30 seconds Standing on One Leg: Tries to lift leg/unable to hold 3 seconds but remains standing independently Total Score: 47/56 Patient demonstrates increased fall risk as noted by score of 47/56 on Berg Balance Scale.  (<36= high risk for falls, close to 100%; 37-45 significant >80%; 46-51 moderate >50%; 52-55 lower >25%)  Functional Gait  Assessment Gait Level Surface: Walks 20 ft in less than 7 sec but greater than 5.5 sec, uses  assistive device, slower speed, mild gait deviations, or deviates 6-10 in outside of the 12 in walkway width. Change in Gait Speed: Makes only minor adjustments to walking speed, or accomplishes a change in speed with  significant gait deviations, deviates 10-15 in outside the 12 in walkway width, or changes speed but loses balance but is able to recover and continue walking. Gait with Horizontal Head Turns: Performs head turns smoothly with slight change in gait velocity (eg, minor disruption to smooth gait path), deviates 6-10 in outside 12 in walkway width, or uses an assistive device. Gait with Vertical Head Turns: Performs task with slight change in gait velocity (eg, minor disruption to smooth gait path), deviates 6 - 10 in outside 12 in walkway width or uses assistive device Gait and Pivot Turn: Pivot turns safely in greater than 3 sec and stops with no loss of balance, or pivot turns safely within 3 sec and stops with mild imbalance, requires small steps to catch balance. Step Over Obstacle: Is able to step over 2 stacked shoe boxes taped together (9 in total height) without changing gait speed. No evidence of imbalance. Gait with Narrow Base of Support: Ambulates less than 4 steps heel to toe or cannot perform without assistance. Gait with Eyes Closed: Walks 20 ft, uses assistive device, slower speed, mild gait deviations, deviates 6-10 in outside 12 in walkway width. Ambulates 20 ft in less than 9 sec but greater than 7 sec. Ambulating Backwards: Walks 20 ft, uses assistive device, slower speed, mild gait deviations, deviates 6-10 in outside 12 in walkway width. Steps: Two feet to a stair, must use rail. Total Score: 17/30 Patient demonstrates increased fall risk as noted by score of 17/30 on  Functional Gait Assessment.  (<19=increased fall risk with dynamic gait)  Educated patient on safety concerns for patient to be home alone with present balance deficits and cognitive challenge of simple navigation task. Patient addiment about going home alone, reports home is fully accessible with level entry and that she has a RW, BSC, and shower chair. Will focus on household ambulation with RW for increased  independence and reduced fall risk at home during next session, patient in agreement.  Patient in bed at end of session with breaks locked, bed alarm set, and all needs within reach.   Therapy Documentation Precautions:  Precautions Precautions: Fall Precaution Comments: L neglect Restrictions Weight Bearing Restrictions: No Other Position/Activity Restrictions: Pt requires some significant cueing and direction of attention to orient to L hemibody and hemi-environment     Therapy/Group: Individual Therapy  Kishon Garriga L Marlyss Cissell PT, DPT  09/02/2021, 5:26 PM

## 2021-09-02 NOTE — Progress Notes (Signed)
Patient ID: [name] [name], female   DOB: 08-Oct-1950, 71 y.o.   MRN: [id number]  SW met with pt in room to introduce self, explain role, and discuss discharge process. SW began assessment but ended early due to scheduled PT. Pt did share she will not have anyone staying with her in her home because she does not want anyone there other than her dog. SW made efforts to negotiate potential options of someone to come into the home but efforts failed. Pt is willing to allow someone to come in and check in on her and/or bring meals. Pt is aware SW will follow-up with her sister. SW will follow-up with pt to complete assessment.   1330-SW spoke with pt sister [name] to introduce self, explain role, and discuss discharge process. She expressed a lot of concerns about pt cognition and drinking. She would like to speak with a physician as well. SW explained will follow-up tomorrow after team conference after receiving updates from team.   Loralee Pacas, MSW, [place] Office: [phone] Cell: [phone] Fax: [phone]

## 2021-09-02 NOTE — Progress Notes (Signed)
PROGRESS NOTE   Subjective/Complaints:  Pt up in gym with OT. Feels well. Anxious to get home! Thrilled to have had dog come visit this weekend!  ROS: Patient denies fever, rash, sore throat, blurred vision, nausea, vomiting, diarrhea, cough, shortness of breath or chest pain, joint or back pain, headache, or mood change.    Objective:   No results found. Recent Labs    08/31/21 0552 09/02/21 0539  WBC 4.8 4.7  HGB 12.0 11.9*  HCT 35.0* 34.6*  PLT 191 195   Recent Labs    08/31/21 0552 09/02/21 0539  NA 135 139  K 3.7 3.5  CL 105 106  CO2 24 27  GLUCOSE 101* 95  BUN 10 9  CREATININE 0.86 0.85  CALCIUM 9.1 9.0    Intake/Output Summary (Last 24 hours) at 09/02/2021 0901 Last data filed at 09/02/2021 0253 Gross per 24 hour  Intake 300 ml  Output --  Net 300 ml        Physical Exam: Vital Signs Blood pressure 120/73, pulse 84, temperature 97.9 F (36.6 C), resp. rate 20, height 5\' 2"  (1.575 m), weight 67.5 kg, SpO2 98 %.    Constitutional: No distress . Vital signs reviewed. HEENT: NCAT, EOMI, oral membranes moist Neck: supple Cardiovascular: RRR without murmur. No JVD    Respiratory/Chest: CTA Bilaterally without wheezes or rales. Normal effort    GI/Abdomen: BS +, non-tender, non-distended Ext: no clubbing, cyanosis, or edema Psych: pleasant and cooperative  Skin:    General: Skin is warm and dry.  Neurological:     Mental Status: She is alert.     Comments:  Alert and oriented x 3. Normal insight and awareness. Intact Memory. Normal language and speech. Cranial nerve exam unremarkable, tangential and verbose. Mild left upper extremity weakness 4+/5. LLE 4 to 4+/5. RUE 5/5. RLE 4+/5.  Sl left inattention.  Mild dysmetria with LUE.  Saw no focal sensory deficits although feet were hypersensitive to touch. DTR's 1+. No ataxia    Assessment/Plan: 1. Functional deficits which require 3+ hours per day  of interdisciplinary therapy in a comprehensive inpatient rehab setting. Physiatrist is providing close team supervision and 24 hour management of active medical problems listed below. Physiatrist and rehab team continue to assess barriers to discharge/monitor patient progress toward functional and medical goals  Care Tool:  Bathing              Bathing assist Assist Level: Contact Guard/Touching assist     Upper Body Dressing/Undressing Upper body dressing        Upper body assist Assist Level: Set up assist    Lower Body Dressing/Undressing Lower body dressing            Lower body assist Assist for lower body dressing: Minimal Assistance - Patient > 75%     Toileting Toileting    Toileting assist Assist for toileting: Contact Guard/Touching assist     Transfers Chair/bed transfer  Transfers assist     Chair/bed transfer assist level: Contact Guard/Touching assist     Locomotion Ambulation   Ambulation assist      Assist level: Minimal Assistance - Patient > 75% Assistive device:  No Device Max distance: >275ft   Walk 10 feet activity   Assist     Assist level: Contact Guard/Touching assist Assistive device: No Device   Walk 50 feet activity   Assist    Assist level: Contact Guard/Touching assist Assistive device: No Device    Walk 150 feet activity   Assist    Assist level: Contact Guard/Touching assist Assistive device: No Device    Walk 10 feet on uneven surface  activity   Assist     Assist level: Minimal Assistance - Patient > 75% Assistive device: Other (comment) (handrail)   Wheelchair     Assist Is the patient using a wheelchair?: No             Wheelchair 50 feet with 2 turns activity    Assist            Wheelchair 150 feet activity     Assist          Blood pressure 120/73, pulse 84, temperature 97.9 F (36.6 C), resp. rate 20, height 5\' 2"  (1.575 m), weight 67.5 kg, SpO2 98  %.  Medical Problem List and Plan: 1. Functional deficits secondary to metabolic encephalopathy in the setting of post concussion syndrome. Neurology raised question of migraine variant. Pt described ocular migraines to me during my visit.             -patient may  shower             -ELOS/Goals: 7 days, supervision to mod I goals with PT and OT  -Continue CIR therapies including PT, OT, and SLP   2.  Antithrombotics: -DVT/anticoagulation:  Pharmaceutical: LLovenox             -antiplatelet therapy: ASA 3. Pain Management/post-traumatic headaches: Fioricet prn 1/9- HA improved 4. Mood: LCSW to follow for evaluation and abuse             -antipsychotic agents: N/A 5. Neuropsych: This patient is ?not fully capable of making decisions on her own behalf. 6. Skin/Wound Care: LCSW to follow for evaluation and support.  7. Fluids/Electrolytes/Nutrition: Monitor I/O.   -I personally reviewed the patient's labs today.  WNL 8. Ophthalmic migraines: self-limited 9. Lung cancer s/p  XRT/chemo: In remission. 10.Vitamin B 12 deficiency: Continue supplement.  11. Acute blood loss anemia:Mild --hgb steady at 11.9 12. ETOH abuse: Discussed avoidance/cessation.                --continue to educate on avoidance.       LOS: 3 days A FACE TO FACE EVALUATION WAS PERFORMED  Meredith Staggers 09/02/2021, 9:01 AM

## 2021-09-03 ENCOUNTER — Other Ambulatory Visit (HOSPITAL_COMMUNITY): Payer: Self-pay

## 2021-09-03 MED ORDER — LORAZEPAM 0.5 MG PO TABS
0.5000 mg | ORAL_TABLET | Freq: Every evening | ORAL | Status: DC | PRN
  Administered 2021-09-03: 0.5 mg via ORAL
  Filled 2021-09-03: qty 1

## 2021-09-03 MED ORDER — ACETAMINOPHEN 325 MG PO TABS: 325.0000 mg | ORAL_TABLET | ORAL | Status: DC | PRN

## 2021-09-03 MED ORDER — FOLIC ACID 1 MG PO TABS
1.0000 mg | ORAL_TABLET | Freq: Every day | ORAL | 0 refills | Status: DC
  Filled 2021-09-03: qty 30, 30d supply, fill #0

## 2021-09-03 MED ORDER — THIAMINE HCL 100 MG PO TABS
100.0000 mg | ORAL_TABLET | Freq: Every day | ORAL | 0 refills | Status: DC
  Filled 2021-09-03: qty 30, 30d supply, fill #0

## 2021-09-03 MED ORDER — CYANOCOBALAMIN 1000 MCG PO TABS
ORAL_TABLET | Freq: Every day | ORAL | 0 refills | Status: DC
  Filled 2021-09-03: qty 60, 30d supply, fill #0

## 2021-09-03 MED ORDER — LORAZEPAM 0.5 MG PO TABS
0.5000 mg | ORAL_TABLET | Freq: Every evening | ORAL | 0 refills | Status: DC | PRN
  Filled 2021-09-03: qty 15, 15d supply, fill #0

## 2021-09-03 MED ORDER — THIAMINE HCL 100 MG PO TABS
100.0000 mg | ORAL_TABLET | Freq: Every day | ORAL | Status: DC
  Administered 2021-09-03 – 2021-09-04 (×2): 100 mg via ORAL
  Filled 2021-09-03 (×2): qty 1

## 2021-09-03 MED ORDER — ADULT MULTIVITAMIN W/MINERALS CH: 1.0000 | ORAL_TABLET | Freq: Every day | ORAL | Status: DC

## 2021-09-03 MED ORDER — CARVEDILOL 6.25 MG PO TABS
6.2500 mg | ORAL_TABLET | Freq: Two times a day (BID) | ORAL | 0 refills | Status: DC
  Filled 2021-09-03: qty 60, 30d supply, fill #0

## 2021-09-03 NOTE — Progress Notes (Signed)
Occupational Therapy Discharge Summary  Patient Details  Name: [name] [name] MRN: [id number] Date of Birth: 27-Mar-1951  Today's Date: 09/03/2021 OT Individual Time: 1300-1330 OT Individual Time Calculation (min): 30 min    Patient has met 10 of 10 long term goals due to improved activity tolerance, improved balance, postural control, ability to compensate for deficits, functional use of  LEFT upper extremity, improved attention, improved awareness, and improved coordination.  Patient to discharge at overall Modified Independent level.  Patient's sister will provide intermittent supervision at pt's house. [name] has made good progress on CIR. She is able to complete ADL transfers at a mod I level and has been given copious education on reducing fall risk and OT recommendations for a safe d/c, as well as a handout to ensure carryover.   Reasons goals not met: All treatment goals met.   Recommendation:  No further OT recommended.   Equipment: No equipment provided  Reasons for discharge: treatment goals met and discharge from hospital  Patient/family agrees with progress made and goals achieved: Yes  Skilled OT intervention: Pt received supine with no c/o pain. Reviewed d/c instructions and planning. Pt completed 150 ft of functional mobility with mod I. She completed dynamic catching/throwing activity with (S)- mod I. She then completed more functional mobility over uneven surfaces with close (S), progressing to mod I, including mulch and a moderate incline ramp. Pt returned to her room and was left supine with all needs met.   OT Discharge Precautions/Restrictions  Precautions Precautions: None Restrictions Weight Bearing Restrictions: No Pain Pain Assessment Pain Scale: 0-10 Pain Score: 0-No pain ADL ADL Eating: Independent Where Assessed-Eating: Chair Grooming: Modified independent Where Assessed-Grooming: Standing at sink Upper Body Bathing: Independent Where  Assessed-Upper Body Bathing: Shower Lower Body Bathing: Independent Where Assessed-Lower Body Bathing: Shower Upper Body Dressing: Independent Where Assessed-Upper Body Dressing: Edge of bed Lower Body Dressing: Modified independent Where Assessed-Lower Body Dressing: Edge of bed Toileting: Modified independent Where Assessed-Toileting: Glass blower/designer: Diplomatic Services operational officer Method: Human resources officer: Modified independent Clinical cytogeneticist Method: Clinical biochemist Method: Ambulating Vision Baseline Vision/History: 1 Wears glasses Patient Visual Report: Eye fatigue/eye pain/headache (reports opthalmic migraines) Vision Assessment?: No apparent visual deficits Perception  Perception: Within Functional Limits Praxis Praxis: Intact Cognition Overall Cognitive Status: Impaired/Different from baseline Arousal/Alertness: Awake/alert Orientation Level: Oriented X4 Year: 2023 Month: January Day of Week: Correct Attention: Selective Focused Attention: Appears intact Sustained Attention: Appears intact Selective Attention: Appears intact Alternating Attention: Impaired Alternating Attention Impairment: Verbal complex;Functional complex Memory: Impaired Memory Impairment: Retrieval deficit;Decreased recall of new information;Decreased short term memory Decreased Short Term Memory: Verbal basic;Functional basic Immediate Memory Recall: Sock;Blue;Bed Memory Recall Sock: Without Cue Memory Recall Blue: Not able to recall Memory Recall Bed: Not able to recall Awareness: Impaired Awareness Impairment: Anticipatory impairment (history of poor insight into awareness) Safety/Judgment: Impaired Comments: Slightly impaired awareness of deficits Sensation Sensation Light Touch: Appears Intact Hot/Cold: Appears Intact Proprioception: Appears Intact Stereognosis: Appears  Intact Coordination Gross Motor Movements are Fluid and Coordinated: Yes Fine Motor Movements are Fluid and Coordinated: Yes Coordination and Movement Description: Delayed righting reactions Finger Nose Finger Test: WNL BUE 9 Hole Peg Test: R hand: 30.3 seconds. L hand: 40 seconds Motor  Motor Motor: Within Functional Limits Motor - Discharge Observations: Slightly impaired balance Mobility  Bed Mobility Bed Mobility: Supine to Sit;Sit to Supine Rolling Right: Independent Rolling Left: Independent Supine to Sit: Independent Sit to Supine: Independent Transfers  Sit to Stand: Independent Stand to Sit: Independent  Trunk/Postural Assessment  Cervical Assessment Cervical Assessment: Within Functional Limits Thoracic Assessment Thoracic Assessment: Within Functional Limits Lumbar Assessment Lumbar Assessment: Within Functional Limits Postural Control Postural Control: Deficits on evaluation Righting Reactions: Delayed  Balance Balance Balance Assessed: Yes Static Sitting Balance Static Sitting - Balance Support: Feet supported Static Sitting - Level of Assistance: 6: Modified independent (Device/Increase time) Dynamic Sitting Balance Dynamic Sitting - Level of Assistance: 6: Modified independent (Device/Increase time) Static Standing Balance Static Standing - Balance Support: During functional activity Static Standing - Level of Assistance: 6: Modified independent (Device/Increase time) Dynamic Standing Balance Dynamic Standing - Balance Support: During functional activity Dynamic Standing - Level of Assistance: 6: Modified independent (Device/Increase time) Extremity/Trunk Assessment RUE Assessment RUE Assessment: Within Functional Limits LUE Assessment LUE Assessment: Within Functional Limits   Curtis Sites 09/03/2021, 8:16 AM

## 2021-09-03 NOTE — Discharge Instructions (Addendum)
Inpatient Rehab Discharge Instructions  [name] [name] Discharge date and time: 09/03/21   Activities/Precautions/ Functional Status: Activity: activity as tolerated Diet: regular diet Wound Care: Routine skin checks   Functional status:  ___ No restrictions     ___ Walk up steps independently _X__ 24/7 supervision/assistance   ___ Walk up steps with assistance ___ Intermittent supervision/assistance  ___ Bathe/dress independently ___ Walk with walker     _x__ Bathe/dress with assistance ___ Walk Independently    ___ Shower independently ___ Walk with assistance    ___ Shower with assistance _X__ No alcohol     ___ Return to work/school ________  COMMUNITY REFERRALS UPON DISCHARGE:    Outpatient: PT      ST                Agency:[facility]- [phone]             Address: [address], [place], [place] [address]                        Appointment Date/Time:*Please expect follow-up within 7-10 business days to schedule your appointment. If you have not received follow-up, be sure to contact the site directly.*    Special Instructions: No driving, smoking or alcohol 2. Need to avoid/cease ETOH use.     My questions have been answered and I understand these instructions. I will adhere to these goals and the provided educational materials after my discharge from the hospital.  Patient/Caregiver Signature _______________________________ Date __________  Clinician Signature _______________________________________ Date __________  Please bring this form and your medication list with you to all your follow-up doctor's appointments.

## 2021-09-03 NOTE — Progress Notes (Signed)
Patient ID: [name] [name], female   DOB: 07-18-1951, 71 y.o.   MRN: [id number]  SW spoke with pt sister [name] to provide updates from team conference, and pt d/c date 1/11. Pt sister was upset about the d/c date as she felt it was too soon, and also wanted to speak with a physician. SW informed that pt was not deemed incompetent so physician not able to disclose information with regard to concerns. SW encouraged pt sister to be present when pt discharges so she can receive information on discharge instructions. SW informed on outpatient PT/SLP recommended for patient. Sister does not think pt will attend, and states there are no neighbors that can take her as they are elderly (33s).   *SW met with pt in room to provide updates from team conference, and d/c date tomorrow. Pt excited. Pt was reminded on the  importance to allow others to assist her, she is not cleared to drive, and the importance of on-going therapy. Preferred outpatient location is [facility]   SW faxed outpatient PT/SLP referral to [facility].   Loralee Pacas, MSW, [place] Office: [phone] Cell: [phone] Fax: [phone]

## 2021-09-03 NOTE — Progress Notes (Signed)
Speech Language Pathology Discharge Summary  Patient Details  Name: [name] [name] MRN: [id number] Date of Birth: September 12, 1950  Today's Date: 09/03/2021 SLP Individual Time: 1415-1500 SLP Individual Time Calculation (min): 45 min   Skilled Therapeutic Interventions:  Skilled treatment session focused on cognitive goals. SLP facilitated session by providing education regarding strategies to utilize at home to maximize memory to improve recall and carryover of functional information. SLP also provided education regarding how to maintain and healthy brain lifestyle upon discharge. She verbalized understanding of all information and handouts were also given. Throughout session, supervision level verbal cues were needed for navigating her cell phone to locate specific items/apps. Patient also mildly tangential throughout session but would eventually self-monitor and return back to task/topic independently.    Patient has met 3 of 3 long term goals.  Patient to discharge at overall Supervision level.   Reasons goals not met: N/A   Clinical Impression/Discharge Summary:  Patient has made functional gains and has met 3 of 3 LTGs this admission. Currently, patient requires overall supervision level verbal cues to complete functional and complex tasks safely in regards to problem solving, recall with use of strategies and awareness. Patient education is complete and patient will discharge home with intermittent assistance from friends and family. Patient would benefit from f/u SLP services to maximize her cognitive functioning and overall functional independence in order to reduce caregiver burden.   Recommendation:  Outpatient SLP (Intermittent supervision)  Rationale for SLP Follow Up: Maximize cognitive function and independence   Equipment: N/A   Reasons for discharge: Discharged from hospital;Treatment goals met   Patient/Family Agrees with Progress Made and Goals Achieved: Yes     Kirk, [place] 09/03/2021, 3:08 PM

## 2021-09-03 NOTE — Progress Notes (Signed)
Occupational Therapy Session Note  Patient Details  Name: [name] [name] MRN: [id number] Date of Birth: 04-27-1951  Today's Date: 09/03/2021 OT Individual Time: 1115-1203 OT Individual Time Calculation (min): 48 min    Short Term Goals: Week 1:  OT Short Term Goal 1 (Week 1): STGs=LTGs due to ELOS  Skilled Therapeutic Interventions/Progress Updates:  Patient met lying supine in bed in agreement with OT treatment session. 0/10 pain reported at rest and with activity. OT treatment session with focus on community mobility, memory, problem solving, visual scanning and dual tasking. Patient completed hospital scavenger hunt using map near elevators to locate 3 destinations (gift shop, main entrance and chapel). Min cues for attention to task and question cues to remember destinations. Upon return to hospital room patient completed 3/3 parts of toileting task with supervision A. Session concluded with patient lying supine in bed with call bell within reach, bed alarm activated and all needs met.   Therapy Documentation Precautions:  Precautions Precautions: None Precaution Comments: L neglect Restrictions Weight Bearing Restrictions: No Other Position/Activity Restrictions: Pt requires some significant cueing and direction of attention to orient to L hemibody and hemi-environment General:    Therapy/Group: Individual Therapy  Teya Otterson R [place]-Davis 09/03/2021, 10:28 AM

## 2021-09-03 NOTE — Progress Notes (Signed)
Physical Therapy Discharge Summary  Patient Details  Name: [name] [name] MRN: [id number] Date of Birth: 09/06/1950  Today's Date: 09/03/2021   Patient has met 9 of 9 long term goals due to improved activity tolerance, improved balance, improved postural control, ability to compensate for deficits, improved attention, improved awareness, and improved coordination.  Patient to discharge at an ambulatory level Modified Independent for household mobility, supervision community mobility for all mobility. Patient's care partner  unavailable for family education, per CSW report the patient will have her sister and neighbors  to provide the necessary intermittent supervision assistance at discharge.  Reasons goals not met: All PT goals met.  Recommendation:  Patient will benefit from ongoing skilled PT services in outpatient setting to continue to advance safe functional mobility, address ongoing impairments in balance, activity tolerance, functional mobility, community integration, gait and stair training, dual task training, lifestyle modifications, patient/caregiver education, and minimize fall risk.  Equipment: No equipment provided; Patient declined use of RW at d/c, demonstrates safe household mobility with fall prevention strategies without AD at this time.  Reasons for discharge: treatment goals met and discharge from hospital  Patient/family agrees with progress made and goals achieved: Yes  PT Discharge Precautions/Restrictions Precautions Precautions: None;Fall Restrictions Weight Bearing Restrictions: No Pain Interference Pain Interference Pain Effect on Sleep: 1. Rarely or not at all Pain Interference with Therapy Activities: 1. Rarely or not at all Pain Interference with Day-to-Day Activities: 2. Occasionally Vision/Perception  Vision - History Ability to See in Adequate Light: 1 Impaired Vision - Assessment Eye Alignment: Within Functional Limits Alignment/Gaze  Preference: Within Defined Limits Perception Perception: Within Functional Limits Praxis Praxis: Intact  Cognition Overall Cognitive Status: History of cognitive impairments - at baseline Arousal/Alertness: Awake/alert Focused Attention: Appears intact Sustained Attention: Appears intact Selective Attention: Appears intact Alternating Attention: Impaired Alternating Attention Impairment: Verbal complex;Functional complex Memory: Impaired Memory Impairment: Retrieval deficit;Decreased recall of new information;Decreased short term memory Decreased Short Term Memory: Verbal basic;Functional basic Awareness: Impaired Awareness Impairment: Anticipatory impairment Behaviors: Impulsive Safety/Judgment: Impaired Sensation Sensation Light Touch: Appears Intact Proprioception: Appears Intact Coordination Gross Motor Movements are Fluid and Coordinated: No Fine Motor Movements are Fluid and Coordinated: Yes Coordination and Movement Description: Delayed righting reactions Motor  Motor Motor: Abnormal postural alignment and control Motor - Discharge Observations: Continued postural control deficits with significant improvement during admission  Mobility Bed Mobility Bed Mobility: Supine to Sit;Sit to Supine Rolling Right: Independent Rolling Left: Independent Supine to Sit: Independent Sit to Supine: Independent Transfers Transfers: Sit to Stand;Stand to [employer] Transfers Sit to Stand: Independent Stand to Sit: Independent Stand Pivot Transfers: Independent Locomotion  Gait Ambulation: Yes Gait Assistance: Supervision/Verbal cueing;Independent with assistive device (mod I household mobility, supervision community level with RW) Assistive device: None;Rolling walker Gait Gait: Yes Gait Pattern: Impaired Gait Pattern: Step-through pattern;Lateral hip instability;Narrow base of support Gait velocity: Decreased High Level Ambulation High Level Ambulation:  (refer to  FGA) Stairs / Additional Locomotion Stairs: Yes Stairs Assistance: Supervision/Verbal cueing Stair Management Technique: Forwards;Alternating pattern;Step to pattern;One rail Right (ascended reciprocally and descended step-to) Number of Stairs: 12 Height of Stairs: 6 Ramp: Contact Guard/touching assist Curb: Supervision/Verbal cueing (using handrail support) Wheelchair Mobility Wheelchair Mobility: No  Trunk/Postural Assessment  Cervical Assessment Cervical Assessment: Within Functional Limits Thoracic Assessment Thoracic Assessment: Within Functional Limits Lumbar Assessment Lumbar Assessment: Within Functional Limits Postural Control Postural Control: Deficits on evaluation Righting Reactions: Delayed  Balance Balance Balance Assessed: Yes Standardized Balance Assessment Standardized Balance Assessment: Functional Gait  Assessment;Berg Balance Test Berg Balance Test Sit to Stand: Able to stand without using hands and stabilize independently Standing Unsupported: Able to stand safely 2 minutes Sitting with Back Unsupported but Feet Supported on Floor or Stool: Able to sit safely and securely 2 minutes Stand to Sit: Sits safely with minimal use of hands Transfers: Able to transfer safely, minor use of hands Standing Unsupported with Eyes Closed: Able to stand 10 seconds safely Standing Ubsupported with Feet Together: Able to place feet together independently and stand for 1 minute with supervision From Standing, Reach Forward with Outstretched Arm: Can reach forward >12 cm safely (5") From Standing Position, Pick up Object from Floor: Able to pick up shoe safely and easily From Standing Position, Turn to Look Behind Over each Shoulder: Needs supervision when turning Turn 360 Degrees: Able to turn 360 degrees safely in 4 seconds or less Standing Unsupported, Alternately Place Feet on Step/Stool: Able to stand independently and safely and complete 8 steps in 20 seconds Standing  Unsupported, One Foot in Front: Able to plae foot ahead of the other independently and hold 30 seconds Standing on One Leg: Tries to lift leg/unable to hold 3 seconds but remains standing independently Total Score: 47 Static Sitting Balance Static Sitting - Level of Assistance: 6: Modified independent (Device/Increase time) Dynamic Sitting Balance Dynamic Sitting - Level of Assistance: 6: Modified independent (Device/Increase time) Static Standing Balance Static Standing - Balance Support: During functional activity Static Standing - Level of Assistance: 6: Modified independent (Device/Increase time) (CGA) Dynamic Standing Balance Dynamic Standing - Balance Support: During functional activity Dynamic Standing - Level of Assistance: 6: Modified independent (Device/Increase time) Functional Gait  Assessment Gait assessed : Yes Gait Level Surface: Walks 20 ft in less than 7 sec but greater than 5.5 sec, uses assistive device, slower speed, mild gait deviations, or deviates 6-10 in outside of the 12 in walkway width. Change in Gait Speed: Makes only minor adjustments to walking speed, or accomplishes a change in speed with significant gait deviations, deviates 10-15 in outside the 12 in walkway width, or changes speed but loses balance but is able to recover and continue walking. Gait with Horizontal Head Turns: Performs head turns smoothly with slight change in gait velocity (eg, minor disruption to smooth gait path), deviates 6-10 in outside 12 in walkway width, or uses an assistive device. Gait with Vertical Head Turns: Performs task with slight change in gait velocity (eg, minor disruption to smooth gait path), deviates 6 - 10 in outside 12 in walkway width or uses assistive device Gait and Pivot Turn: Pivot turns safely in greater than 3 sec and stops with no loss of balance, or pivot turns safely within 3 sec and stops with mild imbalance, requires small steps to catch balance. Step Over  Obstacle: Is able to step over 2 stacked shoe boxes taped together (9 in total height) without changing gait speed. No evidence of imbalance. Gait with Narrow Base of Support: Ambulates less than 4 steps heel to toe or cannot perform without assistance. Gait with Eyes Closed: Walks 20 ft, uses assistive device, slower speed, mild gait deviations, deviates 6-10 in outside 12 in walkway width. Ambulates 20 ft in less than 9 sec but greater than 7 sec. Ambulating Backwards: Walks 20 ft, uses assistive device, slower speed, mild gait deviations, deviates 6-10 in outside 12 in walkway width. Steps: Two feet to a stair, must use rail. Total Score: 17 Extremity Assessment  RLE Assessment RLE Assessment: Within Functional  Limits Active Range of Motion (AROM) Comments: WFL General Strength Comments: Grossly 4+-5/5 throughout LLE Assessment LLE Assessment: Within Functional Limits Active Range of Motion (AROM) Comments: WFL General Strength Comments: Grossly 4+-5/5 throughout    Waylynn Benefiel L Aeva Posey PT, DPT  09/03/2021, 9:53 PM

## 2021-09-03 NOTE — Progress Notes (Addendum)
PROGRESS NOTE   Subjective/Complaints:  Pt denies any pain or problems. Anxious to hear what we discuss in team conference today.   ROS: Patient denies fever, rash, sore throat, blurred vision, nausea, vomiting, diarrhea, cough, shortness of breath or chest pain, joint or back pain, headache, or mood change.    Objective:   No results found. Recent Labs    09/02/21 0539  WBC 4.7  HGB 11.9*  HCT 34.6*  PLT 195   Recent Labs    09/02/21 0539  NA 139  K 3.5  CL 106  CO2 27  GLUCOSE 95  BUN 9  CREATININE 0.85  CALCIUM 9.0    Intake/Output Summary (Last 24 hours) at 09/03/2021 1038 Last data filed at 09/03/2021 1035 Gross per 24 hour  Intake 120 ml  Output --  Net 120 ml        Physical Exam: Vital Signs Blood pressure 117/81, pulse 83, temperature 98.1 F (36.7 C), resp. rate 18, height 5\' 2"  (1.575 m), weight 67.5 kg, SpO2 100 %.    Constitutional: No distress . Vital signs reviewed. HEENT: NCAT, EOMI, oral membranes moist Neck: supple Cardiovascular: RRR without murmur. No JVD    Respiratory/Chest: CTA Bilaterally without wheezes or rales. Normal effort    GI/Abdomen: BS +, non-tender, non-distended Ext: no clubbing, cyanosis, or edema Psych: pleasant and cooperative  Skin:    General: Skin is warm and dry.  Neurological:     Mental Status: She is alert.     Comments:  Alert and oriented x 3?. Normal insight and awareness. Fair memory, selective stm deficits. . Normal language and speech. Cranial nerve exam unremarkable, tangential and verbose. Mild left upper extremity weakness 4+/5. LLE 4 to 4+/5. RUE 5/5. RLE 4+/5.  Sl left inattention.  Mild dysmetria with LUE.  Saw no focal sensory deficits although feet were hypersensitive to touch. DTR's 1+. No limb ataxia    Assessment/Plan: 1. Functional deficits which require 3+ hours per day of interdisciplinary therapy in a comprehensive inpatient  rehab setting. Physiatrist is providing close team supervision and 24 hour management of active medical problems listed below. Physiatrist and rehab team continue to assess barriers to discharge/monitor patient progress toward functional and medical goals  Care Tool:  Bathing              Bathing assist Assist Level: Contact Guard/Touching assist     Upper Body Dressing/Undressing Upper body dressing        Upper body assist Assist Level: Set up assist    Lower Body Dressing/Undressing Lower body dressing            Lower body assist Assist for lower body dressing: Minimal Assistance - Patient > 75%     Toileting Toileting    Toileting assist Assist for toileting: Contact Guard/Touching assist     Transfers Chair/bed transfer  Transfers assist     Chair/bed transfer assist level: Contact Guard/Touching assist     Locomotion Ambulation   Ambulation assist      Assist level: Minimal Assistance - Patient > 75% Assistive device: No Device Max distance: >241ft   Walk 10 feet activity   Assist  Assist level: Contact Guard/Touching assist Assistive device: No Device   Walk 50 feet activity   Assist    Assist level: Contact Guard/Touching assist Assistive device: No Device    Walk 150 feet activity   Assist    Assist level: Contact Guard/Touching assist Assistive device: No Device    Walk 10 feet on uneven surface  activity   Assist     Assist level: Minimal Assistance - Patient > 75% Assistive device: Other (comment) (handrail)   Wheelchair     Assist Is the patient using a wheelchair?: No             Wheelchair 50 feet with 2 turns activity    Assist            Wheelchair 150 feet activity     Assist          Blood pressure 117/81, pulse 83, temperature 98.1 F (36.7 C), resp. rate 18, height 5\' 2"  (1.575 m), weight 67.5 kg, SpO2 100 %.  Medical Problem List and Plan: 1. Functional  deficits secondary to metabolic encephalopathy in the setting of post concussion syndrome. Neurology raised question of migraine variant. Pt described ocular migraines to me during my visit.             -patient may  shower             -ELOS/Goals: 7 days, supervision to mod I goals with PT and OT  -Continue CIR therapies including PT, OT, and SLP. Interdisciplinary team conference today to discuss goals, barriers to discharge, and dc planning.    -discussed with pt that she does need help with certain things at home which therapies has also discussed with er  -no driving 2.  Antithrombotics: -DVT/anticoagulation:  Pharmaceutical: LLovenox             -antiplatelet therapy: ASA 3. Pain Management/post-traumatic headaches: Fioricet prn  HA's improved 4. Mood: LCSW to follow for evaluation and abuse             -antipsychotic agents: N/A 5. Neuropsych: This patient is capable of making decisions on her own behalf although she hasn't mad the best decisions in the past 6. Skin/Wound Care: LCSW to follow for evaluation and support.  7. Fluids/Electrolytes/Nutrition: Monitor I/O.   Improved labs, encouraging po 8. Ophthalmic migraines: self-limited 9. Lung cancer s/p  XRT/chemo: In remission. 10.Vitamin B 12 deficiency: Continue supplement.  11. Acute blood loss anemia:Mild --hgb steady at 11.9 12. ETOH abuse: Discussed avoidance/cessation.                -had a discussion today regarding the potential ramifications on continued etoh abuse.      At least 35 total minutes were spent in examination of patient, assessment of pertinent data,  formulation of a treatment plan, and in discussion with patient and/or family.    LOS: 4 days A FACE TO FACE EVALUATION WAS PERFORMED  Meredith Staggers 09/03/2021, 10:38 AM

## 2021-09-03 NOTE — Discharge Summary (Incomplete)
Physician Discharge Summary  Patient ID: [name] [name] MRN: [id number] DOB/AGE: 1951/04/02 71 y.o.  Admit date: 08/30/2021 Discharge date: 09/03/2021  Discharge Diagnoses:  Principal Problem:   Encephalopathy   Discharged Condition: {condition:18240}  Significant Diagnostic Studies: CT ANGIO HEAD NECK W WO CM  Result Date: 08/27/2021 CLINICAL DATA:  Left-sided neglect and weakness after fall and head injury EXAM: CT ANGIOGRAPHY HEAD AND NECK CT PERFUSION BRAIN TECHNIQUE: Multidetector CT imaging of the head and neck was performed using the standard protocol during bolus administration of intravenous contrast. Multiplanar CT image reconstructions and MIPs were obtained to evaluate the vascular anatomy. Carotid stenosis measurements (when applicable) are obtained utilizing NASCET criteria, using the distal internal carotid diameter as the denominator. Multiphase CT imaging of the brain was performed following IV bolus contrast injection. Subsequent parametric perfusion maps were calculated using RAPID software. CONTRAST:  159mL OMNIPAQUE IOHEXOL 350 MG/ML SOLN COMPARISON:  Recent CT and MR imaging FINDINGS: CT HEAD FINDINGS Brain: There is no acute intracranial hemorrhage, mass effect, or edema. No new loss of gray-white differentiation. Ventricles and sulci are stable in size and configuration. Patchy hypoattenuation in the supratentorial white matter is nonspecific but may reflect minor chronic microvascular ischemic changes. Vascular: No new finding. Skull: Unremarkable. Sinuses/Orbits: No acute abnormality. Other: Mastoid air cells are clear. Review of the MIP images confirms the above findings CTA NECK FINDINGS Aortic arch: Great vessel origins are patent. No proximal subclavian stenosis. Right carotid system: Patent. Minor plaque at the ICA origin without stenosis. Left carotid system: Patent. Calcified plaque at the bifurcation. No stenosis. Vertebral arteries: Patent.  Codominant.  No  stenosis. Skeleton: Mild cervical spine degenerative changes. Other neck: Unremarkable. Upper chest: Partially imaged scarring right lung. Review of the MIP images confirms the above findings CTA HEAD FINDINGS Anterior circulation: Intracranial internal carotid arteries are patent with mild plaque and atherosclerotic irregularity. 1 mm inferiorly directed outpouching from the distal supraclinoid right ICA. Anterior and middle cerebral arteries are patent. Posterior circulation: Intracranial vertebral arteries are patent. Basilar artery is patent. Major cerebellar artery origins are patent. Posterior cerebral arteries are patent. Venous sinuses: As permitted by contrast timing, patent. Review of the MIP images confirms the above findings CT Brain Perfusion Findings: CBF (<30%) Volume: 73mL Perfusion (Tmax>6.0s) volume: 46mL Mismatch Volume: 9mL Infarction Location: None IMPRESSION: No acute intracranial abnormality. No large vessel occlusion or hemodynamically significant stenosis. 1 mm aneurysm or infundibulum of the distal supraclinoid right ICA. Perfusion imaging is partially degraded by artifact but is unremarkable. Electronically Signed   By: Macy Mis M.D.   On: 08/27/2021 08:37   CT HEAD WO CONTRAST  Result Date: 08/25/2021 CLINICAL DATA:  Neuro deficit, acute, stroke suspected EXAM: CT HEAD WITHOUT CONTRAST TECHNIQUE: Contiguous axial images were obtained from the base of the skull through the vertex without intravenous contrast. COMPARISON:  MRI head 09/16/2011 BRAIN: BRAIN Cerebral ventricle sizes are concordant with the degree of cerebral volume loss. No evidence of large-territorial acute infarction. No parenchymal hemorrhage. No mass lesion. No extra-axial collection. No mass effect or midline shift. No hydrocephalus. Basilar cisterns are patent. Vascular: No hyperdense vessel. Skull: No acute fracture or focal lesion. Sinuses/Orbits: Paranasal sinuses and mastoid air cells are clear. The orbits  are unremarkable. Other: None. IMPRESSION: No acute intracranial abnormality. Electronically Signed   By: Iven Finn M.D.   On: 08/25/2021 16:47   MR BRAIN W WO CONTRAST  Result Date: 08/26/2021 CLINICAL DATA:  Altered mental status EXAM: MRI HEAD WITHOUT AND  WITH CONTRAST TECHNIQUE: Multiplanar, multiecho pulse sequences of the brain and surrounding structures were obtained without and with intravenous contrast. CONTRAST:  7.57mL GADAVIST GADOBUTROL 1 MMOL/ML IV SOLN COMPARISON:  Head CT from yesterday FINDINGS: Brain: No acute infarction, hemorrhage, hydrocephalus, extra-axial collection or mass lesion. Cerebral volume loss which is generalized. No unexpected white matter changes or abnormal enhancement. Vascular: Normal flow voids. Skull and upper cervical spine: Normal marrow signal. Sinuses/Orbits: Negative. IMPRESSION: 1. No acute or reversible finding. No evidence of metastatic disease. 2. Cerebral atrophy. Electronically Signed   By: Jorje Guild M.D.   On: 08/26/2021 04:44   CT CEREBRAL PERFUSION W CONTRAST  Result Date: 08/27/2021 CLINICAL DATA:  Left-sided neglect and weakness after fall and head injury EXAM: CT ANGIOGRAPHY HEAD AND NECK CT PERFUSION BRAIN TECHNIQUE: Multidetector CT imaging of the head and neck was performed using the standard protocol during bolus administration of intravenous contrast. Multiplanar CT image reconstructions and MIPs were obtained to evaluate the vascular anatomy. Carotid stenosis measurements (when applicable) are obtained utilizing NASCET criteria, using the distal internal carotid diameter as the denominator. Multiphase CT imaging of the brain was performed following IV bolus contrast injection. Subsequent parametric perfusion maps were calculated using RAPID software. CONTRAST:  122mL OMNIPAQUE IOHEXOL 350 MG/ML SOLN COMPARISON:  Recent CT and MR imaging FINDINGS: CT HEAD FINDINGS Brain: There is no acute intracranial hemorrhage, mass effect, or edema. No  new loss of gray-white differentiation. Ventricles and sulci are stable in size and configuration. Patchy hypoattenuation in the supratentorial white matter is nonspecific but may reflect minor chronic microvascular ischemic changes. Vascular: No new finding. Skull: Unremarkable. Sinuses/Orbits: No acute abnormality. Other: Mastoid air cells are clear. Review of the MIP images confirms the above findings CTA NECK FINDINGS Aortic arch: Great vessel origins are patent. No proximal subclavian stenosis. Right carotid system: Patent. Minor plaque at the ICA origin without stenosis. Left carotid system: Patent. Calcified plaque at the bifurcation. No stenosis. Vertebral arteries: Patent.  Codominant.  No stenosis. Skeleton: Mild cervical spine degenerative changes. Other neck: Unremarkable. Upper chest: Partially imaged scarring right lung. Review of the MIP images confirms the above findings CTA HEAD FINDINGS Anterior circulation: Intracranial internal carotid arteries are patent with mild plaque and atherosclerotic irregularity. 1 mm inferiorly directed outpouching from the distal supraclinoid right ICA. Anterior and middle cerebral arteries are patent. Posterior circulation: Intracranial vertebral arteries are patent. Basilar artery is patent. Major cerebellar artery origins are patent. Posterior cerebral arteries are patent. Venous sinuses: As permitted by contrast timing, patent. Review of the MIP images confirms the above findings CT Brain Perfusion Findings: CBF (<30%) Volume: 6mL Perfusion (Tmax>6.0s) volume: 65mL Mismatch Volume: 59mL Infarction Location: None IMPRESSION: No acute intracranial abnormality. No large vessel occlusion or hemodynamically significant stenosis. 1 mm aneurysm or infundibulum of the distal supraclinoid right ICA. Perfusion imaging is partially degraded by artifact but is unremarkable. Electronically Signed   By: Macy Mis M.D.   On: 08/27/2021 08:37   DG Chest Portable 1  View  Result Date: 08/25/2021 CLINICAL DATA:  Weakness.  Confusion.  History of lung carcinoma. EXAM: PORTABLE CHEST 1 VIEW COMPARISON:  03/29/2012.  Chest CT, 01/29/2021. FINDINGS: Post treatment scarring extends superiorly from the right hilum, with volume loss in the right upper lobe. Remainder of the lungs is clear. No pleural effusion or pneumothorax. Cardiac silhouette is normal in size. No mediastinal or hilar masses. Skeletal structures are grossly intact. IMPRESSION: 1. No acute cardiopulmonary disease. Electronically Signed   By:  Lajean Manes M.D.   On: 08/25/2021 16:43   EEG adult  Result Date: 08/27/2021 Lora Havens, MD     08/27/2021 11:55 AM Patient Name: [name] [name] MRN: [id number] Epilepsy Attending: Lora Havens Referring Physician/Provider: Dr Donnetta Simpers Date: 08/27/2021 Duration: 21.38 mins Patient history: 71 year old female with headache, left hemianopsia left extinction and left hemiparesis.  EEG to evaluate for seizure. Level of alertness: Awake AEDs during EEG study: None Technical aspects: This EEG study was done with scalp electrodes positioned according to the 10-20 International system of electrode placement. Electrical activity was acquired at a sampling rate of 500Hz  and reviewed with a high frequency filter of 70Hz  and a low frequency filter of 1Hz . EEG data were recorded continuously and digitally stored. Description: The posterior dominant rhythm consists of 8 Hz activity of moderate voltage (25-35 uV) seen predominantly in posterior head regions, symmetric and reactive to eye opening and eye closing.  Drowsiness was characterized by attenuation of posterior dominant rhythm.  EEG showed continuous low amplitude 3 to 6 Hz theta -delta slowing in her right frontotemporal region.  Hyperventilation and photic stimulation were not performed.   ABNORMALITY - Continuous slow, right frontotemporal region IMPRESSION: This study is suggestive of cortical  dysfunction arising from  right frontotemporal region, nonspecific etiology but could be secondary to underlying structural abnormality, postictal state. No seizures or definite epileptiform discharges were seen throughout the recording. Priyanka Barbra Sarks   Overnight EEG with video  Result Date: 08/28/2021 Lora Havens, MD     08/29/2021 10:06 AM Patient Name: [name] [name] MRN: [id number] Epilepsy Attending: Lora Havens Referring Physician/Provider: Dr Donnetta Simpers Duration: 08/27/2021 1142 to 08/28/2021 1142  Patient history: 71 year old female with headache, left hemianopsia left extinction and left hemiparesis.  EEG to evaluate for seizure.  Level of alertness: Awake, asleep  AEDs during EEG study: None  Technical aspects: This EEG study was done with scalp electrodes positioned according to the 10-20 International system of electrode placement. Electrical activity was acquired at a sampling rate of 500Hz  and reviewed with a high frequency filter of 70Hz  and a low frequency filter of 1Hz . EEG data were recorded continuously and digitally stored.  Description: The posterior dominant rhythm consists of 8 Hz activity of moderate voltage (25-35 uV) seen predominantly in posterior head regions, symmetric and reactive to eye opening and eye closing.  Sleep was characterized by vertex waves, sleep spindles (12 to 14 Hz), maximal frontocentral region. EEG showed continuous low amplitude 3 to 6 Hz theta -delta slowing in her right frontotemporal region.  Hyperventilation and photic stimulation were not performed.    ABNORMALITY - Continuous slow, right frontotemporal region  IMPRESSION: This study is suggestive of cortical dysfunction arising from  right frontotemporal region, nonspecific etiology but could be secondary to underlying structural abnormality, postictal state. No seizures or definite epileptiform discharges were seen throughout the recording.   Lora Havens   ECHOCARDIOGRAM  COMPLETE  Result Date: 08/27/2021    ECHOCARDIOGRAM REPORT   Patient Name:   [name] [name] Bayard Date of Exam: 08/27/2021 Medical Rec #:  [id number]               Height:       62.0 in Accession #:    [id number]              Weight:       157.8 lb Date of Birth:  Jun 15, 1951  BSA:          1.729 m Patient Age:    41 years                BP:           136/91 mmHg Patient Gender: F                       HR:           114 bpm. Exam Location:  Inpatient Procedure: 2D Echo, Cardiac Doppler, Color Doppler and Intracardiac            Opacification Agent Indications:    Stroke  History:        Patient has no prior history of Echocardiogram examinations.  Sonographer:    Merrie Roof RDCS Referring Phys: [id number] [place]  1. Left ventricular ejection fraction, by estimation, is 65 to 70%. The left ventricle has hyperdynamic function. The left ventricle has no regional wall motion abnormalities. There is mild concentric left ventricular hypertrophy. Left ventricular diastolic parameters are indeterminate.  2. Right ventricular systolic function is normal. The right ventricular size is normal. Tricuspid regurgitation signal is inadequate for assessing PA pressure.  3. The mitral valve is normal in structure. No evidence of mitral valve regurgitation. No evidence of mitral stenosis.  4. The aortic valve has an indeterminant number of cusps. Aortic valve regurgitation is not visualized. Aortic valve sclerosis is present, with no evidence of aortic valve stenosis.  5. The inferior vena cava is normal in size with greater than 50% respiratory variability, suggesting right atrial pressure of 3 mmHg. Comparison(s): No prior Echocardiogram. FINDINGS  Left Ventricle: Left ventricular ejection fraction, by estimation, is 65 to 70%. The left ventricle has hyperdynamic function. The left ventricle has no regional wall motion abnormalities. Definity contrast agent was given IV to delineate the left ventricular  endocardial borders. The left ventricular internal cavity size was small. There is mild concentric left ventricular hypertrophy. Left ventricular diastolic parameters are indeterminate. Right Ventricle: The right ventricular size is normal. No increase in right ventricular wall thickness. Right ventricular systolic function is normal. Tricuspid regurgitation signal is inadequate for assessing PA pressure. Left Atrium: Left atrial size was normal in size. Right Atrium: Right atrial size was normal in size. Pericardium: There is no evidence of pericardial effusion. Mitral Valve: The mitral valve is normal in structure. No evidence of mitral valve regurgitation. No evidence of mitral valve stenosis. Tricuspid Valve: The tricuspid valve is normal in structure. Tricuspid valve regurgitation is not demonstrated. No evidence of tricuspid stenosis. Aortic Valve: The aortic valve has an indeterminant number of cusps. Aortic valve regurgitation is not visualized. Aortic valve sclerosis is present, with no evidence of aortic valve stenosis. Pulmonic Valve: The pulmonic valve was normal in structure. Pulmonic valve regurgitation is not visualized. No evidence of pulmonic stenosis. Aorta: The aortic root is normal in size and structure. Venous: The inferior vena cava is normal in size with greater than 50% respiratory variability, suggesting right atrial pressure of 3 mmHg. IAS/Shunts: No atrial level shunt detected by color flow Doppler.  LEFT VENTRICLE PLAX 2D LVIDd:         3.20 cm   Diastology LVIDs:         2.40 cm   LV e' lateral:   8.92 cm/s LV PW:         1.20 cm   LV E/e' lateral: 6.8 LV IVS:  1.30 cm LVOT diam:     2.00 cm LV SV:         49 LV SV Index:   28 LVOT Area:     3.14 cm  RIGHT VENTRICLE RV Basal diam:  2.80 cm LEFT ATRIUM           Index        RIGHT ATRIUM           Index LA diam:      3.10 cm 1.79 cm/m   RA Area:     13.40 cm LA Vol (A4C): 41.6 ml 24.07 ml/m  RA Volume:   29.60 ml  17.12 ml/m   AORTIC VALVE LVOT Vmax:   106.00 cm/s LVOT Vmean:  67.600 cm/s LVOT VTI:    0.155 m  AORTA Ao Root diam: 3.00 cm MITRAL VALVE MV Area (PHT): 6.37 cm    SHUNTS MV Decel Time: 119 msec    Systemic VTI:  0.16 m MV E velocity: 60.80 cm/s  Systemic Diam: 2.00 cm MV A velocity: 92.20 cm/s MV E/A ratio:  0.66 Kardie Tobb DO Electronically signed by Berniece Salines DO Signature Date/Time: 08/27/2021/2:57:07 PM    Final     Labs:  Basic Metabolic Panel: Recent Labs  Lab 08/31/21 0552 09/02/21 0539  NA 135 139  K 3.7 3.5  CL 105 106  CO2 24 27  GLUCOSE 101* 95  BUN 10 9  CREATININE 0.86 0.85  CALCIUM 9.1 9.0    CBC: Recent Labs  Lab 08/31/21 0552 09/02/21 0539  WBC 4.8 4.7  NEUTROABS 3.4  --   HGB 12.0 11.9*  HCT 35.0* 34.6*  MCV 95.4 95.8  PLT 191 195    CBG: Recent Labs  Lab 08/28/21 2152  GLUCAP 105*    Brief HPI:   [name] [name] is a 71 y.o. female ***   Hospital Course: [name] [name] was admitted to rehab 08/30/2021 for inpatient therapies to consist of PT, ST and OT at least three hours five days a week. Past admission physiatrist, therapy team and rehab RN have worked together to provide customized collaborative inpatient rehab.   Blood pressures were monitored on TID basis and   Diabetes has been monitored with ac/hs CBG checks and SSI was use prn for tighter BS control.    Rehab course: During patient's stay in rehab weekly team conferences were held to monitor patient's progress, set goals and discuss barriers to discharge. At admission, patient required supervision to [facility] for ADL tasks and CGA to min assist with mobility. Mild cognitive  impairment noted on ST evaluation. She  has had improvement in activity tolerance, balance, postural control as well as ability to compensate for deficits.   She requires superivision to complete functional tasks.     Disposition:  There are no questions and answers to display.         Diet:  Special  Instructions:  Discharge Instructions     Ambulatory referral to Neurology   Complete by: As directed    An appointment is requested in approximately: 8 weeks metabolic encephalopathy/postconcussive syndrome.   Ambulatory referral to Physical Medicine Rehab   Complete by: As directed    Moderate complexity follow-up 1 to 2 weeks metabolic encephalopathy postconcussive syndrome      Allergies as of 09/03/2021       Reactions   Mosquito (diagnostic)    Tramadol Hcl Nausea And Vomiting   Codeine Nausea And Vomiting   Penicillins Rash  Medication List     STOP taking these medications    Moderna COVID-19 Vaccine 100 MCG/0.5ML injection Generic drug: COVID-19 mRNA vaccine (Moderna)   ondansetron 8 MG disintegrating tablet Commonly known as: ZOFRAN-ODT       TAKE these medications    acetaminophen 325 MG tablet Commonly known as: TYLENOL Take 1-2 tablets (325-650 mg total) by mouth every 4 (four) hours as needed for mild pain.   aspirin EC 81 MG tablet Take 81 mg daily by mouth.   butalbital-acetaminophen-caffeine 50-325-40 MG tablet Commonly known as: FIORICET Take 1 tablet by mouth every 6 (six) hours as needed for headache.   carvedilol 6.25 MG tablet Commonly known as: COREG Take 1 tablet (6.25 mg total) by mouth 2 (two) times daily with a meal.   citalopram 20 MG tablet Commonly known as: CELEXA Take 1 tablet (20 mg total) by mouth daily.   Cyanocobalamin 2500 MCG Chew Chew 2 tablets by mouth daily.   folic acid 1 MG tablet Commonly known as: FOLVITE Take 1 tablet (1 mg total) by mouth daily.   multivitamin with minerals Tabs tablet Take 1 tablet by mouth daily. Start taking on: September 04, 2021   raloxifene 60 MG tablet Commonly known as: EVISTA Take 1 tablet (60 mg total) by mouth daily.   Repatha SureClick 545 MG/ML Soaj Generic drug: Evolocumab INJECT 140 MG( 1 ML) UNDER THE SKIN EVERY 14 DAYS What changed: See the new  instructions.   rosuvastatin 40 MG tablet Commonly known as: CRESTOR Take 1 tablet (40 mg total) by mouth at bedtime.   senna-docusate 8.6-50 MG tablet Commonly known as: Senokot-S Take 1 tablet by mouth at bedtime as needed for mild constipation.   thiamine 100 MG tablet Take 1 tablet (100 mg total) by mouth daily. Start taking on: September 04, 2021   Vitamin D3 50 MCG (2000 UT) capsule Take 4,000 Units by mouth daily.        Follow-up Information     Meredith Staggers, MD Follow up.   Specialty: Physical Medicine and Rehabilitation Why: Office to call for appointment Contact information: [address] [place] [address] [phone]                 Signed: Bary Leriche 09/03/2021, 2:49 PM

## 2021-09-03 NOTE — Progress Notes (Signed)
Occupational Therapy Session Note  Patient Details  Name: [name] [name] MRN: [id number] Date of Birth: 1951/03/29  Today's Date: 09/03/2021 OT Individual Time: 4944-9675 OT Individual Time Calculation (min): 75 min    Short Term Goals: Week 1:  OT Short Term Goal 1 (Week 1): STGs=LTGs due to ELOS  Skilled Therapeutic Interventions/Progress Updates:    Pt supine with no c/o pain. Agreeable to OT session. Pt completed dressing from EOB with mod I, good carryover of previously provided safety cueing. Oral care completed in standing with mod I. She completed 150 ft of functional mobility to the therapy gym with mod I. She completed several dynamic standing balance activities with (S) for higher level demands, challenging BOS and functional reaching. Testing completed for d/c, including 9 hole peg test and MMT. No visual concerns other than baseline ophthalmic migraines. Pt completed dynamic stepping forward/backward activity with focus on stabilizing within narrow BOS. BITS system used to assess subtle L attention- 1 second slower on both upper and lower L quadrants and very mild L inattention on the bell cancellation task. Discussed implications and inability to drive until she receives MD clearance to do so. Pt returned to her room and transferred back to bed. She was given a fresh cup of coffee and left supine with MD in room. Bed alarm set.   Therapy Documentation Precautions:  Precautions Precautions: Fall Precaution Comments: L neglect Restrictions Weight Bearing Restrictions: No Other Position/Activity Restrictions: Pt requires some significant cueing and direction of attention to orient to L hemibody and hemi-environment Therapy/Group: Individual Therapy  Curtis Sites 09/03/2021, 6:18 AM

## 2021-09-03 NOTE — Patient Care Conference (Signed)
Inpatient RehabilitationTeam Conference and Plan of Care Update Date: 09/03/2021   Time: 10:00 AM    Patient Name: [name] [name]      Medical Record Number: [id number]  Date of Birth: 1951/07/06 Sex: Female         Room/Bed: 4M08C/4M08C-01 Payor Info: Payor: MEDICARE / Plan: MEDICARE PART A AND B / Product Type: *No Product type* /    Admit Date/Time:  08/30/2021  3:06 PM  Primary Diagnosis:  Encephalopathy  Hospital Problems: Principal Problem:   Encephalopathy    Expected Discharge Date: Expected Discharge Date: 09/04/21  Team Members Present: Physician leading conference: Dr. Alger Simons Social Worker Present: Loralee Pacas, [place] Nurse Present: Dorthula Nettles, RN PT Present: Canary Brim, PT OT Present: Laverle Hobby, OT SLP Present: Weston Anna, SLP PPS Coordinator present : Gunnar Fusi, SLP     Current Status/Progress Goal Weekly Team Focus  Bowel/Bladder   cont of b/b lbm 1/9  remain cont of b/b  assess q shift and prn   Swallow/Nutrition/ Hydration             ADL's   mod I overall for ADLS, toileting, and transfers. Reduced awareness.  Mod I overall, (S) cognition goals  Cognitive retraining, ADL retraining, balance, d/c planning   Mobility   Supervision with and without RW, near mod I with RW for household level  Mod I household mobility, supervision community mobility with LRAD  Functional mobility, activity tolerance, gait and stair training, balance, AD assessment for RW, safety awareness, patient/caregiver education   Communication             Safety/Cognition/ Behavioral Observations  Supervision-Min A  Supervision  complex problem solving, recall of functional information, anticipatory awareness   Pain   no complaints of pain  pain <3  assess q shift and prn   Skin   skin intact  no new breakdown  assess q shift and prn     Discharge Planning:  Patient to d/c to home. Unsure on amount of support pt will have due to patient  resistance for someone to stay in the home. She is currenly only willing to allow someone to check in/bring food to her.   Team Discussion: Left side weakness, falls, chemo/radiation therapy history. ETOH abuse. Labs good, no seizure activity. Incontinent bowel. Patient and sister don't have a good relationship but sister will have to take patient to appointments due to no driving at discharge. Impulsive and forgetful. Can be non-compliant with medications at times. Personality is very independent, doesn't want anyone in the home. Home is very accessible, neighbors can assist and check on her at least twice a day. SLP has reviewed medications and schedule with patient.  Patient on target to meet rehab goals: yes, mod I to supervision goals overall.  *See Care Plan and progress notes for long and short-term goals.   Revisions to Treatment Plan:  Finalizing discharge plans and medications.  Teaching Needs: Family education, medication management, safety awareness, transfer/gait training, etc.  Current Barriers to Discharge: Decreased caregiver support, Incontinence, Lack of/limited family support, Medication compliance, and Behavior  Possible Resolutions to Barriers: Family education Follow-up PT/SLP outpatient therapy Family and neighbors to provide intermittent supervision  Time toileting schedule for incontinence     Medical Summary Current Status: TBI, hx of ETOH abuse. still with STM, impulsive. no pain currently  Barriers to Discharge: Medical stability   Possible Resolutions to [employer] Focus: daily assessment of labs and patient data, education re: injury and  etoh abuse   Continued Need for Acute Rehabilitation Level of Care: The patient requires daily medical management by a physician with specialized training in physical medicine and rehabilitation for the following reasons: Direction of a multidisciplinary physical rehabilitation program to maximize functional  independence : Yes Medical management of patient stability for increased activity during participation in an intensive rehabilitation regime.: Yes Analysis of laboratory values and/or radiology reports with any subsequent need for medication adjustment and/or medical intervention. : Yes   I attest that I was present, lead the team conference, and concur with the assessment and plan of the team.   Dorthula Nettles G 09/03/2021, 1:09 PM

## 2021-09-04 ENCOUNTER — Other Ambulatory Visit (HOSPITAL_COMMUNITY): Payer: Self-pay

## 2021-09-04 DIAGNOSIS — I10 Essential (primary) hypertension: Secondary | ICD-10-CM

## 2021-09-04 NOTE — Progress Notes (Signed)
PROGRESS NOTE   Subjective/Complaints:  Pt in good spirits. "Look how well i'm doing"  excited about dc today  ROS: Patient denies fever, rash, sore throat, blurred vision, nausea, vomiting, diarrhea, cough, shortness of breath or chest pain, joint or back pain, headache, or mood change.    Objective:   No results found. Recent Labs    09/02/21 0539  WBC 4.7  HGB 11.9*  HCT 34.6*  PLT 195   Recent Labs    09/02/21 0539  NA 139  K 3.5  CL 106  CO2 27  GLUCOSE 95  BUN 9  CREATININE 0.85  CALCIUM 9.0    Intake/Output Summary (Last 24 hours) at 09/04/2021 0828 Last data filed at 09/03/2021 1855 Gross per 24 hour  Intake 300 ml  Output --  Net 300 ml        Physical Exam: Vital Signs Blood pressure 127/87, pulse 86, temperature 98.1 F (36.7 C), resp. rate 14, height 5\' 2"  (1.575 m), weight 67.5 kg, SpO2 99 %.    Constitutional: No distress . Vital signs reviewed. HEENT: NCAT, EOMI, oral membranes moist Neck: supple Cardiovascular: RRR without murmur. No JVD    Respiratory/Chest: CTA Bilaterally without wheezes or rales. Normal effort    GI/Abdomen: BS +, non-tender, non-distended Ext: no clubbing, cyanosis, or edema Psych: pleasant and cooperative  Skin:    General: Skin is warm and dry.  Neurological:     Mental Status: She is alert.     Comments:  Alert and oriented x 3--recalled date, knew she was leaving today. . Reasonable insight and awareness. Fair memory, selective stm deficits. . Normal language and speech. Cranial nerve exam unremarkable, tangential and verbose. Mild left upper extremity weakness 4+ to 5/5. LLE 4 to 4+/5. RUE 5/5. RLE 4+/5.  Sl left inattention.  Mild dysmetria with LUE.  Sensory exam normal for light touch and pain in all 4 limbs. No limb ataxia or cerebellar signs. No abnormal tone appreciated.      Assessment/Plan: 1. Functional deficits which require 3+ hours per day  of interdisciplinary therapy in a comprehensive inpatient rehab setting. Physiatrist is providing close team supervision and 24 hour management of active medical problems listed below. Physiatrist and rehab team continue to assess barriers to discharge/monitor patient progress toward functional and medical goals  Care Tool:  Bathing    Body parts bathed by patient: Left arm, Left lower leg, Right arm, Right lower leg, Abdomen, Chest, Face, Front perineal area, Buttocks, Left upper leg, Right upper leg         Bathing assist Assist Level: Independent with assistive device Assistive Device Comment: shower chair   Upper Body Dressing/Undressing Upper body dressing   What is the patient wearing?: Pull over shirt    Upper body assist Assist Level: Independent    Lower Body Dressing/Undressing Lower body dressing      What is the patient wearing?: Pants     Lower body assist Assist for lower body dressing: Independent     Toileting Toileting    Toileting assist Assist for toileting: Independent     Transfers Chair/bed transfer  Transfers assist     Chair/bed transfer  assist level: Independent     Locomotion Ambulation   Ambulation assist      Assist level: Supervision/Verbal cueing Assistive device: No Device Max distance: 845 ft   Walk 10 feet activity   Assist     Assist level: Independent Assistive device: No Device   Walk 50 feet activity   Assist    Assist level: Independent Assistive device: No Device    Walk 150 feet activity   Assist    Assist level: Supervision/Verbal cueing Assistive device: No Device    Walk 10 feet on uneven surface  activity   Assist     Assist level: Supervision/Verbal cueing Assistive device: Other (comment) (handrail)   Wheelchair     Assist Is the patient using a wheelchair?: No             Wheelchair 50 feet with 2 turns activity    Assist            Wheelchair 150  feet activity     Assist          Blood pressure 127/87, pulse 86, temperature 98.1 F (36.7 C), resp. rate 14, height 5\' 2"  (1.575 m), weight 67.5 kg, SpO2 99 %.  Medical Problem List and Plan: 1. Functional deficits secondary to metabolic encephalopathy in the setting of post concussion syndrome. Neurology raised question of migraine variant. Pt described ocular migraines to me during my visit.             -dc home today  -f/u with pcp, [facility]  -discussed no driving, safety, etoh abstinence, etc 2.  Antithrombotics: -DVT/anticoagulation:  Pharmaceutical: LLovenox             -antiplatelet therapy: ASA 3. Pain Management/post-traumatic headaches: Fioricet prn  HA's improved 4. Mood: can go home on 0.5mg  ativan at night to help sleep  -also suggested melatonin at HS             -antipsychotic agents: N/A 5. Neuropsych: This patient is capable of making decisions on her own behalf although she hasn't mad the best decisions in the past 6. Skin/Wound Care: LCSW to follow for evaluation and support.  7. Fluids/Electrolytes/Nutrition: Monitor I/O.   Improved labs, encouraging po 8. Ophthalmic migraines: self-limited 9. Lung cancer s/p  XRT/chemo: In remission. 10.Vitamin B 12 deficiency: Continue supplement.  11. Acute blood loss anemia:Mild --hgb steady at 11.9 12. ETOH abuse: Have discussed avoidance/cessation.                          LOS: 5 days A FACE TO FACE EVALUATION WAS PERFORMED  [name] [name] 09/04/2021, 8:28 AM

## 2021-09-04 NOTE — Progress Notes (Signed)
PA Dan in with pt/family to discuss discharge instructions. Pt/family in agreement. Meds returned to family/pt. Belongings gathered. Questions and needs addressed. Pt left ambulatory to private vehicle. No complications noted. Sheela Stack, LPN

## 2021-09-04 NOTE — Progress Notes (Signed)
Inpatient Rehabilitation Care Coordinator Discharge Note   Patient Details  Name: [name] [name] MRN: [id number] Date of Birth: November 08, 1950   Discharge location: D/c to home  Length of Stay: 4 days  Discharge activity level: Modified Independent for household mobility, supervision community mobility for all mobility.  Home/community participation: Limited  Patient response YT:WKMQKM Literacy - How often do you need to have someone help you when you read instructions, pamphlets, or other written material from your doctor or pharmacy?: Never  Patient response MN:OTRRNH Isolation - How often do you feel lonely or isolated from those around you?: Rarely  Services provided included: MD, RD, PT, OT, SLP, RN, CM, Neuropsych, SW, Pharmacy, TR  Financial Services:  Financial Services Utilized: Medicare [place] medicare supplement  Choices offered to/list presented to: Yes  Follow-up services arranged:  Outpatient    Outpatient Servicies: [facility] for PT/SLP   Patient response to transportation need: Is the patient able to respond to transportation needs?: Yes In the past 12 months, has lack of transportation kept you from medical appointments or from getting medications?: No In the past 12 months, has lack of transportation kept you from meetings, work, or from getting things needed for daily living?: No   Comments (or additional information):  Patient/Family verbalized understanding of follow-up arrangements:  Yes  Individual responsible for coordination of the follow-up plan: contact pt  Confirmed correct DME delivered: Rana Snare 09/04/2021    Rana Snare

## 2021-09-04 NOTE — Progress Notes (Signed)
Inpatient Rehabilitation Discharge Medication Review by a Pharmacist  A complete drug regimen review was completed for this patient to identify any potential clinically significant medication issues.  High Risk Drug Classes Is patient taking? Indication by Medication  Antipsychotic No   Anticoagulant No   Antibiotic No   Opioid Yes Fioricet for pain and headaches/migraines  Antiplatelet Yes ASA for primary prophx of CAD with other risk factors? No clear indication  Hypoglycemics/insulin No   Vasoactive Medication Yes Coreg for HTN  Chemotherapy No   Other No      Type of Medication Issue Identified Description of Issue Recommendation(s)  Drug Interaction(s) (clinically significant)     Duplicate Therapy     Allergy     No Medication Administration End Date     Incorrect Dose     Additional Drug Therapy Needed     Significant med changes from prior encounter (inform family/care partners about these prior to discharge).  D/c metoprolol and lisinopril as on inpatient discharge summary  Other       Clinically significant medication issues were identified that warrant physician communication and completion of prescribed/recommended actions by midnight of the next day:  No  Time spent performing this drug regimen review (minutes):  53min  Keyonte Cookston S. Alford Highland, PharmD, BCPS Clinical Staff Pharmacist [email or web address] [place] 09/04/2021 6:55 AM

## 2021-09-04 NOTE — Progress Notes (Signed)
Inpatient Rehabilitation Care Coordinator Assessment and Plan Patient Details  Name: [name] [name] MRN: [id number] Date of Birth: [birth year]  Today's Date: 09/04/2021  Hospital Problems: Principal Problem:   Encephalopathy Active Problems:   Essential hypertension   COPD (chronic obstructive pulmonary disease) (HCC)   Slow transit constipation   Benign essential HTN  Past Medical History:  Past Medical History:  Diagnosis Date   Anemia    duering cancer treatment    Anxiety    Arthritis    OA hands    Bilateral lower extremity pain    Blood transfusion without reported diagnosis    during chemo for lung cancer   Cataract    forming  but very small    Cholelithiasis    Colon polyps    adenomatous   High cholesterol    History of chemotherapy    for lung cancer- completed 2013   Hx of radiation therapy 10/07/11 to 11/20/11   right lung   Hx of radiation therapy 01/07/2012- 01/21/12   cranial irradiation   Hypertension    Lung cancer ([place]) 09/19/11   Stage III currently in remission   Malignant melanoma of skin of canthus of right eye ([place]) 02/2017   Osteoporosis 04/2014   Dexa scan by Dr. Corinna Capra, T-score -2.7, next in 2 years, with at least 13 % major fracture in 10 years   Pitting edema    Bilateral lower extremities, duplex ultrasound 04/03/2015 normal, no DVT   Past Surgical History:  Past Surgical History:  Procedure Laterality Date   CHOLECYSTECTOMY  04/2011   biliary stent placement   COLONOSCOPY     INCISIONAL HERNIA REPAIR  2015   MOHS SURGERY Right 02/2017   cheek and eyelid   Josph Macho Touch     Vaginal procedure   POLYPECTOMY     TUBAL LIGATION     Social History:  reports that she quit smoking about 9 years ago. Her smoking use included cigarettes. She has never used smokeless tobacco. She reports current alcohol use. She reports that she does not use drugs.  Family / Support Systems Marital Status: Divorced How Long?: 2012. Pt has been  divorced twice. Patient Roles: Parent Spouse/Significant Other: Divorced Children: 2 adult sons. One son passed  due to drug overdose. Other Supports: neighbors Anticipated Caregiver: sister Ability/Limitations of Caregiver: Pt will not allow sister or anyone to stay in teh home with her. Caregiver Availability: Intermittent Family Dynamics: Pt lives alone with her dog.  Social History Preferred language: English Religion: Christian Cultural Background: Patient worked in Press photographer until retirement in 2021 Education: junior in Medical sales representative - How often do you need to have someone help you when you read instructions, pamphlets, or other written material from your doctor or pharmacy?: Never Writes: Yes Employment Status: Retired Date Retired/Disabled/Unemployed: 2021 Age Retired: 68 Public relations account executive Issues: Denies Guardian/Conservator: N/A   Abuse/Neglect Abuse/Neglect Assessment Can Be Completed: Yes Physical Abuse: Denies Verbal Abuse: Denies Sexual Abuse: Denies Exploitation of patient/patient's resources: Denies Self-Neglect: Denies  Patient response to: Social Isolation - How often do you feel lonely or isolated from those around you?: Never  Emotional Status Pt's affect, behavior and adjustment status: Pt in good spirits at time of visit Recent Psychosocial Issues: Denies Psychiatric History: Pt admits to a hx of anxiety in which her PCP prescribes Ativan for bedtime. Pt states she has not received any counseling, only mediation at time of divorce. Substance Abuse History: Pt admits she quit smoking  cigarettes when she was informed abotu stage 3 lung cancer. She also admits to a daily bottle of boxed wine per day. Denies rec drug use.  Patient / Family Perceptions, Expectations & Goals Pt/Family understanding of illness & functional limitations: patient has some understanding of her care needs. Pt sister understands pt care needs Premorbid pt/family  roles/activities: Independent Anticipated changes in roles/activities/participation: Assistance with ADLs/IADLs Pt/family expectations/goals: balance and mobility  [employer]: None Premorbid Home Care/DME Agencies: None Transportation available at discharge: sister Is the patient able to respond to transportation needs?: Yes In the past 12 months, has lack of transportation kept you from medical appointments or from getting medications?: No In the past 12 months, has lack of transportation kept you from meetings, work, or from getting things needed for daily living?: No Resource referrals recommended: Neuropsychology  Discharge Planning Living Arrangements: Alone Support Systems: Other relatives, Friends/neighbors Type of Residence: Private residence Insurance Resources: [employer], Multimedia programmer (specify) Financial Resources: Radio broadcast assistant Screen Referred: No Living Expenses: Own Money Management: Patient Does the patient have any problems obtaining your medications?: No Home Management: Patient reports she manages all homecare needs, and eats out often. Patient/Family Preliminary Plans: No changes Care Coordinator Barriers to Discharge: Decreased caregiver support, Lack of/limited family support Care Coordinator Anticipated Follow Up Needs: HH/OP  Clinical Impression Pt is not a veteran. NO HCPOA. DME: cane and shower seat.   Denessa Cavan A Barbra Sarks 09/04/2021, 5:19 PM

## 2021-09-05 ENCOUNTER — Telehealth: Payer: Self-pay

## 2021-09-05 NOTE — Telephone Encounter (Signed)
Transition Care Management Follow-up Telephone Call Date of discharge and from where: 09/04/2021-[facility] How have you been since you were released from the hospital? Doing ok Any questions or concerns? No  Items Reviewed: Did the pt receive and understand the discharge instructions provided? Yes  Medications obtained and verified? Yes  Other? Yes  Any new allergies since your discharge? No  Dietary orders reviewed? Yes Do you have support at home? Yes   Home Care and Equipment/Supplies: Were home health services ordered? no If so, what is the name of the agency? N/a  Has the agency set up a time to come to the patient's home? not applicable Were any new equipment or medical supplies ordered?  No What is the name of the medical supply agency? N/a Were you able to get the supplies/equipment? not applicable Do you have any questions related to the use of the equipment or supplies? N/a  Functional Questionnaire: (I = Independent and D = Dependent) ADLs: I  Bathing/Dressing- I  Meal Prep- I  Eating- I  Maintaining continence- I  Transferring/Ambulation- I  Managing Meds- I with assistance  Follow up appointments reviewed:  PCP Hospital f/u appt confirmed? Yes  Scheduled to see Dr. Larose Kells on 09/17/2021 @ 1:40. [facility] f/u appt confirmed? Yes  Scheduled to see Danella Sensing on 09/18/2021  @ 9:40. Are transportation arrangements needed? No  If their condition worsens, is the pt aware to call PCP or go to the Emergency Dept.? Yes Was the patient provided with contact information for the PCP's office or ED? Yes Was to pt encouraged to call back with questions or concerns? Yes

## 2021-09-06 ENCOUNTER — Telehealth: Payer: Self-pay

## 2021-09-06 NOTE — Telephone Encounter (Signed)
Transition Care Management Unsuccessful Follow-up Telephone Call  Date of discharge and from where:  09/03/21 from [facility]  Attempts:  1st Attempt  Reason for unsuccessful TCM follow-up call:  Unable to reach patient; Discharge summary not available

## 2021-09-09 NOTE — Discharge Summary (Signed)
Physician Discharge Summary  Patient ID: [name] [name] MRN: [id number] DOB/AGE: Feb 28, 1951 71 y.o.  Admit date: 08/30/2021 Discharge date: 09/04/2021  Discharge Diagnoses:  Principal Problem:   Post concussion syndrome Active Problems:   Essential hypertension   COPD (chronic obstructive pulmonary disease) (HCC)   Benign essential HTN   History of ETOH abuse   Vitamin B 12 deficiency   Post-traumatic headache   Insomnia   Discharged Condition: stable  Significant Diagnostic Studies: N/a   Labs:  Basic Metabolic Panel: BMP Latest Ref Rng & Units 09/02/2021 08/31/2021 08/27/2021  Glucose 70 - 99 mg/dL 95 101(H) 111(H)  BUN 8 - 23 mg/dL 9 10 10   Creatinine 0.44 - 1.00 mg/dL 0.85 0.86 0.92  Sodium 135 - 145 mmol/L 139 135 136  Potassium 3.5 - 5.1 mmol/L 3.5 3.7 3.5  Chloride 98 - 111 mmol/L 106 105 106  CO2 22 - 32 mmol/L 27 24 23   Calcium 8.9 - 10.3 mg/dL 9.0 9.1 8.4(L)     CBC: CBC Latest Ref Rng & Units 09/02/2021 08/31/2021 08/27/2021  WBC 4.0 - 10.5 K/uL 4.7 4.8 5.9  Hemoglobin 12.0 - 15.0 g/dL 11.9(L) 12.0 11.7(L)  Hematocrit 36.0 - 46.0 % 34.6(L) 35.0(L) 35.1(L)  Platelets 150 - 400 K/uL 195 191 159     CBG: No results for input(s): GLUCAP in the last 168 hours.  Brief HPI:   [name] [name] is a 71 y.o. female is a 71 year old female with history of SCLC, B12 deficiency, chronic low back pain, anxiety disorder, alcohol abuse who was admitted on 08/25/2021 with headaches, mild confusion, left-sided weakness and right gaze preference.  Per reports patient had fall in mid November when she struck her head with onset of presented headache.  CTA/P head was negative for LVO or significant stenosis.  MRI brain was negative for acute changes of metastatic disease.  EEG was negative for seizures or epileptiform discharges.  She did have issues with paranoia, confusion and agitation due to alcohol withdrawal and was treated with CIWA protocol.  Her mentation was  improving and headaches were managed with addition of Fioricet.  She continued to be limited by left inattention with decrease in safety awareness as well as weakness.  CIR was recommended due to functional decline.   Hospital Course: [name] [name] was admitted to rehab 08/30/2021 for inpatient therapies to consist of PT, ST and OT at least three hours five days a week. Past admission physiatrist, therapy team and rehab RN have worked together to provide customized collaborative inpatient rehab.  Her blood pressures were monitored on TID basis and have been stable.  She was maintained on B12 supplements during her stay.  Posttraumatic headaches were improving.  She has had issues with insomnia despite addition of melatonin.  Low-dose Ativan has been use as needed to help manage anxiety as insomnia. Follow-up CBC shows acute blood loss anemia is stable.  Repeat check of BUN shows that electrolytes and renal status to be within normal limits.  Supervision is recommended for safety after discharge.  She has also been educated extensively about cessation and avoidance of alcohol use as well as not driving past discharge.  She will continue to receive follow up outpatient PT and ST at [place] [facility].    Rehab course: During patient's stay in rehab weekly team conferences were held to monitor patient's progress, set goals and discuss barriers to discharge. At admission, patient required supervision to contact-guard assist with basic self-care task  and contact-guard to min assist with mobility.  She exhibited mild cognitive impairments affecting short-term memory, math, attention and insight. She  has had improvement in activity tolerance, balance, postural control as well as ability to compensate for deficits. She is able to complete ADL tasks at modified independent level. She is modified independent for transfers and to ambulate household distance. She requires supervision with RW in  community setting. She requires supervision with verbal cues to complete functional and complex tasks and  for recall to use compensatory strategies. Family education has been completed with sister.   Discharge disposition: 01-Home or Self Care  Diet: Limit carbs  Special Instructions: No driving till cleared by MD Needs to avoid/cease ETOH use.    Discharge Instructions     Ambulatory referral to Neurology   Complete by: As directed    An appointment is requested in approximately: 8 weeks metabolic encephalopathy/postconcussive syndrome.   Ambulatory referral to Physical Medicine Rehab   Complete by: As directed    Moderate complexity follow-up 1 to 2 weeks metabolic encephalopathy postconcussive syndrome   Ambulatory referral to Physical Therapy   Complete by: As directed    Eval and treat   Ambulatory referral to Speech Therapy   Complete by: As directed    Eval and treat      Allergies as of 09/04/2021       Reactions   Mosquito (diagnostic)    Tramadol Hcl Nausea And Vomiting   Codeine Nausea And Vomiting   Penicillins Rash        Medication List     STOP taking these medications    Cyanocobalamin 2500 MCG Chew Replaced by: vitamin B-12 1000 MCG tablet   Moderna COVID-19 Vaccine 100 MCG/0.5ML injection Generic drug: COVID-19 mRNA vaccine (Moderna)   ondansetron 8 MG disintegrating tablet Commonly known as: ZOFRAN-ODT       TAKE these medications    acetaminophen 325 MG tablet Commonly known as: TYLENOL Take 1-2 tablets (325-650 mg total) by mouth every 4 (four) hours as needed for mild pain.   aspirin EC 81 MG tablet Take 81 mg daily by mouth.   butalbital-acetaminophen-caffeine 50-325-40 MG tablet Commonly known as: FIORICET Take 1 tablet by mouth every 6 (six) hours as needed for headache.   carvedilol 6.25 MG tablet Commonly known as: COREG Take 1 tablet (6.25 mg total) by mouth 2 (two) times daily with a meal.   citalopram 20 MG  tablet Commonly known as: CELEXA Take 1 tablet (20 mg total) by mouth daily.   folic acid 1 MG tablet Commonly known as: FOLVITE Take 1 tablet (1 mg total) by mouth daily.   LORazepam 0.5 MG tablet--Rx# 15 pills Commonly known as: ATIVAN Take 1 tablet (0.5 mg total) by mouth at bedtime as needed for anxiety or sleep.   multivitamin with minerals Tabs tablet Take 1 tablet by mouth daily.   raloxifene 60 MG tablet Commonly known as: EVISTA Take 1 tablet (60 mg total) by mouth daily.   Repatha SureClick 564 MG/ML Soaj Generic drug: Evolocumab INJECT 140 MG( 1 ML) UNDER THE SKIN EVERY 14 DAYS What changed: See the new instructions.   rosuvastatin 40 MG tablet Commonly known as: CRESTOR Take 1 tablet (40 mg total) by mouth at bedtime.   senna-docusate 8.6-50 MG tablet Commonly known as: Senokot-S Take 1 tablet by mouth at bedtime as needed for mild constipation.   thiamine 100 MG tablet Take 1 tablet (100 mg total) by mouth daily.  vitamin B-12 1000 MCG tablet Commonly known as: CYANOCOBALAMIN Take 2 tablets by mouth daily. Replaces: Cyanocobalamin 2500 MCG Chew   Vitamin D3 50 MCG (2000 UT) capsule Take 4,000 Units by mouth daily.        Follow-up Information     Meredith Staggers, MD Follow up.   Specialty: Physical Medicine and Rehabilitation Why: Office to call for appointment Contact information: [address] [place] [address] [phone]         Colon Branch, MD. Call.   Specialty: Internal Medicine Contact information: [address] [place] [address] [phone]         Colon Branch, MD Follow up.   Specialty: Internal Medicine Contact information: [place] [address] [place] [address] [phone]                 Signed: Bary Leriche 09/11/2021, 10:17 PM

## 2021-09-10 ENCOUNTER — Telehealth: Payer: Self-pay

## 2021-09-10 NOTE — Telephone Encounter (Signed)
Transition Care Management Unsuccessful Follow-up Telephone Call  Date of discharge and from where:  09/04/21  Attempts:  2nd Attempt  Reason for unsuccessful TCM follow-up call:  Unable to reach patient; Discharge summary not done

## 2021-09-11 DIAGNOSIS — G4700 Insomnia, unspecified: Secondary | ICD-10-CM

## 2021-09-11 DIAGNOSIS — F1020 Alcohol dependence, uncomplicated: Secondary | ICD-10-CM

## 2021-09-11 DIAGNOSIS — F1011 Alcohol abuse, in remission: Secondary | ICD-10-CM

## 2021-09-11 DIAGNOSIS — F1090 Alcohol use, unspecified, uncomplicated: Secondary | ICD-10-CM

## 2021-09-11 DIAGNOSIS — E538 Deficiency of other specified B group vitamins: Secondary | ICD-10-CM

## 2021-09-11 DIAGNOSIS — G44309 Post-traumatic headache, unspecified, not intractable: Secondary | ICD-10-CM

## 2021-09-17 ENCOUNTER — Ambulatory Visit (INDEPENDENT_AMBULATORY_CARE_PROVIDER_SITE_OTHER): Payer: Medicare Other | Admitting: Internal Medicine

## 2021-09-17 ENCOUNTER — Encounter: Payer: Self-pay | Admitting: Internal Medicine

## 2021-09-17 VITALS — BP 126/90 | HR 90 | Temp 98.5°F | Resp 16 | Ht 62.0 in | Wt 153.0 lb

## 2021-09-17 DIAGNOSIS — Z23 Encounter for immunization: Secondary | ICD-10-CM

## 2021-09-17 DIAGNOSIS — R4182 Altered mental status, unspecified: Secondary | ICD-10-CM

## 2021-09-17 DIAGNOSIS — F0781 Postconcussional syndrome: Secondary | ICD-10-CM

## 2021-09-17 DIAGNOSIS — F1010 Alcohol abuse, uncomplicated: Secondary | ICD-10-CM | POA: Diagnosis not present

## 2021-09-17 DIAGNOSIS — E785 Hyperlipidemia, unspecified: Secondary | ICD-10-CM

## 2021-09-17 MED ORDER — CITALOPRAM HYDROBROMIDE 20 MG PO TABS: 20.0000 mg | ORAL_TABLET | Freq: Every day | ORAL | 1 refills | Status: DC

## 2021-09-17 MED ORDER — HYDROXYZINE HCL 10 MG PO TABS: 10.0000 mg | ORAL_TABLET | Freq: Every evening | ORAL | 0 refills | Status: DC | PRN

## 2021-09-17 NOTE — Progress Notes (Signed)
Subjective:    Patient ID: [name] [name], female    DOB: 12/12/1950, 71 y.o.   MRN: [id number]  DOS:  09/17/2021 Type of visit - description: TCM 14 Admitted to acute care from January 1 for 5 days. Presented to the ER with altered mental status, left-sided weakness, headache. Symptoms a started 3 weeks prior to admission after she had a fall. CT and brain MRI showed atrophy but no acute changes. CTA  head with no major stenosis. EEG with no seizure activity. Encephalopathy: Intermittent confusion, history of EtOH abuse. She was eventually transferred to the in-house rehab service. She was discharged home 09/04/2021.  Review of Systems Since she left the hospital she is at home. Her sister supervise her, sister keeps  Ativan and has not provided to her. Denies headache, nausea or vomiting. Is still drinking, 2 glasses of wine every night. Thinks carvedilol is giving her side effects. Emotionally she feels well but is upset because she was told she cannot drive.  Past Medical History:  Diagnosis Date   Anemia    duering cancer treatment    Anxiety    Arthritis    OA hands    Bilateral lower extremity pain    Blood transfusion without reported diagnosis    during chemo for lung cancer   Cataract    forming  but very small    Cholelithiasis    Colon polyps    adenomatous   High cholesterol    History of chemotherapy    for lung cancer- completed 2013   Hx of radiation therapy 10/07/11 to 11/20/11   right lung   Hx of radiation therapy 01/07/2012- 01/21/12   cranial irradiation   Hypertension    Lung cancer ([place]) 09/19/11   Stage III currently in remission   Malignant melanoma of skin of canthus of right eye ([place]) 02/2017   Osteoporosis 04/2014   Dexa scan by Dr. Corinna Capra, T-score -2.7, next in 2 years, with at least 13 % major fracture in 10 years   Pitting edema    Bilateral lower extremities, duplex ultrasound 04/03/2015 normal, no DVT    Past Surgical History:   Procedure Laterality Date   CHOLECYSTECTOMY  04/2011   biliary stent placement   COLONOSCOPY     INCISIONAL HERNIA REPAIR  2015   MOHS SURGERY Right 02/2017   cheek and eyelid   Josph Macho Touch     Vaginal procedure   POLYPECTOMY     TUBAL LIGATION      Current Outpatient Medications  Medication Instructions   acetaminophen (TYLENOL) 325-650 mg, Oral, Every 4 hours PRN   aspirin EC 81 mg, Oral, Daily   carvedilol (COREG) 6.25 mg, Oral, 2 times daily with meals   citalopram (CELEXA) 20 mg, Oral, Daily   cyanocobalamin 1000 MCG tablet Take 2 tablets by mouth daily.   folic acid (FOLVITE) 1 mg, Oral, Daily   hydrOXYzine (ATARAX) 10-20 mg, Oral, At bedtime PRN   Multiple Vitamin (MULTIVITAMIN WITH MINERALS) TABS tablet 1 tablet, Oral, Daily   raloxifene (EVISTA) 60 mg, Oral, Daily   REPATHA SURECLICK 782 MG/ML SOAJ INJECT 140 MG( 1 ML) UNDER THE SKIN EVERY 14 DAYS   rosuvastatin (CRESTOR) 40 mg, Oral, Daily at bedtime   senna-docusate (SENOKOT-S) 8.6-50 MG tablet 1 tablet, Oral, At bedtime PRN   thiamine 100 mg, Oral, Daily   Vitamin D3 4,000 Units, Oral, Daily       Objective:   Physical Exam BP 126/90 (BP  Location: Left Arm, Patient Position: Sitting, Cuff Size: Small)    Pulse 90    Temp 98.5 F (36.9 C) (Oral)    Resp 16    Ht 5\' 2"  (1.575 m)    Wt 153 lb (69.4 kg)    SpO2 98%    BMI 27.98 kg/m  General:   Well developed, NAD, BMI noted. HEENT:  Normocephalic . Face symmetric, atraumatic Lungs:  CTA B Normal respiratory effort, no intercostal retractions, no accessory muscle use. Heart: RRR,  no murmur.  Lower extremities: no pretibial edema bilaterally  Skin: Not pale. Not jaundice Neurologic:  alert & oriented X3.  Speech normal, gait appropriate for age and unassisted Psych--  Cognition and judgment appear intact.  Cooperative with normal attention span and concentration.  Behavior appropriate. No anxious or depressed appearing.      Assessment     ASSESSMENT  HTN Hyperlipidemia -- cramps with Lipitor dc 2015 Depression . Anxiety -- on citalopram, used xanax before, re start if needed Osteoporosis-- DEXAs  at gyn --->  was on boniva, now evista  B12 deficiency dx 01/2014 Vit D  def  COPD: Mild last PFTs  CV: Extensive coronary artery calcifications Oncology: -Lung cancer SCC DX 2013, XRT chest and brain -melanoma 2018, R face, s/p Mohs Lower extremity edema [place] (-) for DVT 03-2015 Coronary calcifications per CT chest 6 2016 IBS, diarrhea predominant   on Imodium as needed; GI 2015-- rx questran, self d/c d/t constipation ASA intolerant (tinnitus) but ok  taking low-dose Admitted 07-2020: Had a fall, L pubic rami fracture Admitted 08-2021: Fall, headache, EtOH, MS changes  PLAN: TCM 43 Fall, head contusion, mental status changes, EtOH abuse: Was admitted to the hospital early this month, she had MS changes, history of a head contusion, heavy alcohol abuse.   W/U (-)  for acute stroke After acute admission,  subsequently went to rehab and now she is home. Etiology of symptoms not completely clear, likely postconcussion and alcohol abuse. States she is back to normal and feeling well. She still drinks 2 drinks daily and has no plans to quit.  Patient is educated regards that. She is upset because she cannot drive. Plan: Remove Ativan from her medication list.  She was not taking it prior to admission, I believe was Rx at the hospital for DTs. Encouraged to take all vitamins including thiamine, she is not taking them because is too many pills Think she has side effect from carvedilol ("vivid dreams").  Rx to take last carvedilol dose daily in the afternoon. Educated regards EtOH.  States she will not stop. Will get a CMP and CBC. Add Atarax for insomnia. Stop Fiorinal, does not have headaches anymore. High cholesterol: On Crestor and Repatha, per cardiology. Flu shot today RTC 3 months     This visit occurred during the  SARS-CoV-2 public health emergency.  Safety protocols were in place, including screening questions prior to the visit, additional usage of staff PPE, and extensive cleaning of exam room while observing appropriate contact time as indicated for disinfecting solutions.

## 2021-09-17 NOTE — Patient Instructions (Signed)
°  GO TO THE LAB : Get the blood work     [name] [name], [place] back for a checkup in 3 months

## 2021-09-18 ENCOUNTER — Encounter: Payer: Medicare Other | Admitting: Registered Nurse

## 2021-09-18 LAB — CBC WITH DIFFERENTIAL/PLATELET
Basophils Absolute: 0 10*3/uL (ref 0.0–0.1)
Basophils Relative: 0.5 % (ref 0.0–3.0)
Eosinophils Absolute: 0.2 10*3/uL (ref 0.0–0.7)
Eosinophils Relative: 2.4 % (ref 0.0–5.0)
HCT: 39.2 % (ref 36.0–46.0)
Hemoglobin: 12.8 g/dL (ref 12.0–15.0)
Lymphocytes Relative: 10 % — ABNORMAL LOW (ref 12.0–46.0)
Lymphs Abs: 0.7 10*3/uL (ref 0.7–4.0)
MCHC: 32.8 g/dL (ref 30.0–36.0)
MCV: 96.4 fl (ref 78.0–100.0)
Monocytes Absolute: 0.6 10*3/uL (ref 0.1–1.0)
Monocytes Relative: 8.6 % (ref 3.0–12.0)
Neutro Abs: 5.4 10*3/uL (ref 1.4–7.7)
Neutrophils Relative %: 78.5 % — ABNORMAL HIGH (ref 43.0–77.0)
Platelets: 194 10*3/uL (ref 150.0–400.0)
RBC: 4.06 Mil/uL (ref 3.87–5.11)
RDW: 12.5 % (ref 11.5–15.5)
WBC: 6.9 10*3/uL (ref 4.0–10.5)

## 2021-09-18 LAB — COMPREHENSIVE METABOLIC PANEL
ALT: 18 U/L (ref 0–35)
AST: 24 U/L (ref 0–37)
Albumin: 4.3 g/dL (ref 3.5–5.2)
Alkaline Phosphatase: 52 U/L (ref 39–117)
BUN: 14 mg/dL (ref 6–23)
CO2: 29 mEq/L (ref 19–32)
Calcium: 9.8 mg/dL (ref 8.4–10.5)
Chloride: 100 mEq/L (ref 96–112)
Creatinine, Ser: 0.98 mg/dL (ref 0.40–1.20)
GFR: 59.27 mL/min — ABNORMAL LOW (ref >60.00)
Glucose, Bld: 90 mg/dL (ref 70–99)
Potassium: 4.6 mEq/L (ref 3.5–5.1)
Sodium: 138 mEq/L (ref 135–145)
Total Bilirubin: 0.6 mg/dL (ref 0.2–1.2)
Total Protein: 6.6 g/dL (ref 6.0–8.3)

## 2021-09-18 NOTE — Assessment & Plan Note (Addendum)
TCM 16 Fall, head contusion, mental status changes, EtOH abuse: Was admitted to the hospital early this month, she had MS changes, history of a head contusion, heavy alcohol abuse.   W/U (-)  for acute stroke After acute admission,  subsequently went to rehab and now she is home. Etiology of symptoms not completely clear, likely postconcussion and alcohol abuse. States she is back to normal and feeling well. She still drinks 2 drinks daily and has no plans to quit.  Patient is educated regards that. She is upset because she cannot drive. Plan: Remove Ativan from her medication list.  She was not taking it prior to admission, I believe was Rx at the hospital for DTs. Encouraged to take all vitamins including thiamine, she is not taking them because is too many pills Think she has side effect from carvedilol ("vivid dreams").  Rx to take last carvedilol dose daily in the afternoon. Educated regards EtOH.  States she will not stop. Will get a CMP and CBC. Add Atarax for insomnia. Stop Fiorinal, does not have headaches anymore. High cholesterol: On Crestor and Repatha, per cardiology. Flu shot today RTC 3 months

## 2021-09-28 ENCOUNTER — Other Ambulatory Visit: Payer: Self-pay | Admitting: Internal Medicine

## 2021-10-04 ENCOUNTER — Other Ambulatory Visit: Payer: Self-pay

## 2021-10-04 ENCOUNTER — Encounter (HOSPITAL_BASED_OUTPATIENT_CLINIC_OR_DEPARTMENT_OTHER): Payer: Self-pay | Admitting: Emergency Medicine

## 2021-10-04 ENCOUNTER — Emergency Department (HOSPITAL_BASED_OUTPATIENT_CLINIC_OR_DEPARTMENT_OTHER)
Admission: EM | Admit: 2021-10-04 | Discharge: 2021-10-04 | Disposition: A | Discharge status: Home / Self Care | Payer: Medicare Other | Attending: Emergency Medicine | Admitting: Emergency Medicine

## 2021-10-04 ENCOUNTER — Emergency Department (HOSPITAL_BASED_OUTPATIENT_CLINIC_OR_DEPARTMENT_OTHER): Payer: Medicare Other

## 2021-10-04 DIAGNOSIS — S62611A Displaced fracture of proximal phalanx of left index finger, initial encounter for closed fracture: Secondary | ICD-10-CM | POA: Diagnosis not present

## 2021-10-04 DIAGNOSIS — Z7982 Long term (current) use of aspirin: Secondary | ICD-10-CM | POA: Insufficient documentation

## 2021-10-04 DIAGNOSIS — S62617A Displaced fracture of proximal phalanx of left little finger, initial encounter for closed fracture: Secondary | ICD-10-CM | POA: Insufficient documentation

## 2021-10-04 DIAGNOSIS — M7989 Other specified soft tissue disorders: Secondary | ICD-10-CM | POA: Diagnosis not present

## 2021-10-04 DIAGNOSIS — W19XXXA Unspecified fall, initial encounter: Secondary | ICD-10-CM | POA: Insufficient documentation

## 2021-10-04 DIAGNOSIS — Y92009 Unspecified place in unspecified non-institutional (private) residence as the place of occurrence of the external cause: Secondary | ICD-10-CM | POA: Diagnosis not present

## 2021-10-04 DIAGNOSIS — S62613A Displaced fracture of proximal phalanx of left middle finger, initial encounter for closed fracture: Secondary | ICD-10-CM | POA: Diagnosis not present

## 2021-10-04 DIAGNOSIS — S60941A Unspecified superficial injury of left index finger, initial encounter: Secondary | ICD-10-CM | POA: Diagnosis present

## 2021-10-04 DIAGNOSIS — M79642 Pain in left hand: Secondary | ICD-10-CM | POA: Diagnosis not present

## 2021-10-04 DIAGNOSIS — S62609A Fracture of unspecified phalanx of unspecified finger, initial encounter for closed fracture: Secondary | ICD-10-CM

## 2021-10-04 DIAGNOSIS — S62615A Displaced fracture of proximal phalanx of left ring finger, initial encounter for closed fracture: Secondary | ICD-10-CM | POA: Diagnosis not present

## 2021-10-04 MED ORDER — OXYCODONE HCL 5 MG PO TABS: 2.5000 mg | ORAL_TABLET | Freq: Once | ORAL | Status: DC

## 2021-10-04 MED ORDER — OXYCODONE HCL 5 MG/5ML PO SOLN: 2.5000 mg | Freq: Once | ORAL | Status: DC

## 2021-10-04 MED ORDER — KETOROLAC TROMETHAMINE 15 MG/ML IJ SOLN
15.0000 mg | Freq: Once | INTRAMUSCULAR | Status: AC
  Administered 2021-10-04: 15 mg via INTRAMUSCULAR
  Filled 2021-10-04: qty 1

## 2021-10-04 MED ORDER — OXYCODONE HCL 5 MG PO TABS: 2.5000 mg | ORAL_TABLET | ORAL | 0 refills | Status: DC | PRN

## 2021-10-04 NOTE — Discharge Instructions (Addendum)
take tylenol 1000mg (2 extra strength) four times a day.   Do not take your tramadol if you are going to take the medication that I prescribed.  Then take the pain medicine if you feel like you need it. Narcotics do not help with the pain, they only make you care about it less.  You can become addicted to this, people may break into your house to steal it.  It will constipate you.  If you drive under the influence of this medicine you can get a DUI.

## 2021-10-04 NOTE — ED Triage Notes (Signed)
Pt fell at home.  Left hand injury.  No head injury.  No thinners.

## 2021-10-04 NOTE — ED Provider Notes (Signed)
[facility] EMERGENCY DEPARTMENT Provider Note   CSN: [id number] Arrival date & time: 10/04/21  4656     History  Chief Complaint  Patient presents with   [name] [name] is a 71 y.o. female.  71 yo F with a chief complaint of left hand pain.  Yesterday she was trying to walk around her bed and she lost her footing and landed onto her left hand.  This happened yesterday afternoon.  She had some pain and swelling to the area and then went to sleep.  She woke up this morning with continued pain feels like the swelling has improved a little bit.  Took 2 Tylenols at home with some improvement.  She denies other injury denies head injury loss consciousness neck pain back pain chest pain abdominal pain other extremity pain.  Denies any pain to the elbow       Home Medications Prior to Admission medications   Medication Sig Start Date End Date Taking? Authorizing Provider  acetaminophen (TYLENOL) 325 MG tablet Take 1-2 tablets (325-650 mg total) by mouth every 4 (four) hours as needed for mild pain. Patient not taking: Reported on 09/17/2021 09/03/21   Love, Ivan Anchors, PA-C  aspirin EC 81 MG tablet Take 81 mg daily by mouth.    [provider]  carvedilol (COREG) 6.25 MG tablet Take 1 tablet (6.25 mg total) by mouth 2 (two) times daily with a meal. 09/03/21   Love, Ivan Anchors, PA-C  Cholecalciferol (VITAMIN D3) 2000 UNITS capsule Take 4,000 Units by mouth daily.     [provider]  citalopram (CELEXA) 20 MG tablet Take 1 tablet (20 mg total) by mouth daily. 09/17/21   Colon Branch, MD  cyanocobalamin 1000 MCG tablet Take 2 tablets by mouth daily. 09/03/21   Love, Ivan Anchors, PA-C  folic acid (FOLVITE) 1 MG tablet Take 1 tablet (1 mg total) by mouth daily. 09/03/21   Love, Ivan Anchors, PA-C  hydrOXYzine (ATARAX) 10 MG tablet Take 1-2 tablets (10-20 mg total) by mouth at bedtime as needed. 09/17/21   Colon Branch, MD  Multiple Vitamin (MULTIVITAMIN WITH MINERALS)  TABS tablet Take 1 tablet by mouth daily. 09/04/21   Love, Ivan Anchors, PA-C  oxyCODONE (ROXICODONE) 5 MG immediate release tablet Take 0.5 tablets (2.5 mg total) by mouth every 4 (four) hours as needed for severe pain. 10/04/21   Deno Etienne, DO  raloxifene (EVISTA) 60 MG tablet Take 1 tablet (60 mg total) by mouth daily. 04/03/20   Hilty, Nadean Corwin, MD  REPATHA SURECLICK 812 MG/ML SOAJ INJECT 140 MG( 1 ML) UNDER THE SKIN EVERY 14 DAYS Patient taking differently: Inject 140 mg into the skin every 14 (fourteen) days. 02/04/21   Hilty, Nadean Corwin, MD  rosuvastatin (CRESTOR) 40 MG tablet Take 1 tablet (40 mg total) by mouth at bedtime. 07/09/21   Hilty, Nadean Corwin, MD  senna-docusate (SENOKOT-S) 8.6-50 MG tablet Take 1 tablet by mouth at bedtime as needed for mild constipation. Patient not taking: Reported on 09/17/2021 08/30/21   Terrilee Croak, MD  thiamine 100 MG tablet Take 1 tablet (100 mg total) by mouth daily. Patient not taking: Reported on 09/17/2021 09/04/21   Love, Ivan Anchors, PA-C      Allergies    Mosquito (diagnostic), Tramadol hcl, Codeine, and Penicillins    Review of Systems   Review of Systems  Physical Exam Updated Vital Signs BP (!) 156/91    Pulse 90  Temp 98.5 F (36.9 C) (Oral)    Resp 16    Ht 5\' 2"  (1.575 m)    Wt 69.4 kg    SpO2 97%    BMI 27.98 kg/m  Physical Exam Vitals and nursing note reviewed.  Constitutional:      General: She is not in acute distress.    Appearance: She is well-developed. She is not diaphoretic.  HENT:     Head: Normocephalic and atraumatic.  Eyes:     Pupils: Pupils are equal, round, and reactive to light.  Cardiovascular:     Rate and Rhythm: Normal rate and regular rhythm.     Heart sounds: No murmur heard.   No friction rub. No gallop.  Pulmonary:     Effort: Pulmonary effort is normal.     Breath sounds: No wheezing or rales.  Abdominal:     General: There is no distension.     Palpations: Abdomen is soft.     Tenderness: There is no  abdominal tenderness.  Musculoskeletal:        General: Swelling and tenderness present.     Cervical back: Normal range of motion and neck supple.     Comments: Pain and swelling to the hand diffusely hard to localize pain.  Pulse motor and sensation intact.  Some limited range of motion due to edema.  No pain at the elbow.  Able to supinate and pronate without issue.  Palpated from head to toe without other noted areas of bony tenderness.  Skin:    General: Skin is warm and dry.  Neurological:     Mental Status: She is alert and oriented to person, place, and time.  Psychiatric:        Behavior: Behavior normal.    ED Results / Procedures / Treatments   Labs (all labs ordered are listed, but only abnormal results are displayed) Labs Reviewed - No data to display  EKG None  Radiology DG Hand Complete Left  Result Date: 10/04/2021 CLINICAL DATA:  Left hand pain after fall EXAM: LEFT HAND - COMPLETE 3+ VIEW COMPARISON:  None. FINDINGS: Acute minimally displaced and dorsally angulated fractures involving the proximal metaphyses of the index finger, long finger, ring finger, and small finger proximal phalanxes. No additional fractures identified. No dislocation. Bones are diffusely demineralized. Diffuse soft tissue swelling of the hand. IMPRESSION: 1. Acute minimally displaced and dorsally angulated fractures involving the proximal metaphyses of the index finger, long finger, ring finger, and small finger proximal phalanxes. 2. Osteopenia. Electronically Signed   By: Davina Poke D.O.   On: 10/04/2021 10:24    Procedures Procedures   SPLINT APPLICATION Date/Time: 50:93 PM Authorized by: Cecilio Asper Consent: Verbal consent obtained. Risks and benefits: risks, benefits and alternatives were discussed Consent given by: patient Splint applied by: EMT Location details: L hand Splint type: sugar tong, extended over PIP Supplies used: ortho glass Post-procedure: The  splinted body part was neurovascularly unchanged following the procedure. Patient tolerance: Patient tolerated the procedure well with no immediate complications.     Medications Ordered in ED Medications  oxyCODONE (Oxy IR/ROXICODONE) immediate release tablet 2.5 mg (2.5 mg Oral Not Given 10/04/21 1019)  ketorolac (TORADOL) 15 MG/ML injection 15 mg (15 mg Intramuscular Given 10/04/21 1021)    ED Course/ Medical Decision Making/ A&P                           Medical Decision Making Amount  and/or Complexity of Data Reviewed Radiology: ordered.  Risk Prescription drug management.   Patient is a 71 y.o. female with a cc of left hand pain.  This occurred after a fall.  She fell onto her left hand and had developed some pain.  Nonsyncopal by history.  Will obtain a plain film.  I independently interpreted the patients labs and imaging patient has a fracture to the proximal aspect of each of her proximal phalanx of the left hand 2 through 5.  I did discuss this with Dr. Tempie Donning, hand surgery recommended dorsal splint he will see her in the office Monday or Tuesday.  Short course of narcotics.  PDMP reviewed she is on tramadol.  We will have her hold that if she would like to take oxycodone instead.  12:29 PM:  I have discussed the diagnosis/risks/treatment options with the patient and family.  Evaluation and diagnostic testing in the emergency department does not suggest an emergent condition requiring admission or immediate intervention beyond what has been performed at this time.  They will follow up with  Hand. We also discussed returning to the ED immediately if new or worsening sx occur. We discussed the sx which are most concerning (e.g., sudden worsening pain, fever, inability to tolerate by mouth) that necessitate immediate return. Medications administered to the patient during their visit and any new prescriptions provided to the patient are listed below.  Medications given during this  visit Medications  oxyCODONE (Oxy IR/ROXICODONE) immediate release tablet 2.5 mg (2.5 mg Oral Not Given 10/04/21 1019)  ketorolac (TORADOL) 15 MG/ML injection 15 mg (15 mg Intramuscular Given 10/04/21 1021)     The patient appears reasonably screen and/or stabilized for discharge and I doubt any other medical condition or other [facility] requiring further screening, evaluation, or treatment in the ED at this time prior to discharge.         Final Clinical Impression(s) / ED Diagnoses Final diagnoses:  Closed fracture of multiple phalanges of digit of hand, initial encounter    Rx / DC Orders ED Discharge Orders          Ordered    oxyCODONE (ROXICODONE) 5 MG immediate release tablet  Every 4 hours PRN,   Status:  Discontinued        10/04/21 1146    oxyCODONE (ROXICODONE) 5 MG immediate release tablet  Every 4 hours PRN        10/04/21 Frannie, [place], DO 10/04/21 1230

## 2021-10-08 ENCOUNTER — Encounter: Payer: Self-pay | Admitting: Orthopedic Surgery

## 2021-10-08 ENCOUNTER — Ambulatory Visit (INDEPENDENT_AMBULATORY_CARE_PROVIDER_SITE_OTHER): Payer: Medicare Other | Admitting: Orthopedic Surgery

## 2021-10-08 ENCOUNTER — Other Ambulatory Visit: Payer: Self-pay

## 2021-10-08 DIAGNOSIS — S62613A Displaced fracture of proximal phalanx of left middle finger, initial encounter for closed fracture: Secondary | ICD-10-CM

## 2021-10-08 DIAGNOSIS — S62615A Displaced fracture of proximal phalanx of left ring finger, initial encounter for closed fracture: Secondary | ICD-10-CM | POA: Diagnosis not present

## 2021-10-08 DIAGNOSIS — S62619D Displaced fracture of proximal phalanx of unspecified finger, subsequent encounter for fracture with routine healing: Secondary | ICD-10-CM | POA: Insufficient documentation

## 2021-10-08 DIAGNOSIS — S62611A Displaced fracture of proximal phalanx of left index finger, initial encounter for closed fracture: Secondary | ICD-10-CM | POA: Diagnosis not present

## 2021-10-08 DIAGNOSIS — S62617A Displaced fracture of proximal phalanx of left little finger, initial encounter for closed fracture: Secondary | ICD-10-CM | POA: Diagnosis not present

## 2021-10-08 DIAGNOSIS — S62619A Displaced fracture of proximal phalanx of unspecified finger, initial encounter for closed fracture: Secondary | ICD-10-CM | POA: Insufficient documentation

## 2021-10-08 NOTE — Progress Notes (Signed)
Office Visit Note   Patient: [name] [name]           Date of Birth: 1951/03/25           MRN: [id number] Visit Date: 10/08/2021              Requested by: Colon Branch, [address] [place],  [place] [address] PCP: Colon Branch, MD   Assessment & Plan: Visit Diagnoses:  1. Multiple closed fractures of proximal phalanx of finger with routine healing     Plan: Discussed with patient that she has fractures of the proximal phalanx of the index through small finger.  She has some severe clawing of the middle and ring fingers.  We discussed surgical versus nonsurgical treatment options.  After discussion, the patient and her sister want to proceed with surgical fracture fixation.  Discussed this was most likely be accomplished with either pins or screws.  We discussed the risk of surgery including bleeding, infection, damage to blood vessels nerves, malunion, nonunion, continued symptoms, stiffness, need for additional surgeries.  We also discussed the expected postoperative rehab process.  She is significantly swollen today so we will plan to this next week to allow some the swelling to subside.  Follow-Up Instructions: No follow-ups on file.   Orders:  No orders of the defined types were placed in this encounter.  No orders of the defined types were placed in this encounter.     Procedures: No procedures performed   Clinical Data: No additional findings.   Subjective: Chief Complaint  Patient presents with   Left Hand - New Patient (Initial Visit)    This is a 71 year old right-hand-dominant female who presents for ER follow-up of multiple left hand fractures.  She tripped over the edge of her bed and landed on her outstretched left hand.  She is seen in the ER where she was found to have fractures of the index through small finger proximal phalanx.  She was placed in a splint in the ER.  She notes that her pain is about the same as it was at that time.   She describes soreness throughout her hand.  Her pain is worse with any attempted range of motion of her fingers.  She has no pain in the thumb.  She is no pain in her wrist, forearm, or elbow.  She has been working on elevating her hand on pillows at night.  She is taking Tylenol for pain.   Review of Systems   Objective: Vital Signs: BP (!) 144/95 (BP Location: Right Arm, Patient Position: Sitting, Cuff Size: Normal)    Pulse (!) 107    Ht 5\' 2"  (1.575 m)    Wt 152 lb 6.4 oz (69.1 kg)    SpO2 97%    BMI 27.87 kg/m   Physical Exam Constitutional:      Appearance: Normal appearance.  Cardiovascular:     Rate and Rhythm: Normal rate.     Pulses: Normal pulses.  Pulmonary:     Effort: Pulmonary effort is normal.  Skin:    General: Skin is warm and dry.     Capillary Refill: Capillary refill takes less than 2 seconds.  Neurological:     Mental Status: She is alert.    Right Hand Exam   Tenderness  Right hand tenderness location: TTP across dorsum of hand, worse towards MP joints.  Significant diffuse ecchymosis involving her entire hand w/ moderate associated swelling.  Other  Erythema: absent Sensation: normal Pulse: present  Comments:  Mild pseudoclawing of middle and ring fingers.  No obvious malrotation.  Finger ROM severely limited by pain.      Specialty Comments:  No specialty comments available.  Imaging: No results found.   PMFS History: Patient Active Problem List   Diagnosis Date Noted   Multiple closed fractures of proximal phalanx of finger with routine healing 10/08/2021   EtOH dependence ([place]) 09/11/2021   Vitamin B 12 deficiency 09/11/2021   Post-traumatic headache 09/11/2021   Insomnia 09/11/2021   Post concussion syndrome 08/30/2021   Benign essential HTN    Hyperglycemia    Urinary incontinence    Pelvic pain    Closed pelvic fracture ([place]) 08/03/2020   Closed fracture of left superior pubic ramus ([place]) 07/30/2020   Left hip pain  07/30/2020   GAD (generalized anxiety disorder) 07/30/2020   DJD (degenerative joint disease) 09/20/2019   Familial hyperlipidemia 07/01/2018   Right shoulder pain 07/14/2017   Malignant melanoma of skin of canthus of right eye ([place]) 03/16/2017   PCP NOTES >>>>>>>>>>>>>>> 05/22/2015   High coronary artery calcium score 02/14/2015   Hernia, abdominal 04/01/2013   Depression with anxiety 04/01/2012   Malignant neoplasm of upper lobe, bronchus or lung 09/30/2011   H/O: lung cancer 09/25/2011   Annual physical exam 09/05/2011   IBS -diarrhea predominant 09/05/2011   COPD (chronic obstructive pulmonary disease) ([place]) 09/05/2011   Osteoporosis 03/01/2010   TOBACCO ABUSE 11/14/2008   Dyslipidemia 05/18/2007   Essential hypertension 05/18/2007   Past Medical History:  Diagnosis Date   Anemia    duering cancer treatment    Anxiety    Arthritis    OA hands    Bilateral lower extremity pain    Blood transfusion without reported diagnosis    during chemo for lung cancer   Cataract    forming  but very small    Cholelithiasis    Colon polyps    adenomatous   High cholesterol    History of chemotherapy    for lung cancer- completed 2013   Hx of radiation therapy 10/07/11 to 11/20/11   right lung   Hx of radiation therapy 01/07/2012- 01/21/12   cranial irradiation   Hypertension    Lung cancer ([place]) 09/19/11   Stage III currently in remission   Malignant melanoma of skin of canthus of right eye ([place]) 02/2017   Osteoporosis 04/2014   Dexa scan by Dr. Corinna Capra, T-score -2.7, next in 2 years, with at least 13 % major fracture in 10 years   Pitting edema    Bilateral lower extremities, duplex ultrasound 04/03/2015 normal, no DVT    Family History  Problem Relation Age of Onset   Bladder Cancer Father    Stroke Father    Colon polyps Father    Atrial fibrillation Mother    Heart disease Mother        CHF   Clotting disorder Mother        warfarin   Colon polyps Mother    Colon cancer  Maternal Aunt 70   Drug abuse Son    Breast cancer Neg Hx    Esophageal cancer Neg Hx    Stomach cancer Neg Hx    Rectal cancer Neg Hx     Past Surgical History:  Procedure Laterality Date   CHOLECYSTECTOMY  04/2011   biliary stent placement   COLONOSCOPY     INCISIONAL HERNIA REPAIR  2015  MOHS SURGERY Right 02/2017   cheek and eyelid   Josph Macho Touch     Vaginal procedure   POLYPECTOMY     TUBAL LIGATION     Social History   Occupational History   Occupation: retired, Scientist, research (medical)  Tobacco Use   Smoking status: Former    Types: Cigarettes    Quit date: 09/08/2011    Years since quitting: 10.0   Smokeless tobacco: Never  Substance and Sexual Activity   Alcohol use: Yes    Comment: occasional   Drug use: No   Sexual activity: Not on file

## 2021-10-08 NOTE — H&P (View-Only) (Signed)
Office Visit Note   Patient: [name] [name]           Date of Birth: 1950/09/22           MRN: [id number] Visit Date: 10/08/2021              Requested by: Colon Branch, [address] [place],  [place] [address] PCP: Colon Branch, MD   Assessment & Plan: Visit Diagnoses:  1. Multiple closed fractures of proximal phalanx of finger with routine healing     Plan: Discussed with patient that she has fractures of the proximal phalanx of the index through small finger.  She has some severe clawing of the middle and ring fingers.  We discussed surgical versus nonsurgical treatment options.  After discussion, the patient and her sister want to proceed with surgical fracture fixation.  Discussed this was most likely be accomplished with either pins or screws.  We discussed the risk of surgery including bleeding, infection, damage to blood vessels nerves, malunion, nonunion, continued symptoms, stiffness, need for additional surgeries.  We also discussed the expected postoperative rehab process.  She is significantly swollen today so we will plan to this next week to allow some the swelling to subside.  Follow-Up Instructions: No follow-ups on file.   Orders:  No orders of the defined types were placed in this encounter.  No orders of the defined types were placed in this encounter.     Procedures: No procedures performed   Clinical Data: No additional findings.   Subjective: Chief Complaint  Patient presents with   Left Hand - New Patient (Initial Visit)    This is a 71 year old right-hand-dominant female who presents for ER follow-up of multiple left hand fractures.  She tripped over the edge of her bed and landed on her outstretched left hand.  She is seen in the ER where she was found to have fractures of the index through small finger proximal phalanx.  She was placed in a splint in the ER.  She notes that her pain is about the same as it was at that time.   She describes soreness throughout her hand.  Her pain is worse with any attempted range of motion of her fingers.  She has no pain in the thumb.  She is no pain in her wrist, forearm, or elbow.  She has been working on elevating her hand on pillows at night.  She is taking Tylenol for pain.   Review of Systems   Objective: Vital Signs: BP (!) 144/95 (BP Location: Right Arm, Patient Position: Sitting, Cuff Size: Normal)    Pulse (!) 107    Ht 5\' 2"  (1.575 m)    Wt 152 lb 6.4 oz (69.1 kg)    SpO2 97%    BMI 27.87 kg/m   Physical Exam Constitutional:      Appearance: Normal appearance.  Cardiovascular:     Rate and Rhythm: Normal rate.     Pulses: Normal pulses.  Pulmonary:     Effort: Pulmonary effort is normal.  Skin:    General: Skin is warm and dry.     Capillary Refill: Capillary refill takes less than 2 seconds.  Neurological:     Mental Status: She is alert.    Right Hand Exam   Tenderness  Right hand tenderness location: TTP across dorsum of hand, worse towards MP joints.  Significant diffuse ecchymosis involving her entire hand w/ moderate associated swelling.  Other  Erythema: absent Sensation: normal Pulse: present  Comments:  Mild pseudoclawing of middle and ring fingers.  No obvious malrotation.  Finger ROM severely limited by pain.      Specialty Comments:  No specialty comments available.  Imaging: No results found.   PMFS History: Patient Active Problem List   Diagnosis Date Noted   Multiple closed fractures of proximal phalanx of finger with routine healing 10/08/2021   EtOH dependence ([place]) 09/11/2021   Vitamin B 12 deficiency 09/11/2021   Post-traumatic headache 09/11/2021   Insomnia 09/11/2021   Post concussion syndrome 08/30/2021   Benign essential HTN    Hyperglycemia    Urinary incontinence    Pelvic pain    Closed pelvic fracture ([place]) 08/03/2020   Closed fracture of left superior pubic ramus ([place]) 07/30/2020   Left hip pain  07/30/2020   GAD (generalized anxiety disorder) 07/30/2020   DJD (degenerative joint disease) 09/20/2019   Familial hyperlipidemia 07/01/2018   Right shoulder pain 07/14/2017   Malignant melanoma of skin of canthus of right eye ([place]) 03/16/2017   PCP NOTES >>>>>>>>>>>>>>> 05/22/2015   High coronary artery calcium score 02/14/2015   Hernia, abdominal 04/01/2013   Depression with anxiety 04/01/2012   Malignant neoplasm of upper lobe, bronchus or lung 09/30/2011   H/O: lung cancer 09/25/2011   Annual physical exam 09/05/2011   IBS -diarrhea predominant 09/05/2011   COPD (chronic obstructive pulmonary disease) ([place]) 09/05/2011   Osteoporosis 03/01/2010   TOBACCO ABUSE 11/14/2008   Dyslipidemia 05/18/2007   Essential hypertension 05/18/2007   Past Medical History:  Diagnosis Date   Anemia    duering cancer treatment    Anxiety    Arthritis    OA hands    Bilateral lower extremity pain    Blood transfusion without reported diagnosis    during chemo for lung cancer   Cataract    forming  but very small    Cholelithiasis    Colon polyps    adenomatous   High cholesterol    History of chemotherapy    for lung cancer- completed 2013   Hx of radiation therapy 10/07/11 to 11/20/11   right lung   Hx of radiation therapy 01/07/2012- 01/21/12   cranial irradiation   Hypertension    Lung cancer ([place]) 09/19/11   Stage III currently in remission   Malignant melanoma of skin of canthus of right eye ([place]) 02/2017   Osteoporosis 04/2014   Dexa scan by Dr. Corinna Capra, T-score -2.7, next in 2 years, with at least 13 % major fracture in 10 years   Pitting edema    Bilateral lower extremities, duplex ultrasound 04/03/2015 normal, no DVT    Family History  Problem Relation Age of Onset   Bladder Cancer Father    Stroke Father    Colon polyps Father    Atrial fibrillation Mother    Heart disease Mother        CHF   Clotting disorder Mother        warfarin   Colon polyps Mother    Colon cancer  Maternal Aunt 70   Drug abuse Son    Breast cancer Neg Hx    Esophageal cancer Neg Hx    Stomach cancer Neg Hx    Rectal cancer Neg Hx     Past Surgical History:  Procedure Laterality Date   CHOLECYSTECTOMY  04/2011   biliary stent placement   COLONOSCOPY     INCISIONAL HERNIA REPAIR  2015  MOHS SURGERY Right 02/2017   cheek and eyelid   Josph Macho Touch     Vaginal procedure   POLYPECTOMY     TUBAL LIGATION     Social History   Occupational History   Occupation: retired, Scientist, research (medical)  Tobacco Use   Smoking status: Former    Types: Cigarettes    Quit date: 09/08/2011    Years since quitting: 10.0   Smokeless tobacco: Never  Substance and Sexual Activity   Alcohol use: Yes    Comment: occasional   Drug use: No   Sexual activity: Not on file

## 2021-10-10 DIAGNOSIS — H2513 Age-related nuclear cataract, bilateral: Secondary | ICD-10-CM | POA: Diagnosis not present

## 2021-10-10 DIAGNOSIS — H43812 Vitreous degeneration, left eye: Secondary | ICD-10-CM | POA: Diagnosis not present

## 2021-10-10 DIAGNOSIS — H10413 Chronic giant papillary conjunctivitis, bilateral: Secondary | ICD-10-CM | POA: Diagnosis not present

## 2021-10-14 ENCOUNTER — Telehealth: Payer: Self-pay | Admitting: Orthopedic Surgery

## 2021-10-14 NOTE — Telephone Encounter (Signed)
Pt's sister has questions about her surgery being out to October 23, 2021. Please call at [phone].

## 2021-10-15 ENCOUNTER — Encounter (HOSPITAL_BASED_OUTPATIENT_CLINIC_OR_DEPARTMENT_OTHER): Payer: Self-pay | Admitting: Orthopedic Surgery

## 2021-10-15 ENCOUNTER — Other Ambulatory Visit: Payer: Self-pay | Admitting: Orthopedic Surgery

## 2021-10-15 ENCOUNTER — Other Ambulatory Visit: Payer: Self-pay

## 2021-10-15 MED ORDER — TRAMADOL HCL 50 MG PO TABS: 50.0000 mg | ORAL_TABLET | Freq: Four times a day (QID) | ORAL | 0 refills | Status: AC | PRN

## 2021-10-15 NOTE — Telephone Encounter (Signed)
Contacted patient sister and answered all questions that she had. States that she will contact our office back if she continues to think of more prior to surgery. Patient's sister is requesting medication for pain, states that patient has only been taking OTC tylenol which has not been helping.

## 2021-10-18 ENCOUNTER — Telehealth: Payer: Self-pay | Admitting: Internal Medicine

## 2021-10-18 MED ORDER — CARVEDILOL 6.25 MG PO TABS: 6.2500 mg | ORAL_TABLET | Freq: Two times a day (BID) | ORAL | 1 refills | Status: DC

## 2021-10-18 NOTE — Telephone Encounter (Signed)
According to the  medication list she is supposed to be on carvedilol 6.25 mg 1 tablet twice daily.  Okay to refill.  Advised patient to monitor her her BPs to be sure they are okay.

## 2021-10-18 NOTE — Telephone Encounter (Signed)
Patient states she need Coreg to be prescribed to her for her heart. She stated that they took her off her Lisinopril and since she is off it, she needs the coreg instead. Advised patient to schedule an OV or VV, but patient declined and stated Dr. Larose Kells knew all about it. Please advise.

## 2021-10-18 NOTE — Telephone Encounter (Signed)
Misread first message, my apologizes, Rx sent.

## 2021-10-23 ENCOUNTER — Encounter (HOSPITAL_BASED_OUTPATIENT_CLINIC_OR_DEPARTMENT_OTHER): Payer: Self-pay | Admitting: Orthopedic Surgery

## 2021-10-23 ENCOUNTER — Ambulatory Visit (HOSPITAL_BASED_OUTPATIENT_CLINIC_OR_DEPARTMENT_OTHER): Payer: Medicare Other

## 2021-10-23 ENCOUNTER — Ambulatory Visit (HOSPITAL_BASED_OUTPATIENT_CLINIC_OR_DEPARTMENT_OTHER): Payer: Medicare Other | Admitting: Anesthesiology

## 2021-10-23 ENCOUNTER — Encounter (HOSPITAL_BASED_OUTPATIENT_CLINIC_OR_DEPARTMENT_OTHER)
Admission: RE | Disposition: A | Discharge status: Home / Self Care | Payer: Self-pay | Source: Home / Self Care | Attending: Orthopedic Surgery

## 2021-10-23 ENCOUNTER — Other Ambulatory Visit: Payer: Self-pay | Admitting: Orthopedic Surgery

## 2021-10-23 ENCOUNTER — Other Ambulatory Visit: Payer: Self-pay

## 2021-10-23 ENCOUNTER — Ambulatory Visit (HOSPITAL_BASED_OUTPATIENT_CLINIC_OR_DEPARTMENT_OTHER)
Admission: RE | Admit: 2021-10-23 | Discharge: 2021-10-23 | Disposition: A | Discharge status: Home / Self Care | Payer: Medicare Other | Attending: Orthopedic Surgery | Admitting: Orthopedic Surgery

## 2021-10-23 DIAGNOSIS — Z85820 Personal history of malignant melanoma of skin: Secondary | ICD-10-CM | POA: Diagnosis not present

## 2021-10-23 DIAGNOSIS — S62611A Displaced fracture of proximal phalanx of left index finger, initial encounter for closed fracture: Secondary | ICD-10-CM

## 2021-10-23 DIAGNOSIS — S62617A Displaced fracture of proximal phalanx of left little finger, initial encounter for closed fracture: Secondary | ICD-10-CM | POA: Diagnosis not present

## 2021-10-23 DIAGNOSIS — S62615A Displaced fracture of proximal phalanx of left ring finger, initial encounter for closed fracture: Secondary | ICD-10-CM | POA: Insufficient documentation

## 2021-10-23 DIAGNOSIS — X58XXXA Exposure to other specified factors, initial encounter: Secondary | ICD-10-CM | POA: Insufficient documentation

## 2021-10-23 DIAGNOSIS — Z87891 Personal history of nicotine dependence: Secondary | ICD-10-CM | POA: Diagnosis not present

## 2021-10-23 DIAGNOSIS — S62613A Displaced fracture of proximal phalanx of left middle finger, initial encounter for closed fracture: Secondary | ICD-10-CM

## 2021-10-23 DIAGNOSIS — J449 Chronic obstructive pulmonary disease, unspecified: Secondary | ICD-10-CM | POA: Diagnosis not present

## 2021-10-23 DIAGNOSIS — Z85118 Personal history of other malignant neoplasm of bronchus and lung: Secondary | ICD-10-CM | POA: Diagnosis not present

## 2021-10-23 DIAGNOSIS — I10 Essential (primary) hypertension: Secondary | ICD-10-CM | POA: Insufficient documentation

## 2021-10-23 DIAGNOSIS — S62619A Displaced fracture of proximal phalanx of unspecified finger, initial encounter for closed fracture: Secondary | ICD-10-CM

## 2021-10-23 DIAGNOSIS — W010XXA Fall on same level from slipping, tripping and stumbling without subsequent striking against object, initial encounter: Secondary | ICD-10-CM | POA: Diagnosis not present

## 2021-10-23 DIAGNOSIS — Z923 Personal history of irradiation: Secondary | ICD-10-CM | POA: Insufficient documentation

## 2021-10-23 HISTORY — PX: CLOSED REDUCTION FINGER WITH PERCUTANEOUS PINNING: SHX5612

## 2021-10-23 HISTORY — DX: Alcohol dependence, uncomplicated: F10.20

## 2021-10-23 SURGERY — CLOSED REDUCTION, FINGER, WITH PERCUTANEOUS PINNING
Anesthesia: Monitor Anesthesia Care | Site: Finger | Laterality: Left

## 2021-10-23 MED ORDER — FENTANYL CITRATE (PF) 100 MCG/2ML IJ SOLN: 25.0000 ug | INTRAMUSCULAR | Status: DC | PRN

## 2021-10-23 MED ORDER — BUPIVACAINE HCL (PF) 0.5 % IJ SOLN
INTRAMUSCULAR | Status: DC | PRN
Start: 2021-10-23 — End: 2021-10-23
  Administered 2021-10-23: 15 mL via PERINEURAL

## 2021-10-23 MED ORDER — PROPOFOL 500 MG/50ML IV EMUL
INTRAVENOUS | Status: DC | PRN
  Administered 2021-10-23: 100 ug/kg/min via INTRAVENOUS

## 2021-10-23 MED ORDER — FENTANYL CITRATE (PF) 100 MCG/2ML IJ SOLN
INTRAMUSCULAR | Status: AC
  Filled 2021-10-23: qty 2

## 2021-10-23 MED ORDER — ONDANSETRON HCL 4 MG/2ML IJ SOLN
INTRAMUSCULAR | Status: DC | PRN
  Administered 2021-10-23: 4 mg via INTRAVENOUS

## 2021-10-23 MED ORDER — BUPIVACAINE LIPOSOME 1.3 % IJ SUSP
INTRAMUSCULAR | Status: DC | PRN
  Administered 2021-10-23: 10 mL via PERINEURAL

## 2021-10-23 MED ORDER — OXYCODONE HCL 5 MG PO TABS: 5.0000 mg | ORAL_TABLET | ORAL | 0 refills | Status: AC | PRN

## 2021-10-23 MED ORDER — MIDAZOLAM HCL 2 MG/2ML IJ SOLN
1.0000 mg | Freq: Once | INTRAMUSCULAR | Status: AC
Start: 2021-10-23 — End: 2021-10-23
  Administered 2021-10-23: 1 mg via INTRAVENOUS

## 2021-10-23 MED ORDER — 0.9 % SODIUM CHLORIDE (POUR BTL) OPTIME
TOPICAL | Status: DC | PRN
  Administered 2021-10-23: 100 mL

## 2021-10-23 MED ORDER — MIDAZOLAM HCL 5 MG/5ML IJ SOLN
INTRAMUSCULAR | Status: DC | PRN
  Administered 2021-10-23: 2 mg via INTRAVENOUS

## 2021-10-23 MED ORDER — ONDANSETRON HCL 4 MG/2ML IJ SOLN: 4.0000 mg | Freq: Once | INTRAMUSCULAR | Status: DC | PRN

## 2021-10-23 MED ORDER — FENTANYL CITRATE (PF) 100 MCG/2ML IJ SOLN
50.0000 ug | Freq: Once | INTRAMUSCULAR | Status: AC
  Administered 2021-10-23: 50 ug via INTRAVENOUS

## 2021-10-23 MED ORDER — MIDAZOLAM HCL 2 MG/2ML IJ SOLN
INTRAMUSCULAR | Status: AC
  Filled 2021-10-23: qty 2

## 2021-10-23 MED ORDER — LACTATED RINGERS IV SOLN: INTRAVENOUS | Status: DC

## 2021-10-23 MED ORDER — PHENYLEPHRINE HCL (PRESSORS) 10 MG/ML IV SOLN
INTRAVENOUS | Status: DC | PRN
  Administered 2021-10-23: 80 ug via INTRAVENOUS

## 2021-10-23 MED ORDER — CEFAZOLIN SODIUM-DEXTROSE 2-4 GM/100ML-% IV SOLN
2.0000 g | INTRAVENOUS | Status: AC
  Administered 2021-10-23: 2 g via INTRAVENOUS

## 2021-10-23 MED ORDER — CEFAZOLIN SODIUM-DEXTROSE 2-4 GM/100ML-% IV SOLN
INTRAVENOUS | Status: AC
  Filled 2021-10-23: qty 100

## 2021-10-23 SURGICAL SUPPLY — 51 items
APL PRP STRL LF DISP 70% ISPRP (MISCELLANEOUS) ×1
BLADE SURG 15 STRL LF DISP TIS (BLADE) ×1 IMPLANT
BLADE SURG 15 STRL SS (BLADE) ×2
BNDG CMPR 9X4 STRL LF SNTH (GAUZE/BANDAGES/DRESSINGS) ×1
BNDG COHESIVE 1X5 TAN STRL LF (GAUZE/BANDAGES/DRESSINGS) IMPLANT
BNDG COHESIVE 4X5 TAN ST LF (GAUZE/BANDAGES/DRESSINGS) ×1 IMPLANT
BNDG ELASTIC 3X5.8 VLCR STR LF (GAUZE/BANDAGES/DRESSINGS) ×2 IMPLANT
BNDG ESMARK 4X9 LF (GAUZE/BANDAGES/DRESSINGS) ×1 IMPLANT
BNDG GAUZE ELAST 4 BULKY (GAUZE/BANDAGES/DRESSINGS) ×2 IMPLANT
CHLORAPREP W/TINT 26 (MISCELLANEOUS) ×2 IMPLANT
CORD BIPOLAR FORCEPS 12FT (ELECTRODE) IMPLANT
COVER BACK TABLE 60X90IN (DRAPES) ×2 IMPLANT
COVER MAYO STAND STRL (DRAPES) ×2 IMPLANT
DRAPE EXTREMITY T 121X128X90 (DISPOSABLE) ×2 IMPLANT
DRAPE OEC MINIVIEW 54X84 (DRAPES) ×2 IMPLANT
DRAPE SURG 17X23 STRL (DRAPES) ×2 IMPLANT
GAUZE SPONGE 4X4 12PLY STRL (GAUZE/BANDAGES/DRESSINGS) ×2 IMPLANT
GAUZE XEROFORM 1X8 LF (GAUZE/BANDAGES/DRESSINGS) ×1 IMPLANT
GLOVE SURG ENC MOIS LTX SZ7 (GLOVE) ×2 IMPLANT
GOWN STRL REUS W/ TWL LRG LVL3 (GOWN DISPOSABLE) ×1 IMPLANT
GOWN STRL REUS W/ TWL XL LVL3 (GOWN DISPOSABLE) ×1 IMPLANT
GOWN STRL REUS W/TWL LRG LVL3 (GOWN DISPOSABLE) ×2
GOWN STRL REUS W/TWL XL LVL3 (GOWN DISPOSABLE) ×2
K-WIRE .035X4 (WIRE) ×1 IMPLANT
KIT FRAME INST EXFX 2.0 (INSTRUMENTS) ×1 IMPLANT
NS IRRIG 1000ML POUR BTL (IV SOLUTION) ×2 IMPLANT
PACK BASIN DAY SURGERY FS (CUSTOM PROCEDURE TRAY) ×2 IMPLANT
PAD CAST 3X4 CTTN HI CHSV (CAST SUPPLIES) IMPLANT
PADDING CAST COTTON 3X4 STRL (CAST SUPPLIES)
SCREW BONE INFRAME 2.0X12 (Screw) ×1 IMPLANT
SCREW BONE INFRAME 2.0X14 (Screw) ×1 IMPLANT
SCREW BONE INFRAME 2.0X18 (Screw) ×1 IMPLANT
SCREW BONE INFRAME 2.0X22 (Screw) ×1 IMPLANT
SCREW BONE INFRAME 2.0X24 (Screw) ×2 IMPLANT
SCREW BONE INFRAME 2.0X26 (Screw) ×1 IMPLANT
SCREW BONE INFRAME 2.0X30 (Screw) ×1 IMPLANT
SCREW BONE INFRAME 2.0X32 (Screw) ×1 IMPLANT
SCREW BONE INFRAME 2.0X36 (Screw) ×1 IMPLANT
SLEEVE SCD COMPRESS KNEE MED (STOCKING) ×1 IMPLANT
SLING ARM FOAM STRAP MED (SOFTGOODS) ×1 IMPLANT
SPLINT PLASTER CAST XFAST 4X15 (CAST SUPPLIES) ×10 IMPLANT
SPLINT PLASTER XTRA FAST SET 4 (CAST SUPPLIES) ×10
SUCTION FRAZIER HANDLE 12FR (TUBING) ×2
SUCTION TUBE FRAZIER 12FR DISP (TUBING) ×1 IMPLANT
SUT ETHILON 4 0 PS 2 18 (SUTURE) ×2 IMPLANT
SUT MNCRL AB 3-0 PS2 18 (SUTURE) ×2 IMPLANT
SUT VICRYL 4-0 PS2 18IN ABS (SUTURE) IMPLANT
SYR BULB EAR ULCER 3OZ GRN STR (SYRINGE) ×1 IMPLANT
SYR CONTROL 10ML LL (SYRINGE) IMPLANT
TOWEL GREEN STERILE FF (TOWEL DISPOSABLE) ×4 IMPLANT
UNDERPAD 30X36 HEAVY ABSORB (UNDERPADS AND DIAPERS) ×2 IMPLANT

## 2021-10-23 NOTE — Transfer of Care (Signed)
Immediate Anesthesia Transfer of Care Note ? ?Patient: [name] [name] ? ?Procedure(s) Performed: LEFT INDEX, MIDDLE, RING, AND SMALL FINGER CLOSED REDUCTION FINGER WITH PERCUTANEOUS PINNING (Left: Finger) ? ?Patient Location: PACU ? ?Anesthesia Type:MAC and Regional ? ?Level of Consciousness: drowsy ? ?Airway & Oxygen Therapy: Patient Spontanous Breathing and Patient connected to face mask oxygen ? ?Post-op Assessment: Report given to RN and Post -op Vital signs reviewed and stable ? ?Post vital signs: Reviewed and stable ? ?Last Vitals:  ?Vitals Value Taken Time  ?BP 153/98 10/23/21 1445  ?Temp    ?Pulse 91 10/23/21 1446  ?Resp 18 10/23/21 1446  ?SpO2 95 % 10/23/21 1446  ?Vitals shown include unvalidated device data. ? ?Last Pain:  ?Vitals:  ? 10/23/21 1014  ?TempSrc: Oral  ?PainSc: 8   ?   ? ?Patients Stated Pain Goal: 3 (10/23/21 1014) ? ?Complications: No notable events documented. ?

## 2021-10-23 NOTE — Anesthesia Postprocedure Evaluation (Signed)
Anesthesia Post Note ? ?Patient: [name] [name] ? ?Procedure(s) Performed: LEFT INDEX, MIDDLE, RING, AND SMALL FINGER CLOSED REDUCTION FINGER WITH PERCUTANEOUS PINNING (Left: Finger) ? ?  ? ?Patient location during evaluation: PACU ?Anesthesia Type: MAC ?Level of consciousness: awake and alert and oriented ?Pain management: pain level controlled ?Vital Signs Assessment: post-procedure vital signs reviewed and stable ?Respiratory status: spontaneous breathing, nonlabored ventilation and respiratory function stable ?Cardiovascular status: stable and blood pressure returned to baseline ?Postop Assessment: no apparent nausea or vomiting ?Anesthetic complications: no ? ? ?No notable events documented. ? ?Last Vitals:  ?Vitals:  ? 10/23/21 1500 10/23/21 1515  ?BP: (!) 159/90 133/85  ?Pulse: 95 94  ?Resp: (!) 21 16  ?Temp:    ?SpO2: 100% 96%  ?  ?Last Pain:  ?Vitals:  ? 10/23/21 1515  ?TempSrc:   ?PainSc: 0-No pain  ? ? ?  ?  ?  ?  ?  ?  ? ?Mclain Freer A. ? ? ? ? ?

## 2021-10-23 NOTE — Progress Notes (Signed)
Assisted Dr. Royce Macadamia with left, ultrasound guided, supraclavicular block. Side rails up, monitors on throughout procedure. See vital signs in flow sheet. Tolerated Procedure well. ?

## 2021-10-23 NOTE — Interval H&P Note (Signed)
History and Physical Interval Note: ? ?10/23/2021 ?10:48 AM ? ?[name] [name]  has presented today for surgery, with the diagnosis of Left index, middle, ring, and small finger proxomal phalanx fractures.  The various methods of treatment have been discussed with the patient and family. After consideration of risks, benefits and other options for treatment, the patient has consented to  Procedure(s): ?LEFT INDEX, MIDDLE, RING, AND SMALL FINGER CLOSED REDUCTION FINGER WITH PERCUTANEOUS PINNING (Left) as a surgical intervention.  The patient's history has been reviewed, patient examined, no change in status, stable for surgery.  I have reviewed the patient's chart and labs.  Questions were answered to the patient's satisfaction.   ? ? ? [name] ? ? ?

## 2021-10-23 NOTE — Brief Op Note (Signed)
10/23/2021 ? ?2:45 PM ? ?PATIENT:  [name] [name]  71 y.o. female ? ?PRE-OPERATIVE DIAGNOSIS:  Left index, middle, ring, and small finger proxomal phalanx fractures ? ?POST-OPERATIVE DIAGNOSIS:  Left index, middle, ring, and small finger proxomal phalanx fractures ? ?PROCEDURE:  Procedure(s): ?LEFT INDEX, MIDDLE, RING, AND SMALL FINGER CLOSED REDUCTION FINGER WITH PERCUTANEOUS PINNING (Left) ? ?SURGEON:  Surgeon(s) and Role: ?   * Sherilyn Cooter, MD - Primary ? ?PHYSICIAN ASSISTANT:  ? ?ASSISTANTS: none  ? ?ANESTHESIA:   regional and MAC ? ?EBL:  10  ? ?BLOOD ADMINISTERED:none ? ?DRAINS: none  ? ?LOCAL MEDICATIONS USED:  NONE ? ?SPECIMEN:  No Specimen ? ?DISPOSITION OF SPECIMEN:  N/A ? ?COUNTS:  YES ? ?TOURNIQUET:  * Missing tourniquet times found for documented tourniquets in log: [id number] * ? ?DICTATION: .Dragon Dictation ? ?PLAN OF CARE: Discharge from PACU ? ?PATIENT DISPOSITION:  PACU - hemodynamically stable. ?  ?Delay start of Pharmacological VTE agent (>24hrs) due to surgical blood loss or risk of bleeding: not applicable ? ?

## 2021-10-23 NOTE — Op Note (Signed)
? ?Date of Surgery: 10/23/2021 ? ?INDICATIONS: [name] is a 71 y.o.-year-old female with fractures of the left index, middle, ring, and small proximal phalangeal bases after a GLF several weeks ago.  There was significant hyper-extension deformities with pseudoclawing of multiple fingers and evidence of malrotation present.  Risks, benefits, and alternatives to surgery were again discussed with the patient wishing to proceed with surgery.  Informed consent was signed after our discussion.  ? ?PREOPERATIVE DIAGNOSIS:  ?Left index finger proximal phalanx fracture ?Left middle finger proximal phalanx fracture ?Left ring finger proximal phalanx fracture ?Left small finger proximal phalanx fracture ? ?POSTOPERATIVE DIAGNOSIS: Same. ? ?PROCEDURE:  ?Closed reduction and internal fixation of left index finger proximal phalanx ?Closed reduction and internal fixation of left middle finger proximal phalanx ?Closed reduction and internal fixation of left ring finger proximal phalanx ?Closed reduction and internal fixation of left left middle finger proximal phalanx ? ? ?SURGEON: Audria Nine, M.D. ? ?ASSIST:  ? ?ANESTHESIA:  Regional, MAC ? ?IV FLUIDS AND URINE: See anesthesia. ? ?ESTIMATED BLOOD LOSS: 10 mL. ? ?IMPLANTS:  ?Implant Name Type Inv. Item Serial No. Manufacturer Lot No. LRB No. Used Action  ?EXSOMED INFRAME IMPLANT 2.0 X 26 MM   [id number]  21130=08 Left 1 Implanted  ?EXSOMED INFRAME IMPLANT 2.0 X 14 MM   [id number]  21158=03 Left 1 Implanted  ?EXSOMED INFRAME IMPLANT 2.0 X 32MM   [id number]  21259=11 Left 1 Implanted  ?EXSOMED INFRAME IMPLANT 2.0  X 12MM   [id number]  20276-02 Left 1 Implanted  ?EXSOMED INFRAME IMPLANT 2.0X14MM   [id number]  21158-03 Left 1 Implanted  ?EXSOMED [facility] IMPLANT 2.0X36MM   [id number]  21130-13 Left 1 Implanted  ?EXSOMED INFRAME IMPLANT 2.0 X 24 MM   [id number]  21326-10 Left 1 Implanted and Explanted  ?EXSOMED INFRAME IMPLANT 2.0 X 72M Eloise Harman [id number]  21158-07 Left  1 Implanted  ?EXSOMED INFRAME IMPLANT 2.0 X 30MM   [id number]  21259=10 Left 1 Implanted  ?exsomed inframe inplant 2.0 x 18 mm   [id number]  21158-05 Left 1 Implanted  ?EXSOMED INFRAME IMPLANT 2.0 X 24 MM   [id number]  22096-13 Left 1 Implanted and Explanted  ?  ? ?DRAINS: None ? ?COMPLICATIONS: None ? ?DESCRIPTION OF PROCEDURE: The patient was met in the preoperative holding area where the surgical site was marked and the consent form was verified.  The patient was then taken to the operating room and transferred to the operating table.  All bony prominences were well padded.  A tourniquet was applied to the left upper arm.  Monitored sedation  was induced.  The operative extremity was prepped and draped in the usual and sterile fashion.  A formal time-out was performed to confirm that this was the correct patient, surgery, side, and site.  ? ?Following timeout, mini C-arm was brought in.  I began with the small finger.  The finger was closed reduced using combination of traction and flexion of the distal fragment.  The larger 0.062 end of the Xomed guidewire was then advanced from proximal to distal from the corner of the phalangeal base into the distal aspect of the phalanx.  AP and lateral views demonstrated appropriate reduction and correction of the previous hyperextension deformity.  There was no clinical malrotation present.  The measuring guide was used to determine the implant measurement.  The K wire was then advanced such that the 0.045 end of the K wire was crossing the fracture.  An Exsomed inframe 2.0 mm  micro nail was then advanced across the fracture.  The K wire was advanced appropriately such that the 0.045 end of the K wire exited the phalanx allowing complete advancement of the screw.  Care was taken to ensure that the proximal end of the screw was in the subchondral bone.  Next a second 0.062 K wire was advanced from proximal to distal.  The intramedullary canal was too tight to allow for a  "X" or "V" screw configuration.  A "Y" type configuration was used.  The guidewire was again measured.  An appropriate size screw was then advanced from proximal to distal taking care that the proximal end of the screw was in the subchondral bone. ? ?The same procedure was then completed for the ring, middle, and index fingers.  A long 2.0 mm micro nail was  advanced over a gudie wire followed by a second, shorter 2.0 mm micro nail in a "Y" type configuration given her small intramedullary canal size.   ? ?Final fluoroscopic imaging was obtained which demonstrated appropriate implant positioning all fingers.  The fractures were acceptably reduced in both the AP and lateral planes.  There was no evidence of clinical malrotation following fracture fixation. ? ?Following fracture fixation, the wounds were thoroughly cleaned with sterile saline and closed using 4-0 nylon sutures in a simple fashion.  The extremity was then dressed with xeroform, folded kerlix, cast padding, and a well padded dorsal splint. ? ?She was then reversed from sedation and transferred to the postoperative bed.  All counts were correct x 2 at the end of the procedure.  She was then taken to the PACU in stable condition.  ? ? ?POSTOPERATIVE PLAN: She will be discharged to home with appropriate pain medication and discharge instructions.  I will see her back in 10-14 days for her first postop visit.  ? ?Audria Nine, MD ?2:53 PM  ?

## 2021-10-23 NOTE — Anesthesia Preprocedure Evaluation (Addendum)
Anesthesia Evaluation  ?Patient identified by MRN, date of birth, ID band ?Patient awake ? ? ? ?Reviewed: ?Allergy & Precautions, NPO status , Patient's Chart, lab work & pertinent test results, reviewed documented beta blocker date and time  ? ?Airway ?Mallampati: II ? ?TM Distance: >3 FB ?Neck ROM: Full ? ? ? Dental ? ?(+) Teeth Intact, Dental Advisory Given, Caps,  ?  ?Pulmonary ?COPD, former smoker,  ?Hx/o Lung Ca - S/P RT ?  ?Pulmonary exam normal ?breath sounds clear to auscultation ? ? ? ? ? ? Cardiovascular ?hypertension, Pt. on medications and Pt. on home beta blockers ?Normal cardiovascular exam ?Rhythm:Regular Rate:Normal ? ?High coronary Ca score ?  ?Neuro/Psych ? Headaches, PSYCHIATRIC DISORDERS Anxiety Depression   ? GI/Hepatic ?negative GI ROS, (+)  ?  ? substance abuse ? alcohol use,   ?Endo/Other  ?Hypercholesterolemia ?Osteoporosis ? Renal/GU ?negative Renal ROS  ?negative genitourinary ?  ?Musculoskeletal ? ?(+) Arthritis , Osteoarthritis,  Fx's proximal phalanges right index, middle, ring and small fingers ? ?Hx/o melanoma inner canthus OD- S/P RT  ? Abdominal ?  ?Peds ? Hematology ? ?(+) Blood dyscrasia, anemia ,   ?Anesthesia Other Findings ? ? Reproductive/Obstetrics ? ?  ? ? ? ? ? ? ? ? ? ? ? ? ? ?  ?  ? ? ? ? ? ? ? ?Anesthesia Physical ?Anesthesia Plan ? ?ASA: 3 ? ?Anesthesia Plan: Regional and MAC  ? ?Post-op Pain Management: Regional block*  ? ?Induction: Intravenous ? ?PONV Risk Score and Plan: 2 and Treatment may vary due to age or medical condition and Propofol infusion ? ?Airway Management Planned: Natural Airway and Nasal Cannula ? ?Additional Equipment:  ? ?Intra-op Plan:  ? ?Post-operative Plan:  ? ?Informed Consent: I have reviewed the patients History and Physical, chart, labs and discussed the procedure including the risks, benefits and alternatives for the proposed anesthesia with the patient or authorized representative who has indicated  his/her understanding and acceptance.  ? ? ? ?Dental advisory given ? ?Plan Discussed with: CRNA and Anesthesiologist ? ?Anesthesia Plan Comments:   ? ? ? ? ? ? ?Anesthesia Quick Evaluation ? ?

## 2021-10-23 NOTE — Discharge Instructions (Addendum)
? ? ?Audria Nine, M.D. ?Hand Surgery ? ?POST-OPERATIVE DISCHARGE INSTRUCTIONS ? ? ?PRESCRIPTIONS: ?You have been given a prescription to be taken as directed for post-operative pain control.  You may also take over the counter ibuprofen/aleve and tylenol for pain. Take this as directed on the packaging. Do not exceed 3000 mg tylenol/acetaminophen in 24 hours. ? ?Ibuprofen 600-800 mg (3-4) tablets by mouth every 6 hours as needed for pain.  ?OR ?Aleve 2 tablets by mouth every 12 hours (twice daily) as needed for pain.  ?AND/OR ?Tylenol 1000 mg (2 tablets) every 8 hours as needed for pain. ? ?Please use your pain medication carefully, as refills are limited and you may not be provided with one.  As stated above, please use over the counter pain medicine - it will also be helpful with decreasing your swelling.  ? ? ?ANESTHESIA: ?After your surgery, post-surgical discomfort or pain is likely. This discomfort can last several days to a few weeks. At certain times of the day your discomfort may be more intense.  ? ?Did you receive a nerve block?  ?A nerve block can provide pain relief for one hour to two days after your surgery. As long as the nerve block is working, you will experience little or no sensation in the area the surgeon operated on.  ?As the nerve block wears off, you will begin to experience pain or discomfort. It is very important that you begin taking your prescribed pain medication before the nerve block fully wears off. Treating your pain at the first sign of the block wearing off will ensure your pain is better controlled and more tolerable when full-sensation returns. Do not wait until the pain is intolerable, as the medicine will be less effective. It is better to treat pain in advance than to try and catch up.  ? ?General Anesthesia:  ?If you did not receive a nerve block during your surgery, you will need to start taking your pain medication shortly after your surgery and should continue to do  so as prescribed by your surgeon.   ? ? ?ICE AND ELEVATION: ?Elevation, as much as possible for the next 48 hours, is critical for decreasing swelling as well as for pain relief. Elevation means when you are seated or lying down, you hand should be at or above your heart. When walking, the hand needs to be at or above the level of your elbow.  ?If the bandage gets too tight, it may need to be loosened. Please contact our office and we will instruct you in how to do this.  ? ? ?SURGICAL BANDAGES:  ?Keep your dressing and/or splint clean and dry at all times.  Do not remove until you are seen again in the office.  If careful, you may place a plastic bag over your bandage and tape the end to shower, but be careful, do not get your bandages wet.  ?  ? ?HAND THERAPY:  ?You may not need any. If you do, we will begin this at your follow up visit in the clinic.  ? ? ?ACTIVITY AND WORK: ?You might miss a variable period of time from work and hopefully this issue has been discussed prior to surgery. You may not do any heavy work with your affected hand for about 2 weeks.  ? ? ?[facility] ?[address] ?[place],  [place]  [address] ?[phone]  ? ? ? ? ? ?Post Anesthesia Home Care Instructions ? ?Activity: ?Get plenty of rest for  the remainder of the day. A responsible individual must stay with you for 24 hours following the procedure.  ?For the next 24 hours, DO NOT: ?-Drive a car ?-Paediatric nurse ?-Drink alcoholic beverages ?-Take any medication unless instructed by your physician ?-Make any legal decisions or sign important papers. ? ?Meals: ?Start with liquid foods such as gelatin or soup. Progress to regular foods as tolerated. Avoid greasy, spicy, heavy foods. If nausea and/or vomiting occur, drink only clear liquids until the nausea and/or vomiting subsides. Call your physician if vomiting continues. ? ?Special Instructions/Symptoms: ?Your throat may feel dry or sore from the anesthesia or  the breathing tube placed in your throat during surgery. If this causes discomfort, gargle with warm salt water. The discomfort should disappear within 24 hours. ? ?If you had a scopolamine patch placed behind your ear for the management of post- operative nausea and/or vomiting: ? ?1. The medication in the patch is effective for 72 hours, after which it should be removed.  Wrap patch in a tissue and discard in the trash. Wash hands thoroughly with soap and water. ?2. You may remove the patch earlier than 72 hours if you experience unpleasant side effects which may include dry mouth, dizziness or visual disturbances. ?3. Avoid touching the patch. Wash your hands with soap and water after contact with the patch. ?    ? ? ?Regional Anesthesia Blocks ? ?1. Numbness or the inability to move the "blocked" extremity may last from 3-48 hours after placement. The length of time depends on the medication injected and your individual response to the medication. If the numbness is not going away after 48 hours, call your surgeon. ? ?2. The extremity that is blocked will need to be protected until the numbness is gone and the  Strength has returned. Because you cannot feel it, you will need to take extra care to avoid injury. Because it may be weak, you may have difficulty moving it or using it. You may not know what position it is in without looking at it while the block is in effect. ? ?3. For blocks in the legs and feet, returning to weight bearing and walking needs to be done carefully. You will need to wait until the numbness is entirely gone and the strength has returned. You should be able to move your leg and foot normally before you try and bear weight or walk. You will need someone to be with you when you first try to ensure you do not fall and possibly risk injury. ? ?4. Bruising and tenderness at the needle site are common side effects and will resolve in a few days. ? ?5. Persistent numbness or new problems with  movement should be communicated to the surgeon or the [facility] [phone] [place] [phone]).  ? ?

## 2021-10-23 NOTE — Anesthesia Procedure Notes (Signed)
Anesthesia Regional Block: Supraclavicular block  ? ?Pre-Anesthetic Checklist: , timeout performed,  Correct Patient, Correct Site, Correct Laterality,  Correct Procedure, Correct Position, site marked,  Risks and benefits discussed,  Surgical consent,  Pre-op evaluation,  At surgeon's request ? ?Laterality: Left ? ?Prep: chloraprep     ?  ?Needles:  ?Injection technique: Single-shot ? ?Needle Type: Echogenic Stimulator Needle   ? ? ?Needle Length: 9cm  ?Needle Gauge: 21  ? ?Needle insertion depth: 6 cm ? ? ?Additional Needles: ? ? ?Procedures:,,,, ultrasound used (permanent image in chart),,   ?Motor weakness within 5 minutes.  ?Narrative:  ?Start time: 10/23/2021 10:37 AM ?End time: 10/23/2021 10:42 AM ?Injection made incrementally with aspirations every 5 mL. ? ?Performed by: Personally  ?Anesthesiologist: Josephine Igo, MD ? ?Additional Notes: ?Timeout performed. Patient sedated. Relevant anatomy ID'd using [place]. Incremental 2-40ml injection of LA with frequent aspiration. Patient tolerated procedure well. ? ? ? ?Left Supraclavicular Block ? ?

## 2021-10-24 ENCOUNTER — Telehealth: Payer: Self-pay | Admitting: Orthopedic Surgery

## 2021-10-24 ENCOUNTER — Other Ambulatory Visit: Payer: Self-pay | Admitting: Orthopedic Surgery

## 2021-10-24 DIAGNOSIS — S62619D Displaced fracture of proximal phalanx of unspecified finger, subsequent encounter for fracture with routine healing: Secondary | ICD-10-CM

## 2021-10-24 NOTE — Telephone Encounter (Signed)
Patient says that her arm is swollen all way to her shoulder. Says the cast is heavy and it falls down. Says it is heavy. Would like someone to call her. Her call back number is [phone] ?

## 2021-10-30 ENCOUNTER — Telehealth: Payer: Self-pay | Admitting: Orthopedic Surgery

## 2021-10-30 NOTE — Telephone Encounter (Signed)
Pt's sister [name] called requesting a call back concerning sister's customized cast she has been waiting to get. Please call pt's sister [name]. She stated Dr. Tempie Donning said pt would get customized cast a week after surgery. Please call pt about this matter as soon as possible. Phone number is [phone]. ?

## 2021-10-30 NOTE — Telephone Encounter (Signed)
Could you please advise?  Patient has post op appt scheduled with you on 11/05/2021. ?

## 2021-11-01 ENCOUNTER — Encounter (HOSPITAL_BASED_OUTPATIENT_CLINIC_OR_DEPARTMENT_OTHER): Payer: Self-pay | Admitting: Orthopedic Surgery

## 2021-11-01 NOTE — Telephone Encounter (Signed)
Contacted patient's sister and she has been scheduled to see physical therapy on 3/14 at 2:00pm prior to her office visit with Dr. Tempie Donning.  ?

## 2021-11-05 ENCOUNTER — Other Ambulatory Visit: Payer: Self-pay

## 2021-11-05 ENCOUNTER — Ambulatory Visit (INDEPENDENT_AMBULATORY_CARE_PROVIDER_SITE_OTHER): Payer: Medicare Other | Admitting: Rehabilitative and Restorative Service Providers"

## 2021-11-05 ENCOUNTER — Ambulatory Visit: Payer: Self-pay

## 2021-11-05 ENCOUNTER — Ambulatory Visit (INDEPENDENT_AMBULATORY_CARE_PROVIDER_SITE_OTHER): Payer: Medicare Other | Admitting: Orthopedic Surgery

## 2021-11-05 ENCOUNTER — Encounter: Payer: Self-pay | Admitting: Rehabilitative and Restorative Service Providers"

## 2021-11-05 DIAGNOSIS — M79642 Pain in left hand: Secondary | ICD-10-CM

## 2021-11-05 DIAGNOSIS — R600 Localized edema: Secondary | ICD-10-CM

## 2021-11-05 DIAGNOSIS — M6281 Muscle weakness (generalized): Secondary | ICD-10-CM

## 2021-11-05 DIAGNOSIS — S62619D Displaced fracture of proximal phalanx of unspecified finger, subsequent encounter for fracture with routine healing: Secondary | ICD-10-CM

## 2021-11-05 DIAGNOSIS — M25642 Stiffness of left hand, not elsewhere classified: Secondary | ICD-10-CM | POA: Diagnosis not present

## 2021-11-05 NOTE — Therapy (Signed)
?OUTPATIENT OCCUPATIONAL THERAPY ORTHO EVALUATION ? ?Patient Name: [name] [name] ?MRN: [id number] ?DOB:[birth year], 71 y.o., female ?Today's Date: 11/05/2021 ? ?PCP: Colon Branch, MD ?REFERRING PROVIDER: Sherilyn Cooter, MD ? ? OT End of Session - 11/05/21 1455   ? ? Visit Number 1   ? Number of Visits 14   ? Date for OT Re-Evaluation 12/27/21   ? Authorization Type Medicare   ? Progress Note Due on Visit 10   ? OT Start Time 1456   ? OT Stop Time 1606   ? OT Time Calculation (min) 70 min   ? Equipment Utilized During Treatment orthotic materials   ? Activity Tolerance Patient tolerated treatment well;No increased pain;Patient limited by pain   ? ?  ?  ? ?  ? ? ?Past Medical History:  ?Diagnosis Date  ? Anemia   ? duering cancer treatment   ? Anxiety   ? Arthritis   ? OA hands   ? Bilateral lower extremity pain   ? Blood transfusion without reported diagnosis   ? during chemo for lung cancer  ? Cataract   ? forming  but very small   ? Cholelithiasis   ? Colon polyps   ? adenomatous  ? Concussion 08/25/2021  ? ETOHdependence [facility])   ? High cholesterol   ? History of chemotherapy   ? for lung cancer- completed 2013  ? Hx of radiation therapy 10/07/11 to 11/20/11  ? right lung  ? Hx of radiation therapy 01/07/2012- 01/21/12  ? cranial irradiation  ? Hypertension   ? Lung cancer ([place]) 09/19/2011  ? Stage III currently in remission  ? Malignant melanoma of skin of canthus of right eye ([place]) 02/2017  ? Osteoporosis 04/2014  ? Dexa scan by Dr. Corinna Capra, T-score -2.7, next in 2 years, with at least 13 % major fracture in 10 years  ? Pitting edema   ? Bilateral lower extremities, duplex ultrasound 04/03/2015 normal, no DVT  ? ?Past Surgical History:  ?Procedure Laterality Date  ? CHOLECYSTECTOMY  04/2011  ? biliary stent placement  ? CLOSED REDUCTION FINGER WITH PERCUTANEOUS PINNING Left 10/23/2021  ? Procedure: LEFT INDEX, MIDDLE, RING, AND SMALL FINGER CLOSED REDUCTION FINGER WITH PERCUTANEOUS PINNING;  Surgeon: Sherilyn Cooter, MD;  Location: [place];  Service: Orthopedics;  Laterality: Left;  ? COLONOSCOPY    ? [place] REPAIR  2015  ? MOHS SURGERY Right 02/2017  ? cheek and eyelid  ? Josph Macho Touch    ? Vaginal procedure  ? POLYPECTOMY    ? TUBAL LIGATION    ? ?Patient Active Problem List  ? Diagnosis Date Noted  ? Multiple closed fractures of proximal phalanx of finger with routine healing 10/08/2021  ? EtOH dependence ([place]) 09/11/2021  ? Vitamin B 12 deficiency 09/11/2021  ? Post-traumatic headache 09/11/2021  ? Insomnia 09/11/2021  ? Post concussion syndrome 08/30/2021  ? Benign essential HTN   ? Hyperglycemia   ? Urinary incontinence   ? Pelvic pain   ? Closed pelvic fracture ([place]) 08/03/2020  ? Closed fracture of left superior pubic ramus ([place]) 07/30/2020  ? Left hip pain 07/30/2020  ? GAD (generalized anxiety disorder) 07/30/2020  ? DJD (degenerative joint disease) 09/20/2019  ? Familial hyperlipidemia 07/01/2018  ? Right shoulder pain 07/14/2017  ? Malignant melanoma of skin of canthus of right eye ([place]) 03/16/2017  ? PCP NOTES >>>>>>>>>>>>>>> 05/22/2015  ? High coronary artery calcium score 02/14/2015  ? Hernia, abdominal 04/01/2013  ? Depression  with anxiety 04/01/2012  ? Malignant neoplasm of upper lobe, bronchus or lung 09/30/2011  ? H/O: lung cancer 09/25/2011  ? Annual physical exam 09/05/2011  ? IBS -diarrhea predominant 09/05/2011  ? COPD (chronic obstructive pulmonary disease) ([place]) 09/05/2011  ? Osteoporosis 03/01/2010  ? TOBACCO ABUSE 11/14/2008  ? Dyslipidemia 05/18/2007  ? Essential hypertension 05/18/2007  ? ? ?ONSET DATE: 10/23/21 DOS, 10/03/21 DOI ? ?REFERRING DIAG: S62.619D (ICD-10-CM) - Multiple closed fractures of proximal phalanx of finger with routine healing ? ?THERAPY DIAG:  ?Pain in left hand - Plan: Ot plan of care cert/re-cert ? ?Localized edema - Plan: Ot plan of care cert/re-cert ? ?Muscle weakness (generalized) - Plan: Ot plan of care cert/re-cert ? ?Stiffness of left  hand, not elsewhere classified - Plan: Ot plan of care cert/re-cert ? ?SUBJECTIVE:  ? ?SUBJECTIVE STATEMENT: ?She arrives with sister who states she is a caregiver, but does not live with the patient. The pt states tripping, falling, landing on left hand one Feb night, and she waited about 12 hours and went to ED when it wasn't better the next morning. She just had sutures removed today and is 2 weeks post-op. Had bulky cast removed and needs a protective orthotic. She states having the bulky dressing off is much better for pain, but she is very stiff. ? ? ?PERTINENT HISTORY: Per MD: "Status post screw fixation of left index, middle, ring, small fingers.  Early mobilization per [place] protocol for screws/plates." ? ?PRECAUTIONS: ~2 weeks out from sx now (A/PROM, blocking, dynamics ok) ? ?WEIGHT BEARING RESTRICTIONS Yes NWB Lt hand now ? ?PAIN:  ?Are you having pain? None at rest now, trying to move hurts 3/10 ? ?FALLS: Has patient fallen in last 6 months? Yes, Number of falls: 2 or 3 times  ? ?LIVING ENVIRONMENT: ?Lives with: lives alone ?Lives in: House/apartment ?Stairs: No ?Has following equipment at home: shower chair and Grab bars ? ?PLOF: Independent, Independent with basic ADLs, and Needs assistance with homemaking ? ?PATIENT GOALS: She states she wants to get hand working better.  ? ?OBJECTIVE:  ? ?HAND DOMINANCE: Right ? ?ADLs: ?Overall ADLs: She states the things that bothers her the most are opening containers, purse, carrying things, etc.  ? ?FUNCTIONAL OUTCOME MEASURES: ?Quick Dash: 70% impairment ? ?UE ROM   2.8cm gap from pad thumb to pad SF, cannot make fist, can abd/add slightly (~10*). MF is most sore/painful and was taken for example of all stiff fingers. TAM in Lt MF is 29* today.  ? ?A/ROM Left ?11/05/21  ?Thumb MCP (0-60)   ?Thumb IP (0-80)    ?Thumb Radial abd/add (0-55)    ?Thumb Palmar abd/add (0-45)    ?Thumb opposition to small finger    ?Index MCP (0-90)    ?Index PIP (0-100)    ?Index  DIP (0-70)    ?Long MCP (0-90)  (+10)- 0  ?Long PIP (0-100)  (-39) - 54  ?Long DIP (0-70)  (+5) - 0  ?Ring MCP (0-90)    ?Ring PIP (0-100)    ?Ring DIP (0-70)    ?Little MCP (0-90)    ?Little PIP (0-100)    ?Little DIP (0-70)    ?(Blank rows = not tested)  ? ?Active ROM Right ?11/05/2021 Left ?11/05/2021  ?Wrist flexion 65 55  ?Wrist extension 55 15  ?Wrist ulnar deviation    ?Wrist radial deviation    ?Wrist pronation    ?Wrist supination    ?(Blank rows = not tested) ? ? ?UE  MMT:   NT at eval but at least 3-/5 MMT  ? ? ?HAND FUNCTION: ?Grip strength: Right: TBD lbs; Left: TBD lbs ? ?COORDINATION: ?9 Hole Peg test: Right: TBD sec; Left: TBD sec ?Box and Blocks:  Right TBDblocks, Left TBDblocks ?Pt with overt stiffness and no functional use of Lt hand, can't make a fist, etc. Impaired gross and FMS today.  ? ?SENSATION: ?Intact but decreased LT to all fingers, but states feels some paresthesia  ? ?EDEMA: diffuse through fingers, hand, wrist lt side  ? ?COGNITION: ?Overall cognitive status: Difficulty to assess and she was somewhat tangential, apparent memory issues and somewhat impulsive. She made some strange comments today as well. ?Areas of impairment: Memory: Deficits mild ?Awareness: Deficits some lack of safety awareness ?Problem solving: Deficits needs cues and assist ?Behavior: Within functional limits and Impulsive ? ?OBSERVATIONS: At one point she absent mindedly pushed against her seat with left hand to shift weight, felt no pain, states "that's cool," and attempts again. OT recommends no weight bearing at this time ? ? ?TODAY'S TREATMENT:  ?11/05/21 Eval: Custom orthotic fabrication was indicated due to pt's healing fx sites and need for safe, functional positioning. OT fabricated custom FA based SAFE position orthotic for pt today to protect healing fxs and provide proper tension to tendons when at rest. Due to stiffness, only about 30-40* MCP flexion could be passively achieved today, we will try to  progress this in future sessions. IP Js straight and wrist in ~15* ext, immobilized. It fit well with no areas of pressure, pt states a comfortable fit. Pt was educated on the wearing schedule (off for hygiene and

## 2021-11-05 NOTE — Progress Notes (Signed)
? ?Post-Op Visit Note ?  ?Patient: [name] [name]           ?Date of Birth: [birth year]           ?MRN: [id number] ?Visit Date: 11/05/2021 ?PCP: Colon Branch, MD ? ? ?Assessment & Plan: ? ?Chief Complaint:  ?Chief Complaint  ?Patient presents with  ? Left Hand - Routine Post Op  ? ?Visit Diagnoses:  ?1. Multiple closed fractures of proximal phalanx of finger with routine healing   ? ? ?Plan: Patient is now two weeks s/p closed reduction and screw fixation of her index, middle, ring, and small finger proximal phalanges.  There was an issue with scheduling and her first hand therapy visit is today rather than immediately postoperatively.  She is quite stiff and will need to work hard on ROM.  Her small incisions are well healed and the sutures removed. I will see her back in two weeks to see how she's doing in therapy.  ? ?Follow-Up Instructions: No follow-ups on file.  ? ?Orders:  ?Orders Placed This Encounter  ?Procedures  ? XR Hand Complete Left  ? ?No orders of the defined types were placed in this encounter. ? ? ?Imaging: ?No results found. ? ?PMFS History: ?Patient Active Problem List  ? Diagnosis Date Noted  ? Multiple closed fractures of proximal phalanx of finger with routine healing 10/08/2021  ? EtOH dependence ([place]) 09/11/2021  ? Vitamin B 12 deficiency 09/11/2021  ? Post-traumatic headache 09/11/2021  ? Insomnia 09/11/2021  ? Post concussion syndrome 08/30/2021  ? Benign essential HTN   ? Hyperglycemia   ? Urinary incontinence   ? Pelvic pain   ? Closed pelvic fracture ([place]) 08/03/2020  ? Closed fracture of left superior pubic ramus ([place]) 07/30/2020  ? Left hip pain 07/30/2020  ? GAD (generalized anxiety disorder) 07/30/2020  ? DJD (degenerative joint disease) 09/20/2019  ? Familial hyperlipidemia 07/01/2018  ? Right shoulder pain 07/14/2017  ? Malignant melanoma of skin of canthus of right eye ([place]) 03/16/2017  ? PCP NOTES >>>>>>>>>>>>>>> 05/22/2015  ? High coronary artery calcium score 02/14/2015   ? Hernia, abdominal 04/01/2013  ? Depression with anxiety 04/01/2012  ? Malignant neoplasm of upper lobe, bronchus or lung 09/30/2011  ? H/O: lung cancer 09/25/2011  ? Annual physical exam 09/05/2011  ? IBS -diarrhea predominant 09/05/2011  ? COPD (chronic obstructive pulmonary disease) ([place]) 09/05/2011  ? Osteoporosis 03/01/2010  ? TOBACCO ABUSE 11/14/2008  ? Dyslipidemia 05/18/2007  ? Essential hypertension 05/18/2007  ? ?Past Medical History:  ?Diagnosis Date  ? Anemia   ? duering cancer treatment   ? Anxiety   ? Arthritis   ? OA hands   ? Bilateral lower extremity pain   ? Blood transfusion without reported diagnosis   ? during chemo for lung cancer  ? Cataract   ? forming  but very small   ? Cholelithiasis   ? Colon polyps   ? adenomatous  ? Concussion 08/25/2021  ? [facility]TO[facility])   ? High cholesterol   ? History of chemotherapy   ? for lung cancer- completed 2013  ? Hx of radiation therapy 10/07/11 to 11/20/11  ? right lung  ? Hx of radiation therapy 01/07/2012- 01/21/12  ? cranial irradiation  ? Hypertension   ? Lung cancer ([place]) 09/19/2011  ? Stage III currently in remission  ? Malignant melanoma of skin of canthus of right eye ([place]) 02/2017  ? Osteoporosis 04/2014  ? Dexa scan by Dr. Corinna Capra, T-score -  2.7, next in 2 years, with at least 13 % major fracture in 10 years  ? Pitting edema   ? Bilateral lower extremities, duplex ultrasound 04/03/2015 normal, no DVT  ?  ?Family History  ?Problem Relation Age of Onset  ? Bladder Cancer Father   ? Stroke Father   ? Colon polyps Father   ? Atrial fibrillation Mother   ? Heart disease Mother   ?     CHF  ? Clotting disorder Mother   ?     warfarin  ? Colon polyps Mother   ? Colon cancer Maternal Aunt 34  ? Drug abuse Son   ? Breast cancer Neg Hx   ? Esophageal cancer Neg Hx   ? Stomach cancer Neg Hx   ? Rectal cancer Neg Hx   ?  ?Past Surgical History:  ?Procedure Laterality Date  ? CHOLECYSTECTOMY  04/2011  ? biliary stent placement  ? CLOSED REDUCTION FINGER WITH  PERCUTANEOUS PINNING Left 10/23/2021  ? Procedure: LEFT INDEX, MIDDLE, RING, AND SMALL FINGER CLOSED REDUCTION FINGER WITH PERCUTANEOUS PINNING;  Surgeon: Sherilyn Cooter, MD;  Location: [place];  Service: Orthopedics;  Laterality: Left;  ? COLONOSCOPY    ? [place] REPAIR  2015  ? MOHS SURGERY Right 02/2017  ? cheek and eyelid  ? Josph Macho Touch    ? Vaginal procedure  ? POLYPECTOMY    ? TUBAL LIGATION    ? ?Social History  ? ?Occupational History  ? Occupation: retired, Scientist, research (medical)  ?Tobacco Use  ? Smoking status: Former  ?  Types: Cigarettes  ?  Quit date: 09/08/2011  ?  Years since quitting: 10.1  ? Smokeless tobacco: Never  ?Vaping Use  ? Vaping Use: Never used  ?Substance and Sexual Activity  ? Alcohol use: Yes  ?  Comment: daily bottle of wine  ? Drug use: No  ? Sexual activity: Not on file  ? ? ? ?

## 2021-11-11 ENCOUNTER — Other Ambulatory Visit: Payer: Self-pay

## 2021-11-11 ENCOUNTER — Ambulatory Visit (INDEPENDENT_AMBULATORY_CARE_PROVIDER_SITE_OTHER): Payer: Medicare Other | Admitting: Rehabilitative and Restorative Service Providers"

## 2021-11-11 DIAGNOSIS — M25642 Stiffness of left hand, not elsewhere classified: Secondary | ICD-10-CM | POA: Diagnosis not present

## 2021-11-11 DIAGNOSIS — M6281 Muscle weakness (generalized): Secondary | ICD-10-CM

## 2021-11-11 DIAGNOSIS — R600 Localized edema: Secondary | ICD-10-CM | POA: Diagnosis not present

## 2021-11-11 DIAGNOSIS — M79642 Pain in left hand: Secondary | ICD-10-CM

## 2021-11-11 NOTE — Therapy (Signed)
?OUTPATIENT OCCUPATIONAL THERAPY TREATMENT NOTE ? ? ?Patient Name: [name] [name] ?MRN: [id number] ?DOB:Aug 22, 1951, 71 y.o., female ?Today's Date: 11/11/2021 ? ?PCP: Colon Branch, MD ?REFERRING PROVIDER: Bary Leriche, PA-C ? ? OT End of Session - 11/11/21 1426   ? ? Visit Number 2   ? Number of Visits 14   ? Date for OT Re-Evaluation 12/27/21   ? Authorization Type Medicare   ? Progress Note Due on Visit 10   ? OT Start Time 1426   ? OT Stop Time 1541   ? OT Time Calculation (min) 75 min   ? Equipment Utilized During Treatment orthotic materials   ? Activity Tolerance Patient tolerated treatment well;No increased pain;Patient limited by pain   ? ?  ?  ? ?  ? ? ?Past Medical History:  ?Diagnosis Date  ? Anemia   ? duering cancer treatment   ? Anxiety   ? Arthritis   ? OA hands   ? Bilateral lower extremity pain   ? Blood transfusion without reported diagnosis   ? during chemo for lung cancer  ? Cataract   ? forming  but very small   ? Cholelithiasis   ? Colon polyps   ? adenomatous  ? Concussion 08/25/2021  ? [facility]TO[facility])   ? High cholesterol   ? History of chemotherapy   ? for lung cancer- completed 2013  ? Hx of radiation therapy 10/07/11 to 11/20/11  ? right lung  ? Hx of radiation therapy 01/07/2012- 01/21/12  ? cranial irradiation  ? Hypertension   ? Lung cancer ([place]) 09/19/2011  ? Stage III currently in remission  ? Malignant melanoma of skin of canthus of right eye ([place]) 02/2017  ? Osteoporosis 04/2014  ? Dexa scan by Dr. Corinna Capra, T-score -2.7, next in 2 years, with at least 13 % major fracture in 10 years  ? Pitting edema   ? Bilateral lower extremities, duplex ultrasound 04/03/2015 normal, no DVT  ? ?Past Surgical History:  ?Procedure Laterality Date  ? CHOLECYSTECTOMY  04/2011  ? biliary stent placement  ? CLOSED REDUCTION FINGER WITH PERCUTANEOUS PINNING Left 10/23/2021  ? Procedure: LEFT INDEX, MIDDLE, RING, AND SMALL FINGER CLOSED REDUCTION FINGER WITH PERCUTANEOUS PINNING;  Surgeon: Sherilyn Cooter, MD;  Location: [place];  Service: Orthopedics;  Laterality: Left;  ? COLONOSCOPY    ? [place] REPAIR  2015  ? MOHS SURGERY Right 02/2017  ? cheek and eyelid  ? Josph Macho Touch    ? Vaginal procedure  ? POLYPECTOMY    ? TUBAL LIGATION    ? ?Patient Active Problem List  ? Diagnosis Date Noted  ? Multiple closed fractures of proximal phalanx of finger with routine healing 10/08/2021  ? EtOH dependence ([place]) 09/11/2021  ? Vitamin B 12 deficiency 09/11/2021  ? Post-traumatic headache 09/11/2021  ? Insomnia 09/11/2021  ? Post concussion syndrome 08/30/2021  ? Benign essential HTN   ? Hyperglycemia   ? Urinary incontinence   ? Pelvic pain   ? Closed pelvic fracture ([place]) 08/03/2020  ? Closed fracture of left superior pubic ramus ([place]) 07/30/2020  ? Left hip pain 07/30/2020  ? GAD (generalized anxiety disorder) 07/30/2020  ? DJD (degenerative joint disease) 09/20/2019  ? Familial hyperlipidemia 07/01/2018  ? Right shoulder pain 07/14/2017  ? Malignant melanoma of skin of canthus of right eye ([place]) 03/16/2017  ? PCP NOTES >>>>>>>>>>>>>>> 05/22/2015  ? High coronary artery calcium score 02/14/2015  ? Hernia, abdominal 04/01/2013  ?  Depression with anxiety 04/01/2012  ? Malignant neoplasm of upper lobe, bronchus or lung 09/30/2011  ? H/O: lung cancer 09/25/2011  ? Annual physical exam 09/05/2011  ? IBS -diarrhea predominant 09/05/2011  ? COPD (chronic obstructive pulmonary disease) ([place]) 09/05/2011  ? Osteoporosis 03/01/2010  ? TOBACCO ABUSE 11/14/2008  ? Dyslipidemia 05/18/2007  ? Essential hypertension 05/18/2007  ? ?ONSET DATE: 10/23/21 DOS, 10/03/21 DOI ?  ?REFERRING DIAG: S62.619D (ICD-10-CM) - Multiple closed fractures of proximal phalanx of finger with routine healing ? ?THERAPY DIAG:  ?Stiffness of left hand, not elsewhere classified ? ?Pain in left hand ? ?Localized edema ? ?Muscle weakness (generalized) ? ? ?PERTINENT HISTORY: Per MD: "Status post screw fixation of left index, middle,  ring, small fingers.  Early mobilization per [place] protocol for screws/plates." ? ?PRECAUTIONS: ~3 weeks out from sx now (A/PROM, blocking, dynamics ok) ? ?SUBJECTIVE:  ?She arrives with sister again, states orthotic is fitting well, no pressure (but it still needs adjusted to get MCP Js more bent and IP Js straighter). She states HEP is "hurting" but then describes "pulling" and "stiffness" and no sharp pain.  Sister states that the pt has been confused and not doing HEP at least 5 x day as asked.  ? ?PAIN:  ?Are you having pain? None now at rest per pt ? ? ?OBJECTIVE: (All objective assessments below are from initial evaluation on: 11/05/21 unless otherwise specified.)  ? ? ?HAND DOMINANCE: Right ?  ?ADLs: ?Overall ADLs: She states the things that bothers her the most are opening containers, purse, carrying things, etc.  ?  ?FUNCTIONAL OUTCOME MEASURES: ?Quick Dash: 70% impairment ?  ?UE ROM   2.8cm gap from pad thumb to pad SF, cannot make fist, can abd/add slightly (~10*). MF is most sore/painful and was taken for example of all stiff fingers. TAM in Lt MF is 29* today.  ?  ?A/ROM Left ?11/05/21  ?Thumb MCP (0-60)    ?Thumb IP (0-80)    ?Thumb Radial abd/add (0-55)    ?Thumb Palmar abd/add (0-45)    ?Thumb opposition to small finger    ?Index MCP (0-90)    ?Index PIP (0-100)    ?Index DIP (0-70)    ?Long MCP (0-90)  (+10)- 0  ?Long PIP (0-100)  (-39) - 54  ?Long DIP (0-70)  (+5) - 0  ?Ring MCP (0-90)    ?Ring PIP (0-100)    ?Ring DIP (0-70)    ?Little MCP (0-90)    ?Little PIP (0-100)    ?Little DIP (0-70)    ?(Blank rows = not tested)  ?  ?Active ROM Right ?11/05/2021 Left ?11/05/2021  ?Wrist flexion 65 55  ?Wrist extension 55 15  ?Wrist ulnar deviation      ?Wrist radial deviation      ?Wrist pronation      ?Wrist supination      ?(Blank rows = not tested) ?  ?  ?UE MMT:   NT at eval but at least 3-/5 MMT  ?  ?  ?HAND FUNCTION: ?Grip strength: Right: TBD lbs; Left: TBD lbs ?  ?COORDINATION: ?9 Hole Peg test:  Right: TBD sec; Left: TBD sec ?Box and Blocks:  Right TBDblocks, Left TBDblocks ?Pt with overt stiffness and no functional use of Lt hand, can't make a fist, etc. Impaired gross and FMS today.  ?  ?SENSATION: ?Intact but decreased LT to all fingers, but states feels some paresthesia  ?  ?EDEMA: diffuse through fingers, hand, wrist lt side  ?  ?  COGNITION: ?Overall cognitive status: Difficulty to assess and she was somewhat tangential, apparent memory issues and somewhat impulsive. She made some strange comments today as well. ?Areas of impairment: Memory: Deficits mild ?Awareness: Deficits some lack of safety awareness ?Problem solving: Deficits needs cues and assist ?Behavior: Within functional limits and Impulsive ?  ?OBSERVATIONS: At one point she absent mindedly pushed against her seat with left hand to shift weight, felt no pain, states "that's cool," and attempts again. OT recommends no weight bearing at this time ?  ?  ?TODAY'S TREATMENT:  ?11/11/21: Due to pt having more flexible MCP Js today and also the start of what seems to be PIP J stiffness, OT adjusts SAFE orthotic to have more MCP flexion and to apply pressure along PIP Js to limit allowable flexion at PIPs. She states this fits well and does verbalize some confusion about when to wear. OT writes again to wear at all times except for hygiene and exercises 5-8 x day.  ? ?-Due to PIP J flexion contracture/stiffness concerns, OT fabricates new orthosis for training purposes- to resist MCP ext (hold at ~25* flexion) and allow for blocking AROM at IP Js in ext. She is edu to do 10x for 5 sec, 5-8x day, using AROM with overpressure to help increase IP J extension. She demo's back well with no pain.  Orthotic was well padded to help prevent pressure on P1s.  ? ?-Additionally, OT edu on light fnl activities for her to begin, like folding clothing, eating with a spoon, picking up puzzle pieces, etc. She states "only likes to watch tv," which is a sedate,  functional barrier to encourage motion. She uses peg board to pick up, manipulate pieces, lightly press into board with thumb or left hand.  ? ?-Lastly, OT re-educated on HEP, has pt perform AROM in full fist, exte

## 2021-11-13 ENCOUNTER — Encounter: Payer: Medicare Other | Admitting: Rehabilitative and Restorative Service Providers"

## 2021-11-14 ENCOUNTER — Other Ambulatory Visit: Payer: Self-pay

## 2021-11-14 ENCOUNTER — Ambulatory Visit (INDEPENDENT_AMBULATORY_CARE_PROVIDER_SITE_OTHER): Payer: Medicare Other | Admitting: Rehabilitative and Restorative Service Providers"

## 2021-11-14 DIAGNOSIS — M79642 Pain in left hand: Secondary | ICD-10-CM | POA: Diagnosis not present

## 2021-11-14 DIAGNOSIS — M25642 Stiffness of left hand, not elsewhere classified: Secondary | ICD-10-CM | POA: Diagnosis not present

## 2021-11-14 DIAGNOSIS — Z20822 Contact with and (suspected) exposure to covid-19: Secondary | ICD-10-CM | POA: Diagnosis not present

## 2021-11-14 DIAGNOSIS — M6281 Muscle weakness (generalized): Secondary | ICD-10-CM | POA: Diagnosis not present

## 2021-11-14 DIAGNOSIS — R600 Localized edema: Secondary | ICD-10-CM

## 2021-11-14 NOTE — Therapy (Signed)
?OUTPATIENT OCCUPATIONAL THERAPY TREATMENT NOTE ? ? ?Patient Name: [name] [name] ?MRN: [id number] ?DOB:1951-05-08, 71 y.o., female ?Today's Date: 11/14/2021 ? ?PCP: Colon Branch, MD ?REFERRING PROVIDER: Sherilyn Cooter, MD ? ? OT End of Session - 11/14/21 1503   ? ? Visit Number 3   ? Number of Visits 14   ? Date for OT Re-Evaluation 12/27/21   ? Authorization Type Medicare   ? Progress Note Due on Visit 10   ? OT Start Time 1505   ? OT Stop Time 1601   ? OT Time Calculation (min) 56 min   ? Equipment Utilized During Treatment orthotic materials   ? Activity Tolerance Patient tolerated treatment well;No increased pain;Patient limited by pain   ? ?  ?  ? ?  ? ? ? ?Past Medical History:  ?Diagnosis Date  ? Anemia   ? duering cancer treatment   ? Anxiety   ? Arthritis   ? OA hands   ? Bilateral lower extremity pain   ? Blood transfusion without reported diagnosis   ? during chemo for lung cancer  ? Cataract   ? forming  but very small   ? Cholelithiasis   ? Colon polyps   ? adenomatous  ? Concussion 08/25/2021  ? ETOHdependence [facility])   ? High cholesterol   ? History of chemotherapy   ? for lung cancer- completed 2013  ? Hx of radiation therapy 10/07/11 to 11/20/11  ? right lung  ? Hx of radiation therapy 01/07/2012- 01/21/12  ? cranial irradiation  ? Hypertension   ? Lung cancer ([place]) 09/19/2011  ? Stage III currently in remission  ? Malignant melanoma of skin of canthus of right eye ([place]) 02/2017  ? Osteoporosis 04/2014  ? Dexa scan by Dr. Corinna Capra, T-score -2.7, next in 2 years, with at least 13 % major fracture in 10 years  ? Pitting edema   ? Bilateral lower extremities, duplex ultrasound 04/03/2015 normal, no DVT  ? ?Past Surgical History:  ?Procedure Laterality Date  ? CHOLECYSTECTOMY  04/2011  ? biliary stent placement  ? CLOSED REDUCTION FINGER WITH PERCUTANEOUS PINNING Left 10/23/2021  ? Procedure: LEFT INDEX, MIDDLE, RING, AND SMALL FINGER CLOSED REDUCTION FINGER WITH PERCUTANEOUS PINNING;  Surgeon: Sherilyn Cooter, MD;  Location: [facility];  Service: Orthopedics;  Laterality: Left;  ? COLONOSCOPY    ? [place] REPAIR  2015  ? MOHS SURGERY Right 02/2017  ? cheek and eyelid  ? Josph Macho Touch    ? Vaginal procedure  ? POLYPECTOMY    ? TUBAL LIGATION    ? ?Patient Active Problem List  ? Diagnosis Date Noted  ? Multiple closed fractures of proximal phalanx of finger with routine healing 10/08/2021  ? EtOH dependence ([place]) 09/11/2021  ? Vitamin B 12 deficiency 09/11/2021  ? Post-traumatic headache 09/11/2021  ? Insomnia 09/11/2021  ? Post concussion syndrome 08/30/2021  ? Benign essential HTN   ? Hyperglycemia   ? Urinary incontinence   ? Pelvic pain   ? Closed pelvic fracture ([place]) 08/03/2020  ? Closed fracture of left superior pubic ramus ([place]) 07/30/2020  ? Left hip pain 07/30/2020  ? GAD (generalized anxiety disorder) 07/30/2020  ? DJD (degenerative joint disease) 09/20/2019  ? Familial hyperlipidemia 07/01/2018  ? Right shoulder pain 07/14/2017  ? Malignant melanoma of skin of canthus of right eye ([place]) 03/16/2017  ? PCP NOTES >>>>>>>>>>>>>>> 05/22/2015  ? High coronary artery calcium score 02/14/2015  ? Hernia, abdominal 04/01/2013  ?  Depression with anxiety 04/01/2012  ? Malignant neoplasm of upper lobe, bronchus or lung 09/30/2011  ? H/O: lung cancer 09/25/2011  ? Annual physical exam 09/05/2011  ? IBS -diarrhea predominant 09/05/2011  ? COPD (chronic obstructive pulmonary disease) ([place]) 09/05/2011  ? Osteoporosis 03/01/2010  ? TOBACCO ABUSE 11/14/2008  ? Dyslipidemia 05/18/2007  ? Essential hypertension 05/18/2007  ? ?ONSET DATE: 10/23/21 DOS, 10/03/21 DOI ?  ?REFERRING DIAG: S62.619D (ICD-10-CM) - Multiple closed fractures of proximal phalanx of finger with routine healing ? ?THERAPY DIAG:  ?Pain in left hand ? ?Stiffness of left hand, not elsewhere classified ? ?Muscle weakness (generalized) ? ?Localized edema ? ? ?PERTINENT HISTORY: Per MD: "Status post screw fixation of left index, middle,  ring, small fingers.  Early mobilization per [place] protocol for screws/plates." ? ?PRECAUTIONS: 3 weeks out from sx now (A/PROM, blocking, dynamics ok) ? ?SUBJECTIVE:  ?She states not understanding how to put on training brace, only doing HEP a few times in a few days. She asks  ? ?She arrives with sister again, states orthotic is fitting well, no pressure (but it still needs adjusted to get MCP Js more bent and IP Js straighter). She states HEP is "hurting" but then describes "pulling" and "stiffness" and no sharp pain.  Sister states that the pt has been confused and not doing HEP at least 5 x day as asked.  ? ?PAIN:  ?Are you having pain? Yes, 5/10 soreness coming in today ? ? ?OBJECTIVE: (All objective assessments below are from initial evaluation on: 11/05/21 unless otherwise specified.)  ? ? ?HAND DOMINANCE: Right ?  ?ADLs: ?Overall ADLs: She states the things that bothers her the most are opening containers, purse, carrying things, etc.  ?  ?FUNCTIONAL OUTCOME MEASURES: ?Quick Dash: 70% impairment ?  ?UE ROM   2.8cm gap from pad thumb to pad SF, cannot make fist, can abd/add slightly (~10*). MF is most sore/painful and was taken for example of all stiff fingers. TAM in Lt MF is 29* today.  ?  ?A/ROM Left ?11/05/21  ?Thumb MCP (0-60)    ?Thumb IP (0-80)    ?Thumb Radial abd/add (0-55)    ?Thumb Palmar abd/add (0-45)    ?Thumb opposition to small finger    ?Index MCP (0-90)    ?Index PIP (0-100)    ?Index DIP (0-70)    ?Long MCP (0-90)  (+10)- 0  ?Long PIP (0-100)  (-39) - 54  ?Long DIP (0-70)  (+5) - 0  ?Ring MCP (0-90)    ?Ring PIP (0-100)    ?Ring DIP (0-70)    ?Little MCP (0-90)    ?Little PIP (0-100)    ?Little DIP (0-70)    ?(Blank rows = not tested)  ?  ?Active ROM Right ?11/05/2021 Left ?11/05/2021  ?Wrist flexion 65 55  ?Wrist extension 55 15  ?Wrist ulnar deviation      ?Wrist radial deviation      ?Wrist pronation      ?Wrist supination      ?(Blank rows = not tested) ?  ?  ?UE MMT:   NT at eval but at  least 3-/5 MMT  ?  ?  ?HAND FUNCTION: ?Grip strength: Right: TBD lbs; Left: TBD lbs ?  ?COORDINATION: ?9 Hole Peg test: Right: TBD sec; Left: TBD sec ?Box and Blocks:  Right TBDblocks, Left TBDblocks ?Pt with overt stiffness and no functional use of Lt hand, can't make a fist, etc. Impaired gross and FMS today.  ?  ?SENSATION: ?  Intact but decreased LT to all fingers, but states feels some paresthesia  ?  ?EDEMA: diffuse through fingers, hand, wrist lt side  ?  ?COGNITION: ?Overall cognitive status: Difficulty to assess and she was somewhat tangential, apparent memory issues and somewhat impulsive. She made some strange comments today as well. ?Areas of impairment: Memory: Deficits mild ?Awareness: Deficits some lack of safety awareness ?Problem solving: Deficits needs cues and assist ?Behavior: Within functional limits and Impulsive ?  ?OBSERVATIONS: At one point she absent mindedly pushed against her seat with left hand to shift weight, felt no pain, states "that's cool," and attempts again. OT recommends no weight bearing at this time ?  ?  ?TODAY'S TREATMENT:  ?11/14/21: A/ROM in fluidotherapy for 6 mins for increasing mobility. Manual therapy gentle joint mobs to DIP Js and gentle stretches to wrist in flex, ext; thumb in flex; DIP Js blocking at MCP/PIP.  She tolerates those stretches with only mild pain and feels "medium" stretch. OT also does AA/ROM with pt for full composite finger flexion and ext at digits 2-5. She then get help, re-education donning training brace and performing 10x resisted extension with MP Js blocked in ~20* flex to help correct/reduce PIP J flexion contractures. She needs a lot of education and time to understand due to overt poor memory due to encephalopathy cognitive issues. OT also adjust orthotic to add padding at tips of fingers for pt comfort and to increase tension for straightening PIP Js when strapped in.  ? ?11/11/21: Due to pt having more flexible MCP Js today and also the  start of what seems to be PIP J stiffness, OT adjusts SAFE orthotic to have more MCP flexion and to apply pressure along PIP Js to limit allowable flexion at PIPs. She states this fits well and does verba

## 2021-11-18 ENCOUNTER — Ambulatory Visit (INDEPENDENT_AMBULATORY_CARE_PROVIDER_SITE_OTHER): Payer: Medicare Other | Admitting: Rehabilitative and Restorative Service Providers"

## 2021-11-18 ENCOUNTER — Other Ambulatory Visit: Payer: Self-pay

## 2021-11-18 DIAGNOSIS — M6281 Muscle weakness (generalized): Secondary | ICD-10-CM | POA: Diagnosis not present

## 2021-11-18 DIAGNOSIS — R600 Localized edema: Secondary | ICD-10-CM | POA: Diagnosis not present

## 2021-11-18 DIAGNOSIS — M25642 Stiffness of left hand, not elsewhere classified: Secondary | ICD-10-CM

## 2021-11-18 DIAGNOSIS — M79642 Pain in left hand: Secondary | ICD-10-CM | POA: Diagnosis not present

## 2021-11-18 NOTE — Therapy (Signed)
?OUTPATIENT OCCUPATIONAL THERAPY TREATMENT NOTE ? ? ?Patient Name: [name] [name] ?MRN: [id number] ?DOB:[birth year], 71 y.o., female ?Today's Date: 11/18/2021 ? ?PCP: Colon Branch, MD ?REFERRING PROVIDER: Sherilyn Cooter, MD ? ? OT End of Session - 11/18/21 1527   ? ? Visit Number 4   ? Number of Visits 14   ? Date for OT Re-Evaluation 12/27/21   ? Authorization Type Medicare   ? Progress Note Due on Visit 10   ? OT Start Time 9357   ? OT Stop Time 0177   ? OT Time Calculation (min) 40 min   ? Equipment Utilized During Treatment orthotic materials   ? Activity Tolerance Patient tolerated treatment well;No increased pain;Patient limited by pain   ? ?  ?  ? ?  ? ? ? ? ?Past Medical History:  ?Diagnosis Date  ? Anemia   ? duering cancer treatment   ? Anxiety   ? Arthritis   ? OA hands   ? Bilateral lower extremity pain   ? Blood transfusion without reported diagnosis   ? during chemo for lung cancer  ? Cataract   ? forming  but very small   ? Cholelithiasis   ? Colon polyps   ? adenomatous  ? Concussion 08/25/2021  ? ETOHdependence [facility])   ? High cholesterol   ? History of chemotherapy   ? for lung cancer- completed 2013  ? Hx of radiation therapy 10/07/11 to 11/20/11  ? right lung  ? Hx of radiation therapy 01/07/2012- 01/21/12  ? cranial irradiation  ? Hypertension   ? Lung cancer ([place]) 09/19/2011  ? Stage III currently in remission  ? Malignant melanoma of skin of canthus of right eye ([place]) 02/2017  ? Osteoporosis 04/2014  ? Dexa scan by Dr. Corinna Capra, T-score -2.7, next in 2 years, with at least 13 % major fracture in 10 years  ? Pitting edema   ? Bilateral lower extremities, duplex ultrasound 04/03/2015 normal, no DVT  ? ?Past Surgical History:  ?Procedure Laterality Date  ? CHOLECYSTECTOMY  04/2011  ? biliary stent placement  ? CLOSED REDUCTION FINGER WITH PERCUTANEOUS PINNING Left 10/23/2021  ? Procedure: LEFT INDEX, MIDDLE, RING, AND SMALL FINGER CLOSED REDUCTION FINGER WITH PERCUTANEOUS PINNING;  Surgeon: Sherilyn Cooter, MD;  Location: [facility];  Service: Orthopedics;  Laterality: Left;  ? COLONOSCOPY    ? [place] REPAIR  2015  ? MOHS SURGERY Right 02/2017  ? cheek and eyelid  ? Josph Macho Touch    ? Vaginal procedure  ? POLYPECTOMY    ? TUBAL LIGATION    ? ?Patient Active Problem List  ? Diagnosis Date Noted  ? Multiple closed fractures of proximal phalanx of finger with routine healing 10/08/2021  ? EtOH dependence ([place]) 09/11/2021  ? Vitamin B 12 deficiency 09/11/2021  ? Post-traumatic headache 09/11/2021  ? Insomnia 09/11/2021  ? Post concussion syndrome 08/30/2021  ? Benign essential HTN   ? Hyperglycemia   ? Urinary incontinence   ? Pelvic pain   ? Closed pelvic fracture ([place]) 08/03/2020  ? Closed fracture of left superior pubic ramus ([place]) 07/30/2020  ? Left hip pain 07/30/2020  ? GAD (generalized anxiety disorder) 07/30/2020  ? DJD (degenerative joint disease) 09/20/2019  ? Familial hyperlipidemia 07/01/2018  ? Right shoulder pain 07/14/2017  ? Malignant melanoma of skin of canthus of right eye ([place]) 03/16/2017  ? PCP NOTES >>>>>>>>>>>>>>> 05/22/2015  ? High coronary artery calcium score 02/14/2015  ? Hernia, abdominal 04/01/2013  ?  Depression with anxiety 04/01/2012  ? Malignant neoplasm of upper lobe, bronchus or lung 09/30/2011  ? H/O: lung cancer 09/25/2011  ? Annual physical exam 09/05/2011  ? IBS -diarrhea predominant 09/05/2011  ? COPD (chronic obstructive pulmonary disease) ([place]) 09/05/2011  ? Osteoporosis 03/01/2010  ? TOBACCO ABUSE 11/14/2008  ? Dyslipidemia 05/18/2007  ? Essential hypertension 05/18/2007  ? ?ONSET DATE: 10/23/21 DOS, 10/03/21 DOI ?  ?REFERRING DIAG: S62.619D (ICD-10-CM) - Multiple closed fractures of proximal phalanx of finger with routine healing ? ?THERAPY DIAG:  ?Pain in left hand ? ?Stiffness of left hand, not elsewhere classified ? ?Muscle weakness (generalized) ? ?Localized edema ? ? ?PERTINENT HISTORY: Per MD: "Status post screw fixation of left index, middle,  ring, small fingers.  Early mobilization per [place] protocol for screws/plates." ? ?PRECAUTIONS: ~4 weeks out from sx now (A/PROM, blocking, dynamics ok) ? ?SUBJECTIVE:  ?11/18/21: She states having sharp pain in night, admits to not wearing brace and sleeping in external rotation with her hand behind her head. She is cautioned to not do that, wear brace at night, etc.  ? ?11/14/21: She states not understanding how to put on training brace, only doing HEP a few times in a few days.  ? ?11/11/21: She arrives with sister again, states orthotic is fitting well, no pressure (but it still needs adjusted to get MCP Js more bent and IP Js straighter). She states HEP is "hurting" but then describes "pulling" and "stiffness" and no sharp pain.  Sister states that the pt has been confused and not doing HEP at least 5 x day as asked.  ? ?PAIN:  ?Are you having pain? Yes, 5/10 soreness coming in today ? ? ?OBJECTIVE: (All objective assessments below are from initial evaluation on: 11/05/21 unless otherwise specified.)  ? ? ?HAND DOMINANCE: Right ?  ?ADLs: ?Overall ADLs: She states the things that bothers her the most are opening containers, purse, carrying things, etc.  ?  ?FUNCTIONAL OUTCOME MEASURES: ?Quick Dash: 70% impairment ?  ?UE ROM   2.8cm gap from pad thumb to pad SF, cannot make fist, can abd/add slightly (~10*). MF is most sore/painful and was taken for example of all stiff fingers. TAM in Lt MF is 29* today.  ?  ?A/ROM Left ?11/05/21  ?Thumb MCP (0-60)    ?Thumb IP (0-80)    ?Thumb Radial abd/add (0-55)    ?Thumb Palmar abd/add (0-45)    ?Thumb opposition to small finger    ?Index MCP (0-90)    ?Index PIP (0-100)    ?Index DIP (0-70)    ?Long MCP (0-90)  (+10)- 0  ?Long PIP (0-100)  (-39) - 54  ?Long DIP (0-70)  (+5) - 0  ?Ring MCP (0-90)    ?Ring PIP (0-100)    ?Ring DIP (0-70)    ?Little MCP (0-90)    ?Little PIP (0-100)    ?Little DIP (0-70)    ?(Blank rows = not tested)  ?  ?Active ROM Right ?11/05/2021  Left ?11/05/2021  ?Wrist flexion 65 55  ?Wrist extension 55 15  ?Wrist ulnar deviation      ?Wrist radial deviation      ?Wrist pronation      ?Wrist supination      ?(Blank rows = not tested) ?  ?  ?UE MMT:   NT at eval but at least 3-/5 MMT  ?  ?  ?HAND FUNCTION: ?Grip strength: Right: TBD lbs; Left: TBD lbs ?  ?COORDINATION: ?9 Hole Peg test: Right:  TBD sec; Left: TBD sec ?Box and Blocks:  Right TBDblocks, Left TBDblocks ?Pt with overt stiffness and no functional use of Lt hand, can't make a fist, etc. Impaired gross and FMS today.  ?  ?SENSATION: ?Intact but decreased LT to all fingers, but states feels some paresthesia  ?  ?EDEMA: diffuse through fingers, hand, wrist lt side  ?  ?COGNITION: ?Overall cognitive status: Difficulty to assess and she was somewhat tangential, apparent memory issues and somewhat impulsive. She made some strange comments today as well. ?Areas of impairment: Memory: Deficits mild ?Awareness: Deficits some lack of safety awareness ?Problem solving: Deficits needs cues and assist ?Behavior: Within functional limits and Impulsive ?  ?OBSERVATIONS: 11/18/21: pt overtly confused, easy to agitate at times, states strange, abstract things at times.   ?  ?  ?TODAY'S TREATMENT:  ?11/18/21: She is overtly confused and has serious sort -term memory issues.  She immediately cannot don brace after taking off and needs cues again to do correctly (after <10 seconds going by). OT reviews each basic exericse slowly, specifically with many cues and repetitions. She still has some difficulties and needs more practice. At end of session she states being tired and glad to go home and rest, and she seemed shocked when OT states she will need to exercise at least 3-4 more time today. It appears obvious that she is almost totally non-compliant with her home exercises and orthotic wear.   ? ?11/14/21: A/ROM in fluidotherapy for 6 mins for increasing mobility. Manual therapy gentle joint mobs to DIP Js and gentle  stretches to wrist in flex, ext; thumb in flex; DIP Js blocking at MCP/PIP.  She tolerates those stretches with only mild pain and feels "medium" stretch. OT also does AA/ROM with pt for full composite finger flexi

## 2021-11-20 ENCOUNTER — Telehealth: Payer: Self-pay | Admitting: Internal Medicine

## 2021-11-20 ENCOUNTER — Ambulatory Visit (INDEPENDENT_AMBULATORY_CARE_PROVIDER_SITE_OTHER): Payer: Medicare Other | Admitting: Rehabilitative and Restorative Service Providers"

## 2021-11-20 DIAGNOSIS — M6281 Muscle weakness (generalized): Secondary | ICD-10-CM

## 2021-11-20 DIAGNOSIS — M25642 Stiffness of left hand, not elsewhere classified: Secondary | ICD-10-CM | POA: Diagnosis not present

## 2021-11-20 DIAGNOSIS — R600 Localized edema: Secondary | ICD-10-CM | POA: Diagnosis not present

## 2021-11-20 DIAGNOSIS — M79642 Pain in left hand: Secondary | ICD-10-CM | POA: Diagnosis not present

## 2021-11-20 NOTE — Therapy (Signed)
?OUTPATIENT OCCUPATIONAL THERAPY TREATMENT NOTE ? ? ?Patient Name: [name] [name] ?MRN: [id number] ?DOB:02-20-1951, 71 y.o., female ?Today's Date: 11/20/2021 ? ?PCP: Colon Branch, MD ?REFERRING PROVIDER: Sherilyn Cooter, MD ? ? OT End of Session - 11/20/21 1421   ? ? Visit Number 5   ? Number of Visits 14   ? Date for OT Re-Evaluation 12/27/21   ? Authorization Type Medicare   ? Progress Note Due on Visit 10   ? OT Start Time 1422   ? OT Stop Time 1515   ? OT Time Calculation (min) 53 min   ? Equipment Utilized During Treatment --   ? Activity Tolerance Patient tolerated treatment well;No increased pain;Patient limited by pain   ? Behavior During Therapy [facility] for tasks assessed/performed   behaviors better today, less apparent memory issues, but still noted when asking for teach-back  ? ?  ?  ? ?  ? ? ? ? ? ?Past Medical History:  ?Diagnosis Date  ? Anemia   ? duering cancer treatment   ? Anxiety   ? Arthritis   ? OA hands   ? Bilateral lower extremity pain   ? Blood transfusion without reported diagnosis   ? during chemo for lung cancer  ? Cataract   ? forming  but very small   ? Cholelithiasis   ? Colon polyps   ? adenomatous  ? Concussion 08/25/2021  ? ETOHdependence [facility])   ? High cholesterol   ? History of chemotherapy   ? for lung cancer- completed 2013  ? Hx of radiation therapy 10/07/11 to 11/20/11  ? right lung  ? Hx of radiation therapy 01/07/2012- 01/21/12  ? cranial irradiation  ? Hypertension   ? Lung cancer ([place]) 09/19/2011  ? Stage III currently in remission  ? Malignant melanoma of skin of canthus of right eye ([place]) 02/2017  ? Osteoporosis 04/2014  ? Dexa scan by Dr. Corinna Capra, T-score -2.7, next in 2 years, with at least 13 % major fracture in 10 years  ? Pitting edema   ? Bilateral lower extremities, duplex ultrasound 04/03/2015 normal, no DVT  ? ?Past Surgical History:  ?Procedure Laterality Date  ? CHOLECYSTECTOMY  04/2011  ? biliary stent placement  ? CLOSED REDUCTION FINGER WITH PERCUTANEOUS PINNING  Left 10/23/2021  ? Procedure: LEFT INDEX, MIDDLE, RING, AND SMALL FINGER CLOSED REDUCTION FINGER WITH PERCUTANEOUS PINNING;  Surgeon: Sherilyn Cooter, MD;  Location: [place];  Service: Orthopedics;  Laterality: Left;  ? COLONOSCOPY    ? [place] REPAIR  2015  ? MOHS SURGERY Right 02/2017  ? cheek and eyelid  ? Josph Macho Touch    ? Vaginal procedure  ? POLYPECTOMY    ? TUBAL LIGATION    ? ?Patient Active Problem List  ? Diagnosis Date Noted  ? Multiple closed fractures of proximal phalanx of finger with routine healing 10/08/2021  ? EtOH dependence ([place]) 09/11/2021  ? Vitamin B 12 deficiency 09/11/2021  ? Post-traumatic headache 09/11/2021  ? Insomnia 09/11/2021  ? Post concussion syndrome 08/30/2021  ? Benign essential HTN   ? Hyperglycemia   ? Urinary incontinence   ? Pelvic pain   ? Closed pelvic fracture ([place]) 08/03/2020  ? Closed fracture of left superior pubic ramus ([place]) 07/30/2020  ? Left hip pain 07/30/2020  ? GAD (generalized anxiety disorder) 07/30/2020  ? DJD (degenerative joint disease) 09/20/2019  ? Familial hyperlipidemia 07/01/2018  ? Right shoulder pain 07/14/2017  ? Malignant melanoma of skin of  canthus of right eye ([place]) 03/16/2017  ? PCP NOTES >>>>>>>>>>>>>>> 05/22/2015  ? High coronary artery calcium score 02/14/2015  ? Hernia, abdominal 04/01/2013  ? Depression with anxiety 04/01/2012  ? Malignant neoplasm of upper lobe, bronchus or lung 09/30/2011  ? H/O: lung cancer 09/25/2011  ? Annual physical exam 09/05/2011  ? IBS -diarrhea predominant 09/05/2011  ? COPD (chronic obstructive pulmonary disease) ([place]) 09/05/2011  ? Osteoporosis 03/01/2010  ? TOBACCO ABUSE 11/14/2008  ? Dyslipidemia 05/18/2007  ? Essential hypertension 05/18/2007  ? ?ONSET DATE: 10/23/21 DOS, 10/03/21 DOI ?  ?REFERRING DIAG: S62.619D (ICD-10-CM) - Multiple closed fractures of proximal phalanx of finger with routine healing ? ?THERAPY DIAG:  ?Pain in left hand ? ?Stiffness of left hand, not elsewhere  classified ? ?Muscle weakness (generalized) ? ?Localized edema ? ? ?PERTINENT HISTORY: Per MD: "Status post screw fixation of left index, middle, ring, small fingers.  Early mobilization per [place] protocol for screws/plates." ? ?PRECAUTIONS: 4 weeks out from sx now (A/PROM, blocking, dynamics ok) ? ?SUBJECTIVE:  ?11/20/21: She arrives with sister, per usual and states doing light things without brace on and getting some motion through hand.  ? ?11/18/21: She states having sharp pain in night, admits to not wearing brace and sleeping in external rotation with her hand behind her head. She is cautioned to not do that, wear brace at night, etc.  ? ?11/14/21: She states not understanding how to put on training brace, only doing HEP a few times in a few days.  ? ? ?PAIN:  ?Are you having pain? Yes, 5/10 soreness coming in today ? ? ?OBJECTIVE: (All objective assessments below are from initial evaluation on: 11/05/21 unless otherwise specified.)  ? ? ?HAND DOMINANCE: Right ?  ?ADLs: ?Overall ADLs: She states the things that bothers her the most are opening containers, purse, carrying things, etc.  ?  ?FUNCTIONAL OUTCOME MEASURES: ?Quick Dash: 70% impairment ?  ?UE ROM   2.8cm gap from pad thumb to pad SF, cannot make fist, can abd/add slightly (~10*). MF is most sore/painful and was taken for example of all stiff fingers. TAM in Lt MF is 29* today.  ?  ?A/ROM Left ?11/05/21  ?Thumb MCP (0-60)    ?Thumb IP (0-80)    ?Thumb Radial abd/add (0-55)    ?Thumb Palmar abd/add (0-45)    ?Thumb opposition to small finger    ?Index MCP (0-90)    ?Index PIP (0-100)    ?Index DIP (0-70)    ?Long MCP (0-90)  (+10)- 0  ?Long PIP (0-100)  (-39) - 54  ?Long DIP (0-70)  (+5) - 0  ?Ring MCP (0-90)    ?Ring PIP (0-100)    ?Ring DIP (0-70)    ?Little MCP (0-90)    ?Little PIP (0-100)    ?Little DIP (0-70)    ?(Blank rows = not tested)  ?  ?Active ROM Right ?11/05/2021 Left ?11/05/2021  ?Wrist flexion 65 55  ?Wrist extension 55 15  ?Wrist ulnar  deviation      ?Wrist radial deviation      ?Wrist pronation      ?Wrist supination      ?(Blank rows = not tested) ?  ?  ?UE MMT:   NT at eval but at least 3-/5 MMT  ?  ?  ?HAND FUNCTION: ?Grip strength: Right: TBD lbs; Left: TBD lbs ?  ?COORDINATION: ?9 Hole Peg test: Right: TBD sec; Left: TBD sec ?Box and Blocks: Left TBD ?Pt with overt stiffness  and no functional use of Lt hand, can't make a fist, etc. Impaired gross and FMS today.  ?  ?SENSATION: ?Intact but decreased LT to all fingers, but states feels some paresthesia  ?  ?EDEMA: diffuse through fingers, hand, wrist lt side  ?  ?COGNITION: ?Overall cognitive status: Difficulty to assess and she was somewhat tangential, apparent memory issues and somewhat impulsive. She made some strange comments today as well. ?Areas of impairment: Memory: Deficits moderate to severe ?Awareness: Deficits moderate lack of safety awareness ?Problem solving: Deficits needs substantial cues and assist ?Behavior: Impulsive & easily flustered when confused ?  ?OBSERVATIONS: 11/18/21: pt overtly confused, easy to agitate at times, states strange, abstract things at times.   ?  ?  ?TODAY'S TREATMENT:  ?11/20/21: OT trims down orthotic to hand based IP ext orthotic today to allow wrist motion. She and sister are edu on wearing schedule (ok to remove 3-4 x day to rest, but otherwise wear at night and during the day. Her PIP J's are still tight in flexion but perhaps better- will get measures next week. OT also reviews full HEP with her, she is given updated handouts with pictures of HEP today including new wrist stretches and finger P/ROM/stretches as tolerated and she spends the rest of the session going through these slowly, carefully with OT and her sister as she continues to show poor memory and overall cognition, though not as bad tis session as previously.  ? ?Exercises ?- Seated Forearm Pronation and Supination AROM  - 4-6 x daily - 1 sets - 10-15 reps ?- Wrist AROM Flexion  Extension  - 4-6 x daily - 10-15 reps ?- Seated Wrist Flexion Stretch  - 4-6 x daily - 1 sets - 10-15 reps ?- Wrist Extension Stretch Pronated  - 3-4 x daily - 3-5 reps - 15 hold ?- Seated Finger MP Extension AR

## 2021-11-20 NOTE — Telephone Encounter (Signed)
LMOM asking for call back.  

## 2021-11-20 NOTE — Telephone Encounter (Signed)
Received a note from [place], [place]. ?I appreciate the information (see below). ?Please call patient, see if she is willing to come back sooner than December 16, 2021. ?====== ? ?Just wanted to let you know that I have some safety concerns for [name] [name].  Dr. Tempie Donning referred her to me for finger fractures, and in numerous treatments I have noticed that her memory (short-term, working, etc.) and problem solving skills seem moderately-severely impaired.  She has a history of encephalopathy, and her sister tells me that she thinks [name] [name] is still drinking some nights. Apparently she lives alone. Her sister drives her to therapy. I think she may not be safe at home alone, likely should not drive, and that she would benefit from psych/dementia testing. I will gently bring this all up with her sister, but she seems aware and is also concerned.  ?Just a heads-up for what I've been seeing.  ? ?

## 2021-11-25 ENCOUNTER — Ambulatory Visit (INDEPENDENT_AMBULATORY_CARE_PROVIDER_SITE_OTHER): Payer: Medicare Other | Admitting: Rehabilitative and Restorative Service Providers"

## 2021-11-25 DIAGNOSIS — R600 Localized edema: Secondary | ICD-10-CM

## 2021-11-25 DIAGNOSIS — M6281 Muscle weakness (generalized): Secondary | ICD-10-CM | POA: Diagnosis not present

## 2021-11-25 DIAGNOSIS — M25642 Stiffness of left hand, not elsewhere classified: Secondary | ICD-10-CM | POA: Diagnosis not present

## 2021-11-25 DIAGNOSIS — M79642 Pain in left hand: Secondary | ICD-10-CM

## 2021-11-25 NOTE — Therapy (Signed)
?OUTPATIENT OCCUPATIONAL THERAPY TREATMENT NOTE ? ? ?Patient Name: [name] [name] ?MRN: [id number] ?DOB:[birth year], 71 y.o., female ?Today's Date: 11/25/2021 ? ?PCP: Colon Branch, MD ?REFERRING PROVIDER: Sherilyn Cooter, MD ? ? OT End of Session - 11/25/21 1517   ? ? Visit Number 6   ? Number of Visits 14   ? Date for OT Re-Evaluation 12/27/21   ? Authorization Type Medicare   ? Progress Note Due on Visit 10   ? OT Start Time 6387   ? OT Stop Time 5643   ? OT Time Calculation (min) 45 min   ? Activity Tolerance Patient tolerated treatment well;No increased pain;Patient limited by pain   ? Behavior During Therapy [facility] for tasks assessed/performed   behaviors better today, less apparent memory issues, but still noted when asking for teach-back  ? ?  ?  ? ?  ? ? ? ? ? ? ?Past Medical History:  ?Diagnosis Date  ? Anemia   ? duering cancer treatment   ? Anxiety   ? Arthritis   ? OA hands   ? Bilateral lower extremity pain   ? Blood transfusion without reported diagnosis   ? during chemo for lung cancer  ? Cataract   ? forming  but very small   ? Cholelithiasis   ? Colon polyps   ? adenomatous  ? Concussion 08/25/2021  ? [facility])   ? High cholesterol   ? History of chemotherapy   ? for lung cancer- completed 2013  ? Hx of radiation therapy 10/07/11 to 11/20/11  ? right lung  ? Hx of radiation therapy 01/07/2012- 01/21/12  ? cranial irradiation  ? Hypertension   ? Lung cancer ([place]) 09/19/2011  ? Stage III currently in remission  ? Malignant melanoma of skin of canthus of right eye ([place]) 02/2017  ? Osteoporosis 04/2014  ? Dexa scan by Dr. Corinna Capra, T-score -2.7, next in 2 years, with at least 13 % major fracture in 10 years  ? Pitting edema   ? Bilateral lower extremities, duplex ultrasound 04/03/2015 normal, no DVT  ? ?Past Surgical History:  ?Procedure Laterality Date  ? CHOLECYSTECTOMY  04/2011  ? biliary stent placement  ? CLOSED REDUCTION FINGER WITH PERCUTANEOUS PINNING Left 10/23/2021  ? Procedure: LEFT INDEX,  MIDDLE, RING, AND SMALL FINGER CLOSED REDUCTION FINGER WITH PERCUTANEOUS PINNING;  Surgeon: Sherilyn Cooter, MD;  Location: [place];  Service: Orthopedics;  Laterality: Left;  ? COLONOSCOPY    ? [place] REPAIR  2015  ? MOHS SURGERY Right 02/2017  ? cheek and eyelid  ? Josph Macho Touch    ? Vaginal procedure  ? POLYPECTOMY    ? TUBAL LIGATION    ? ?Patient Active Problem List  ? Diagnosis Date Noted  ? Multiple closed fractures of proximal phalanx of finger with routine healing 10/08/2021  ? EtOH dependence ([place]) 09/11/2021  ? Vitamin B 12 deficiency 09/11/2021  ? Post-traumatic headache 09/11/2021  ? Insomnia 09/11/2021  ? Post concussion syndrome 08/30/2021  ? Benign essential HTN   ? Hyperglycemia   ? Urinary incontinence   ? Pelvic pain   ? Closed pelvic fracture ([place]) 08/03/2020  ? Closed fracture of left superior pubic ramus ([place]) 07/30/2020  ? Left hip pain 07/30/2020  ? GAD (generalized anxiety disorder) 07/30/2020  ? DJD (degenerative joint disease) 09/20/2019  ? Familial hyperlipidemia 07/01/2018  ? Right shoulder pain 07/14/2017  ? Malignant melanoma of skin of canthus of right eye ([place]) 03/16/2017  ?  PCP NOTES >>>>>>>>>>>>>>> 05/22/2015  ? High coronary artery calcium score 02/14/2015  ? Hernia, abdominal 04/01/2013  ? Depression with anxiety 04/01/2012  ? Malignant neoplasm of upper lobe, bronchus or lung 09/30/2011  ? H/O: lung cancer 09/25/2011  ? Annual physical exam 09/05/2011  ? IBS -diarrhea predominant 09/05/2011  ? COPD (chronic obstructive pulmonary disease) ([place]) 09/05/2011  ? Osteoporosis 03/01/2010  ? TOBACCO ABUSE 11/14/2008  ? Dyslipidemia 05/18/2007  ? Essential hypertension 05/18/2007  ? ?ONSET DATE: 10/23/21 DOS, 10/03/21 DOI ?  ?REFERRING DIAG: S62.619D (ICD-10-CM) - Multiple closed fractures of proximal phalanx of finger with routine healing ? ?THERAPY DIAG:  ?Stiffness of left hand, not elsewhere classified ? ?Pain in left hand ? ?Muscle weakness  (generalized) ? ?Localized edema ? ? ?PERTINENT HISTORY: Per MD: "Status post screw fixation of left index, middle, ring, small fingers.  Early mobilization per [place] protocol for screws/plates." ? ?PRECAUTIONS: ~5 weeks out from sx now (A/PROM, blocking, dynamics ok) ? ?SUBJECTIVE:  ?11/25/21: She states pain now is only mainly in SF and RF joints now, and she states not wearing braces still at home, tells stories about petting dog at night with no braces, etc. She has been largely non-compliant with bracing and HEP. She seems more cognitively aware today, though still some confusion at times. She is likely at an [place] Level 4.  ? ?11/20/21: She arrives with sister, per usual and states doing light things without brace on and getting some motion through hand.  ? ?11/18/21: She states having sharp pain in night, admits to not wearing brace and sleeping in external rotation with her hand behind her head. She is cautioned to not do that, wear brace at night, etc.  ? ?11/14/21: She states not understanding how to put on training brace, only doing HEP a few times in a few days.  ? ? ?PAIN:  ?Are you having pain? Yes, 3/10 soreness coming in today ? ? ?OBJECTIVE: (All objective assessments below are from initial evaluation on: 11/05/21 unless otherwise specified.)  ? ? ?HAND DOMINANCE: Right ?  ?ADLs: ?Overall ADLs: She states the things that bothers her the most are opening containers, purse, carrying things, etc.  ?  ?FUNCTIONAL OUTCOME MEASURES: ?Quick Dash: 70% impairment ?  ?UE ROM   2.8cm gap from pad thumb to pad SF, cannot make fist, can abd/add slightly (~10*). MF is most sore/painful and was taken for example of all stiff fingers. TAM in Lt MF is 29* today.  ? ?11/25/21: 0cm gap from pad thumb to SF now; 6.4cm gap from MF to full fist today. MF still most stiff ?  ?A/ROM Left ?11/05/21 Left  ?11/25/21  ?Thumb MCP (0-60)     ?Thumb IP (0-80)     ?Thumb Radial abd/add (0-55)     ?Thumb Palmar abd/add (0-45)      ?Thumb opposition to small finger     ?Index MCP (0-90)     ?Index PIP (0-100)     ?Index DIP (0-70)     ?Long MCP (0-90)  (+10)- 0  (+5) -35  ?Long PIP (0-100)  (-39) - 54  (-40) -60  ?Long DIP (0-70)  (+5) - 0  (+5) -16  ?Ring MCP (0-90)     ?Ring PIP (0-100)     ?Ring DIP (0-70)     ?Little MCP (0-90)     ?Little PIP (0-100)     ?Little DIP (0-70)     ?(Blank rows = not tested)  ?  ?  Active ROM Right ?11/05/2021 Left ?11/05/2021 Left ?11/25/21  ?Wrist flexion 65 55 67  ?Wrist extension 55 15 35  ?(Blank rows = not tested) ?  ?  ?UE MMT:   NT at eval but at least 3-/5 MMT  ?  ?  ?HAND FUNCTION: ?Grip strength: Right: TBD lbs; Left: TBD lbs ?  ?COORDINATION: ?3 Hole Peg test: Right: TBD sec; Left: TBD sec ?11/25/21: Box and Blocks: Left 29 11/25/21 (WFL is 61)  ?  ?SENSATION: ?Intact but decreased LT to all fingers, but states feels some paresthesia  ?  ?EDEMA: diffuse through fingers, hand, wrist lt side  ?  ?COGNITION: ?Overall cognitive status: Difficulty to assess and she was somewhat tangential, apparent memory issues and somewhat impulsive. She made some strange comments today as well. ?Areas of impairment: Memory: Deficits moderate to severe ?Awareness: Deficits moderate lack of safety awareness ?Problem solving: Deficits needs substantial cues and assist ?Behavior: Impulsive & easily flustered when confused ?  ?OBSERVATIONS: 11/18/21: pt overtly confused, easy to agitate at times, states strange, abstract things at times.   ?  ?  ?TODAY'S TREATMENT:  ?11/25/21: OT re-educates on bracing wear, and is now adapting further to slip hand in and out of back strap as she states not strong enough/can't figure out/remove strap. She then performs AROM inside fluidotherapy in composite fist flex/ext for 5 mins. She performs A/ROM for new measures today showing some increased wrist and finger motion (nothing "worse" today, though PIP J extension has not improved, either). She is re-educated that the new orthotic should be worn at  night and when not exercising to help with PIP J flexion contractures (and she hasn't been wearing, per her admittance). She also performs fnl activities of box and blocks (scoring impaired levels now), and attempt

## 2021-11-27 ENCOUNTER — Ambulatory Visit (INDEPENDENT_AMBULATORY_CARE_PROVIDER_SITE_OTHER): Payer: Medicare Other | Admitting: Rehabilitative and Restorative Service Providers"

## 2021-11-27 DIAGNOSIS — M79642 Pain in left hand: Secondary | ICD-10-CM

## 2021-11-27 DIAGNOSIS — M25642 Stiffness of left hand, not elsewhere classified: Secondary | ICD-10-CM

## 2021-11-27 DIAGNOSIS — M6281 Muscle weakness (generalized): Secondary | ICD-10-CM | POA: Diagnosis not present

## 2021-11-27 DIAGNOSIS — R600 Localized edema: Secondary | ICD-10-CM

## 2021-11-27 NOTE — Therapy (Signed)
?OUTPATIENT OCCUPATIONAL THERAPY TREATMENT NOTE ? ? ?Patient Name: [name] [name] ?MRN: [id number] ?DOB:08/03/1951, 71 y.o., female ?Today's Date: 11/27/2021 ? ?PCP: Colon Branch, MD ?REFERRING PROVIDER: Colon Branch, MD ? ? OT End of Session - 11/27/21 1521   ? ? Visit Number 7   ? Number of Visits 14   ? Date for OT Re-Evaluation 12/27/21   ? Authorization Type Medicare   ? Progress Note Due on Visit 10   ? OT Start Time 1520   ? OT Stop Time 6295   ? OT Time Calculation (min) 43 min   ? Activity Tolerance Patient tolerated treatment well;No increased pain;Patient limited by pain   ? Behavior During Therapy [facility] for tasks assessed/performed   behaviors better today, less apparent memory issues, but still noted when asking for teach-back  ? ?  ?  ? ?  ? ? ? ? ? ? ? ?Past Medical History:  ?Diagnosis Date  ? Anemia   ? duering cancer treatment   ? Anxiety   ? Arthritis   ? OA hands   ? Bilateral lower extremity pain   ? Blood transfusion without reported diagnosis   ? during chemo for lung cancer  ? Cataract   ? forming  but very small   ? Cholelithiasis   ? Colon polyps   ? adenomatous  ? Concussion 08/25/2021  ? ETOHdependence [facility])   ? High cholesterol   ? History of chemotherapy   ? for lung cancer- completed 2013  ? Hx of radiation therapy 10/07/11 to 11/20/11  ? right lung  ? Hx of radiation therapy 01/07/2012- 01/21/12  ? cranial irradiation  ? Hypertension   ? Lung cancer ([place]) 09/19/2011  ? Stage III currently in remission  ? Malignant melanoma of skin of canthus of right eye ([place]) 02/2017  ? Osteoporosis 04/2014  ? Dexa scan by Dr. Corinna Capra, T-score -2.7, next in 2 years, with at least 13 % major fracture in 10 years  ? Pitting edema   ? Bilateral lower extremities, duplex ultrasound 04/03/2015 normal, no DVT  ? ?Past Surgical History:  ?Procedure Laterality Date  ? CHOLECYSTECTOMY  04/2011  ? biliary stent placement  ? CLOSED REDUCTION FINGER WITH PERCUTANEOUS PINNING Left 10/23/2021  ? Procedure: LEFT INDEX,  MIDDLE, RING, AND SMALL FINGER CLOSED REDUCTION FINGER WITH PERCUTANEOUS PINNING;  Surgeon: Sherilyn Cooter, MD;  Location: [place];  Service: Orthopedics;  Laterality: Left;  ? COLONOSCOPY    ? [place] REPAIR  2015  ? MOHS SURGERY Right 02/2017  ? cheek and eyelid  ? Josph Macho Touch    ? Vaginal procedure  ? POLYPECTOMY    ? TUBAL LIGATION    ? ?Patient Active Problem List  ? Diagnosis Date Noted  ? Multiple closed fractures of proximal phalanx of finger with routine healing 10/08/2021  ? EtOH dependence ([place]) 09/11/2021  ? Vitamin B 12 deficiency 09/11/2021  ? Post-traumatic headache 09/11/2021  ? Insomnia 09/11/2021  ? Post concussion syndrome 08/30/2021  ? Benign essential HTN   ? Hyperglycemia   ? Urinary incontinence   ? Pelvic pain   ? Closed pelvic fracture ([place]) 08/03/2020  ? Closed fracture of left superior pubic ramus ([place]) 07/30/2020  ? Left hip pain 07/30/2020  ? GAD (generalized anxiety disorder) 07/30/2020  ? DJD (degenerative joint disease) 09/20/2019  ? Familial hyperlipidemia 07/01/2018  ? Right shoulder pain 07/14/2017  ? Malignant melanoma of skin of canthus of right eye ([place])  03/16/2017  ? PCP NOTES >>>>>>>>>>>>>>> 05/22/2015  ? High coronary artery calcium score 02/14/2015  ? Hernia, abdominal 04/01/2013  ? Depression with anxiety 04/01/2012  ? Malignant neoplasm of upper lobe, bronchus or lung 09/30/2011  ? H/O: lung cancer 09/25/2011  ? Annual physical exam 09/05/2011  ? IBS -diarrhea predominant 09/05/2011  ? COPD (chronic obstructive pulmonary disease) ([place]) 09/05/2011  ? Osteoporosis 03/01/2010  ? TOBACCO ABUSE 11/14/2008  ? Dyslipidemia 05/18/2007  ? Essential hypertension 05/18/2007  ? ?ONSET DATE: 10/23/21 DOS, 10/03/21 DOI ?  ?REFERRING DIAG: S62.619D (ICD-10-CM) - Multiple closed fractures of proximal phalanx of finger with routine healing ? ?THERAPY DIAG:  ?Stiffness of left hand, not elsewhere classified ? ?Pain in left hand ? ?Muscle weakness  (generalized) ? ?Localized edema ? ? ?PERTINENT HISTORY: Per MD: "Status post screw fixation of left index, middle, ring, small fingers.  Early mobilization per [place] protocol for screws/plates." ? ?PRECAUTIONS: 5 weeks out from sx now (A/PROM, blocking, dynamics ok) ? ?SUBJECTIVE:  ?She states that she has been wearing her brace except not in the night as she "forgot" and could not find it.  ? ? ?PAIN:  ?Are you having pain? Yes, but better ?Rating: 1/10 soreness coming in today ? ? ?OBJECTIVE: (All objective assessments below are from initial evaluation on: 11/05/21 unless otherwise specified.)  ? ? ?HAND DOMINANCE: Right ?  ?ADLs: ?Overall ADLs: She states the things that bothers her the most are opening containers, purse, carrying things, etc.  ?  ?FUNCTIONAL OUTCOME MEASURES: ?Quick Dash: 70% impairment ?  ?UE ROM   2.8cm gap from pad thumb to pad SF, cannot make fist, can abd/add slightly (~10*). MF is most sore/painful and was taken for example of all stiff fingers. TAM in Lt MF is 29* today.  ? ?11/25/21: 0cm gap from pad thumb to SF now; 6.4cm gap from MF to full fist today. MF still most stiff ?  ?A/ROM Left ?11/05/21 Left  ?11/25/21  ?Thumb MCP (0-60)     ?Thumb IP (0-80)     ?Thumb Radial abd/add (0-55)     ?Thumb Palmar abd/add (0-45)     ?Thumb opposition to small finger     ?Index MCP (0-90)     ?Index PIP (0-100)     ?Index DIP (0-70)     ?Long MCP (0-90)  (+10)- 0  (+5) -35  ?Long PIP (0-100)  (-39) - 54  (-40) -60  ?Long DIP (0-70)  (+5) - 0  (+5) -16  ?Ring MCP (0-90)     ?Ring PIP (0-100)     ?Ring DIP (0-70)     ?Little MCP (0-90)     ?Little PIP (0-100)     ?Little DIP (0-70)     ?(Blank rows = not tested)  ?  ?Active ROM Right ?11/05/2021 Left ?11/05/2021 Left ?11/25/21  ?Wrist flexion 65 55 67  ?Wrist extension 55 15 35  ?(Blank rows = not tested) ?  ?  ?UE MMT:   NT at eval but at least 3-/5 MMT  ?  ?  ?HAND FUNCTION: ?Grip strength: Right: TBD lbs; Left: TBD lbs ?  ?COORDINATION: ?34 Hole Peg test:  Right: TBD sec; Left: TBD sec ?11/25/21: Box and Blocks: Left 29 11/25/21 (WFL is 61)  ?  ?SENSATION: ?Intact but decreased LT to all fingers, but states feels some paresthesia  ?  ?EDEMA: diffuse through fingers, hand, wrist lt side  ?  ?COGNITION: ?Overall cognitive status: Difficulty to assess and she  was somewhat tangential, apparent memory issues and somewhat impulsive. She made some strange comments today as well. ?Areas of impairment: Memory: Deficits moderate to severe ?Awareness: Deficits moderate lack of safety awareness ?Problem solving: Deficits needs substantial cues and assist ?Behavior: Impulsive & easily flustered when confused ?  ?OBSERVATIONS: 11/18/21: pt overtly confused, easy to agitate at times, states strange, abstract things at times.   ?  ?  ?TODAY'S TREATMENT:  ?11/27/21: She starts with composite fist open/close 5 mins in fluidotherapy. OT then does manual finger stretches at each joint, each finger and slowly, carefully re-educates on her doing HEP stretches and motions. Adds composite finger ext on table, which she demo's back states she can do well. She slowly reviews the rest of HEP, reviewing with OT, and doesn't have significant pains at end.  ? ?11/25/21: OT re-educates on bracing wear, and is now adapting further to slip hand in and out of back strap as she states not strong enough/can't figure out/remove strap. She then performs AROM inside fluidotherapy in composite fist flex/ext for 5 mins. She performs A/ROM for new measures today showing some increased wrist and finger motion (nothing "worse" today, though PIP J extension has not improved, either). She is re-educated that the new orthotic should be worn at night and when not exercising to help with PIP J flexion contractures (and she hasn't been wearing, per her admittance). She also performs fnl activities of box and blocks (scoring impaired levels now), and attempts to perform in-hand manipulations of translation with light objects,  edu that larger is easier, smaller is harder and tries a few things.  She states loosing her HEP handout, so OT re-prints that for her and the exercises there were gone over briefly as well.  ? ?  ?PATIENT EDUCATION: ?

## 2021-12-02 ENCOUNTER — Encounter: Payer: Self-pay | Admitting: Rehabilitative and Restorative Service Providers"

## 2021-12-02 ENCOUNTER — Ambulatory Visit (INDEPENDENT_AMBULATORY_CARE_PROVIDER_SITE_OTHER): Payer: Medicare Other | Admitting: Rehabilitative and Restorative Service Providers"

## 2021-12-02 DIAGNOSIS — M6281 Muscle weakness (generalized): Secondary | ICD-10-CM

## 2021-12-02 DIAGNOSIS — M25642 Stiffness of left hand, not elsewhere classified: Secondary | ICD-10-CM

## 2021-12-02 DIAGNOSIS — M79642 Pain in left hand: Secondary | ICD-10-CM

## 2021-12-02 DIAGNOSIS — R600 Localized edema: Secondary | ICD-10-CM

## 2021-12-02 NOTE — Therapy (Signed)
?OUTPATIENT OCCUPATIONAL THERAPY TREATMENT NOTE ? ? ?Patient Name: [name] [name] ?MRN: [id number] ?DOB:February 06, 1951, 71 y.o., female ?Today's Date: 12/02/2021 ? ?PCP: Colon Branch, MD ?REFERRING PROVIDER: Sherilyn Cooter, MD ? ? OT End of Session - 12/02/21 1430   ? ? Visit Number 8   ? Number of Visits 14   ? Date for OT Re-Evaluation 12/27/21   ? Authorization Type Medicare   ? Progress Note Due on Visit 10   ? OT Start Time 1430   ? OT Stop Time 9562   ? OT Time Calculation (min) 46 min   ? Activity Tolerance Patient tolerated treatment well;No increased pain;Patient limited by fatigue   ? Behavior During Therapy [facility] for tasks assessed/performed   behaviors better today, less apparent memory issues, but still noted when asking for teach-back  ? ?  ?  ? ?  ? ? ? ? ? ? ? ? ?Past Medical History:  ?Diagnosis Date  ? Anemia   ? duering cancer treatment   ? Anxiety   ? Arthritis   ? OA hands   ? Bilateral lower extremity pain   ? Blood transfusion without reported diagnosis   ? during chemo for lung cancer  ? Cataract   ? forming  but very small   ? Cholelithiasis   ? Colon polyps   ? adenomatous  ? Concussion 08/25/2021  ? ETOHdependence [facility])   ? High cholesterol   ? History of chemotherapy   ? for lung cancer- completed 2013  ? Hx of radiation therapy 10/07/11 to 11/20/11  ? right lung  ? Hx of radiation therapy 01/07/2012- 01/21/12  ? cranial irradiation  ? Hypertension   ? Lung cancer ([place]) 09/19/2011  ? Stage III currently in remission  ? Malignant melanoma of skin of canthus of right eye ([place]) 02/2017  ? Osteoporosis 04/2014  ? Dexa scan by Dr. Corinna Capra, T-score -2.7, next in 2 years, with at least 13 % major fracture in 10 years  ? Pitting edema   ? Bilateral lower extremities, duplex ultrasound 04/03/2015 normal, no DVT  ? ?Past Surgical History:  ?Procedure Laterality Date  ? CHOLECYSTECTOMY  04/2011  ? biliary stent placement  ? CLOSED REDUCTION FINGER WITH PERCUTANEOUS PINNING Left 10/23/2021  ? Procedure: LEFT  INDEX, MIDDLE, RING, AND SMALL FINGER CLOSED REDUCTION FINGER WITH PERCUTANEOUS PINNING;  Surgeon: Sherilyn Cooter, MD;  Location: [place];  Service: Orthopedics;  Laterality: Left;  ? COLONOSCOPY    ? [place] REPAIR  2015  ? MOHS SURGERY Right 02/2017  ? cheek and eyelid  ? Josph Macho Touch    ? Vaginal procedure  ? POLYPECTOMY    ? TUBAL LIGATION    ? ?Patient Active Problem List  ? Diagnosis Date Noted  ? Multiple closed fractures of proximal phalanx of finger with routine healing 10/08/2021  ? EtOH dependence ([place]) 09/11/2021  ? Vitamin B 12 deficiency 09/11/2021  ? Post-traumatic headache 09/11/2021  ? Insomnia 09/11/2021  ? Post concussion syndrome 08/30/2021  ? Benign essential HTN   ? Hyperglycemia   ? Urinary incontinence   ? Pelvic pain   ? Closed pelvic fracture ([place]) 08/03/2020  ? Closed fracture of left superior pubic ramus ([place]) 07/30/2020  ? Left hip pain 07/30/2020  ? GAD (generalized anxiety disorder) 07/30/2020  ? DJD (degenerative joint disease) 09/20/2019  ? Familial hyperlipidemia 07/01/2018  ? Right shoulder pain 07/14/2017  ? Malignant melanoma of skin of canthus of right eye ([place])  03/16/2017  ? PCP NOTES >>>>>>>>>>>>>>> 05/22/2015  ? High coronary artery calcium score 02/14/2015  ? Hernia, abdominal 04/01/2013  ? Depression with anxiety 04/01/2012  ? Malignant neoplasm of upper lobe, bronchus or lung 09/30/2011  ? H/O: lung cancer 09/25/2011  ? Annual physical exam 09/05/2011  ? IBS -diarrhea predominant 09/05/2011  ? COPD (chronic obstructive pulmonary disease) ([place]) 09/05/2011  ? Osteoporosis 03/01/2010  ? TOBACCO ABUSE 11/14/2008  ? Dyslipidemia 05/18/2007  ? Essential hypertension 05/18/2007  ? ?ONSET DATE: 10/23/21 DOS, 10/03/21 DOI ?  ?REFERRING DIAG: S62.619D (ICD-10-CM) - Multiple closed fractures of proximal phalanx of finger with routine healing ? ?THERAPY DIAG:  ?Pain in left hand ? ?Stiffness of left hand, not elsewhere classified ? ?Muscle weakness  (generalized) ? ?Localized edema ? ? ?PERTINENT HISTORY: Per MD: "Status post screw fixation of left index, middle, ring, small fingers.  Early mobilization per [place] protocol for screws/plates." ? ?PRECAUTIONS: 6 weeks out from sx now (A/PROM, blocking, dynamics ok) ? ?SUBJECTIVE:  ?She comes in asking questions about her HEP, ow to do basic stretches and motions that have been assigned for weeks now. She does seem to grasp them a bit better, but this shows some of the extent of her memory issues and attention issues today.  ? ? ?PAIN:  ?Are you having pain? Yes- very mild ?Rating: 1/10 soreness coming in today ? ? ?OBJECTIVE: (All objective assessments below are from initial evaluation on: 11/05/21 unless otherwise specified.)  ? ? ?HAND DOMINANCE: Right ?  ?ADLs: ?Overall ADLs: She states the things that bothers her the most are opening containers, purse, carrying things, etc.  ?  ?FUNCTIONAL OUTCOME MEASURES: ?Quick Dash: 70% impairment ?  ?UE ROM   2.8cm gap from pad thumb to pad SF, cannot make fist, can abd/add slightly (~10*). MF is most sore/painful and was taken for example of all stiff fingers. TAM in Lt MF is 29* today.  ? ?11/25/21: 0cm gap from pad thumb to SF now; 6.4cm gap from MF to full fist today. MF still most stiff ?  ?A/ROM Left ?11/05/21 Left  ?11/25/21  ?Thumb MCP (0-60)     ?Thumb IP (0-80)     ?Thumb Radial abd/add (0-55)     ?Thumb Palmar abd/add (0-45)     ?Thumb opposition to small finger     ?Index MCP (0-90)     ?Index PIP (0-100)     ?Index DIP (0-70)     ?Long MCP (0-90)  (+10)- 0  (+5) -35  ?Long PIP (0-100)  (-39) - 54  (-40) -60  ?Long DIP (0-70)  (+5) - 0  (+5) -16  ?Ring MCP (0-90)     ?Ring PIP (0-100)     ?Ring DIP (0-70)     ?Little MCP (0-90)     ?Little PIP (0-100)     ?Little DIP (0-70)     ?(Blank rows = not tested)  ?  ?Active ROM Right ?11/05/2021 Left ?11/05/2021 Left ?11/25/21  ?Wrist flexion 65 55 67  ?Wrist extension 55 15 35  ?(Blank rows = not tested) ?  ?  ?UE MMT:   NT  at eval but at least 3-/5 MMT  ?  ?  ?HAND FUNCTION: ?Grip strength: Right: TBD lbs; Left: TBD lbs ?  ?COORDINATION: ?13 Hole Peg test: Right: TBD sec; Left: TBD sec ?11/25/21: Box and Blocks: Left 29 11/25/21 (WFL is 61)  ?  ?SENSATION: ?Intact but decreased LT to all fingers, but states feels some  paresthesia  ?  ?EDEMA: diffuse through fingers, hand, wrist lt side  ?  ?COGNITION: ?Overall cognitive status: Difficulty to assess and she was somewhat tangential, apparent memory issues and somewhat impulsive. She made some strange comments today as well. ?Areas of impairment: Memory: Deficits moderate to severe ?Awareness: Deficits moderate lack of safety awareness ?Problem solving: Deficits needs substantial cues and assist ?Behavior: Impulsive & easily flustered when confused ?  ?OBSERVATIONS: 12/02/21: she has been more engaged in therapy, less frustration, but continues to have apparent memory issues with directions given out in session and week-to-week. ?  ?  ?TODAY'S TREATMENT:  ?12/02/21: She does composite hand AROM in fluidotherapy for there ex and heat modality, then she reviews all of HEP with OT again, looking at the pictures, taking notes, etc. She is tolerating stretches well with some mild discomfort. OT trims down orthotic to include IP Js only now (per typical protocols) and she is instructed to wear this only at night now. She is reminded to not pick up anything heavy with left hand yet. She was given new therapy putty as well and starts light strength with hand in limited ROM that she has available. This was not done painfully, and she feels an active stretch during. She is instructed to add to HEP, but stop if anything becomes sharply painful. We will add several new hand strength next session as tolerated.  ? ?Exercises ?- Wrist AROM Flexion Extension  - 4-6 x daily - 10-15 reps ?- Seated Wrist Flexion Stretch  - 4-6 x daily - 1 sets - 10-15 reps ?- Wrist Extension Stretch Pronated  - 3-4 x daily - 3-5  reps - 15 hold ?- Seated Finger MP Extension AROM with Blocking  - 4-6 x daily - 1 sets - 10-15 reps ?- Seated Finger Composite Flexion Stretch  - 4-6 x daily - 3-5 reps - 15 hold ?- Seated Thumb MP Flexion

## 2021-12-05 ENCOUNTER — Ambulatory Visit (INDEPENDENT_AMBULATORY_CARE_PROVIDER_SITE_OTHER): Payer: Medicare Other | Admitting: Rehabilitative and Restorative Service Providers"

## 2021-12-05 ENCOUNTER — Encounter: Payer: Self-pay | Admitting: Rehabilitative and Restorative Service Providers"

## 2021-12-05 DIAGNOSIS — M79642 Pain in left hand: Secondary | ICD-10-CM | POA: Diagnosis not present

## 2021-12-05 DIAGNOSIS — M6281 Muscle weakness (generalized): Secondary | ICD-10-CM

## 2021-12-05 DIAGNOSIS — R600 Localized edema: Secondary | ICD-10-CM

## 2021-12-05 DIAGNOSIS — M25642 Stiffness of left hand, not elsewhere classified: Secondary | ICD-10-CM

## 2021-12-05 NOTE — Therapy (Signed)
?OUTPATIENT OCCUPATIONAL THERAPY TREATMENT NOTE ? ? ?Patient Name: [name] [name] ?MRN: [id number] ?DOB:[birth year], 71 y.o., female ?Today's Date: 12/05/2021 ? ?PCP: Colon Branch, MD ?REFERRING PROVIDER: Sherilyn Cooter, MD ? ? OT End of Session - 12/05/21 1436   ? ? Visit Number 9   ? Number of Visits 14   ? Date for OT Re-Evaluation 12/27/21   ? Authorization Type Medicare   ? Progress Note Due on Visit 10   ? OT Start Time 1436   ? OT Stop Time 3664   ? OT Time Calculation (min) 40 min   ? Activity Tolerance Patient tolerated treatment well;No increased pain;Patient limited by fatigue   ? Behavior During Therapy [facility] for tasks assessed/performed   behaviors better today, less apparent memory issues, but still noted when asking for teach-back  ? ?  ?  ? ?  ? ? ? ? ? ? ? ? ? ?Past Medical History:  ?Diagnosis Date  ? Anemia   ? duering cancer treatment   ? Anxiety   ? Arthritis   ? OA hands   ? Bilateral lower extremity pain   ? Blood transfusion without reported diagnosis   ? during chemo for lung cancer  ? Cataract   ? forming  but very small   ? Cholelithiasis   ? Colon polyps   ? adenomatous  ? Concussion 08/25/2021  ? ETOHdependence [facility])   ? High cholesterol   ? History of chemotherapy   ? for lung cancer- completed 2013  ? Hx of radiation therapy 10/07/11 to 11/20/11  ? right lung  ? Hx of radiation therapy 01/07/2012- 01/21/12  ? cranial irradiation  ? Hypertension   ? Lung cancer ([place]) 09/19/2011  ? Stage III currently in remission  ? Malignant melanoma of skin of canthus of right eye ([place]) 02/2017  ? Osteoporosis 04/2014  ? Dexa scan by Dr. Corinna Capra, T-score -2.7, next in 2 years, with at least 13 % major fracture in 10 years  ? Pitting edema   ? Bilateral lower extremities, duplex ultrasound 04/03/2015 normal, no DVT  ? ?Past Surgical History:  ?Procedure Laterality Date  ? CHOLECYSTECTOMY  04/2011  ? biliary stent placement  ? CLOSED REDUCTION FINGER WITH PERCUTANEOUS PINNING Left 10/23/2021  ? Procedure:  LEFT INDEX, MIDDLE, RING, AND SMALL FINGER CLOSED REDUCTION FINGER WITH PERCUTANEOUS PINNING;  Surgeon: Sherilyn Cooter, MD;  Location: [place];  Service: Orthopedics;  Laterality: Left;  ? COLONOSCOPY    ? [place] REPAIR  2015  ? MOHS SURGERY Right 02/2017  ? cheek and eyelid  ? Josph Macho Touch    ? Vaginal procedure  ? POLYPECTOMY    ? TUBAL LIGATION    ? ?Patient Active Problem List  ? Diagnosis Date Noted  ? Multiple closed fractures of proximal phalanx of finger with routine healing 10/08/2021  ? EtOH dependence ([place]) 09/11/2021  ? Vitamin B 12 deficiency 09/11/2021  ? Post-traumatic headache 09/11/2021  ? Insomnia 09/11/2021  ? Post concussion syndrome 08/30/2021  ? Benign essential HTN   ? Hyperglycemia   ? Urinary incontinence   ? Pelvic pain   ? Closed pelvic fracture ([place]) 08/03/2020  ? Closed fracture of left superior pubic ramus ([place]) 07/30/2020  ? Left hip pain 07/30/2020  ? GAD (generalized anxiety disorder) 07/30/2020  ? DJD (degenerative joint disease) 09/20/2019  ? Familial hyperlipidemia 07/01/2018  ? Right shoulder pain 07/14/2017  ? Malignant melanoma of skin of canthus of right eye (  [place]) 03/16/2017  ? PCP NOTES >>>>>>>>>>>>>>> 05/22/2015  ? High coronary artery calcium score 02/14/2015  ? Hernia, abdominal 04/01/2013  ? Depression with anxiety 04/01/2012  ? Malignant neoplasm of upper lobe, bronchus or lung 09/30/2011  ? H/O: lung cancer 09/25/2011  ? Annual physical exam 09/05/2011  ? IBS -diarrhea predominant 09/05/2011  ? COPD (chronic obstructive pulmonary disease) ([place]) 09/05/2011  ? Osteoporosis 03/01/2010  ? TOBACCO ABUSE 11/14/2008  ? Dyslipidemia 05/18/2007  ? Essential hypertension 05/18/2007  ? ?ONSET DATE: 10/23/21 DOS, 10/03/21 DOI ?  ?REFERRING DIAG: S62.619D (ICD-10-CM) - Multiple closed fractures of proximal phalanx of finger with routine healing ? ?THERAPY DIAG:  ?Stiffness of left hand, not elsewhere classified ? ?Pain in left hand ? ?Muscle weakness  (generalized) ? ?Localized edema ? ? ?PERTINENT HISTORY: Per MD: "Status post screw fixation of left index, middle, ring, small fingers.  Early mobilization per [place] protocol for screws/plates." ? ?PRECAUTIONS: 6 weeks out from sx now ? ?SUBJECTIVE:  ?She comes in alone, upset, stating she has been arguing/fighting with her sister. Her sister drove her but is waiting in the car for her. She states doing HEP but is overtly still confused about don/doff orthotic and some HEP exercises. She states using left hand more automatically in daytime now for light weight tasks.  ? ? ?PAIN:  ?Are you having pain? Tes- mildly sore in hand ?Rating: 1-2/10 soreness coming in today ? ? ?OBJECTIVE: (All objective assessments below are from initial evaluation on: 11/05/21 unless otherwise specified.)  ? ? ?HAND DOMINANCE: Right ?  ?ADLs: ?Overall ADLs: She states the things that bothers her the most are opening containers, purse, carrying things, etc.  ?  ?FUNCTIONAL OUTCOME MEASURES: ?Quick Dash: 70% impairment ?  ?UE ROM   2.8cm gap from pad thumb to pad SF, cannot make fist, can abd/add slightly (~10*). MF is most sore/painful and was taken for example of all stiff fingers. TAM in Lt MF is 29* today.  ? ?11/25/21: 0cm gap from pad thumb to SF now; 6.4cm gap from MF to full fist today. MF still most stiff ?  ?A/ROM Left ?11/05/21 Left  ?11/25/21  ?Thumb MCP (0-60)     ?Thumb IP (0-80)     ?Thumb Radial abd/add (0-55)     ?Thumb Palmar abd/add (0-45)     ?Thumb opposition to small finger     ?Index MCP (0-90)     ?Index PIP (0-100)     ?Index DIP (0-70)     ?Long MCP (0-90)  (+10)- 0  (+5) -35  ?Long PIP (0-100)  (-39) - 54  (-40) -60  ?Long DIP (0-70)  (+5) - 0  (+5) -16  ?Ring MCP (0-90)     ?Ring PIP (0-100)     ?Ring DIP (0-70)     ?Little MCP (0-90)     ?Little PIP (0-100)     ?Little DIP (0-70)     ?(Blank rows = not tested)  ?  ?Active ROM Right ?11/05/2021 Left ?11/05/2021 Left ?11/25/21  ?Wrist flexion 65 55 67  ?Wrist extension  55 15 35  ?(Blank rows = not tested) ?  ?  ?UE MMT:   NT at eval but at least 3-/5 MMT  ?  ?  ?HAND FUNCTION: ?Grip strength: Right: TBD lbs; Left: TBD lbs ?  ?COORDINATION: ?2 Hole Peg test: Right: TBD sec; Left: TBD sec ?11/25/21: Box and Blocks: Left 29 11/25/21 (WFL is 61)  ?  ?SENSATION: ?Intact but decreased  LT to all fingers, but states feels some paresthesia  ?  ?EDEMA: diffuse through fingers, hand, wrist lt side  ?  ?COGNITION: ?Overall cognitive status: Difficulty to assess and she was somewhat tangential, apparent memory issues and somewhat impulsive. She made some strange comments today as well. ?Areas of impairment: Memory: Deficits moderate to severe ?Awareness: Deficits moderate lack of safety awareness ?Problem solving: Deficits needs substantial cues and assist ?Behavior: Impulsive & easily flustered when confused ?  ?OBSERVATIONS: 12/02/21: she has been more engaged in therapy, less frustration, but continues to have apparent memory issues with directions given out in session and week-to-week. ?  ?  ?TODAY'S TREATMENT:  ?12/05/21: We start with review of night brace wear and don/doff brace, then OT works 1-on-1 with her to go over HEP thoroughly and new hand putty strength. Several exercises were removed today (wrist flex stretch, thumb opposition, and others) that were now no longer needed as she has made some improvements (also to make HEP easier to understand and follow). See below for updated list. OT also reviews home safety recommendations, as always, advising to wear night orthotic, don't lift anything >5lbs for now, etc. She states understanding.  ? ?Exercises ?- Wrist Prayer Stretch  - 2-3 x daily - 3-5 reps - 15 sec hold ?- Seated Finger Composite Flexion Stretch  - 4-6 x daily - 3-5 reps - 15 hold ?- PUSH KNUCKLES DOWN  - 4-6 x daily - 5 reps - 15 seconds hold ?- Full Fist  - 2-3 x daily - 5 reps ?- Seated Claw Fist with Putty  - 2-3 x daily - 5 reps ?- "Duck Mouth" Strength  - 2-3 x daily -  5 reps ?- Finger Extension "Pizza!"   - 2-3 x daily - 5 reps ? ?  ?PATIENT EDUCATION: ?Education details: see tx section above for details  ?Person educated: Patient ?Education method: Explanation, Higher education careers adviser

## 2021-12-09 ENCOUNTER — Encounter: Payer: Self-pay | Admitting: Rehabilitative and Restorative Service Providers"

## 2021-12-09 ENCOUNTER — Ambulatory Visit (INDEPENDENT_AMBULATORY_CARE_PROVIDER_SITE_OTHER): Payer: Medicare Other | Admitting: Rehabilitative and Restorative Service Providers"

## 2021-12-09 DIAGNOSIS — R600 Localized edema: Secondary | ICD-10-CM

## 2021-12-09 DIAGNOSIS — M6281 Muscle weakness (generalized): Secondary | ICD-10-CM

## 2021-12-09 DIAGNOSIS — M25642 Stiffness of left hand, not elsewhere classified: Secondary | ICD-10-CM

## 2021-12-09 DIAGNOSIS — M79642 Pain in left hand: Secondary | ICD-10-CM | POA: Diagnosis not present

## 2021-12-09 NOTE — Therapy (Signed)
?OUTPATIENT OCCUPATIONAL THERAPY TREATMENT & PROGRESS NOTE ? ? ?Patient Name: [name] [name] ?MRN: [id number] ?DOB:1951-04-26, 71 y.o., female ?Today's Date: 12/09/2021 ? ?PCP: Colon Branch, MD ?REFERRING PROVIDER: Sherilyn Cooter, MD ? ?Progress Note ?Reporting Period 11/05/21 to 12/09/21. ? ?See note below for Objective Data and Assessment of Progress/Goals.  ? ?  ? OT End of Session - 12/09/21 1509   ? ? Visit Number 10   ? Number of Visits 14   ? Date for OT Re-Evaluation 12/27/21   ? Authorization Type Medicare   ? Progress Note Due on Visit 10   ? OT Start Time 1512   ? OT Stop Time 9381   ? OT Time Calculation (min) 51 min   ? Activity Tolerance Patient tolerated treatment well;No increased pain;Patient limited by fatigue   ? Behavior During Therapy [facility] for tasks assessed/performed   behaviors better today, less apparent memory issues, but still noted when asking for teach-back  ? ?  ?  ? ?  ? ? ? ? ? ? ? ? ? ? ?Past Medical History:  ?Diagnosis Date  ? Anemia   ? duering cancer treatment   ? Anxiety   ? Arthritis   ? OA hands   ? Bilateral lower extremity pain   ? Blood transfusion without reported diagnosis   ? during chemo for lung cancer  ? Cataract   ? forming  but very small   ? Cholelithiasis   ? Colon polyps   ? adenomatous  ? Concussion 08/25/2021  ? ETOHdependence [facility])   ? High cholesterol   ? History of chemotherapy   ? for lung cancer- completed 2013  ? Hx of radiation therapy 10/07/11 to 11/20/11  ? right lung  ? Hx of radiation therapy 01/07/2012- 01/21/12  ? cranial irradiation  ? Hypertension   ? Lung cancer ([place]) 09/19/2011  ? Stage III currently in remission  ? Malignant melanoma of skin of canthus of right eye ([place]) 02/2017  ? Osteoporosis 04/2014  ? Dexa scan by Dr. Corinna Capra, T-score -2.7, next in 2 years, with at least 13 % major fracture in 10 years  ? Pitting edema   ? Bilateral lower extremities, duplex ultrasound 04/03/2015 normal, no DVT  ? ?Past Surgical History:  ?Procedure Laterality  Date  ? CHOLECYSTECTOMY  04/2011  ? biliary stent placement  ? CLOSED REDUCTION FINGER WITH PERCUTANEOUS PINNING Left 10/23/2021  ? Procedure: LEFT INDEX, MIDDLE, RING, AND SMALL FINGER CLOSED REDUCTION FINGER WITH PERCUTANEOUS PINNING;  Surgeon: Sherilyn Cooter, MD;  Location: [place];  Service: Orthopedics;  Laterality: Left;  ? COLONOSCOPY    ? [place] REPAIR  2015  ? MOHS SURGERY Right 02/2017  ? cheek and eyelid  ? Josph Macho Touch    ? Vaginal procedure  ? POLYPECTOMY    ? TUBAL LIGATION    ? ?Patient Active Problem List  ? Diagnosis Date Noted  ? Multiple closed fractures of proximal phalanx of finger with routine healing 10/08/2021  ? EtOH dependence ([place]) 09/11/2021  ? Vitamin B 12 deficiency 09/11/2021  ? Post-traumatic headache 09/11/2021  ? Insomnia 09/11/2021  ? Post concussion syndrome 08/30/2021  ? Benign essential HTN   ? Hyperglycemia   ? Urinary incontinence   ? Pelvic pain   ? Closed pelvic fracture ([place]) 08/03/2020  ? Closed fracture of left superior pubic ramus ([place]) 07/30/2020  ? Left hip pain 07/30/2020  ? GAD (generalized anxiety disorder) 07/30/2020  ? DJD (degenerative  joint disease) 09/20/2019  ? Familial hyperlipidemia 07/01/2018  ? Right shoulder pain 07/14/2017  ? Malignant melanoma of skin of canthus of right eye ([place]) 03/16/2017  ? PCP NOTES >>>>>>>>>>>>>>> 05/22/2015  ? High coronary artery calcium score 02/14/2015  ? Hernia, abdominal 04/01/2013  ? Depression with anxiety 04/01/2012  ? Malignant neoplasm of upper lobe, bronchus or lung 09/30/2011  ? H/O: lung cancer 09/25/2011  ? Annual physical exam 09/05/2011  ? IBS -diarrhea predominant 09/05/2011  ? COPD (chronic obstructive pulmonary disease) ([place]) 09/05/2011  ? Osteoporosis 03/01/2010  ? TOBACCO ABUSE 11/14/2008  ? Dyslipidemia 05/18/2007  ? Essential hypertension 05/18/2007  ? ?ONSET DATE: 10/23/21 DOS, 10/03/21 DOI ?  ?REFERRING DIAG: S62.619D (ICD-10-CM) - Multiple closed fractures of proximal phalanx  of finger with routine healing ? ?THERAPY DIAG:  ?Stiffness of left hand, not elsewhere classified ? ?Pain in left hand ? ?Muscle weakness (generalized) ? ?Localized edema ? ? ?PERTINENT HISTORY: Per MD: "Status post screw fixation of left index, middle, ring, small fingers.  Early mobilization per [place] protocol for screws/plates." ? ?PRECAUTIONS: ~7 weeks out from sx now ? ?SUBJECTIVE:  ?She states not having as many issues at home, states some pain in the night- tingling/burning on dorsal hand. She was advised to keep rubbing/touching dorsum of hand. 4-6 x day.  ? ?PAIN:  ?Are you having pain? Not at rest today,  ?Rating: 0/10 soreness coming in today, just stiff  ? ? ?OBJECTIVE: (All objective assessments below are from initial evaluation on: 11/05/21 unless otherwise specified.)  ? ? ?HAND DOMINANCE: Right ?  ?ADLs: ?Overall ADLs: She states the things that bothers her the most are opening containers, purse, carrying things, etc.  ?  ?FUNCTIONAL OUTCOME MEASURES: ?Quick Dash: 70% impairment (at eval) ? ?12/09/21: 20% impairment today ?  ?UE ROM   2.8cm gap from pad thumb to pad SF, cannot make fist, can abd/add slightly (~10*). MF is most sore/painful and was taken for example of all stiff fingers. TAM in Lt MF is 29* today.  ? ?11/25/21: 0cm gap from pad thumb to SF now; 6.4cm gap from MF to full fist today. MF still most stiff ? ?12/09/21: 5.8cm gap from pad MF to [facility]- continues to improve  ?  ?A/ROM Left ?11/05/21 Left  ?11/25/21 Left  ?12/09/21  ?Thumb MCP (0-60)      ?Thumb IP (0-80)      ?Thumb Radial abd/add (0-55)      ?Thumb Palmar abd/add (0-45)      ?Thumb opposition to small finger      ?Index MCP (0-90)      ?Index PIP (0-100)      ?Index DIP (0-70)      ?Long MCP (0-90)  (+10)- 0  (+5) -35 0 - 39  ?Long PIP (0-100)  (-39) - 54  (-40) -60 (-39) - 61  ?Long DIP (0-70)  (+5) - 0  (+5) -16 (+5) - 22  ?Ring MCP (0-90)      ?Ring PIP (0-100)      ?Ring DIP (0-70)      ?Little MCP (0-90)      ?Little PIP  (0-100)      ?Little DIP (0-70)      ?(Blank rows = not tested)  ?  ?Active ROM Right ?11/05/2021 Left ?11/05/2021 Left ?11/25/21  ?Wrist flexion 65 55 67  ?Wrist extension 55 15 35  ?(Blank rows = not tested) ?  ?  ?UE MMT:   NT at eval but  at least 3-/5 MMT  ?  ?  ?HAND FUNCTION: ?12/09/21: Grip strength: Right: 43 lbs; Left: 6 lbs ?  ?COORDINATION: ?11/25/21: Box and Blocks: Left 29 11/25/21 (WFL is 61)  ? ?12/09/21: 9 Hole Peg test: Right: 26 sec; Left: 43 sec ?  ?SENSATION: ?Intact but decreased LT to all fingers, but states feels some paresthesia  ?  ?EDEMA: diffuse through fingers, hand, wrist lt side  ?  ?COGNITION: ?11/05/21 initial eval: Overall cognitive status: Difficulty to assess and she was somewhat tangential, apparent memory issues and somewhat impulsive. She made some strange comments today as well. ?Areas of impairment: Memory: Deficits moderate to severe ?Awareness: Deficits moderate lack of safety awareness ?Problem solving: Deficits needs substantial cues and assist ?Behavior: Impulsive & easily flustered when confused ? ?12/09/21: General cognition seems a bit better now, memory still an issue but less impulsive and frustrated lately. Better focus in therapy sessions  ?  ? ?OBSERVATIONS: 12/09/21: She has had a better attitude, been more focused in therapy, has been still forgetful at times, but no significant sings of safety issues right now.  ? ?12/02/21: she has been more engaged in therapy, less frustration, but continues to have apparent memory issues with directions given out in session and week-to-week. ?  ?  ?TODAY'S TREATMENT:  ?12/09/21: She performs gripping, AROM at fingers, wrist and thumb for exercises as well as new measures today. All measures improved, but PIP Js are tight/somewhat contracted. This has been a worry since orthotic wear and HEP compliance was poor during initial recovery. Compliance has been somewhat better. She also discusses home care and self-care issues with OT and states  much better functional ability now. She performs FMS peg test and shows impaired skills in left hand, but better than prior. She also reviews HEP and performs AROM, stretching and strengthening exercises w

## 2021-12-12 ENCOUNTER — Encounter: Payer: Self-pay | Admitting: Rehabilitative and Restorative Service Providers"

## 2021-12-12 ENCOUNTER — Ambulatory Visit (INDEPENDENT_AMBULATORY_CARE_PROVIDER_SITE_OTHER): Payer: Medicare Other | Admitting: Rehabilitative and Restorative Service Providers"

## 2021-12-12 DIAGNOSIS — M6281 Muscle weakness (generalized): Secondary | ICD-10-CM | POA: Diagnosis not present

## 2021-12-12 DIAGNOSIS — M25642 Stiffness of left hand, not elsewhere classified: Secondary | ICD-10-CM

## 2021-12-12 DIAGNOSIS — R600 Localized edema: Secondary | ICD-10-CM | POA: Diagnosis not present

## 2021-12-12 DIAGNOSIS — M79642 Pain in left hand: Secondary | ICD-10-CM

## 2021-12-12 NOTE — Therapy (Signed)
?OUTPATIENT OCCUPATIONAL THERAPY TREATMENT & PROGRESS NOTE ? ? ?Patient Name: [name] [name] ?MRN: [id number] ?DOB:14-Feb-1951, 71 y.o., female ?Today's Date: 12/12/2021 ? ?PCP: Colon Branch, MD ?REFERRING PROVIDER: Sherilyn Cooter, MD ? ? ? ?  ? OT End of Session - 12/12/21 1435   ? ? Visit Number 11   ? Number of Visits 14   ? Date for OT Re-Evaluation 12/27/21   ? Authorization Type Medicare   ? Progress Note Due on Visit 20   ? OT Start Time 1435   ? OT Stop Time 1515   ? OT Time Calculation (min) 40 min   ? Activity Tolerance Patient tolerated treatment well;No increased pain;Patient limited by fatigue   ? Behavior During Therapy [facility] for tasks assessed/performed   behaviors better today, less apparent memory issues, but still noted when asking for teach-back  ? ?  ?  ? ?  ? ? ? ? ? ? ? ? ? ? ? ?Past Medical History:  ?Diagnosis Date  ? Anemia   ? duering cancer treatment   ? Anxiety   ? Arthritis   ? OA hands   ? Bilateral lower extremity pain   ? Blood transfusion without reported diagnosis   ? during chemo for lung cancer  ? Cataract   ? forming  but very small   ? Cholelithiasis   ? Colon polyps   ? adenomatous  ? Concussion 08/25/2021  ? [facility]TOH[facility])   ? High cholesterol   ? History of chemotherapy   ? for lung cancer- completed 2013  ? Hx of radiation therapy 10/07/11 to 11/20/11  ? right lung  ? Hx of radiation therapy 01/07/2012- 01/21/12  ? cranial irradiation  ? Hypertension   ? Lung cancer ([place]) 09/19/2011  ? Stage III currently in remission  ? Malignant melanoma of skin of canthus of right eye ([place]) 02/2017  ? Osteoporosis 04/2014  ? Dexa scan by Dr. Corinna Capra, T-score -2.7, next in 2 years, with at least 13 % major fracture in 10 years  ? Pitting edema   ? Bilateral lower extremities, duplex ultrasound 04/03/2015 normal, no DVT  ? ?Past Surgical History:  ?Procedure Laterality Date  ? CHOLECYSTECTOMY  04/2011  ? biliary stent placement  ? CLOSED REDUCTION FINGER WITH PERCUTANEOUS PINNING Left  10/23/2021  ? Procedure: LEFT INDEX, MIDDLE, RING, AND SMALL FINGER CLOSED REDUCTION FINGER WITH PERCUTANEOUS PINNING;  Surgeon: Sherilyn Cooter, MD;  Location: [place];  Service: Orthopedics;  Laterality: Left;  ? COLONOSCOPY    ? [place] REPAIR  2015  ? MOHS SURGERY Right 02/2017  ? cheek and eyelid  ? Josph Macho Touch    ? Vaginal procedure  ? POLYPECTOMY    ? TUBAL LIGATION    ? ?Patient Active Problem List  ? Diagnosis Date Noted  ? Multiple closed fractures of proximal phalanx of finger 10/08/2021  ? EtOH dependence ([place]) 09/11/2021  ? Vitamin B 12 deficiency 09/11/2021  ? Post-traumatic headache 09/11/2021  ? Insomnia 09/11/2021  ? Post concussion syndrome 08/30/2021  ? Benign essential HTN   ? Hyperglycemia   ? Urinary incontinence   ? Pelvic pain   ? Closed pelvic fracture ([place]) 08/03/2020  ? Closed fracture of left superior pubic ramus ([place]) 07/30/2020  ? Left hip pain 07/30/2020  ? GAD (generalized anxiety disorder) 07/30/2020  ? DJD (degenerative joint disease) 09/20/2019  ? Familial hyperlipidemia 07/01/2018  ? Right shoulder pain 07/14/2017  ? Malignant melanoma of skin  of canthus of right eye ([place]) 03/16/2017  ? PCP NOTES >>>>>>>>>>>>>>> 05/22/2015  ? High coronary artery calcium score 02/14/2015  ? Hernia, abdominal 04/01/2013  ? Depression with anxiety 04/01/2012  ? Malignant neoplasm of upper lobe, bronchus or lung 09/30/2011  ? H/O: lung cancer 09/25/2011  ? Annual physical exam 09/05/2011  ? IBS -diarrhea predominant 09/05/2011  ? COPD (chronic obstructive pulmonary disease) ([place]) 09/05/2011  ? Osteoporosis 03/01/2010  ? TOBACCO ABUSE 11/14/2008  ? Dyslipidemia 05/18/2007  ? Essential hypertension 05/18/2007  ? ?ONSET DATE: 10/23/21 DOS, 10/03/21 DOI ?  ?REFERRING DIAG: S62.619D (ICD-10-CM) - Multiple closed fractures of proximal phalanx of finger with routine healing ? ?THERAPY DIAG:  ?Stiffness of left hand, not elsewhere classified ? ?Pain in left hand ? ?Muscle weakness  (generalized) ? ?Localized edema ? ? ?PERTINENT HISTORY: Per MD: "Status post screw fixation of left index, middle, ring, small fingers.  Early mobilization per [place] protocol for screws/plates." ? ?PRECAUTIONS: 7 weeks out from sx now ? ?SUBJECTIVE:  ?She states doing better, using hand more at home now. She also states not doing any HEP today yet.  ? ?PAIN:  ?Are you having pain? Not at rest today  ?Rating: 0/10 soreness coming in today, just stiff  ? ?OBJECTIVE: (All objective assessments below are from initial evaluation on: 11/05/21 unless otherwise specified.)  ? ? ?HAND DOMINANCE: Right ?  ?ADLs: ?Overall ADLs: She states the things that bothers her the most are opening containers, purse, carrying things, etc.  ?  ?FUNCTIONAL OUTCOME MEASURES: ?Quick Dash: 70% impairment (at eval) ? ?12/09/21: 20% impairment today ?  ?UE ROM   2.8cm gap from pad thumb to pad SF, cannot make fist, can abd/add slightly (~10*). MF is most sore/painful and was taken for example of all stiff fingers. TAM in Lt MF is 29* today.  ? ?11/25/21: 0cm gap from pad thumb to SF now; 6.4cm gap from MF to full fist today. MF still most stiff ? ?12/09/21: 5.8cm gap from pad MF to [facility]- continues to improve  ?  ?A/ROM Left ?11/05/21 Left  ?11/25/21 Left  ?12/09/21  ?Thumb MCP (0-60)      ?Thumb IP (0-80)      ?Thumb Radial abd/add (0-55)      ?Thumb Palmar abd/add (0-45)      ?Thumb opposition to small finger      ?Index MCP (0-90)      ?Index PIP (0-100)      ?Index DIP (0-70)      ?Long MCP (0-90)  (+10)- 0  (+5) -35 0 - 39  ?Long PIP (0-100)  (-39) - 54  (-40) -60 (-39) - 61  ?Long DIP (0-70)  (+5) - 0  (+5) -16 (+5) - 22  ?Ring MCP (0-90)      ?Ring PIP (0-100)      ?Ring DIP (0-70)      ?Little MCP (0-90)      ?Little PIP (0-100)      ?Little DIP (0-70)      ?(Blank rows = not tested)  ?  ?Active ROM Right ?11/05/2021 Left ?11/05/2021 Left ?11/25/21  ?Wrist flexion 65 55 67  ?Wrist extension 55 15 35  ?(Blank rows = not tested) ?  ?  ?UE MMT:   NT at  eval but at least 3-/5 MMT  ?  ?  ?HAND FUNCTION: ?12/09/21: Grip strength: Right: 43 lbs; Left: 6 lbs ?  ?COORDINATION: ?11/25/21: Box and Blocks: Left 29 11/25/21 (WFL is 61)  ? ?  12/09/21: 9 Hole Peg test: Right: 26 sec; Left: 43 sec ?  ?SENSATION: ?Intact but decreased LT to all fingers, but states feels some paresthesia  ?  ?EDEMA: diffuse through fingers, hand, wrist lt side  ?  ?COGNITION: ?11/05/21 initial eval: Overall cognitive status: Difficulty to assess and she was somewhat tangential, apparent memory issues and somewhat impulsive. She made some strange comments today as well. ?Areas of impairment: Memory: Deficits moderate to severe ?Awareness: Deficits moderate lack of safety awareness ?Problem solving: Deficits needs substantial cues and assist ?Behavior: Impulsive & easily flustered when confused ? ?12/09/21: General cognition seems a bit better now, memory still an issue but less impulsive and frustrated lately. Better focus in therapy sessions  ? ?12/12/21: She is somewhat vacuous today and makes some inappropriate sexual jokes toward therapist.  She also mentions not wanting to tell her PCP about her fractures because "he will think I'm crazy." OT suggests being open/honest with her doctor.  ?  ? ?OBSERVATIONS: 12/09/21: She has had a better attitude, been more focused in therapy, has been still forgetful at times, but no significant sings of safety issues right now.  ? ?12/02/21: she has been more engaged in therapy, less frustration, but continues to have apparent memory issues with directions given out in session and week-to-week. ?  ?  ?TODAY'S TREATMENT:  ?12/12/21: She reviews all HEP with OT, performing each with 15 second holds for stretches and numerous verbal and physical cues. The entire session was spent on this review and addition of last 3 putty exercises.  She continues to need many and frequent cues for poor recall/memory and non- compliance with HEP.  ? ?Exercises ?- Wrist Prayer Stretch  -  2-3 x daily - 3-5 reps - 15 sec hold ?- Seated Finger Composite Flexion Stretch  - 4-6 x daily - 3-5 reps - 15 hold ?- PUSH KNUCKLES DOWN  - 4-6 x daily - 5 reps - 15 seconds hold ?- Full Fist  - 2-3 x

## 2021-12-13 ENCOUNTER — Encounter: Payer: Medicare Other | Admitting: Rehabilitative and Restorative Service Providers"

## 2021-12-16 ENCOUNTER — Ambulatory Visit (INDEPENDENT_AMBULATORY_CARE_PROVIDER_SITE_OTHER): Payer: Medicare Other | Admitting: Internal Medicine

## 2021-12-16 ENCOUNTER — Encounter: Payer: Self-pay | Admitting: Internal Medicine

## 2021-12-16 VITALS — BP 132/80 | HR 65 | Temp 97.9°F | Resp 16 | Ht 62.0 in | Wt 152.5 lb

## 2021-12-16 DIAGNOSIS — F1010 Alcohol abuse, uncomplicated: Secondary | ICD-10-CM | POA: Diagnosis not present

## 2021-12-16 DIAGNOSIS — E038 Other specified hypothyroidism: Secondary | ICD-10-CM | POA: Diagnosis not present

## 2021-12-16 DIAGNOSIS — I10 Essential (primary) hypertension: Secondary | ICD-10-CM | POA: Diagnosis not present

## 2021-12-16 DIAGNOSIS — G3184 Mild cognitive impairment, so stated: Secondary | ICD-10-CM | POA: Diagnosis not present

## 2021-12-16 DIAGNOSIS — Z20822 Contact with and (suspected) exposure to covid-19: Secondary | ICD-10-CM | POA: Diagnosis not present

## 2021-12-16 DIAGNOSIS — F418 Other specified anxiety disorders: Secondary | ICD-10-CM

## 2021-12-16 MED ORDER — HYDROXYZINE HCL 10 MG PO TABS: 10.0000 mg | ORAL_TABLET | Freq: Every evening | ORAL | 0 refills | Status: DC | PRN

## 2021-12-16 NOTE — Progress Notes (Signed)
? ?Subjective:  ? ? Patient ID: [name] [name], female    DOB: 1950/11/16, 71 y.o.   MRN: [id number] ? ?DOS:  12/16/2021 ?Type of visit - description: f/u ? ?Discussed multiple issues ? ?Since the last visit had another fall at home, injured the left hand, had surgery.  Still no fully recovered ? ?See telephone note from 11/20/2021, OT noted several issues: ?Memory issues and poor problem solving skills. ?Still drinking at night ?Probably not safe for her to be driving. ? ?At this point, the patient feels that it is safe for her to drive and have been doing so without any problems. ?Admits her memory is not the best but has no major problems. ? ?Still having insomnia ? ?EtOH still having 2 glasses of wine at night ? ?Review of Systems ?See above  ? ?Past Medical History:  ?Diagnosis Date  ? Anemia   ? duering cancer treatment   ? Anxiety   ? Arthritis   ? OA hands   ? Bilateral lower extremity pain   ? Blood transfusion without reported diagnosis   ? during chemo for lung cancer  ? Cataract   ? forming  but very small   ? Cholelithiasis   ? Colon polyps   ? adenomatous  ? Concussion 08/25/2021  ? [facility]TOH[facility])   ? High cholesterol   ? History of chemotherapy   ? for lung cancer- completed 2013  ? Hx of radiation therapy 10/07/11 to 11/20/11  ? right lung  ? Hx of radiation therapy 01/07/2012- 01/21/12  ? cranial irradiation  ? Hypertension   ? Lung cancer ([place]) 09/19/2011  ? Stage III currently in remission  ? Malignant melanoma of skin of canthus of right eye ([place]) 02/2017  ? Osteoporosis 04/2014  ? Dexa scan by Dr. Corinna Capra, T-score -2.7, next in 2 years, with at least 13 % major fracture in 10 years  ? Pitting edema   ? Bilateral lower extremities, duplex ultrasound 04/03/2015 normal, no DVT  ? ? ?Past Surgical History:  ?Procedure Laterality Date  ? CHOLECYSTECTOMY  04/2011  ? biliary stent placement  ? CLOSED REDUCTION FINGER WITH PERCUTANEOUS PINNING Left 10/23/2021  ? Procedure: LEFT INDEX, MIDDLE, RING, AND  SMALL FINGER CLOSED REDUCTION FINGER WITH PERCUTANEOUS PINNING;  Surgeon: Sherilyn Cooter, MD;  Location: [place];  Service: Orthopedics;  Laterality: Left;  ? COLONOSCOPY    ? [place] REPAIR  2015  ? MOHS SURGERY Right 02/2017  ? cheek and eyelid  ? Josph Macho Touch    ? Vaginal procedure  ? POLYPECTOMY    ? TUBAL LIGATION    ? ? ?Current Outpatient Medications  ?Medication Instructions  ? acetaminophen (TYLENOL) 500 mg, Every 6 hours PRN  ? aspirin EC 81 mg, Oral, Daily  ? carvedilol (COREG) 6.25 mg, Oral, 2 times daily with meals  ? citalopram (CELEXA) 20 mg, Oral, Daily  ? cyanocobalamin 1000 MCG tablet Take 2 tablets by mouth daily.  ? folic acid (FOLVITE) 1 mg, Oral, Daily  ? hydrOXYzine (ATARAX) 10-20 mg, Oral, At bedtime PRN  ? LORazepam (ATIVAN) 0.5 mg, Every 8 hours  ? raloxifene (EVISTA) 60 mg, Oral, Daily  ? REPATHA SURECLICK 967 MG/ML SOAJ INJECT 140 MG( 1 ML) UNDER THE SKIN EVERY 14 DAYS  ? rosuvastatin (CRESTOR) 40 mg, Oral, Daily at bedtime  ? thiamine 100 mg, Oral, Daily  ? Vitamin D3 4,000 Units, Daily  ? ? ?   ?Objective:  ? Physical Exam ?  BP 132/80 (BP Location: Left Arm, Patient Position: Sitting, Cuff Size: Small)   Pulse 65   Temp 97.9 ?F (36.6 ?C) (Oral)   Resp 16   Ht 5\' 2"  (1.575 m)   Wt 152 lb 8 oz (69.2 kg)   SpO2 97%   BMI 27.89 kg/m?  ?General:   ?Well developed, NAD, BMI noted. ?HEENT:  ?Normocephalic . Face symmetric, atraumatic ?Skin: Not pale. Not jaundice ?Neurologic:  ?alert & oriented X3. ?MMSE: 26 ?Speech normal, gait appropriate for age and unassisted ?Psych--  ?Cognition and judgment appear intact.  ?Cooperative with normal attention span and concentration.  ?Behavior appropriate. ?No anxious or depressed appearing.  ? ?   ?Assessment   ? ? ASSESSMENT  ?HTN ?Hyperlipidemia -- cramps with Lipitor dc 2015 ?Depression . Anxiety -- on citalopram, used xanax before, re start if needed ?Osteoporosis-- DEXAs  at gyn --->  was on boniva, now evista   ?B12 deficiency dx 01/2014 ?Vit D  def  ?COPD: Mild last PFTs  ?CV: Extensive coronary artery calcifications ?Oncology: ?-Lung cancer SCC DX 2013, XRT chest and brain ?-melanoma 2018, R face, s/p Mohs ?Lower extremity edema [place] (-) for DVT 03-2015 ?Coronary calcifications per CT chest 6 2016 ?IBS, diarrhea predominant   on Imodium as needed; GI 2015-- rx questran, self d/c d/t constipation ?ASA intolerant (tinnitus) but ok  taking low-dose ?Admitted 07-2020: Had a fall, L pubic rami fracture ?Admitted 08-2021: Fall, headache, EtOH, MS changes ? ?PLAN: ?HTN: BP today looks good, continue carvedilol.  Check CMP and CBC. ?Hyperlipidemia: On Repatha and Crestor ?Depression, anxiety: On citalopram, seems well controlled ?Insomnia: Ativan?  I declined to prescribe it, hydroxyzine recommended.  Rx sent. ?EtOH: Reports she is limiting her to 2 glasses of wine a night.  Complete abstinence is the goal.  Rec to continue thiamine, folic acid, Y64 supplements. ?MCI: MMSE is 26, moderate dementia.  Medications benefits discussed.  We agreed to delay decision to take medications for few more months. ?Increased TSH: Lab ?Left hand injury: ?10/04/2021: Had a fall, fell, had a L fracture multiple fingers ?Admitted 10/23/2021, had  fingers surgery -- reduction with percutaneous pinning ?Social: ?Lives by herself, sister was helping her more (like driving her around) however lately they are not getting along.  Sister still is making a list of her medications ?Last month, the OT informing that he was concerned about Sina driving, this was openly discussed with the patient, she feels it is safe for her to do so.  Declined to let me talk with her sister today ?RTC 3 months ? ?This visit occurred during the SARS-CoV-2 public health emergency.  Safety protocols were in place, including screening questions prior to the visit, additional usage of staff PPE, and extensive cleaning of exam room while observing appropriate contact time as  indicated for disinfecting solutions.  ? ?

## 2021-12-16 NOTE — Patient Instructions (Addendum)
Please schedule a Medicare Wellness visit.  ? ?For difficulty sleeping: Hydroxyzine 10 mg 1 or 2 at bedtime ? ?Go back on all the vitamins ? ?Please talk with your sister about driving.  Be sure is safe for you to do. ? ? ? ?GO TO THE LAB : Get the blood work   ? ? ?[place], [place] ?Come back for  a check up in 3 months  ? ? ? ?

## 2021-12-17 ENCOUNTER — Encounter: Payer: Medicare Other | Admitting: Rehabilitative and Restorative Service Providers"

## 2021-12-17 ENCOUNTER — Telehealth: Payer: Self-pay | Admitting: Rehabilitative and Restorative Service Providers"

## 2021-12-17 ENCOUNTER — Telehealth: Payer: Self-pay | Admitting: Internal Medicine

## 2021-12-17 LAB — CBC WITH DIFFERENTIAL/PLATELET
Basophils Absolute: 0.1 10*3/uL (ref 0.0–0.1)
Basophils Relative: 1 % (ref 0.0–3.0)
Eosinophils Absolute: 0.1 10*3/uL (ref 0.0–0.7)
Eosinophils Relative: 2.1 % (ref 0.0–5.0)
HCT: 37.3 % (ref 36.0–46.0)
Hemoglobin: 12.5 g/dL (ref 12.0–15.0)
Lymphocytes Relative: 12.6 % (ref 12.0–46.0)
Lymphs Abs: 0.9 10*3/uL (ref 0.7–4.0)
MCHC: 33.5 g/dL (ref 30.0–36.0)
MCV: 97.1 fl (ref 78.0–100.0)
Monocytes Absolute: 0.6 10*3/uL (ref 0.1–1.0)
Monocytes Relative: 8.8 % (ref 3.0–12.0)
Neutro Abs: 5.4 10*3/uL (ref 1.4–7.7)
Neutrophils Relative %: 75.5 % (ref 43.0–77.0)
Platelets: 210 10*3/uL (ref 150.0–400.0)
RBC: 3.84 Mil/uL — ABNORMAL LOW (ref 3.87–5.11)
RDW: 12.8 % (ref 11.5–15.5)
WBC: 7.1 10*3/uL (ref 4.0–10.5)

## 2021-12-17 LAB — TSH: TSH: 2.19 u[IU]/mL (ref 0.35–5.50)

## 2021-12-17 LAB — COMPREHENSIVE METABOLIC PANEL
ALT: 11 U/L (ref 0–35)
AST: 18 U/L (ref 0–37)
Albumin: 4.5 g/dL (ref 3.5–5.2)
Alkaline Phosphatase: 54 U/L (ref 39–117)
BUN: 16 mg/dL (ref 6–23)
CO2: 26 mEq/L (ref 19–32)
Calcium: 9.6 mg/dL (ref 8.4–10.5)
Chloride: 102 mEq/L (ref 96–112)
Creatinine, Ser: 0.86 mg/dL (ref 0.40–1.20)
GFR: 69.21 mL/min (ref >60.00)
Glucose, Bld: 88 mg/dL (ref 70–99)
Potassium: 4.1 mEq/L (ref 3.5–5.1)
Sodium: 138 mEq/L (ref 135–145)
Total Bilirubin: 0.3 mg/dL (ref 0.2–1.2)
Total Protein: 7 g/dL (ref 6.0–8.3)

## 2021-12-17 LAB — T4, FREE: Free T4: 0.6 ng/dL (ref 0.60–1.60)

## 2021-12-17 NOTE — Telephone Encounter (Signed)
Pt would like to speak with Larose Kells directly regarding yesterday's conversation. Please advise.  ?

## 2021-12-17 NOTE — Telephone Encounter (Signed)
Pt has called 3 times in the last hour requesting to speak directly with PCP about conversation they had yesterday about her driving.  ?

## 2021-12-17 NOTE — Telephone Encounter (Signed)
Pt called back right at 1:05 and stated she was told dr.Paz would be back from lunch and needs to speak with him about an urgent message. Advised pt a message would be sent to his nurse to see if he was available. When it was explained to her that he is not currently available, she demanded he make time for her. Advised pt he is very busy the rest of the afternoon but he will do his best to reach out to her as soon as possible and get this handled. She stated she cannot wait and needs to know who is "lying" to her. She stated her sister told her that she was told by Dr.Paz that the pt is not allowed to drive, and she is trying to figure out if her sister or Dr.Paz is lying because "no one" will tell her what to do. Advised pt she could make a virtual appt tomorrow with Dr.Paz to discuss this. She refused and stated she will not get off the line until he speaks with her. Notified Anderson Malta about the situation and pt spoke with her as well.  ?

## 2021-12-17 NOTE — Telephone Encounter (Signed)
I was at the front desk area when the patient made the latest call into the office and Joellen Jersey was on the phone with her.  Katie advised me as to what was going on and she said the patient would not let her get off the phone until she was able to speak with Dr. Larose Kells.  I advised Joellen Jersey I would speak with the patient and she transferred the patient to me.  The patient asked me if I needed to hear the entire story.  I advised her I was open to hearing anything and everything she wanted to tell me pertaining to what was currently going on.  Patient went on to say she thought her sister had called into the office to talk to Dr. Larose Kells about her driving privileges and now her sister is denying that she called the office and she wanted to know if Dr. Larose Kells had told her sister that she is unable to drive.  Her sister is on her DPR.  She stated she wanted to know who was lying to her.  She stated she was not going to get off the phone until she had a chance to speak with Dr. Larose Kells.  I advised we had just started up the afternoon session of patients and I could not get a provider out of rooms for phone calls.  I stated Dr. Larose Kells has full schedules and she interjected "bullshit" and said I have had to wait for him for twenty minutes before and I should go get him while he is sitting at the ledge.  She said, "he is just avoiding me."  I again advised her I would not interrupt patient care for him to come to the phone and that her doctor was not avoiding her.  I advised her to please not curse at me and she asked me what word did she say and I did not repeat it back and she said, "oh yea, I did say "bullshit" didn't I."   I was trying to advise patient we could set her up for a virtual visit for her to discuss driving privileges with Dr. Larose Kells tomorrow at 10:40 am, but she wanted nothing to do with a virtual visit.  She said maybe I should just drive up there and come see him.  I advised the patient that would not be the best way to go about  approaching Dr. Larose Kells.  She got even more aggravated with me and stated she would be seeking another primary care physician and would still take care of getting this matter handled to see just who was the one lying to her.  Patient hung up the phone on me. ?

## 2021-12-17 NOTE — Telephone Encounter (Signed)
OT called patient about missed appointment and to address concerns about her PCP f/u. Apparently she was very upset when the doctor brought up her fitness to drive and potential safety concerns at home. I was unable to reach her and her voice mailbox was full.  ? ?OT will try to reach her again as able. I'd like to let her know that I have been concerned about her and that I did contact her doctor about those concerns.  ?

## 2021-12-17 NOTE — Assessment & Plan Note (Addendum)
HTN: BP today looks good, continue carvedilol.  Check CMP and CBC. ?Hyperlipidemia: On Repatha and Crestor ?Depression, anxiety: On citalopram, seems well controlled ?Insomnia: Ativan?  I declined to prescribe it, hydroxyzine recommended.  Rx sent. ?EtOH: Reports she is limiting her to 2 glasses of wine a night.  Complete abstinence is the goal.  Rec to continue thiamine, folic acid, U86 supplements. ?MCI: MMSE is 26, moderate dementia.  Medications benefits discussed.  We agreed to delay decision to take medications for few more months. ?Increased TSH: Lab ?Left hand injury: ?10/04/2021: Had a fall, fell, had a L fracture multiple fingers ?Admitted 10/23/2021, had  fingers surgery -- reduction with percutaneous pinning ?Social: ?Lives by herself, sister was helping her more (like driving her around) however lately they are not getting along.  Sister still is making a list of her medications ?Last month, the OT informing that he was concerned about Abby driving, this was openly discussed with the patient, she feels it is safe for her to do so.  Declined to let me talk with her sister today ?RTC 3 months ?

## 2021-12-17 NOTE — Telephone Encounter (Signed)
Noted  

## 2021-12-17 NOTE — Therapy (Incomplete)
?OUTPATIENT OCCUPATIONAL THERAPY TREATMENT & PROGRESS NOTE ? ? ?Patient Name: [name] [name] ?MRN: [id number] ?DOB:1951/07/04, 71 y.o., female ?Today's Date: 12/17/2021 ? ?PCP: Colon Branch, MD ?REFERRING PROVIDER: Sherilyn Cooter, MD ? ? ? ?  ? ? ? ? ? ? ? ? ? ? ? ? ?Past Medical History:  ?Diagnosis Date  ? Anemia   ? duering cancer treatment   ? Anxiety   ? Arthritis   ? OA hands   ? Bilateral lower extremity pain   ? Blood transfusion without reported diagnosis   ? during chemo for lung cancer  ? Cataract   ? forming  but very small   ? Cholelithiasis   ? Colon polyps   ? adenomatous  ? Concussion 08/25/2021  ? ETOHdependence [facility])   ? High cholesterol   ? History of chemotherapy   ? for lung cancer- completed 2013  ? Hx of radiation therapy 10/07/11 to 11/20/11  ? right lung  ? Hx of radiation therapy 01/07/2012- 01/21/12  ? cranial irradiation  ? Hypertension   ? Lung cancer ([place]) 09/19/2011  ? Stage III currently in remission  ? Malignant melanoma of skin of canthus of right eye ([place]) 02/2017  ? Osteoporosis 04/2014  ? Dexa scan by Dr. Corinna Capra, T-score -2.7, next in 2 years, with at least 13 % major fracture in 10 years  ? Pitting edema   ? Bilateral lower extremities, duplex ultrasound 04/03/2015 normal, no DVT  ? ?Past Surgical History:  ?Procedure Laterality Date  ? CHOLECYSTECTOMY  04/2011  ? biliary stent placement  ? CLOSED REDUCTION FINGER WITH PERCUTANEOUS PINNING Left 10/23/2021  ? Procedure: LEFT INDEX, MIDDLE, RING, AND SMALL FINGER CLOSED REDUCTION FINGER WITH PERCUTANEOUS PINNING;  Surgeon: Sherilyn Cooter, MD;  Location: [place];  Service: Orthopedics;  Laterality: Left;  ? COLONOSCOPY    ? [place] REPAIR  2015  ? MOHS SURGERY Right 02/2017  ? cheek and eyelid  ? Josph Macho Touch    ? Vaginal procedure  ? POLYPECTOMY    ? TUBAL LIGATION    ? ?Patient Active Problem List  ? Diagnosis Date Noted  ? Multiple closed fractures of proximal phalanx of finger 10/08/2021  ? EtOH  dependence ([place]) 09/11/2021  ? Vitamin B 12 deficiency 09/11/2021  ? Post-traumatic headache 09/11/2021  ? Insomnia 09/11/2021  ? Post concussion syndrome 08/30/2021  ? Benign essential HTN   ? Hyperglycemia   ? Urinary incontinence   ? Pelvic pain   ? Closed pelvic fracture ([place]) 08/03/2020  ? Closed fracture of left superior pubic ramus ([place]) 07/30/2020  ? Left hip pain 07/30/2020  ? GAD (generalized anxiety disorder) 07/30/2020  ? DJD (degenerative joint disease) 09/20/2019  ? Familial hyperlipidemia 07/01/2018  ? Right shoulder pain 07/14/2017  ? Malignant melanoma of skin of canthus of right eye ([place]) 03/16/2017  ? PCP NOTES >>>>>>>>>>>>>>> 05/22/2015  ? High coronary artery calcium score 02/14/2015  ? Hernia, abdominal 04/01/2013  ? Depression with anxiety 04/01/2012  ? Malignant neoplasm of upper lobe, bronchus or lung 09/30/2011  ? H/O: lung cancer 09/25/2011  ? Annual physical exam 09/05/2011  ? IBS -diarrhea predominant 09/05/2011  ? COPD (chronic obstructive pulmonary disease) ([place]) 09/05/2011  ? Osteoporosis 03/01/2010  ? TOBACCO ABUSE 11/14/2008  ? Dyslipidemia 05/18/2007  ? Essential hypertension 05/18/2007  ? ?ONSET DATE: 10/23/21 DOS, 10/03/21 DOI ?  ?REFERRING DIAG: S62.619D (ICD-10-CM) - Multiple closed fractures of proximal phalanx of finger with routine healing ? ?THERAPY  DIAG:  ?No diagnosis found. ? ? ?PERTINENT HISTORY: Per MD: "Status post screw fixation of left index, middle, ring, small fingers.  Early mobilization per [place] protocol for screws/plates." ? ?PRECAUTIONS: 8 weeks out from sx now ? ?SUBJECTIVE:  ?*** ? ?She states doing better, using hand more at home now. She also states not doing any HEP today yet.  ? ?PAIN:  ?Are you having pain? Not at rest today *** ?Rating: 0/10 soreness coming in today, just stiff  ? ?OBJECTIVE: (All objective assessments below are from initial evaluation on: 11/05/21 unless otherwise specified.)  ? ? ?HAND DOMINANCE: Right ?  ?ADLs: ?Overall ADLs: She  states the things that bothers her the most are opening containers, purse, carrying things, etc.  ?  ?FUNCTIONAL OUTCOME MEASURES: ?Quick Dash: 70% impairment (at eval) ? ?12/09/21: 20% impairment today ?  ?UE ROM   2.8cm gap from pad thumb to pad SF, cannot make fist, can abd/add slightly (~10*). MF is most sore/painful and was taken for example of all stiff fingers. TAM in Lt MF is 29* today.  ? ?11/25/21: 0cm gap from pad thumb to SF now; 6.4cm gap from MF to full fist today. MF still most stiff ? ?12/09/21: 5.8cm gap from pad MF to [facility]- continues to improve  ?  ?A/ROM Left ?11/05/21 Left  ?11/25/21 Left  ?12/09/21  ?Thumb MCP (0-60)      ?Thumb IP (0-80)      ?Thumb Radial abd/add (0-55)      ?Thumb Palmar abd/add (0-45)      ?Thumb opposition to small finger      ?Index MCP (0-90)      ?Index PIP (0-100)      ?Index DIP (0-70)      ?Long MCP (0-90)  (+10)- 0  (+5) -35 0 - 39  ?Long PIP (0-100)  (-39) - 54  (-40) -60 (-39) - 61  ?Long DIP (0-70)  (+5) - 0  (+5) -16 (+5) - 22  ?Ring MCP (0-90)      ?Ring PIP (0-100)      ?Ring DIP (0-70)      ?Little MCP (0-90)      ?Little PIP (0-100)      ?Little DIP (0-70)      ?(Blank rows = not tested)  ?  ?Active ROM Right ?11/05/2021 Left ?11/05/2021 Left ?11/25/21  ?Wrist flexion 65 55 67  ?Wrist extension 55 15 35  ?(Blank rows = not tested) ?  ?  ?UE MMT:   NT at eval but at least 3-/5 MMT  ?  ?  ?HAND FUNCTION: ?12/09/21: Grip strength: Right: 43 lbs; Left: 6 lbs ?  ?COORDINATION: ?11/25/21: Box and Blocks: Left 29 11/25/21 (WFL is 61)  ? ?12/09/21: 9 Hole Peg test: Right: 26 sec; Left: 43 sec ?  ?SENSATION: ?Intact but decreased LT to all fingers, but states feels some paresthesia  ?  ?EDEMA: diffuse through fingers, hand, wrist lt side  ?  ?COGNITION: ?11/05/21 initial eval: Overall cognitive status: Difficulty to assess and she was somewhat tangential, apparent memory issues and somewhat impulsive. She made some strange comments today as well. ?Areas of impairment: Memory: Deficits  moderate to severe ?Awareness: Deficits moderate lack of safety awareness ?Problem solving: Deficits needs substantial cues and assist ?Behavior: Impulsive & easily flustered when confused ? ?12/09/21: General cognition seems a bit better now, memory still an issue but less impulsive and frustrated lately. Better focus in therapy sessions  ? ?12/12/21: She is  somewhat vacuous today and makes some inappropriate sexual jokes toward therapist.  She also mentions not wanting to tell her PCP about her fractures because "he will think I'm crazy." OT suggests being open/honest with her doctor.  ?  ? ?OBSERVATIONS: 12/09/21: She has had a better attitude, been more focused in therapy, has been still forgetful at times, but no significant sings of safety issues right now.  ? ?12/02/21: she has been more engaged in therapy, less frustration, but continues to have apparent memory issues with directions given out in session and week-to-week. ?  ?  ?TODAY'S TREATMENT:  ?12/17/21: *** ? ?12/12/21: She reviews all HEP with OT, performing each with 15 second holds for stretches and numerous verbal and physical cues. The entire session was spent on this review and addition of last 3 putty exercises.  She continues to need many and frequent cues for poor recall/memory and non- compliance with HEP.  ? ?Exercises ?- Wrist Prayer Stretch  - 2-3 x daily - 3-5 reps - 15 sec hold ?- Seated Finger Composite Flexion Stretch  - 4-6 x daily - 3-5 reps - 15 hold ?- PUSH KNUCKLES DOWN  - 4-6 x daily - 5 reps - 15 seconds hold ?- Full Fist  - 2-3 x daily - 5 reps ?- Seated Claw Fist with Putty  - 2-3 x daily - 5 reps ?- "Duck Mouth" Strength  - 2-3 x daily - 5 reps ?- Finger Extension "Pizza!"   - 2-3 x daily - 5 reps ?- Finger Key Grip with Putty  - 2-3 x daily - 5 reps ?- Thumb Press  - 2-3 x daily - 5 reps ?- Thumb Opposition with Putty  - 2-3 x daily - 5 reps ?- Finger Pinch and Pull with Putty  - 2-3 x daily - 5 reps ?- Cutting Putty  - 2-3 x  daily - 5 reps ?- Finger Spread  - 2-3 x daily - 5 reps ? ?  ?PATIENT EDUCATION: ?Education details: see tx section above for details  ?Person educated: Patient ?Education method: Explanation, Demonstration, an

## 2021-12-18 ENCOUNTER — Telehealth: Payer: Self-pay | Admitting: Rehabilitative and Restorative Service Providers"

## 2021-12-18 NOTE — Telephone Encounter (Signed)
OT called and got a hold of her today. Let her know that I contacted her doctor about concern for poor memory and home safety. She states being relieved, as she thought her sister or friend had been contacting her doctor and it caused some social problems for her. Again, OT apologizes but also states continued worry about memory, etc.  She states she will continue hand therapy tomorrow's appointment and that she is not upset about OT contacting her doctor. OT may try performing SLUMS assessment with her as she is willing to try it to help clear up possible cognitive issues.  ?

## 2021-12-19 ENCOUNTER — Ambulatory Visit (INDEPENDENT_AMBULATORY_CARE_PROVIDER_SITE_OTHER): Payer: Medicare Other | Admitting: Rehabilitative and Restorative Service Providers"

## 2021-12-19 DIAGNOSIS — M25642 Stiffness of left hand, not elsewhere classified: Secondary | ICD-10-CM | POA: Diagnosis not present

## 2021-12-19 DIAGNOSIS — M79642 Pain in left hand: Secondary | ICD-10-CM

## 2021-12-19 DIAGNOSIS — R600 Localized edema: Secondary | ICD-10-CM

## 2021-12-19 DIAGNOSIS — M6281 Muscle weakness (generalized): Secondary | ICD-10-CM | POA: Diagnosis not present

## 2021-12-19 NOTE — Therapy (Signed)
?OUTPATIENT OCCUPATIONAL THERAPY TREATMENT NOTE ? ? ?Patient Name: [name] [name] ?MRN: [id number] ?DOB:Mar 04, 1951, 71 y.o., female ?Today's Date: 12/19/2021 ? ?PCP: Colon Branch, MD ?REFERRING PROVIDER: Sherilyn Cooter, MD ? ? ? ?  ? OT End of Session - 12/19/21 1428   ? ? Visit Number 12   ? Number of Visits 14   ? Date for OT Re-Evaluation 12/27/21   ? Authorization Type Medicare   ? Progress Note Due on Visit 20   ? OT Start Time 1429   ? OT Stop Time 1508   ? OT Time Calculation (min) 39 min   ? Activity Tolerance Patient tolerated treatment well;No increased pain;Patient limited by fatigue   ? Behavior During Therapy [facility] for tasks assessed/performed   behaviors better today, less apparent memory issues, but still noted when asking for teach-back  ? ?  ?  ? ?  ? ? ? ?Past Medical History:  ?Diagnosis Date  ? Anemia   ? duering cancer treatment   ? Anxiety   ? Arthritis   ? OA hands   ? Bilateral lower extremity pain   ? Blood transfusion without reported diagnosis   ? during chemo for lung cancer  ? Cataract   ? forming  but very small   ? Cholelithiasis   ? Colon polyps   ? adenomatous  ? Concussion 08/25/2021  ? [facility])   ? High cholesterol   ? History of chemotherapy   ? for lung cancer- completed 2013  ? Hx of radiation therapy 10/07/11 to 11/20/11  ? right lung  ? Hx of radiation therapy 01/07/2012- 01/21/12  ? cranial irradiation  ? Hypertension   ? Lung cancer ([place]) 09/19/2011  ? Stage III currently in remission  ? Malignant melanoma of skin of canthus of right eye ([place]) 02/2017  ? Osteoporosis 04/2014  ? Dexa scan by Dr. Corinna Capra, T-score -2.7, next in 2 years, with at least 13 % major fracture in 10 years  ? Pitting edema   ? Bilateral lower extremities, duplex ultrasound 04/03/2015 normal, no DVT  ? ?Past Surgical History:  ?Procedure Laterality Date  ? CHOLECYSTECTOMY  04/2011  ? biliary stent placement  ? CLOSED REDUCTION FINGER WITH PERCUTANEOUS PINNING Left 10/23/2021  ? Procedure: LEFT  INDEX, MIDDLE, RING, AND SMALL FINGER CLOSED REDUCTION FINGER WITH PERCUTANEOUS PINNING;  Surgeon: Sherilyn Cooter, MD;  Location: [place];  Service: Orthopedics;  Laterality: Left;  ? COLONOSCOPY    ? [place] REPAIR  2015  ? MOHS SURGERY Right 02/2017  ? cheek and eyelid  ? Josph Macho Touch    ? Vaginal procedure  ? POLYPECTOMY    ? TUBAL LIGATION    ? ?Patient Active Problem List  ? Diagnosis Date Noted  ? Multiple closed fractures of proximal phalanx of finger 10/08/2021  ? EtOH dependence ([place]) 09/11/2021  ? Vitamin B 12 deficiency 09/11/2021  ? Post-traumatic headache 09/11/2021  ? Insomnia 09/11/2021  ? Post concussion syndrome 08/30/2021  ? Benign essential HTN   ? Hyperglycemia   ? Urinary incontinence   ? Pelvic pain   ? Closed pelvic fracture ([place]) 08/03/2020  ? Closed fracture of left superior pubic ramus ([place]) 07/30/2020  ? Left hip pain 07/30/2020  ? GAD (generalized anxiety disorder) 07/30/2020  ? DJD (degenerative joint disease) 09/20/2019  ? Familial hyperlipidemia 07/01/2018  ? Right shoulder pain 07/14/2017  ? Malignant melanoma of skin of canthus of right eye ([place]) 03/16/2017  ? PCP  NOTES >>>>>>>>>>>>>>> 05/22/2015  ? High coronary artery calcium score 02/14/2015  ? Hernia, abdominal 04/01/2013  ? Depression with anxiety 04/01/2012  ? Malignant neoplasm of upper lobe, bronchus or lung 09/30/2011  ? H/O: lung cancer 09/25/2011  ? Annual physical exam 09/05/2011  ? IBS -diarrhea predominant 09/05/2011  ? COPD (chronic obstructive pulmonary disease) ([place]) 09/05/2011  ? Osteoporosis 03/01/2010  ? TOBACCO ABUSE 11/14/2008  ? Dyslipidemia 05/18/2007  ? Essential hypertension 05/18/2007  ? ?ONSET DATE: 10/23/21 DOS, 10/03/21 DOI ?  ?REFERRING DIAG: S62.619D (ICD-10-CM) - Multiple closed fractures of proximal phalanx of finger with routine healing ? ?THERAPY DIAG:  ?Pain in left hand ? ?Stiffness of left hand, not elsewhere classified ? ?Localized edema ? ?Muscle weakness  (generalized) ? ? ?PERTINENT HISTORY: Per MD: "Status post screw fixation of left index, middle, ring, small fingers.  Early mobilization per [place] protocol for screws/plates." ? ?PRECAUTIONS: 8 weeks out from sx now ? ?SUBJECTIVE:  ?She arrives with her sister, but thy are both somewhat upset. Her sister leaves to stay in the car, and Anasia asks for OT to not inform her sister of any treatment/conversation today. She has previously been fine with her sister being present and involved with her therapy and medical advice/instruction. She states that Dr. Larose Kells gave her a cognitive test and she did not do well. She speaks somewhat emotionally and irrationally today (as she has done at times in the past), at one point, states she "thought about killing her sister or the therapist" (myself), however she states no plan, time frame, no specifics, and doesn't seem to have serious ill intent so OT does not feel personally threatened. She is teary eyed.  ? ?PAIN:  ?Are you having pain? Not at rest today  ?Rating: 0/10 soreness coming in today, just stiff  ? ?OBJECTIVE: (All objective assessments below are from initial evaluation on: 11/05/21 unless otherwise specified.)  ? ? ?HAND DOMINANCE: Right ?  ?ADLs: ?Overall ADLs: She states the things that bothers her the most are opening containers, purse, carrying things, etc.  ?  ?FUNCTIONAL OUTCOME MEASURES: ?Quick Dash: 70% impairment (at eval) ? ?12/09/21: 20% impairment today ?  ?UE ROM   2.8cm gap from pad thumb to pad SF, cannot make fist, can abd/add slightly (~10*). MF is most sore/painful and was taken for example of all stiff fingers. TAM in Lt MF is 29* today.  ? ?11/25/21: 0cm gap from pad thumb to SF now; 6.4cm gap from MF to full fist today. MF still most stiff ? ?12/09/21: 5.8cm gap from pad MF to [facility]- continues to improve  ?  ?A/ROM Left ?11/05/21 Left  ?11/25/21 Left  ?12/09/21  ?Thumb MCP (0-60)      ?Thumb IP (0-80)      ?Thumb Radial abd/add (0-55)      ?Thumb  Palmar abd/add (0-45)      ?Thumb opposition to small finger      ?Index MCP (0-90)      ?Index PIP (0-100)      ?Index DIP (0-70)      ?Long MCP (0-90)  (+10)- 0  (+5) -35 0 - 39  ?Long PIP (0-100)  (-39) - 54  (-40) -60 (-39) - 61  ?Long DIP (0-70)  (+5) - 0  (+5) -16 (+5) - 22  ?Ring MCP (0-90)      ?Ring PIP (0-100)      ?Ring DIP (0-70)      ?Little MCP (0-90)      ?  Little PIP (0-100)      ?Little DIP (0-70)      ?(Blank rows = not tested)  ?  ?Active ROM Right ?11/05/2021 Left ?11/05/2021 Left ?11/25/21  ?Wrist flexion 65 55 67  ?Wrist extension 55 15 35  ?(Blank rows = not tested) ?  ?  ?UE MMT:   NT at eval but at least 3-/5 MMT  ?  ?  ?HAND FUNCTION: ?12/09/21: Grip strength: Right: 43 lbs; Left: 6 lbs ?  ?COORDINATION: ?11/25/21: Box and Blocks: Left 29 11/25/21 (WFL is 61)  ? ?12/09/21: 9 Hole Peg test: Right: 26 sec; Left: 43 sec ?  ?SENSATION: ?Intact but decreased LT to all fingers, but states feels some paresthesia  ?  ?EDEMA: diffuse through fingers, hand, wrist lt side  ?  ?COGNITION: ?12/19/21: She is given cognitive test, SLUMS, scores 19, which is "Dementia" level, and OT discusses this with her, discusses home safety, etc. See tx section below.  ? ?12/12/21: She is somewhat vacuous today and makes some inappropriate sexual jokes toward therapist.  She also mentions not wanting to tell her PCP about her fractures because "he will think I'm crazy." OT suggests being open/honest with her doctor.  ? ?12/09/21: General cognition seems a bit better now, memory still an issue but less impulsive and frustrated lately. Better focus in therapy sessions  ? ?11/05/21 initial eval: Overall cognitive status: Difficulty to assess and she was somewhat tangential, apparent memory issues and somewhat impulsive. She made some strange comments today as well. ?Areas of impairment: Memory: Deficits moderate to severe ?Awareness: Deficits moderate lack of safety awareness ?Problem solving: Deficits needs substantial cues and  assist ?Behavior: Impulsive & easily flustered when confused ?  ? ?OBSERVATIONS: 12/09/21: She has had a better attitude, been more focused in therapy, has been still forgetful at times, but no significant sings of safety

## 2021-12-24 ENCOUNTER — Encounter: Payer: Self-pay | Admitting: Rehabilitative and Restorative Service Providers"

## 2021-12-24 ENCOUNTER — Telehealth: Payer: Self-pay | Admitting: Internal Medicine

## 2021-12-24 ENCOUNTER — Ambulatory Visit (INDEPENDENT_AMBULATORY_CARE_PROVIDER_SITE_OTHER): Payer: Medicare Other | Admitting: Rehabilitative and Restorative Service Providers"

## 2021-12-24 ENCOUNTER — Telehealth: Payer: Self-pay | Admitting: Rehabilitative and Restorative Service Providers"

## 2021-12-24 DIAGNOSIS — M25642 Stiffness of left hand, not elsewhere classified: Secondary | ICD-10-CM

## 2021-12-24 DIAGNOSIS — M6281 Muscle weakness (generalized): Secondary | ICD-10-CM | POA: Diagnosis not present

## 2021-12-24 DIAGNOSIS — M79642 Pain in left hand: Secondary | ICD-10-CM | POA: Diagnosis not present

## 2021-12-24 DIAGNOSIS — R600 Localized edema: Secondary | ICD-10-CM | POA: Diagnosis not present

## 2021-12-24 NOTE — Telephone Encounter (Signed)
Out of concern for the patient, I spoke to her sister (also per MD verbal directions) and warned her that her sister had recently been verbalizing vague homicidal comments toward therapy and about herself (sister) and mentions having "no joy left in her life." Due to [place] not wanting to seek help/treatment for these things, the best that can be done is to stay close to her, watch her if she will allow it and call 911 or emergency mental health number [phone]) if there is any mental health or physical emergency noted. She states understanding and thanks the therapist for the information.  ? ?She is also informed that Debbe is now discharged from occupational therapy and there is no need to come in for her Thursday appointment.  ?

## 2021-12-24 NOTE — Telephone Encounter (Signed)
Called [name] [name]'s PCP office about continued worry for her mental status and also recent homicidal comments/jokes in therapy sessions. She has commented/joked about killing her sister and myself (rehab therapist) on 2 occasions now, but not stated a method, time, etc. She is on anti-depressants as well (concerns for depression/self-injury). He advised me to stop seeing her if I am worried about my safety and to also contact her sister to caution her to be on alert for suicidal/homicidal behaviors. My has signed medical release forms allowing [place] to share info with her sister (this is in the EMR).  ? ?I also contacted the [facility] and spoke to a counselor/assessor who suggested talking with her sister, providing emergency numbers and cautioning her to be on alert as well.   ?

## 2021-12-24 NOTE — Telephone Encounter (Signed)
I spoke with Ovid Curd, OT today, plan to call Raelynn's  sister tomorrow ?

## 2021-12-24 NOTE — Therapy (Addendum)
?OUTPATIENT OCCUPATIONAL THERAPY TREATMENT & DISCHARGE NOTE ? ? ?Patient Name: [name] [name] ?MRN: [id number] ?DOB:April 14, 1951, 71 y.o., female ?Today's Date: 12/24/2021 ? ?PCP: Colon Branch, MD ?REFERRING PROVIDER: Sherilyn Cooter, MD ? ? ? ?  ? OT End of Session - 12/24/21 1416   ? ? Visit Number 13   ? Number of Visits 14   ? Date for OT Re-Evaluation 12/27/21   ? Authorization Type Medicare   ? Progress Note Due on Visit 20   ? OT Start Time 1417   ? OT Stop Time 1457   ? OT Time Calculation (min) 40 min   ? Activity Tolerance Patient tolerated treatment well;No increased pain;Patient limited by fatigue   ? Behavior During Therapy [facility] for tasks assessed/performed   behaviors better today, less apparent memory issues, but still noted when asking for teach-back  ? ?  ?  ? ?  ? ? ? ? ?Past Medical History:  ?Diagnosis Date  ? Anemia   ? duering cancer treatment   ? Anxiety   ? Arthritis   ? OA hands   ? Bilateral lower extremity pain   ? Blood transfusion without reported diagnosis   ? during chemo for lung cancer  ? Cataract   ? forming  but very small   ? Cholelithiasis   ? Colon polyps   ? adenomatous  ? Concussion 08/25/2021  ? ETOHdependence [facility])   ? High cholesterol   ? History of chemotherapy   ? for lung cancer- completed 2013  ? Hx of radiation therapy 10/07/11 to 11/20/11  ? right lung  ? Hx of radiation therapy 01/07/2012- 01/21/12  ? cranial irradiation  ? Hypertension   ? Lung cancer ([place]) 09/19/2011  ? Stage III currently in remission  ? Malignant melanoma of skin of canthus of right eye ([place]) 02/2017  ? Osteoporosis 04/2014  ? Dexa scan by Dr. Corinna Capra, T-score -2.7, next in 2 years, with at least 13 % major fracture in 10 years  ? Pitting edema   ? Bilateral lower extremities, duplex ultrasound 04/03/2015 normal, no DVT  ? ?Past Surgical History:  ?Procedure Laterality Date  ? CHOLECYSTECTOMY  04/2011  ? biliary stent placement  ? CLOSED REDUCTION FINGER WITH PERCUTANEOUS PINNING Left 10/23/2021  ?  Procedure: LEFT INDEX, MIDDLE, RING, AND SMALL FINGER CLOSED REDUCTION FINGER WITH PERCUTANEOUS PINNING;  Surgeon: Sherilyn Cooter, MD;  Location: [place];  Service: Orthopedics;  Laterality: Left;  ? COLONOSCOPY    ? [place] REPAIR  2015  ? MOHS SURGERY Right 02/2017  ? cheek and eyelid  ? Josph Macho Touch    ? Vaginal procedure  ? POLYPECTOMY    ? TUBAL LIGATION    ? ?Patient Active Problem List  ? Diagnosis Date Noted  ? Multiple closed fractures of proximal phalanx of finger 10/08/2021  ? EtOH dependence ([place]) 09/11/2021  ? Vitamin B 12 deficiency 09/11/2021  ? Post-traumatic headache 09/11/2021  ? Insomnia 09/11/2021  ? Post concussion syndrome 08/30/2021  ? Benign essential HTN   ? Hyperglycemia   ? Urinary incontinence   ? Pelvic pain   ? Closed pelvic fracture ([place]) 08/03/2020  ? Closed fracture of left superior pubic ramus ([place]) 07/30/2020  ? Left hip pain 07/30/2020  ? GAD (generalized anxiety disorder) 07/30/2020  ? DJD (degenerative joint disease) 09/20/2019  ? Familial hyperlipidemia 07/01/2018  ? Right shoulder pain 07/14/2017  ? Malignant melanoma of skin of canthus of right eye ([place]) 03/16/2017  ?  PCP NOTES >>>>>>>>>>>>>>> 05/22/2015  ? High coronary artery calcium score 02/14/2015  ? Hernia, abdominal 04/01/2013  ? Depression with anxiety 04/01/2012  ? Malignant neoplasm of upper lobe, bronchus or lung 09/30/2011  ? H/O: lung cancer 09/25/2011  ? Annual physical exam 09/05/2011  ? IBS -diarrhea predominant 09/05/2011  ? COPD (chronic obstructive pulmonary disease) ([place]) 09/05/2011  ? Osteoporosis 03/01/2010  ? TOBACCO ABUSE 11/14/2008  ? Dyslipidemia 05/18/2007  ? Essential hypertension 05/18/2007  ? ?ONSET DATE: 10/23/21 DOS, 10/03/21 DOI ?  ?REFERRING DIAG: S62.619D (ICD-10-CM) - Multiple closed fractures of proximal phalanx of finger with routine healing ? ?THERAPY DIAG:  ?Stiffness of left hand, not elsewhere classified ? ?Localized edema ? ?Pain in left hand ? ?Muscle  weakness (generalized) ? ? ?PERTINENT HISTORY: Per MD: "Status post screw fixation of left index, middle, ring, small fingers.  Early mobilization per [place] protocol for screws/plates." ? ?PRECAUTIONS: 9 weeks out from sx now ? ?SUBJECTIVE:  ?She states that OT "sucked the joy" out of her life by insinuating that she had dementia.  She again makes inappropriate jokes about wanting to hurt the therapist. She states, "maybe you should tell your wife about me, incase you go missing one day," and laughs. She is still not allowing sister into sessions, but she did have her sister bring her to therapy (drive) today. OT also clarifies that I am not a specialist in dementia, and I did not diagnose her (though her MD did document "moderate dementia" scoring from MMSE)- I just identified obvious areas of safety concerns and am recommending she go to a specialist.  OT continues to urge her to see a neurologist or other dementia specialist to help her situation, not to harm or shame her.  ? ? ?PAIN:  ?Are you having pain? Not at rest today  ?Rating: 0/10 soreness coming in today, just stiff  ? ?OBJECTIVE: (All objective assessments below are from initial evaluation on: 11/05/21 unless otherwise specified.)  ? ? ?HAND DOMINANCE: Right ?  ?ADLs: ?Overall ADLs: She states the things that bothers her the most are opening containers, purse, carrying things, etc.  ?  ?FUNCTIONAL OUTCOME MEASURES: ?Quick Dash: 70% impairment (at eval) ? ?12/09/21: 20% impairment today ?  ?UE ROM   2.8cm gap from pad thumb to pad SF, cannot make fist, can abd/add slightly (~10*). MF is most sore/painful and was taken for example of all stiff fingers. TAM in Lt MF is 29* today.  ? ?11/25/21: 0cm gap from pad thumb to SF now; 6.4cm gap from MF to full fist today. MF still most stiff ? ?12/09/21: 5.8cm gap from pad MF to [facility]- continues to improve  ?  ?A/ROM Left ?11/05/21 Left  ?11/25/21 Left  ?12/09/21  ?Thumb MCP (0-60)      ?Thumb IP (0-80)      ?Thumb Radial  abd/add (0-55)      ?Thumb Palmar abd/add (0-45)      ?Thumb opposition to small finger      ?Index MCP (0-90)      ?Index PIP (0-100)      ?Index DIP (0-70)      ?Long MCP (0-90)  (+10)- 0  (+5) -35 0 - 39  ?Long PIP (0-100)  (-39) - 54  (-40) -60 (-39) - 61  ?Long DIP (0-70)  (+5) - 0  (+5) -16 (+5) - 22  ?Ring MCP (0-90)      ?Ring PIP (0-100)      ?Ring DIP (0-70)      ?  Little MCP (0-90)      ?Little PIP (0-100)      ?Little DIP (0-70)      ?(Blank rows = not tested)  ?  ?Active ROM Right ?11/05/2021 Left ?11/05/2021 Left ?11/25/21  ?Wrist flexion 65 55 67  ?Wrist extension 55 15 35  ?(Blank rows = not tested) ?  ?  ?UE MMT:   NT at eval but at least 3-/5 MMT  ?  ?  ?HAND FUNCTION: ?12/09/21: Grip strength: Right: 43 lbs; Left: 6 lbs ?  ?COORDINATION: ?11/25/21: Box and Blocks: Left 29 11/25/21 (WFL is 61)  ? ?12/09/21: 9 Hole Peg test: Right: 26 sec; Left: 43 sec ?  ?SENSATION: ?Intact but decreased LT to all fingers, but states feels some paresthesia  ?  ?EDEMA: diffuse through fingers, hand, wrist lt side  ?  ?COGNITION: ?12/24/21: continue to have overt confusion with simple directions, says inappropriate, threatening jokes to OT.  ? ? ?OBSERVATIONS: 12/09/21: She has had a better attitude, been more focused in therapy, has been still forgetful at times, but no significant sings of safety issues right now.  ? ?12/02/21: she has been more engaged in therapy, less frustration, but continues to have apparent memory issues with directions given out in session and week-to-week. ?  ?  ?TODAY'S TREATMENT:  ?12/24/21: OT does manual stretches to each joint and finger of Lt hand, light joint mobs and dorsal taping is performed for edu for light static prolonged stretches she can do at home. She is given a roll of tape. She needs some extra demonstration and repeat instruction. She also wants to review some of her HEP exercises, and demo's some issues with finger flexion stretches. OT reviews these and she seems to do better during  the session. She is also given new written instruction for MCP J isolation flexion stretches and is told to add to HEP. She has still not fully mastered HEP after many weeks (confusion, memory issues) and c

## 2021-12-25 NOTE — Telephone Encounter (Signed)
I spoke with [name], the patient's sister, we had a long conversation. ?She confirms that she drinks much more than 2 glasses of alcohol daily   that she has make remarks about killing people but she does not think she is serious or credible. ?Also, she has noticed memory issues and confusion mostly when she drinks. ?Fortunately, she tells me that the patient does not drink and drive ?My advice was to reach out for help at the behavioral hospital, contact number provided. ?If she has serious aggressive behavior or confusion then call 911. ?Alcoholic Anonymous is another source for help. ?She verbalized understanding ?Advised to call this office if we can do something else for her. ? ?Find Help 24/7, [place] ?Call our 24-hour HelpLine at [phone]   ?  ?

## 2021-12-26 ENCOUNTER — Encounter: Payer: Medicare Other | Admitting: Rehabilitative and Restorative Service Providers"

## 2021-12-28 DIAGNOSIS — Z20822 Contact with and (suspected) exposure to covid-19: Secondary | ICD-10-CM | POA: Diagnosis not present

## 2021-12-30 ENCOUNTER — Other Ambulatory Visit: Payer: Self-pay | Admitting: Internal Medicine

## 2021-12-30 DIAGNOSIS — Z20822 Contact with and (suspected) exposure to covid-19: Secondary | ICD-10-CM | POA: Diagnosis not present

## 2021-12-30 DIAGNOSIS — E785 Hyperlipidemia, unspecified: Secondary | ICD-10-CM

## 2022-01-25 NOTE — Progress Notes (Signed)
[place] HEMATOLOGY-ONCOLOGY TeleHEALTH VISIT PROGRESS NOTE   I connected with Daralyn C Maddy on 01/30/22 at  8:00 AM EDT by telephone and verified that I am speaking with the correct person using two identifiers.  I discussed the limitations, risks, security and privacy concerns of performing an evaluation and management service by telemedicine and the availability of in-person appointments. I also discussed with the patient that there may be a patient responsible charge related to this service. The patient expressed understanding and agreed to proceed.  Other persons participating in the visit and their role in the encounter: None  Patient's location: Home  Provider's location: [place], Alda Berthold, MD [address] [place] [address] [place] [address]  DIAGNOSIS: Limited stage small cell lung cancer diagnosed in January of 2013   PRIOR THERAPY: 1) Systemic chemotherapy with cisplatin at 60 mg per meter square given on day 1 and etoposide at 120 mg per meter square given on days one 2 and 3 with Neulasta support given on day 4 status post 4 cycles, last cycle was given on 12/02/2011 . This with concurrent with radiotherapy under the care of Dr. Lisbeth Renshaw.  2) prophylactic cranial irradiation under the care of Dr. Lisbeth Renshaw completed on 01/21/2012   CURRENT THERAPY: Observation  INTERVAL HISTORY: [name] [name] 71 y.o. female and I connected via telephone visit today. The patient is feeling well today without any concerning complaints except she broke her hand and is having contractures. She has performed therapy twice. The patient had been on observation since 2013. Denies any fever, chills, night sweats, or weight loss. Denies any chest pain, shortness of breath, or hemoptysis. Denies significant cough. Denies any nausea, vomiting, diarrhea, or constipation. Denies any headache or visual changes except for some vision changes secondary to being due to go to the eye  doctor.  The patient recently had a restaging CT scan performed. The purpose of the visit today is for evaluation and to review her scan results.    MEDICAL HISTORY: Past Medical History:  Diagnosis Date   Anemia    duering cancer treatment    Anxiety    Arthritis    OA hands    Bilateral lower extremity pain    Blood transfusion without reported diagnosis    during chemo for lung cancer   Cataract    forming  but very small    Cholelithiasis    Colon polyps    adenomatous   Concussion 08/25/2021   ETOHdependence ([place])    High cholesterol    History of chemotherapy    for lung cancer- completed 2013   Hx of radiation therapy 10/07/11 to 11/20/11   right lung   Hx of radiation therapy 01/07/2012- 01/21/12   cranial irradiation   Hypertension    Lung cancer ([place]) 09/19/2011   Stage III currently in remission   Malignant melanoma of skin of canthus of right eye ([place]) 02/2017   Osteoporosis 04/2014   Dexa scan by Dr. Corinna Capra, T-score -2.7, next in 2 years, with at least 13 % major fracture in 10 years   Pitting edema    Bilateral lower extremities, duplex ultrasound 04/03/2015 normal, no DVT    ALLERGIES:  is allergic to tramadol hcl, codeine, and penicillins.  MEDICATIONS:  Current Outpatient Medications  Medication Sig Dispense Refill   acetaminophen (TYLENOL) 500 MG tablet Take 500 mg by mouth every 6 (six) hours as needed. (Patient not taking: Reported on 12/16/2021)  aspirin EC 81 MG tablet Take 81 mg daily by mouth.     carvedilol (COREG) 6.25 MG tablet Take 1 tablet (6.25 mg total) by mouth 2 (two) times daily with a meal. 180 tablet 1   Cholecalciferol (VITAMIN D3) 2000 UNITS capsule Take 4,000 Units by mouth daily.  (Patient not taking: Reported on 11/05/2021)     citalopram (CELEXA) 20 MG tablet Take 1 tablet (20 mg total) by mouth daily. 90 tablet 1   cyanocobalamin 1000 MCG tablet Take 2 tablets by mouth daily. (Patient not taking: Reported on 12/16/2021) 60 tablet 0    folic acid (FOLVITE) 1 MG tablet Take 1 tablet (1 mg total) by mouth daily. (Patient not taking: Reported on 11/05/2021) 30 tablet 0   hydrOXYzine (ATARAX) 10 MG tablet Take 1-2 tablets (10-20 mg total) by mouth at bedtime as needed. 60 tablet 0   raloxifene (EVISTA) 60 MG tablet Take 1 tablet (60 mg total) by mouth daily. 90 tablet 3   REPATHA SURECLICK 024 MG/ML SOAJ INJECT 140 MG( 1 ML) UNDER THE SKIN EVERY 14 DAYS (Patient taking differently: Inject 140 mg into the skin every 14 (fourteen) days.) 2 mL 11   rosuvastatin (CRESTOR) 40 MG tablet TAKE 1 TABLET(40 MG) BY MOUTH AT BEDTIME 90 tablet 1   thiamine 100 MG tablet Take 1 tablet (100 mg total) by mouth daily. (Patient not taking: Reported on 11/05/2021) 30 tablet 0   No current facility-administered medications for this visit.    SURGICAL HISTORY:  Past Surgical History:  Procedure Laterality Date   CHOLECYSTECTOMY  04/2011   biliary stent placement   CLOSED REDUCTION FINGER WITH PERCUTANEOUS PINNING Left 10/23/2021   Procedure: LEFT INDEX, MIDDLE, RING, AND SMALL FINGER CLOSED REDUCTION FINGER WITH PERCUTANEOUS PINNING;  Surgeon: Sherilyn Cooter, MD;  Location: [place];  Service: Orthopedics;  Laterality: Left;   COLONOSCOPY     INCISIONAL HERNIA REPAIR  2015   MOHS SURGERY Right 02/2017   cheek and eyelid   Josph Macho Touch     Vaginal procedure   POLYPECTOMY     TUBAL LIGATION      REVIEW OF SYSTEMS:   Review of Systems  Constitutional: Negative for appetite change, chills, fatigue, fever and unexpected weight change.  HENT: Negative for mouth sores, nosebleeds, sore throat and trouble swallowing.   Eyes: Negative for eye problems and icterus.  Respiratory: Negative for cough, hemoptysis, shortness of breath and wheezing.   Cardiovascular: Negative for chest pain and leg swelling.  Gastrointestinal: Negative for abdominal pain, constipation, diarrhea, nausea and vomiting.  Genitourinary: Negative for bladder  incontinence, difficulty urinating, dysuria, frequency and hematuria.   Musculoskeletal: Positive for hand pain. Negative for back pain, gait problem, neck pain and neck stiffness.  Skin: Negative for itching and rash.  Neurological: Negative for dizziness, extremity weakness, gait problem, headaches, light-headedness and seizures.  Hematological: Negative for adenopathy. Does not bruise/bleed easily.  Psychiatric/Behavioral: Negative for confusion, depression and sleep disturbance. The patient is not nervous/anxious.     PHYSICAL EXAMINATION:  There were no vitals taken for this visit.  ECOG PERFORMANCE STATUS: 1  Physical Exam  Constitutional: Oriented to person, place, and time and well-developed, well-nourished, and in no distress.  Psychiatric: Mood, memory and judgment normal.  Vitals reviewed.  LABORATORY DATA: Lab Results  Component Value Date   WBC 6.8 01/28/2022   HGB 12.9 01/28/2022   HCT 38.1 01/28/2022   MCV 94.8 01/28/2022   PLT 227 01/28/2022  Chemistry      Component Value Date/Time   NA 139 01/28/2022 1434   NA 143 02/05/2017 0817   K 3.6 01/28/2022 1434   K 4.3 02/05/2017 0817   CL 104 01/28/2022 1434   CL 104 12/23/2012 1338   CO2 26 01/28/2022 1434   CO2 25 02/05/2017 0817   BUN 13 01/28/2022 1434   BUN 16.0 02/05/2017 0817   CREATININE 0.87 01/28/2022 1434   CREATININE 1.0 02/05/2017 0817      Component Value Date/Time   CALCIUM 9.5 01/28/2022 1434   CALCIUM 9.5 02/05/2017 0817   ALKPHOS 55 01/28/2022 1434   ALKPHOS 48 02/05/2017 0817   AST 23 01/28/2022 1434   AST 21 02/05/2017 0817   ALT 12 01/28/2022 1434   ALT 12 02/05/2017 0817   BILITOT 0.3 01/28/2022 1434   BILITOT 0.31 02/05/2017 0817       RADIOGRAPHIC STUDIES:  CT CHEST WO CONTRAST  Result Date: 01/29/2022 CLINICAL DATA:  Small-cell lung cancer. Restaging. * Tracking Code: BO * EXAM: CT CHEST WITHOUT CONTRAST TECHNIQUE: Multidetector CT imaging of the chest was performed  following the standard protocol without IV contrast. RADIATION DOSE REDUCTION: This exam was performed according to the departmental dose-optimization program which includes automated exposure control, adjustment of the mA and/or kV according to patient size and/or use of iterative reconstruction technique. COMPARISON:  01/29/2021 FINDINGS: Cardiovascular: The heart size is normal. No substantial pericardial effusion. Coronary artery calcification is evident. Mild atherosclerotic calcification is noted in the wall of the thoracic aorta. Mediastinum/Nodes: No mediastinal lymphadenopathy. No evidence for gross hilar lymphadenopathy although assessment is limited by the lack of intravenous contrast on the current study. The esophagus has normal imaging features. There is no axillary lymphadenopathy. Lungs/Pleura: Post treatment scarring in the parahilar right lung is stable in the interval no new suspicious pulmonary nodule or mass. No focal airspace consolidation. No pleural effusion. Upper Abdomen: The liver shows diffusely decreased attenuation suggesting fat deposition. Musculoskeletal: No worrisome lytic or sclerotic osseous abnormality. IMPRESSION: 1. Stable exam. No new or progressive findings to suggest recurrent or metastatic disease. 2. Post treatment scarring in the parahilar right lung. 3. Hepatic steatosis. 4. Aortic Atherosclerosis (ICD10-I70.0). Electronically Signed   By: Misty Stanley M.D.   On: 01/29/2022 16:09      ASSESSMENT/PLAN:  This is a very pleasant 71 years old Caucasian female with diagnosed with limited stage small cell lung cancer in 2013.    She is status post systemic chemotherapy with cisplatin and etoposide concurrent with radiation and followed by prophylactic cranial irradiation completed in May 2013. She has been on observation since that time and the patient is feeling   The patient recently had a restaging CT scan performed. The patient was seen with Dr. Julien Nordmann. Dr.  Julien Nordmann personally and independently reviewed the scan and discussed the results with the patient. The scan did not show any evidence for disease progression.    Dr. Julien Nordmann recommends that the patient continue on observation with a restaging CT scan in 1 year.   Per patient request, we will arrange for her CT to be performed without contrast.   The patient was advised to call immediately if she has any concerning symptoms in the interval. The patient voices understanding of current disease status and treatment options and is in agreement with the current care plan. All questions were answered. The patient knows to call the clinic with any problems, questions or concerns. We can certainly see the patient  much sooner if necessary    I discussed the assessment and treatment plan with the patient. The patient was provided an opportunity to ask questions and all were answered. The patient agreed with the plan and demonstrated an understanding of the instructions.  The patient was advised to call back or seek an in-person evaluation if the symptoms worsen or if the condition fails to improve as anticipated.  I provided 15 minutes of non face-to-face telephone visit time during this encounter, and > 50% was spent counseling as documented under my assessment & plan.  Eugina Row L Jorden Mahl, PA-C 01/30/2022 8:08 AM  No orders of the defined types were placed in this encounter.    Ellwyn Ergle L Vandy Fong, PA-C 01/30/22     No orders of the defined types were placed in this encounter.    Anhthu Perdew L Christan Ciccarelli, PA-C 01/25/22  ADDENDUM: Hematology/Oncology Attending: I contributed to the telephone virtual visit with the patient today.  I reviewed her records, lab, scan and recommended her care plan.  This is a very pleasant 71 years old white female diagnosed with limited stage small cell lung cancer in 2013 status post systemic chemotherapy with cisplatin and etoposide concurrent with  radiation followed by prophylactic cranial irradiation. The patient has been on observation since that time and she is feeling fine with no concerning complaints. Her recent CT scan of the chest showed no concerning findings for disease recurrence or metastasis. I recommended for the patient to continue on observation with repeat CT scan of the chest without contrast in 1 year. The patient was advised to call immediately if she has any other concerning symptoms in the interval. Disclaimer: This note was dictated with voice recognition software. Similar sounding words can inadvertently be transcribed and may be missed upon review. Eilleen Kempf, MD

## 2022-01-28 ENCOUNTER — Inpatient Hospital Stay: Payer: Medicare Other | Attending: Physician Assistant

## 2022-01-28 ENCOUNTER — Other Ambulatory Visit: Payer: Self-pay

## 2022-01-28 ENCOUNTER — Ambulatory Visit (HOSPITAL_COMMUNITY)
Admission: RE | Admit: 2022-01-28 | Discharge: 2022-01-28 | Disposition: A | Discharge status: Home / Self Care | Payer: Medicare Other | Source: Ambulatory Visit | Attending: Physician Assistant | Admitting: Physician Assistant

## 2022-01-28 DIAGNOSIS — I700 Atherosclerosis of aorta: Secondary | ICD-10-CM | POA: Diagnosis not present

## 2022-01-28 DIAGNOSIS — C3490 Malignant neoplasm of unspecified part of unspecified bronchus or lung: Secondary | ICD-10-CM

## 2022-01-28 LAB — CBC WITH DIFFERENTIAL (CANCER CENTER ONLY)
Abs Immature Granulocytes: 0.02 10*3/uL (ref 0.00–0.07)
Basophils Absolute: 0.1 10*3/uL (ref 0.0–0.1)
Basophils Relative: 1 %
Eosinophils Absolute: 0.1 10*3/uL (ref 0.0–0.5)
Eosinophils Relative: 2 %
HCT: 38.1 % (ref 36.0–46.0)
Hemoglobin: 12.9 g/dL (ref 12.0–15.0)
Immature Granulocytes: 0 %
Lymphocytes Relative: 15 %
Lymphs Abs: 1 10*3/uL (ref 0.7–4.0)
MCH: 32.1 pg (ref 26.0–34.0)
MCHC: 33.9 g/dL (ref 30.0–36.0)
MCV: 94.8 fL (ref 80.0–100.0)
Monocytes Absolute: 0.6 10*3/uL (ref 0.1–1.0)
Monocytes Relative: 8 %
Neutro Abs: 5 10*3/uL (ref 1.7–7.7)
Neutrophils Relative %: 74 %
Platelet Count: 227 10*3/uL (ref 150–400)
RBC: 4.02 MIL/uL (ref 3.87–5.11)
RDW: 11.6 % (ref 11.5–15.5)
WBC Count: 6.8 10*3/uL (ref 4.0–10.5)
nRBC: 0 % (ref 0.0–0.2)

## 2022-01-28 LAB — CMP (CANCER CENTER ONLY)
ALT: 12 U/L (ref 0–44)
AST: 23 U/L (ref 15–41)
Albumin: 4.2 g/dL (ref 3.5–5.0)
Alkaline Phosphatase: 55 U/L (ref 38–126)
Anion gap: 9 (ref 5–15)
BUN: 13 mg/dL (ref 8–23)
CO2: 26 mmol/L (ref 22–32)
Calcium: 9.5 mg/dL (ref 8.9–10.3)
Chloride: 104 mmol/L (ref 98–111)
Creatinine: 0.87 mg/dL (ref 0.44–1.00)
GFR, Estimated: >60 mL/min (ref >60)
Glucose, Bld: 140 mg/dL — ABNORMAL HIGH (ref 70–99)
Potassium: 3.6 mmol/L (ref 3.5–5.1)
Sodium: 139 mmol/L (ref 135–145)
Total Bilirubin: 0.3 mg/dL (ref 0.3–1.2)
Total Protein: 6.9 g/dL (ref 6.5–8.1)

## 2022-01-30 ENCOUNTER — Inpatient Hospital Stay (HOSPITAL_BASED_OUTPATIENT_CLINIC_OR_DEPARTMENT_OTHER): Payer: Medicare Other | Admitting: Physician Assistant

## 2022-01-30 ENCOUNTER — Ambulatory Visit: Payer: Medicare Other | Admitting: Physician Assistant

## 2022-01-30 DIAGNOSIS — C801 Malignant (primary) neoplasm, unspecified: Secondary | ICD-10-CM

## 2022-02-19 ENCOUNTER — Telehealth: Payer: Self-pay | Admitting: Internal Medicine

## 2022-02-19 NOTE — Telephone Encounter (Signed)
Scheduled per 06/08 los, patient has been called and notified. 

## 2022-02-27 ENCOUNTER — Other Ambulatory Visit: Payer: Self-pay | Admitting: Internal Medicine

## 2022-03-03 ENCOUNTER — Telehealth: Payer: Self-pay

## 2022-03-03 NOTE — Telephone Encounter (Signed)
Terrez Ander from Dr.Nazziola's office called. Patient is in the chair currently for dental work and they need to know if premed is needed. Patient had finger pinning on 10/23/2021. Spoke with Dr.Benfield and she does not need to be predmedicated with antibiotics before dental work.

## 2022-03-19 ENCOUNTER — Ambulatory Visit (INDEPENDENT_AMBULATORY_CARE_PROVIDER_SITE_OTHER): Payer: Medicare Other | Admitting: Internal Medicine

## 2022-03-19 ENCOUNTER — Encounter: Payer: Self-pay | Admitting: Internal Medicine

## 2022-03-19 VITALS — BP 136/82 | HR 96 | Temp 98.4°F | Resp 16 | Ht 62.0 in | Wt 154.0 lb

## 2022-03-19 DIAGNOSIS — G3184 Mild cognitive impairment, so stated: Secondary | ICD-10-CM | POA: Diagnosis not present

## 2022-03-19 DIAGNOSIS — E538 Deficiency of other specified B group vitamins: Secondary | ICD-10-CM

## 2022-03-19 DIAGNOSIS — F1010 Alcohol abuse, uncomplicated: Secondary | ICD-10-CM

## 2022-03-19 DIAGNOSIS — F1029 Alcohol dependence with unspecified alcohol-induced disorder: Secondary | ICD-10-CM

## 2022-03-19 DIAGNOSIS — E559 Vitamin D deficiency, unspecified: Secondary | ICD-10-CM | POA: Diagnosis not present

## 2022-03-19 DIAGNOSIS — G4700 Insomnia, unspecified: Secondary | ICD-10-CM

## 2022-03-19 NOTE — Patient Instructions (Addendum)
Please drop off or mail [place] a copy of your [place].   Recommend to proceed with the following vaccines at your pharmacy,  Shingrix (shingles) #2  Covid booster (bivalent) Flu shot this fall   We are referring you to a neurologist for evaluation of your memory.   GO TO THE FRONT DESK, PLEASE SCHEDULE YOUR APPOINTMENTS Come back for a checkup in 4 months

## 2022-03-19 NOTE — Assessment & Plan Note (Signed)
Depression anxiety: On citalopram, denies any suicidal or homicidal ideas although she has make some "jokes" about killing her OT, see other notes. Recommend to abstain from such comments   Cont present care Insomnia: She could take Atarax if needed.  Reports that her sister who help with her medicines would not give her Atarax if she knows she drank EtOH that night. MCI: MMSE few months ago was 20, she is   upset about she having "dementia", states that she thinks about it every day.  I explained the patient that memory deficit is a range and she is in the mild side of the range.  I also told her that since she is drinking less than before it is entirely possible that her memory may have improved. Things that she can do to protect her memory are mostly stop etoh completely and take vitamins as recommended (she is not).  She was not completely satisfied with my answers, we agreed to refer to neurology for further evaluation and advice. Vitamin deficits: Only taking vitamin D regularly, recommend to take the other medications as recommended. Vaccine advice provided. RTC 4 months.

## 2022-03-19 NOTE — Progress Notes (Signed)
Subjective:    Patient ID: [name] [name], female    DOB: 10/06/1950, 71 y.o.   MRN: [id number]  DOS:  03/19/2022 Type of visit - description: f/u  Here for routine follow-up. In general she feels well. Still drinking some alcohol. No recent falls. Denies any suicidal or homicidal ideas.  Reports she is very upset about the diagnosis of MCI.   Review of Systems See above   Past Medical History:  Diagnosis Date   Anemia    duering cancer treatment    Anxiety    Arthritis    OA hands    Bilateral lower extremity pain    Blood transfusion without reported diagnosis    during chemo for lung cancer   Cataract    forming  but very small    Cholelithiasis    Colon polyps    adenomatous   Concussion 08/25/2021   ETOHdependence ([place])    High cholesterol    History of chemotherapy    for lung cancer- completed 2013   Hx of radiation therapy 10/07/11 to 11/20/11   right lung   Hx of radiation therapy 01/07/2012- 01/21/12   cranial irradiation   Hypertension    Lung cancer ([place]) 09/19/2011   Stage III currently in remission   Malignant melanoma of skin of canthus of right eye ([place]) 02/2017   Osteoporosis 04/2014   Dexa scan by Dr. Corinna Capra, T-score -2.7, next in 2 years, with at least 13 % major fracture in 10 years   Pitting edema    Bilateral lower extremities, duplex ultrasound 04/03/2015 normal, no DVT    Past Surgical History:  Procedure Laterality Date   CHOLECYSTECTOMY  04/2011   biliary stent placement   CLOSED REDUCTION FINGER WITH PERCUTANEOUS PINNING Left 10/23/2021   Procedure: LEFT INDEX, MIDDLE, RING, AND SMALL FINGER CLOSED REDUCTION FINGER WITH PERCUTANEOUS PINNING;  Surgeon: Sherilyn Cooter, MD;  Location: [place];  Service: Orthopedics;  Laterality: Left;   COLONOSCOPY     INCISIONAL HERNIA REPAIR  2015   MOHS SURGERY Right 02/2017   cheek and eyelid   Josph Macho Touch     Vaginal procedure   POLYPECTOMY     TUBAL LIGATION       Current Outpatient Medications  Medication Instructions   acetaminophen (TYLENOL) 500 mg, Every 6 hours PRN   aspirin EC 81 mg, Oral, Daily   carvedilol (COREG) 6.25 mg, Oral, 2 times daily with meals   citalopram (CELEXA) 20 mg, Oral, Daily   cyanocobalamin 1000 MCG tablet Take 2 tablets by mouth daily.   folic acid (FOLVITE) 1 mg, Oral, Daily   hydrOXYzine (ATARAX) 10-20 mg, Oral, At bedtime PRN   raloxifene (EVISTA) 60 mg, Oral, Daily   Repatha SureClick 102 mg, Subcutaneous, Every 14 days, <PLEASE MAKE APPOINTMENT FOR REFILLS>   rosuvastatin (CRESTOR) 40 MG tablet TAKE 1 TABLET(40 MG) BY MOUTH AT BEDTIME   thiamine (VITAMIN B1) 100 mg, Oral, Daily   Vitamin D3 4,000 Units, Daily       Objective:   Physical Exam BP 136/82   Pulse 96   Temp 98.4 F (36.9 C) (Oral)   Resp 16   Ht 5\' 2"  (1.575 m)   Wt 154 lb (69.9 kg)   SpO2 96%   BMI 28.17 kg/m  General:   Well developed, NAD, BMI noted. HEENT:  Normocephalic . Face symmetric, atraumatic Lungs:  CTA B Normal respiratory effort, no intercostal retractions, no accessory muscle use. Heart: RRR,  no murmur.  Lower extremities: no pretibial edema bilaterally  Skin: Not pale. Not jaundice Neurologic:  alert & oriented X3 (to self, time -except for day of the week- and place). speech normal, gait appropriate for age and unassisted Psych--  Cognition and judgment appear intact.  Cooperative with normal attention span and concentration.  Behavior appropriate. No anxious or depressed appearing.      Assessment     ASSESSMENT  HTN Hyperlipidemia -- cramps with Lipitor dc 2015 Depression . Anxiety -- on citalopram, used xanax before, re start if needed Osteoporosis-- DEXAs  at gyn --->  was on boniva, now evista  B12 deficiency dx 01/2014 Vit D  def  COPD: Mild last PFTs  CV: Extensive coronary artery calcifications Oncology: -Lung cancer SCC DX 2013, XRT chest and brain -melanoma 2018, R face, s/p Mohs Lower  extremity edema [place] (-) for DVT 03-2015 Coronary calcifications per CT chest 6 2016 IBS, diarrhea predominant   on Imodium as needed; GI 2015-- rx questran, self d/c d/t constipation ASA intolerant (tinnitus) but ok  taking low-dose Admitted 07-2020: Had a fall, L pubic rami fracture Admitted 08-2021: Fall, headache, EtOH, MS changes  PLAN: Depression anxiety: On citalopram, denies any suicidal or homicidal ideas although she has make some "jokes" about killing her OT, see other notes. Recommend to abstain from such comments   Cont present care Insomnia: She could take Atarax if needed.  Reports that her sister who help with her medicines would not give her Atarax if she knows she drank EtOH that night. MCI: MMSE few months ago was 77, she is   upset about she having "dementia", states that she thinks about it every day.  I explained the patient that memory deficit is a range and she is in the mild side of the range.  I also told her that since she is drinking less than before it is entirely possible that her memory may have improved. Things that she can do to protect her memory are mostly stop etoh completely and take vitamins as recommended (she is not).  She was not completely satisfied with my answers, we agreed to refer to neurology for further evaluation and advice. Vitamin deficits: Only taking vitamin D regularly, recommend to take the other medications as recommended. Vaccine advice provided. RTC 4 months.   Time spent 30 min, extensive listening therapy, extensive discussion about MCI

## 2022-04-03 ENCOUNTER — Telehealth: Payer: Self-pay | Admitting: Internal Medicine

## 2022-04-03 NOTE — Telephone Encounter (Signed)
Called patient regarding upcoming 2024 appoointments, left a voicemail.

## 2022-04-03 NOTE — Telephone Encounter (Signed)
[name] re-enrollment for hypercholesterolemia-medicare access submitted  [facility] ID [id number]  START DATE 03/04/2022   END DATE 03/04/2023   ASSISTANCE TYPE Co-pay   PAID $0.00   PENDING $0.00   BALANCE $2500.00   DIAGNOSIS VERIFICATION STATUS Not Required   MEDICARE VERIFICATION STATUS Not Required  Pharmacy Card CARD NO. [id number]   CARD STATUS Active   BIN [id number]   PCN [id number]   PC GROUP [id number]  PROVIDER PDMI   PROCESSOR PDMI

## 2022-04-09 ENCOUNTER — Ambulatory Visit: Payer: Medicare Other | Admitting: Neurology

## 2022-04-21 IMAGING — DX DG HAND COMPLETE 3+V*L*
3 series · 3 of 3 positions shown · non-contrast
Comparison: None.

CLINICAL DATA: Left hand pain after fall

EXAM:
LEFT HAND - COMPLETE 3+ VIEW

[hand pa]
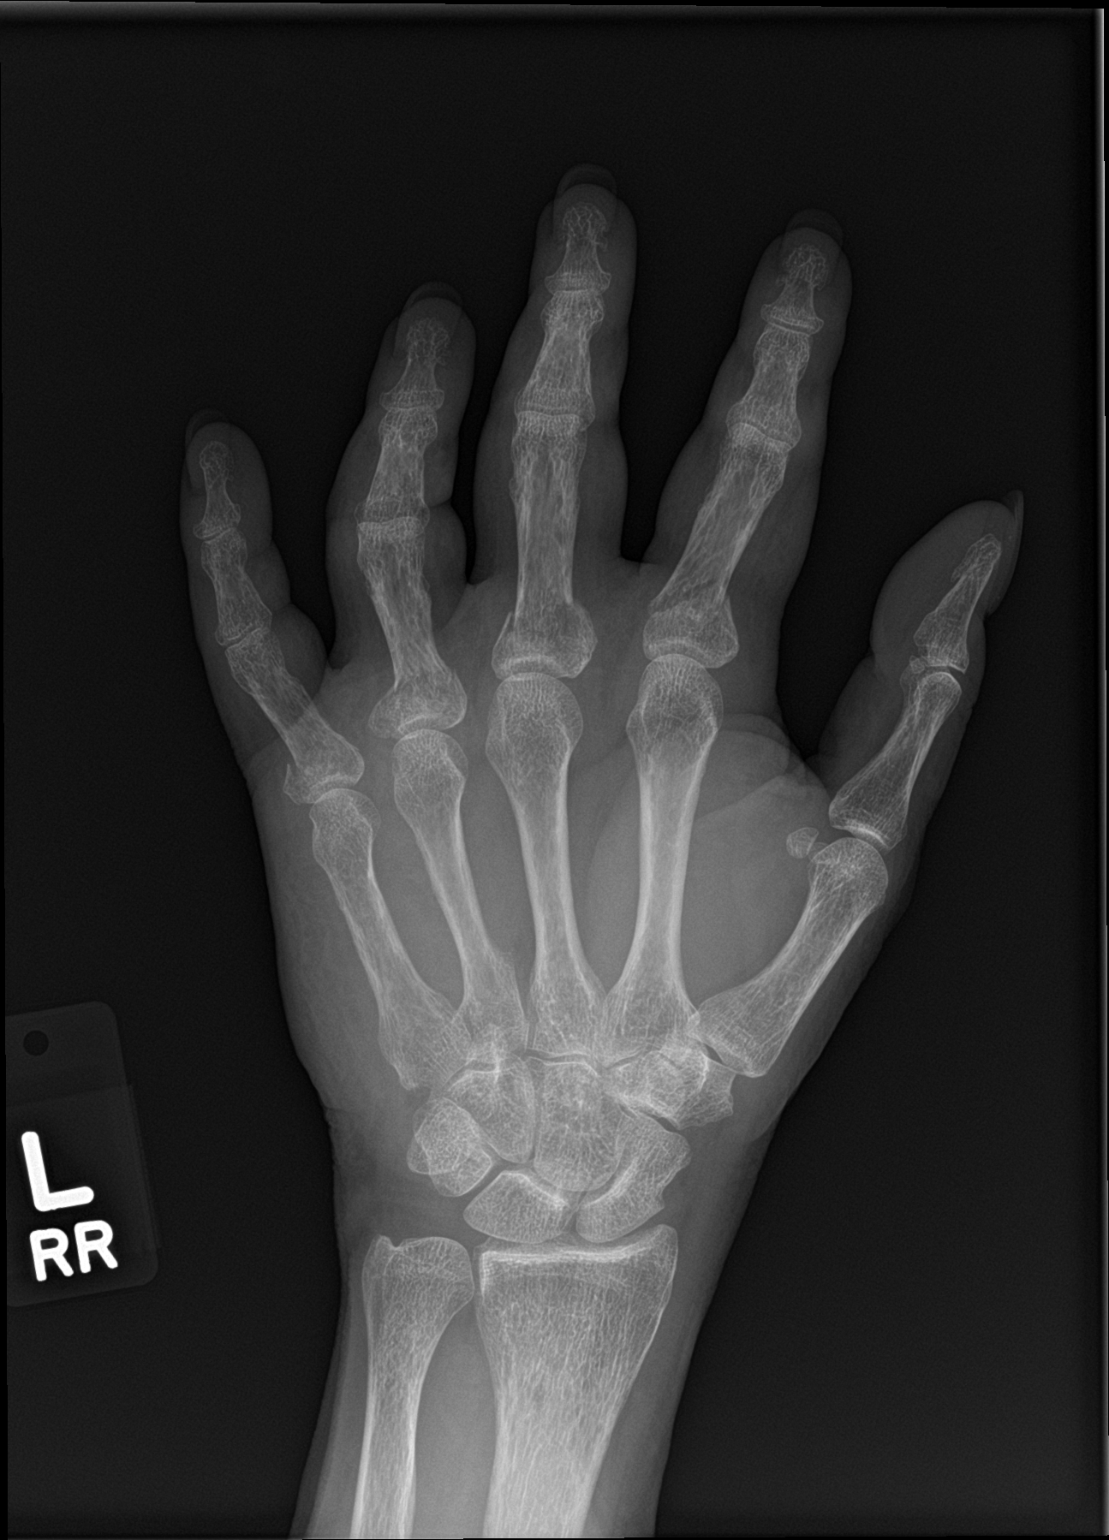

[hand obl]
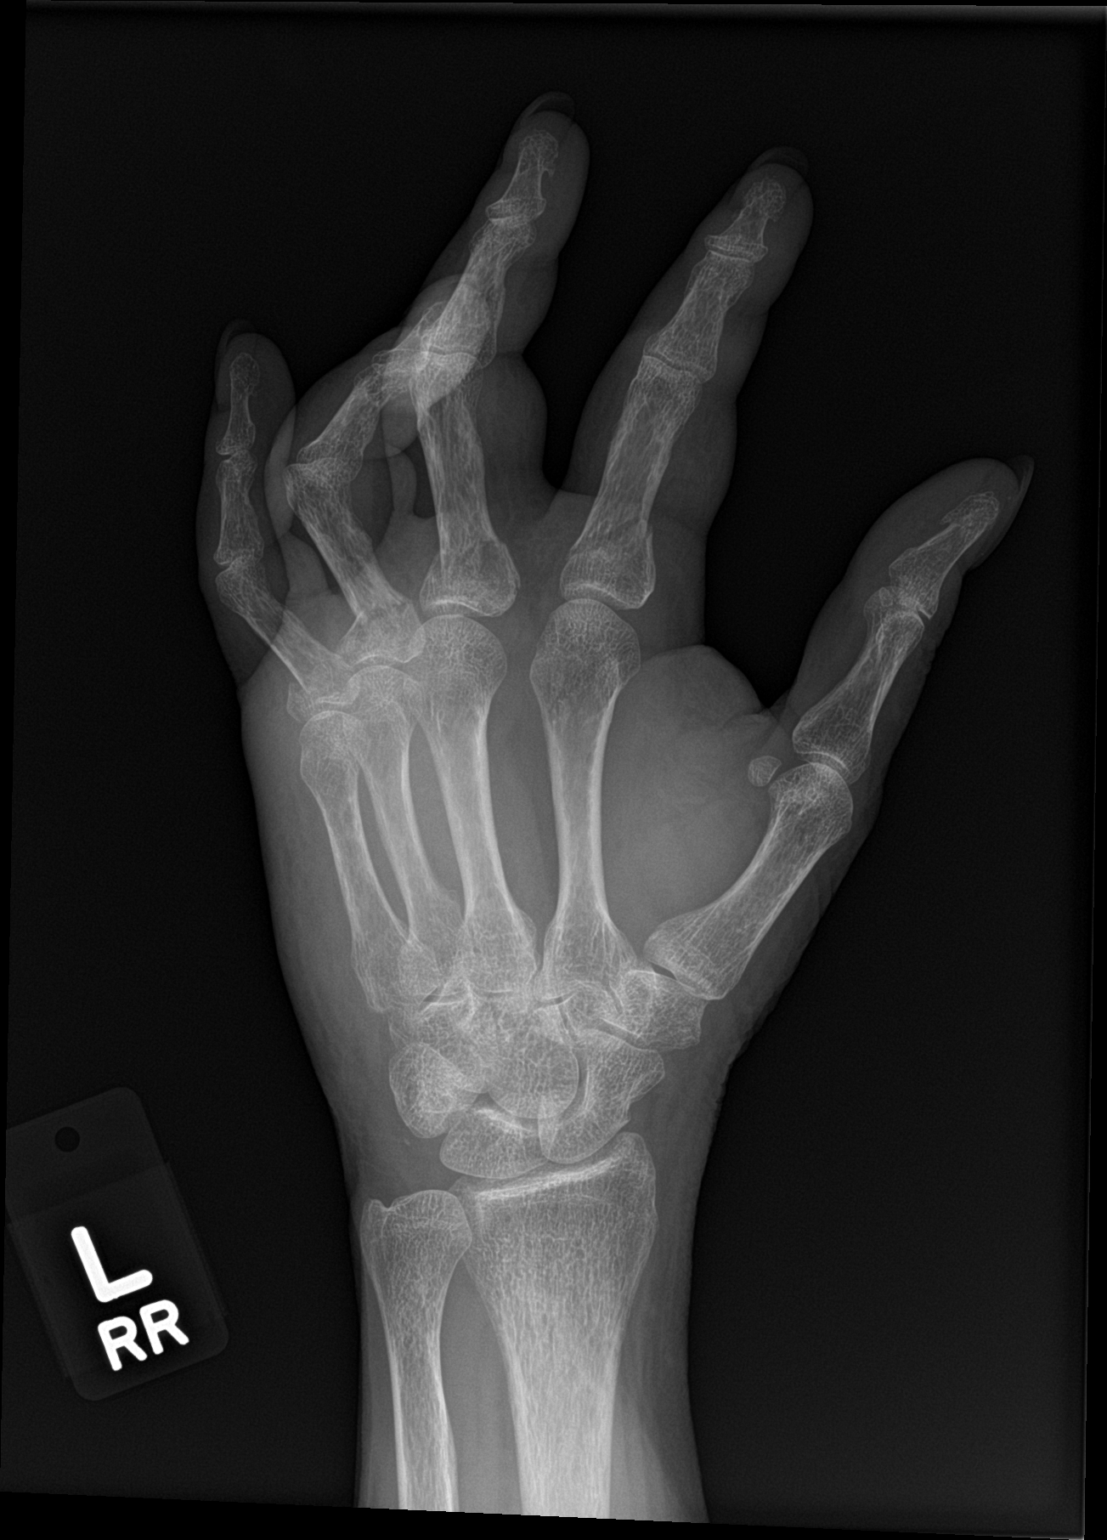

[hand lat]
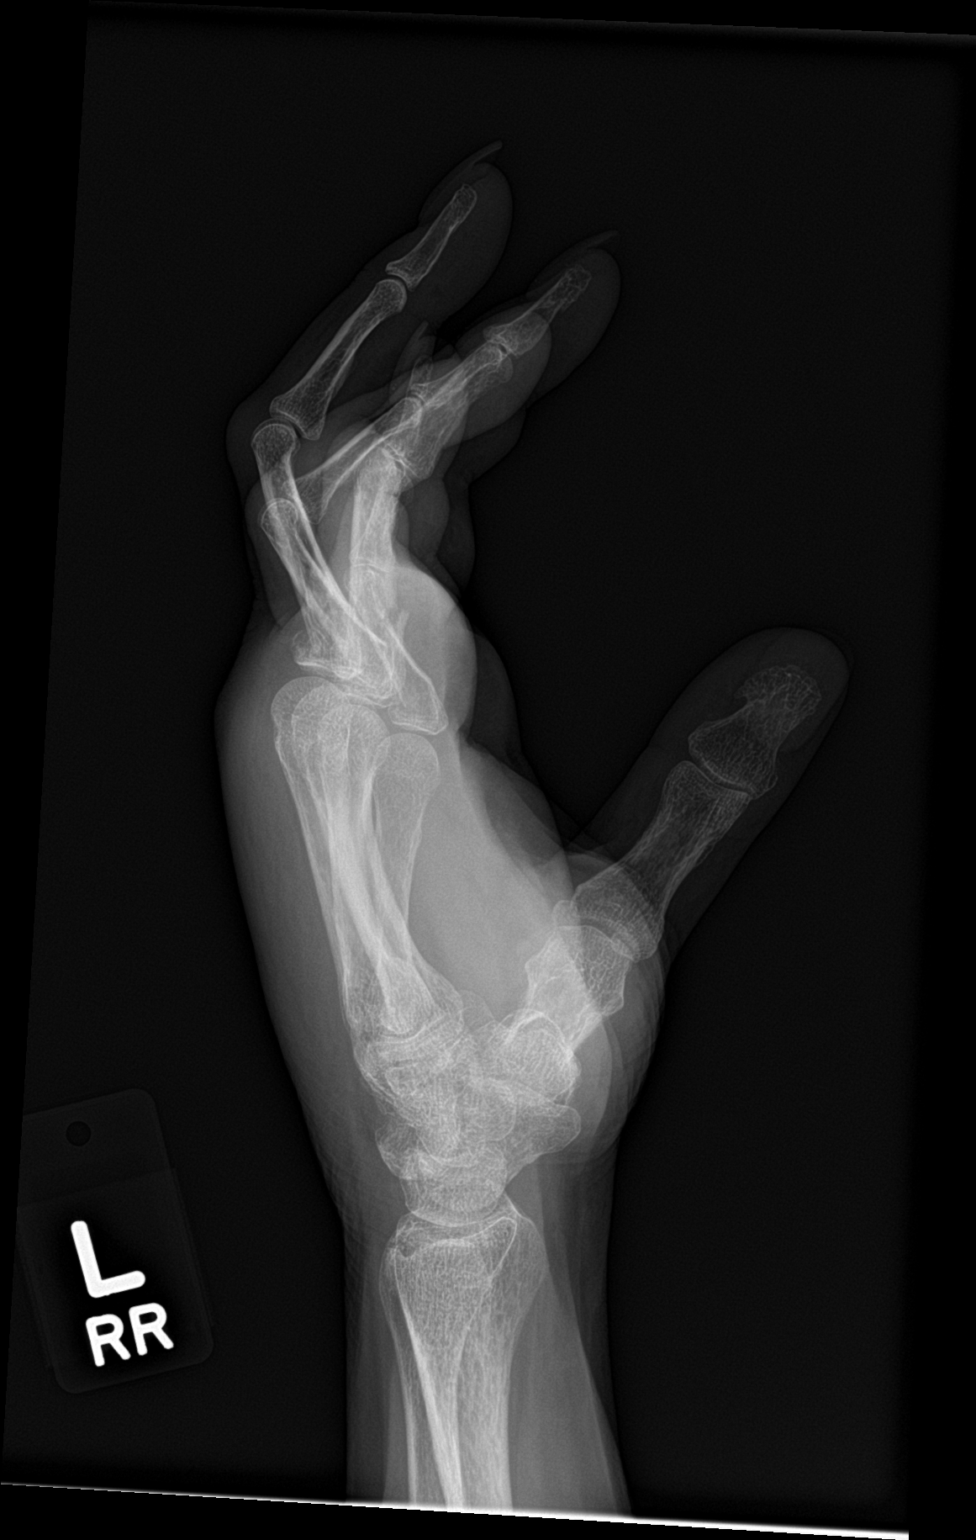

[3 of 3 positions shown; findings below may reference images not displayed]

FINDINGS: Acute minimally displaced and dorsally angulated fractures involving
the proximal metaphyses of the index finger, long finger, ring
finger, and small finger proximal phalanxes. No additional fractures
identified. No dislocation. Bones are diffusely demineralized.
Diffuse soft tissue swelling of the hand.
IMPRESSION: 1. Acute minimally displaced and dorsally angulated fractures
involving the proximal metaphyses of the index finger, long finger,
ring finger, and small finger proximal phalanxes.
2. Osteopenia.

## 2022-04-22 ENCOUNTER — Ambulatory Visit: Payer: Medicare Other

## 2022-04-24 ENCOUNTER — Other Ambulatory Visit: Payer: Self-pay | Admitting: Internal Medicine

## 2022-04-24 ENCOUNTER — Other Ambulatory Visit: Payer: Self-pay

## 2022-05-26 ENCOUNTER — Telehealth: Payer: Self-pay

## 2022-05-26 NOTE — Telephone Encounter (Signed)
Caller Name [place] Phone Number [phone] Patient Name [name] [name] Patient DOB September 08, 1950 Call Type Message Only Information Provided Reason for Call Request to Reschedule Office Appointment Initial Comment Caller states she may have missed her appointment yesterday. Patient request to speak to RN No Additional Comment She says she accidentally booked 2 appointments in 1 day and is sorry. Disp. Time Disposition Final User 05/24/2022 10:41:15 AM General Information Provided Yes Wille Celeste Call Closed By: Wille Celeste Transaction Date/Time: 05/24/2022 10:38:48 AM (ET)

## 2022-06-21 ENCOUNTER — Other Ambulatory Visit: Payer: Self-pay | Admitting: Internal Medicine

## 2022-07-02 ENCOUNTER — Other Ambulatory Visit: Payer: Self-pay | Admitting: Internal Medicine

## 2022-07-21 ENCOUNTER — Encounter: Payer: Self-pay | Admitting: Internal Medicine

## 2022-07-21 ENCOUNTER — Ambulatory Visit (HOSPITAL_BASED_OUTPATIENT_CLINIC_OR_DEPARTMENT_OTHER)
Admission: RE | Admit: 2022-07-21 | Discharge: 2022-07-21 | Disposition: A | Discharge status: Home / Self Care | Payer: Medicare Other | Source: Ambulatory Visit | Attending: Internal Medicine | Admitting: Internal Medicine

## 2022-07-21 ENCOUNTER — Telehealth: Payer: Self-pay | Admitting: Internal Medicine

## 2022-07-21 ENCOUNTER — Ambulatory Visit (INDEPENDENT_AMBULATORY_CARE_PROVIDER_SITE_OTHER): Payer: Medicare Other | Admitting: Internal Medicine

## 2022-07-21 VITALS — BP 136/82 | HR 99 | Temp 98.7°F | Resp 18 | Ht 62.0 in | Wt 159.2 lb

## 2022-07-21 DIAGNOSIS — Z85118 Personal history of other malignant neoplasm of bronchus and lung: Secondary | ICD-10-CM

## 2022-07-21 DIAGNOSIS — M533 Sacrococcygeal disorders, not elsewhere classified: Secondary | ICD-10-CM | POA: Diagnosis not present

## 2022-07-21 DIAGNOSIS — F1029 Alcohol dependence with unspecified alcohol-induced disorder: Secondary | ICD-10-CM

## 2022-07-21 DIAGNOSIS — E785 Hyperlipidemia, unspecified: Secondary | ICD-10-CM

## 2022-07-21 DIAGNOSIS — M545 Low back pain, unspecified: Secondary | ICD-10-CM | POA: Diagnosis not present

## 2022-07-21 DIAGNOSIS — E538 Deficiency of other specified B group vitamins: Secondary | ICD-10-CM

## 2022-07-21 DIAGNOSIS — I10 Essential (primary) hypertension: Secondary | ICD-10-CM

## 2022-07-21 DIAGNOSIS — G8929 Other chronic pain: Secondary | ICD-10-CM

## 2022-07-21 DIAGNOSIS — F418 Other specified anxiety disorders: Secondary | ICD-10-CM

## 2022-07-21 NOTE — Telephone Encounter (Signed)
[name] (sister DPR OK) called asking to speak to Dr. Larose Kells on pt's plan of care. She stated that pt has been refusing to have her present during appts with him and is concerned with getting her the care she needs. [name] would like a call to go over these issues when Dr. Larose Kells is available after 2p (she works till then). Please Advise.

## 2022-07-21 NOTE — Assessment & Plan Note (Signed)
HTN: BP is very good today, continue carvedilol. High cholesterol: Per the cardiology clinic Depression anxiety: On Celexa, seems well controlled, history of EtOH, avoid benzodiazepines. Osteoporosis: Per gynecology Vitamin deficiencies: Not taking vitamins, has problems swallowing large pills, recommend to look for liquid formulations. Pain, R low back: History of lung cancer, check x-ray.  Further advised with results.  Consider Ortho referral MCI: Declined to see neurology, she told me he is afraid what they are necessary.  On her self-assessment she is stable.  I will ask her to come back in 3 months to do formal mental exam EtOH: Again strongly recommend avoidance. Preventive care reviewed Addendum, after the patient left the office, the sister request to talk to me. I summarized the visit for [name] [name], the sister. She tells me that she is drinking more than what she is letting me know. She is concerned about her falling, I agree.  The plan is to wait for the outcome of the x-ray and possible Ortho evaluation then we will refer her to physical therapy. We talk about safety issues, fortunately she does not drink and drive. Obviously her sister is very concerned about it, she is frustrated, extensive listening therapy provided.   RTC 3 months

## 2022-07-21 NOTE — Telephone Encounter (Signed)
Had an extensive conversation with the sister, approximately 20 minutes.  Documentation in the office visit from today.

## 2022-07-21 NOTE — Progress Notes (Signed)
Subjective:    Patient ID: [name] [name], female    DOB: [birth year], 71 y.o.   MRN: [id number]  DOS:  07/21/2022 Type of visit - description: Follow-up  Since the last office is doing a the same. MCI: patient declined to see neurology.  Her self assessment is that her memory is okay Emotionally she feels okay.  Did report pain at the right low back, worse with walking or even rolling in bed. No recent falls.  EtOH: Still drinking nightly.  Review of Systems See above   Past Medical History:  Diagnosis Date   Anemia    duering cancer treatment    Anxiety    Arthritis    OA hands    Bilateral lower extremity pain    Blood transfusion without reported diagnosis    during chemo for lung cancer   Cataract    forming  but very small    Cholelithiasis    Colon polyps    adenomatous   Concussion 08/25/2021   ETOHdependence ([place])    High cholesterol    History of chemotherapy    for lung cancer- completed 2013   Hx of radiation therapy 10/07/11 to 11/20/11   right lung   Hx of radiation therapy 01/07/2012- 01/21/12   cranial irradiation   Hypertension    Lung cancer ([place]) 09/19/2011   Stage III currently in remission   Malignant melanoma of skin of canthus of right eye ([place]) 02/2017   Osteoporosis 04/2014   Dexa scan by Dr. Corinna Capra, T-score -2.7, next in 2 years, with at least 13 % major fracture in 10 years   Pitting edema    Bilateral lower extremities, duplex ultrasound 04/03/2015 normal, no DVT    Past Surgical History:  Procedure Laterality Date   CHOLECYSTECTOMY  04/2011   biliary stent placement   CLOSED REDUCTION FINGER WITH PERCUTANEOUS PINNING Left 10/23/2021   Procedure: LEFT INDEX, MIDDLE, RING, AND SMALL FINGER CLOSED REDUCTION FINGER WITH PERCUTANEOUS PINNING;  Surgeon: Sherilyn Cooter, MD;  Location: [place];  Service: Orthopedics;  Laterality: Left;   COLONOSCOPY     INCISIONAL HERNIA REPAIR  2015   MOHS SURGERY Right 02/2017    cheek and eyelid   Josph Macho Touch     Vaginal procedure   POLYPECTOMY     TUBAL LIGATION      Current Outpatient Medications  Medication Instructions   acetaminophen (TYLENOL) 500 mg, Every 6 hours PRN   aspirin EC 81 mg, Oral, Daily   carvedilol (COREG) 6.25 mg, Oral, 2 times daily with meals   citalopram (CELEXA) 20 mg, Oral, Daily   cyanocobalamin 1000 MCG tablet Take 2 tablets by mouth daily.   Evolocumab (REPATHA SURECLICK) 086 MG/ML SOAJ ADMINISTER 1 ML UNDER THE SKIN EVERY 14 DAYS   folic acid (FOLVITE) 1 mg, Oral, Daily   hydrOXYzine (ATARAX) 10-20 mg, Oral, At bedtime PRN   raloxifene (EVISTA) 60 mg, Oral, Daily   rosuvastatin (CRESTOR) 40 MG tablet TAKE 1 TABLET(40 MG) BY MOUTH AT BEDTIME   thiamine (VITAMIN B1) 100 mg, Oral, Daily   Vitamin D3 4,000 Units, Daily       Objective:   Physical Exam BP 136/82   Pulse 99   Temp 98.7 F (37.1 C) (Oral)   Resp 18   Ht 5\' 2"  (1.575 m)   Wt 159 lb 4 oz (72.2 kg)   SpO2 93%   BMI 29.13 kg/m  General:   Well developed, NAD, BMI  noted. HEENT:  Normocephalic . Face symmetric, atraumatic Lungs:  CTA B Normal respiratory effort, no intercostal retractions, no accessory muscle use. Heart: RRR,  no murmur. MSK: Moderately TTP at the R SI joint.  No TTP at the lumbar spine per se Hip rotation: Normal Lower extremities: no pretibial edema bilaterally  Skin: Not pale. Not jaundice Neurologic:  alert & oriented X3.  (No formal mental exam performed today) Speech normal, gait appropriate but slightly unsteady Psych--  Cognition and judgment appear intact.  Cooperative with normal attention span and concentration.  Behavior appropriate. No anxious or depressed appearing.      Assessment     ASSESSMENT  HTN Hyperlipidemia -- cramps with Lipitor dc 2015 Depression . Anxiety (  avoid benzos d/t etoh Osteoporosis-- DEXAs  at gyn --->  was on boniva, now evista  B12 deficiency dx 01/2014 Vit D  def  COPD: Mild last  PFTs  CV: Extensive coronary artery calcifications Oncology: -Lung cancer SCC DX 2013, XRT chest and brain -melanoma 2018, R face, s/p Mohs Lower extremity edema [place] (-) for DVT 03-2015 Coronary calcifications per CT chest 6 2016 IBS, diarrhea predominant   on Imodium as needed; GI 2015-- rx questran, self d/c d/t constipation ASA intolerant (tinnitus) but ok  taking low-dose Admitted 07-2020: Had a fall, L pubic rami fracture Admitted 08-2021: Fall, headache, EtOH, MS changes    Plan: HTN: BP is very good today, continue carvedilol. High cholesterol: Per the cardiology clinic Depression anxiety: On Celexa, seems well controlled, history of EtOH, avoid benzodiazepines. Osteoporosis: Per gynecology Vitamin deficiencies: Not taking vitamins, has problems swallowing large pills, recommend to look for liquid formulations. Pain, R low back: History of lung cancer, check x-ray.  Further advised with results.  Consider Ortho referral MCI: Declined to see neurology, she told me he is afraid what they are necessary.  On her self-assessment she is stable.  I will ask her to come back in 3 months to do formal mental exam EtOH: Again strongly recommend avoidance. Preventive care reviewed Addendum, after the patient left the office, the sister request to talk to me. I summarized the visit for [name] [name], the sister. She tells me that she is drinking more than what she is letting me know. She is concerned about her falling, I agree.  The plan is to wait for the outcome of the x-ray and possible Ortho evaluation then we will refer her to physical therapy. We talk about safety issues, fortunately she does not drink and drive. Obviously her sister is very concerned about it, she is frustrated, extensive listening therapy provided.   RTC 3 months Time spent 50 minutes, in addition to see the patient and manage her chronic medical problems, I spoke with the patient's daughter extensively.

## 2022-07-21 NOTE — Assessment & Plan Note (Signed)
Preventive care reviewed -Td shot 2019 - s/p zostavax ; Shingrix x1, rec to proceed with Shingrix No. 2 - pnm 23: 2003 and 06-2017; prevnar 2016.  PNM 20: Declines today -COVID VAX: Booster recommended - Had a flu shot -Sees gyn, MMG November 2022. -CCS: colonoscopy in 2007 @ [facility], 2 polyps Colonoscopy 05-2013 @ [place], + polyps, C-scope 09/08/2019, next 7 years per GI letter

## 2022-07-21 NOTE — Patient Instructions (Addendum)
Vaccines I recommend:  Covid booster Shingrix (shingles) #2 RSV vaccine  Please bring [place] a copy of your Healthcare Power of Attorney for your chart.      GO TO THE FRONT DESK, PLEASE SCHEDULE YOUR APPOINTMENTS Come back for   a checkup in 3 months  STOP BY THE FIRST FLOOR:  get the XR

## 2022-07-23 NOTE — Addendum Note (Signed)
Addended byDamita Dunnings D on: 07/23/2022 08:50 AM   Modules accepted: Orders

## 2022-07-24 DIAGNOSIS — H43812 Vitreous degeneration, left eye: Secondary | ICD-10-CM | POA: Diagnosis not present

## 2022-07-24 DIAGNOSIS — G43819 Other migraine, intractable, without status migrainosus: Secondary | ICD-10-CM | POA: Diagnosis not present

## 2022-07-24 DIAGNOSIS — H2513 Age-related nuclear cataract, bilateral: Secondary | ICD-10-CM | POA: Diagnosis not present

## 2022-07-24 DIAGNOSIS — H10413 Chronic giant papillary conjunctivitis, bilateral: Secondary | ICD-10-CM | POA: Diagnosis not present

## 2022-07-28 ENCOUNTER — Telehealth: Payer: Self-pay | Admitting: *Deleted

## 2022-07-28 DIAGNOSIS — M25511 Pain in right shoulder: Secondary | ICD-10-CM | POA: Diagnosis not present

## 2022-07-28 DIAGNOSIS — M542 Cervicalgia: Secondary | ICD-10-CM | POA: Diagnosis not present

## 2022-07-28 NOTE — Telephone Encounter (Signed)
Contact Type Call Who Is Calling Patient / Member / Family / Caregiver Caller Name [place] Phone Number [phone] Patient Name same Patient DOB 1950-09-15 Call Type Message Only Information Provided Reason for Call Request for Lab/Test Results Initial Comment caller states she's needing to speak with Dr. Larose Kells nurse about her test results, caller is upset that the nursed called and gave her sister details of her test results before calling her Disp. Time Disposition Final User 07/26/2022 9:53:24 AM General Information Provided Yes Kenton Kingfisher, [place]

## 2022-07-29 NOTE — Telephone Encounter (Signed)
  Damita Dunnings, CMA 07/23/2022  8:53 AM EST     Spoke w/ Pt- informed of results and recommendations. Pt verbalized understanding. Ortho referral placed.   Spoke w/ [name]- Pt's sister, informed her of results and recommendations. She verbalized understanding. She also wanted to make PCP aware that she never told Robynne that she couldn't take more vaccines but they did agree on no more covid vaccines.   Colon Branch, MD 07/22/2022  5:02 PM EST     (History of lung cancer, no suspicious findings on plain x-ray). Advise patient: X-rays okay, refer to Ortho for back pain. Also, please call her sister let her know x-rays are okay.    See above, I spoke w/ Pt prior to speaking w/ [name], Pt's sister.

## 2022-08-12 ENCOUNTER — Telehealth: Payer: Self-pay | Admitting: Internal Medicine

## 2022-08-12 NOTE — Telephone Encounter (Signed)
PA for repatha submitted via CMM Message received:  Authorization already on file for this request. Authorization starting on 08/26/2019 and ending on 08/25/2023.

## 2022-08-19 ENCOUNTER — Other Ambulatory Visit: Payer: Self-pay | Admitting: Internal Medicine

## 2022-08-27 DIAGNOSIS — M545 Low back pain, unspecified: Secondary | ICD-10-CM | POA: Diagnosis not present

## 2022-08-29 DIAGNOSIS — M545 Low back pain, unspecified: Secondary | ICD-10-CM | POA: Diagnosis not present

## 2022-09-05 DIAGNOSIS — M545 Low back pain, unspecified: Secondary | ICD-10-CM | POA: Diagnosis not present

## 2022-09-06 ENCOUNTER — Emergency Department (HOSPITAL_BASED_OUTPATIENT_CLINIC_OR_DEPARTMENT_OTHER): Payer: Medicare Other

## 2022-09-06 ENCOUNTER — Other Ambulatory Visit: Payer: Self-pay

## 2022-09-06 ENCOUNTER — Emergency Department (HOSPITAL_BASED_OUTPATIENT_CLINIC_OR_DEPARTMENT_OTHER)
Admission: EM | Admit: 2022-09-06 | Discharge: 2022-09-06 | Disposition: A | Discharge status: Home / Self Care | Payer: Medicare Other | Attending: Emergency Medicine | Admitting: Emergency Medicine

## 2022-09-06 DIAGNOSIS — Z7982 Long term (current) use of aspirin: Secondary | ICD-10-CM | POA: Diagnosis not present

## 2022-09-06 DIAGNOSIS — J449 Chronic obstructive pulmonary disease, unspecified: Secondary | ICD-10-CM | POA: Insufficient documentation

## 2022-09-06 DIAGNOSIS — W1830XA Fall on same level, unspecified, initial encounter: Secondary | ICD-10-CM | POA: Insufficient documentation

## 2022-09-06 DIAGNOSIS — I10 Essential (primary) hypertension: Secondary | ICD-10-CM | POA: Diagnosis not present

## 2022-09-06 DIAGNOSIS — S32030A Wedge compression fracture of third lumbar vertebra, initial encounter for closed fracture: Secondary | ICD-10-CM | POA: Insufficient documentation

## 2022-09-06 DIAGNOSIS — Z87891 Personal history of nicotine dependence: Secondary | ICD-10-CM | POA: Insufficient documentation

## 2022-09-06 DIAGNOSIS — Z79899 Other long term (current) drug therapy: Secondary | ICD-10-CM | POA: Insufficient documentation

## 2022-09-06 DIAGNOSIS — M5416 Radiculopathy, lumbar region: Secondary | ICD-10-CM | POA: Diagnosis not present

## 2022-09-06 DIAGNOSIS — Z85118 Personal history of other malignant neoplasm of bronchus and lung: Secondary | ICD-10-CM | POA: Insufficient documentation

## 2022-09-06 DIAGNOSIS — S3992XA Unspecified injury of lower back, initial encounter: Secondary | ICD-10-CM | POA: Diagnosis present

## 2022-09-06 MED ORDER — OXYCODONE HCL 5 MG PO TABS
5.0000 mg | ORAL_TABLET | Freq: Once | ORAL | Status: AC
  Administered 2022-09-06: 5 mg via ORAL
  Filled 2022-09-06: qty 1

## 2022-09-06 MED ORDER — ACETAMINOPHEN 500 MG PO TABS
1000.0000 mg | ORAL_TABLET | Freq: Once | ORAL | Status: AC
  Administered 2022-09-06: 1000 mg via ORAL
  Filled 2022-09-06: qty 2

## 2022-09-06 MED ORDER — KETOROLAC TROMETHAMINE 60 MG/2ML IM SOLN
15.0000 mg | Freq: Once | INTRAMUSCULAR | Status: AC
  Administered 2022-09-06: 15 mg via INTRAMUSCULAR
  Filled 2022-09-06: qty 2

## 2022-09-06 MED ORDER — LIDOCAINE 5 % EX PTCH
1.0000 | MEDICATED_PATCH | CUTANEOUS | Status: DC
  Administered 2022-09-06: 1 via TRANSDERMAL
  Filled 2022-09-06: qty 1

## 2022-09-06 MED ORDER — OXYCODONE HCL 5 MG PO TABS: 5.0000 mg | ORAL_TABLET | Freq: Four times a day (QID) | ORAL | 0 refills | Status: AC | PRN

## 2022-09-06 NOTE — ED Notes (Signed)
Pt stable at time of discharge. RR even and unlabored. No distress noted. Pt verbalized understanding of the discharge instructions as discussed. Pt ambulatory to lobby with walker.

## 2022-09-06 NOTE — Discharge Instructions (Addendum)
Thank you for coming to [facility] Emergency Department. You were seen for back pain. We did an exam, labs, and imaging, and these showed lumbar spine compression fracture at L3.   You can alternate taking Tylenol and ibuprofen as needed for pain. You can take 650mg  tylenol (acetaminophen) every 4-6 hours, and 600 mg ibuprofen 3 times a day for 7 days. You can also take oxycodone 5 mg every 6 hours as needed for severe breakthrough pain. Over the counter lidocaine patches can help, and heat/ice can also help.  Please follow up with your spine doctor within 1 week. Follow up the results of the MRI you had yesterday.  Return to the ED if you develop any of the following: - Fever (100.4 F or 38 C) or chills at home that do not respond to over the counter medications - Weakness, numbness, or tingling in your extremities - Difficulty emptying bladder / urinary incontinence - Fecal incontinence - Uncontrolled nausea/vomiting with inability to keep down liquids - Feeling as though you are going to pass out or passing out - Anything else that concerns you

## 2022-09-06 NOTE — ED Notes (Signed)
Patient placed on PureWick

## 2022-09-06 NOTE — ED Notes (Signed)
Pt ambulatory with walker. Pt uses walker at home

## 2022-09-06 NOTE — ED Provider Notes (Signed)
[facility] EMERGENCY DEPARTMENT Provider Note   CSN: [id number] Arrival date & time: 09/06/22  1446     History  No chief complaint on file.   [name] [name] is a 72 y.o. female with HTN, IBS, COPD, history of lung cancer, tobacco abuse, EtOH dependence, urinary incontinence, DJD, osteoporosis, anxiety/depression who presents with LBP.   C/o mechanical fall one month ago, c/o lower right back pain since fall, progressively worsening.  Got to the point that last night she was lying in bed and felt like she could not get out of bed due to pain and so [facility].  Reports intermittent  BL numbness, pin and needles in feet since fall but she doesn't feel it right now. When it comes is located all over her foot.. Denies any new incontinence, but states she has accidents of urine sometimes which is not new for her.  Patient states she has been seen by a lumbar spine specialist and had an MRI yesterday but they do not know what the results are.  She has only had x-rays other than the MRI for imaging and the x-rays demonstrated arthritis but no fractures.  Has a history of lung cancer.  Patient has only taken Tylenol for the pain as she does not like taking pills.  She was recently prescribed prednisone for the back pain by her outpatient provider but has not started to take it yet.  HPI     Home Medications Prior to Admission medications   Medication Sig Start Date End Date Taking? Authorizing Provider  oxyCODONE (ROXICODONE) 5 MG immediate release tablet Take 1 tablet (5 mg total) by mouth every 6 (six) hours as needed for up to 3 days for severe pain. 09/06/22 09/09/22 Yes Loetta Rough, MD  acetaminophen (TYLENOL) 500 MG tablet Take 500 mg by mouth every 6 (six) hours as needed. Patient not taking: Reported on 12/16/2021    [provider]  aspirin EC 81 MG tablet Take 81 mg daily by mouth.    [provider]  carvedilol (COREG) 6.25 MG  tablet Take 1 tablet (6.25 mg total) by mouth 2 (two) times daily with a meal. 10/18/21   [name], MD  Cholecalciferol (VITAMIN D3) 2000 UNITS capsule Take 4,000 Units by mouth daily.  Patient not taking: Reported on 11/05/2021    [provider]  citalopram (CELEXA) 20 MG tablet Take 1 tablet (20 mg total) by mouth daily. 07/02/22   [name], MD  cyanocobalamin 1000 MCG tablet Take 2 tablets by mouth daily. Patient not taking: Reported on 12/16/2021 09/03/21   Love, Evlyn Kanner, PA-C  Evolocumab (REPATHA SURECLICK) 140 MG/ML SOAJ INJECT 140MG  UNDER THE SKIN ONCE EVERY 14 DAYS 08/20/22   Hilty, 08/22/22, MD  folic acid (FOLVITE) 1 MG tablet Take 1 tablet (1 mg total) by mouth daily. Patient not taking: Reported on 11/05/2021 09/03/21   Love, 11/01/21, PA-C  raloxifene (EVISTA) 60 MG tablet Take 1 tablet (60 mg total) by mouth daily. 04/03/20   06/03/20, MD  rosuvastatin (CRESTOR) 40 MG tablet TAKE 1 TABLET(40 MG) BY MOUTH AT BEDTIME 12/30/21   Hilty, 03/01/22, MD  thiamine 100 MG tablet Take 1 tablet (100 mg total) by mouth daily. Patient not taking: Reported on 11/05/2021 09/04/21   11/02/21, PA-C      Allergies    Tramadol hcl, Codeine, and Penicillins    Review of Systems  Review of Systems Review of systems Negative for f/c.  A 10 point review of systems was performed and is negative unless otherwise reported in HPI.  Physical Exam Updated Vital Signs BP (!) 162/87 (BP Location: Right Arm)   Pulse 92   Temp 98.5 F (36.9 C)   Resp 16   Ht 5\' 2"  (1.575 m)   Wt 70.8 kg   SpO2 96%   BMI 28.53 kg/m  Physical Exam General: Normal appearing female, lying in bed.  HEENT: Sclera anicteric, MMM, trachea midline.  Cardiology: RRR, no murmurs/rubs/gallops. BL radial and DP pulses equal bilaterally.  Resp: Normal respiratory rate and effort. CTAB, no wheezes, rhonchi, crackles.  Abd: Soft, non-tender, non-distended. No rebound tenderness or guarding.  GU:  Deferred. MSK: No peripheral edema or signs of trauma. Extremities without deformity or TTP. No cyanosis or clubbing. Skin: warm, dry. No rashes or lesions. Back: Midline tenderness palpation of the lumbar spine diffusely with no step-offs or deformities.  No evidence of trauma.  Right lumbar paraspinal muscular tenderness palpation.  Negative straight leg raise for radicular pain but painful in the back to move the right leg specifically. Neuro: A&Ox4, CNs II-XII grossly intact. 5/5 strength MAEs. Sensation grossly intact.  Psych: Normal mood and affect.   ED Results / Procedures / Treatments   Labs (all labs ordered are listed, but only abnormal results are displayed) Labs Reviewed - No data to display  EKG None  Radiology CT Lumbar Spine Wo Contrast  Result Date: 09/06/2022 CLINICAL DATA:  Lumbar radiculopathy.  Mechanical fall 1 month ago. EXAM: CT LUMBAR SPINE WITHOUT CONTRAST TECHNIQUE: Multidetector CT imaging of the lumbar spine was performed without intravenous contrast administration. Multiplanar CT image reconstructions were also generated. RADIATION DOSE REDUCTION: This exam was performed according to the departmental dose-optimization program which includes automated exposure control, adjustment of the mA and/or kV according to patient size and/or use of iterative reconstruction technique. COMPARISON:  X-ray 07/21/2022 FINDINGS: Segmentation: 5 lumbar type vertebrae. Alignment: Normal. Vertebrae: Acute superior endplate compression fracture of the L2 vertebral body with up to 35% vertebral body height loss centrally. No significant bony retropulsion. No fracture extension into the posterior elements. Remaining lumbar vertebral body heights are maintained. No additional fractures. No lytic or sclerotic bone lesion. Paraspinal and other soft tissues: Aortic atherosclerosis. No acute findings. Disc levels: Intervertebral disc heights are preserved. No significant facet arthropathy.  IMPRESSION: 1. Acute superior endplate compression fracture of the L2 vertebral body with up to 35% vertebral body height loss centrally. No significant bony retropulsion. 2. Aortic atherosclerosis (ICD10-I70.0). Electronically Signed   By: 07/23/2022 D.O.   On: 09/06/2022 17:16    Procedures Procedures    Medications Ordered in ED Medications  lidocaine (LIDODERM) 5 % 1 patch (1 patch Transdermal Patch Applied 09/06/22 1541)  ketorolac (TORADOL) injection 15 mg (15 mg Intramuscular Given 09/06/22 1542)  oxyCODONE (Oxy IR/ROXICODONE) immediate release tablet 5 mg (5 mg Oral Given 09/06/22 1542)  acetaminophen (TYLENOL) tablet 1,000 mg (1,000 mg Oral Given 09/06/22 1747)    ED Course/ Medical Decision Making/ A&P                          Medical Decision Making Amount and/or Complexity of Data Reviewed Radiology: ordered. Decision-making details documented in ED Course.  Risk OTC drugs. Prescription drug management.    This patient presents to the ED for concern of lower back pain, this involves an extensive number  of treatment options, and is a complaint that carries with it a high risk of complications and morbidity.  I considered the following differential and admission for this acute, potentially life threatening condition.   MDM:    For patient's lower back pain consider lumbar fracture, spinal stenosis, DJD, disc herniation, lumbar strain/sprain.  Very low concern for conus medullaris or cauda equina syndrome as patient has 5 out of 5 strength in bilateral lower extremities, no saddle anesthesia, no new urinary/bowel incontinence or retention.  Patient is neurovascularly intact in both lower extremities on exam.  Good distal pulses, very low concern for aortic syndrome.  Patient has midline tenderness to palpation in her diffusely throughout her lumbar spine as well as the paraspinal musculature on the right.  She does have a history of lung cancer so consider metastasis to the  spine but patient already had an MRI yesterday as an outpatient and they are following up results, do not believe we need to emergently repeat an MRI today.  This back pain has been ongoing for several months after a fall and she is afebrile, low concern for discitis or transverse myelitis.  Will evaluate patient with a CT lumbar spine to look for fracture, stenosis, disc herniation.  Patient is most interested in pain control and has only taken Tylenol for pain thus far.  Will give Toradol, lidocaine patch, and oxycodone and reevaluate.  Clinical Course as of 09/06/22 1857  Sat Sep 06, 2022  1725 CT Lumbar Spine Wo Contrast 1. Acute superior endplate compression fracture of the L2 vertebral body with up to 35% vertebral body height loss centrally. No significant bony retropulsion. 2. Aortic atherosclerosis (ICD10-I70.0).   [HN]  1759 D/w patient her results. She will have LSO and try to ambulate.  [HN]  1855 Patient states her pain has significantly improved after tylenol, toradol, lidocaine patch, and oxycodone. LSO helpful for patient for support she states. Walking well up and down the hallway with walker. Has walker at home she can use. Sister pulled me aside to inform me that patient is alcohol dependent. I discussed with patient that taking oxycodone can increase dizziness, drowsiness, risk of falls. Patient reports understanding and I encouraged patient to drink responsibly and not drink while taking oxycodone. Encouraged to utilize lidocaine patches OTC and heat/ice.  Instructed to call her spine doctor on Monday morning to make an appointment for this week for reevaluation as well as follow-up on the MRI results.  Patient is given extensive discharge instructions and return precautions, instructed to take Tylenol for pain and oxycodone for breakthrough pain.  All questions answered to patient and sister satisfaction.  Sister will check on patient during the day. [HN]    Clinical Course User  Index [HN] Loetta Rough, MD     Imaging Studies ordered: I ordered imaging studies including CT lumbar spine I independently visualized and interpreted imaging. I agree with the radiologist interpretation  Additional history obtained from chart review  Reevaluation: After the interventions noted above, I reevaluated the patient and found that they have :improved  Social Determinants of Health: Patient lives independently   Disposition:  DC  Co morbidities that complicate the patient evaluation  Past Medical History:  Diagnosis Date   Anemia    duering cancer treatment    Anxiety    Arthritis    OA hands    Bilateral lower extremity pain    Blood transfusion without reported diagnosis    during chemo for lung cancer  Cataract    forming  but very small    Cholelithiasis    Colon polyps    adenomatous   Concussion 08/25/2021   ETOHdependence (HCC)    High cholesterol    History of chemotherapy    for lung cancer- completed 2013   Hx of radiation therapy 10/07/11 to 11/20/11   right lung   Hx of radiation therapy 01/07/2012- 01/21/12   cranial irradiation   Hypertension    Lung cancer (HCC) 09/19/2011   Stage III currently in remission   Malignant melanoma of skin of canthus of right eye (HCC) 02/2017   Osteoporosis 04/2014   Dexa scan by Dr. Rana Snare, T-score -2.7, next in 2 years, with at least 13 % major fracture in 10 years   Pitting edema    Bilateral lower extremities, duplex ultrasound 04/03/2015 normal, no DVT     Medicines Meds ordered this encounter  Medications   ketorolac (TORADOL) injection 15 mg   oxyCODONE (Oxy IR/ROXICODONE) immediate release tablet 5 mg   lidocaine (LIDODERM) 5 % 1 patch   oxyCODONE (ROXICODONE) 5 MG immediate release tablet    Sig: Take 1 tablet (5 mg total) by mouth every 6 (six) hours as needed for up to 3 days for severe pain.    Dispense:  12 tablet    Refill:  0   acetaminophen (TYLENOL) tablet 1,000 mg    I have  reviewed the patients home medicines and have made adjustments as needed  Problem List / ED Course: Problem List Items Addressed This Visit   None Visit Diagnoses     Compression fracture of L3 lumbar vertebra, closed, initial encounter [facility])    -  Primary                   This note was created using dictation software, which may contain spelling or grammatical errors.    Loetta Rough, MD 09/06/22 [phone]

## 2022-09-06 NOTE — ED Triage Notes (Addendum)
Pt POV in wheelchair-  C/o mechanical fall one month ago, c/o lower right back pain since fall, progressively worsening. Reports BL numbness, pin and needles in feet since fall. Denies incontinence. Takes 81 mg ASA daily.   Took tylenol 1hr pta.

## 2022-09-06 NOTE — ED Notes (Signed)
Spoke with Scientist, research (life sciences). Will send rep out to fit her in ED

## 2022-09-06 NOTE — ED Notes (Signed)
LSO brace tech at beside.

## 2022-09-06 NOTE — ED Notes (Signed)
LSO brace applied by tech. Pt tolerated well.

## 2022-09-07 ENCOUNTER — Emergency Department (HOSPITAL_COMMUNITY): Payer: Medicare Other

## 2022-09-07 ENCOUNTER — Other Ambulatory Visit: Payer: Self-pay

## 2022-09-07 ENCOUNTER — Emergency Department (HOSPITAL_COMMUNITY)
Admission: EM | Admit: 2022-09-07 | Discharge: 2022-09-08 | Discharge status: Against Medical Advice | Payer: Medicare Other | Attending: Physician Assistant | Admitting: Physician Assistant

## 2022-09-07 DIAGNOSIS — M545 Low back pain, unspecified: Secondary | ICD-10-CM | POA: Diagnosis not present

## 2022-09-07 DIAGNOSIS — Z5321 Procedure and treatment not carried out due to patient leaving prior to being seen by health care provider: Secondary | ICD-10-CM | POA: Diagnosis not present

## 2022-09-07 DIAGNOSIS — Z9049 Acquired absence of other specified parts of digestive tract: Secondary | ICD-10-CM | POA: Diagnosis not present

## 2022-09-07 DIAGNOSIS — W19XXXA Unspecified fall, initial encounter: Secondary | ICD-10-CM | POA: Insufficient documentation

## 2022-09-07 DIAGNOSIS — S32020A Wedge compression fracture of second lumbar vertebra, initial encounter for closed fracture: Secondary | ICD-10-CM | POA: Diagnosis not present

## 2022-09-07 DIAGNOSIS — I700 Atherosclerosis of aorta: Secondary | ICD-10-CM | POA: Diagnosis not present

## 2022-09-07 DIAGNOSIS — S32009A Unspecified fracture of unspecified lumbar vertebra, initial encounter for closed fracture: Secondary | ICD-10-CM | POA: Diagnosis not present

## 2022-09-07 DIAGNOSIS — M5416 Radiculopathy, lumbar region: Secondary | ICD-10-CM | POA: Diagnosis not present

## 2022-09-07 DIAGNOSIS — M549 Dorsalgia, unspecified: Secondary | ICD-10-CM | POA: Insufficient documentation

## 2022-09-07 DIAGNOSIS — R531 Weakness: Secondary | ICD-10-CM | POA: Diagnosis not present

## 2022-09-07 NOTE — ED Provider Triage Note (Signed)
Emergency Medicine Provider Triage Evaluation Note  [name] [name] , a 72 y.o. female  was evaluated in triage.  Pt complains of back pain.  Patient reports she was lying in bed as she does have a known L3 compression fracture, reports she slid off the bed and fell to the ground, has not been able to weight-bear since this incident in a car.  Complaining of pain to her back, did take some oxycodone with some improvement in her symptoms.  No bowel or bladder complaints.  Review of Systems  Positive: Back pain Negative: Urinary retention  Physical Exam  BP 113/79   Pulse 99   Temp 98.7 F (37.1 C) (Oral)   Resp 15   Ht 5\' 2"  (1.575 m)   Wt 70.8 kg   SpO2 96%   BMI 28.53 kg/m  Gen:   Awake, no distress   Resp:  Normal effort  MSK:   Moves extremities without difficulty  Other:  Tenderness to palpation along the lumbar spine  Medical Decision Making  Medically screening exam initiated at 8:20 PM.  Appropriate orders placed.  [name] [name] was informed that the remainder of the evaluation will be completed by another provider, this initial triage assessment does not replace that evaluation, and the importance of remaining in the ED until their evaluation is complete.     Janeece Fitting, PA-C 09/07/22 2020

## 2022-09-07 NOTE — ED Triage Notes (Signed)
Pt BIB EMS for a mechanical fall. Pt recently diagnosed with lumbar fracture. Pt a&ox4.

## 2022-09-08 ENCOUNTER — Other Ambulatory Visit (HOSPITAL_COMMUNITY): Payer: Self-pay | Admitting: Interventional Radiology

## 2022-09-08 ENCOUNTER — Telehealth (HOSPITAL_COMMUNITY): Payer: Self-pay

## 2022-09-08 DIAGNOSIS — S32020A Wedge compression fracture of second lumbar vertebra, initial encounter for closed fracture: Secondary | ICD-10-CM

## 2022-09-08 NOTE — ED Notes (Signed)
Pt daughter stated they were leaving.

## 2022-09-08 NOTE — Telephone Encounter (Signed)
Called to schedule consult and/or KP, no answer, left vm. AW

## 2022-09-08 NOTE — Telephone Encounter (Signed)
Ok per Dr. Corliss Skains for L2 KP. AW

## 2022-09-09 ENCOUNTER — Telehealth (HOSPITAL_COMMUNITY): Payer: Self-pay

## 2022-09-09 NOTE — Telephone Encounter (Signed)
Pt's sister will call back to schedule KP after they discuss. AW

## 2022-09-11 ENCOUNTER — Ambulatory Visit (HOSPITAL_COMMUNITY): Payer: Medicare Other

## 2022-09-16 ENCOUNTER — Ambulatory Visit: Payer: Medicare Other | Admitting: Internal Medicine

## 2022-09-16 ENCOUNTER — Other Ambulatory Visit: Payer: Self-pay | Admitting: Radiology

## 2022-09-16 DIAGNOSIS — M549 Dorsalgia, unspecified: Secondary | ICD-10-CM

## 2022-09-17 ENCOUNTER — Telehealth: Payer: Self-pay | Admitting: Internal Medicine

## 2022-09-17 NOTE — Telephone Encounter (Signed)
Patient called stating she's having surgery tomorrow and she needs oxycodone sent to her pharmacy for tonight and tomorrow. She stated her surgeon advised her to reach out to her PCP. Please advise/

## 2022-09-17 NOTE — Telephone Encounter (Signed)
unable to prescribe.

## 2022-09-17 NOTE — Telephone Encounter (Signed)
LMOM informing Pt that PCP unable to prescribe. Per PCP- okay to take Tylenol or discuss further w/ surgeon.

## 2022-09-18 ENCOUNTER — Other Ambulatory Visit: Payer: Self-pay

## 2022-09-18 ENCOUNTER — Telehealth: Payer: Self-pay | Admitting: Internal Medicine

## 2022-09-18 ENCOUNTER — Ambulatory Visit (HOSPITAL_COMMUNITY)
Admission: RE | Admit: 2022-09-18 | Discharge: 2022-09-18 | Disposition: A | Discharge status: Home / Self Care | Payer: Medicare Other | Source: Ambulatory Visit | Attending: Interventional Radiology | Admitting: Interventional Radiology

## 2022-09-18 DIAGNOSIS — F419 Anxiety disorder, unspecified: Secondary | ICD-10-CM | POA: Diagnosis not present

## 2022-09-18 DIAGNOSIS — W19XXXA Unspecified fall, initial encounter: Secondary | ICD-10-CM | POA: Diagnosis not present

## 2022-09-18 DIAGNOSIS — M549 Dorsalgia, unspecified: Secondary | ICD-10-CM

## 2022-09-18 DIAGNOSIS — S32020A Wedge compression fracture of second lumbar vertebra, initial encounter for closed fracture: Secondary | ICD-10-CM | POA: Diagnosis not present

## 2022-09-18 DIAGNOSIS — Z85118 Personal history of other malignant neoplasm of bronchus and lung: Secondary | ICD-10-CM | POA: Diagnosis not present

## 2022-09-18 DIAGNOSIS — I10 Essential (primary) hypertension: Secondary | ICD-10-CM | POA: Diagnosis not present

## 2022-09-18 DIAGNOSIS — R399 Unspecified symptoms and signs involving the genitourinary system: Secondary | ICD-10-CM

## 2022-09-18 DIAGNOSIS — Z87891 Personal history of nicotine dependence: Secondary | ICD-10-CM | POA: Insufficient documentation

## 2022-09-18 DIAGNOSIS — M4856XA Collapsed vertebra, not elsewhere classified, lumbar region, initial encounter for fracture: Secondary | ICD-10-CM | POA: Diagnosis not present

## 2022-09-18 HISTORY — PX: IR KYPHO LUMBAR INC FX REDUCE BONE BX UNI/BIL CANNULATION INC/IMAGING: IMG5519

## 2022-09-18 LAB — BASIC METABOLIC PANEL
Anion gap: 11 (ref 5–15)
BUN: 15 mg/dL (ref 8–23)
CO2: 23 mmol/L (ref 22–32)
Calcium: 9.1 mg/dL (ref 8.9–10.3)
Chloride: 105 mmol/L (ref 98–111)
Creatinine, Ser: 0.84 mg/dL (ref 0.44–1.00)
GFR, Estimated: >60 mL/min (ref >60)
Glucose, Bld: 110 mg/dL — ABNORMAL HIGH (ref 70–99)
Potassium: 3.7 mmol/L (ref 3.5–5.1)
Sodium: 139 mmol/L (ref 135–145)

## 2022-09-18 LAB — CBC
HCT: 39.2 % (ref 36.0–46.0)
Hemoglobin: 12.8 g/dL (ref 12.0–15.0)
MCH: 31.9 pg (ref 26.0–34.0)
MCHC: 32.7 g/dL (ref 30.0–36.0)
MCV: 97.8 fL (ref 80.0–100.0)
Platelets: 201 10*3/uL (ref 150–400)
RBC: 4.01 MIL/uL (ref 3.87–5.11)
RDW: 12.1 % (ref 11.5–15.5)
WBC: 9.7 10*3/uL (ref 4.0–10.5)
nRBC: 0 % (ref 0.0–0.2)

## 2022-09-18 LAB — PROTIME-INR
INR: 0.9 (ref 0.8–1.2)
Prothrombin Time: 12.1 seconds (ref 11.4–15.2)

## 2022-09-18 MED ORDER — MIDAZOLAM HCL 2 MG/2ML IJ SOLN
INTRAMUSCULAR | Status: AC
  Filled 2022-09-18: qty 2

## 2022-09-18 MED ORDER — MIDAZOLAM HCL 2 MG/2ML IJ SOLN
INTRAMUSCULAR | Status: AC | PRN
  Administered 2022-09-18 (×2): 1 mg via INTRAVENOUS
  Administered 2022-09-18: .5 mg via INTRAVENOUS

## 2022-09-18 MED ORDER — IOHEXOL 300 MG/ML  SOLN
50.0000 mL | Freq: Once | INTRAMUSCULAR | Status: AC | PRN
  Administered 2022-09-18: 8 mL

## 2022-09-18 MED ORDER — SODIUM CHLORIDE 0.9 % IV SOLN: INTRAVENOUS | Status: AC

## 2022-09-18 MED ORDER — FENTANYL CITRATE (PF) 100 MCG/2ML IJ SOLN
INTRAMUSCULAR | Status: AC
  Filled 2022-09-18: qty 2

## 2022-09-18 MED ORDER — VANCOMYCIN HCL IN DEXTROSE 1-5 GM/200ML-% IV SOLN
INTRAVENOUS | Status: AC | PRN
  Administered 2022-09-18: 1000 mg via INTRAVENOUS

## 2022-09-18 MED ORDER — VANCOMYCIN HCL IN DEXTROSE 1-5 GM/200ML-% IV SOLN: 1000.0000 mg | INTRAVENOUS | Status: DC

## 2022-09-18 MED ORDER — VANCOMYCIN HCL IN DEXTROSE 1-5 GM/200ML-% IV SOLN
INTRAVENOUS | Status: AC
  Filled 2022-09-18: qty 200

## 2022-09-18 MED ORDER — SODIUM CHLORIDE 0.9 % IV SOLN: INTRAVENOUS | Status: DC

## 2022-09-18 MED ORDER — TOBRAMYCIN SULFATE 1.2 G IJ SOLR
INTRAMUSCULAR | Status: AC
  Administered 2022-09-18: 0.05 g
  Filled 2022-09-18: qty 1.2

## 2022-09-18 MED ORDER — FENTANYL CITRATE (PF) 100 MCG/2ML IJ SOLN
INTRAMUSCULAR | Status: AC | PRN
  Administered 2022-09-18: 12.5 ug via INTRAVENOUS
  Administered 2022-09-18: 50 ug via INTRAVENOUS

## 2022-09-18 MED ORDER — BUPIVACAINE HCL (PF) 0.5 % IJ SOLN
INTRAMUSCULAR | Status: AC
  Administered 2022-09-18: 20 mL via INTRADERMAL
  Filled 2022-09-18: qty 30

## 2022-09-18 NOTE — H&P (Signed)
Chief Complaint: Patient was seen in consultation today for closed compression fracture of L2 lumbar vertebra  Referring Physician(s): Deveshwar,Sanjeev  Supervising Physician: Luanne Bras  Patient Status: [facility] - Out-pt  History of Present Illness: [name] [name] is a 72 y.o. female with PMH including anemia, anxiety, alcohol dependence, lung cancer, and hypertension being seen today in relation to a closed compression fracture of L2 lumbar vertebra. The patient reports a history of falling in December which caused symptoms of lumbar radiculopathy. CT imaging performed on 09/06/22 and 09/08/22 revealed a superior endplate compression fracture of L2 vertebra with 35% height loss. The patient has reported pain, numbness, and pins and needle sensations bilaterally in her lower extremities. She reports these symptoms have significantly affected her ability to perform activities of daily living and that she has had to hire a caretaker due to the impact on her life.    Past Medical History:  Diagnosis Date   Anemia    duering cancer treatment    Anxiety    Arthritis    OA hands    Bilateral lower extremity pain    Blood transfusion without reported diagnosis    during chemo for lung cancer   Cataract    forming  but very small    Cholelithiasis    Colon polyps    adenomatous   Concussion 08/25/2021   ETOHdependence ([place])    High cholesterol    History of chemotherapy    for lung cancer- completed 2013   Hx of radiation therapy 10/07/11 to 11/20/11   right lung   Hx of radiation therapy 01/07/2012- 01/21/12   cranial irradiation   Hypertension    Lung cancer ([place]) 09/19/2011   Stage III currently in remission   Malignant melanoma of skin of canthus of right eye ([place]) 02/2017   Osteoporosis 04/2014   Dexa scan by Dr. Corinna Capra, T-score -2.7, next in 2 years, with at least 13 % major fracture in 10 years   Pitting edema    Bilateral lower extremities, duplex ultrasound  04/03/2015 normal, no DVT    Past Surgical History:  Procedure Laterality Date   CHOLECYSTECTOMY  04/2011   biliary stent placement   CLOSED REDUCTION FINGER WITH PERCUTANEOUS PINNING Left 10/23/2021   Procedure: LEFT INDEX, MIDDLE, RING, AND SMALL FINGER CLOSED REDUCTION FINGER WITH PERCUTANEOUS PINNING;  Surgeon: Sherilyn Cooter, MD;  Location: [facility];  Service: Orthopedics;  Laterality: Left;   COLONOSCOPY     INCISIONAL HERNIA REPAIR  2015   MOHS SURGERY Right 02/2017   cheek and eyelid   Josph Macho Touch     Vaginal procedure   POLYPECTOMY     TUBAL LIGATION      Allergies: Tramadol hcl, Codeine, and Penicillins  Medications: Prior to Admission medications   Medication Sig Start Date End Date Taking? Authorizing Provider  acetaminophen (TYLENOL) 500 MG tablet Take 500 mg by mouth every 6 (six) hours as needed. Patient not taking: Reported on 12/16/2021    [provider]  aspirin EC 81 MG tablet Take 81 mg daily by mouth.    [provider]  carvedilol (COREG) 6.25 MG tablet Take 1 tablet (6.25 mg total) by mouth 2 (two) times daily with a meal. 10/18/21   Colon Branch, MD  Cholecalciferol (VITAMIN D3) 2000 UNITS capsule Take 4,000 Units by mouth daily.  Patient not taking: Reported on 11/05/2021    [provider]  citalopram (CELEXA) 20 MG tablet Take 1 tablet (  20 mg total) by mouth daily. 07/02/22   Colon Branch, MD  cyanocobalamin 1000 MCG tablet Take 2 tablets by mouth daily. Patient not taking: Reported on 12/16/2021 09/03/21   Love, Ivan Anchors, PA-C  Evolocumab (REPATHA SURECLICK) 784 MG/ML SOAJ INJECT 140MG  UNDER THE SKIN ONCE EVERY 14 DAYS 08/20/22   Hilty, Nadean Corwin, MD  folic acid (FOLVITE) 1 MG tablet Take 1 tablet (1 mg total) by mouth daily. Patient not taking: Reported on 11/05/2021 09/03/21   Love, Ivan Anchors, PA-C  raloxifene (EVISTA) 60 MG tablet Take 1 tablet (60 mg total) by mouth daily. 04/03/20   Pixie Casino, MD   rosuvastatin (CRESTOR) 40 MG tablet TAKE 1 TABLET(40 MG) BY MOUTH AT BEDTIME 12/30/21   Hilty, Nadean Corwin, MD  thiamine 100 MG tablet Take 1 tablet (100 mg total) by mouth daily. Patient not taking: Reported on 11/05/2021 09/04/21   Love, Ivan Anchors, PA-C     Family History  Problem Relation Age of Onset   Bladder Cancer Father    Stroke Father    Colon polyps Father    Atrial fibrillation Mother    Heart disease Mother        CHF   Clotting disorder Mother        warfarin   Colon polyps Mother    Colon cancer Maternal Aunt 59   Drug abuse Son    Breast cancer Neg Hx    Esophageal cancer Neg Hx    Stomach cancer Neg Hx    Rectal cancer Neg Hx     Social History   Socioeconomic History   Marital status: Divorced    Spouse name: Not on file   Number of children: 2   Years of education: Not on file   Highest education level: Not on file  Occupational History   Occupation: retired, Scientist, research (medical)  Tobacco Use   Smoking status: Former    Types: Cigarettes    Quit date: 09/08/2011    Years since quitting: 11.0   Smokeless tobacco: Never  Vaping Use   Vaping Use: Never used  Substance and Sexual Activity   Alcohol use: Yes    Comment: daily bottle of wine   Drug use: No   Sexual activity: Not on file  Other Topics Concern   Not on file  Social History Narrative   Divorced   Lost 1 child June 2019 (drugs)   Living child w/ drug addiction   Lives by herself       Social Determinants of Health   Financial Resource Strain: Not on file  Food Insecurity: Not on file  Transportation Needs: Not on file  Physical Activity: Not on file  Stress: Not on file  Social Connections: Not on file     Review of Systems: A 12 point ROS discussed and pertinent positives are indicated in the HPI above.  All other systems are negative.  Review of Systems  Constitutional:  Negative for chills and fever.  Respiratory:  Negative for chest tightness and shortness of breath.   Cardiovascular:   Negative for chest pain and leg swelling.  Gastrointestinal:  Negative for diarrhea, nausea and vomiting.  Musculoskeletal:  Positive for back pain.  Neurological:  Negative for dizziness and headaches.  Psychiatric/Behavioral:  Negative for confusion.     Vital Signs: BP (!) 141/103   Pulse 96   Temp (!) 97.3 F (36.3 C)   Resp 13   Ht 5\' 3"  (1.6 m)   Wt  156 lb (70.8 kg)   SpO2 99%   BMI 27.63 kg/m    Physical Exam HENT:     Mouth/Throat:     Mouth: Mucous membranes are moist.  Cardiovascular:     Rate and Rhythm: Normal rate and regular rhythm.     Pulses: Normal pulses.     Heart sounds: Normal heart sounds.  Pulmonary:     Effort: Pulmonary effort is normal.     Breath sounds: Normal breath sounds.  Musculoskeletal:     Right lower leg: No edema.     Left lower leg: No edema.  Skin:    General: Skin is warm and dry.  Neurological:     Mental Status: She is alert and oriented to person, place, and time.  Psychiatric:        Mood and Affect: Mood normal.        Behavior: Behavior normal.     Imaging: CT Lumbar Spine Wo Contrast  Result Date: 09/08/2022 CLINICAL DATA:  Initial evaluation for acute trauma. EXAM: CT LUMBAR SPINE WITHOUT CONTRAST TECHNIQUE: Multidetector CT imaging of the lumbar spine was performed without intravenous contrast administration. Multiplanar CT image reconstructions were also generated. RADIATION DOSE REDUCTION: This exam was performed according to the departmental dose-optimization program which includes automated exposure control, adjustment of the mA and/or kV according to patient size and/or use of iterative reconstruction technique. COMPARISON:  CT from 09/06/2022. FINDINGS: Segmentation: Standard. Lowest well-formed disc space labeled the L5-S1 level. Alignment: Physiologic with preservation of the normal lumbar lordosis. No listhesis. Vertebrae: Previously identified acute compression fracture involving the superior endplate of L2  again seen. Associated height loss is relatively similar measuring up to 35% with trace 2 mm bony retropulsion. Otherwise, vertebral body height maintained with no other acute or interval fracture. Remote appearing fractures of the right posterior eleventh rib noted. No worrisome osseous lesions. Paraspinal and other soft tissues: Paraspinous soft tissues demonstrate no acute finding. Aortic atherosclerosis. Prior cholecystectomy. Disc levels: Minor for age degenerative disc disease within the lower thoracic spine. Lower lumbar facet hypertrophy. No significant spinal stenosis by CT. IMPRESSION: 1. Acute compression fracture involving the superior endplate of L2 with associated height loss measuring up to 35%. Overall, appearance is relatively similar to prior. 2. No other acute osseous injury within the lumbar spine. 3. Remote appearing fractures of the right posterior eleventh rib. Aortic Atherosclerosis (ICD10-I70.0). Electronically Signed   By: Jeannine Boga M.D.   On: 09/08/2022 00:07   CT Lumbar Spine Wo Contrast  Result Date: 09/06/2022 CLINICAL DATA:  Lumbar radiculopathy.  Mechanical fall 1 month ago. EXAM: CT LUMBAR SPINE WITHOUT CONTRAST TECHNIQUE: Multidetector CT imaging of the lumbar spine was performed without intravenous contrast administration. Multiplanar CT image reconstructions were also generated. RADIATION DOSE REDUCTION: This exam was performed according to the departmental dose-optimization program which includes automated exposure control, adjustment of the mA and/or kV according to patient size and/or use of iterative reconstruction technique. COMPARISON:  X-ray 07/21/2022 FINDINGS: Segmentation: 5 lumbar type vertebrae. Alignment: Normal. Vertebrae: Acute superior endplate compression fracture of the L2 vertebral body with up to 35% vertebral body height loss centrally. No significant bony retropulsion. No fracture extension into the posterior elements. Remaining lumbar  vertebral body heights are maintained. No additional fractures. No lytic or sclerotic bone lesion. Paraspinal and other soft tissues: Aortic atherosclerosis. No acute findings. Disc levels: Intervertebral disc heights are preserved. No significant facet arthropathy. IMPRESSION: 1. Acute superior endplate compression fracture of the L2 vertebral  body with up to 35% vertebral body height loss centrally. No significant bony retropulsion. 2. Aortic atherosclerosis (ICD10-I70.0). Electronically Signed   By: Davina Poke D.O.   On: 09/06/2022 17:16    Labs:  CBC: Recent Labs    12/16/21 1507 01/28/22 1434 09/18/22 1016  WBC 7.1 6.8 9.7  HGB 12.5 12.9 12.8  HCT 37.3 38.1 39.2  PLT 210.0 227 201    COAGS: Recent Labs    09/18/22 1016  INR 0.9    BMP: Recent Labs    12/16/21 1507 01/28/22 1434 09/18/22 1016  NA 138 139 139  K 4.1 3.6 3.7  CL 102 104 105  CO2 26 26 23   GLUCOSE 88 140* 110*  BUN 16 13 15   CALCIUM 9.6 9.5 9.1  CREATININE 0.86 0.87 0.84  GFRNONAA  --  >60 >60    LIVER FUNCTION TESTS: Recent Labs    12/16/21 1507 01/28/22 1434  BILITOT 0.3 0.3  AST 18 23  ALT 11 12  ALKPHOS 54 55  PROT 7.0 6.9  ALBUMIN 4.5 4.2    TUMOR MARKERS: No results for input(s): "AFPTM", "CEA", "CA199", "CHROMGRNA" in the last 8760 hours.  Assessment and Plan:   Closed compression fracture of L2 lumbar vertebra  Patient has been evaluated and found to be a candidate for image-guided kyphoplasty of L2 vertebra. The patient presents today in their usual state of health. They report being NPO and that they do not take blood thinners. The case has been reviewed with Dr Estanislado Pandy and is scheduled to proceed on 09/18/22.   Per Malachi Bonds discussion with Dr Ron Agee, no biopsy is required during this procedure.  Risks and benefits of L2 kyphoplasty were discussed with the patient including, but not limited to education regarding the natural healing process of compression  fractures without intervention, bleeding, infection, cement migration which may cause spinal cord damage, paralysis, pulmonary embolism or even death.  This interventional procedure involves the use of X-rays and because of the nature of the planned procedure, it is possible that we will have prolonged use of X-ray fluoroscopy.  Potential radiation risks to you include (but are not limited to) the following: - A slightly elevated risk for cancer  several years later in life. This risk is typically less than 0.5% percent. This risk is low in comparison to the normal incidence of human cancer, which is 33% for women and 50% for men according to the [place]. - Radiation induced injury can include skin redness, resembling a rash, tissue breakdown / ulcers and hair loss (which can be temporary or permanent).   The likelihood of either of these occurring depends on the difficulty of the procedure and whether you are sensitive to radiation due to previous procedures, disease, or genetic conditions.   IF your procedure requires a prolonged use of radiation, you will be notified and given written instructions for further action.  It is your responsibility to monitor the irradiated area for the 2 weeks following the procedure and to notify your physician if you are concerned that you have suffered a radiation induced injury.    All of the patient's questions were answered, patient is agreeable to proceed.  Consent signed and in chart.     Thank you for this interesting consult.  I greatly enjoyed meeting [name] [name] and look forward to participating in their care.  A copy of this report was sent to the requesting provider on this date.  Electronically Signed: Rodman Key  Drucie Ip, PA-C 09/18/2022, 11:00 AM   I spent a total of 40 Minutes   in face to face in clinical consultation, greater than 50% of which was counseling/coordinating care for closed L2 compression  fracture

## 2022-09-18 NOTE — Discharge Instructions (Signed)
INR.  1.  No stooping, bending, or lifting weights 10 pounds for 2 weeks.  2.  Use a walker to ambulate for 2 weeks.  3.  No driving for 2 weeks.  4.  Follow up with referring MD in 2 weeks.

## 2022-09-18 NOTE — Procedures (Signed)
INR.  Status post L2 balloon kyphoplasty.  Right transpedicular approach.  No acute complications.  Arlean Hopping MD

## 2022-09-18 NOTE — Progress Notes (Signed)
Patient and friend was given discharge instructions. Both verbalized understanding. 

## 2022-09-18 NOTE — Telephone Encounter (Signed)
[name] (sister DPR OK) called stating pt is having issues with burning bladder/urethra when she urinates and was wondering if Dr. Larose Kells could call her in anything. She stated pt had just gotten surgery and is unable to go anywhere to be evaluated. Per conversation with Vilma Prader, advised that the only way to get this addressed in a timely matter would be to take her to an urgent care as Dr. Larose Kells would not prescribe anything without knowing what he is dealing with. [name] stated that she is in no condition to go anywhere and would like to see if Dr. Larose Kells could call in something for her. Advised a note would be sent back to look into this but it may be until tomorrow until he sees it due to him being gone for the day. [name] acknowledged understanding and expressed thanks for this action being taken.

## 2022-09-19 ENCOUNTER — Telehealth: Payer: Self-pay

## 2022-09-19 ENCOUNTER — Other Ambulatory Visit: Payer: Medicare Other

## 2022-09-19 DIAGNOSIS — R399 Unspecified symptoms and signs involving the genitourinary system: Secondary | ICD-10-CM | POA: Diagnosis not present

## 2022-09-19 MED ORDER — SULFAMETHOXAZOLE-TRIMETHOPRIM 800-160 MG PO TABS: 1.0000 | ORAL_TABLET | Freq: Two times a day (BID) | ORAL | 0 refills | Status: DC

## 2022-09-19 NOTE — Telephone Encounter (Signed)
Spoke with Kem Kays patient's sister informed her that I spoke with Dr. Larose Kells and we will send out the urine cx only and he will send something in for patient. [name] [name] verbalized understanding and will contact office if patient is doing better by Monday.

## 2022-09-19 NOTE — Telephone Encounter (Signed)
We were provided with enough urine for only a urine culture. Plan: Send Rx for Bactrim DS 1 p.o. twice daily, number 6 tablets no refills

## 2022-09-19 NOTE — Telephone Encounter (Signed)
Spoke w/ [name]- informed cup at front desk to pick up at her convenience. To bring back ASAP.

## 2022-09-19 NOTE — Telephone Encounter (Signed)
Rx sent. Spoke w/ [name]- informed that Rx sent

## 2022-09-19 NOTE — Telephone Encounter (Signed)
[name] [name] picked up the urine sample container.

## 2022-09-19 NOTE — Telephone Encounter (Signed)
I also spoke with [name], reportedly Yulitza is not having fever or chills.  She had a kyphoplasty and is in a lot of pain

## 2022-09-19 NOTE — Addendum Note (Signed)
Addended byDamita Dunnings D on: 09/19/2022 03:34 PM   Modules accepted: Orders

## 2022-09-19 NOTE — Telephone Encounter (Signed)
Recommend the sister to pick up a sterile container and bring  a urine sample for UA urine culture. (If unable to let me know)

## 2022-09-21 LAB — URINE CULTURE
MICRO NUMBER:: 14478706
SPECIMEN QUALITY:: ADEQUATE

## 2022-09-23 ENCOUNTER — Ambulatory Visit: Payer: Medicare Other | Admitting: Internal Medicine

## 2022-09-29 ENCOUNTER — Other Ambulatory Visit: Payer: Self-pay

## 2022-09-29 ENCOUNTER — Emergency Department (HOSPITAL_BASED_OUTPATIENT_CLINIC_OR_DEPARTMENT_OTHER)
Admission: EM | Admit: 2022-09-29 | Discharge: 2022-09-29 | Disposition: A | Discharge status: Home / Self Care | Payer: Medicare Other | Attending: Emergency Medicine | Admitting: Emergency Medicine

## 2022-09-29 ENCOUNTER — Encounter (HOSPITAL_BASED_OUTPATIENT_CLINIC_OR_DEPARTMENT_OTHER): Payer: Self-pay | Admitting: Emergency Medicine

## 2022-09-29 ENCOUNTER — Emergency Department (HOSPITAL_BASED_OUTPATIENT_CLINIC_OR_DEPARTMENT_OTHER): Payer: Medicare Other

## 2022-09-29 DIAGNOSIS — R0789 Other chest pain: Secondary | ICD-10-CM | POA: Diagnosis not present

## 2022-09-29 DIAGNOSIS — J929 Pleural plaque without asbestos: Secondary | ICD-10-CM | POA: Diagnosis not present

## 2022-09-29 DIAGNOSIS — J398 Other specified diseases of upper respiratory tract: Secondary | ICD-10-CM | POA: Diagnosis not present

## 2022-09-29 DIAGNOSIS — Z7982 Long term (current) use of aspirin: Secondary | ICD-10-CM | POA: Insufficient documentation

## 2022-09-29 DIAGNOSIS — S2242XA Multiple fractures of ribs, left side, initial encounter for closed fracture: Secondary | ICD-10-CM | POA: Diagnosis not present

## 2022-09-29 DIAGNOSIS — I10 Essential (primary) hypertension: Secondary | ICD-10-CM | POA: Insufficient documentation

## 2022-09-29 DIAGNOSIS — Z85118 Personal history of other malignant neoplasm of bronchus and lung: Secondary | ICD-10-CM | POA: Diagnosis not present

## 2022-09-29 DIAGNOSIS — Z85820 Personal history of malignant melanoma of skin: Secondary | ICD-10-CM | POA: Insufficient documentation

## 2022-09-29 DIAGNOSIS — W1830XA Fall on same level, unspecified, initial encounter: Secondary | ICD-10-CM | POA: Insufficient documentation

## 2022-09-29 DIAGNOSIS — Y92009 Unspecified place in unspecified non-institutional (private) residence as the place of occurrence of the external cause: Secondary | ICD-10-CM | POA: Insufficient documentation

## 2022-09-29 DIAGNOSIS — W19XXXA Unspecified fall, initial encounter: Secondary | ICD-10-CM

## 2022-09-29 DIAGNOSIS — Y9301 Activity, walking, marching and hiking: Secondary | ICD-10-CM | POA: Insufficient documentation

## 2022-09-29 DIAGNOSIS — R079 Chest pain, unspecified: Secondary | ICD-10-CM | POA: Diagnosis not present

## 2022-09-29 DIAGNOSIS — S299XXA Unspecified injury of thorax, initial encounter: Secondary | ICD-10-CM | POA: Diagnosis present

## 2022-09-29 DIAGNOSIS — J984 Other disorders of lung: Secondary | ICD-10-CM | POA: Diagnosis not present

## 2022-09-29 MED ORDER — LIDOCAINE 5 % EX PTCH: 1.0000 | MEDICATED_PATCH | CUTANEOUS | Status: DC

## 2022-09-29 MED ORDER — ACETAMINOPHEN 500 MG PO TABS
1000.0000 mg | ORAL_TABLET | Freq: Once | ORAL | Status: AC
  Administered 2022-09-29: 1000 mg via ORAL
  Filled 2022-09-29: qty 2

## 2022-09-29 MED ORDER — LIDOCAINE 4 % EX CREA
TOPICAL_CREAM | Freq: Once | CUTANEOUS | Status: AC
  Administered 2022-09-29: 1 via TOPICAL
  Filled 2022-09-29: qty 5

## 2022-09-29 MED ORDER — OXYCODONE HCL 5 MG PO TABS: 5.0000 mg | ORAL_TABLET | Freq: Four times a day (QID) | ORAL | 0 refills | Status: AC | PRN

## 2022-09-29 NOTE — ED Triage Notes (Signed)
BIB [facility] due to mechanical fall yesterday. Pt states she fell on L side. Denies hitting her head or LOC. States she hit her L shoulder and ribs/flank. Per EMS pt has bruising to L flank area. Pt alert and oriented. NAD.

## 2022-09-29 NOTE — ED Notes (Signed)
Pt understood discharge orders verbally.

## 2022-09-29 NOTE — ED Provider Notes (Signed)
[facility] Provider Note  CSN: [id number] Arrival date & time: 09/29/22  0703  History  Chief Complaint  Patient presents with   [name] [name] is a 72 y.o. female.  [name] [name] is a 72 year old woman who presents via EMS this morning for a fall that occurred 3 PM yesterday.  She reports she was walking in her home and turned a corner too fast.  The walker slid out from underneath her and she fell against a Hutch, injuring her left ribs.  She reports she fell to the ground.  Denies dizziness, presyncope, LOC.  Reports she did not hit her head or injure any other body part.  Did not fall onto outstretched hands.  She is taking aspirin no other blood thinners.  Drinks 2 to 3 glasses of wine most days.  Remote history of tobacco use, quit about 10 years ago.  No other illicit substances including marijuana.  Pertinent medical history was osteoporosis, lung cancer s/p treatment, malignant melanoma, DJD, IBS-D, pelvic fracture, finger fracture, HTN, HLD, GAD.   Caregiver is at bedside and helps provide history.  Last took Tylenol around midnight.  Took a home oxycodone around 630 or before calling EMS.  Patient reports she called EMS because the pain was worsening and affecting her ability to walk or move.   Home Medications Prior to Admission medications   Medication Sig Start Date End Date Taking? Authorizing Provider  oxyCODONE (ROXICODONE) 5 MG immediate release tablet Take 1 tablet (5 mg total) by mouth every 6 (six) hours as needed for up to 3 days for severe pain. 09/29/22 10/02/22 Yes Ezequiel Essex, MD  acetaminophen (TYLENOL) 500 MG tablet Take 500 mg by mouth every 6 (six) hours as needed.    [provider]  aspirin EC 81 MG tablet Take 81 mg daily by mouth.    [provider]  carvedilol (COREG) 6.25 MG tablet Take 1 tablet (6.25 mg total) by mouth 2 (two) times daily with a meal. 10/18/21   Colon Branch,  MD  Cholecalciferol (VITAMIN D3) 2000 UNITS capsule Take 4,000 Units by mouth daily.  Patient not taking: Reported on 11/05/2021    [provider]  citalopram (CELEXA) 20 MG tablet Take 1 tablet (20 mg total) by mouth daily. 07/02/22   Colon Branch, MD  cyanocobalamin 1000 MCG tablet Take 2 tablets by mouth daily. Patient not taking: Reported on 12/16/2021 09/03/21   Love, Ivan Anchors, PA-C  Evolocumab (REPATHA SURECLICK) 027 MG/ML SOAJ INJECT 140MG  UNDER THE SKIN ONCE EVERY 14 DAYS 08/20/22   Hilty, Nadean Corwin, MD  folic acid (FOLVITE) 1 MG tablet Take 1 tablet (1 mg total) by mouth daily. Patient not taking: Reported on 11/05/2021 09/03/21   Love, Ivan Anchors, PA-C  raloxifene (EVISTA) 60 MG tablet Take 1 tablet (60 mg total) by mouth daily. 04/03/20   Pixie Casino, MD  rosuvastatin (CRESTOR) 40 MG tablet TAKE 1 TABLET(40 MG) BY MOUTH AT BEDTIME 12/30/21   Hilty, Nadean Corwin, MD  sulfamethoxazole-trimethoprim (BACTRIM DS) 800-160 MG tablet Take 1 tablet by mouth 2 (two) times daily. 09/19/22   Colon Branch, MD  thiamine 100 MG tablet Take 1 tablet (100 mg total) by mouth daily. Patient not taking: Reported on 11/05/2021 09/04/21   Bary Leriche, PA-C     Allergies    Tramadol hcl, Codeine, and Penicillins    Review of Systems   Review of Systems  Constitutional:  Negative for diaphoresis, fatigue and fever.  Musculoskeletal:  Positive for back pain and gait problem.  Skin:  Positive for wound (Bruising).  Neurological:  Negative for dizziness, tremors, seizures, syncope and light-headedness.   Physical Exam Updated Vital Signs BP (!) 165/98 (BP Location: Right Arm)   Pulse 100   Temp 98.9 F (37.2 C) (Oral)   Resp 20   Ht 5\' 3"  (1.6 m)   Wt 70.8 kg   SpO2 97%   BMI 27.63 kg/m  Physical Exam Vitals and nursing note reviewed.  Constitutional:      General: She is not in acute distress.    Appearance: Normal appearance. She is normal weight. She is not ill-appearing or  toxic-appearing.  HENT:     Head: Normocephalic and atraumatic.     Nose: Nose normal.  Eyes:     Extraocular Movements: Extraocular movements intact.  Cardiovascular:     Rate and Rhythm: Normal rate and regular rhythm.     Pulses: Normal pulses.     Heart sounds: Normal heart sounds. No murmur heard. Pulmonary:     Effort: Pulmonary effort is normal. No respiratory distress.     Breath sounds: Normal breath sounds. No wheezing or rales.  Chest:     Chest wall: Tenderness (Along left mid axillary ribs, third through fifth) present.  Abdominal:     General: Abdomen is flat.  Musculoskeletal:     Cervical back: Normal range of motion and neck supple. No rigidity or tenderness.  Skin:    General: Skin is warm.     Capillary Refill: Capillary refill takes less than 2 seconds.  Neurological:     General: No focal deficit present.     Mental Status: She is alert and oriented to person, place, and time.    ED Results / Procedures / Treatments   Labs (all labs ordered are listed, but only abnormal results are displayed) Labs Reviewed - No data to display  EKG None  Radiology DG Ribs Unilateral W/Chest Left  Result Date: 09/29/2022 CLINICAL DATA:  Fall onto left side. EXAM: LEFT RIBS AND CHEST - 3+ VIEW COMPARISON:  08/25/2021 and CT chest 01/28/2022. FINDINGS: Frontal view of the chest shows rightward deviation of the trachea, as on CT chest 01/28/2022. Heart size normal. Right upper lobe scarring. Biapical pleural thickening. Lungs are otherwise clear. Acute nondisplaced fractures of the left sixth and seventh posterolateral ribs. IMPRESSION: 1. Acute nondisplaced fractures of the left sixth and seventh posterolateral ribs. 2. Otherwise, no acute findings. Electronically Signed   By: Lorin Picket M.D.   On: 09/29/2022 08:04    Procedures Procedures   Medications Ordered in ED Medications  lidocaine (LIDODERM) 5 % 1 patch (has no administration in time range)  acetaminophen  (TYLENOL) tablet 1,000 mg (1,000 mg Oral Given 09/29/22 0802)  lidocaine (LMX) 4 % cream (1 Application Topical Given 09/29/22 0806)   ED Course/ Medical Decision Making/ A&P  Medical Decision Making 72 year old woman presenting approximately 14 hours after fall with injury to left mid axillary ribs.  Worsening pain and bruising.  Denies head trauma, presyncope, LOC.  No red flags, strength and sensation intact.  Provided with Tylenol and lidocaine cream to painful rib area.  Ordered x-ray of left ribs.  8:15 am: XR demonstrates acute nondisplaced fractures of the left sixth and seventh posterolateral ribs. Counseled patient. Rx lidocaine 5% patches and #12 tablets oxy IR 5 mg. Recommend follow up with PCP.    Amount  and/or Complexity of Data Reviewed Independent Historian: caregiver Radiology: ordered.    Details: XR ribs left  Risk OTC drugs. Prescription drug management.   Final Clinical Impression(s) / ED Diagnoses Final diagnoses:  Closed fracture of multiple ribs of left side, initial encounter  Fall, initial encounter   Rx / DC Orders ED Discharge Orders          Ordered    oxyCODONE (ROXICODONE) 5 MG immediate release tablet  Every 6 hours PRN        09/29/22 0815           Ezequiel Essex, MD    Ezequiel Essex, MD 09/29/22 3710    Margette Fast, MD 10/08/22 1031

## 2022-09-29 NOTE — Discharge Instructions (Addendum)
Looks like you fractured a couple ribs on the left, where your bruising and pain is.   No surgery needed for these, as the rib muscles hold the ribs in place well.   We sent a prescription of oxycodone to your pharmacy.  Use lidocaine patches, apply to painful ribs area every 24 hours.   You have thin bones that are easy to fracture.  - Continue working with your PCP on strategies and medicines to strengthen your bones - We recommend supervised weight bearing exercises to help - Physical therapy can help improve your stability on your feet - Reduce how much you drink or stop drinking altogether - You cannot take more than 4,000 mg of Tylenol in a 24 hour period.   Follow up with your primary care doctor.

## 2022-10-15 DIAGNOSIS — M545 Low back pain, unspecified: Secondary | ICD-10-CM | POA: Diagnosis not present

## 2022-10-21 ENCOUNTER — Encounter: Payer: Self-pay | Admitting: Internal Medicine

## 2022-10-22 ENCOUNTER — Ambulatory Visit: Payer: Medicare Other | Admitting: Internal Medicine

## 2022-10-30 DIAGNOSIS — R300 Dysuria: Secondary | ICD-10-CM | POA: Diagnosis not present

## 2022-10-30 DIAGNOSIS — N3001 Acute cystitis with hematuria: Secondary | ICD-10-CM | POA: Diagnosis not present

## 2022-11-29 ENCOUNTER — Other Ambulatory Visit: Payer: Self-pay | Admitting: Internal Medicine

## 2022-12-04 DIAGNOSIS — M545 Low back pain, unspecified: Secondary | ICD-10-CM | POA: Diagnosis not present

## 2022-12-05 ENCOUNTER — Encounter: Payer: Self-pay | Admitting: Internal Medicine

## 2022-12-10 DIAGNOSIS — S32020A Wedge compression fracture of second lumbar vertebra, initial encounter for closed fracture: Secondary | ICD-10-CM | POA: Diagnosis not present

## 2022-12-17 ENCOUNTER — Ambulatory Visit (HOSPITAL_BASED_OUTPATIENT_CLINIC_OR_DEPARTMENT_OTHER): Payer: Medicare Other | Admitting: Internal Medicine

## 2022-12-17 ENCOUNTER — Emergency Department (HOSPITAL_BASED_OUTPATIENT_CLINIC_OR_DEPARTMENT_OTHER): Payer: Medicare Other

## 2022-12-17 ENCOUNTER — Encounter (HOSPITAL_BASED_OUTPATIENT_CLINIC_OR_DEPARTMENT_OTHER): Payer: Self-pay | Admitting: Emergency Medicine

## 2022-12-17 ENCOUNTER — Emergency Department (HOSPITAL_BASED_OUTPATIENT_CLINIC_OR_DEPARTMENT_OTHER)
Admission: EM | Admit: 2022-12-17 | Discharge: 2022-12-18 | Disposition: A | Discharge status: Home / Self Care | Payer: Medicare Other | Attending: Emergency Medicine | Admitting: Emergency Medicine

## 2022-12-17 DIAGNOSIS — M549 Dorsalgia, unspecified: Secondary | ICD-10-CM | POA: Diagnosis not present

## 2022-12-17 DIAGNOSIS — R102 Pelvic and perineal pain unspecified side: Secondary | ICD-10-CM

## 2022-12-17 DIAGNOSIS — L02225 Furuncle of perineum: Secondary | ICD-10-CM | POA: Insufficient documentation

## 2022-12-17 DIAGNOSIS — R233 Spontaneous ecchymoses: Secondary | ICD-10-CM

## 2022-12-17 DIAGNOSIS — R300 Dysuria: Secondary | ICD-10-CM | POA: Diagnosis not present

## 2022-12-17 DIAGNOSIS — S199XXA Unspecified injury of neck, initial encounter: Secondary | ICD-10-CM | POA: Diagnosis not present

## 2022-12-17 DIAGNOSIS — Z7982 Long term (current) use of aspirin: Secondary | ICD-10-CM | POA: Insufficient documentation

## 2022-12-17 DIAGNOSIS — I700 Atherosclerosis of aorta: Secondary | ICD-10-CM | POA: Insufficient documentation

## 2022-12-17 DIAGNOSIS — M545 Low back pain, unspecified: Secondary | ICD-10-CM | POA: Diagnosis not present

## 2022-12-17 DIAGNOSIS — Z79899 Other long term (current) drug therapy: Secondary | ICD-10-CM | POA: Insufficient documentation

## 2022-12-17 DIAGNOSIS — M47816 Spondylosis without myelopathy or radiculopathy, lumbar region: Secondary | ICD-10-CM | POA: Diagnosis not present

## 2022-12-17 DIAGNOSIS — K5730 Diverticulosis of large intestine without perforation or abscess without bleeding: Secondary | ICD-10-CM | POA: Diagnosis not present

## 2022-12-17 DIAGNOSIS — W19XXXA Unspecified fall, initial encounter: Secondary | ICD-10-CM | POA: Diagnosis not present

## 2022-12-17 DIAGNOSIS — R296 Repeated falls: Secondary | ICD-10-CM | POA: Diagnosis not present

## 2022-12-17 DIAGNOSIS — R531 Weakness: Secondary | ICD-10-CM | POA: Insufficient documentation

## 2022-12-17 LAB — CBC WITH DIFFERENTIAL/PLATELET
Abs Immature Granulocytes: 0.03 10*3/uL (ref 0.00–0.07)
Basophils Absolute: 0.1 10*3/uL (ref 0.0–0.1)
Basophils Relative: 1 %
Eosinophils Absolute: 0.1 10*3/uL (ref 0.0–0.5)
Eosinophils Relative: 1 %
HCT: 40 % (ref 36.0–46.0)
Hemoglobin: 13 g/dL (ref 12.0–15.0)
Immature Granulocytes: 1 %
Lymphocytes Relative: 7 %
Lymphs Abs: 0.4 10*3/uL — ABNORMAL LOW (ref 0.7–4.0)
MCH: 33.4 pg (ref 26.0–34.0)
MCHC: 32.5 g/dL (ref 30.0–36.0)
MCV: 102.8 fL — ABNORMAL HIGH (ref 80.0–100.0)
Monocytes Absolute: 0.5 10*3/uL (ref 0.1–1.0)
Monocytes Relative: 9 %
Neutro Abs: 4.8 10*3/uL (ref 1.7–7.7)
Neutrophils Relative %: 81 %
Platelets: 228 10*3/uL (ref 150–400)
RBC: 3.89 MIL/uL (ref 3.87–5.11)
RDW: 11.8 % (ref 11.5–15.5)
WBC: 5.8 10*3/uL (ref 4.0–10.5)
nRBC: 0 % (ref 0.0–0.2)

## 2022-12-17 LAB — URINALYSIS, MICROSCOPIC (REFLEX): WBC, UA: >50 WBC/hpf (ref 0–5)

## 2022-12-17 LAB — URINALYSIS, ROUTINE W REFLEX MICROSCOPIC
Glucose, UA: NEGATIVE mg/dL
Ketones, ur: NEGATIVE mg/dL
Nitrite: NEGATIVE
Protein, ur: 100 mg/dL — AB
Specific Gravity, Urine: >=1.03 (ref 1.005–1.030)
pH: 5.5 (ref 5.0–8.0)

## 2022-12-17 LAB — COMPREHENSIVE METABOLIC PANEL
ALT: 16 U/L (ref 0–44)
AST: 23 U/L (ref 15–41)
Albumin: 3.6 g/dL (ref 3.5–5.0)
Alkaline Phosphatase: 81 U/L (ref 38–126)
Anion gap: 10 (ref 5–15)
BUN: 19 mg/dL (ref 8–23)
CO2: 25 mmol/L (ref 22–32)
Calcium: 8.9 mg/dL (ref 8.9–10.3)
Chloride: 99 mmol/L (ref 98–111)
Creatinine, Ser: 0.82 mg/dL (ref 0.44–1.00)
GFR, Estimated: >60 mL/min (ref >60)
Glucose, Bld: 116 mg/dL — ABNORMAL HIGH (ref 70–99)
Potassium: 3.5 mmol/L (ref 3.5–5.1)
Sodium: 134 mmol/L — ABNORMAL LOW (ref 135–145)
Total Bilirubin: 0.3 mg/dL (ref 0.3–1.2)
Total Protein: 7.1 g/dL (ref 6.5–8.1)

## 2022-12-17 LAB — ETHANOL: Alcohol, Ethyl (B): <10 mg/dL (ref <10)

## 2022-12-17 MED ORDER — OXYCODONE-ACETAMINOPHEN 5-325 MG PO TABS
1.0000 | ORAL_TABLET | Freq: Once | ORAL | Status: AC
  Administered 2022-12-17: 1 via ORAL
  Filled 2022-12-17: qty 1

## 2022-12-17 MED ORDER — IOHEXOL 300 MG/ML  SOLN
100.0000 mL | Freq: Once | INTRAMUSCULAR | Status: AC | PRN
  Administered 2022-12-17: 100 mL via INTRAVENOUS

## 2022-12-17 MED ORDER — CEPHALEXIN 500 MG PO CAPS: 500.0000 mg | ORAL_CAPSULE | Freq: Two times a day (BID) | ORAL | 0 refills | Status: DC

## 2022-12-17 NOTE — ED Notes (Signed)
Patient has large bruising on her buttock as well as saddle bruising. Patient's vulva is very swollen. States that when she urinates, it burns and has a foul smell.

## 2022-12-17 NOTE — ED Triage Notes (Signed)
Pt to ED via [facility]- c/o increased pain to middle back s/p fall 2 days ago; sts she had an injection in her back recently and c/o pain since then

## 2022-12-17 NOTE — ED Notes (Signed)
Contact made with sister.  Franchot Mimes [phone]   When discharged call Miguel Dibble (Caregiver and ride) [phone]

## 2022-12-17 NOTE — ED Notes (Signed)
ED Provider at bedside. 

## 2022-12-17 NOTE — ED Provider Notes (Signed)
[name] [name] Provider Note   CSN: [id number] Arrival date & time: 12/17/22  1851     History  Chief Complaint  Patient presents with   [name] [name] is a 72 y.o. female, history of chronic back pain, who presents to the ED secondary to pelvic/anal pain that is been going on for the last month.  She states she falls a lot, and noticed that she is now having more difficulty walking, and weakness.  She states that she has a lot of pain around her anus, and her urethra.  Endorses some dysuria as well.  Denies any fevers or chills.  Denies any history of violence against her, or concerns at home.  She does not have any significant others.  And says that she only has a caretaker that takes care of her.  She is often found on the ground per her caretaker.    Home Medications Prior to Admission medications   Medication Sig Start Date End Date Taking? Authorizing Provider  cephALEXin (KEFLEX) 500 MG capsule Take 1 capsule (500 mg total) by mouth 2 (two) times daily. 12/17/22  Yes Laurenashley Viar L, PA  acetaminophen (TYLENOL) 500 MG tablet Take 500 mg by mouth every 6 (six) hours as needed.    [provider]  aspirin EC 81 MG tablet Take 81 mg daily by mouth.    [provider]  carvedilol (COREG) 6.25 MG tablet TAKE 1 TABLET(6.25 MG) BY MOUTH TWICE DAILY WITH A MEAL 11/30/22   Worthy Rancher B, FNP  Cholecalciferol (VITAMIN D3) 2000 UNITS capsule Take 4,000 Units by mouth daily.  Patient not taking: Reported on 11/05/2021    [provider]  citalopram (CELEXA) 20 MG tablet Take 1 tablet (20 mg total) by mouth daily. 07/02/22   [name], MD  cyanocobalamin 1000 MCG tablet Take 2 tablets by mouth daily. Patient not taking: Reported on 12/16/2021 09/03/21   Love, Evlyn Kanner, PA-C  Evolocumab (REPATHA SURECLICK) 140 MG/ML SOAJ INJECT  UNDER THE SKIN ONCE EVERY 14 DAYS 08/20/22   Hilty, Lisette Abu, MD  folic acid  (FOLVITE) 1 MG tablet Take 1 tablet (1 mg total) by mouth daily. Patient not taking: Reported on 11/05/2021 09/03/21   Love, Evlyn Kanner, PA-C  raloxifene (EVISTA) 60 MG tablet Take 1 tablet (60 mg total) by mouth daily. 04/03/20   Chrystie Nose, MD  rosuvastatin (CRESTOR) 40 MG tablet TAKE 1 TABLET(40 MG) BY MOUTH AT BEDTIME 12/30/21   Hilty, Lisette Abu, MD  sulfamethoxazole-trimethoprim (BACTRIM DS) 800-160 MG tablet Take 1 tablet by mouth 2 (two) times daily. 09/19/22   [name], MD  thiamine 100 MG tablet Take 1 tablet (100 mg total) by mouth daily. Patient not taking: Reported on 11/05/2021 09/04/21   Jacquelynn Cree, PA-C      Allergies    Tramadol hcl, Codeine, and Penicillins    Review of Systems   Review of Systems  Constitutional:  Negative for fever.  Genitourinary:  Positive for dysuria.    Physical Exam Updated Vital Signs BP 139/83   Pulse 95   Temp 98.5 F (36.9 C) (Oral)   Resp 16   Ht  (1.702 m)   Wt 70.8 kg   SpO2 94%   BMI 24.43 kg/m  Physical Exam Vitals and nursing note reviewed. Exam conducted with a chaperone present.  Constitutional:      General: She is not in  acute distress.    Appearance: She is well-developed.  HENT:     Head: Normocephalic and atraumatic.  Eyes:     Conjunctiva/sclera: Conjunctivae normal.  Cardiovascular:     Rate and Rhythm: Normal rate and regular rhythm.     Heart sounds: No murmur heard. Pulmonary:     Effort: Pulmonary effort is normal. No respiratory distress.     Breath sounds: Normal breath sounds.  Abdominal:     Palpations: Abdomen is soft.     Tenderness: There is no abdominal tenderness.  Genitourinary:    Comments: Swollen labia bilaterally, large ecchymosis in perineum area, between anus and vulva.  Significant tenderness to palpation of this area.  No wounds noted. Musculoskeletal:        General: No swelling.     Cervical back: Neck supple.  Skin:    General: Skin is warm and dry.     Capillary Refill:  Capillary refill takes less than 2 seconds.  Neurological:     Mental Status: She is alert.  Psychiatric:        Mood and Affect: Mood normal.     ED Results / Procedures / Treatments   Labs (all labs ordered are listed, but only abnormal results are displayed) Labs Reviewed  CBC WITH DIFFERENTIAL/PLATELET - Abnormal; Notable for the following components:      Result Value   MCV 102.8 (*)    Lymphs Abs 0.4 (*)    All other components within normal limits  COMPREHENSIVE METABOLIC PANEL - Abnormal; Notable for the following components:   Sodium 134 (*)    Glucose, Bld 116 (*)    All other components within normal limits  URINALYSIS, ROUTINE W REFLEX MICROSCOPIC - Abnormal; Notable for the following components:   APPearance CLOUDY (*)    Hgb urine dipstick Marji Kuehnel (*)    Bilirubin Urine Beckham Capistran (*)    Protein, ur 100 (*)    Leukocytes,Ua TRACE (*)    All other components within normal limits  URINALYSIS, MICROSCOPIC (REFLEX) - Abnormal; Notable for the following components:   Bacteria, UA FEW (*)    All other components within normal limits  ETHANOL    EKG None  Radiology CT PELVIS W CONTRAST  Result Date: 12/17/2022 CLINICAL DATA:  Larey Seat 2 days ago, perineal pain EXAM: CT PELVIS WITH CONTRAST TECHNIQUE: Multidetector CT imaging of the pelvis was performed using the standard protocol following the bolus administration of intravenous contrast. RADIATION DOSE REDUCTION: This exam was performed according to the departmental dose-optimization program which includes automated exposure control, adjustment of the mA and/or kV according to patient size and/or use of iterative reconstruction technique. CONTRAST:  OMNIPAQUE IOHEXOL 300 MG/ML  SOLN COMPARISON:  07/30/2020 FINDINGS: Urinary Tract: Distal ureters and bladder are unremarkable. No urinary tract calculi. Bowel: No bowel obstruction or ileus. Normal appendix right lower quadrant. Diverticulosis of the sigmoid colon without  diverticulitis. No bowel wall thickening or inflammatory change. Vascular/Lymphatic: Atherosclerosis of the distal aorta. No pathologic adenopathy. Reproductive:  Uterus and adnexal structures are unremarkable. Other: No free fluid or free intraperitoneal gas. No abdominal wall hernia. Musculoskeletal: No acute or destructive bony lesions. Prior healed left superior and inferior pubic rami fractures. Reconstructed images demonstrate no additional findings. IMPRESSION: 1. No acute intrapelvic process. 2. Sigmoid diverticulosis without diverticulitis. 3.  Aortic Atherosclerosis (ICD10-I70.0). Electronically Signed   By: Sharlet Salina M.D.   On: 12/17/2022 23:03   CT Head Wo Contrast  Result Date: 12/17/2022 CLINICAL DATA:  Head trauma, minor (Age >= 65y) weakness, multiple falls; Neck trauma (Age >= 65y) EXAM: CT HEAD WITHOUT CONTRAST CT CERVICAL SPINE WITHOUT CONTRAST TECHNIQUE: Multidetector CT imaging of the head and cervical spine was performed following the standard protocol without intravenous contrast. Multiplanar CT image reconstructions of the cervical spine were also generated. RADIATION DOSE REDUCTION: This exam was performed according to the departmental dose-optimization program which includes automated exposure control, adjustment of the mA and/or kV according to patient size and/or use of iterative reconstruction technique. COMPARISON:  None Available. FINDINGS: CT HEAD FINDINGS Brain: Normal anatomic configuration. Moderate global parenchymal volume loss. Mild periventricular white matter changes are present likely reflecting the sequela of Raea Magallon vessel ischemia. No abnormal intra or extra-axial mass lesion or fluid collection. No abnormal mass effect or midline shift. No evidence of acute intracranial hemorrhage or infarct. Ventricular size is normal. Cerebellum unremarkable. Vascular: No asymmetric hyperdense vasculature at the skull base. Skull: Intact Sinuses/Orbits: Paranasal sinuses are  clear. Orbits are unremarkable. Other: Mastoid air cells and middle ear cavities are clear. CT CERVICAL SPINE FINDINGS Alignment: Normal. Skull base and vertebrae: No acute fracture. No primary bone lesion or focal pathologic process. Soft tissues and spinal canal: No prevertebral fluid or swelling. No visible canal hematoma. Broad-based disc bulges at C4-5, C5-6 and C6-7 abut the thecal sac without significant remodeling. Disc levels: There is disc space narrowing and endplate remodeling noted at C4-C7 in keeping with changes of mild degenerative disc disease. Prevertebral soft tissues are not thickened on sagittal reformats. No high-grade canal stenosis. No high-grade neuroforaminal narrowing. Upper chest: Negative. Other: None IMPRESSION: 1. No acute intracranial abnormality. No calvarial fracture. 2. No acute fracture or listhesis of the cervical spine. Electronically Signed   By: Helyn Numbers M.D.   On: 12/17/2022 23:01   CT Cervical Spine Wo Contrast  Result Date: 12/17/2022 CLINICAL DATA:  Head trauma, minor (Age >= 65y) weakness, multiple falls; Neck trauma (Age >= 65y) EXAM: CT HEAD WITHOUT CONTRAST CT CERVICAL SPINE WITHOUT CONTRAST TECHNIQUE: Multidetector CT imaging of the head and cervical spine was performed following the standard protocol without intravenous contrast. Multiplanar CT image reconstructions of the cervical spine were also generated. RADIATION DOSE REDUCTION: This exam was performed according to the departmental dose-optimization program which includes automated exposure control, adjustment of the mA and/or kV according to patient size and/or use of iterative reconstruction technique. COMPARISON:  None Available. FINDINGS: CT HEAD FINDINGS Brain: Normal anatomic configuration. Moderate global parenchymal volume loss. Mild periventricular white matter changes are present likely reflecting the sequela of Davon Folta vessel ischemia. No abnormal intra or extra-axial mass lesion or fluid  collection. No abnormal mass effect or midline shift. No evidence of acute intracranial hemorrhage or infarct. Ventricular size is normal. Cerebellum unremarkable. Vascular: No asymmetric hyperdense vasculature at the skull base. Skull: Intact Sinuses/Orbits: Paranasal sinuses are clear. Orbits are unremarkable. Other: Mastoid air cells and middle ear cavities are clear. CT CERVICAL SPINE FINDINGS Alignment: Normal. Skull base and vertebrae: No acute fracture. No primary bone lesion or focal pathologic process. Soft tissues and spinal canal: No prevertebral fluid or swelling. No visible canal hematoma. Broad-based disc bulges at C4-5, C5-6 and C6-7 abut the thecal sac without significant remodeling. Disc levels: There is disc space narrowing and endplate remodeling noted at C4-C7 in keeping with changes of mild degenerative disc disease. Prevertebral soft tissues are not thickened on sagittal reformats. No high-grade canal stenosis. No high-grade neuroforaminal narrowing. Upper chest: Negative. Other: None IMPRESSION: 1. No  acute intracranial abnormality. No calvarial fracture. 2. No acute fracture or listhesis of the cervical spine. Electronically Signed   By: Helyn Numbers M.D.   On: 12/17/2022 23:01   CT Lumbar Spine Wo Contrast  Result Date: 12/17/2022 CLINICAL DATA:  Larey Seat 2 days ago, mid back pain, history of lung cancer EXAM: CT LUMBAR SPINE WITHOUT CONTRAST TECHNIQUE: Multidetector CT imaging of the lumbar spine was performed without intravenous contrast administration. Multiplanar CT image reconstructions were also generated. RADIATION DOSE REDUCTION: This exam was performed according to the departmental dose-optimization program which includes automated exposure control, adjustment of the mA and/or kV according to patient size and/or use of iterative reconstruction technique. COMPARISON:  09/07/2022 FINDINGS: Segmentation: 5 lumbar type vertebrae. Alignment: Alignment is anatomic. Vertebrae: Chronic L2  compression deformity, with prior vertebral augmentation. There are no acute lumbar spine fractures. Superior aspect of the L1 vertebral body is not included due to slice selection. There are no destructive bony lesions. Paraspinal and other soft tissues: Paraspinal soft tissues are unremarkable. Atherosclerosis of the aorta. Disc levels: From L2-3 through L4-5, circumferential disc bulge and bilateral facet hypertrophy is identified resulting in mild central canal stenosis. Changes are most pronounced at the L2-3 and L3-4 levels. IMPRESSION: 1. Chronic L2 compression deformity and prior vertebral augmentation. 2. No acute lumbar spine fracture. 3. Multilevel lumbar spondylosis greatest at L2-3 and L3-4. No change since prior exam. Electronically Signed   By: Sharlet Salina M.D.   On: 12/17/2022 22:59    Procedures Procedures    Medications Ordered in ED Medications  oxyCODONE-acetaminophen (PERCOCET/ROXICET) 5-325 MG per tablet 1 tablet (1 tablet Oral Given 12/17/22 2037)  iohexol (OMNIPAQUE) 300 MG/ML solution 100 mL (100 mLs Intravenous Contrast Given 12/17/22 2217)    ED Course/ Medical Decision Making/ A&P                             Medical Decision Making Patient is 72 year old female, here for rectal/pelvic pain has been going on for the last month after having frequent falls.  She has had multiple falls, and bruising in her perineum.  She denies any concerns for any kind of assaults.  We will obtain a CT of her pelvis, as well as head/neck/back, as she has had frequent falls.  Initially was unable to walk, but then is seen walking freely by staff.  Amount and/or Complexity of Data Reviewed Labs: ordered.    Details: Labs are unremarkable Radiology: ordered.    Details: CT scans are unremarkable except for old fractures visualized. Discussion of management or test interpretation with external provider(s): Patient walking around in the room, is well-appearing, able to ambulate, CT scans  are unremarkable, she does have bruising to her perineum which may be resulting in some of the pain, as well as her vulva appears swollen count of tender.  We will start her on Keflex for possible infection, have her follow-up with her primary care doctor.  We discussed bruising, and treatment for this, and management of the pain.  She is well-appearing and able to discharge, discharged home with strict return precautions.  Risk Prescription drug management.    Final Clinical Impression(s) / ED Diagnoses Final diagnoses:  Fall, initial encounter  Perineum pain, female  Petechiae or ecchymoses    Rx / DC Orders ED Discharge Orders          Ordered    cephALEXin (KEFLEX) 500 MG capsule  2 times daily  12/17/22 2359              Takya Vandivier, Harley Alto, PA 12/18/22 0002    Tegeler, Canary Brim, MD 12/19/22 [id number]

## 2022-12-17 NOTE — Discharge Instructions (Addendum)
Please follow-up with your primary care doctor, I have prescribed Keflex, for the swelling of your vulva, secondary to probable infection from your urinary incontinence.  Please take it return to the ED if you have any like worsening symptoms, fall, head trauma, confusion, intractable nausea or vomiting.

## 2022-12-18 ENCOUNTER — Telehealth (HOSPITAL_BASED_OUTPATIENT_CLINIC_OR_DEPARTMENT_OTHER): Payer: Self-pay

## 2022-12-18 NOTE — ED Notes (Signed)
Caregiver called to pick pt up from ED

## 2022-12-18 NOTE — ED Notes (Signed)
Pt ambulated down hallway well.

## 2022-12-18 NOTE — Telephone Encounter (Signed)
Patient caregiver, Selena Batten, called and spoke with this nurse stating patient is unable to swallow pills and requested prescription for keflex be changed to liquid form. Called and spoke with [facility] pharmacist who is changing prescription to liquid. Informed Kim of changes made.

## 2023-01-07 ENCOUNTER — Ambulatory Visit (INDEPENDENT_AMBULATORY_CARE_PROVIDER_SITE_OTHER): Payer: Medicare Other | Admitting: Internal Medicine

## 2023-01-07 ENCOUNTER — Encounter (HOSPITAL_BASED_OUTPATIENT_CLINIC_OR_DEPARTMENT_OTHER): Payer: Self-pay | Admitting: Internal Medicine

## 2023-01-07 VITALS — BP 135/88 | HR 107 | Ht 67.0 in | Wt 149.5 lb

## 2023-01-07 DIAGNOSIS — E7849 Other hyperlipidemia: Secondary | ICD-10-CM | POA: Diagnosis not present

## 2023-01-07 MED ORDER — REPATHA SURECLICK 140 MG/ML ~~LOC~~ SOAJ: SUBCUTANEOUS | 11 refills | Status: DC

## 2023-01-07 MED ORDER — REPATHA SURECLICK 140 MG/ML ~~LOC~~ SOAJ: 140.0000 mg | SUBCUTANEOUS | 0 refills | Status: DC

## 2023-01-07 NOTE — Patient Instructions (Addendum)
Medication Instructions:  Your physician recommends that you continue on your current medications as directed. Please refer to the Current Medication list given to you today.   *If you need a refill on your cardiac medications before your next appointment, please call your pharmacy*  Lab Work: Please return for FASTING labs (Lipid)  [facility] locations:   [facility] - [address] - [address] ([facility]) - [address] [phone] [address]   [place] - [address]    [place]  - [address]    [place] - [address] - [address] [employer]  - [address] - [address] ([facility])  Follow-Up: At [facility], you and your health needs are our priority.  As part of our continuing mission to provide you with exceptional heart care, we have created designated Provider Care Teams.  These Care Teams include your primary Cardiologist (physician) and Advanced Practice Providers (APPs -  Physician Assistants and Nurse Practitioners) who all work together to provide you with the care you need, when you need it.  We recommend signing up for the patient portal called "MyChart".  Sign up information is provided on this After Visit Summary.  MyChart is used to connect with patients for Virtual Visits (Telemedicine).  Patients are able to view lab/test results, encounter notes, upcoming appointments, etc.  Non-urgent messages can be sent to your provider as well.   To learn more about what you can do with MyChart, go to [email or web address].    Your next appointment:   12 month(s) with Dr. [facility]

## 2023-01-07 NOTE — Progress Notes (Signed)
Chief Complaint:  Follow-up dyslipidemia  Primary Care Physician: [name], MD  HPI:  [name] [name] is a 72 y.o. female who is being seen today for the evaluation of dyslipidemia and statin intolerance at the request of [name], MD.  [name] [name] is a pleasant 72 year old female with an unfortunate history of lung cancer in the past status post radiation therapy.  She also has hypertension and marked dyslipidemia.  She is currently treated on rosuvastatin 40 mg and ezetimibe 10 mg daily.  Her last lipid profile from January 2019 indicated total cholesterol 334, triglycerides 156, HDL 73 and LDL at 229.  While she does have a high HDL, I suspect that it is likely nonfunctional.  This is actually an atherogenic lipid profile.  In fact, a chest CT scan for follow-up of her lung malignancy and June 2018 demonstrated "dense coronary artery vascular calcifications".  This could be also related to radiation injury to the coronaries.  She does have a history of stroke in her father and A. fib in her mother.  She is not aware of her sister has any cardiovascular disease and she does have 2 children.  Neither of them have been screened for elevated cholesterol.  She also reports eating a very atherogenic diet.  She eats 1-2 eggs several days a week, shrimp, and chicken livers, amongst other foods.  She reports recently having problems with the diarrhea and saw Dr. Marina Goodell, who prescribed her for Colestid.  This was for the tablets, but she reports also a history of difficulty swallowing tablets.  Based on the size of these tablets, she is not taking the medication.  03/25/2018  [name] [name] returns today for follow-up.  She is done very well at lowering her cholesterol.  She is made significant dietary changes and has been taking Colestid once daily (not twice daily) in addition to rosuvastatin 40 mg.  She is also on Zetia 10 mg daily.  Her LDL cholesterol has gone down from 229-133.  She  still not at goal given her extensive atherosclerosis of LDL less than 70.  Her last lipid profile was in April and will need to repeat that today.  07/01/2018  [name] [name] returns today for follow-up of dyslipidemia.  She is tolerating rosuvastatin 40 mg daily as well as ezetimibe 10 mg daily.  Unfortunately she has multivessel coronary artery calcification.  Her goal LDL is less than 70.  Most recently she had a lipid profile 3 months ago which showed total cholesterol 242, triglycerides 143, HDL 84 and LDL 129.  As mentioned she is a good candidate for PCSK9 inhibitor however when we applied for Repatha her insurance denied it.  It was recommended that Praluent may be the preferred agent therefore will go ahead and pursue that.  She would not be a candidate for the upcoming Vesalius trial.  04/14/2019  [name] [name] is seen today in follow-up in the lipid clinic.  Via telemedicine visit on December 06, 2018.  At that time she had prior authorization for Praluent but had not started taking it.  She was denied for patient assistance in the past program.  Ahead and get the prescription filled and start taking it.  She did do that and subsequently action in her cholesterol.  Total cholesterol has decreased from 273 down to 116.  Triglycerides are 98, HDL 82 and LDL cholesterol is improved from 159 down to 14.  Overall she loves the medicine and says that she is  tolerating it very well.  10/24/2019  [name] [name] returns today for follow-up.  I went ahead and discontinued her ezetimibe due to very low cholesterol.  She has had an expected increased from 116-150 total cholesterol, triglycerides 114, HDL 80 and LDL 50.  This still indicates excellent control.  She "loves" the Praluent.  She really enjoys giving herself shots.  In general her control has been excellent.  11/23/2020  [name] [name] is seen today in follow-up.  She continues to do well on Repatha.  I believe she was switched over from  Praluent due to insurance issues.  Her cholesterol is excellent with total 159, triglycerides 145, HDL 86 and LDL 49.  Blood pressure is also well controlled today.  Unfortunately she had a recent fall that led to pelvic fracture from which she is recovering.  01/07/2023  [name] [name] is seen today in follow-up.  Unfortunately she has had 2 more falls this year and ended up in the emergency department.  She says she took her last Repatha shot a little more than 2 weeks ago and is due for one but she is out of the medication.  We were able to provide her with a sample today.  She is due for reauthorization of the medication.  She needs repeat lipids, however.  She is not fasting today.  PMHx:  Past Medical History:  Diagnosis Date   Anemia    duering cancer treatment    Anxiety    Arthritis    OA hands    Bilateral lower extremity pain    Blood transfusion without reported diagnosis    during chemo for lung cancer   Cataract    forming  but very small    Cholelithiasis    Colon polyps    adenomatous   Concussion 08/25/2021   ETOHdependence (HCC)    High cholesterol    History of chemotherapy    for lung cancer- completed 2013   Hx of radiation therapy 10/07/11 to 11/20/11   right lung   Hx of radiation therapy 01/07/2012- 01/21/12   cranial irradiation   Hypertension    Lung cancer (HCC) 09/19/2011   Stage III currently in remission   Malignant melanoma of skin of canthus of right eye (HCC) 02/2017   Osteoporosis 04/2014   Dexa scan by Dr. Rana Snare, T-score -2.7, next in 2 years, with at least 13 % major fracture in 10 years   Pitting edema    Bilateral lower extremities, duplex ultrasound 04/03/2015 normal, no DVT    Past Surgical History:  Procedure Laterality Date   CHOLECYSTECTOMY  04/2011   biliary stent placement   CLOSED REDUCTION FINGER WITH PERCUTANEOUS PINNING Left 10/23/2021   Procedure: LEFT INDEX, MIDDLE, RING, AND SMALL FINGER CLOSED REDUCTION FINGER WITH PERCUTANEOUS  PINNING;  Surgeon: Marlyne Beards, MD;  Location: [facility];  Service: Orthopedics;  Laterality: Left;   COLONOSCOPY     INCISIONAL HERNIA REPAIR  2015   IR KYPHO LUMBAR INC FX REDUCE BONE BX UNI/BIL CANNULATION INC/IMAGING  09/18/2022   MOHS SURGERY Right 02/2017   cheek and eyelid   Drue Stager Touch     Vaginal procedure   POLYPECTOMY     TUBAL LIGATION      FAMHx:  Family History  Problem Relation Age of Onset   Bladder Cancer Father    Stroke Father    Colon polyps Father    Atrial fibrillation Mother    Heart disease Mother  CHF   Clotting disorder Mother        warfarin   Colon polyps Mother    Colon cancer Maternal Aunt 3   Drug abuse Son    Breast cancer Neg Hx    Esophageal cancer Neg Hx    Stomach cancer Neg Hx    Rectal cancer Neg Hx     SOCHx:   reports that she quit smoking about 11 years ago. Her smoking use included cigarettes. She has never used smokeless tobacco. She reports current alcohol use. She reports that she does not use drugs.  ALLERGIES:  Allergies  Allergen Reactions   Tramadol Hcl Nausea And Vomiting   Codeine Nausea And Vomiting   Penicillins Rash    ROS: Pertinent items noted in HPI and remainder of comprehensive ROS otherwise negative.  HOME MEDS: Current Outpatient Medications on File Prior to Visit  Medication Sig Dispense Refill   acetaminophen (TYLENOL) 500 MG tablet Take 500 mg by mouth every 6 (six) hours as needed.     aspirin EC 81 MG tablet Take 81 mg daily by mouth.     carvedilol (COREG) 6.25 MG tablet TAKE 1 TABLET(6.25 MG) BY MOUTH TWICE DAILY WITH A MEAL 180 tablet 1   cephALEXin (KEFLEX) 500 MG capsule Take 1 capsule (500 mg total) by mouth 2 (two) times daily. 10 capsule 0   Cholecalciferol (VITAMIN D3) 2000 UNITS capsule Take 4,000 Units by mouth daily.  (Patient not taking: Reported on 11/05/2021)     citalopram (CELEXA) 20 MG tablet Take 1 tablet (20 mg total) by mouth daily. 90 tablet 1    cyanocobalamin 1000 MCG tablet Take 2 tablets by mouth daily. (Patient not taking: Reported on 12/16/2021) 60 tablet 0   Evolocumab (REPATHA SURECLICK) 140 MG/ML SOAJ INJECT 140MG  UNDER THE SKIN ONCE EVERY 14 DAYS 2 mL 11   folic acid (FOLVITE) 1 MG tablet Take 1 tablet (1 mg total) by mouth daily. (Patient not taking: Reported on 11/05/2021) 30 tablet 0   raloxifene (EVISTA) 60 MG tablet Take 1 tablet (60 mg total) by mouth daily. 90 tablet 3   rosuvastatin (CRESTOR) 40 MG tablet TAKE 1 TABLET(40 MG) BY MOUTH AT BEDTIME 90 tablet 1   sulfamethoxazole-trimethoprim (BACTRIM DS) 800-160 MG tablet Take 1 tablet by mouth 2 (two) times daily. 6 tablet 0   thiamine 100 MG tablet Take 1 tablet (100 mg total) by mouth daily. (Patient not taking: Reported on 11/05/2021) 30 tablet 0   No current facility-administered medications on file prior to visit.    LABS/IMAGING: No results found for this or any previous visit (from the past 48 hour(s)). No results found.  LIPID PANEL:    Component Value Date/Time   CHOL 193 08/26/2021 0221   CHOL 159 11/20/2020 1001   TRIG 138 08/26/2021 0221   HDL 67 08/26/2021 0221   HDL 86 11/20/2020 1001   CHOLHDL 2.9 08/26/2021 0221   VLDL 28 08/26/2021 0221   LDLCALC 98 08/26/2021 0221   LDLCALC 49 11/20/2020 1001   LDLDIRECT 282.7 12/03/2012 1138    WEIGHTS: Wt Readings from Last 3 Encounters:  12/17/22 156 lb (70.8 kg)  09/29/22 156 lb (70.8 kg)  09/18/22 156 lb (70.8 kg)    VITALS: There were no vitals taken for this visit.  EXAM: Deferred  EKG: N/A  ASSESSMENT: Extensive coronary artery calcification Dyslipidemia not at goal LDL less than 70 Probable FH - Dutch score of 6 Atherogenic diet  PLAN: 1.  [name] [name] has had improvement in her dyslipidemia on combination therapy with Repatha and rosuvastatin.  She reports compliance with the medicine but recently ran out of it.  We provided a sample today.  We will get repeat lab work fasting  and plan to get reauthorization to remain on Repatha.  I will contact her with those results with plan follow-up with me annually or sooner as necessary.  Chrystie Nose, MD, [facility], FACP    [facility]  Medical Director of the Advanced Lipid Disorders &  Cardiovascular Risk Reduction Clinic Diplomate of the American Board of Clinical Lipidology Attending Cardiologist  Direct Dial: [phone]  Fax: [phone]  Website:  [email or web address] Nicely Thane Age 01/07/2023, 2:00 PM

## 2023-01-20 DIAGNOSIS — Z1283 Encounter for screening for malignant neoplasm of skin: Secondary | ICD-10-CM | POA: Diagnosis not present

## 2023-01-20 DIAGNOSIS — Z85820 Personal history of malignant melanoma of skin: Secondary | ICD-10-CM | POA: Diagnosis not present

## 2023-01-20 DIAGNOSIS — D225 Melanocytic nevi of trunk: Secondary | ICD-10-CM | POA: Diagnosis not present

## 2023-01-20 DIAGNOSIS — Z08 Encounter for follow-up examination after completed treatment for malignant neoplasm: Secondary | ICD-10-CM | POA: Diagnosis not present

## 2023-01-22 ENCOUNTER — Telehealth: Payer: Self-pay | Admitting: Medical Oncology

## 2023-01-22 NOTE — Telephone Encounter (Signed)
Decline in health- Sister said Ekko has " declined in the last year-cognitively -( no examples given), she fell and sustained non-displaced fractured ribs, had kyphoplasty . She is not interested in her personal care anymore and is very unlike her. It is a day to day struggle with her ".  She also reports Camara is " self medicating  with alcohol . Darl Pikes will not let her drive. Appts  confirmed for June.

## 2023-01-27 ENCOUNTER — Telehealth: Payer: Self-pay | Admitting: Internal Medicine

## 2023-01-27 ENCOUNTER — Other Ambulatory Visit: Payer: Self-pay

## 2023-01-27 ENCOUNTER — Ambulatory Visit (HOSPITAL_COMMUNITY)
Admission: RE | Admit: 2023-01-27 | Discharge: 2023-01-27 | Disposition: A | Discharge status: Home / Self Care | Payer: Medicare Other | Source: Ambulatory Visit | Attending: Physician Assistant | Admitting: Physician Assistant

## 2023-01-27 ENCOUNTER — Inpatient Hospital Stay: Payer: Medicare Other | Attending: Internal Medicine

## 2023-01-27 DIAGNOSIS — R42 Dizziness and giddiness: Secondary | ICD-10-CM | POA: Insufficient documentation

## 2023-01-27 DIAGNOSIS — Z85118 Personal history of other malignant neoplasm of bronchus and lung: Secondary | ICD-10-CM | POA: Diagnosis not present

## 2023-01-27 DIAGNOSIS — I10 Essential (primary) hypertension: Secondary | ICD-10-CM | POA: Diagnosis not present

## 2023-01-27 DIAGNOSIS — Z7982 Long term (current) use of aspirin: Secondary | ICD-10-CM | POA: Diagnosis not present

## 2023-01-27 DIAGNOSIS — E7800 Pure hypercholesterolemia, unspecified: Secondary | ICD-10-CM | POA: Diagnosis not present

## 2023-01-27 DIAGNOSIS — C3490 Malignant neoplasm of unspecified part of unspecified bronchus or lung: Secondary | ICD-10-CM | POA: Diagnosis not present

## 2023-01-27 DIAGNOSIS — Z85820 Personal history of malignant melanoma of skin: Secondary | ICD-10-CM | POA: Diagnosis not present

## 2023-01-27 DIAGNOSIS — Z9221 Personal history of antineoplastic chemotherapy: Secondary | ICD-10-CM | POA: Diagnosis not present

## 2023-01-27 DIAGNOSIS — Z923 Personal history of irradiation: Secondary | ICD-10-CM | POA: Diagnosis not present

## 2023-01-27 DIAGNOSIS — Z79899 Other long term (current) drug therapy: Secondary | ICD-10-CM | POA: Insufficient documentation

## 2023-01-27 DIAGNOSIS — C801 Malignant (primary) neoplasm, unspecified: Secondary | ICD-10-CM | POA: Insufficient documentation

## 2023-01-27 LAB — CBC WITH DIFFERENTIAL (CANCER CENTER ONLY)
Abs Immature Granulocytes: 0.02 10*3/uL (ref 0.00–0.07)
Basophils Absolute: 0 10*3/uL (ref 0.0–0.1)
Basophils Relative: 1 %
Eosinophils Absolute: 0.1 10*3/uL (ref 0.0–0.5)
Eosinophils Relative: 1 %
HCT: 40 % (ref 36.0–46.0)
Hemoglobin: 13.3 g/dL (ref 12.0–15.0)
Immature Granulocytes: 0 %
Lymphocytes Relative: 10 %
Lymphs Abs: 0.8 10*3/uL (ref 0.7–4.0)
MCH: 32.9 pg (ref 26.0–34.0)
MCHC: 33.3 g/dL (ref 30.0–36.0)
MCV: 99 fL (ref 80.0–100.0)
Monocytes Absolute: 0.7 10*3/uL (ref 0.1–1.0)
Monocytes Relative: 9 %
Neutro Abs: 6.4 10*3/uL (ref 1.7–7.7)
Neutrophils Relative %: 79 %
Platelet Count: 224 10*3/uL (ref 150–400)
RBC: 4.04 MIL/uL (ref 3.87–5.11)
RDW: 11.6 % (ref 11.5–15.5)
WBC Count: 8 10*3/uL (ref 4.0–10.5)
nRBC: 0 % (ref 0.0–0.2)

## 2023-01-27 LAB — CMP (CANCER CENTER ONLY)
ALT: 10 U/L (ref 0–44)
AST: 17 U/L (ref 15–41)
Albumin: 4.1 g/dL (ref 3.5–5.0)
Alkaline Phosphatase: 65 U/L (ref 38–126)
Anion gap: 9 (ref 5–15)
BUN: 13 mg/dL (ref 8–23)
CO2: 27 mmol/L (ref 22–32)
Calcium: 9.5 mg/dL (ref 8.9–10.3)
Chloride: 102 mmol/L (ref 98–111)
Creatinine: 1.07 mg/dL — ABNORMAL HIGH (ref 0.44–1.00)
GFR, Estimated: 56 mL/min — ABNORMAL LOW (ref >60)
Glucose, Bld: 104 mg/dL — ABNORMAL HIGH (ref 70–99)
Potassium: 3.7 mmol/L (ref 3.5–5.1)
Sodium: 138 mmol/L (ref 135–145)
Total Bilirubin: 0.3 mg/dL (ref 0.3–1.2)
Total Protein: 6.9 g/dL (ref 6.5–8.1)

## 2023-01-27 NOTE — Telephone Encounter (Signed)
Copied from CRM [id number]. Topic: Medicare AWV >> Jan 27, 2023 10:29 AM Payton Doughty wrote: Reason for CRM: LM 01/27/2023 to schedule AWV   Verlee Rossetti; Care Guide Ambulatory Clinical Support [place] [facility] Direct Dial: [phone]

## 2023-01-28 ENCOUNTER — Inpatient Hospital Stay (HOSPITAL_BASED_OUTPATIENT_CLINIC_OR_DEPARTMENT_OTHER): Payer: Medicare Other | Admitting: Internal Medicine

## 2023-01-28 DIAGNOSIS — C3490 Malignant neoplasm of unspecified part of unspecified bronchus or lung: Secondary | ICD-10-CM | POA: Diagnosis not present

## 2023-01-28 NOTE — Progress Notes (Signed)
[facility] Telephone:([phone]   [phone]  PROGRESS NOTE FOR TELEMEDICINE VISITS  [name], MD [address] [place] [address]  I connected withNAME@ on 01/28/23 at  3:15 PM EDT by telephone visit and verified that I am speaking with the correct person using two identifiers.   I discussed the limitations, risks, security and privacy concerns of performing an evaluation and management service by telemedicine and the availability of in-person appointments. I also discussed with the patient that there may be a patient responsible charge related to this service. The patient expressed understanding and agreed to proceed.  Other persons participating in the visit and their role in the encounter: Her sister  Patient's location: Home Provider's location: [facility]  DIAGNOSIS: Limited stage small cell lung cancer diagnosed in January of 2013    PRIOR THERAPY: 1) Systemic chemotherapy with cisplatin at 60 mg per meter square given on day 1 and etoposide at 120 mg per meter square given on days one 2 and 3 with Neulasta support given on day 4 status post 4 cycles, last cycle was given on 12/02/2011 . This with concurrent with radiotherapy under the care of Dr. Mitzi Hansen.  2) prophylactic cranial irradiation under the care of Dr. Mitzi Hansen completed on 01/21/2012    CURRENT THERAPY: Observation  INTERVAL HISTORY: [name] [name] 72 y.o. female has a telephone virtual visit with me today for evaluation and discussion of her scan results.  The patient is feeling fine today with no concerning complaints except for the frequent dizzy spells.  She had extensive evaluation last year by neurology including MRI of the brain that were negative.  She denied having any current chest pain, shortness of breath, cough or hemoptysis.  She has no nausea, vomiting, diarrhea or constipation.  She has no fever or chills.  She had repeat CT scan of the  chest performed recently and we are having the visit for evaluation and discussion of her scan results.  MEDICAL HISTORY: Past Medical History:  Diagnosis Date   Anemia    duering cancer treatment    Anxiety    Arthritis    OA hands    Bilateral lower extremity pain    Blood transfusion without reported diagnosis    during chemo for lung cancer   Cataract    forming  but very small    Cholelithiasis    Colon polyps    adenomatous   Concussion 08/25/2021   ETOHdependence (HCC)    High cholesterol    History of chemotherapy    for lung cancer- completed 2013   Hx of radiation therapy 10/07/11 to 11/20/11   right lung   Hx of radiation therapy 01/07/2012- 01/21/12   cranial irradiation   Hypertension    Lung cancer (HCC) 09/19/2011   Stage III currently in remission   Malignant melanoma of skin of canthus of right eye (HCC) 02/2017   Osteoporosis 04/2014   Dexa scan by Dr. Rana Snare, T-score -2.7, next in 2 years, with at least 13 % major fracture in 10 years   Pitting edema    Bilateral lower extremities, duplex ultrasound 04/03/2015 normal, no DVT    ALLERGIES:  is allergic to tramadol hcl, codeine, and penicillins.  MEDICATIONS:  Current Outpatient Medications  Medication Sig Dispense Refill   acetaminophen (TYLENOL) 500 MG tablet Take 500 mg by mouth every 6 (six) hours as needed.     aspirin EC 81 MG  tablet Take 81 mg daily by mouth.     carvedilol (COREG) 6.25 MG tablet TAKE 1 TABLET(6.25 MG) BY MOUTH TWICE DAILY WITH A MEAL 180 tablet 1   Cholecalciferol (VITAMIN D3) 2000 UNITS capsule Take 4,000 Units by mouth daily.  (Patient not taking: Reported on 11/05/2021)     citalopram (CELEXA) 20 MG tablet Take 1 tablet (20 mg total) by mouth daily. 90 tablet 1   cyanocobalamin 1000 MCG tablet Take 2 tablets by mouth daily. (Patient not taking: Reported on 12/16/2021) 60 tablet 0   Evolocumab (REPATHA SURECLICK) 140 MG/ML SOAJ INJECT 140MG  UNDER THE SKIN ONCE EVERY 14 DAYS 2 mL 11    Evolocumab (REPATHA SURECLICK) 140 MG/ML SOAJ Inject 140 mg into the skin every 14 (fourteen) days. 1 mL 0   folic acid (FOLVITE) 1 MG tablet Take 1 tablet (1 mg total) by mouth daily. (Patient not taking: Reported on 11/05/2021) 30 tablet 0   raloxifene (EVISTA) 60 MG tablet Take 1 tablet (60 mg total) by mouth daily. 90 tablet 3   rosuvastatin (CRESTOR) 40 MG tablet TAKE 1 TABLET(40 MG) BY MOUTH AT BEDTIME 90 tablet 1   thiamine 100 MG tablet Take 1 tablet (100 mg total) by mouth daily. (Patient not taking: Reported on 11/05/2021) 30 tablet 0   No current facility-administered medications for this visit.    SURGICAL HISTORY:  Past Surgical History:  Procedure Laterality Date   CHOLECYSTECTOMY  04/2011   biliary stent placement   CLOSED REDUCTION FINGER WITH PERCUTANEOUS PINNING Left 10/23/2021   Procedure: LEFT INDEX, MIDDLE, RING, AND SMALL FINGER CLOSED REDUCTION FINGER WITH PERCUTANEOUS PINNING;  Surgeon: Marlyne Beards, MD;  Location: [facility];  Service: Orthopedics;  Laterality: Left;   COLONOSCOPY     INCISIONAL HERNIA REPAIR  2015   IR KYPHO LUMBAR INC FX REDUCE BONE BX UNI/BIL CANNULATION INC/IMAGING  09/18/2022   MOHS SURGERY Right 02/2017   cheek and eyelid   Drue Stager Touch     Vaginal procedure   POLYPECTOMY     TUBAL LIGATION      REVIEW OF SYSTEMS:  A comprehensive review of systems was negative except for: Constitutional: positive for fatigue Neurological: positive for dizziness    LABORATORY DATA: Lab Results  Component Value Date   WBC 8.0 01/27/2023   HGB 13.3 01/27/2023   HCT 40.0 01/27/2023   MCV 99.0 01/27/2023   PLT 224 01/27/2023      Chemistry      Component Value Date/Time   NA 138 01/27/2023 1435   NA 143 02/05/2017 0817   K 3.7 01/27/2023 1435   K 4.3 02/05/2017 0817   CL 102 01/27/2023 1435   CL 104 12/23/2012 1338   CO2 27 01/27/2023 1435   CO2 25 02/05/2017 0817   BUN 13 01/27/2023 1435   BUN 16.0 02/05/2017 0817    CREATININE 1.07 (H) 01/27/2023 1435   CREATININE 1.0 02/05/2017 0817      Component Value Date/Time   CALCIUM 9.5 01/27/2023 1435   CALCIUM 9.5 02/05/2017 0817   ALKPHOS 65 01/27/2023 1435   ALKPHOS 48 02/05/2017 0817   AST 17 01/27/2023 1435   AST 21 02/05/2017 0817   ALT 10 01/27/2023 1435   ALT 12 02/05/2017 0817   BILITOT 0.3 01/27/2023 1435   BILITOT 0.31 02/05/2017 0817       RADIOGRAPHIC STUDIES: No results found.  ASSESSMENT AND PLAN: This is a very pleasant 72 years old white female with limited  stage small cell lung cancer status post systemic chemotherapy with cisplatin and etoposide concurrent with radiation and followed by prophylactic cranial irradiation completed in May 2013. She has been on observation since that time and the patient is feeling fine today with no concerning complaints. The patient has been doing fine with no concerning complaints except for the frequent dizzy spells and she had extensive investigation in the past that were unremarkable. She had repeat CT scan of the chest performed yesterday.  The final report is still pending but I personally and independently reviewed the scan images and I did not see any clear evidence for progression but will wait for the final report for confirmation. I recommended for her to continue on observation with repeat CT scan of the chest in 1 year. For the dizzy spells, the patient was advised to reach out to her primary care physician and also consider referral to neurology for further evaluation of her condition if no improvement.  I offered her MRI of the brain but she mentioned that she had extensive evaluation in the past that were unremarkable. She was advised to call immediately if she has any other concerning symptoms in the interval. I discussed the assessment and treatment plan with the patient. The patient was provided an opportunity to ask questions and all were answered. The patient agreed with the plan and  demonstrated an understanding of the instructions.   The patient was advised to call back or seek an in-person evaluation if the symptoms worsen or if the condition fails to improve as anticipated.  I provided 20 minutes of non face-to-face telephone visit time during this encounter, and > 50% was spent counseling as documented under my assessment & plan.  Lajuana Matte, MD 01/28/2023 3:27 PM  Disclaimer: This note was dictated with voice recognition software. Similar sounding words can inadvertently be transcribed and may not be corrected upon review.

## 2023-01-29 ENCOUNTER — Telehealth: Payer: Medicare Other | Admitting: Internal Medicine

## 2023-02-01 ENCOUNTER — Other Ambulatory Visit: Payer: Self-pay | Admitting: Internal Medicine

## 2023-02-01 DIAGNOSIS — E785 Hyperlipidemia, unspecified: Secondary | ICD-10-CM

## 2023-02-09 ENCOUNTER — Encounter: Payer: Medicare Other | Admitting: *Deleted

## 2023-02-09 NOTE — Progress Notes (Signed)
Called pt twice for AWV.  Pt did not answer.This encounter was created in error - please disregard.

## 2023-03-03 ENCOUNTER — Other Ambulatory Visit: Payer: Self-pay | Admitting: Internal Medicine

## 2023-04-13 ENCOUNTER — Other Ambulatory Visit: Payer: Self-pay | Admitting: Internal Medicine

## 2023-04-14 MED ORDER — CITALOPRAM HYDROBROMIDE 20 MG PO TABS: 20.0000 mg | ORAL_TABLET | Freq: Every day | ORAL | 1 refills | Status: DC

## 2023-04-14 NOTE — Addendum Note (Signed)
Addended byConrad Lake Darby D on: 04/14/2023 03:53 PM   Modules accepted: Orders

## 2023-04-14 NOTE — Telephone Encounter (Signed)
[name] [name] (sister [facility]) called stating that she is having a hard time getting her in for an appt and was wondering if she could have it refilled so that she isn't completely off the medication. Pt has been scheduled for med refill apt on 9.23.24.

## 2023-04-14 NOTE — Telephone Encounter (Signed)
Rx sent 

## 2023-04-14 NOTE — Telephone Encounter (Signed)
Okay to refill citalopram x 2 months

## 2023-04-14 NOTE — Telephone Encounter (Signed)
Please advise 

## 2023-04-22 ENCOUNTER — Ambulatory Visit (INDEPENDENT_AMBULATORY_CARE_PROVIDER_SITE_OTHER): Payer: Medicare Other | Admitting: *Deleted

## 2023-04-22 DIAGNOSIS — Z0000 Encounter for general adult medical examination without abnormal findings: Secondary | ICD-10-CM

## 2023-04-22 NOTE — Progress Notes (Signed)
Subjective:   [name] [name] is a 72 y.o. female who presents for Medicare Annual (Subsequent) preventive examination.  Visit Complete: Virtual  I connected with  [name] [name] on 04/22/23 by a audio enabled telemedicine application and verified that I am speaking with the correct person using two identifiers.  Patient Location: Home  Provider Location: Office/Clinic  I discussed the limitations of evaluation and management by telemedicine. The patient expressed understanding and agreed to proceed.  Review of Systems     Cardiac Risk Factors include: advanced age (>6men, >74 women);dyslipidemia;hypertension     Objective:    Vital Signs: Unable to obtain new vitals due to this being a telehealth visit.      04/22/2023    1:22 PM 12/17/2022    7:10 PM 09/29/2022    7:08 AM 09/18/2022   10:09 AM 09/07/2022    8:04 PM 09/06/2022    2:56 PM 11/05/2021    3:25 PM  Advanced Directives  Does Patient Have a Medical Advance Directive? Yes No No No No Yes Yes  Type of Estate agent of [place];Living will     Living will Healthcare Power of Attorney  Does patient want to make changes to medical advance directive? No - Patient declined        Copy of Healthcare Power of Attorney in Chart? No - copy requested      No - copy requested  Would patient like information on creating a medical advance directive?    No - Patient declined No - Patient declined      Current Medications (verified) Outpatient Encounter Medications as of 04/22/2023  Medication Sig   acetaminophen (TYLENOL) 500 MG tablet Take 500 mg by mouth every 6 (six) hours as needed.   aspirin EC 81 MG tablet Take 81 mg daily by mouth.   carvedilol (COREG) 6.25 MG tablet TAKE 1 TABLET(6.25 MG) BY MOUTH TWICE DAILY WITH A MEAL   citalopram (CELEXA) 20 MG tablet Take 1 tablet (20 mg total) by mouth daily.   Evolocumab (REPATHA SURECLICK) 140 MG/ML SOAJ INJECT 140MG  UNDER THE SKIN ONCE EVERY  14 DAYS   Evolocumab (REPATHA SURECLICK) 140 MG/ML SOAJ Inject 140 mg into the skin every 14 (fourteen) days.   raloxifene (EVISTA) 60 MG tablet Take 1 tablet (60 mg total) by mouth daily.   rosuvastatin (CRESTOR) 40 MG tablet TAKE 1 TABLET(40 MG) BY MOUTH AT BEDTIME   [DISCONTINUED] Cholecalciferol (VITAMIN D3) 2000 UNITS capsule Take 4,000 Units by mouth daily.  (Patient not taking: Reported on 11/05/2021)   [DISCONTINUED] cyanocobalamin 1000 MCG tablet Take 2 tablets by mouth daily. (Patient not taking: Reported on 12/16/2021)   [DISCONTINUED] folic acid (FOLVITE) 1 MG tablet Take 1 tablet (1 mg total) by mouth daily. (Patient not taking: Reported on 11/05/2021)   [DISCONTINUED] thiamine 100 MG tablet Take 1 tablet (100 mg total) by mouth daily. (Patient not taking: Reported on 11/05/2021)   No facility-administered encounter medications on file as of 04/22/2023.    Allergies (verified) Tramadol hcl, Codeine, and Penicillins   History: Past Medical History:  Diagnosis Date   Anemia    duering cancer treatment    Anxiety    Arthritis    OA hands    Bilateral lower extremity pain    Blood transfusion without reported diagnosis    during chemo for lung cancer   Cataract    forming  but very small    Cholelithiasis    Colon polyps  adenomatous   Concussion 08/25/2021   ETOHdependence (HCC)    High cholesterol    History of chemotherapy    for lung cancer- completed 2013   Hx of radiation therapy 10/07/11 to 11/20/11   right lung   Hx of radiation therapy 01/07/2012- 01/21/12   cranial irradiation   Hypertension    Lung cancer (HCC) 09/19/2011   Stage III currently in remission   Malignant melanoma of skin of canthus of right eye (HCC) 02/2017   Osteoporosis 04/2014   Dexa scan by Dr. Rana Snare, T-score -2.7, next in 2 years, with at least 13 % major fracture in 10 years   Pitting edema    Bilateral lower extremities, duplex ultrasound 04/03/2015 normal, no DVT   Past Surgical  History:  Procedure Laterality Date   CHOLECYSTECTOMY  04/2011   biliary stent placement   CLOSED REDUCTION FINGER WITH PERCUTANEOUS PINNING Left 10/23/2021   Procedure: LEFT INDEX, MIDDLE, RING, AND SMALL FINGER CLOSED REDUCTION FINGER WITH PERCUTANEOUS PINNING;  Surgeon: Marlyne Beards, MD;  Location: [facility];  Service: Orthopedics;  Laterality: Left;   COLONOSCOPY     INCISIONAL HERNIA REPAIR  2015   IR KYPHO LUMBAR INC FX REDUCE BONE BX UNI/BIL CANNULATION INC/IMAGING  09/18/2022   MOHS SURGERY Right 02/2017   cheek and eyelid   Drue Stager Touch     Vaginal procedure   POLYPECTOMY     TUBAL LIGATION     Family History  Problem Relation Age of Onset   Bladder Cancer Father    Stroke Father    Colon polyps Father    Atrial fibrillation Mother    Heart disease Mother        CHF   Clotting disorder Mother        warfarin   Colon polyps Mother    Colon cancer Maternal Aunt 5   Drug abuse Son    Breast cancer Neg Hx    Esophageal cancer Neg Hx    Stomach cancer Neg Hx    Rectal cancer Neg Hx    Social History   Socioeconomic History   Marital status: Divorced    Spouse name: Not on file   Number of children: 2   Years of education: Not on file   Highest education level: Not on file  Occupational History   Occupation: retired, Engineering geologist  Tobacco Use   Smoking status: Former    Current packs/day: 0.00    Types: Cigarettes    Quit date: 09/08/2011    Years since quitting: 11.6   Smokeless tobacco: Never  Vaping Use   Vaping status: Never Used  Substance and Sexual Activity   Alcohol use: Yes    Comment: daily bottle of wine   Drug use: No   Sexual activity: Not on file  Other Topics Concern   Not on file  Social History Narrative   Divorced   Lost 1 child June 2019 (drugs)   Living child w/ drug addiction   Lives by herself       Social Determinants of Health   Financial Resource Strain: Low Risk  (04/22/2023)   Overall Financial Resource  Strain (CARDIA)    Difficulty of Paying Living Expenses: Not hard at all  Food Insecurity: No Food Insecurity (04/22/2023)   Hunger Vital Sign    Worried About Running Out of Food in the Last Year: Never true    Ran Out of Food in the Last Year: Never true  Transportation Needs:  No Transportation Needs (04/22/2023)   PRAPARE - Administrator, Civil Service (Medical): No    Lack of Transportation (Non-Medical): No  Physical Activity: Inactive (04/22/2023)   Exercise Vital Sign    Days of Exercise per Week: 0 days    Minutes of Exercise per Session: 0 min  Stress: No Stress Concern Present (04/22/2023)   [employer] of Occupational Health - Occupational Stress Questionnaire    Feeling of Stress : Not at all  Social Connections: Socially Isolated (04/22/2023)   Social Connection and Isolation Panel [NHANES]    Frequency of Communication with Friends and Family: More than three times a week    Frequency of Social Gatherings with Friends and Family: Once a week    Attends Religious Services: Never    Database administrator or Organizations: No    Attends Engineer, structural: Never    Marital Status: Divorced    Tobacco Counseling Counseling given: Not Answered   Clinical Intake:  Pre-visit preparation completed: Yes  Pain : No/denies pain  Nutritional Risks: None Diabetes: No  How often do you need to have someone help you when you read instructions, pamphlets, or other written materials from your doctor or pharmacy?: 1 - Never  Interpreter Needed?: No  Information entered by :: [employer], CMA   Activities of Daily Living    04/22/2023    1:11 PM  In your present state of health, do you have any difficulty performing the following activities:  Hearing? 0  Vision? 0  Difficulty concentrating or making decisions? 1  Comment "sometimes"  Walking or climbing stairs? 1  Dressing or bathing? 0  Doing errands, shopping? 1  Preparing Food and  eating ? Y  Comment preparing meals  Using the Toilet? N  In the past six months, have you accidently leaked urine? Y  Do you have problems with loss of bowel control? N  Managing your Medications? N  Managing your Finances? N  Housekeeping or managing your Housekeeping? Y    Patient Care Team: [name], MD as PCP - General (Internal Medicine) Candice Camp, MD as Consulting Physician (Obstetrics and Gynecology) Rennis Golden Lisette Abu, MD as Consulting Physician (Cardiology)  Indicate any recent Medical Services you may have received from other than [facility] providers in the past year (date may be approximate).     Assessment:   This is a routine wellness examination for [place].  Hearing/Vision screen No results found.  Dietary issues and exercise activities discussed:     Goals Addressed   None    Depression Screen    04/22/2023    1:27 PM 07/21/2022    3:20 PM 07/21/2022    3:14 PM 03/19/2022    3:12 PM 12/16/2021    2:31 PM 09/17/2021    1:02 PM 12/10/2020    3:43 PM  PHQ 2/9 Scores  PHQ - 2 Score 0   2 0 0 0  PHQ- 9 Score    7 0 0 2  Exception Documentation  Patient refusal Patient refusal        Fall Risk    04/22/2023    1:17 PM 07/21/2022    2:51 PM 03/19/2022    2:38 PM 12/16/2021    2:32 PM 09/17/2021    1:02 PM  Fall Risk   Falls in the past year? 1 0 0 1 1  Number falls in past yr: 1 0 0 0 0  Injury with Fall? 1 0  0 1 1  Risk for fall due to : History of fall(s);Impaired balance/gait      Follow up Falls evaluation completed Falls evaluation completed Falls evaluation completed Falls evaluation completed Falls evaluation completed    MEDICARE RISK AT HOME: Medicare Risk at Home Any stairs in or around the home?: No If so, are there any without handrails?: No Home free of loose throw rugs in walkways, pet beds, electrical cords, etc?: Yes Adequate lighting in your home to reduce risk of falls?: Yes Life alert?: No Use of a cane, walker or w/c?:  Yes Grab bars in the bathroom?: Yes Shower chair or bench in shower?: Yes Elevated toilet seat or a handicapped toilet?: No  TIMED UP AND GO:  Was the test performed?  No    Cognitive Function:    04/22/2023    1:46 PM 12/16/2021    2:54 PM  MMSE - Mini Mental State Exam  Not completed: Unable to complete   Orientation to time  5  Orientation to Place  5  Registration  3  Attention/ Calculation  2  Recall  2  Language- name 2 objects  2  Language- repeat  1  Language- follow 3 step command  3  Language- read & follow direction  1  Write a sentence  1  Copy design  1  Total score  26        Immunizations Immunization History  Administered Date(s) Administered   Fluad Quad(high Dose 65+) 08/06/2020, 09/17/2021   Influenza Whole 07/02/2009, 06/24/2010   Influenza, High Dose Seasonal PF 07/21/2016, 07/13/2018, 07/03/2019   Influenza,inj,Quad PF,6+ Mos 06/07/2014, 07/09/2017   Influenza-Unspecified 05/25/2022   Moderna SARS-COV2 Booster Vaccination 12/10/2020   Moderna Sars-Covid-2 Vaccination 10/15/2019, 11/12/2019, 08/30/2020   Pneumococcal Conjugate-13 02/14/2015   Pneumococcal Polysaccharide-23 08/25/2001, 07/09/2017   Td 05/18/2007   Tdap 03/11/2018   Zoster Recombinant(Shingrix) 05/16/2020   Zoster, Live 12/03/2012    TDAP status: Up to date  Flu Vaccine status: Due, Education has been provided regarding the importance of this vaccine. Advised may receive this vaccine at local pharmacy or Health Dept. Aware to provide a copy of the vaccination record if obtained from local pharmacy or Health Dept. Verbalized acceptance and understanding.  Pneumococcal vaccine status: Due, Education has been provided regarding the importance of this vaccine. Advised may receive this vaccine at local pharmacy or Health Dept. Aware to provide a copy of the vaccination record if obtained from local pharmacy or Health Dept. Verbalized acceptance and understanding.  Covid-19 vaccine  status: Information provided on how to obtain vaccines.   Qualifies for Shingles Vaccine? Yes   Zostavax completed Yes   Shingrix Completed?: Yes  Screening Tests Health Maintenance  Topic Date Due   Zoster Vaccines- Shingrix (2 of 2) 07/11/2020   Medicare Annual Wellness (AWV)  08/10/2020   INFLUENZA VACCINE  03/26/2023   Pneumonia Vaccine 51+ Years old (3 of 3 - PPSV23 or PCV20) 07/22/2023 (Originally 07/09/2022)   MAMMOGRAM  07/24/2023   PAP SMEAR-Modifier  07/24/2023   Colonoscopy  09/07/2026   DTaP/Tdap/Td (3 - Td or Tdap) 03/11/2028   DEXA SCAN  Completed   Hepatitis C Screening  Completed   HPV VACCINES  Aged Out   COVID-19 Vaccine  Discontinued    Health Maintenance  Health Maintenance Due  Topic Date Due   Zoster Vaccines- Shingrix (2 of 2) 07/11/2020   Medicare Annual Wellness (AWV)  08/10/2020   INFLUENZA VACCINE  03/26/2023    Colorectal  cancer screening: Type of screening: Colonoscopy. Completed 09/08/19. Repeat every 7 years  Mammogram status: Completed 07/23/21. Repeat every year Ordered by Dr. Jonna Munro Density screen: Bone Density status: pt stated that Dr. Rana Snare (OBGYN) has been doing der dexa scans in his office. Records request sent.  Lung Cancer Screening: (Low Dose CT Chest recommended if Age 23-80 years, 20 pack-year currently smoking OR have quit w/in 15years.) does not qualify.    Additional Screening:  Hepatitis C Screening: does qualify; Completed 05/22/15  Vision Screening: Recommended annual ophthalmology exams for early detection of glaucoma and other disorders of the eye. Is the patient up to date with their annual eye exam?  Yes  Who is the provider or what is the name of the office in which the patient attends annual eye exams? Can't remember name at this time If pt is not established with a provider, would they like to be referred to a provider to establish care? No .   Dental Screening: Recommended annual dental exams for proper oral  hygiene  Diabetic Foot Exam: N/a  Community Resource Referral / Chronic Care Management: CRR required this visit?  No   CCM required this visit?  No     Plan:     I have personally reviewed and noted the following in the patient's chart:   Medical and social history Use of alcohol, tobacco or illicit drugs  Current medications and supplements including opioid prescriptions. Patient is not currently taking opioid prescriptions. Functional ability and status Nutritional status Physical activity Advanced directives List of other physicians Hospitalizations, surgeries, and ER visits in previous 12 months Vitals Screenings to include cognitive, depression, and falls Referrals and appointments  In addition, I have reviewed and discussed with patient certain preventive protocols, quality metrics, and best practice recommendations. A written personalized care plan for preventive services as well as general preventive health recommendations were provided to patient.     Donne Anon, CMA   04/22/2023   After Visit Summary: Pt declined  Nurse Notes: None

## 2023-04-22 NOTE — Patient Instructions (Addendum)
[name] , Thank you for taking time to come for your Medicare Wellness Visit. I appreciate your ongoing commitment to your health goals. Please review the following plan we discussed and let me know if I can assist you in the future.      This is a list of the screening recommended for you and due dates:  Health Maintenance  Topic Date Due   Zoster (Shingles) Vaccine (2 of 2) 07/11/2020   Flu Shot  03/26/2023   Pneumonia Vaccine (3 of 3 - PPSV23 or PCV20) 07/22/2023*   Mammogram  07/24/2023   Pap Smear  07/24/2023   Medicare Annual Wellness Visit  04/21/2024   Colon Cancer Screening  09/07/2026   DTaP/Tdap/Td vaccine (3 - Td or Tdap) 03/11/2028   DEXA scan (bone density measurement)  Completed   Hepatitis C Screening  Completed   HPV Vaccine  Aged Out   COVID-19 Vaccine  Discontinued  *Topic was postponed. The date shown is not the original due date.    Next appointment: Follow up in one year for your annual wellness visit.   Preventive Care 66 Years and Older, Female Preventive care refers to lifestyle choices and visits with your health care provider that can promote health and wellness. What does preventive care include? A yearly physical exam. This is also called an annual well check. Dental exams once or twice a year. Routine eye exams. Ask your health care provider how often you should have your eyes checked. Personal lifestyle choices, including: Daily care of your teeth and gums. Regular physical activity. Eating a healthy diet. Avoiding tobacco and drug use. Limiting alcohol use. Practicing safe sex. Taking low-dose aspirin every day. Taking vitamin and mineral supplements as recommended by your health care provider. What happens during an annual well check? The services and screenings done by your health care provider during your annual well check will depend on your age, overall health, lifestyle risk factors, and family history of disease. Counseling   Your health care provider may ask you questions about your: Alcohol use. Tobacco use. Drug use. Emotional well-being. Home and relationship well-being. Sexual activity. Eating habits. History of falls. Memory and ability to understand (cognition). Work and work Astronomer. Reproductive health. Screening  You may have the following tests or measurements: Height, weight, and BMI. Blood pressure. Lipid and cholesterol levels. These may be checked every 5 years, or more frequently if you are over 20 years old. Skin check. Lung cancer screening. You may have this screening every year starting at age 88 if you have a 30-pack-year history of smoking and currently smoke or have quit within the past 15 years. Fecal occult blood test (FOBT) of the stool. You may have this test every year starting at age 86. Flexible sigmoidoscopy or colonoscopy. You may have a sigmoidoscopy every 5 years or a colonoscopy every 10 years starting at age 67. Hepatitis C blood test. Hepatitis B blood test. Sexually transmitted disease (STD) testing. Diabetes screening. This is done by checking your blood sugar (glucose) after you have not eaten for a while (fasting). You may have this done every 1-3 years. Bone density scan. This is done to screen for osteoporosis. You may have this done starting at age 41. Mammogram. This may be done every 1-2 years. Talk to your health care provider about how often you should have regular mammograms. Talk with your health care provider about your test results, treatment options, and if necessary, the need for more tests. Vaccines  Your health care provider may recommend certain vaccines, such as: Influenza vaccine. This is recommended every year. Tetanus, diphtheria, and acellular pertussis (Tdap, Td) vaccine. You may need a Td booster every 10 years. Zoster vaccine. You may need this after age 67. Pneumococcal 13-valent conjugate (PCV13) vaccine. One dose is recommended  after age 80. Pneumococcal polysaccharide (PPSV23) vaccine. One dose is recommended after age 57. Talk to your health care provider about which screenings and vaccines you need and how often you need them. This information is not intended to replace advice given to you by your health care provider. Make sure you discuss any questions you have with your health care provider. Document Released: 09/07/2015 Document Revised: 04/30/2016 Document Reviewed: 06/12/2015 Elsevier Interactive Patient Education  2017 [employer].  Fall Prevention in the Home Falls can cause injuries. They can happen to people of all ages. There are many things you can do to make your home safe and to help prevent falls. What can I do on the outside of my home? Regularly fix the edges of walkways and driveways and fix any cracks. Remove anything that might make you trip as you walk through a door, such as a raised step or threshold. Trim any bushes or trees on the path to your home. Use bright outdoor lighting. Clear any walking paths of anything that might make someone trip, such as rocks or tools. Regularly check to see if handrails are loose or broken. Make sure that both sides of any steps have handrails. Any raised decks and porches should have guardrails on the edges. Have any leaves, snow, or ice cleared regularly. Use sand or salt on walking paths during winter. Clean up any spills in your garage right away. This includes oil or grease spills. What can I do in the bathroom? Use night lights. Install grab bars by the toilet and in the tub and shower. Do not use towel bars as grab bars. Use non-skid mats or decals in the tub or shower. If you need to sit down in the shower, use a plastic, non-slip stool. Keep the floor dry. Clean up any water that spills on the floor as soon as it happens. Remove soap buildup in the tub or shower regularly. Attach bath mats securely with double-sided non-slip rug tape. Do not  have throw rugs and other things on the floor that can make you trip. What can I do in the bedroom? Use night lights. Make sure that you have a light by your bed that is easy to reach. Do not use any sheets or blankets that are too big for your bed. They should not hang down onto the floor. Have a firm chair that has side arms. You can use this for support while you get dressed. Do not have throw rugs and other things on the floor that can make you trip. What can I do in the kitchen? Clean up any spills right away. Avoid walking on wet floors. Keep items that you use a lot in easy-to-reach places. If you need to reach something above you, use a strong step stool that has a grab bar. Keep electrical cords out of the way. Do not use floor polish or wax that makes floors slippery. If you must use wax, use non-skid floor wax. Do not have throw rugs and other things on the floor that can make you trip. What can I do with my stairs? Do not leave any items on the stairs. Make sure that there are  handrails on both sides of the stairs and use them. Fix handrails that are broken or loose. Make sure that handrails are as long as the stairways. Check any carpeting to make sure that it is firmly attached to the stairs. Fix any carpet that is loose or worn. Avoid having throw rugs at the top or bottom of the stairs. If you do have throw rugs, attach them to the floor with carpet tape. Make sure that you have a light switch at the top of the stairs and the bottom of the stairs. If you do not have them, ask someone to add them for you. What else can I do to help prevent falls? Wear shoes that: Do not have high heels. Have rubber bottoms. Are comfortable and fit you well. Are closed at the toe. Do not wear sandals. If you use a stepladder: Make sure that it is fully opened. Do not climb a closed stepladder. Make sure that both sides of the stepladder are locked into place. Ask someone to hold it for  you, if possible. Clearly mark and make sure that you can see: Any grab bars or handrails. First and last steps. Where the edge of each step is. Use tools that help you move around (mobility aids) if they are needed. These include: Canes. Walkers. Scooters. Crutches. Turn on the lights when you go into a dark area. Replace any light bulbs as soon as they burn out. Set up your furniture so you have a clear path. Avoid moving your furniture around. If any of your floors are uneven, fix them. If there are any pets around you, be aware of where they are. Review your medicines with your doctor. Some medicines can make you feel dizzy. This can increase your chance of falling. Ask your doctor what other things that you can do to help prevent falls. This information is not intended to replace advice given to you by your health care provider. Make sure you discuss any questions you have with your health care provider. Document Released: 06/07/2009 Document Revised: 01/17/2016 Document Reviewed: 09/15/2014 Elsevier Interactive Patient Education  2017 [employer].

## 2023-05-18 ENCOUNTER — Ambulatory Visit: Payer: Medicare Other | Admitting: Internal Medicine

## 2023-06-05 ENCOUNTER — Other Ambulatory Visit: Payer: Self-pay | Admitting: Internal Medicine

## 2023-07-06 ENCOUNTER — Other Ambulatory Visit: Payer: Self-pay | Admitting: Internal Medicine

## 2023-07-08 ENCOUNTER — Telehealth: Payer: Self-pay | Admitting: Internal Medicine

## 2023-07-08 NOTE — Telephone Encounter (Signed)
Pt has not been seen since 2023- unfortunately we won't be able to refill meds until she has been seen.

## 2023-07-08 NOTE — Telephone Encounter (Signed)
[name] [name] called back to check status and advised her of the info. Pt was scheduled for 11.18.24 and [name] [name] asked if a script could be sent in to hold her over till then.

## 2023-07-08 NOTE — Telephone Encounter (Signed)
**  Jasmine December (sister DPR OK) called stating that she is having difficulties getting pt to come back in for an appt. She was wondering if she could get her a short term supply until she can get her in.**  Prescription Request  07/08/2023  Is this a "Controlled Substance" medicine? Yes  LOV: Visit date not found  What is the name of the medication or equipment?   citalopram (CELEXA) 20 MG tablet [[id number]]  Have you contacted your pharmacy to request a refill? No   Which pharmacy would you like this sent to?  [id number] Pura Spice,  - [address] AT [facility] [address] [place] [address] Phone: [phone] Fax: [phone]    Patient notified that their request is being sent to the clinical staff for review and that they should receive a response within 2 business days.   Please advise at Mobile [phone] (mobile)

## 2023-07-13 ENCOUNTER — Ambulatory Visit (INDEPENDENT_AMBULATORY_CARE_PROVIDER_SITE_OTHER): Payer: Medicare Other | Admitting: Internal Medicine

## 2023-07-13 ENCOUNTER — Encounter: Payer: Self-pay | Admitting: Internal Medicine

## 2023-07-13 VITALS — BP 126/68 | HR 105 | Temp 97.9°F | Resp 16 | Ht 67.0 in | Wt 146.5 lb

## 2023-07-13 DIAGNOSIS — I10 Essential (primary) hypertension: Secondary | ICD-10-CM

## 2023-07-13 DIAGNOSIS — Z23 Encounter for immunization: Secondary | ICD-10-CM

## 2023-07-13 DIAGNOSIS — M810 Age-related osteoporosis without current pathological fracture: Secondary | ICD-10-CM

## 2023-07-13 DIAGNOSIS — M79604 Pain in right leg: Secondary | ICD-10-CM

## 2023-07-13 DIAGNOSIS — M79605 Pain in left leg: Secondary | ICD-10-CM

## 2023-07-13 DIAGNOSIS — G3184 Mild cognitive impairment, so stated: Secondary | ICD-10-CM

## 2023-07-13 DIAGNOSIS — F1029 Alcohol dependence with unspecified alcohol-induced disorder: Secondary | ICD-10-CM | POA: Diagnosis not present

## 2023-07-13 DIAGNOSIS — R7989 Other specified abnormal findings of blood chemistry: Secondary | ICD-10-CM | POA: Diagnosis not present

## 2023-07-13 LAB — T4, FREE: Free T4: 0.71 ng/dL (ref 0.60–1.60)

## 2023-07-13 LAB — VITAMIN B12: Vitamin B-12: 222 pg/mL (ref 211–911)

## 2023-07-13 LAB — TSH: TSH: 2.58 u[IU]/mL (ref 0.35–5.50)

## 2023-07-13 MED ORDER — CITALOPRAM HYDROBROMIDE 20 MG PO TABS: 20.0000 mg | ORAL_TABLET | Freq: Every day | ORAL | 1 refills | Status: DC

## 2023-07-13 MED ORDER — DONEPEZIL HCL 5 MG PO TABS: 5.0000 mg | ORAL_TABLET | Freq: Every day | ORAL | 1 refills | Status: DC

## 2023-07-13 NOTE — Patient Instructions (Addendum)
Vaccines I recommend: Shingrix (shingles) #2 COVID-vaccine  Will arrange a study on your legs to check your circulation  We are referring you to Dr. Rana Snare for osteoporosis  GO TO THE LAB : Get the blood work     Next visit with me in 3 months   Please schedule it at the front desk

## 2023-07-13 NOTE — Progress Notes (Unsigned)
Subjective:    Patient ID: [name] [name], female    DOB: [birth year], 72 y.o.   MRN: [id number]  DOS:  07/13/2023 Type of visit - description: Routine follow-up, here with Selena Batten, her caregiver.  Notes from oncology and cardiology reviewed. Chronic medical problems addressed. She complained of bilateral calf pain for the last 2 to 3 weeks. Mostly with walking. Denies any leg swelling or redness.  No recent injury. Has some back pain on and off but no recent sxs .   Review of Systems See above   Past Medical History:  Diagnosis Date   Anemia    duering cancer treatment    Anxiety    Arthritis    OA hands    Bilateral lower extremity pain    Blood transfusion without reported diagnosis    during chemo for lung cancer   Cataract    forming  but very small    Cholelithiasis    Colon polyps    adenomatous   Concussion 08/25/2021   ETOHdependence (HCC)    High cholesterol    History of chemotherapy    for lung cancer- completed 2013   Hx of radiation therapy 10/07/11 to 11/20/11   right lung   Hx of radiation therapy 01/07/2012- 01/21/12   cranial irradiation   Hypertension    Lung cancer (HCC) 09/19/2011   Stage III currently in remission   Malignant melanoma of skin of canthus of right eye (HCC) 02/2017   Osteoporosis 04/2014   Dexa scan by Dr. Rana Snare, T-score -2.7, next in 2 years, with at least 13 % major fracture in 10 years   Pitting edema    Bilateral lower extremities, duplex ultrasound 04/03/2015 normal, no DVT    Past Surgical History:  Procedure Laterality Date   CHOLECYSTECTOMY  04/2011   biliary stent placement   CLOSED REDUCTION FINGER WITH PERCUTANEOUS PINNING Left 10/23/2021   Procedure: LEFT INDEX, MIDDLE, RING, AND SMALL FINGER CLOSED REDUCTION FINGER WITH PERCUTANEOUS PINNING;  Surgeon: Marlyne Beards, MD;  Location: [facility];  Service: Orthopedics;  Laterality: Left;   COLONOSCOPY     INCISIONAL HERNIA REPAIR  2015   IR KYPHO  LUMBAR INC FX REDUCE BONE BX UNI/BIL CANNULATION INC/IMAGING  09/18/2022   MOHS SURGERY Right 02/2017   cheek and eyelid   Drue Stager Touch     Vaginal procedure   POLYPECTOMY     TUBAL LIGATION      Current Outpatient Medications  Medication Instructions   acetaminophen (TYLENOL) 500 mg, Oral, Every 6 hours PRN   aspirin EC 81 mg, Oral, Daily   carvedilol (COREG) 6.25 MG tablet TAKE 1 TABLET(6.25 MG) BY MOUTH TWICE DAILY WITH A MEAL   cimetidine (TAGAMET) 200 mg, Oral, 2 times daily   citalopram (CELEXA) 20 mg, Oral, Daily   Evolocumab (REPATHA SURECLICK) 140 MG/ML SOAJ INJECT 140MG  UNDER THE SKIN ONCE EVERY 14 DAYS   raloxifene (EVISTA) 60 mg, Oral, Daily   rosuvastatin (CRESTOR) 40 MG tablet TAKE 1 TABLET(40 MG) BY MOUTH AT BEDTIME       Objective:   Physical Exam BP 126/68   Pulse (!) 105   Temp 97.9 F (36.6 C) (Oral)   Resp 16   Ht 5\' 7"  (1.702 m)   Wt 146 lb 8 oz (66.5 kg)   SpO2 97%   BMI 22.95 kg/m  General:   Well developed, NAD, BMI noted. HEENT:  Normocephalic . Face symmetric, atraumatic Lungs:  CTA B Normal  respiratory effort, no intercostal retractions, no accessory muscle use. Heart: RRR,  no murmur.  Lower extremities: no pretibial edema bilaterally.  Pedal pulses: Decreased.  Femoral pulses: Declined Skin: Not pale. Not jaundice Neurologic:  alert & oriented to self, partially to time and place. Speech normal, gait assisted by a walker, could not transfer to the examining table. Psych--    Behavior appropriate. No anxious or depressed appearing.      Assessment     ASSESSMENT  HTN Hyperlipidemia -- cramps with Lipitor dc 2015 Depression . Anxiety (  avoid benzos d/t etoh Osteoporosis-- DEXAs  at gyn --->  was on boniva, now evista  B12 deficiency dx 01/2014 Vit D  def  COPD: Mild last PFTs  CV: Extensive coronary artery calcifications Oncology: -Lung cancer SCC DX 2013, XRT chest and brain -melanoma 2018, R face, s/p Mohs Lower  extremity edema [place] (-) for DVT 03-2015 Coronary calcifications per CT chest 6 2016 IBS, diarrhea predominant   on Imodium as needed; GI 2015-- rx questran, self d/c d/t constipation ASA intolerant (tinnitus) but ok  taking low-dose Admitted 07-2020: Had a fall, L pubic rami fracture Admitted 08-2021: Fall, headache, EtOH, MS changes    Plan: HTN: BP today is very good, on carvedilol. MCI: See physical exam, needs MMSE but declined to see one of our psychologist to perform it.  Will do it when she comes back.  In the meantime we agreed to start Aricept, what  to expect some potential side effect discussed with the patient. Labs for reversible causes done. EtOH: Still drinking, 3 glasses of wine at night Osteoporosis: Per gynecology, has not seen them in a while, referral sent.on Evista Lung cancer: LOV w/ oncology 01/2023, felt to be stable History of increased TSH: checkTFTs Vascular: Coronary calcifications, dyslipidemia, last seen by cardiology 01/07/2023. On Repatha and rosuvastatin. Leg pain: Calf pain for 2 weeks, pedal pulses slightly decreased?.  Check ABIs. Preventive care: PNM 20 today, see AVS. RTC 3 months

## 2023-07-14 NOTE — Assessment & Plan Note (Signed)
HTN: BP today is very good, on carvedilol. MCI: See physical exam, needs MMSE but declined to see one of our psychologist to perform it.  Will do it when she comes back.  In the meantime we agreed to start Aricept, what  to expect some potential side effect discussed with the patient. Labs for reversible causes done. EtOH: Still drinking, 3 glasses of wine at night Osteoporosis: Per gynecology, has not seen them in a while, referral sent.on Evista Lung cancer: LOV w/ oncology 01/2023, felt to be stable History of increased TSH: checkTFTs Vascular: Coronary calcifications, dyslipidemia, last seen by cardiology 01/07/2023. On Repatha and rosuvastatin. Leg pain: Calf pain for 2 weeks, pedal pulses slightly decreased?.  Check ABIs. Preventive care: PNM 20 today, see AVS. RTC 3 months

## 2023-07-15 LAB — HOMOCYSTEINE: Homocysteine: 13.5 umol/L — ABNORMAL HIGH (ref <10.4)

## 2023-07-15 LAB — RPR: RPR Ser Ql: NONREACTIVE

## 2023-07-17 ENCOUNTER — Other Ambulatory Visit: Payer: Self-pay

## 2023-07-17 ENCOUNTER — Encounter (HOSPITAL_BASED_OUTPATIENT_CLINIC_OR_DEPARTMENT_OTHER): Payer: Self-pay | Admitting: Emergency Medicine

## 2023-07-17 ENCOUNTER — Emergency Department (HOSPITAL_BASED_OUTPATIENT_CLINIC_OR_DEPARTMENT_OTHER): Payer: Medicare Other

## 2023-07-17 ENCOUNTER — Emergency Department (HOSPITAL_BASED_OUTPATIENT_CLINIC_OR_DEPARTMENT_OTHER)
Admission: EM | Admit: 2023-07-17 | Discharge: 2023-07-17 | Disposition: A | Discharge status: Home / Self Care | Payer: Medicare Other | Attending: Emergency Medicine | Admitting: Emergency Medicine

## 2023-07-17 ENCOUNTER — Other Ambulatory Visit: Payer: Self-pay | Admitting: Internal Medicine

## 2023-07-17 DIAGNOSIS — Z7982 Long term (current) use of aspirin: Secondary | ICD-10-CM | POA: Diagnosis not present

## 2023-07-17 DIAGNOSIS — M51369 Other intervertebral disc degeneration, lumbar region without mention of lumbar back pain or lower extremity pain: Secondary | ICD-10-CM | POA: Diagnosis not present

## 2023-07-17 DIAGNOSIS — M47816 Spondylosis without myelopathy or radiculopathy, lumbar region: Secondary | ICD-10-CM | POA: Diagnosis not present

## 2023-07-17 DIAGNOSIS — S32592A Other specified fracture of left pubis, initial encounter for closed fracture: Secondary | ICD-10-CM | POA: Diagnosis not present

## 2023-07-17 DIAGNOSIS — M549 Dorsalgia, unspecified: Secondary | ICD-10-CM | POA: Diagnosis not present

## 2023-07-17 DIAGNOSIS — M5459 Other low back pain: Secondary | ICD-10-CM | POA: Diagnosis not present

## 2023-07-17 DIAGNOSIS — M545 Low back pain, unspecified: Secondary | ICD-10-CM | POA: Insufficient documentation

## 2023-07-17 DIAGNOSIS — M47817 Spondylosis without myelopathy or radiculopathy, lumbosacral region: Secondary | ICD-10-CM | POA: Diagnosis not present

## 2023-07-17 DIAGNOSIS — G8929 Other chronic pain: Secondary | ICD-10-CM | POA: Diagnosis not present

## 2023-07-17 MED ORDER — LIDOCAINE 5 % EX PTCH: 1.0000 | MEDICATED_PATCH | CUTANEOUS | 0 refills | Status: DC

## 2023-07-17 MED ORDER — PREDNISONE 20 MG PO TABS: ORAL_TABLET | ORAL | 0 refills | Status: DC

## 2023-07-17 MED ORDER — LIDOCAINE 5 % EX PTCH
3.0000 | MEDICATED_PATCH | CUTANEOUS | Status: DC
  Administered 2023-07-17: 3 via TRANSDERMAL
  Filled 2023-07-17: qty 3

## 2023-07-17 MED ORDER — KETOROLAC TROMETHAMINE 60 MG/2ML IM SOLN
30.0000 mg | Freq: Once | INTRAMUSCULAR | Status: AC
  Administered 2023-07-17: 30 mg via INTRAMUSCULAR
  Filled 2023-07-17: qty 2

## 2023-07-17 NOTE — ED Provider Notes (Signed)
Patient wants oxycontin.  Patient states " you said she was nice" (referring to this Clinical research associate to the nurse present).  EDP politely declined given her frequent falls.  EDP will not be prescribing narcotic pain medication given patient's ongoing alcohol use and frequent falls associated with alcohol use.     Kinley Ferrentino, MD 07/17/23 [phone]

## 2023-07-17 NOTE — ED Provider Notes (Addendum)
[facility] Provider Note   CSN: [id number] Arrival date & time: 07/17/23  0436     History  Chief Complaint  Patient presents with   Back Pain    [name] [name] is a 72 y.o. female.  The history is provided by the patient.  Back Pain Pain location: At the L-S junction. Pain severity:  Severe Pain is:  Same all the time Onset quality:  Sudden Duration:  1 week Timing:  Constant Progression:  Unchanged Chronicity: acute on chronic. Patient with a history of chronic back pain presents 1 week post fall with worsening back pain.  No weakness, no numbness no changes in bowel or bladder habits.       Home Medications Prior to Admission medications   Medication Sig Start Date End Date Taking? Authorizing Provider  lidocaine (LIDODERM) 5 % Place 1 patch onto the skin daily. Remove & Discard patch within 12 hours or as directed by MD 07/17/23  Yes Dreyden Rohrman, MD  acetaminophen (TYLENOL) 500 MG tablet Take 500 mg by mouth every 6 (six) hours as needed.    [provider]  aspirin EC 81 MG tablet Take 81 mg daily by mouth.    [provider]  carvedilol (COREG) 6.25 MG tablet TAKE 1 TABLET(6.25 MG) BY MOUTH TWICE DAILY WITH A MEAL 11/30/22   Worthy Rancher B, FNP  cimetidine (TAGAMET) 200 MG tablet Take 200 mg by mouth 2 (two) times daily.    [provider]  citalopram (CELEXA) 20 MG tablet Take 1 tablet (20 mg total) by mouth daily. 07/13/23   [name], MD  donepezil (ARICEPT) 5 MG tablet Take 1 tablet (5 mg total) by mouth at bedtime. 07/13/23   [name], MD  Evolocumab (REPATHA SURECLICK) 140 MG/ML SOAJ INJECT 140MG  UNDER THE SKIN ONCE EVERY 14 DAYS 01/07/23   Chrystie Nose, MD  raloxifene (EVISTA) 60 MG tablet Take 1 tablet (60 mg total) by mouth daily. 04/03/20   Hilty, Lisette Abu, MD  rosuvastatin (CRESTOR) 40 MG tablet TAKE 1 TABLET(40 MG) BY MOUTH AT BEDTIME 02/02/23   Hilty, Lisette Abu,  MD      Allergies    Tramadol hcl, Codeine, and Penicillins    Review of Systems   Review of Systems  Musculoskeletal:  Positive for back pain.    Physical Exam Updated Vital Signs BP 137/88   Pulse 97   Temp 98.4 F (36.9 C) (Oral)   Resp 16   Ht 5\' 7"  (1.702 m)   Wt 66 kg   SpO2 94%   BMI 22.79 kg/m  Physical Exam Vitals and nursing note reviewed. Exam conducted with a chaperone present.  Constitutional:      General: She is not in acute distress.    Appearance: She is well-developed.  HENT:     Head: Normocephalic and atraumatic.     Nose: Nose normal.  Eyes:     Pupils: Pupils are equal, round, and reactive to light.  Cardiovascular:     Rate and Rhythm: Normal rate and regular rhythm.     Pulses: Normal pulses.     Heart sounds: Normal heart sounds.  Pulmonary:     Effort: Pulmonary effort is normal. No respiratory distress.     Breath sounds: Normal breath sounds.  Abdominal:     General: Bowel sounds are normal. There is no distension.     Palpations: Abdomen is soft.  Tenderness: There is no abdominal tenderness. There is no guarding or rebound.  Musculoskeletal:        General: No swelling, tenderness or deformity. Normal range of motion.     Cervical back: Normal and neck supple.     Thoracic back: Normal.     Lumbar back: Normal.     Right lower leg: No edema.     Left lower leg: No edema.     Right ankle: Normal.     Right Achilles Tendon: Normal.     Left ankle: Normal.     Left Achilles Tendon: Normal.     Right foot: Normal capillary refill. Normal pulse.     Left foot: Normal capillary refill. Normal pulse.  Skin:    General: Skin is warm and dry.     Capillary Refill: Capillary refill takes less than 2 seconds.     Findings: No erythema or rash.  Neurological:     General: No focal deficit present.     Deep Tendon Reflexes: Reflexes normal.  Psychiatric:        Thought Content: Thought content normal.     ED Results / Procedures  / Treatments   Labs (all labs ordered are listed, but only abnormal results are displayed) Labs Reviewed - No data to display  EKG None  Radiology No results found.  Procedures Procedures    Medications Ordered in ED Medications  lidocaine (LIDODERM) 5 % 3 patch (has no administration in time range)    ED Course/ Medical Decision Making/ A&P                                 Medical Decision Making Patient with fall a week ago with ongoing low back pain   Amount and/or Complexity of Data Reviewed Independent Historian:     Details: Sister see above  External Data Reviewed: radiology and notes.    Details: Previous notes and xrays reviewed  Radiology: ordered and independent interpretation performed.    Details: No acute fractures by me on XR  Risk Prescription drug management. Risk Details: Well appearing, no signs of cord compression on exam.  Imaging is benign and reassuring. FROM x 4 extremities.  Will start steroids and lidocaine, patient may also take tylenol for ongoing symptoms.  These medications will not cause problems with depth perception or balance.  Stable for discharge.  Strict return precautions    Final Clinical Impression(s) / ED Diagnoses Final diagnoses:  None   I have reviewed the triage vital signs and the nursing notes. Pertinent labs & imaging results that were available during my care of the patient were reviewed by me and considered in my medical decision making (see chart for details). After history, exam, and medical workup I feel the patient has been appropriately medically screened and is safe for discharge home. Pertinent diagnoses were discussed with the patient. Patient was given return precautions. Rx / DC Orders ED Discharge Orders          Ordered    lidocaine (LIDODERM) 5 %  Every 24 hours        07/17/23 0458                [name] Clavel, MD 07/17/23 [phone]

## 2023-07-17 NOTE — ED Triage Notes (Signed)
Pt presents with low mid back pain, states fell on Sunday, pain gotten worse since. Per EMS pt has chronic back pain due to old fracture.

## 2023-07-30 ENCOUNTER — Emergency Department (HOSPITAL_BASED_OUTPATIENT_CLINIC_OR_DEPARTMENT_OTHER): Payer: Medicare Other

## 2023-07-30 ENCOUNTER — Ambulatory Visit (HOSPITAL_COMMUNITY)
Admission: RE | Admit: 2023-07-30 | Payer: Medicare Other | Source: Ambulatory Visit | Attending: Internal Medicine | Admitting: Internal Medicine

## 2023-07-30 ENCOUNTER — Other Ambulatory Visit: Payer: Self-pay

## 2023-07-30 ENCOUNTER — Encounter (HOSPITAL_BASED_OUTPATIENT_CLINIC_OR_DEPARTMENT_OTHER): Payer: Self-pay | Admitting: Emergency Medicine

## 2023-07-30 ENCOUNTER — Emergency Department (HOSPITAL_BASED_OUTPATIENT_CLINIC_OR_DEPARTMENT_OTHER)
Admission: EM | Admit: 2023-07-30 | Discharge: 2023-07-30 | Disposition: A | Discharge status: Home / Self Care | Payer: Medicare Other | Attending: Emergency Medicine | Admitting: Emergency Medicine

## 2023-07-30 DIAGNOSIS — J189 Pneumonia, unspecified organism: Secondary | ICD-10-CM | POA: Diagnosis not present

## 2023-07-30 DIAGNOSIS — R509 Fever, unspecified: Secondary | ICD-10-CM | POA: Diagnosis not present

## 2023-07-30 DIAGNOSIS — Z85118 Personal history of other malignant neoplasm of bronchus and lung: Secondary | ICD-10-CM | POA: Diagnosis not present

## 2023-07-30 DIAGNOSIS — J449 Chronic obstructive pulmonary disease, unspecified: Secondary | ICD-10-CM | POA: Diagnosis not present

## 2023-07-30 DIAGNOSIS — I10 Essential (primary) hypertension: Secondary | ICD-10-CM | POA: Insufficient documentation

## 2023-07-30 DIAGNOSIS — Z79899 Other long term (current) drug therapy: Secondary | ICD-10-CM | POA: Insufficient documentation

## 2023-07-30 DIAGNOSIS — Z7982 Long term (current) use of aspirin: Secondary | ICD-10-CM | POA: Diagnosis not present

## 2023-07-30 DIAGNOSIS — R051 Acute cough: Secondary | ICD-10-CM

## 2023-07-30 DIAGNOSIS — Z85828 Personal history of other malignant neoplasm of skin: Secondary | ICD-10-CM | POA: Insufficient documentation

## 2023-07-30 DIAGNOSIS — R0600 Dyspnea, unspecified: Secondary | ICD-10-CM | POA: Diagnosis present

## 2023-07-30 DIAGNOSIS — R000 Tachycardia, unspecified: Secondary | ICD-10-CM | POA: Insufficient documentation

## 2023-07-30 DIAGNOSIS — Z87891 Personal history of nicotine dependence: Secondary | ICD-10-CM | POA: Diagnosis not present

## 2023-07-30 DIAGNOSIS — Z20822 Contact with and (suspected) exposure to covid-19: Secondary | ICD-10-CM | POA: Insufficient documentation

## 2023-07-30 DIAGNOSIS — R059 Cough, unspecified: Secondary | ICD-10-CM | POA: Diagnosis not present

## 2023-07-30 LAB — TROPONIN I (HIGH SENSITIVITY): Troponin I (High Sensitivity): 4 ng/L (ref <18)

## 2023-07-30 LAB — COMPREHENSIVE METABOLIC PANEL WITH GFR
ALT: 15 U/L (ref 0–44)
AST: 22 U/L (ref 15–41)
Albumin: 3.8 g/dL (ref 3.5–5.0)
Alkaline Phosphatase: 79 U/L (ref 38–126)
Anion gap: 12 (ref 5–15)
BUN: 11 mg/dL (ref 8–23)
CO2: 22 mmol/L (ref 22–32)
Calcium: 9.1 mg/dL (ref 8.9–10.3)
Chloride: 102 mmol/L (ref 98–111)
Creatinine, Ser: 0.89 mg/dL (ref 0.44–1.00)
GFR, Estimated: >60 mL/min
Glucose, Bld: 124 mg/dL — ABNORMAL HIGH (ref 70–99)
Potassium: 3.6 mmol/L (ref 3.5–5.1)
Sodium: 136 mmol/L (ref 135–145)
Total Bilirubin: 0.4 mg/dL
Total Protein: 7.4 g/dL (ref 6.5–8.1)

## 2023-07-30 LAB — CBC WITH DIFFERENTIAL/PLATELET
Abs Immature Granulocytes: 0.07 10*3/uL (ref 0.00–0.07)
Basophils Absolute: 0.1 10*3/uL (ref 0.0–0.1)
Basophils Relative: 0 %
Eosinophils Absolute: 0.2 10*3/uL (ref 0.0–0.5)
Eosinophils Relative: 1 %
HCT: 40.6 % (ref 36.0–46.0)
Hemoglobin: 13.4 g/dL (ref 12.0–15.0)
Immature Granulocytes: 0 %
Lymphocytes Relative: 6 %
Lymphs Abs: 0.9 10*3/uL (ref 0.7–4.0)
MCH: 31.2 pg (ref 26.0–34.0)
MCHC: 33 g/dL (ref 30.0–36.0)
MCV: 94.6 fL (ref 80.0–100.0)
Monocytes Absolute: 0.8 10*3/uL (ref 0.1–1.0)
Monocytes Relative: 5 %
Neutro Abs: 13.9 10*3/uL — ABNORMAL HIGH (ref 1.7–7.7)
Neutrophils Relative %: 88 %
Platelets: 240 10*3/uL (ref 150–400)
RBC: 4.29 MIL/uL (ref 3.87–5.11)
RDW: 12.3 % (ref 11.5–15.5)
WBC: 15.9 10*3/uL — ABNORMAL HIGH (ref 4.0–10.5)
nRBC: 0 % (ref 0.0–0.2)

## 2023-07-30 LAB — RESP PANEL BY RT-PCR (RSV, FLU A&B, COVID)  RVPGX2
Influenza A by PCR: NEGATIVE
Influenza B by PCR: NEGATIVE
Resp Syncytial Virus by PCR: NEGATIVE
SARS Coronavirus 2 by RT PCR: NEGATIVE

## 2023-07-30 LAB — LIPASE, BLOOD: Lipase: 52 U/L — ABNORMAL HIGH (ref 11–51)

## 2023-07-30 LAB — BRAIN NATRIURETIC PEPTIDE: B Natriuretic Peptide: 25.5 pg/mL (ref 0.0–100.0)

## 2023-07-30 LAB — LACTIC ACID, PLASMA: Lactic Acid, Venous: 1.8 mmol/L (ref 0.5–1.9)

## 2023-07-30 MED ORDER — GUAIFENESIN-DM 100-10 MG/5ML PO SYRP: 5.0000 mL | ORAL_SOLUTION | ORAL | 0 refills | Status: DC | PRN

## 2023-07-30 MED ORDER — AZITHROMYCIN 250 MG PO TABS: 250.0000 mg | ORAL_TABLET | Freq: Every day | ORAL | 0 refills | Status: DC

## 2023-07-30 MED ORDER — SODIUM CHLORIDE 0.9 % IV BOLUS
1000.0000 mL | Freq: Once | INTRAVENOUS | Status: AC
  Administered 2023-07-30: 1000 mL via INTRAVENOUS

## 2023-07-30 MED ORDER — ACETAMINOPHEN 325 MG PO TABS
650.0000 mg | ORAL_TABLET | Freq: Once | ORAL | Status: AC | PRN
  Administered 2023-07-30: 650 mg via ORAL
  Filled 2023-07-30: qty 2

## 2023-07-30 NOTE — ED Provider Notes (Signed)
[place] [facility] AT [facility] Provider Note  CSN: [id number] Arrival date & time: 07/30/23 9518  Chief Complaint(s) Cough  HPI [name] [name] is a 72 y.o. female with past medical history as below, significant for chronic alcohol use, HLD, cancer in remission, HTN, GAD, arthritis who presents to the ED with complaint of cough, dyspnea  Patient reports cough, dyspnea since around 1 AM this morning.  Reports coughing episode, feels like she has mucus stuck in her chest/throat.  Feels short of breath while coughing.  When not coughing she is not having difficulty breathing.  No chest pain.  She had episode of emesis with coughing.  No fevers or chills, sick contacts or recent travel.  She is here with caretaker  Past Medical History Past Medical History:  Diagnosis Date   Anemia    duering cancer treatment    Anxiety    Arthritis    OA hands    Bilateral lower extremity pain    Blood transfusion without reported diagnosis    during chemo for lung cancer   Cataract    forming  but very small    Cholelithiasis    Colon polyps    adenomatous   Concussion 08/25/2021   ETOHdependence (HCC)    High cholesterol    History of chemotherapy    for lung cancer- completed 2013   Hx of radiation therapy 10/07/11 to 11/20/11   right lung   Hx of radiation therapy 01/07/2012- 01/21/12   cranial irradiation   Hypertension    Lung cancer (HCC) 09/19/2011   Stage III currently in remission   Malignant melanoma of skin of canthus of right eye (HCC) 02/2017   Osteoporosis 04/2014   Dexa scan by Dr. Rana Snare, T-score -2.7, next in 2 years, with at least 13 % major fracture in 10 years   Pitting edema    Bilateral lower extremities, duplex ultrasound 04/03/2015 normal, no DVT   Patient Active Problem List   Diagnosis Date Noted   Multiple closed fractures of ribs of left side 09/29/2022   Multiple closed fractures of proximal phalanx of finger 10/08/2021   EtOH  dependence (HCC) 09/11/2021   Vitamin B 12 deficiency 09/11/2021   Post-traumatic headache 09/11/2021   Insomnia 09/11/2021   Post concussion syndrome 08/30/2021   Benign essential HTN    Hyperglycemia    Urinary incontinence    Pelvic pain    Closed pelvic fracture (HCC) 08/03/2020   Closed fracture of left superior pubic ramus (HCC) 07/30/2020   Left hip pain 07/30/2020   GAD (generalized anxiety disorder) 07/30/2020   DJD (degenerative joint disease) 09/20/2019   Familial hyperlipidemia 07/01/2018   Right shoulder pain 07/14/2017   Malignant melanoma of skin of canthus of right eye (HCC) 03/16/2017   PCP NOTES >>>>>>>>>>>>>>> 05/22/2015   High coronary artery calcium score 02/14/2015   Hernia, abdominal 04/01/2013   Depression with anxiety 04/01/2012   Malignant neoplasm of upper lobe, bronchus or lung 09/30/2011   H/O: lung cancer 09/25/2011   Annual physical exam 09/05/2011   IBS -diarrhea predominant 09/05/2011   COPD (chronic obstructive pulmonary disease) (HCC) 09/05/2011   Osteoporosis 03/01/2010   TOBACCO ABUSE 11/14/2008   Dyslipidemia 05/18/2007   Essential hypertension 05/18/2007   Home Medication(s) Prior to Admission medications   Medication Sig Start Date End Date Taking? Authorizing Provider  azithromycin (ZITHROMAX) 250 MG tablet Take 1 tablet (250 mg total) by mouth daily. Take first 2 tablets together, then 1  every day until finished. 07/30/23  Yes Tanda Rockers A, DO  guaiFENesin-dextromethorphan (ROBITUSSIN DM) 100-10 MG/5ML syrup Take 5 mLs by mouth every 4 (four) hours as needed for cough. 07/30/23  Yes Tanda Rockers A, DO  acetaminophen (TYLENOL) 500 MG tablet Take 500 mg by mouth every 6 (six) hours as needed.    [provider]  aspirin EC 81 MG tablet Take 81 mg daily by mouth.    [provider]  carvedilol (COREG) 6.25 MG tablet TAKE 1 TABLET(6.25 MG) BY MOUTH TWICE DAILY WITH A MEAL 11/30/22   Worthy Rancher B, FNP  cimetidine (TAGAMET)  200 MG tablet Take 200 mg by mouth 2 (two) times daily.    [provider]  citalopram (CELEXA) 20 MG tablet Take 1 tablet (20 mg total) by mouth daily. 07/13/23   [name], MD  donepezil (ARICEPT) 5 MG tablet Take 1 tablet (5 mg total) by mouth at bedtime. 07/13/23   [name], MD  Evolocumab (REPATHA SURECLICK) 140 MG/ML SOAJ INJECT 140MG  UNDER THE SKIN ONCE EVERY 14 DAYS 01/07/23   Hilty, Lisette Abu, MD  lidocaine (LIDODERM) 5 % Place 1 patch onto the skin daily. Remove & Discard patch within 12 hours or as directed by MD 07/17/23   Nicanor Alcon, April, MD  predniSONE (DELTASONE) 20 MG tablet 3 tabs po day one, then 2 po daily x 4 days 07/17/23   Palumbo, April, MD  raloxifene (EVISTA) 60 MG tablet Take 1 tablet (60 mg total) by mouth daily. 04/03/20   Chrystie Nose, MD  rosuvastatin (CRESTOR) 40 MG tablet TAKE 1 TABLET(40 MG) BY MOUTH AT BEDTIME 02/02/23   Hilty, Lisette Abu, MD                                                                                                                                    Past Surgical History Past Surgical History:  Procedure Laterality Date   CHOLECYSTECTOMY  04/2011   biliary stent placement   CLOSED REDUCTION FINGER WITH PERCUTANEOUS PINNING Left 10/23/2021   Procedure: LEFT INDEX, MIDDLE, RING, AND SMALL FINGER CLOSED REDUCTION FINGER WITH PERCUTANEOUS PINNING;  Surgeon: Marlyne Beards, MD;  Location: [facility];  Service: Orthopedics;  Laterality: Left;   COLONOSCOPY     INCISIONAL HERNIA REPAIR  2015   IR KYPHO LUMBAR INC FX REDUCE BONE BX UNI/BIL CANNULATION INC/IMAGING  09/18/2022   MOHS SURGERY Right 02/2017   cheek and eyelid   Drue Stager Touch     Vaginal procedure   POLYPECTOMY     TUBAL LIGATION     Family History Family History  Problem Relation Age of Onset   Bladder Cancer Father    Stroke Father    Colon polyps Father    Atrial fibrillation Mother    Heart disease Mother        CHF   Clotting disorder  Mother  warfarin   Colon polyps Mother    Colon cancer Maternal Aunt 60   Drug abuse Son    Breast cancer Neg Hx    Esophageal cancer Neg Hx    Stomach cancer Neg Hx    Rectal cancer Neg Hx     Social History Social History   Tobacco Use   Smoking status: Former    Current packs/day: 0.00    Types: Cigarettes    Quit date: 09/08/2011    Years since quitting: 11.8   Smokeless tobacco: Never  Vaping Use   Vaping status: Never Used  Substance Use Topics   Alcohol use: Yes    Comment: daily bottle of wine   Drug use: No   Allergies Tramadol hcl, Codeine, and Penicillins  Review of Systems Review of Systems  Constitutional:  Negative for chills and fever.  HENT:  Positive for congestion and postnasal drip.   Respiratory:  Positive for cough. Negative for chest tightness.   Gastrointestinal:  Positive for vomiting. Negative for abdominal pain and nausea.  Genitourinary:  Negative for dysuria.  Musculoskeletal:  Positive for arthralgias.  Skin:  Negative for pallor and wound.  Neurological:  Negative for syncope and headaches.  All other systems reviewed and are negative.   Physical Exam Vital Signs  I have reviewed the triage vital signs BP 121/78   Pulse (!) 106   Temp 99.5 F (37.5 C)   Resp 18   Ht 5\' 7"  (1.702 m)   Wt 66 kg   SpO2 96%   BMI 22.79 kg/m  Physical Exam Vitals and nursing note reviewed.  Constitutional:      General: She is not in acute distress.    Appearance: Normal appearance.  HENT:     Head: Normocephalic and atraumatic.     Right Ear: External ear normal.     Left Ear: External ear normal.     Nose: Nose normal.     Mouth/Throat:     Mouth: Mucous membranes are moist.  Eyes:     General: No scleral icterus.       Right eye: No discharge.        Left eye: No discharge.  Cardiovascular:     Rate and Rhythm: Regular rhythm. Tachycardia present.     Pulses: Normal pulses.     Heart sounds: Normal heart sounds.  Pulmonary:      Effort: Pulmonary effort is normal. No respiratory distress.     Breath sounds: Normal breath sounds. No stridor.  Abdominal:     General: Abdomen is flat. There is no distension.     Palpations: Abdomen is soft.     Tenderness: There is no abdominal tenderness.  Musculoskeletal:     Cervical back: No rigidity.     Right lower leg: No edema.     Left lower leg: No edema.  Skin:    General: Skin is warm and dry.     Capillary Refill: Capillary refill takes less than 2 seconds.  Neurological:     Mental Status: She is alert.  Psychiatric:        Mood and Affect: Mood normal.        Behavior: Behavior normal. Behavior is cooperative.     ED Results and Treatments Labs (all labs ordered are listed, but only abnormal results are displayed) Labs Reviewed  CBC WITH DIFFERENTIAL/PLATELET - Abnormal; Notable for the following components:      Result Value   WBC 15.9 (*)  Neutro Abs 13.9 (*)    All other components within normal limits  COMPREHENSIVE METABOLIC PANEL - Abnormal; Notable for the following components:   Glucose, Bld 124 (*)    All other components within normal limits  LIPASE, BLOOD - Abnormal; Notable for the following components:   Lipase 52 (*)    All other components within normal limits  RESP PANEL BY RT-PCR (RSV, FLU A&B, COVID)  RVPGX2  BRAIN NATRIURETIC PEPTIDE  LACTIC ACID, PLASMA  LACTIC ACID, PLASMA  TROPONIN I (HIGH SENSITIVITY)  TROPONIN I (HIGH SENSITIVITY)                                                                                                                          Radiology DG Chest 2 View  Result Date: 07/30/2023 CLINICAL DATA:  Cough. EXAM: CHEST - 2 VIEW COMPARISON:  Chest CT 01/27/2023.  Chest x-ray 08/25/2021 FINDINGS: The lungs are clear without focal pneumonia, edema, pneumothorax or pleural effusion. Right suprahilar scarring is stable in the interval since the 2023 chest x-ray. The cardiopericardial silhouette is within  normal limits for size. No acute bony abnormality. Telemetry leads overlie the chest. IMPRESSION: No active cardiopulmonary disease. Electronically Signed   By: Kennith Center M.D.   On: 07/30/2023 05:50    Pertinent labs & imaging results that were available during my care of the patient were reviewed by me and considered in my medical decision making (see MDM for details).  Medications Ordered in ED Medications  acetaminophen (TYLENOL) tablet 650 mg (650 mg Oral Given 07/30/23 0347)  sodium chloride 0.9 % bolus 1,000 mL (1,000 mLs Intravenous New Bag/Given 07/30/23 0415)                                                                                                                                     Procedures Procedures  (including critical care time)  Medical Decision Making / ED Course    Medical Decision Making:    CHARNIQUE DITSWORTH is a 72 y.o. female with past medical history as below, significant for chronic alcohol use, HLD, cancer in remission, HTN, GAD, arthritis who presents to the ED with complaint of cough, dyspnea. The complaint involves an extensive differential diagnosis and also carries with it a high risk of complications and morbidity.  Serious etiology was considered. Ddx includes but is not limited to: Pneumonia, viral syndrome, bronchitis, URI,  CHF, metabolic derangement, etc  Complete initial physical exam performed, notably the patient was in NAD, sitting upright, reports she feels "all better.".    Reviewed and confirmed nursing documentation for past medical history, family history, social history.  Vital signs reviewed.    Patient with cough, posttussive emesis, tachycardia, dyspnea while coughing.  Will get screening labs, give IV fluids.  Clinical Course as of 07/30/23 0639  Thu Jul 30, 2023  0433 WBC(!): 15.9 [SG]  0433 Pulse Rate(!): 120 [SG]  0433 Temp: 99.5 F (37.5 C) No source of infection identified at this point [SG]  0550 Pt reports she  is "feeling great" and is requesting discharge  [SG]  0559 Pulse Rate(!): 106 Similar to prior, chronic elevation  [SG]  0616 CXR non acute, she is having productive cough, arthralgias, WBC+, will cover for atypical pna. No oxygen requirement, she is hemodynamically stable, overall is feeling much better, would favor trial of outpatient management and have her f/u with her PCP, pt is agreeable to this plan and is eager for discharge.  [SG]    Clinical Course User Index [SG] Sloan Leiter, DO    Brief summary: 72 year old female with history as above including chronic alcohol dependence and lung cancer in remission here with cough, posttussive emesis.  She is tachycardic chronically, improved after fluids.  She has leukocytosis.  X-ray stable.  She has no ongoing dyspnea, no nausea or vomiting.  Reports that she feels back to normal and is requesting discharge.  Patient with occasional productive cough, some arthralgias leukocytosis.  History of lung cancer.  She has no hypoxia, no fever.  Will cover for atypical pneumonia.  Recommend she follow-up with PCP in the next few days for recheck.  The patient improved significantly and was discharged in stable condition. Detailed discussions were had with the patient regarding current findings, and need for close f/u with PCP or on call doctor. The patient has been instructed to return immediately if the symptoms worsen in any way for re-evaluation. Patient verbalized understanding and is in agreement with current care plan. All questions answered prior to discharge.                  Additional history obtained: -Additional history obtained from caretaker at bedside -External records from outside source obtained and reviewed including: Chart review including previous notes, labs, imaging, consultation notes including  Home medications Prior ED visits Prior vitals   Lab Tests: -I ordered, reviewed, and interpreted labs.   The  pertinent results include:   Labs Reviewed  CBC WITH DIFFERENTIAL/PLATELET - Abnormal; Notable for the following components:      Result Value   WBC 15.9 (*)    Neutro Abs 13.9 (*)    All other components within normal limits  COMPREHENSIVE METABOLIC PANEL - Abnormal; Notable for the following components:   Glucose, Bld 124 (*)    All other components within normal limits  LIPASE, BLOOD - Abnormal; Notable for the following components:   Lipase 52 (*)    All other components within normal limits  RESP PANEL BY RT-PCR (RSV, FLU A&B, COVID)  RVPGX2  BRAIN NATRIURETIC PEPTIDE  LACTIC ACID, PLASMA  LACTIC ACID, PLASMA  TROPONIN I (HIGH SENSITIVITY)  TROPONIN I (HIGH SENSITIVITY)    Notable for as above, leukocytosis noted  EKG   EKG Interpretation Date/Time:  Thursday July 30 2023 04:07:07 EST Ventricular Rate:  111 PR Interval:  158 QRS Duration:  81 QT Interval:  363  QTC Calculation: 494 R Axis:   49  Text Interpretation: Sinus tachycardia Low voltage, precordial leads Borderline T wave abnormalities Borderline prolonged QT interval similar to prior Confirmed by Tanda Rockers ([id number]) on 07/30/2023 4:17:17 AM         Imaging Studies ordered: I ordered imaging studies including chest x-ray I independently visualized the following imaging with scope of interpretation limited to determining acute life threatening conditions related to emergency care; findings noted above I independently visualized and interpreted imaging. I agree with the radiologist interpretation   Medicines ordered and prescription drug management: Meds ordered this encounter  Medications   acetaminophen (TYLENOL) tablet 650 mg   sodium chloride 0.9 % bolus 1,000 mL   azithromycin (ZITHROMAX) 250 MG tablet    Sig: Take 1 tablet (250 mg total) by mouth daily. Take first 2 tablets together, then 1 every day until finished.    Dispense:  6 tablet    Refill:  0   guaiFENesin-dextromethorphan  (ROBITUSSIN DM) 100-10 MG/5ML syrup    Sig: Take 5 mLs by mouth every 4 (four) hours as needed for cough.    Dispense:  118 mL    Refill:  0    -I have reviewed the patients home medicines and have made adjustments as needed   Consultations Obtained: na   Cardiac Monitoring: The patient was maintained on a cardiac monitor.  I personally viewed and interpreted the cardiac monitored which showed an underlying rhythm of: sinus tachy > improved Continuous pulse oximetry interpreted by myself, 98% on RA.    Social Determinants of Health:  Diagnosis or treatment significantly limited by social determinants of health: former smoker and alcohol use Recommend she cut back on her alcohol use   Reevaluation: After the interventions noted above, I reevaluated the patient and found that they have resolved  Co morbidities that complicate the patient evaluation  Past Medical History:  Diagnosis Date   Anemia    duering cancer treatment    Anxiety    Arthritis    OA hands    Bilateral lower extremity pain    Blood transfusion without reported diagnosis    during chemo for lung cancer   Cataract    forming  but very small    Cholelithiasis    Colon polyps    adenomatous   Concussion 08/25/2021   ETOHdependence (HCC)    High cholesterol    History of chemotherapy    for lung cancer- completed 2013   Hx of radiation therapy 10/07/11 to 11/20/11   right lung   Hx of radiation therapy 01/07/2012- 01/21/12   cranial irradiation   Hypertension    Lung cancer (HCC) 09/19/2011   Stage III currently in remission   Malignant melanoma of skin of canthus of right eye (HCC) 02/2017   Osteoporosis 04/2014   Dexa scan by Dr. Rana Snare, T-score -2.7, next in 2 years, with at least 13 % major fracture in 10 years   Pitting edema    Bilateral lower extremities, duplex ultrasound 04/03/2015 normal, no DVT      Dispostion: Disposition decision including need for hospitalization was considered, and  patient discharged from emergency department.    Final Clinical Impression(s) / ED Diagnoses Final diagnoses:  Acute cough  Atypical pneumonia  Tachycardia        Sloan Leiter, DO 07/30/23 [phone]

## 2023-07-30 NOTE — Discharge Instructions (Addendum)
Your symptoms may be secondary to an atypical pneumonia.  You are prescribed antibiotic to treat this.  Consider using a humidifier in your bedroom.  Drink plenty of fluids over the next few days.  Follow-up with your PCP for recheck in the next few days.  Return for any worsening or worrisome symptoms

## 2023-07-30 NOTE — ED Triage Notes (Signed)
Pt arrives for cough and "breathings bad" was sick about 0100 with emesis, states difficulty breathing when laying down.

## 2023-07-31 ENCOUNTER — Telehealth: Payer: Self-pay

## 2023-07-31 NOTE — Transitions of Care (Post Inpatient/ED Visit) (Unsigned)
   07/31/2023  Name: [name] [name] MRN: [id number] DOB: 07-30-1951  Today's TOC FU Call Status: Today's TOC FU Call Status:: Unsuccessful Call (1st Attempt) Unsuccessful Call (1st Attempt) Date: 07/31/23  Attempted to reach the patient regarding the most recent Inpatient/ED visit.  Follow Up Plan: Additional outreach attempts will be made to reach the patient to complete the Transitions of Care (Post Inpatient/ED visit) call.   Signature Karena Addison, LPN [facility] Nurse Health Advisor Direct Dial [phone]

## 2023-08-03 ENCOUNTER — Telehealth: Payer: Self-pay | Admitting: *Deleted

## 2023-08-03 NOTE — Transitions of Care (Post Inpatient/ED Visit) (Signed)
   08/03/2023  Name: [name] [name] MRN: [id number] DOB: [birth year]  Today's TOC FU Call Status: Today's TOC FU Call Status:: Unsuccessful Call (2nd Attempt) Unsuccessful Call (2nd Attempt) Date: 08/03/23  Attempted to reach the patient regarding the most recent Inpatient/ED visit.  Follow Up Plan: Additional outreach attempts will be made to reach the patient to complete the Transitions of Care (Post Inpatient/ED visit) call.   Signature Makyle Eslick, [facility]

## 2023-08-04 ENCOUNTER — Ambulatory Visit: Payer: Medicare Other | Admitting: Internal Medicine

## 2023-08-04 NOTE — Transitions of Care (Post Inpatient/ED Visit) (Signed)
08/04/2023  Name: [name] [name] MRN: [id number] DOB: 09/01/1950  Today's TOC FU Call Status: Today's TOC FU Call Status:: Successful TOC FU Call Completed Unsuccessful Call (1st Attempt) Date: 07/31/23 [facility] FU Call Complete Date: 08/04/23 Patient's Name and Date of Birth confirmed.  Transition Care Management Follow-up Telephone Call Date of Discharge: 07/30/23 Discharge Facility: [facility] Type of Discharge: Emergency Department How have you been since you were released from the hospital?: Better Any questions or concerns?: No  Items Reviewed: Did you receive and understand the discharge instructions provided?: Yes Medications obtained,verified, and reconciled?: Yes (Medications Reviewed) Any new allergies since your discharge?: No Dietary orders reviewed?: Yes Do you have support at home?: Yes People in Home: child(ren), adult  Medications Reviewed Today: Medications Reviewed Today     Reviewed by Karena Addison, LPN (Licensed Practical Nurse) on 08/04/23 at 1515  Med List Status: <None>   Medication Order Taking? Sig Documenting Provider Last Dose Status Informant  acetaminophen (TYLENOL) 500 MG tablet [id number] No Take 500 mg by mouth every 6 (six) hours as needed. [provider] Taking Active   aspirin EC 81 MG tablet [id number] No Take 81 mg daily by mouth. [provider] Taking Active Child  azithromycin (ZITHROMAX) 250 MG tablet [id number]  Take 1 tablet (250 mg total) by mouth daily. Take first 2 tablets together, then 1 every day until finished. Tanda Rockers A, DO  Active   carvedilol (COREG) 6.25 MG tablet [id number] No TAKE 1 TABLET(6.25 MG) BY MOUTH TWICE DAILY WITH A MEAL Worthy Rancher B, FNP Taking Active   cimetidine (TAGAMET) 200 MG tablet [id number] No Take 200 mg by mouth 2 (two) times daily. [provider] Taking Active   citalopram (CELEXA) 20 MG tablet [id number]  Take 1 tablet (20 mg total) by mouth daily.  [name], MD  Active   donepezil (ARICEPT) 5 MG tablet [id number]  Take 1 tablet (5 mg total) by mouth at bedtime. [name], MD  Active   Evolocumab [facility] SURECLICK) 140 MG/ML Ivory Broad [id number] No INJECT 140MG  UNDER THE SKIN ONCE EVERY 14 DAYS Hilty, Lisette Abu, MD Taking Active   guaiFENesin-dextromethorphan [facility] DM) 100-10 MG/5ML syrup [id number]  Take 5 mLs by mouth every 4 (four) hours as needed for cough. Tanda Rockers A, DO  Active   lidocaine (LIDODERM) 5 % [id number]  Place 1 patch onto the skin daily. Remove & Discard patch within 12 hours or as directed by MD Nicanor Alcon, April, MD  Active   predniSONE (DELTASONE) 20 MG tablet [id number]  3 tabs po day one, then 2 po daily x 4 days Palumbo, April, MD  Active   raloxifene (EVISTA) 60 MG tablet [id number] No Take 1 tablet (60 mg total) by mouth daily. Chrystie Nose, MD Taking Active Child  rosuvastatin (CRESTOR) 40 MG tablet [id number] No TAKE 1 TABLET(40 MG) BY MOUTH AT BEDTIME Hilty, Lisette Abu, MD Taking Active             Home Care and Equipment/Supplies: Were Home Health Services Ordered?: NA Any new equipment or medical supplies ordered?: NA  Functional Questionnaire: Do you need assistance with bathing/showering or dressing?: No Do you need assistance with meal preparation?: No Do you need assistance with eating?: No Do you have difficulty maintaining continence: No Do you need assistance with getting out of bed/getting out of a chair/moving?: No Do you have difficulty managing or taking your medications?: No  Follow up appointments reviewed: PCP Follow-up appointment  confirmed?: Yes Date of PCP follow-up appointment?: 08/05/23 Follow-up Provider: [facility] Follow-up appointment confirmed?: NA Do you need transportation to your follow-up appointment?: No Do you understand care options if your condition(s) worsen?: Yes-patient verbalized understanding    SIGNATURE Karena Addison, LPN [facility] Nurse  Health Advisor Direct Dial [phone]

## 2023-08-05 ENCOUNTER — Ambulatory Visit: Payer: Medicare Other | Admitting: Internal Medicine

## 2023-08-28 ENCOUNTER — Ambulatory Visit (HOSPITAL_COMMUNITY): Admission: RE | Admit: 2023-08-28 | Payer: Medicare Other | Source: Ambulatory Visit

## 2023-09-11 ENCOUNTER — Telehealth: Payer: Self-pay | Admitting: Internal Medicine

## 2023-09-11 NOTE — Telephone Encounter (Signed)
Pt called in stating she had to pay $500 out of pocket for Rep[place]. She states in the past she had a coupon code and she asked if she can get that again. Please advise.

## 2023-09-14 NOTE — Telephone Encounter (Signed)
Card No. [id number] Card Status Active BIN [id number] PCN [id number] [employer] [facility] [id number]  Card info given to care taker Instructions for reimbursement also given

## 2023-10-19 DIAGNOSIS — Z01419 Encounter for gynecological examination (general) (routine) without abnormal findings: Secondary | ICD-10-CM | POA: Diagnosis not present

## 2023-10-19 DIAGNOSIS — Z6827 Body mass index (BMI) 27.0-27.9, adult: Secondary | ICD-10-CM | POA: Diagnosis not present

## 2023-10-19 DIAGNOSIS — M810 Age-related osteoporosis without current pathological fracture: Secondary | ICD-10-CM | POA: Diagnosis not present

## 2024-01-06 ENCOUNTER — Other Ambulatory Visit: Payer: Self-pay | Admitting: Pharmacist

## 2024-01-06 MED ORDER — REPATHA SURECLICK 140 MG/ML ~~LOC~~ SOAJ: SUBCUTANEOUS | 11 refills | Status: DC

## 2024-01-15 ENCOUNTER — Other Ambulatory Visit: Payer: Self-pay | Admitting: Internal Medicine

## 2024-01-21 ENCOUNTER — Inpatient Hospital Stay: Attending: Internal Medicine

## 2024-01-21 ENCOUNTER — Ambulatory Visit (HOSPITAL_COMMUNITY)
Admission: RE | Admit: 2024-01-21 | Discharge: 2024-01-21 | Disposition: A | Discharge status: Home / Self Care | Source: Ambulatory Visit | Attending: Internal Medicine | Admitting: Internal Medicine

## 2024-01-21 DIAGNOSIS — Z7982 Long term (current) use of aspirin: Secondary | ICD-10-CM | POA: Insufficient documentation

## 2024-01-21 DIAGNOSIS — I10 Essential (primary) hypertension: Secondary | ICD-10-CM | POA: Diagnosis not present

## 2024-01-21 DIAGNOSIS — Z85118 Personal history of other malignant neoplasm of bronchus and lung: Secondary | ICD-10-CM | POA: Diagnosis not present

## 2024-01-21 DIAGNOSIS — Z79899 Other long term (current) drug therapy: Secondary | ICD-10-CM | POA: Insufficient documentation

## 2024-01-21 DIAGNOSIS — Z923 Personal history of irradiation: Secondary | ICD-10-CM | POA: Diagnosis not present

## 2024-01-21 DIAGNOSIS — E7800 Pure hypercholesterolemia, unspecified: Secondary | ICD-10-CM | POA: Insufficient documentation

## 2024-01-21 DIAGNOSIS — Z85820 Personal history of malignant melanoma of skin: Secondary | ICD-10-CM | POA: Insufficient documentation

## 2024-01-21 DIAGNOSIS — C3491 Malignant neoplasm of unspecified part of right bronchus or lung: Secondary | ICD-10-CM | POA: Diagnosis not present

## 2024-01-21 DIAGNOSIS — C3490 Malignant neoplasm of unspecified part of unspecified bronchus or lung: Secondary | ICD-10-CM | POA: Insufficient documentation

## 2024-01-21 DIAGNOSIS — Z9221 Personal history of antineoplastic chemotherapy: Secondary | ICD-10-CM | POA: Insufficient documentation

## 2024-01-21 DIAGNOSIS — C3492 Malignant neoplasm of unspecified part of left bronchus or lung: Secondary | ICD-10-CM | POA: Diagnosis not present

## 2024-01-21 DIAGNOSIS — R42 Dizziness and giddiness: Secondary | ICD-10-CM | POA: Diagnosis not present

## 2024-01-21 LAB — CMP (CANCER CENTER ONLY)
ALT: 23 U/L (ref 0–44)
AST: 30 U/L (ref 15–41)
Albumin: 4.3 g/dL (ref 3.5–5.0)
Alkaline Phosphatase: 59 U/L (ref 38–126)
Anion gap: 9 (ref 5–15)
BUN: 13 mg/dL (ref 8–23)
CO2: 26 mmol/L (ref 22–32)
Calcium: 9.8 mg/dL (ref 8.9–10.3)
Chloride: 102 mmol/L (ref 98–111)
Creatinine: 1.3 mg/dL — ABNORMAL HIGH (ref 0.44–1.00)
GFR, Estimated: 44 mL/min — ABNORMAL LOW (ref >60)
Glucose, Bld: 84 mg/dL (ref 70–99)
Potassium: 3.8 mmol/L (ref 3.5–5.1)
Sodium: 137 mmol/L (ref 135–145)
Total Bilirubin: 0.5 mg/dL (ref 0.0–1.2)
Total Protein: 7.1 g/dL (ref 6.5–8.1)

## 2024-01-21 LAB — CBC WITH DIFFERENTIAL (CANCER CENTER ONLY)
Abs Immature Granulocytes: 0.03 10*3/uL (ref 0.00–0.07)
Basophils Absolute: 0.1 10*3/uL (ref 0.0–0.1)
Basophils Relative: 1 %
Eosinophils Absolute: 0.1 10*3/uL (ref 0.0–0.5)
Eosinophils Relative: 2 %
HCT: 39.3 % (ref 36.0–46.0)
Hemoglobin: 13 g/dL (ref 12.0–15.0)
Immature Granulocytes: 0 %
Lymphocytes Relative: 10 %
Lymphs Abs: 0.9 10*3/uL (ref 0.7–4.0)
MCH: 31.2 pg (ref 26.0–34.0)
MCHC: 33.1 g/dL (ref 30.0–36.0)
MCV: 94.2 fL (ref 80.0–100.0)
Monocytes Absolute: 0.6 10*3/uL (ref 0.1–1.0)
Monocytes Relative: 7 %
Neutro Abs: 7.1 10*3/uL (ref 1.7–7.7)
Neutrophils Relative %: 80 %
Platelet Count: 187 10*3/uL (ref 150–400)
RBC: 4.17 MIL/uL (ref 3.87–5.11)
RDW: 12.1 % (ref 11.5–15.5)
WBC Count: 8.8 10*3/uL (ref 4.0–10.5)
nRBC: 0 % (ref 0.0–0.2)

## 2024-01-21 MED ORDER — IOHEXOL 300 MG/ML  SOLN
75.0000 mL | Freq: Once | INTRAMUSCULAR | Status: AC | PRN
  Administered 2024-01-21: 75 mL via INTRAVENOUS

## 2024-01-21 MED ORDER — SODIUM CHLORIDE (PF) 0.9 % IJ SOLN
INTRAMUSCULAR | Status: AC
  Filled 2024-01-21: qty 50

## 2024-01-23 ENCOUNTER — Other Ambulatory Visit: Payer: Self-pay | Admitting: Internal Medicine

## 2024-01-25 ENCOUNTER — Other Ambulatory Visit: Payer: Medicare Other

## 2024-01-25 ENCOUNTER — Ambulatory Visit: Payer: Self-pay

## 2024-01-25 ENCOUNTER — Telehealth: Payer: Self-pay | Admitting: Internal Medicine

## 2024-01-25 NOTE — Telephone Encounter (Signed)
 Copied from CRM [id number]. Topic: Clinical - Medication Refill >> Jan 25, 2024 12:42 PM Corin V wrote: Medication: citalopram  (CELEXA ) 20 MG tablet  donepezil  (ARICEPT ) 5 MG table  Has the patient contacted their pharmacy? Yes (Agent: If no, request that the patient contact the pharmacy for the refill. If patient does not wish to contact the pharmacy document the reason why and proceed with request.) (Agent: If yes, when and what did the pharmacy advise?)  This is the patient's preferred pharmacy:  [id number] - [place], [place] - [address] AT [facility] [address] & [address] [place] [address] Phone: [phone] Fax: [phone]  Is this the correct pharmacy for this prescription? Yes If no, delete pharmacy and type the correct one.   Has the prescription been filled recently? No  Is the patient out of the medication? Yes  Has the patient been seen for an appointment in the last year OR does the patient have an upcoming appointment? Yes  Can we respond through MyChart? No  Agent: Please be advised that Rx refills may take up to 3 business days. We ask that you follow-up with your pharmacy.

## 2024-01-25 NOTE — Telephone Encounter (Signed)
 [name] [name], will you contact Pt's sister- needs an appt please.

## 2024-01-25 NOTE — Telephone Encounter (Signed)
 Spoke to pts sister [name] [name]. [name] [name] is requesting:  -Refill on Celexa . Pharmacy noted on file is preferred [facility]).  - Patient has heartburn, per [name] Kern MD Neomi Banks already aware. Pt is taking Nexium, relief is noted. [name] [name] is concerned regarding long term use of Nexium. Requesting if MD Neomi Banks would recommend long term use or want to prescribe another PPI med.   [name] [name] spoke to a family friend who recommended taking benfotimine (vitamin B1 supplement) and an Alpha-GPC medication in replacement of Donepezil . Donepezil  worsens pts diarrhea. So,  [name] [name] is requesting feedback on that recommendation.     [name] [name] was offered a Tax inspector with MD Neomi Banks. [name] [name] declined, as she noted the patient would be uncomfortable doing so.  Message routed to office appropriately.   -------------------------- Copied from CRM [id number]. Topic: Clinical - Medical Advice >> Jan 25, 2024 12:45 PM Corin V wrote: Reason for CRM: Patient's sister, [name] [name], stated that the patient's neurologist suggested patient taking benfotimine (vitamin B1 supplement) and an alpha GPC medication as an alternative to her donepezil  due to the number of side effects of the donepezil .   She is also having frequent heartburn and she has been taking nexium. The Nexium says to not take for over 14 days and [name] [name] is worried about long term effects of this. She wants to know if protonix or another medication can be called in without an appointment due to how hard it is for her to get out of the house with mobility issues. Please call [name] [name] back after 2pm with questions at [phone]

## 2024-01-26 ENCOUNTER — Telehealth: Payer: Self-pay | Admitting: Internal Medicine

## 2024-01-26 MED ORDER — CITALOPRAM HYDROBROMIDE 20 MG PO TABS: 20.0000 mg | ORAL_TABLET | Freq: Every day | ORAL | 0 refills | Status: DC

## 2024-01-26 NOTE — Addendum Note (Signed)
 Addended by: Val Farnam D on: 01/26/2024 04:31 PM   Modules accepted: Orders

## 2024-01-26 NOTE — Telephone Encounter (Signed)
 Copied from CRM [id number]. Topic: General - Other >> Jan 26, 2024  3:54 PM Adrionna Y wrote: Reason for CRM: Genevia Kern the patients sister calling in because medications were denied. advised needs an appointment  Patient doesn't want to come in and does not want a virtual appointment  Genevia Kern is wondering what else they can do

## 2024-01-26 NOTE — Telephone Encounter (Signed)
 Pt's sister Genevia Kern was never called back, see telephone note from today.

## 2024-01-26 NOTE — Telephone Encounter (Signed)
 So sorry I missed your note. She stated that the Nexium is helping and she will see you on thew 10th.

## 2024-01-26 NOTE — Telephone Encounter (Signed)
 Spoke w/ [name] [name]- Pt's sister, POA, (she called yesterday- see nurse triage note), informed that Pt needs an appt to discuss these issues, [name] [name] originally states Pt has transportation and mobility concerns and unable to come to visit, and Pt refuses to do a telephone/virtual visit. I pointed out that we can see that Pt has been seeing cardiology, GYN and hematology as requested. [name] [name] then stated Pt can't/won't come in- due to fear of what Dr. Neomi Banks will tell her during this visit, originally [name] [name] said maybe there is a personality dispute between Pt and PCP, I recommended that if she feels this way she may need to consider switching PCPs so that Pt can get the care she needs. [name] [name] then states maybe that was the wrong thing to say for her sister, we finally agreed to schedule an appt for next Tuesday, and I have given Pt a 7 day supply of citalopram . [name] [name] will try to come to visit with Pt but likely Pt will not let her. [name] [name] requests a call from PCP next week after visit if she is unable to come w/ her.

## 2024-01-27 ENCOUNTER — Inpatient Hospital Stay: Payer: Medicare Other | Attending: Internal Medicine | Admitting: Internal Medicine

## 2024-01-27 DIAGNOSIS — Z85118 Personal history of other malignant neoplasm of bronchus and lung: Secondary | ICD-10-CM

## 2024-01-27 NOTE — Progress Notes (Signed)
 [facility] [phone]   [phone]  PROGRESS NOTE FOR TELEMEDICINE VISITS  [name] Hollow, MD [address] [place] [address]  I connected withNAME@ on 01/27/24 at  3:30 PM EDT by telephone visit and verified that I am speaking with the correct person using two identifiers.   I discussed the limitations, risks, security and privacy concerns of performing an evaluation and management service by telemedicine and the availability of in-person appointments. I also discussed with the patient that there may be a patient responsible charge related to this service. The patient expressed understanding and agreed to proceed.  Other persons participating in the visit and their role in the encounter: [name] [name]  Patient's location: Home Provider's location:  cancer Center  DIAGNOSIS: Limited stage small cell lung cancer diagnosed in January of 2013    PRIOR THERAPY: 1) Systemic chemotherapy with cisplatin  at 60 mg per meter square given on day 1 and etoposide  at 120 mg per meter square given on days one 2 and 3 with Neulasta  support given on day 4 status post 4 cycles, last cycle was given on 12/02/2011 . This with concurrent with radiotherapy under the care of Dr. Jeryl Moris.  2) prophylactic cranial irradiation under the care of Dr. Jeryl Moris completed on 01/21/2012    CURRENT THERAPY: Observation  INTERVAL HISTORY: [name] [name] 73 y.o. female has a telephone virtual visit with me today for evaluation and discussion of her recent scan results.Discussed the use of AI scribe software for clinical note transcription with the patient, who gave verbal consent to proceed.  History of Present Illness   [name] [name] is a 73 year old female with limited stage small cell lung cancer who presents for evaluation and discussion of recent scan results.  She has a history of limited stage small cell lung cancer diagnosed in  January 2013, treated with systemic chemotherapy concurrent with radiation, followed by prophylactic cranial irradiation, completed in May 2013. She has been under observation since then.  Recent chest scans show no evidence of recurrent or metastatic disease, only radiation changes in the right lung.  She feels 'terrible' and is homebound, unable to walk or drive, and experiences general weakness at certain times. She states, 'I can't walk, can't drive, can't do anything,' indicating a significant impact on her daily life.  No chest pain, breathing issues, nausea, vomiting, or diarrhea. She acknowledges having osteoporosis.  She is homebound and relies on full-time care at her house. Her [name], [name] [name], is her full-time medical power of attorney and caregiver.       MEDICAL HISTORY: Past Medical History:  Diagnosis Date   Anemia    duering cancer treatment    Anxiety    Arthritis    OA hands    Bilateral lower extremity pain    Blood transfusion without reported diagnosis    during chemo for lung cancer   Cataract    forming  but very small    Cholelithiasis    Colon polyps    adenomatous   Concussion 08/25/2021   ETOHdependence (HCC)    High cholesterol    History of chemotherapy    for lung cancer- completed 2013   Hx of radiation therapy 10/07/11 to 11/20/11   right lung   Hx of radiation therapy 01/07/2012- 01/21/12   cranial irradiation   Hypertension    Lung cancer (HCC) 09/19/2011   Stage III currently in remission   Malignant melanoma  of skin of canthus of right eye (HCC) 02/2017   Osteoporosis 04/2014   Dexa scan by Dr. Asencion Blacksmith, T-score -2.7, next in 2 years, with at least 13 % major fracture in 10 years   Pitting edema    Bilateral lower extremities, duplex ultrasound 04/03/2015 normal, no DVT    ALLERGIES:  is allergic to tramadol  hcl, codeine, and penicillins.  MEDICATIONS:  Current Outpatient Medications  Medication Sig Dispense Refill   acetaminophen   (TYLENOL ) 500 MG tablet Take 500 mg by mouth every 6 (six) hours as needed.     aspirin  EC 81 MG tablet Take 81 mg daily by mouth.     azithromycin  (ZITHROMAX ) 250 MG tablet Take 1 tablet (250 mg total) by mouth daily. Take first 2 tablets together, then 1 every day until finished. 6 tablet 0   carvedilol  (COREG ) 6.25 MG tablet TAKE 1 TABLET(6.25 MG) BY MOUTH TWICE DAILY WITH A MEAL 180 tablet 1   cimetidine (TAGAMET) 200 MG tablet Take 200 mg by mouth 2 (two) times daily.     citalopram  (CELEXA ) 20 MG tablet Take 1 tablet (20 mg total) by mouth daily. 7 tablet 0   donepezil  (ARICEPT ) 5 MG tablet Take 1 tablet (5 mg total) by mouth at bedtime. 90 tablet 1   Evolocumab  (REPATHA  SURECLICK) 140 MG/ML SOAJ INJECT 140MG  UNDER THE SKIN ONCE EVERY 14 DAYS 2 mL 11   guaiFENesin -dextromethorphan (ROBITUSSIN DM) 100-10 MG/5ML syrup Take 5 mLs by mouth every 4 (four) hours as needed for cough. 118 mL 0   lidocaine  (LIDODERM ) 5 % Place 1 patch onto the skin daily. Remove & Discard patch within 12 hours or as directed by MD 30 patch 0   predniSONE  (DELTASONE ) 20 MG tablet 3 tabs po day one, then 2 po daily x 4 days 11 tablet 0   raloxifene  (EVISTA ) 60 MG tablet Take 1 tablet (60 mg total) by mouth daily. 90 tablet 3   rosuvastatin  (CRESTOR ) 40 MG tablet TAKE 1 TABLET(40 MG) BY MOUTH AT BEDTIME 90 tablet 3   No current facility-administered medications for this visit.    SURGICAL HISTORY:  Past Surgical History:  Procedure Laterality Date   CHOLECYSTECTOMY  04/2011   biliary stent placement   CLOSED REDUCTION FINGER WITH PERCUTANEOUS PINNING Left 10/23/2021   Procedure: LEFT INDEX, MIDDLE, RING, AND SMALL FINGER CLOSED REDUCTION FINGER WITH PERCUTANEOUS PINNING;  Surgeon: Marilyn Shropshire, MD;  Location: [facility];  Service: Orthopedics;  Laterality: Left;   COLONOSCOPY     INCISIONAL HERNIA REPAIR  2015   IR KYPHO LUMBAR INC FX REDUCE BONE BX UNI/BIL CANNULATION INC/IMAGING  09/18/2022    MOHS SURGERY Right 02/2017   cheek and eyelid   T[place]i Fat Touch     Vaginal procedure   POLYPECTOMY     TUBAL LIGATION      REVIEW OF SYSTEMS:  A comprehensive review of systems was negative except for: Constitutional: positive for fatigue Musculoskeletal: positive for arthralgias and muscle weakness    LABORATORY DATA: Lab Results  Component Value Date   WBC 8.8 01/21/2024   HGB 13.0 01/21/2024   HCT 39.3 01/21/2024   MCV 94.2 01/21/2024   PLT 187 01/21/2024      Chemistry      Component Value Date/Time   NA 137 01/21/2024 1332   NA 143 02/05/2017 0817   K 3.8 01/21/2024 1332   K 4.3 02/05/2017 0817   CL 102 01/21/2024 1332   CL 104 12/23/2012 1338  CO2 26 01/21/2024 1332   CO2 25 02/05/2017 0817   BUN 13 01/21/2024 1332   BUN 16.0 02/05/2017 0817   CREATININE 1.30 (H) 01/21/2024 1332   CREATININE 1.0 02/05/2017 0817      Component Value Date/Time   CALCIUM  9.8 01/21/2024 1332   CALCIUM  9.5 02/05/2017 0817   ALKPHOS 59 01/21/2024 1332   ALKPHOS 48 02/05/2017 0817   AST 30 01/21/2024 1332   AST 21 02/05/2017 0817   ALT 23 01/21/2024 1332   ALT 12 02/05/2017 0817   BILITOT 0.5 01/21/2024 1332   BILITOT 0.31 02/05/2017 0817       RADIOGRAPHIC STUDIES: CT Chest W Contrast Result Date: 01/26/2024 EXAM: CT CHEST WITH CONTRAST 01/21/2024 03:13:23 PM TECHNIQUE: CT of the chest was performed with the administration of intravenous contrast (75mL iohexol  (OMNIPAQUE ) 300 MG/ML solution). Multiplanar reformatted images are provided for review. Automated exposure control, iterative reconstruction, and/or weight based adjustment of the mA/kV was utilized to reduce the radiation dose to as low as reasonably achievable. COMPARISON: 01/27/2023 CLINICAL HISTORY: Small cell lung cancer (SCLC), staging. Malignant neoplasm of bronchus or lung; Small cell lung cancer, staging; Omnipaque  300; 100 cc ; Bilateral lung cancer; Dx'd 5 years ago; Chemo, XRT complete; No complaints  FINDINGS: MEDIASTINUM: Heart and pericardium are unremarkable. The central airways are clear. Moderate 3-vessel coronary atherosclerosis. LYMPH NODES: No mediastinal, hilar or axillary lymphadenopathy. LUNGS AND PLEURA: No focal consolidation or pulmonary edema. No pleural effusion or pneumothorax. Radiation changes in the medial right hemithorax. SOFT TISSUES/BONES: No acute abnormality of the soft tissues. Status post vertebral augmentation at L2. Mild-to-moderate inferior endplate compression fracture deformity at T8 (sagittal image 98), new. Moderate anterior compression fracture deformity at T10, chronic. UPPER ABDOMEN: Limited images of the upper abdomen demonstrates no acute abnormality. Status post cholecystectomy. IMPRESSION: 1. No evidence of recurrent or metastatic disease. Radiation changes in the medial right hemithorax. 2. New mild-to-moderate inferior endplate compression fracture deformity at T8. Electronically signed by: Zadie Herter MD 01/26/2024 10:29 PM EDT RP Workstation: [id number]    ASSESSMENT AND PLAN:  This is a very pleasant 73 years old white female with limited stage small cell lung cancer status post systemic chemotherapy with cisplatin  and etoposide  concurrent with radiation and followed by prophylactic cranial irradiation completed in May 2013. She has been on observation since that time and the patient is feeling fine today with no concerning complaints. She had repeat CT scan of the chest performed recently.  I personally and independently reviewed the scan and discussed the results with the patient and her [name] today.  Her scan showed no concerning findings for disease recurrence or metastasis.     Limited stage small cell lung cancer Diagnosed in January 2013, she completed systemic chemotherapy, concurrent radiation, and prophylactic cranial irradiation by May 2013. Recent chest scans show no evidence of recurrent or metastatic disease, only radiation changes in  the right lung. Her condition has been stable for over 12 years, and continued oncology follow-up is unnecessary. - Discontinue oncology follow-up due to absence of recurrent disease. - Advise follow-up with primary care physician for ongoing health management.  Osteoporosis Osteoporosis may have contributed to the compression of the T8 vertebra seen on recent imaging. This could be due to osteoporosis or a fall. She reports being homebound and experiencing intermittent general weakness. - Continue osteoporosis management with primary care physician.   She was advised to call immediately if she has any other concerning symptoms in the interval.  I  discussed the assessment and treatment plan with the patient. The patient was provided an opportunity to ask questions and all were answered. The patient agreed with the plan and demonstrated an understanding of the instructions.   The patient was advised to call back or seek an in-person evaluation if the symptoms worsen or if the condition fails to improve as anticipated.  I provided 20 minutes of non face-to-face telephone visit time during this encounter, and > 50% was spent counseling as documented under my assessment & plan.  [name] Blower, MD 01/27/2024 3:43 PM  Disclaimer: This note was dictated with voice recognition software. Similar sounding words can inadvertently be transcribed and may not be corrected upon review.

## 2024-02-01 ENCOUNTER — Encounter: Payer: Self-pay | Admitting: Internal Medicine

## 2024-02-02 ENCOUNTER — Inpatient Hospital Stay (HOSPITAL_COMMUNITY)

## 2024-02-02 ENCOUNTER — Inpatient Hospital Stay (HOSPITAL_COMMUNITY)
Admission: EM | Admit: 2024-02-02 | Discharge: 2024-02-12 | DRG: 481 | Disposition: A | Discharge status: Skilled Nursing Facility | Attending: Family Medicine | Admitting: Family Medicine

## 2024-02-02 ENCOUNTER — Telehealth: Payer: Self-pay | Admitting: Internal Medicine

## 2024-02-02 ENCOUNTER — Emergency Department (HOSPITAL_COMMUNITY)

## 2024-02-02 ENCOUNTER — Other Ambulatory Visit: Payer: Self-pay

## 2024-02-02 ENCOUNTER — Ambulatory Visit: Payer: Self-pay

## 2024-02-02 ENCOUNTER — Ambulatory Visit: Admitting: Internal Medicine

## 2024-02-02 DIAGNOSIS — R451 Restlessness and agitation: Secondary | ICD-10-CM | POA: Diagnosis not present

## 2024-02-02 DIAGNOSIS — E860 Dehydration: Secondary | ICD-10-CM | POA: Diagnosis not present

## 2024-02-02 DIAGNOSIS — I10 Essential (primary) hypertension: Secondary | ICD-10-CM | POA: Diagnosis not present

## 2024-02-02 DIAGNOSIS — E7800 Pure hypercholesterolemia, unspecified: Secondary | ICD-10-CM | POA: Diagnosis present

## 2024-02-02 DIAGNOSIS — C4310 Malignant melanoma of unspecified eyelid, including canthus: Secondary | ICD-10-CM | POA: Diagnosis not present

## 2024-02-02 DIAGNOSIS — F05 Delirium due to known physiological condition: Secondary | ICD-10-CM | POA: Diagnosis not present

## 2024-02-02 DIAGNOSIS — S72002A Fracture of unspecified part of neck of left femur, initial encounter for closed fracture: Secondary | ICD-10-CM | POA: Diagnosis present

## 2024-02-02 DIAGNOSIS — M25561 Pain in right knee: Secondary | ICD-10-CM | POA: Diagnosis not present

## 2024-02-02 DIAGNOSIS — K5900 Constipation, unspecified: Secondary | ICD-10-CM | POA: Diagnosis not present

## 2024-02-02 DIAGNOSIS — Z4789 Encounter for other orthopedic aftercare: Secondary | ICD-10-CM | POA: Diagnosis not present

## 2024-02-02 DIAGNOSIS — Z79899 Other long term (current) drug therapy: Secondary | ICD-10-CM | POA: Diagnosis not present

## 2024-02-02 DIAGNOSIS — Z923 Personal history of irradiation: Secondary | ICD-10-CM | POA: Diagnosis not present

## 2024-02-02 DIAGNOSIS — Z7401 Bed confinement status: Secondary | ICD-10-CM | POA: Diagnosis not present

## 2024-02-02 DIAGNOSIS — Z9889 Other specified postprocedural states: Secondary | ICD-10-CM | POA: Diagnosis not present

## 2024-02-02 DIAGNOSIS — R4189 Other symptoms and signs involving cognitive functions and awareness: Secondary | ICD-10-CM | POA: Diagnosis not present

## 2024-02-02 DIAGNOSIS — Z9221 Personal history of antineoplastic chemotherapy: Secondary | ICD-10-CM

## 2024-02-02 DIAGNOSIS — J449 Chronic obstructive pulmonary disease, unspecified: Secondary | ICD-10-CM | POA: Diagnosis not present

## 2024-02-02 DIAGNOSIS — I2510 Atherosclerotic heart disease of native coronary artery without angina pectoris: Secondary | ICD-10-CM | POA: Diagnosis not present

## 2024-02-02 DIAGNOSIS — S72012A Unspecified intracapsular fracture of left femur, initial encounter for closed fracture: Secondary | ICD-10-CM | POA: Diagnosis not present

## 2024-02-02 DIAGNOSIS — R202 Paresthesia of skin: Secondary | ICD-10-CM | POA: Diagnosis not present

## 2024-02-02 DIAGNOSIS — R21 Rash and other nonspecific skin eruption: Secondary | ICD-10-CM | POA: Diagnosis not present

## 2024-02-02 DIAGNOSIS — Z7982 Long term (current) use of aspirin: Secondary | ICD-10-CM

## 2024-02-02 DIAGNOSIS — R531 Weakness: Secondary | ICD-10-CM | POA: Diagnosis not present

## 2024-02-02 DIAGNOSIS — Z85820 Personal history of malignant melanoma of skin: Secondary | ICD-10-CM

## 2024-02-02 DIAGNOSIS — Z87891 Personal history of nicotine dependence: Secondary | ICD-10-CM

## 2024-02-02 DIAGNOSIS — G3184 Mild cognitive impairment, so stated: Secondary | ICD-10-CM | POA: Diagnosis present

## 2024-02-02 DIAGNOSIS — D529 Folate deficiency anemia, unspecified: Secondary | ICD-10-CM | POA: Diagnosis not present

## 2024-02-02 DIAGNOSIS — F1090 Alcohol use, unspecified, uncomplicated: Secondary | ICD-10-CM | POA: Diagnosis present

## 2024-02-02 DIAGNOSIS — F1010 Alcohol abuse, uncomplicated: Secondary | ICD-10-CM | POA: Diagnosis present

## 2024-02-02 DIAGNOSIS — D62 Acute posthemorrhagic anemia: Secondary | ICD-10-CM | POA: Diagnosis not present

## 2024-02-02 DIAGNOSIS — E559 Vitamin D deficiency, unspecified: Secondary | ICD-10-CM | POA: Diagnosis not present

## 2024-02-02 DIAGNOSIS — M25551 Pain in right hip: Secondary | ICD-10-CM | POA: Diagnosis not present

## 2024-02-02 DIAGNOSIS — E872 Acidosis, unspecified: Secondary | ICD-10-CM | POA: Diagnosis present

## 2024-02-02 DIAGNOSIS — R000 Tachycardia, unspecified: Secondary | ICD-10-CM | POA: Diagnosis not present

## 2024-02-02 DIAGNOSIS — Z9181 History of falling: Secondary | ICD-10-CM | POA: Diagnosis not present

## 2024-02-02 DIAGNOSIS — Z8249 Family history of ischemic heart disease and other diseases of the circulatory system: Secondary | ICD-10-CM

## 2024-02-02 DIAGNOSIS — S72002D Fracture of unspecified part of neck of left femur, subsequent encounter for closed fracture with routine healing: Secondary | ICD-10-CM | POA: Diagnosis not present

## 2024-02-02 DIAGNOSIS — M25552 Pain in left hip: Secondary | ICD-10-CM | POA: Diagnosis not present

## 2024-02-02 DIAGNOSIS — N1831 Chronic kidney disease, stage 3a: Secondary | ICD-10-CM | POA: Diagnosis present

## 2024-02-02 DIAGNOSIS — L981 Factitial dermatitis: Secondary | ICD-10-CM | POA: Diagnosis not present

## 2024-02-02 DIAGNOSIS — E519 Thiamine deficiency, unspecified: Secondary | ICD-10-CM | POA: Diagnosis not present

## 2024-02-02 DIAGNOSIS — Z2989 Encounter for other specified prophylactic measures: Secondary | ICD-10-CM | POA: Diagnosis not present

## 2024-02-02 DIAGNOSIS — R200 Anesthesia of skin: Secondary | ICD-10-CM | POA: Diagnosis present

## 2024-02-02 DIAGNOSIS — S72042A Displaced fracture of base of neck of left femur, initial encounter for closed fracture: Secondary | ICD-10-CM | POA: Diagnosis not present

## 2024-02-02 DIAGNOSIS — E876 Hypokalemia: Secondary | ICD-10-CM | POA: Diagnosis not present

## 2024-02-02 DIAGNOSIS — W010XXA Fall on same level from slipping, tripping and stumbling without subsequent striking against object, initial encounter: Secondary | ICD-10-CM | POA: Diagnosis present

## 2024-02-02 DIAGNOSIS — Z823 Family history of stroke: Secondary | ICD-10-CM

## 2024-02-02 DIAGNOSIS — Z471 Aftercare following joint replacement surgery: Secondary | ICD-10-CM | POA: Diagnosis not present

## 2024-02-02 DIAGNOSIS — D696 Thrombocytopenia, unspecified: Secondary | ICD-10-CM | POA: Diagnosis present

## 2024-02-02 DIAGNOSIS — M810 Age-related osteoporosis without current pathological fracture: Secondary | ICD-10-CM | POA: Diagnosis present

## 2024-02-02 DIAGNOSIS — E782 Mixed hyperlipidemia: Secondary | ICD-10-CM | POA: Diagnosis not present

## 2024-02-02 DIAGNOSIS — M47816 Spondylosis without myelopathy or radiculopathy, lumbar region: Secondary | ICD-10-CM | POA: Diagnosis not present

## 2024-02-02 DIAGNOSIS — I129 Hypertensive chronic kidney disease with stage 1 through stage 4 chronic kidney disease, or unspecified chronic kidney disease: Secondary | ICD-10-CM | POA: Diagnosis not present

## 2024-02-02 DIAGNOSIS — R32 Unspecified urinary incontinence: Secondary | ICD-10-CM | POA: Diagnosis not present

## 2024-02-02 DIAGNOSIS — D519 Vitamin B12 deficiency anemia, unspecified: Secondary | ICD-10-CM | POA: Diagnosis not present

## 2024-02-02 DIAGNOSIS — Z981 Arthrodesis status: Secondary | ICD-10-CM | POA: Diagnosis not present

## 2024-02-02 DIAGNOSIS — M1990 Unspecified osteoarthritis, unspecified site: Secondary | ICD-10-CM | POA: Diagnosis not present

## 2024-02-02 DIAGNOSIS — R410 Disorientation, unspecified: Secondary | ICD-10-CM | POA: Diagnosis not present

## 2024-02-02 DIAGNOSIS — F17200 Nicotine dependence, unspecified, uncomplicated: Secondary | ICD-10-CM | POA: Diagnosis not present

## 2024-02-02 DIAGNOSIS — W19XXXA Unspecified fall, initial encounter: Secondary | ICD-10-CM | POA: Diagnosis not present

## 2024-02-02 DIAGNOSIS — F0390 Unspecified dementia without behavioral disturbance: Secondary | ICD-10-CM | POA: Diagnosis present

## 2024-02-02 DIAGNOSIS — S32502A Unspecified fracture of left pubis, initial encounter for closed fracture: Secondary | ICD-10-CM | POA: Diagnosis not present

## 2024-02-02 DIAGNOSIS — E538 Deficiency of other specified B group vitamins: Secondary | ICD-10-CM | POA: Diagnosis present

## 2024-02-02 DIAGNOSIS — R112 Nausea with vomiting, unspecified: Secondary | ICD-10-CM | POA: Diagnosis not present

## 2024-02-02 DIAGNOSIS — Z85118 Personal history of other malignant neoplasm of bronchus and lung: Secondary | ICD-10-CM

## 2024-02-02 DIAGNOSIS — N179 Acute kidney failure, unspecified: Secondary | ICD-10-CM | POA: Diagnosis present

## 2024-02-02 DIAGNOSIS — Z880 Allergy status to penicillin: Secondary | ICD-10-CM

## 2024-02-02 DIAGNOSIS — Z043 Encounter for examination and observation following other accident: Secondary | ICD-10-CM | POA: Diagnosis not present

## 2024-02-02 HISTORY — DX: Other amnesia: R41.3

## 2024-02-02 LAB — BASIC METABOLIC PANEL WITH GFR
Anion gap: 11 (ref 5–15)
BUN: 16 mg/dL (ref 8–23)
CO2: 21 mmol/L — ABNORMAL LOW (ref 22–32)
Calcium: 9.2 mg/dL (ref 8.9–10.3)
Chloride: 102 mmol/L (ref 98–111)
Creatinine, Ser: 1.05 mg/dL — ABNORMAL HIGH (ref 0.44–1.00)
GFR, Estimated: 57 mL/min — ABNORMAL LOW (ref >60)
Glucose, Bld: 117 mg/dL — ABNORMAL HIGH (ref 70–99)
Potassium: 3.9 mmol/L (ref 3.5–5.1)
Sodium: 134 mmol/L — ABNORMAL LOW (ref 135–145)

## 2024-02-02 LAB — TYPE AND SCREEN
ABO/RH(D): A POS
Antibody Screen: NEGATIVE

## 2024-02-02 LAB — CBC WITH DIFFERENTIAL/PLATELET
Abs Immature Granulocytes: 0.07 10*3/uL (ref 0.00–0.07)
Basophils Absolute: 0.1 10*3/uL (ref 0.0–0.1)
Basophils Relative: 1 %
Eosinophils Absolute: 0.1 10*3/uL (ref 0.0–0.5)
Eosinophils Relative: 1 %
HCT: 36.7 % (ref 36.0–46.0)
Hemoglobin: 11.9 g/dL — ABNORMAL LOW (ref 12.0–15.0)
Immature Granulocytes: 1 %
Lymphocytes Relative: 4 %
Lymphs Abs: 0.4 10*3/uL — ABNORMAL LOW (ref 0.7–4.0)
MCH: 31.2 pg (ref 26.0–34.0)
MCHC: 32.4 g/dL (ref 30.0–36.0)
MCV: 96.3 fL (ref 80.0–100.0)
Monocytes Absolute: 0.8 10*3/uL (ref 0.1–1.0)
Monocytes Relative: 8 %
Neutro Abs: 9.1 10*3/uL — ABNORMAL HIGH (ref 1.7–7.7)
Neutrophils Relative %: 85 %
Platelets: 136 10*3/uL — ABNORMAL LOW (ref 150–400)
RBC: 3.81 MIL/uL — ABNORMAL LOW (ref 3.87–5.11)
RDW: 12.5 % (ref 11.5–15.5)
WBC: 10.4 10*3/uL (ref 4.0–10.5)
nRBC: 0 % (ref 0.0–0.2)

## 2024-02-02 LAB — PROTIME-INR
INR: 1 (ref 0.8–1.2)
Prothrombin Time: 12.9 s (ref 11.4–15.2)

## 2024-02-02 MED ORDER — THIAMINE HCL 100 MG/ML IJ SOLN
100.0000 mg | Freq: Every day | INTRAMUSCULAR | Status: DC
  Filled 2024-02-02: qty 2

## 2024-02-02 MED ORDER — FOLIC ACID 1 MG PO TABS
1.0000 mg | ORAL_TABLET | Freq: Every day | ORAL | Status: DC
  Administered 2024-02-04 – 2024-02-12 (×9): 1 mg via ORAL
  Filled 2024-02-02 (×10): qty 1

## 2024-02-02 MED ORDER — VITAMIN D 25 MCG (1000 UNIT) PO TABS
1000.0000 [IU] | ORAL_TABLET | Freq: Every day | ORAL | Status: DC
  Administered 2024-02-04 – 2024-02-12 (×9): 1000 [IU] via ORAL
  Filled 2024-02-02 (×10): qty 1

## 2024-02-02 MED ORDER — OXYCODONE HCL 5 MG PO TABS
5.0000 mg | ORAL_TABLET | ORAL | Status: DC | PRN
  Administered 2024-02-03 – 2024-02-08 (×9): 5 mg via ORAL
  Filled 2024-02-02 (×9): qty 1

## 2024-02-02 MED ORDER — LORAZEPAM 1 MG PO TABS: 1.0000 mg | ORAL_TABLET | ORAL | Status: AC | PRN

## 2024-02-02 MED ORDER — SODIUM CHLORIDE 0.9 % IV BOLUS
1000.0000 mL | Freq: Once | INTRAVENOUS | Status: AC
  Administered 2024-02-03: 1000 mL via INTRAVENOUS

## 2024-02-02 MED ORDER — FENTANYL CITRATE PF 50 MCG/ML IJ SOSY
50.0000 ug | PREFILLED_SYRINGE | INTRAMUSCULAR | Status: DC | PRN
  Administered 2024-02-03: 50 ug via INTRAVENOUS
  Filled 2024-02-02: qty 1

## 2024-02-02 MED ORDER — LORAZEPAM 1 MG PO TABS
0.0000 mg | ORAL_TABLET | Freq: Two times a day (BID) | ORAL | Status: AC
  Filled 2024-02-02: qty 1

## 2024-02-02 MED ORDER — LORAZEPAM 1 MG PO TABS: 0.0000 mg | ORAL_TABLET | Freq: Four times a day (QID) | ORAL | Status: AC

## 2024-02-02 MED ORDER — HYDROMORPHONE HCL 1 MG/ML IJ SOLN: 0.5000 mg | INTRAMUSCULAR | Status: DC | PRN

## 2024-02-02 MED ORDER — THIAMINE MONONITRATE 100 MG PO TABS
100.0000 mg | ORAL_TABLET | Freq: Every day | ORAL | Status: DC
  Administered 2024-02-04 – 2024-02-12 (×9): 100 mg via ORAL
  Filled 2024-02-02 (×10): qty 1

## 2024-02-02 MED ORDER — SODIUM CHLORIDE 0.9% FLUSH
3.0000 mL | Freq: Two times a day (BID) | INTRAVENOUS | Status: DC
  Administered 2024-02-04 (×2): 3 mL via INTRAVENOUS

## 2024-02-02 MED ORDER — LORAZEPAM 2 MG/ML IJ SOLN: 1.0000 mg | INTRAMUSCULAR | Status: DC | PRN

## 2024-02-02 MED ORDER — THIAMINE MONONITRATE 100 MG PO TABS
100.0000 mg | ORAL_TABLET | Freq: Every day | ORAL | Status: DC
  Administered 2024-02-04 – 2024-02-05 (×2): 100 mg via ORAL
  Filled 2024-02-02 (×3): qty 1

## 2024-02-02 MED ORDER — LORAZEPAM 2 MG/ML IJ SOLN: 0.0000 mg | Freq: Four times a day (QID) | INTRAMUSCULAR | Status: AC

## 2024-02-02 MED ORDER — ADULT MULTIVITAMIN W/MINERALS CH
1.0000 | ORAL_TABLET | Freq: Every day | ORAL | Status: DC
  Administered 2024-02-04 – 2024-02-12 (×5): 1 via ORAL
  Filled 2024-02-02 (×10): qty 1

## 2024-02-02 MED ORDER — LORAZEPAM 2 MG/ML IJ SOLN: 0.0000 mg | Freq: Two times a day (BID) | INTRAMUSCULAR | Status: DC

## 2024-02-02 MED ORDER — THIAMINE HCL 100 MG/ML IJ SOLN: 100.0000 mg | Freq: Every day | INTRAMUSCULAR | Status: DC

## 2024-02-02 MED ORDER — ACETAMINOPHEN 500 MG PO TABS
1000.0000 mg | ORAL_TABLET | Freq: Four times a day (QID) | ORAL | Status: DC | PRN
  Administered 2024-02-04 – 2024-02-05 (×2): 1000 mg via ORAL
  Filled 2024-02-02 (×3): qty 2

## 2024-02-02 MED ORDER — SODIUM CHLORIDE 0.9 % IV SOLN: INTRAVENOUS | Status: AC

## 2024-02-02 MED ORDER — OXYCODONE HCL 5 MG PO TABS: 2.5000 mg | ORAL_TABLET | ORAL | Status: DC | PRN

## 2024-02-02 NOTE — H&P (Signed)
 History and Physical    [name] [name] ZOX:[id number] DOB: 1951-05-26 DOA: 02/02/2024  PCP: [name], MD   Patient coming from: Home   Chief Complaint:  Chief Complaint  Patient presents with   Fall    HPI: History limited by patients cognitive impairment  [name] [name] is a 73 y.o. female with hx of limited SCLC s/p chemo, radiation, prophylactic cranial radiation, CAD by imaging, mild cognitive impairment, alcohol use disorder, CKD3a, HTN, HLD,  B12 deficiency, osteoporosis with prior vertebral fracture, who presents after ground level fall. Reports mechanical fall trying to get into bed, got tripped up on her dog, this occurred yesterday evening. Today with worsening pain and inability to transfer prompting ED visit. She has chronic numbness in her feet, no new numbness or weakness. Reports L hip pain is the worst but also c/o pain in the R hip. Denies hitting her head, is not on anticoagulants. No other areas of injury. No syncope. Otherwise reports 1 day of N/V/D and limited POs. Reports ongoing heavy alcohol use with 1-2 bottles of wine daily    Review of Systems:  ROS complete and negative except as marked above   Allergies  Allergen Reactions   Tramadol  Hcl Nausea And Vomiting   Codeine Nausea And Vomiting   Penicillins Rash    Prior to Admission medications   Medication Sig Start Date End Date Taking? Authorizing Provider  acetaminophen  (TYLENOL ) 500 MG tablet Take 500 mg by mouth every 6 (six) hours as needed.   Yes [provider]  aspirin  EC 81 MG tablet Take 81 mg daily by mouth.   Yes [provider]  azithromycin  (ZITHROMAX ) 250 MG tablet Take 1 tablet (250 mg total) by mouth daily. Take first 2 tablets together, then 1 every day until finished. 07/30/23   Russella Courts A, DO  carvedilol  (COREG ) 6.25 MG tablet TAKE 1 TABLET(6.25 MG) BY MOUTH TWICE DAILY WITH A MEAL 11/30/22   Webb, Padonda B, FNP  cimetidine (TAGAMET) 200 MG tablet  Take 200 mg by mouth 2 (two) times daily.    [provider]  citalopram  (CELEXA ) 20 MG tablet Take 1 tablet (20 mg total) by mouth daily. 01/26/24   [name], MD  donepezil  (ARICEPT ) 5 MG tablet Take 1 tablet (5 mg total) by mouth at bedtime. 07/13/23   [name], MD  Evolocumab  (REPATHA  SURECLICK) 140 MG/ML SOAJ INJECT 140MG  UNDER THE SKIN ONCE EVERY 14 DAYS 01/06/24   Hilty, Aviva Lemmings, MD  guaiFENesin -dextromethorphan (ROBITUSSIN DM) 100-10 MG/5ML syrup Take 5 mLs by mouth every 4 (four) hours as needed for cough. 07/30/23   Teddi Favors, DO  lidocaine  (LIDODERM ) 5 % Place 1 patch onto the skin daily. Remove & Discard patch within 12 hours or as directed by MD 07/17/23   Maralee Senate, April, MD  predniSONE  (DELTASONE ) 20 MG tablet 3 tabs po day one, then 2 po daily x 4 days 07/17/23   Palumbo, April, MD  raloxifene  (EVISTA ) 60 MG tablet Take 1 tablet (60 mg total) by mouth daily. 04/03/20   Hazle Lites, MD  rosuvastatin  (CRESTOR ) 40 MG tablet TAKE 1 TABLET(40 MG) BY MOUTH AT BEDTIME 02/02/23   Hilty, Aviva Lemmings, MD    Past Medical History:  Diagnosis Date   Anemia    duering cancer treatment    Anxiety    Arthritis    OA hands    Bilateral lower extremity pain    Blood transfusion without reported  diagnosis    during chemo for lung cancer   Cataract    forming  but very small    Cholelithiasis    Colon polyps    adenomatous   Concussion 08/25/2021   ETOHdependence (HCC)    High cholesterol    History of chemotherapy    for lung cancer- completed 2013   Hx of radiation therapy 10/07/11 to 11/20/11   right lung   Hx of radiation therapy 01/07/2012- 01/21/12   cranial irradiation   Hypertension    Lung cancer (HCC) 09/19/2011   Stage III currently in remission   Malignant melanoma of skin of canthus of right eye (HCC) 02/2017   Osteoporosis 04/2014   Dexa scan by Dr. Asencion Blacksmith, T-score -2.7, next in 2 years, with at least 13 % major fracture in 10 years   Pitting edema     Bilateral lower extremities, duplex ultrasound 04/03/2015 normal, no DVT    Past Surgical History:  Procedure Laterality Date   CHOLECYSTECTOMY  04/2011   biliary stent placement   CLOSED REDUCTION FINGER WITH PERCUTANEOUS PINNING Left 10/23/2021   Procedure: LEFT INDEX, MIDDLE, RING, AND SMALL FINGER CLOSED REDUCTION FINGER WITH PERCUTANEOUS PINNING;  Surgeon: Marilyn Shropshire, MD;  Location: [facility];  Service: Orthopedics;  Laterality: Left;   COLONOSCOPY     INCISIONAL HERNIA REPAIR  2015   IR KYPHO LUMBAR INC FX REDUCE BONE BX UNI/BIL CANNULATION INC/IMAGING  09/18/2022   MOHS SURGERY Right 02/2017   cheek and eyelid   [place] Fat Touch     Vaginal procedure   POLYPECTOMY     TUBAL LIGATION       reports that she quit smoking about 12 years ago. Her smoking use included cigarettes. She has never used smokeless tobacco. She reports current alcohol use. She reports that she does not use drugs.  Family History  Problem Relation Age of Onset   Bladder Cancer Father    Stroke Father    Colon polyps Father    Atrial fibrillation Mother    Heart disease Mother        CHF   Clotting disorder Mother        warfarin   Colon polyps Mother    Colon cancer Maternal Aunt 88   Drug abuse Son    Breast cancer Neg Hx    Esophageal cancer Neg Hx    Stomach cancer Neg Hx    Rectal cancer Neg Hx      Physical Exam: Vitals:   02/02/24 1907 02/02/24 1910 02/02/24 1922  BP:  (!) 126/96   Pulse:  (!) 106   Resp:   18  Temp:   98.6 F (37 C)  TempSrc:   Oral  SpO2:  96%   Weight: 68 kg    Height: 5\' 7"  (1.702 m)      Gen: Awake, alert, chronically ill appearing.  HEENT: Atraumatic  Neck: C spine is nontender.   CV: Regular, normal S1, S2, no murmurs  Resp: Normal WOB, CTAB  Abd: Flat, normoactive, nontender MSK: Symmetric, no edema, distally NV intact  Skin: No rashes or lesions to exposed skin  Neuro: Alert and interactive, oriented to person, hospital (not [facility]),  and 2025, somewhat to situation.  Psych: slow speech    Data review:   Labs reviewed, notable for:   Bicarb 21, no AG  Hb 11, PLT 136   Micro:  Results for orders placed or performed during the hospital encounter of 07/30/23  Resp panel by RT-PCR (RSV, Flu A&B, Covid) Anterior Nasal Swab     Status: None   Collection Time: 07/30/23  3:44 AM   Specimen: Anterior Nasal Swab  Result Value Ref Range Status   SARS Coronavirus 2 by RT PCR NEGATIVE NEGATIVE Final    Comment: (NOTE) SARS-CoV-2 target nucleic acids are NOT DETECTED.  The SARS-CoV-2 RNA is generally detectable in upper respiratory specimens during the acute phase of infection. The lowest concentration of SARS-CoV-2 viral copies this assay can detect is 138 copies/mL. A negative result does not preclude SARS-Cov-2 infection and should not be used as the sole basis for treatment or other patient management decisions. A negative result may occur with  improper specimen collection/handling, submission of specimen other than nasopharyngeal swab, presence of viral mutation(s) within the areas targeted by this assay, and inadequate number of viral copies(<138 copies/mL). A negative result must be combined with clinical observations, patient history, and epidemiological information. The expected result is Negative.  Fact Sheet for Patients:  BloggerCourse.com  Fact Sheet for Healthcare Providers:  SeriousBroker.it  This test is no t yet approved or cleared by the [place]  FDA and  has been authorized for detection and/or diagnosis of SARS-CoV-2 by FDA under an Emergency Use Authorization (EUA). This EUA will remain  in effect (meaning this test can be used) for the duration of the COVID-19 declaration under Section 564(b)(1) of the Act, 21 U.S.C.section 360bbb-3(b)(1), unless the authorization is terminated  or revoked sooner.       Influenza A by PCR NEGATIVE  NEGATIVE Final   Influenza B by PCR NEGATIVE NEGATIVE Final    Comment: (NOTE) The Xpert Xpress SARS-CoV-2/FLU/RSV plus assay is intended as an aid in the diagnosis of influenza from Nasopharyngeal swab specimens and should not be used as a sole basis for treatment. Nasal washings and aspirates are unacceptable for Xpert Xpress SARS-CoV-2/FLU/RSV testing.  Fact Sheet for Patients: BloggerCourse.com  Fact Sheet for Healthcare Providers: SeriousBroker.it  This test is not yet approved or cleared by the [place]  FDA and has been authorized for detection and/or diagnosis of SARS-CoV-2 by FDA under an Emergency Use Authorization (EUA). This EUA will remain in effect (meaning this test can be used) for the duration of the COVID-19 declaration under Section 564(b)(1) of the Act, 21 U.S.C. section 360bbb-3(b)(1), unless the authorization is terminated or revoked.     Resp Syncytial Virus by PCR NEGATIVE NEGATIVE Final    Comment: (NOTE) Fact Sheet for Patients: BloggerCourse.com  Fact Sheet for Healthcare Providers: SeriousBroker.it  This test is not yet approved or cleared by the [place]  FDA and has been authorized for detection and/or diagnosis of SARS-CoV-2 by FDA under an Emergency Use Authorization (EUA). This EUA will remain in effect (meaning this test can be used) for the duration of the COVID-19 declaration under Section 564(b)(1) of the Act, 21 U.S.C. section 360bbb-3(b)(1), unless the authorization is terminated or revoked.  Performed at [facility], [address] [place], [place] [address]     Imaging reviewed:  CT Hip Left Wo Contrast Result Date: 02/02/2024 CLINICAL DATA:  Hip trauma, fracture suspected EXAM: CT OF THE LEFT HIP WITHOUT CONTRAST TECHNIQUE: Multidetector CT imaging of the left hip was performed according to the standard  protocol. Multiplanar CT image reconstructions were also generated. RADIATION DOSE REDUCTION: This exam was performed according to the departmental dose-optimization program which includes automated exposure control, adjustment of the mA and/or kV according to patient size  and/or use of iterative reconstruction technique. COMPARISON:  Same day hip radiograph FINDINGS: Bones/Joint/Cartilage Nondisplaced mildly impacted subcapital femoral neck fracture of the left femur. Remote pubic rami fractures on the left. Ligaments Suboptimally assessed by CT. Muscles and Tendons Grossly intact. Soft tissues Unremarkable. IMPRESSION: Nondisplaced mildly impacted subcapital femoral neck fracture of the left femur. Electronically Signed   By: Rozell Cornet M.D.   On: 02/02/2024 22:08   DG Hip Unilat With Pelvis 2-3 Views Left Result Date: 02/02/2024 CLINICAL DATA:  Marvell Slider 1 day ago EXAM: DG HIP (WITH OR WITHOUT PELVIS) 2-3V LEFT COMPARISON:  None Available. FINDINGS: Curvilinear lucency with surrounding sclerosis about the subcapital left femoral neck suspicious for nondisplaced fracture. No dislocation. Remote fractures of the left pubic rami. Degenerative changes pubic symphysis, both hips, SI joints and lower lumbar spine. IMPRESSION: Findings suspicious for nondisplaced subcapital left femoral neck fracture. CT recommended for further evaluation. Electronically Signed   By: Rozell Cornet M.D.   On: 02/02/2024 20:16   DG Chest Port 1 View Result Date: 02/02/2024 CLINICAL DATA:  Fall EXAM: PORTABLE CHEST 1 VIEW COMPARISON:  07/30/2023, CT chest 01/21/2024, 08/25/2021 FINDINGS: Distortion and bandlike fibrosis and scarring in the right hilar/suprahilar region. No acute airspace disease. Stable cardiomediastinal silhouette. No pneumothorax IMPRESSION: No active disease. Stable distortion and bandlike fibrosis and scarring in the right hilar/suprahilar region. Electronically Signed   By: Esmeralda Hedge M.D.   On: 02/02/2024  20:15    EKG:  Personally reviewed, SR at 104, no acute ischemic changes.    ED Course:  Edp discussed case with Dr. Adrain Alar with tentative plan for OR tomorrow, EDP also discussed with mpoa sister [name] [name]    Assessment/Plan:  73 y.o. female with hx limited SCLC s/p chemo, radiation, prophylactic cranial radiation, CAD by imaging, mild cognitive impairment, alcohol use disorder, CKD3a, HTN, HLD,  B12 deficiency, osteoporosis with prior vertebral fracture, who presents after ground level fall c/b left femoral neck fracture.    Left femoral neck fracture Ground-level fall, mechanical  Clinical diagnosis osteoporosis - EDP consulted with Orthopedic surgery Dr. Adrain Alar, tentative plan for OR tomorrow - Note due to her mild cognitive impairment she will not be able to consent for herself and will need Sister MPOA [name] [name] to consent for procedure.  - N.p.o. after midnight - Hold home aspirin , secondary ppx for CAD by imaging  - DVT prophylaxis to be readdressed postop; SCD for now  - Tele monitoring periop - PT/ OT eval postop ordered   - Pain mgmt: Tylenol  prn for mild, oxycodone  2.5/5 mg p.o. every 4 hours prn for moderate/severe, Dilaudid  0.5 mg IV every 4 hours prn for breakthrough - Check vitamin D , start vitamin D3 1000 IU daily - Will need follow-up with PCP for further treatment of osteoporosis   Alcohol use disorder Continue to heavy alcohol use with 1-2 bottles of wine daily.  No history of severe withdrawals in the past. - CIWA with Ativan  as needed - TOC consult for substance abuse resources - Timing, multivitamin, folate. - Will need additional counseling re: moderation of alcohol use, this was not the focus of initial interview  Recent N/V/D, dehydration  NAGMA likely from diarrhea  -Give 1 L IV fluid, continue MIVF NS at 75 cc an hour while n.p.o.  Chronic medical problems: SCLC s/p chemo, radiation, prophylactic cranial radiation: C/b radiation fibrosis on the R. Has seen  Dr. Liam Redhead and no evidence of recurrent disease. On surveillance outpatient.  CAD by imaging/HLD: Holding aspirin . On  [facility]  outpatient. Continue rosuvastatin .  mild cognitive impairment: Noted  CKD3a: Baseline Cr ~ 0.8 - 1.  HTN: Not on antihypertensives.  B12 deficiency: repeat B12 with hx reported neuropathy. last check in 11/'24 was indeterminate range.   Body mass index is 23.48 kg/m.    DVT prophylaxis:  SCDs Code Status:  Full Code Diet:  Diet Orders (From admission, onward)     Start     Ordered   02/03/24 0001  Diet NPO time specified Except for: Sips with Meds  Diet effective midnight       Question:  Except for  Answer:  Bula Carney with Meds   02/02/24 2232   02/02/24 1920  Diet NPO time specified  Diet effective now        02/02/24 1920           Family Communication:  Discussed with caregiver at bedside; EDP discussed with sister mpoa [name] [name]   Consults:  Orthopedics   Admission status:   Inpatient, Telemetry bed  Severity of Illness: The appropriate patient status for this patient is INPATIENT. Inpatient status is judged to be reasonable and necessary in order to provide the required intensity of service to ensure the patient's safety. The patient's presenting symptoms, physical exam findings, and initial radiographic and laboratory data in the context of their chronic comorbidities is felt to place them at high risk for further clinical deterioration. Furthermore, it is not anticipated that the patient will be medically stable for discharge from the hospital within 2 midnights of admission.   * I certify that at the point of admission it is my clinical judgment that the patient will require inpatient hospital care spanning beyond 2 midnights from the point of admission due to high intensity of service, high risk for further deterioration and high frequency of surveillance required.*   Arnulfo Larch, MD [facility]  How to contact the [facility] Attending or  Consulting provider 7A - 7P or covering provider during after hours 7P -7A, for this patient.  Check the care team in [facility] and look for a) attending/consulting [facility] provider listed and b) the [facility] team listed Log into [email or web address] and use 's universal password to access. If you do not have the password, please contact the hospital operator. Locate the [facility] provider you are looking for under [facility] and page to a number that you can be directly reached. If you still have difficulty reaching the provider, please page the [facility] (Director on Call) for the Hospitalists listed on amion for assistance.  02/02/2024, 10:52 PM

## 2024-02-02 NOTE — Telephone Encounter (Signed)
 FYI Only or Action Required?: FYI only for provider  Patient was last seen in primary care on 07/13/2023 by [name], MD. Called Nurse Triage reporting Fall and Back Pain. Symptoms began 2 day ago. Interventions attempted: Rest, hydration, or home remedies. Symptoms are: unchanged.  Triage Disposition: See Physician Within 24 Hours (overriding See HCP Within 4 Hours (Or PCP Triage))  Patient/caregiver understands and will follow disposition?: No, wishes to speak with PCP  Copied from CRM [id number]. Topic: Clinical - Red Word Triage >> Feb 02, 2024  1:02 PM [place] J wrote: [facility] that prompted transfer to Nurse Triage: Patient fell and hurt her back and could not make it to her appointment today. She needs to reschedule with Dr. Neomi Banks since she is out of her citalopram  (CELEXA ) 20 MG tablet. Reason for Disposition  [1] MODERATE weakness (i.e., interferes with work, school, normal activities) AND [2] new-onset or worsening  Answer Assessment - Initial Assessment Questions 1. MECHANISM: "How did the fall happen?"     Patient fell out of her bed and developed back pain.  2. DOMESTIC VIOLENCE AND ELDER ABUSE SCREENING: "Did you fall because someone pushed you or tried to hurt you?" If Yes, ask: "Are you safe now?"     no 3. ONSET: "When did the fall happen?" (e.g., minutes, hours, or days ago)     Two days ago 4. LOCATION: "What part of the body hit the ground?" (e.g., back, buttocks, head, hips, knees, hands, head, stomach)     Back pain 5. INJURY: "Did you hurt (injure) yourself when you fell?" If Yes, ask: "What did you injure? Tell me more about this?" (e.g., body area; type of injury; pain severity)"     Pain to back 6. PAIN: "Is there any pain?" If Yes, ask: "How bad is the pain?" (e.g., Scale 1-10; or mild,  moderate, severe)   - NONE (0): No pain   - MILD (1-3): Doesn't interfere with normal activities    - MODERATE (4-7): Interferes with normal activities or awakens from sleep    -  SEVERE (8-10): Excruciating pain, unable to do any normal activities      Moderate-severe 7. SIZE: For cuts, bruises, or swelling, ask: "How large is it?" (e.g., inches or centimeters)      No bruising 9. OTHER SYMPTOMS: "Do you have any other symptoms?" (e.g., dizziness, fever, weakness; new onset or worsening).      no 10. CAUSE: "What do you think caused the fall (or falling)?" (e.g., tripped, dizzy spell)   Rolled out of her bed  Patient sister Genevia Kern calling for patient requesting a new appointment. Genevia Kern states there was miscommunication and patient doesn't want to be dismissed from the practice. Genevia Kern endorses that patient rolled out of her bed and hurt her back. Patient didn't feel like she could make it to her appointment today. Genevia Kern is asking for staff to please call her back about patient's medication and to speak with someone about not being dismissed from Dr. Clearnce Curia service. Patient was rescheduled for a office visit for tomorrow for med refills. Transportation seems to be an issue for patient at times.  Protocols used: Falls and [facility]

## 2024-02-02 NOTE — ED Triage Notes (Signed)
 Patient to ED by EMS from home following a fall that happened last night while attempting to get out of bed. She c/o L hip pain, no loc, no thinners. Also states she has had diarrhea x 2 days. EMS started 20g in L forearm.

## 2024-02-02 NOTE — Telephone Encounter (Signed)
 Copied from CRM [id number]. Topic: Clinical - Medication Refill >> Feb 02, 2024 12:56 PM [place] J wrote: Medication: citalopram  (CELEXA ) 20 MG tablet  Has the patient contacted their pharmacy? No (Agent: If no, request that the patient contact the pharmacy for the refill. If patient does not wish to contact the pharmacy document the reason why and proceed with request.) (Agent: If yes, when and what did the pharmacy advise?)  This is the patient's preferred pharmacy:  [id number] - [place], [place] - [address] AT [address] & [place] [address] [place] [address] Phone: [phone] Fax: [phone]  Is this the correct pharmacy for this prescription? Yes If no, delete pharmacy and type the correct one.   Has the prescription been filled recently? No  Is the patient out of the medication? Yes  Has the patient been seen for an appointment in the last year OR does the patient have an upcoming appointment? Yes  Can we respond through MyChart? Yes  Agent: Please be advised that Rx refills may take up to 3 business days. We ask that you follow-up with your pharmacy.

## 2024-02-02 NOTE — Telephone Encounter (Signed)
 See previous entries, the patient request refills without office visit.  Apparently she does not feel comfortable see me again and the sister reports a "personality dispute".  I have the utmost respect for this patient, I feel sorry that she feels this way but under the circumstances she needs to   find a new PCP.    Please start the dismissal process.

## 2024-02-02 NOTE — ED Provider Notes (Addendum)
 [place] [facility] Provider Note   CSN: [id number] Arrival date & time: 02/02/24  1848     History  Chief Complaint  Patient presents with   [name] [name] is a 73 y.o. female.  HPI     73 year old female with history of hypertension, dyslipidemia, COPD, remote history of lung cancer comes in with chief complaint of fall.  Patient accompanied by caregiver who provides history primarily.   According to the caregiver, patient slipped out of the bed last night, when she was trying to get onto it.  She fell onto her left side, and has been complaining of severe pain when she tries to walk.  Patient does not take any pain meds.  The fall occurred yesterday evening.  Patient denies any headache, confusion, seizure-like activity, nausea, vomiting, one-sided weakness/numbness that is new or slurred speech.  Patient is not on any blood thinners.  She denies pain elsewhere.  Home Medications Prior to Admission medications   Medication Sig Start Date End Date Taking? Authorizing Provider  acetaminophen  (TYLENOL ) 500 MG tablet Take 500 mg by mouth every 6 (six) hours as needed.    [provider]  aspirin  EC 81 MG tablet Take 81 mg daily by mouth.    [provider]  azithromycin  (ZITHROMAX ) 250 MG tablet Take 1 tablet (250 mg total) by mouth daily. Take first 2 tablets together, then 1 every day until finished. 07/30/23   Teddi Favors, DO  carvedilol  (COREG ) 6.25 MG tablet TAKE 1 TABLET(6.25 MG) BY MOUTH TWICE DAILY WITH A MEAL 11/30/22   Webb, Padonda B, FNP  cimetidine (TAGAMET) 200 MG tablet Take 200 mg by mouth 2 (two) times daily.    [provider]  citalopram  (CELEXA ) 20 MG tablet Take 1 tablet (20 mg total) by mouth daily. 01/26/24   [name], MD  donepezil  (ARICEPT ) 5 MG tablet Take 1 tablet (5 mg total) by mouth at bedtime. 07/13/23   [name], MD  Evolocumab  (REPATHA  SURECLICK) 140 MG/ML SOAJ  INJECT 140MG  UNDER THE SKIN ONCE EVERY 14 DAYS 01/06/24   Hilty, Aviva Lemmings, MD  guaiFENesin -dextromethorphan (ROBITUSSIN DM) 100-10 MG/5ML syrup Take 5 mLs by mouth every 4 (four) hours as needed for cough. 07/30/23   Teddi Favors, DO  lidocaine  (LIDODERM ) 5 % Place 1 patch onto the skin daily. Remove & Discard patch within 12 hours or as directed by MD 07/17/23   Maralee Senate, April, MD  predniSONE  (DELTASONE ) 20 MG tablet 3 tabs po day one, then 2 po daily x 4 days 07/17/23   Palumbo, April, MD  raloxifene  (EVISTA ) 60 MG tablet Take 1 tablet (60 mg total) by mouth daily. 04/03/20   Hilty, Aviva Lemmings, MD  rosuvastatin  (CRESTOR ) 40 MG tablet TAKE 1 TABLET(40 MG) BY MOUTH AT BEDTIME 02/02/23   Hilty, Aviva Lemmings, MD      Allergies    Tramadol  hcl, Codeine, and Penicillins    Review of Systems   Review of Systems  All other systems reviewed and are negative.   Physical Exam Updated Vital Signs BP (!) 126/96   Pulse (!) 106   Temp 98.6 F (37 C) (Oral)   Resp 18   Ht 5\' 7"  (1.702 m)   Wt 68 kg   SpO2 96%   BMI 23.48 kg/m  Physical Exam Vitals and nursing note reviewed.  Constitutional:      Appearance: She is well-developed.  HENT:  Head: Atraumatic.  Cardiovascular:     Rate and Rhythm: Normal rate.  Pulmonary:     Effort: Pulmonary effort is normal.  Musculoskeletal:     Cervical back: Normal range of motion and neck supple.     Comments: Patient has tenderness over the left posterior hip and proximal femur  Skin:    General: Skin is warm and dry.  Neurological:     Mental Status: She is alert and oriented to person, place, and time.     ED Results / Procedures / Treatments   Labs (all labs ordered are listed, but only abnormal results are displayed) Labs Reviewed  BASIC METABOLIC PANEL WITH GFR - Abnormal; Notable for the following components:      Result Value   Sodium 134 (*)    CO2 21 (*)    Glucose, Bld 117 (*)    Creatinine, Ser 1.05 (*)    GFR, Estimated 57  (*)    All other components within normal limits  CBC WITH DIFFERENTIAL/PLATELET - Abnormal; Notable for the following components:   RBC 3.81 (*)    Hemoglobin 11.9 (*)    Platelets 136 (*)    Neutro Abs 9.1 (*)    Lymphs Abs 0.4 (*)    All other components within normal limits  PROTIME-INR  TYPE AND SCREEN    EKG EKG Interpretation Date/Time:  Tuesday February 02 2024 19:28:12 EDT Ventricular Rate:  104 PR Interval:  195 QRS Duration:  89 QT Interval:  347 QTC Calculation: 457 R Axis:   52  Text Interpretation: Sinus tachycardia Abnormal R-wave progression, early transition No acute changes No significant change since last tracing Confirmed by Deatra Face [phone]) on 02/02/2024 10:01:18 PM  Radiology CT Hip Left Wo Contrast Result Date: 02/02/2024 CLINICAL DATA:  Hip trauma, fracture suspected EXAM: CT OF THE LEFT HIP WITHOUT CONTRAST TECHNIQUE: Multidetector CT imaging of the left hip was performed according to the standard protocol. Multiplanar CT image reconstructions were also generated. RADIATION DOSE REDUCTION: This exam was performed according to the departmental dose-optimization program which includes automated exposure control, adjustment of the mA and/or kV according to patient size and/or use of iterative reconstruction technique. COMPARISON:  Same day hip radiograph FINDINGS: Bones/Joint/Cartilage Nondisplaced mildly impacted subcapital femoral neck fracture of the left femur. Remote pubic rami fractures on the left. Ligaments Suboptimally assessed by CT. Muscles and Tendons Grossly intact. Soft tissues Unremarkable. IMPRESSION: Nondisplaced mildly impacted subcapital femoral neck fracture of the left femur. Electronically Signed   By: Rozell Cornet M.D.   On: 02/02/2024 22:08   DG Hip Unilat With Pelvis 2-3 Views Left Result Date: 02/02/2024 CLINICAL DATA:  Marvell Slider 1 day ago EXAM: DG HIP (WITH OR WITHOUT PELVIS) 2-3V LEFT COMPARISON:  None Available. FINDINGS: Curvilinear  lucency with surrounding sclerosis about the subcapital left femoral neck suspicious for nondisplaced fracture. No dislocation. Remote fractures of the left pubic rami. Degenerative changes pubic symphysis, both hips, SI joints and lower lumbar spine. IMPRESSION: Findings suspicious for nondisplaced subcapital left femoral neck fracture. CT recommended for further evaluation. Electronically Signed   By: Rozell Cornet M.D.   On: 02/02/2024 20:16   DG Chest Port 1 View Result Date: 02/02/2024 CLINICAL DATA:  Fall EXAM: PORTABLE CHEST 1 VIEW COMPARISON:  07/30/2023, CT chest 01/21/2024, 08/25/2021 FINDINGS: Distortion and bandlike fibrosis and scarring in the right hilar/suprahilar region. No acute airspace disease. Stable cardiomediastinal silhouette. No pneumothorax IMPRESSION: No active disease. Stable distortion and bandlike fibrosis and scarring in  the right hilar/suprahilar region. Electronically Signed   By: Esmeralda Hedge M.D.   On: 02/02/2024 20:15    Procedures Procedures    Medications Ordered in ED Medications  fentaNYL  (SUBLIMAZE ) injection 50 mcg (has no administration in time range)    ED Course/ Medical Decision Making/ A&P                                 Medical Decision Making Amount and/or Complexity of Data Reviewed Labs: ordered. Radiology: ordered.  Risk OTC drugs. Prescription drug management. Decision regarding hospitalization.   73 year old patient comes in after sustaining what appears to be a mechanical fall. Pertinent past medical includes -no blood thinner use.  Patient has history of COPD, hypertension, hyperlipidemia and cognitive decline. Collateral history provided by caregiver, was at the bedside.  Based on my history and exam, differential diagnosis includes: -Left hip fracture - Traumatic brain injury including intracranial hemorrhage - Long bone fractures - Contusions - Soft tissue injury - Concussion  Based on the initial assessment, the  following workup was initiated x-ray of the hips, basic labs.  I do not think patient needs a CT scan of the brain.  Her fall was yesterday, she never struck her head and has had no red flags suggesting elevated ICP.  She is not on any blood thinners.  I feel comfortable clearing her C-spine clinically as well.   I have independently interpreted the following imaging from the perspective of acute trauma: xray femur - And the results indicate questionable femoral neck fracture.  10:15 PM X-ray was equivocal, CT ordered.  CT confirms femoral neck fracture.  I spoke with Dr. Adrain Alar -patient will need to be admitted, n.p.o. after midnight.  Results of the ED findings discussed with the patient.  I discussed the findings with the patient.  I also called patient's sister, who has the healthcare power of attorney to discuss the diagnosis and admission and the surgical plan.  She wants to be notified and consented from prior to any surgical evaluation.  I have informed medicine of this request.  The sister indicates that patient has history of heavy alcohol use.  Patient admits to drinking heavily, usually wine, usually 1 bottle or more every serving.  CIWA protocol initiated.  Final Clinical Impression(s) / ED Diagnoses Final diagnoses:  Closed subcapital fracture of left femur, initial encounter [facility])    Rx / DC Orders ED Discharge Orders     None         Deatra Face, MD 02/02/24 2216    Deatra Face, MD 02/02/24 2256

## 2024-02-02 NOTE — Telephone Encounter (Signed)
 Denied. Will do at time of appt tomorrow. She no showed her visit today

## 2024-02-02 NOTE — Telephone Encounter (Signed)
 Dellar Fenton has been made aware of dismissal is waiting on response regarding this message.  Copied from CRM [id number]. Topic: General - Other >> Feb 02, 2024  1:01 PM [place] J wrote: Reason for CRM: Patient's sister is calling and apologizing for the misstatement she made about [place] not wanting to see Dr. Neomi Banks anymore. The patient's sister claimed that she did not mean that and missundertood [place] and would like to take the statement back. She is asking that Dr. Neomi Banks does not go through with the patient dismissal.

## 2024-02-02 NOTE — Telephone Encounter (Signed)
 Dismissal sent to [facility]

## 2024-02-03 ENCOUNTER — Telehealth: Payer: Self-pay

## 2024-02-03 ENCOUNTER — Other Ambulatory Visit: Payer: Self-pay | Admitting: Internal Medicine

## 2024-02-03 ENCOUNTER — Ambulatory Visit: Admitting: Internal Medicine

## 2024-02-03 ENCOUNTER — Inpatient Hospital Stay (HOSPITAL_COMMUNITY)

## 2024-02-03 ENCOUNTER — Encounter (HOSPITAL_COMMUNITY)
Admission: EM | Disposition: A | Discharge status: Skilled Nursing Facility | Payer: Self-pay | Source: Home / Self Care | Attending: Internal Medicine

## 2024-02-03 ENCOUNTER — Other Ambulatory Visit: Payer: Self-pay

## 2024-02-03 ENCOUNTER — Encounter (HOSPITAL_COMMUNITY): Payer: Self-pay | Admitting: Internal Medicine

## 2024-02-03 ENCOUNTER — Inpatient Hospital Stay (HOSPITAL_COMMUNITY): Admitting: Anesthesiology

## 2024-02-03 DIAGNOSIS — J449 Chronic obstructive pulmonary disease, unspecified: Secondary | ICD-10-CM

## 2024-02-03 DIAGNOSIS — I10 Essential (primary) hypertension: Secondary | ICD-10-CM

## 2024-02-03 DIAGNOSIS — S72012A Unspecified intracapsular fracture of left femur, initial encounter for closed fracture: Secondary | ICD-10-CM | POA: Diagnosis not present

## 2024-02-03 DIAGNOSIS — I2510 Atherosclerotic heart disease of native coronary artery without angina pectoris: Secondary | ICD-10-CM

## 2024-02-03 DIAGNOSIS — S72002A Fracture of unspecified part of neck of left femur, initial encounter for closed fracture: Secondary | ICD-10-CM | POA: Diagnosis not present

## 2024-02-03 HISTORY — PX: HIP PINNING,CANNULATED: SHX1758

## 2024-02-03 LAB — CBC
HCT: 33.9 % — ABNORMAL LOW (ref 36.0–46.0)
Hemoglobin: 11 g/dL — ABNORMAL LOW (ref 12.0–15.0)
MCH: 31.7 pg (ref 26.0–34.0)
MCHC: 32.4 g/dL (ref 30.0–36.0)
MCV: 97.7 fL (ref 80.0–100.0)
Platelets: 128 10*3/uL — ABNORMAL LOW (ref 150–400)
RBC: 3.47 MIL/uL — ABNORMAL LOW (ref 3.87–5.11)
RDW: 12.4 % (ref 11.5–15.5)
WBC: 7.9 10*3/uL (ref 4.0–10.5)
nRBC: 0 % (ref 0.0–0.2)

## 2024-02-03 LAB — SURGICAL PCR SCREEN
MRSA, PCR: NEGATIVE
Staphylococcus aureus: NEGATIVE

## 2024-02-03 LAB — BASIC METABOLIC PANEL WITH GFR
Anion gap: 8 (ref 5–15)
BUN: 15 mg/dL (ref 8–23)
CO2: 22 mmol/L (ref 22–32)
Calcium: 8.7 mg/dL — ABNORMAL LOW (ref 8.9–10.3)
Chloride: 106 mmol/L (ref 98–111)
Creatinine, Ser: 1.08 mg/dL — ABNORMAL HIGH (ref 0.44–1.00)
GFR, Estimated: 55 mL/min — ABNORMAL LOW (ref >60)
Glucose, Bld: 102 mg/dL — ABNORMAL HIGH (ref 70–99)
Potassium: 3.4 mmol/L — ABNORMAL LOW (ref 3.5–5.1)
Sodium: 136 mmol/L (ref 135–145)

## 2024-02-03 LAB — PHOSPHORUS: Phosphorus: 3 mg/dL (ref 2.5–4.6)

## 2024-02-03 LAB — MAGNESIUM: Magnesium: 2.3 mg/dL (ref 1.7–2.4)

## 2024-02-03 LAB — HIV ANTIBODY (ROUTINE TESTING W REFLEX): HIV Screen 4th Generation wRfx: NONREACTIVE

## 2024-02-03 LAB — VITAMIN D 25 HYDROXY (VIT D DEFICIENCY, FRACTURES): Vit D, 25-Hydroxy: 23.71 ng/mL — ABNORMAL LOW (ref 30–100)

## 2024-02-03 LAB — VITAMIN B12: Vitamin B-12: 578 pg/mL (ref 180–914)

## 2024-02-03 SURGERY — FIXATION, FEMUR, NECK, PERCUTANEOUS, USING SCREW
Anesthesia: General | Laterality: Left

## 2024-02-03 MED ORDER — 0.9 % SODIUM CHLORIDE (POUR BTL) OPTIME
TOPICAL | Status: DC | PRN
  Administered 2024-02-03: 1000 mL

## 2024-02-03 MED ORDER — CHLORHEXIDINE GLUCONATE 0.12 % MT SOLN
15.0000 mL | Freq: Once | OROMUCOSAL | Status: AC
  Administered 2024-02-03: 15 mL via OROMUCOSAL

## 2024-02-03 MED ORDER — ACETAMINOPHEN 10 MG/ML IV SOLN: 1000.0000 mg | Freq: Once | INTRAVENOUS | Status: DC | PRN

## 2024-02-03 MED ORDER — ONDANSETRON HCL 4 MG/2ML IJ SOLN
INTRAMUSCULAR | Status: DC | PRN
  Administered 2024-02-03: 4 mg via INTRAVENOUS

## 2024-02-03 MED ORDER — ORAL CARE MOUTH RINSE: 15.0000 mL | Freq: Once | OROMUCOSAL | Status: AC

## 2024-02-03 MED ORDER — ONDANSETRON 4 MG PO TBDP
4.0000 mg | ORAL_TABLET | Freq: Three times a day (TID) | ORAL | Status: DC | PRN
  Filled 2024-02-03: qty 1

## 2024-02-03 MED ORDER — SUCCINYLCHOLINE CHLORIDE 200 MG/10ML IV SOSY
PREFILLED_SYRINGE | INTRAVENOUS | Status: DC | PRN
  Administered 2024-02-03: 100 mg via INTRAVENOUS

## 2024-02-03 MED ORDER — LIDOCAINE HCL (PF) 2 % IJ SOLN
INTRAMUSCULAR | Status: AC
  Filled 2024-02-03: qty 5

## 2024-02-03 MED ORDER — CEFAZOLIN SODIUM-DEXTROSE 2-4 GM/100ML-% IV SOLN
2.0000 g | INTRAVENOUS | Status: AC
  Administered 2024-02-03: 2 g via INTRAVENOUS

## 2024-02-03 MED ORDER — PHENYLEPHRINE 80 MCG/ML (10ML) SYRINGE FOR IV PUSH (FOR BLOOD PRESSURE SUPPORT)
PREFILLED_SYRINGE | INTRAVENOUS | Status: DC | PRN
  Administered 2024-02-03 (×4): 160 ug via INTRAVENOUS

## 2024-02-03 MED ORDER — ONDANSETRON HCL 4 MG/2ML IJ SOLN
4.0000 mg | Freq: Four times a day (QID) | INTRAMUSCULAR | Status: DC
  Administered 2024-02-03 – 2024-02-04 (×3): 4 mg via INTRAVENOUS
  Filled 2024-02-03 (×6): qty 2

## 2024-02-03 MED ORDER — FENTANYL CITRATE (PF) 100 MCG/2ML IJ SOLN
INTRAMUSCULAR | Status: AC
Start: 2024-02-03 — End: 2024-02-03
  Filled 2024-02-03: qty 2

## 2024-02-03 MED ORDER — TRANEXAMIC ACID-NACL 1000-0.7 MG/100ML-% IV SOLN
INTRAVENOUS | Status: AC
  Filled 2024-02-03: qty 100

## 2024-02-03 MED ORDER — DEXMEDETOMIDINE HCL IN NACL 80 MCG/20ML IV SOLN
INTRAVENOUS | Status: AC
  Filled 2024-02-03: qty 20

## 2024-02-03 MED ORDER — LIDOCAINE HCL (CARDIAC) PF 100 MG/5ML IV SOSY
PREFILLED_SYRINGE | INTRAVENOUS | Status: DC | PRN
  Administered 2024-02-03: 60 mg via INTRAVENOUS

## 2024-02-03 MED ORDER — CEFAZOLIN SODIUM-DEXTROSE 2-4 GM/100ML-% IV SOLN
2.0000 g | Freq: Three times a day (TID) | INTRAVENOUS | Status: AC
  Administered 2024-02-03 – 2024-02-04 (×2): 2 g via INTRAVENOUS
  Filled 2024-02-03 (×2): qty 100

## 2024-02-03 MED ORDER — LACTATED RINGERS IV SOLN: INTRAVENOUS | Status: DC

## 2024-02-03 MED ORDER — POVIDONE-IODINE 10 % EX SWAB: 2.0000 | Freq: Once | CUTANEOUS | Status: DC

## 2024-02-03 MED ORDER — PROPOFOL 10 MG/ML IV BOLUS
INTRAVENOUS | Status: AC
  Filled 2024-02-03: qty 20

## 2024-02-03 MED ORDER — ISOPROPYL ALCOHOL 70 % SOLN
Status: AC
  Filled 2024-02-03: qty 480

## 2024-02-03 MED ORDER — PROPOFOL 10 MG/ML IV BOLUS
INTRAVENOUS | Status: DC | PRN
  Administered 2024-02-03: 140 mg via INTRAVENOUS

## 2024-02-03 MED ORDER — STERILE WATER FOR IRRIGATION IR SOLN
Status: DC | PRN
  Administered 2024-02-03: 1000 mL

## 2024-02-03 MED ORDER — PHENYLEPHRINE 80 MCG/ML (10ML) SYRINGE FOR IV PUSH (FOR BLOOD PRESSURE SUPPORT)
PREFILLED_SYRINGE | INTRAVENOUS | Status: AC
  Filled 2024-02-03: qty 10

## 2024-02-03 MED ORDER — BUPIVACAINE HCL (PF) 0.25 % IJ SOLN
INTRAMUSCULAR | Status: AC
  Filled 2024-02-03: qty 10

## 2024-02-03 MED ORDER — FENTANYL CITRATE (PF) 100 MCG/2ML IJ SOLN
INTRAMUSCULAR | Status: DC | PRN
  Administered 2024-02-03 (×2): 50 ug via INTRAVENOUS

## 2024-02-03 MED ORDER — TRANEXAMIC ACID-NACL 1000-0.7 MG/100ML-% IV SOLN
1000.0000 mg | INTRAVENOUS | Status: AC
  Administered 2024-02-03: 1000 mg via INTRAVENOUS

## 2024-02-03 MED ORDER — DEXMEDETOMIDINE HCL IN NACL 80 MCG/20ML IV SOLN
INTRAVENOUS | Status: DC | PRN
  Administered 2024-02-03: 8 ug via INTRAVENOUS
  Administered 2024-02-03: 4 ug via INTRAVENOUS

## 2024-02-03 MED ORDER — CHLORHEXIDINE GLUCONATE 4 % EX SOLN: 60.0000 mL | Freq: Once | CUTANEOUS | Status: DC

## 2024-02-03 MED ORDER — CEFAZOLIN SODIUM-DEXTROSE 2-4 GM/100ML-% IV SOLN
INTRAVENOUS | Status: AC
  Filled 2024-02-03: qty 100

## 2024-02-03 MED ORDER — BUPIVACAINE HCL (PF) 0.25 % IJ SOLN
INTRAMUSCULAR | Status: DC | PRN
Start: 2024-02-03 — End: 2024-02-03
  Administered 2024-02-03: 20 mL

## 2024-02-03 MED ORDER — ONDANSETRON HCL 4 MG/2ML IJ SOLN
INTRAMUSCULAR | Status: AC
Start: 2024-02-03 — End: 2024-02-03
  Filled 2024-02-03: qty 2

## 2024-02-03 MED ORDER — DEXAMETHASONE SODIUM PHOSPHATE 10 MG/ML IJ SOLN
INTRAMUSCULAR | Status: AC
  Filled 2024-02-03: qty 1

## 2024-02-03 MED ORDER — BUPIVACAINE HCL (PF) 0.25 % IJ SOLN
INTRAMUSCULAR | Status: AC
  Filled 2024-02-03: qty 20

## 2024-02-03 MED ORDER — POTASSIUM CHLORIDE CRYS ER 20 MEQ PO TBCR
40.0000 meq | EXTENDED_RELEASE_TABLET | Freq: Once | ORAL | Status: DC
  Filled 2024-02-03: qty 2

## 2024-02-03 MED ORDER — FENTANYL CITRATE PF 50 MCG/ML IJ SOSY: 25.0000 ug | PREFILLED_SYRINGE | INTRAMUSCULAR | Status: DC | PRN

## 2024-02-03 MED ORDER — ASPIRIN 81 MG PO TBEC
81.0000 mg | DELAYED_RELEASE_TABLET | Freq: Two times a day (BID) | ORAL | Status: DC
  Administered 2024-02-04 – 2024-02-12 (×16): 81 mg via ORAL
  Filled 2024-02-03 (×18): qty 1

## 2024-02-03 MED ORDER — DEXAMETHASONE SODIUM PHOSPHATE 10 MG/ML IJ SOLN
INTRAMUSCULAR | Status: DC | PRN
  Administered 2024-02-03: 8 mg via INTRAVENOUS

## 2024-02-03 SURGICAL SUPPLY — 55 items
BIT DRILL 4.8X200 CANN (BIT) IMPLANT
BLADE SURG 15 STRL LF DISP TIS (BLADE) ×1 IMPLANT
BNDG COHESIVE 4X5 TAN STRL LF (GAUZE/BANDAGES/DRESSINGS) IMPLANT
BNDG ELASTIC 2INX 5YD STR LF (GAUZE/BANDAGES/DRESSINGS) IMPLANT
BNDG ELASTIC 3INX 5YD STR LF (GAUZE/BANDAGES/DRESSINGS) IMPLANT
BNDG ELASTIC 4INX 5YD STR LF (GAUZE/BANDAGES/DRESSINGS) ×1 IMPLANT
BNDG ESMARK 4X9 LF (GAUZE/BANDAGES/DRESSINGS) IMPLANT
BRUSH SCRUB EZ 4% CHG (MISCELLANEOUS) IMPLANT
CHLORAPREP W/TINT 26 (MISCELLANEOUS) IMPLANT
COVER BACK TABLE 60X90IN (DRAPES) ×1 IMPLANT
COVER SURGICAL LIGHT HANDLE (MISCELLANEOUS) IMPLANT
DERMABOND ADVANCED .7 DNX12 (GAUZE/BANDAGES/DRESSINGS) IMPLANT
DRAPE C-ARMOR (DRAPES) IMPLANT
DRAPE EXTREMITY T 121X128X90 (DISPOSABLE) IMPLANT
DRAPE OEC MINIVIEW 54X84 (DRAPES) ×1 IMPLANT
DRAPE U-SHAPE 47X51 STRL (DRAPES) IMPLANT
DRSG AQUACEL AG ADV 3.5X 4 (GAUZE/BANDAGES/DRESSINGS) IMPLANT
DURAPREP 26ML APPLICATOR (WOUND CARE) ×1 IMPLANT
GAUZE SPONGE 4X4 12PLY STRL (GAUZE/BANDAGES/DRESSINGS) ×1 IMPLANT
GAUZE XEROFORM 1X8 LF (GAUZE/BANDAGES/DRESSINGS) IMPLANT
GLOVE BIO SURGEON STRL SZ 6.5 (GLOVE) IMPLANT
GLOVE BIO SURGEON STRL SZ7.5 (GLOVE) ×1 IMPLANT
GLOVE BIOGEL PI IND STRL 6.5 (GLOVE) IMPLANT
GLOVE BIOGEL PI IND STRL 7.5 (GLOVE) ×1 IMPLANT
GLOVE BIOGEL PI IND STRL 8 (GLOVE) ×2 IMPLANT
GLOVE SURG ORTHO 8.0 STRL STRW (GLOVE) ×1 IMPLANT
GOWN STRL REUS W/ TWL LRG LVL3 (GOWN DISPOSABLE) ×1 IMPLANT
GOWN STRL REUS W/ TWL XL LVL3 (GOWN DISPOSABLE) ×1 IMPLANT
KIT BASIN OR (CUSTOM PROCEDURE TRAY) ×1 IMPLANT
KIT TURNOVER KIT A (KITS) IMPLANT
MANIFOLD NEPTUNE II (INSTRUMENTS) IMPLANT
NDL HYPO 25X1 1.5 SAFETY (NEEDLE) IMPLANT
NEEDLE HYPO 25X1 1.5 SAFETY (NEEDLE) IMPLANT
NS IRRIG 1000ML POUR BTL (IV SOLUTION) IMPLANT
PACK GENERAL/GYN (CUSTOM PROCEDURE TRAY) IMPLANT
PAD ARMBOARD POSITIONER FOAM (MISCELLANEOUS) IMPLANT
PAD CAST 3X4 CTTN HI CHSV (CAST SUPPLIES) IMPLANT
PAD CAST 4YDX4 CTTN HI CHSV (CAST SUPPLIES) ×1 IMPLANT
PADDING CAST ABS COTTON 4X4 ST (CAST SUPPLIES) ×1 IMPLANT
PADDING UNDERCAST 2X4 STRL (CAST SUPPLIES) IMPLANT
PIN GUIDE DRIL TIP 2.8X300 STE (PIN) IMPLANT
SCREW 16MM THREAD 6.5X85MM (Screw) IMPLANT
SCREW CANN 6.5X90 16MM THD (Screw) IMPLANT
SCREW CANN THRD 6.5X80 (Screw) IMPLANT
SPLINT PLASTER CAST XFAST 4X15 (CAST SUPPLIES) IMPLANT
SPLINT PLASTER CAST XFAST 5X30 (CAST SUPPLIES) IMPLANT
STOCKINETTE 4X48 STRL (DRAPES) ×1 IMPLANT
SUT MNCRL AB 3-0 PS2 18 (SUTURE) IMPLANT
SUT VIC AB 0 CT1 27XBRD ANTBC (SUTURE) IMPLANT
SUT VIC AB 2-0 CT1 TAPERPNT 27 (SUTURE) IMPLANT
SYR CONTROL 10ML LL (SYRINGE) IMPLANT
TOWEL GREEN STERILE FF (TOWEL DISPOSABLE) ×1 IMPLANT
UNDERPAD 30X36 HEAVY ABSORB (UNDERPADS AND DIAPERS) ×1 IMPLANT
WASHER FLAT 6.5MM (Washer) IMPLANT
WATER STERILE IRR 1000ML POUR (IV SOLUTION) IMPLANT

## 2024-02-03 NOTE — Anesthesia Procedure Notes (Signed)
 Procedure Name: Intubation Date/Time: 02/03/2024 9:33 AM  Performed by: Vella Gey, CRNAPre-anesthesia Checklist: Patient identified, Emergency Drugs available, Suction available and Patient being monitored Patient Re-evaluated:Patient Re-evaluated prior to induction Oxygen Delivery Method: Circle system utilized Preoxygenation: Pre-oxygenation with 100% oxygen Induction Type: IV induction Ventilation: Mask ventilation without difficulty Laryngoscope Size: Miller and 2 Grade View: Grade I Tube type: Oral Tube size: 7.5 mm Number of attempts: 1 Airway Equipment and Method: Stylet and Oral airway Placement Confirmation: ETT inserted through vocal cords under direct vision, positive ETCO2 and breath sounds checked- equal and bilateral Secured at: 21 cm Tube secured with: Tape Dental Injury: Teeth and Oropharynx as per pre-operative assessment

## 2024-02-03 NOTE — Telephone Encounter (Signed)
 Pt currently admitted to the hospital w/ femur fx, more than likely going to SNF at discharge. Will refill at time of discharge.

## 2024-02-03 NOTE — Telephone Encounter (Signed)
 Copied from CRM [id number]. Topic: Clinical - Prescription Issue >> Feb 03, 2024  2:36 PM Howard Macho wrote: Reason for CRM: patient POA called stating the patient was suppose to see the doctor but she broke her hip on Monday afternoon so she could not come to her appointment. Patient POA want to know if the doctor can refill the patient citalopram  for her. She want the medication sent to [facility] CB [phone]

## 2024-02-03 NOTE — Op Note (Signed)
 02/03/2024  10:30 AM  PATIENT:  [name] [name]    PRE-OPERATIVE DIAGNOSIS: Left cannulated valgus impacted femoral neck fracture  POST-OPERATIVE DIAGNOSIS:  Same  PROCEDURE: Cannulated screw fixation valgus impacted femoral neck fracture  SURGEON:  Adelis Docter A Exzavier Ruderman, MD  PHYSICIAN ASSISTANT: Mason Sole, PA-C was present and scrubbed throughout the case, critical for completion in a timely fashion, and for retraction, instrumentation, and closure.  ANESTHESIA:   General  ESTIMATED BLOOD LOSS: 25cc.  PREOPERATIVE INDICATIONS:  [name] [name] is a  73 y.o. female who fell and was found to have a diagnosis of Left hip fracture who elected for surgical management.    The risks benefits and alternatives were discussed with the patient preoperatively including but not limited to the risks of infection, bleeding, nerve injury, cardiopulmonary complications, blood clots, malunion, nonunion, avascular necrosis, the need for revision surgery, the potential for conversion to hemiarthroplasty, among others, and the patient was willing to proceed.  OPERATIVE IMPLANTS: 6.5 mm partially threaded cannulated screws x3, 1 washer on the superior posterior screw  OPERATIVE FINDINGS: Clinical osteoporosis with weak bone, proximal femur  OPERATIVE PROCEDURE: The patient was brought to the operating room and placed in supine position. IV antibiotics were given. General anesthesia administered. Foley was also given. The patient was placed on the fracture table. The operative extremity was positioned, without any significant reduction maneuver and was prepped and draped in usual sterile fashion.  Time out was performed.  Small incision was made distal to the greater trochanter, and 3 guidewires were introduced Into an inverted triangle configuration. The lengths were measured. The reduction was slightly valgus, and near-anatomic. I opened the cortex with a cannulated drill, and then  placed the screws into position. Satisfactory fixation was achieved.  The wounds were irrigated copiously, and repaired with 2-0 Vicryl and 3-0 Monocryl.  20 cc of quarter percent Marcaine  were injected into the wound.  Dermabond and Aquacel dressing was applied to the wound.  There no complications and the patient tolerated the procedure well.  Post op recs: WB: WBAT no precautions Abx: ancef  x23 hours post op Imaging: PACU xrays Dressing: keep intact until follow up, change PRN if soiled or saturated. DVT prophylaxis: ASA 81 mg twice daily x 4 weeks Follow up: 2 weeks after surgery for a wound check with Dr. Pryor Browning at [facility].  Address: [address], [place], [place] [address]  Office Phone: [phone]   Priscille Brought, MD

## 2024-02-03 NOTE — Discharge Instructions (Signed)
 Orthopedic Discharge Instructions  Diet: As you were doing prior to hospitalization   Shower:  May shower but keep the wounds dry, use an occlusive plastic wrap, NO SOAKING IN TUB.  If the bandage gets wet, change with a clean dry gauze.  If you have a splint on, leave the splint in place and keep the splint dry with a plastic bag.  Dressing:  You may change your dressing 3-5 days after surgery.  If the dressing remains clean and dry it can also be left on until follow up.  If you change the dressing replace with clean gauze and tape or ace wrap.  For most surgeries, the stitches used are dissolvable and don't need to be removed.  However, depending on your surgery, you may have stitches that will need to be removed in the office in about 2 weeks.    Activity:  Increase activity slowly as tolerated, but follow the weight bearing instructions below.  Do not drive for the next 4-6 weeks.  In addition, you cannot be taking narcotics while you drive, and you must feel in control of the vehicle.    Weight Bearing:   You may bear weight on your surgical leg as tolerated.    Blood clot prevention (DVT Prophylaxis): After surgery you are at an increased risk for a blood clot. you were prescribed a blood thinner, Aspirin 81 mg, to be taken twice daily for a total of 4 weeks from surgery to help reduce your risk of getting a blood clot. Signs of a pulmonary embolus (blood clot in the lungs) include sudden short of breath, feeling lightheaded or dizzy, chest pain with a deep breath, rapid pulse rapid breathing.  Signs of a blood clot in your arms or legs include new unexplained swelling and cramping, warm, red or darkened skin around the painful area.  Please call the office or 911 right away if these signs or symptoms develop.  To prevent constipation: you may use a stool softener such as - Colace (over the counter) 100 mg by mouth twice a day  Drink plenty of fluids (prune juice may be helpful) and high fiber  foods Miralax (over the counter) for constipation as needed.    Itching:  If you experience itching with your medications, try taking only a single pain pill, or even half a pain pill at a time.  You may take up to 10 pain pills per day, and you can also use benadryl over the counter for itching or also to help with sleep.   Precautions:  If you experience chest pain or shortness of breath - call 911 immediately for transfer to the hospital emergency department!!  Call office [phone]) for the following: Temperature greater than 101F Persistent nausea and vomiting Severe uncontrolled pain Redness, tenderness, or signs of infection (pain, swelling, redness, odor or green/yellow discharge around the site) Difficulty breathing, headache or visual disturbances Hives Persistent dizziness or light-headedness Extreme fatigue Any other questions or concerns you may have after discharge  In an emergency, call 911 or go to an Emergency Department at a nearby hospital  Follow- Up Appointment:  Please call for an appointment to be seen approximately 2-3 week after surgery in [facility] with your surgeon Dr. Weber Cooks - [phone] Address: [address], [place], [place] [address]

## 2024-02-03 NOTE — Anesthesia Postprocedure Evaluation (Signed)
 Anesthesia Post Note  Patient: [name] [name]  Procedure(s) Performed: PINNING, EXTREMITY, PERCUTANEOUS (Left)     Patient location during evaluation: PACU Anesthesia Type: General Level of consciousness: awake and alert Pain management: pain level controlled Vital Signs Assessment: post-procedure vital signs reviewed and stable Respiratory status: spontaneous breathing, nonlabored ventilation, respiratory function stable and patient connected to nasal cannula oxygen Cardiovascular status: blood pressure returned to baseline and stable Postop Assessment: no apparent nausea or vomiting Anesthetic complications: no   No notable events documented.  Last Vitals:  Vitals:   02/03/24 1215 02/03/24 1240  BP: (!) 158/97 (!) 168/89  Pulse: (!) 102 (!) 105  Resp: 14 16  Temp:  36.8 C  SpO2: 97% 90%    Last Pain:  Vitals:   02/03/24 1240  TempSrc: Oral  PainSc: 0-No pain                 Theotis Flake P Michaeleen Down

## 2024-02-03 NOTE — Progress Notes (Signed)
 PROGRESS NOTE    [name] [name]  ZOX:[id number] DOB: 12/07/1950 DOA: 02/02/2024 PCP: [name], MD    Brief Narrative:73 y.o. female with hx of limited SCLC s/p chemo, radiation, prophylactic cranial radiation, CAD by imaging, mild cognitive impairment, alcohol use disorder, CKD3a, HTN, HLD,  B12 deficiency, osteoporosis with prior vertebral fracture, who presents after ground level fall. Reports mechanical fall trying to get into bed, got tripped up on her dog, this occurred yesterday evening. Today with worsening pain and inability to transfer prompting ED visit. She has chronic numbness in her feet, no new numbness or weakness. Reports L hip pain is the worst but also c/o pain in the R hip. Denies hitting her head, is not on anticoagulants. No other areas of injury. No syncope. Otherwise reports 1 day of N/V/D and limited POs. Reports ongoing heavy alcohol use with 1-2 bottles of wine daily     Assessment & Plan:   Principal Problem:   Fracture of femoral neck, left, closed (HCC) Active Problems:   Cognitive impairment   Alcohol use disorder   Dehydration   Left femoral neck fracture Ground-level fall, mechanical  Osteoporosis-she is status post cannulated screw fixation of the valgus impacted femoral neck fracture 02/03/2024. 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 Has seen Dr. Liam Redhead and no evidence of recurrent disease. On surveillance outpatient.   CAD by imaging/HLD: Holding aspirin . On repatha   outpatient. Continue rosuvastatin .   mild cognitive impairment: stable  CKD3a: Baseline Cr ~ 0.8 - 1.   HTN: Not on antihypertensives. Bp 153/90   B12 deficiency:  b 12 578  Hypokalemia potassium 3.4 replete mag 2.3    Estimated body mass index is 23.17 kg/m as calculated from the following:   Height as of this encounter: 5' 7 (1.702 m).   Weight as of this encounter: 67.1 kg.  DVT prophylaxis: aspirin  Code Status:full Family Communication: Caregiver at bedside Disposition Plan:  Status is: Inpatient Remains inpatient appropriate because: Acute illness   Consultants:  Ortho  Procedures: Left hip screw fixation Antimicrobials: None Subjective: Sitting in bed saw her prior to surgery denies any pain  Objective: Vitals:   02/03/24 1200 02/03/24 1215 02/03/24 1240 02/03/24 1452  BP: (!) 150/91 (!) 158/97 (!) 168/89 (!) 153/90  Pulse: (!) 102 (!) 102 (!) 105 (!) 103  Resp: 17 14 16 16   Temp:   98.3 F (36.8 C) 98.8 F (37.1 C)  TempSrc:   Oral Oral  SpO2: 95% 97% 90% 98%  Weight:      Height:        Intake/Output Summary (Last 24 hours) at 02/03/2024 1543 Last data filed at 02/03/2024 1045 Gross per 24 hour  Intake 2248.84 ml  Output 25 ml  Net 2223.84 ml   Filed Weights   02/02/24 1907 02/03/24 0002  Weight: 68 kg 67.1 kg    Examination:  General exam: Appears in no acute distress Respiratory system: Clear to auscultation. Respiratory effort normal. Cardiovascular system: S1 & S2 heard, RRR. No JVD, murmurs, rubs, gallops or clicks.  No pedal edema. Gastrointestinal system: Abdomen is nondistended, soft and nontender. No organomegaly or masses felt. Normal bowel sounds heard. Central nervous system: Awake oriented to hospital extremities: Left lower extremity edema    Data Reviewed: I have personally reviewed following labs and imaging studies  CBC: Recent Labs  Lab 02/02/24 1735 02/03/24 0329  WBC 10.4 7.9  NEUTROABS 9.1*  --   HGB 11.9* 11.0*   HCT 36.7 33.9*  MCV 96.3 97.7  PLT 136* 128*   Basic Metabolic Panel: Recent Labs  Lab 02/02/24 1735 02/03/24 0329  NA 134* 136  K 3.9 3.4*  CL 102 106  CO2 21* 22  GLUCOSE 117* 102*  BUN 16 15  CREATININE 1.05* 1.08*  CALCIUM  9.2 8.7*  MG  --  2.3  PHOS  --  3.0   GFR: Estimated Creatinine Clearance: 47.1 mL/min (A) (by C-G formula based on SCr of 1.08 mg/dL (H)). Liver Function Tests: No results for input(s): AST, ALT, ALKPHOS, BILITOT, PROT, ALBUMIN in the last 168 hours. No results for input(s): LIPASE, AMYLASE in the last 168 hours. No results for input(s): AMMONIA in the last 168 hours. Coagulation Profile: Recent Labs  Lab 02/02/24 1735  INR 1.0   Cardiac Enzymes: No results for input(s): CKTOTAL, CKMB, CKMBINDEX, TROPONINI in the last 168 hours. BNP (last 3 results) No results for input(s): PROBNP in the last 8760 hours. HbA1C: No results for input(s): HGBA1C in the last 72 hours. CBG: No results for input(s): GLUCAP in the last 168 hours. Lipid Profile: No results for input(s): CHOL, HDL, LDLCALC, TRIG, CHOLHDL, LDLDIRECT in the last 72 hours. Thyroid  Function Tests: No results for input(s): TSH, T4TOTAL, FREET4, T3FREE, THYROIDAB in the last 72 hours. Anemia Panel: Recent Labs    02/03/24 0329  VITAMINB12 578   Sepsis Labs: No results for input(s): PROCALCITON, LATICACIDVEN in the last 168 hours.  Recent Results (from the past 240 hours)  Surgical PCR screen     Status: None   Collection Time: 02/03/24  2:44 AM   Specimen: Nasal Mucosa; Nasal Swab  Result Value Ref Range Status   MRSA, PCR NEGATIVE NEGATIVE Final   Staphylococcus aureus NEGATIVE NEGATIVE Final    Comment: (NOTE) The Xpert SA Assay (FDA approved for NASAL specimens in patients 74 years of age and older), is one component of a comprehensive surveillance program. It is not intended to diagnose infection nor to guide or  monitor treatment. Performed at [facility], [address], [place], [place] [address]          Radiology Studies: DG FEMUR PORT MIN 2 VIEWS LEFT Result Date: 02/03/2024 CLINICAL DATA:  Postoperative state left femur. EXAM: LEFT FEMUR PORTABLE 2 VIEWS COMPARISON:  CT left hip 02/02/2024 FINDINGS: New ORIF of the previously seen subcapital proximal left femoral fracture with spell 3 longitudinal screws. Minimal inferior displacement of the distal fracture component with respect to the proximal fracture component is similar to prior. No knee joint effusion. Minimal chronic enthesopathic change at the quadriceps insertion on the patella. IMPRESSION: New ORIF of the previously seen subcapital proximal left femoral fracture. Electronically Signed   By: Bertina Broccoli M.D.   On: 02/03/2024 13:46   DG HIP UNILAT WITH PELVIS 2-3 VIEWS LEFT Result Date: 02/03/2024 CLINICAL DATA:  Elective surgery.  Intraoperative left hip pinning. EXAM: DG HIP (WITH OR WITHOUT PELVIS) 2-3V LEFT COMPARISON:  Pelvis and left hip radiographs 02/02/2024, CT left hip 02/02/2024 FINDINGS: Images were performed intraoperatively without  the presence of a radiologist. 3 longitudinal screw fixation of the proximal left femoral head and neck, fixating the previously seen subcapital fracture. Total fluoroscopy images: 9 Total fluoroscopy time: 54 seconds Total dose: Radiation Exposure Index (as provided by the fluoroscopic device): 7.9 mGy air Kerma Please see intraoperative findings for further detail. IMPRESSION: Intraoperative fluoroscopy for left hip pinning. Electronically Signed   By: Bertina Broccoli M.D.   On: 02/03/2024 12:10   DG C-Arm 1-60 Min-No Report Result Date: 02/03/2024 Fluoroscopy was utilized by the requesting physician.  No radiographic interpretation.   DG HIP UNILAT WITH PELVIS 2-3 VIEWS RIGHT Result Date: 02/02/2024 CLINICAL DATA:  Right-sided hip pain EXAM: DG HIP (WITH OR WITHOUT PELVIS)  2-V RIGHT COMPARISON:  None Available. FINDINGS: Pelvic ring is intact. No acute fracture or dislocation is noted. No soft tissue abnormality is seen. IMPRESSION: No acute abnormality noted. Electronically Signed   By: Violeta Grey M.D.   On: 02/02/2024 23:24   DG Knee Right Port Result Date: 02/02/2024 CLINICAL DATA:  Fall yesterday with right knee pain, initial encounter EXAM: PORTABLE RIGHT KNEE - 2 VIEW COMPARISON:  None Available. FINDINGS: No evidence of fracture, dislocation, or joint effusion. No evidence of arthropathy or other focal bone abnormality. Soft tissues are unremarkable. IMPRESSION: No acute abnormality noted. Electronically Signed   By: Violeta Grey M.D.   On: 02/02/2024 23:19   CT Hip Left Wo Contrast Result Date: 02/02/2024 CLINICAL DATA:  Hip trauma, fracture suspected EXAM: CT OF THE LEFT HIP WITHOUT CONTRAST TECHNIQUE: Multidetector CT imaging of the left hip was performed according to the standard protocol. Multiplanar CT image reconstructions were also generated. RADIATION DOSE REDUCTION: This exam was performed according to the departmental dose-optimization program which includes automated exposure control, adjustment of the mA and/or kV according to patient size and/or use of iterative reconstruction technique. COMPARISON:  Same day hip radiograph FINDINGS: Bones/Joint/Cartilage Nondisplaced mildly impacted subcapital femoral neck fracture of the left femur. Remote pubic rami fractures on the left. Ligaments Suboptimally assessed by CT. Muscles and Tendons Grossly intact. Soft tissues Unremarkable. IMPRESSION: Nondisplaced mildly impacted subcapital femoral neck fracture of the left femur. Electronically Signed   By: Rozell Cornet M.D.   On: 02/02/2024 22:08   DG Hip Unilat With Pelvis 2-3 Views Left Result Date: 02/02/2024 CLINICAL DATA:  Marvell Slider 1 day ago EXAM: DG HIP (WITH OR WITHOUT PELVIS) 2-3V LEFT COMPARISON:  None Available. FINDINGS: Curvilinear lucency with  surrounding sclerosis about the subcapital left femoral neck suspicious for nondisplaced fracture. No dislocation. Remote fractures of the left pubic rami. Degenerative changes pubic symphysis, both hips, SI joints and lower lumbar spine. IMPRESSION: Findings suspicious for nondisplaced subcapital left femoral neck fracture. CT recommended for further evaluation. Electronically Signed   By: Rozell Cornet M.D.   On: 02/02/2024 20:16   DG Chest Port 1 View Result Date: 02/02/2024 CLINICAL DATA:  Fall EXAM: PORTABLE CHEST 1 VIEW COMPARISON:  07/30/2023, CT chest 01/21/2024, 08/25/2021 FINDINGS: Distortion and bandlike fibrosis and scarring in the right hilar/suprahilar region. No acute airspace disease. Stable cardiomediastinal silhouette. No pneumothorax IMPRESSION: No active disease. Stable distortion and bandlike fibrosis and scarring in the right hilar/suprahilar region. Electronically Signed   By: Esmeralda Hedge M.D.   On: 02/02/2024 20:15        Scheduled Meds:  [START ON 02/04/2024] aspirin  EC  81 mg Oral BID   cholecalciferol   1,000 Units Oral Daily   folic acid   1 mg Oral Daily  LORazepam   0-4 mg Intravenous Q6H   Or   LORazepam   0-4 mg Oral Q6H   [START ON 02/05/2024] LORazepam   0-4 mg Intravenous Q12H   Or   [START ON 02/05/2024] LORazepam   0-4 mg Oral Q12H   multivitamin with minerals  1 tablet Oral Daily   ondansetron  (ZOFRAN ) IV  4 mg Intravenous Q6H   potassium chloride   40 mEq Oral Once   sodium chloride  flush  3 mL Intravenous Q12H   thiamine   100 mg Oral Daily   Or   thiamine   100 mg Intravenous Daily   thiamine   100 mg Oral Daily   Or   thiamine   100 mg Intravenous Daily   Continuous Infusions:  sodium chloride  75 mL/hr at 02/03/24 1233    ceFAZolin  (ANCEF ) IV       LOS: 1 day    Time spent: 50 min   Barbee Lew, MD  02/03/2024, 3:43 PM

## 2024-02-03 NOTE — Plan of Care (Signed)

## 2024-02-03 NOTE — Telephone Encounter (Signed)
 See other phone note, daughter states that she overstepped and the patient does not want to be dismissed. Unfortunately, now she has been admitted to the hospital with a fracture. Plan:  Will see her after the hospital admission, will take it from there.

## 2024-02-03 NOTE — Anesthesia Preprocedure Evaluation (Addendum)
 Anesthesia Evaluation  Patient identified by MRN, date of birth, ID band Patient awake    Reviewed: Allergy & Precautions, NPO status , Patient's Chart, lab work & pertinent test results  Airway Mallampati: II  TM Distance: >3 FB Neck ROM: Full    Dental no notable dental hx.    Pulmonary COPD, former smoker   Pulmonary exam normal        Cardiovascular hypertension, Pt. on medications and Pt. on home beta blockers + CAD   Rhythm:Regular Rate:Normal     Neuro/Psych  Headaches  Anxiety Depression       GI/Hepatic negative GI ROS, Neg liver ROS,,,  Endo/Other  negative endocrine ROS    Renal/GU negative Renal ROS  negative genitourinary   Musculoskeletal  (+) Arthritis , Osteoarthritis,    Abdominal Normal abdominal exam  (+)   Peds  Hematology  (+) Blood dyscrasia, anemia Lab Results      Component                Value               Date                      WBC                      7.9                 02/03/2024                HGB                      11.0 (L)            02/03/2024                HCT                      33.9 (L)            02/03/2024                MCV                      97.7                02/03/2024                PLT                      128 (L)             02/03/2024              Anesthesia Other Findings   Reproductive/Obstetrics                             Anesthesia Physical Anesthesia Plan  ASA: 3  Anesthesia Plan: General   Post-op Pain Management:    Induction: Intravenous  PONV Risk Score and Plan: 3 and Ondansetron , Dexamethasone  and Treatment may vary due to age or medical condition  Airway Management Planned: Mask and Oral ETT  Additional Equipment: None  Intra-op Plan:   Post-operative Plan: Extubation in OR  Informed Consent: I have reviewed the patients History and Physical, chart, labs and discussed the procedure including the risks,  benefits and alternatives for the proposed anesthesia  with the patient or authorized representative who has indicated his/her understanding and acceptance.     Dental advisory given and Consent reviewed with POA  Plan Discussed with: CRNA  Anesthesia Plan Comments:        Anesthesia Quick Evaluation

## 2024-02-03 NOTE — Transfer of Care (Signed)
 Immediate Anesthesia Transfer of Care Note  Patient: [name] [name]  Procedure(s) Performed: PINNING, EXTREMITY, PERCUTANEOUS (Left)  Patient Location: PACU  Anesthesia Type:General  Level of Consciousness: drowsy  Airway & Oxygen Therapy: Patient Spontanous Breathing and Patient connected to face mask oxygen  Post-op Assessment: Report given to RN and Post -op Vital signs reviewed and stable  Post vital signs: Reviewed and stable  Last Vitals:  Vitals Value Taken Time  BP 123/75 02/03/24 1043  Temp    Pulse 94 02/03/24 1045  Resp 19 02/03/24 1045  SpO2 100 % 02/03/24 1045  Vitals shown include unfiled device data.  Last Pain:  Vitals:   02/03/24 0900  TempSrc:   PainSc: 4       Patients Stated Pain Goal: 3 (02/03/24 0557)  Complications: No notable events documented.

## 2024-02-03 NOTE — Plan of Care (Signed)
  Problem: Education: Goal: Knowledge of General Education information will improve Description: Including pain rating scale, medication(s)/side effects and non-pharmacologic comfort measures Outcome: Progressing   Problem: Clinical Measurements: Goal: Ability to maintain clinical measurements within normal limits will improve Outcome: Progressing   Problem: Safety: Goal: Ability to remain free from injury will improve Outcome: Progressing   Problem: Education: Goal: Verbalization of understanding the information provided (i.e., activity precautions, restrictions, etc) will improve Outcome: Progressing   Problem: Pain Management: Goal: Pain level will decrease Outcome: Progressing

## 2024-02-03 NOTE — Progress Notes (Signed)
 OT Cancellation Note  Patient Details Name: [name] [name] MRN: [id number] DOB: 1951/06/13   Cancelled Treatment:    Reason Eval/Treat Not Completed: Medical issues which prohibited therapy Patient was admitted with femoral neck fracture with pending surgical intervention with Dr.Wham. OT to continue to follow and check back post op for updated orders.  Wynette Heckler, MS Acute Rehabilitation Department Office# [phone]  02/03/2024, 7:16 AM

## 2024-02-03 NOTE — Consult Note (Signed)
 ORTHOPAEDIC CONSULTATION  REQUESTING PHYSICIAN: Barbee Lew, MD  Chief Complaint: Left hip injury after fall  HPI: [name] [name] is a 73 y.o. female who sustained fracture of her left hip after a fall out of bed yesterday.  She localizes pain to the left hip area.  She denies pain in other joints or extremities.  She denies distal numbness and tingling.  She does have a history of cognitive decline.  Her sister is her medical power of attorney.  I spoke with the patient and her sister on the phone.  Patient sitter is also at bedside.  Also reported that patient has an alcohol use disorder.  Past Medical History:  Diagnosis Date   Anemia    duering cancer treatment    Anxiety    Arthritis    OA hands    Bilateral lower extremity pain    Blood transfusion without reported diagnosis    during chemo for lung cancer   Cataract    forming  but very small    Cholelithiasis    Colon polyps    adenomatous   Concussion 08/25/2021   ETOHdependence (HCC)    High cholesterol    History of chemotherapy    for lung cancer- completed 2013   Hx of radiation therapy 10/07/11 to 11/20/11   right lung   Hx of radiation therapy 01/07/2012- 01/21/12   cranial irradiation   Hypertension    Lung cancer (HCC) 09/19/2011   Stage III currently in remission   Malignant melanoma of skin of canthus of right eye (HCC) 02/2017   Osteoporosis 04/2014   Dexa scan by Dr. Asencion Blacksmith, T-score -2.7, next in 2 years, with at least 13 % major fracture in 10 years   Pitting edema    Bilateral lower extremities, duplex ultrasound 04/03/2015 normal, no DVT   Past Surgical History:  Procedure Laterality Date   CHOLECYSTECTOMY  04/2011   biliary stent placement   CLOSED REDUCTION FINGER WITH PERCUTANEOUS PINNING Left 10/23/2021   Procedure: LEFT INDEX, MIDDLE, RING, AND SMALL FINGER CLOSED REDUCTION FINGER WITH PERCUTANEOUS PINNING;  Surgeon: Marilyn Shropshire, MD;  Location: [facility];   Service: Orthopedics;  Laterality: Left;   COLONOSCOPY     INCISIONAL HERNIA REPAIR  2015   IR KYPHO LUMBAR INC FX REDUCE BONE BX UNI/BIL CANNULATION INC/IMAGING  09/18/2022   MOHS SURGERY Right 02/2017   cheek and eyelid   T[place]i Fat Touch     Vaginal procedure   POLYPECTOMY     TUBAL LIGATION     Social History   Socioeconomic History   Marital status: Divorced    Spouse name: Not on file   Number of children: 2   Years of education: Not on file   Highest education level: Not on file  Occupational History   Occupation: retired, Engineering geologist  Tobacco Use   Smoking status: Former    Current packs/day: 0.00    Types: Cigarettes    Quit date: 09/08/2011    Years since quitting: 12.4   Smokeless tobacco: Never  Vaping Use   Vaping status: Never Used  Substance and Sexual Activity   Alcohol use: Yes    Comment: daily bottle of wine   Drug use: No   Sexual activity: Not on file  Other Topics Concern   Not on file  Social History Narrative   Divorced   Lost 1 child June 2019 (drugs)   Living child w/ drug addiction   Lives by herself  Social Drivers of Corporate investment banker Strain: Low Risk  (04/22/2023)   Overall Financial Resource Strain (CARDIA)    Difficulty of Paying Living Expenses: Not hard at all  Food Insecurity: No Food Insecurity (02/02/2024)   Hunger Vital Sign    Worried About Running Out of Food in the Last Year: Never true    Ran Out of Food in the Last Year: Never true  Transportation Needs: No Transportation Needs (02/02/2024)   PRAPARE - Administrator, Civil Service (Medical): No    Lack of Transportation (Non-Medical): No  Physical Activity: Inactive (04/22/2023)   Exercise Vital Sign    Days of Exercise per Week: 0 days    Minutes of Exercise per Session: 0 min  Stress: No Stress Concern Present (04/22/2023)   [employer] of Occupational Health - Occupational Stress Questionnaire    Feeling of Stress : Not at all  Social  Connections: Socially Isolated (02/02/2024)   Social Connection and Isolation Panel [NHANES]    Frequency of Communication with Friends and Family: More than three times a week    Frequency of Social Gatherings with Friends and Family: Once a week    Attends Religious Services: Never    Database administrator or Organizations: No    Attends Engineer, structural: Never    Marital Status: Divorced   Family History  Problem Relation Age of Onset   Bladder Cancer Father    Stroke Father    Colon polyps Father    Atrial fibrillation Mother    Heart disease Mother        CHF   Clotting disorder Mother        warfarin   Colon polyps Mother    Colon cancer Maternal Aunt 69   Drug abuse Son    Breast cancer Neg Hx    Esophageal cancer Neg Hx    Stomach cancer Neg Hx    Rectal cancer Neg Hx    Allergies  Allergen Reactions   Tramadol  Hcl Nausea And Vomiting   Codeine Nausea And Vomiting   Penicillins Rash     Positive ROS: All other systems have been reviewed and were otherwise negative with the exception of those mentioned in the HPI and as above.  Physical Exam: General: Alert, no acute distress Cardiovascular: No pedal edema Respiratory: No cyanosis, no use of accessory musculature Skin: No lesions in the area of chief complaint Neurologic: Sensation intact distally  MUSCULOSKELETAL:   LLE Pain with left hip log roll  Skin is intact  Nontender  No knee or ankle effusion  Sens DPN, SPN, TN intact  Motor EHL, ext, flex 5/5  DP 2+, No significant edema  IMAGING: X-rays and CT scan reviewed demonstrates minimal displaced valgus impacted femoral neck fracture  Assessment: Principal Problem:   Fracture of femoral neck, left, closed (HCC) Active Problems:   Cognitive impairment   Alcohol use disorder   Dehydration  Left valgus impacted femoral neck fracture  Plan: Spoke with patient, patient's sitter, and patient's sister who was on the phone regarding the  nature of the patient's fracture.  Overall alignment of the fracture is reassuring for stable position.  Discussed that this can be fixed with cannulated screw fixation.  Discussed notable risk with screw fixation being nonunion or malunion..  If this develops patient may ultimately require conversion to arthroplasty in the future.  However, this is a smaller surgery with otherwise low risk of complication compared to  partial or total hip arthroplasty.  Patient and sister expressed understanding of the procedure and agreed to proceed.  The risks benefits and alternatives were discussed with the patient including but not limited to the risks of nonoperative treatment, versus surgical intervention including infection, bleeding, nerve injury, malunion, nonunion, the need for revision surgery, hardware prominence, hardware failure, the need for hardware removal, blood clots, cardiopulmonary complications, morbidity, mortality, among others, and they were willing to proceed.       Murleen Arms, MD  Contact information:   [id number] 7am-5pm epic message Dr. Pryor Browning, or call office for patient follow up: [phone] After hours and holidays please check [email or web address] for group call information for [facility]

## 2024-02-03 NOTE — Telephone Encounter (Signed)
 Noted

## 2024-02-04 ENCOUNTER — Encounter: Payer: Self-pay | Admitting: Internal Medicine

## 2024-02-04 ENCOUNTER — Encounter (HOSPITAL_COMMUNITY): Payer: Self-pay | Admitting: Orthopedic Surgery

## 2024-02-04 DIAGNOSIS — S72012A Unspecified intracapsular fracture of left femur, initial encounter for closed fracture: Secondary | ICD-10-CM | POA: Diagnosis not present

## 2024-02-04 LAB — COMPREHENSIVE METABOLIC PANEL WITH GFR
ALT: 13 U/L (ref 0–44)
AST: 18 U/L (ref 15–41)
Albumin: 3 g/dL — ABNORMAL LOW (ref 3.5–5.0)
Alkaline Phosphatase: 43 U/L (ref 38–126)
Anion gap: 12 (ref 5–15)
BUN: 12 mg/dL (ref 8–23)
CO2: 21 mmol/L — ABNORMAL LOW (ref 22–32)
Calcium: 8.5 mg/dL — ABNORMAL LOW (ref 8.9–10.3)
Chloride: 102 mmol/L (ref 98–111)
Creatinine, Ser: 1.01 mg/dL — ABNORMAL HIGH (ref 0.44–1.00)
GFR, Estimated: 60 mL/min — ABNORMAL LOW (ref >60)
Glucose, Bld: 113 mg/dL — ABNORMAL HIGH (ref 70–99)
Potassium: 3.4 mmol/L — ABNORMAL LOW (ref 3.5–5.1)
Sodium: 135 mmol/L (ref 135–145)
Total Bilirubin: 0.5 mg/dL (ref 0.0–1.2)
Total Protein: 5.9 g/dL — ABNORMAL LOW (ref 6.5–8.1)

## 2024-02-04 LAB — CBC
HCT: 34.3 % — ABNORMAL LOW (ref 36.0–46.0)
Hemoglobin: 10.9 g/dL — ABNORMAL LOW (ref 12.0–15.0)
MCH: 31.3 pg (ref 26.0–34.0)
MCHC: 31.8 g/dL (ref 30.0–36.0)
MCV: 98.6 fL (ref 80.0–100.0)
Platelets: 130 10*3/uL — ABNORMAL LOW (ref 150–400)
RBC: 3.48 MIL/uL — ABNORMAL LOW (ref 3.87–5.11)
RDW: 12.7 % (ref 11.5–15.5)
WBC: 7.8 10*3/uL (ref 4.0–10.5)
nRBC: 0 % (ref 0.0–0.2)

## 2024-02-04 MED ORDER — POTASSIUM CHLORIDE CRYS ER 20 MEQ PO TBCR
40.0000 meq | EXTENDED_RELEASE_TABLET | Freq: Once | ORAL | Status: AC
  Administered 2024-02-04: 40 meq via ORAL
  Filled 2024-02-04: qty 2

## 2024-02-04 MED ORDER — VITAMIN D (ERGOCALCIFEROL) 1.25 MG (50000 UNIT) PO CAPS
50000.0000 [IU] | ORAL_CAPSULE | ORAL | Status: DC
  Administered 2024-02-04 – 2024-02-11 (×2): 50000 [IU] via ORAL
  Filled 2024-02-04 (×2): qty 1

## 2024-02-04 NOTE — Plan of Care (Signed)
   Problem: Education: Goal: Knowledge of General Education information will improve Description: Including pain rating scale, medication(s)/side effects and non-pharmacologic comfort measures Outcome: Progressing   Problem: Clinical Measurements: Goal: Ability to maintain clinical measurements within normal limits will improve Outcome: Progressing   Problem: Elimination: Goal: Will not experience complications related to urinary retention Outcome: Progressing   Problem: Pain Managment: Goal: General experience of comfort will improve and/or be controlled Outcome: Progressing   Problem: Safety: Goal: Ability to remain free from injury will improve Outcome: Progressing

## 2024-02-04 NOTE — Evaluation (Signed)
 Occupational Therapy Evaluation Patient Details Name: [name] [name] MRN: [id number] DOB: 04/18/1951 Today's Date: 02/04/2024   History of Present Illness   [name] [name] is a 73 yo female s/p fall with L femoral neck fx. Underwent L hip ORIF 02/03/24. Now WBAT with no precautions and f/u ortho MD in 2 weeks. PMH: ETOH use, , CKD3a, HTN, HLD,  B12 deficiency, osteoporosis with prior vertebral fracture, dehydration,cognitive impairment,  limited SCLC s/p chemo, radiation, prophylactic cranial radiation, CAD by imaging     Clinical Impressions PTA, patient was living at home with 24/7 hired  caregiver who was present for eval this am. Patient used RW at all times and received assist with A/IADL's, community mobility, pet care and transportation. Currently, patient presents with deficits outlined below (see OT Problem List for details) most significantly pain, cognitive deficits (baseline as per cg), LE deficits including ROM/weakness and coordination, decreased balance and activity tolerance impacting BADL's and mobility. Cg Kim present reports all DME already in home and OT anticipates with continued therapy in Acute setting and HHOT recommendation, patient will be able to return home with 24/7 assist and supervision.      If plan is discharge home, recommend the following:   A lot of help with walking and/or transfers;A lot of help with bathing/dressing/bathroom;Direct supervision/assist for medications management;Direct supervision/assist for financial management;Assist for transportation;Help with stairs or ramp for entrance;Supervision due to cognitive status     Functional Status Assessment   Patient has had a recent decline in their functional status and demonstrates the ability to make significant improvements in function in a reasonable and predictable amount of time.     Equipment Recommendations   None recommended by OT      Precautions/Restrictions    Precautions Precautions: Fall Restrictions Weight Bearing Restrictions Per Provider Order: Yes LLE Weight Bearing Per Provider Order: Weight bearing as tolerated     Mobility Bed Mobility Overal bed mobility: Needs Assistance Bed Mobility: Supine to Sit     Supine to sit: Min assist, Mod assist, +2 for safety/equipment, Used rails     General bed mobility comments: Increased time with cues for sequence and use of R LE to self assist; physical assist to manage L LE, to control trunk and to complete rotation to EOB sitting using bed pad    Transfers Overall transfer level: Needs assistance Equipment used: Rolling walker (2 wheels) Transfers: Sit to/from Stand Sit to Stand: Min assist, Mod assist, +2 physical assistance, +2 safety/equipment, From elevated surface           General transfer comment: cues for LE management and use of UEs to self assist;  Physical assist to bring wt up and fwd and to balance in standing with RW      Balance Overall balance assessment: Needs assistance Sitting-balance support: No upper extremity supported, Feet supported Sitting balance-Leahy Scale: Fair     Standing balance support: Bilateral upper extremity supported Standing balance-Leahy Scale: Poor                             ADL either performed or assessed with clinical judgement   ADL Overall ADL's : Needs assistance/impaired Eating/Feeding: Set up;Sitting   Grooming: Wash/dry hands;Wash/dry face;Oral care;Sitting;Contact guard assist   Upper Body Bathing: Minimal assistance;Sitting   Lower Body Bathing: Total assistance;Sit to/from stand   Upper Body Dressing : Minimal assistance;Sitting   Lower Body Dressing: Total assistance;Sit to/from stand   Toilet  Transfer: Moderate assistance;BSC/3in1   Toileting- Clothing Manipulation and Hygiene: Maximal assistance       Functional mobility during ADLs: Moderate assistance;Rolling walker (2 wheels) General ADL  Comments: unable to perform LB selfc are at this time without significant assist     Vision Baseline Vision/History: 0 No visual deficits;1 Wears glasses Ability to See in Adequate Light: 0 Adequate Patient Visual Report: No change from baseline Vision Assessment?: No apparent visual deficits            Pertinent Vitals/Pain Pain Assessment Pain Assessment: Faces Faces Pain Scale: Hurts little more Pain Location: L hip with movement Pain Descriptors / Indicators: Aching, Sore, Grimacing Pain Intervention(s): Monitored during session, Premedicated before session, Repositioned     Extremity/Trunk Assessment Upper Extremity Assessment Upper Extremity Assessment: Right hand dominant;Overall WFL for tasks assessed   Lower Extremity Assessment Lower Extremity Assessment: Defer to PT evaluation LLE Deficits / Details: AAROM at hip to 90 flex and 10 abd   Cervical / Trunk Assessment Cervical / Trunk Assessment: Normal   Communication Communication Communication: No apparent difficulties   Cognition Arousal: Alert Behavior During Therapy: Impulsive Cognition: History of cognitive impairments             OT - Cognition Comments: decreased divided attention, masks cog deficits with humor which can distract safety, decreased organization, safety and jusdgement                 Following commands: Impaired Following commands impaired: Follows one step commands with increased time, Follows one step commands inconsistently     Cueing  General Comments   Cueing Techniques: Verbal cues;Gestural cues  post op dressing L hip intact           Home Living Family/patient expects to be discharged to:: Private residence Living Arrangements: Non-relatives/Friends Available Help at Discharge: Available 24 hours/day;Personal care attendant Type of Home: House Home Access: Level entry     Home Layout: One level     Bathroom Shower/Tub: Walk-in shower;Door   [employer]  Toilet: Handicapped height Bathroom Accessibility: Yes How Accessible: Accessible via walker Home Equipment: Agricultural consultant (2 wheels);Cane - single point;Cane - quad;BSC/3in1;Shower seat;Grab bars - tub/shower;Hand held shower head;Adaptive equipment   Additional Comments: Lives in accessible townhouse with CNA and dog      Prior Functioning/Environment Prior Level of Function : Needs assist  Cognitive Assist : ADLs (cognitive);Mobility (cognitive) Mobility (Cognitive): Intermittent cues ADLs (Cognitive): Intermittent cues Physical Assist : Mobility (physical);ADLs (physical)     Mobility Comments: using RW at all times ADLs Comments: has 24/7 PCA present for eval    OT Problem List: Decreased strength;Decreased range of motion;Decreased activity tolerance;Impaired balance (sitting and/or standing);Decreased cognition;Decreased safety awareness;Decreased knowledge of use of DME or AE;Decreased knowledge of precautions;Pain   OT Treatment/Interventions: Self-care/ADL training;Therapeutic exercise;Neuromuscular education;Energy conservation;DME and/or AE instruction;Therapeutic activities;Cognitive remediation/compensation;Patient/family education;Balance training      OT Goals(Current goals can be found in the care plan section)   Acute Rehab OT Goals Patient Stated Goal: to be back home with my dog and Burdette Carolin OT Goal Formulation: With patient/family Time For Goal Achievement: 02/18/24 Potential to Achieve Goals: Good ADL Goals Pt Will Perform Upper Body Bathing: with set-up;sitting Pt Will Perform Upper Body Dressing: with set-up;sitting Pt Will Transfer to Toilet: bedside commode;grab bars;ambulating;with min assist Pt Will Perform Toileting - Clothing Manipulation and hygiene: with min assist;sit to/from stand   OT Frequency:  Min 2X/week    Co-evaluation PT/OT/SLP Co-Evaluation/Treatment: Yes Reason for Co-Treatment: Complexity  of the patient's impairments (multi-system  involvement);For patient/therapist safety;To address functional/ADL transfers PT goals addressed during session: Mobility/safety with mobility;Balance;Proper use of DME OT goals addressed during session: ADL's and self-care;Proper use of Adaptive equipment and DME;Strengthening/ROM      AM-PAC OT 6 Clicks Daily Activity     Outcome Measure Help from another person eating meals?: A Little Help from another person taking care of personal grooming?: A Little Help from another person toileting, which includes using toliet, bedpan, or urinal?: A Lot Help from another person bathing (including washing, rinsing, drying)?: A Little Help from another person to put on and taking off regular upper body clothing?: A Little Help from another person to put on and taking off regular lower body clothing?: Total 6 Click Score: 15   End of Session Equipment Utilized During Treatment: Gait belt;Rolling walker (2 wheels) Nurse Communication: Mobility status;Weight bearing status  Activity Tolerance: Patient tolerated treatment well Patient left: in chair;with call bell/phone within reach;with chair alarm set;with family/visitor present  OT Visit Diagnosis: Unsteadiness on feet (R26.81);Muscle weakness (generalized) (M62.81);History of falling (Z91.81);Pain Pain - Right/Left: Left Pain - part of body: Hip                Time: 1610-9604 OT Time Calculation (min): 33 min Charges:  OT General Charges $OT Visit: 1 Visit OT Evaluation $OT Eval Low Complexity: 1 Low OT Treatments $Self Care/Home Management : 8-22 mins  Scotlynn Noyes OT/L Acute Rehabilitation Department  [phone]  02/04/2024, 1:28 PM

## 2024-02-04 NOTE — TOC Initial Note (Signed)
 Transition of Care [facility]) - Initial/Assessment Note    Patient Details  Name: [name] [name] MRN: [id number] Date of Birth: 01-12-1951  Transition of Care [facility] CM/SW Contact:    Delilah Fend, LCSW Phone Number: 02/04/2024, 3:07 PM  Clinical Narrative:                 Met with pt today to review for anticipated dc needs.  Pt's home health aide, Burdette Carolin present and pt agreeable to discussion to include her.  Both confirm that Burdette Carolin is pt's live in aide and providing 24/7 care.  They are fully anticipating dc home and pt eager to return to her dog, Punkin.  Therapies have evaluated and recommended HHPT/OT.  Pt agreeable and has no agency preference. Referral placed with [facility].  Pt already has needed DME in the home.   TOC will continue to follow for any additional needs.  Expected Discharge Plan: Home w Home Health Services Barriers to Discharge: Continued Medical Work up   Patient Goals and CMS Choice Patient states their goals for this hospitalization and ongoing recovery are:: return home          Expected Discharge Plan and Services In-house Referral: Clinical Social Work   Post Acute Care Choice: Home Health Living arrangements for the past 2 months: Single Family Home                 DME Arranged: N/A DME Agency: NA       HH Arranged: PT, OT HH Agency: [facility] Date HH Agency Contacted: 02/04/24 Time HH Agency Contacted: [phone] Representative spoke with at [facility] Agency: Loetta Ringer  Prior Living Arrangements/Services Living arrangements for the past 2 months: Single Family Home Lives with:: Other (Comment) (CNA) Patient language and need for interpreter reviewed:: Yes Do you feel safe going back to the place where you live?: Yes      Need for Family Participation in Patient Care: No (Comment) Care giver support system in place?: Yes (comment)   Criminal Activity/Legal Involvement Pertinent to Current Situation/Hospitalization: No - Comment as  needed  Activities of Daily Living   ADL Screening (condition at time of admission) Independently performs ADLs?: No Does the patient have a NEW difficulty with bathing/dressing/toileting/self-feeding that is expected to last >3 days?: No Does the patient have a NEW difficulty with getting in/out of bed, walking, or climbing stairs that is expected to last >3 days?: Yes (Initiates electronic notice to provider for possible PT consult) Does the patient have a NEW difficulty with communication that is expected to last >3 days?: No Is the patient deaf or have difficulty hearing?: No Does the patient have difficulty seeing, even when wearing glasses/contacts?: No Does the patient have difficulty concentrating, remembering, or making decisions?: Yes  Permission Sought/Granted Permission sought to share information with : Family Supports Permission granted to share information with : Yes, Verbal Permission Granted  Share Information with NAME: sister, Sidney Drain @ [phone]           Emotional Assessment Appearance:: Appears stated age Attitude/Demeanor/Rapport: Engaged Affect (typically observed): Accepting Orientation: : Oriented to Self, Oriented to Place, Oriented to  Time Alcohol / Substance Use: Alcohol Use Psych Involvement: No (comment)  Admission diagnosis:  Fracture of femoral neck, left, closed (HCC) [S72.002A] Closed subcapital fracture of left femur, initial encounter (HCC) [S72.012A] Patient Active Problem List   Diagnosis Date Noted   Closed subcapital fracture of left femur (HCC) 02/03/2024   Fracture of femoral neck, left,  closed (HCC) 02/02/2024   Dehydration 02/02/2024   Multiple closed fractures of ribs of left side 09/29/2022   Multiple closed fractures of proximal phalanx of finger 10/08/2021   Alcohol use disorder 09/11/2021   Vitamin B 12 deficiency 09/11/2021   Post-traumatic headache 09/11/2021   Insomnia 09/11/2021   Cognitive impairment  08/30/2021   Benign essential HTN    Hyperglycemia    Urinary incontinence    Pelvic pain    Closed pelvic fracture (HCC) 08/03/2020   Closed fracture of left superior pubic ramus (HCC) 07/30/2020   Left hip pain 07/30/2020   GAD (generalized anxiety disorder) 07/30/2020   DJD (degenerative joint disease) 09/20/2019   Familial hyperlipidemia 07/01/2018   Right shoulder pain 07/14/2017   Malignant melanoma of skin of canthus of right eye (HCC) 03/16/2017   PCP NOTES >>>>>>>>>>>>>>> 05/22/2015   High coronary artery calcium  score 02/14/2015   Hernia, abdominal 04/01/2013   Depression with anxiety 04/01/2012   Malignant neoplasm of upper lobe, bronchus or lung 09/30/2011   H/O: lung cancer 09/25/2011   Annual physical exam 09/05/2011   IBS -diarrhea predominant 09/05/2011   COPD (chronic obstructive pulmonary disease) (HCC) 09/05/2011   Osteoporosis 03/01/2010   TOBACCO ABUSE 11/14/2008   Dyslipidemia 05/18/2007   Essential hypertension 05/18/2007   PCP:  [name], MD Pharmacy:   [facility] [phone] [place], [place] - [address] AT [facility] [address] [place] [address] Phone: [phone] Fax: [phone]     Social Drivers of Health (SDOH) Social History: SDOH Screenings   Food Insecurity: No Food Insecurity (02/02/2024)  Housing: Low Risk  (02/02/2024)  Transportation Needs: No Transportation Needs (02/02/2024)  Utilities: Not At Risk (02/02/2024)  Alcohol Screen: Low Risk  (04/22/2023)  Depression (PHQ2-9): Low Risk  (07/13/2023)  Financial Resource Strain: Low Risk  (04/22/2023)  Physical Activity: Inactive (04/22/2023)  Social Connections: Socially Isolated (02/02/2024)  Stress: No Stress Concern Present (04/22/2023)  Tobacco Use: Medium Risk (02/03/2024)  Health Literacy: Adequate Health Literacy (04/22/2023)   SDOH Interventions:     Readmission Risk Interventions    02/04/2024    3:04 PM 02/04/2024    1:48 PM   Readmission Risk Prevention Plan  Transportation Screening Complete Complete  PCP or Specialist Appt within 5-7 Days  Complete  Home Care Screening  Complete  Medication Review (RN CM)  Complete

## 2024-02-04 NOTE — Progress Notes (Signed)
 PROGRESS NOTE    [name] [name]  ZOX:[id number] DOB: 1950/12/04 DOA: 02/02/2024 PCP: [name], MD    Brief Narrative:72 y.o. female with hx of limited SCLC s/p chemo, radiation, prophylactic cranial radiation, CAD by imaging, mild cognitive impairment, alcohol use disorder, CKD3a, HTN, HLD,  B12 deficiency, osteoporosis with prior vertebral fracture, who presents after ground level fall. Reports mechanical fall trying to get into bed, got tripped up on her dog, this occurred yesterday evening. Today with worsening pain and inability to transfer prompting ED visit. She has chronic numbness in her feet, no new numbness or weakness. Reports L hip pain is the worst but also c/o pain in the R hip. Denies hitting her head, is not on anticoagulants. No other areas of injury. No syncope. Otherwise reports 1 day of N/V/D and limited POs. Reports ongoing heavy alcohol use with 1-2 bottles of wine daily     Assessment & Plan:   Principal Problem:   Fracture of femoral neck, left, closed (HCC) Active Problems:   Cognitive impairment   Alcohol use disorder   Dehydration   Closed subcapital fracture of left femur (HCC)   Left femoral neck fracture Ground-level fall, mechanical  Osteoporosis-she is status post cannulated screw fixation of the valgus impacted femoral neck fracture 02/03/2024. 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 Has seen Dr. Liam Redhead and no evidence of recurrent disease. On surveillance outpatient.   CAD by  imaging/HLD: Holding aspirin . On repatha  outpatient. Continue rosuvastatin .   mild cognitive impairment: stable  CKD3a: Baseline Cr ~ 0.8 - 1.   HTN: Not on antihypertensives. Bp 153/90   B12 deficiency:  b 12 578  Hypokalemia potassium 3.4 replete mag 2.3    Estimated body mass index is 23.17 kg/m as calculated from the following:   Height as of this encounter: 5' 7 (1.702 m).   Weight as of this encounter: 67.1 kg.  DVT prophylaxis: aspirin  Code Status:full Family Communication: Caregiver at bedside Disposition Plan:  Status is: Inpatient Remains inpatient appropriate because: Acute illness   Consultants:  Ortho  Procedures: Left hip screw fixation 6/11 Antimicrobials: None Subjective: Up in bed anxious to start PT  Objective: Vitals:   02/03/24 2156 02/04/24 0136 02/04/24 0551 02/04/24 0910  BP: (!) 141/97 (!) 158/89 (!) 143/85 (!) 146/93  Pulse: (!) 102 (!) 102 (!) 106 (!) 108  Resp: 17 17 17    Temp: 98.9 F (37.2 C) 97.7 F (36.5 C) 98.2 F (36.8 C) 98.9 F (37.2 C)  TempSrc: Oral  Oral Oral  SpO2: 94% 96% 96% 96%  Weight:      Height:        Intake/Output Summary (Last 24 hours) at 02/04/2024 0947 Last data filed at 02/04/2024 8119 Gross per 24 hour  Intake 2083.99 ml  Output 625 ml  Net 1458.99 ml   Filed Weights   02/02/24 1907 02/03/24 0002  Weight: 68 kg 67.1 kg    Examination:  General exam: Appears in no acute distress Respiratory system: Clear to auscultation. Respiratory effort normal. Cardiovascular system:  S1 & S2 heard, RRR. No JVD, murmurs, rubs, gallops or clicks. No pedal edema. Gastrointestinal system: Abdomen is nondistended, soft and nontender. No organomegaly or masses felt. Normal bowel sounds heard. Central nervous system: Awake oriented to hospital  extremities: Left lower extremity edema Incision cdi  Data Reviewed: I have personally reviewed following labs and imaging studies  CBC: Recent Labs  Lab 02/02/24 1735  02/03/24 0329 02/04/24 0335  WBC 10.4 7.9 7.8  NEUTROABS 9.1*  --   --   HGB 11.9* 11.0* 10.9*  HCT 36.7 33.9* 34.3*  MCV 96.3 97.7 98.6  PLT 136* 128* 130*   Basic Metabolic Panel: Recent Labs  Lab 02/02/24 1735 02/03/24 0329 02/04/24 0335  NA 134* 136 135  K 3.9 3.4* 3.4*  CL 102 106 102  CO2 21* 22 21*  GLUCOSE 117* 102* 113*  BUN 16 15 12   CREATININE 1.05* 1.08* 1.01*  CALCIUM  9.2 8.7* 8.5*  MG  --  2.3  --   PHOS  --  3.0  --    GFR: Estimated Creatinine Clearance: 50.4 mL/min (A) (by C-G formula based on SCr of 1.01 mg/dL (H)). Liver Function Tests: Recent Labs  Lab 02/04/24 0335  AST 18  ALT 13  ALKPHOS 43  BILITOT 0.5  PROT 5.9*  ALBUMIN 3.0*   No results for input(s): LIPASE, AMYLASE in the last 168 hours. No results for input(s): AMMONIA in the last 168 hours. Coagulation Profile: Recent Labs  Lab 02/02/24 1735  INR 1.0   Cardiac Enzymes: No results for input(s): CKTOTAL, CKMB, CKMBINDEX, TROPONINI in the last 168 hours. BNP (last 3 results) No results for input(s): PROBNP in the last 8760 hours. HbA1C: No results for input(s): HGBA1C in the last 72 hours. CBG: No results for input(s): GLUCAP in the last 168 hours. Lipid Profile: No results for input(s): CHOL, HDL, LDLCALC, TRIG, CHOLHDL, LDLDIRECT in the last 72 hours. Thyroid  Function Tests: No results for input(s): TSH, T4TOTAL, FREET4, T3FREE, THYROIDAB in the last 72 hours. Anemia Panel: Recent Labs    02/03/24 0329  VITAMINB12 578   Sepsis Labs: No results for input(s): PROCALCITON, LATICACIDVEN in the last 168 hours.  Recent Results (from the past 240 hours)  Surgical PCR screen     Status: None   Collection Time: 02/03/24  2:44 AM   Specimen: Nasal Mucosa; Nasal Swab  Result Value Ref Range Status   MRSA, PCR NEGATIVE NEGATIVE Final   Staphylococcus aureus NEGATIVE NEGATIVE Final    Comment: (NOTE) The Xpert SA Assay (FDA  approved for NASAL specimens in patients 45 years of age and older), is one component of a comprehensive surveillance program. It is not intended to diagnose infection nor to guide or monitor treatment. Performed at [facility], [address] [place], [place] [address]          Radiology Studies: DG FEMUR PORT MIN 2 VIEWS LEFT Result Date: 02/03/2024 CLINICAL DATA:  Postoperative state left femur. EXAM: LEFT FEMUR PORTABLE 2 VIEWS COMPARISON:  CT left hip 02/02/2024 FINDINGS: New ORIF of the previously seen subcapital proximal left femoral fracture with spell 3 longitudinal screws. Minimal inferior displacement of the distal fracture component with respect to the proximal fracture component is similar to prior. No knee joint effusion. Minimal chronic enthesopathic change at the quadriceps insertion on the patella. IMPRESSION: New ORIF of the previously seen subcapital proximal left femoral fracture. Electronically Signed   By: Bertina Broccoli M.D.   On: 02/03/2024 13:46  DG HIP UNILAT WITH PELVIS 2-3 VIEWS LEFT Result Date: 02/03/2024 CLINICAL DATA:  Elective surgery.  Intraoperative left hip pinning. EXAM: DG HIP (WITH OR WITHOUT PELVIS) 2-3V LEFT COMPARISON:  Pelvis and left hip radiographs 02/02/2024, CT left hip 02/02/2024 FINDINGS: Images were performed intraoperatively without the presence of a radiologist. 3 longitudinal screw fixation of the proximal left femoral head and neck, fixating the previously seen subcapital fracture. Total fluoroscopy images: 9 Total fluoroscopy time: 54 seconds Total dose: Radiation Exposure Index (as provided by the fluoroscopic device): 7.9 mGy air Kerma Please see intraoperative findings for further detail. IMPRESSION: Intraoperative fluoroscopy for left hip pinning. Electronically Signed   By: Bertina Broccoli M.D.   On: 02/03/2024 12:10   DG C-Arm 1-60 Min-No Report Result Date: 02/03/2024 Fluoroscopy was utilized by the requesting  physician.  No radiographic interpretation.   DG HIP UNILAT WITH PELVIS 2-3 VIEWS RIGHT Result Date: 02/02/2024 CLINICAL DATA:  Right-sided hip pain EXAM: DG HIP (WITH OR WITHOUT PELVIS) 2-V RIGHT COMPARISON:  None Available. FINDINGS: Pelvic ring is intact. No acute fracture or dislocation is noted. No soft tissue abnormality is seen. IMPRESSION: No acute abnormality noted. Electronically Signed   By: Violeta Grey M.D.   On: 02/02/2024 23:24   DG Knee Right Port Result Date: 02/02/2024 CLINICAL DATA:  Fall yesterday with right knee pain, initial encounter EXAM: PORTABLE RIGHT KNEE - 2 VIEW COMPARISON:  None Available. FINDINGS: No evidence of fracture, dislocation, or joint effusion. No evidence of arthropathy or other focal bone abnormality. Soft tissues are unremarkable. IMPRESSION: No acute abnormality noted. Electronically Signed   By: Violeta Grey M.D.   On: 02/02/2024 23:19   CT Hip Left Wo Contrast Result Date: 02/02/2024 CLINICAL DATA:  Hip trauma, fracture suspected EXAM: CT OF THE LEFT HIP WITHOUT CONTRAST TECHNIQUE: Multidetector CT imaging of the left hip was performed according to the standard protocol. Multiplanar CT image reconstructions were also generated. RADIATION DOSE REDUCTION: This exam was performed according to the departmental dose-optimization program which includes automated exposure control, adjustment of the mA and/or kV according to patient size and/or use of iterative reconstruction technique. COMPARISON:  Same day hip radiograph FINDINGS: Bones/Joint/Cartilage Nondisplaced mildly impacted subcapital femoral neck fracture of the left femur. Remote pubic rami fractures on the left. Ligaments Suboptimally assessed by CT. Muscles and Tendons Grossly intact. Soft tissues Unremarkable. IMPRESSION: Nondisplaced mildly impacted subcapital femoral neck fracture of the left femur. Electronically Signed   By: Rozell Cornet M.D.   On: 02/02/2024 22:08   DG Hip Unilat With Pelvis 2-3  Views Left Result Date: 02/02/2024 CLINICAL DATA:  Marvell Slider 1 day ago EXAM: DG HIP (WITH OR WITHOUT PELVIS) 2-3V LEFT COMPARISON:  None Available. FINDINGS: Curvilinear lucency with surrounding sclerosis about the subcapital left femoral neck suspicious for nondisplaced fracture. No dislocation. Remote fractures of the left pubic rami. Degenerative changes pubic symphysis, both hips, SI joints and lower lumbar spine. IMPRESSION: Findings suspicious for nondisplaced subcapital left femoral neck fracture. CT recommended for further evaluation. Electronically Signed   By: Rozell Cornet M.D.   On: 02/02/2024 20:16   DG Chest Port 1 View Result Date: 02/02/2024 CLINICAL DATA:  Fall EXAM: PORTABLE CHEST 1 VIEW COMPARISON:  07/30/2023, CT chest 01/21/2024, 08/25/2021 FINDINGS: Distortion and bandlike fibrosis and scarring in the right hilar/suprahilar region. No acute airspace disease. Stable cardiomediastinal silhouette. No pneumothorax IMPRESSION: No active disease. Stable distortion and bandlike fibrosis and scarring in the right hilar/suprahilar region. Electronically Signed   By:  Esmeralda Hedge M.D.   On: 02/02/2024 20:15   Scheduled Meds:  aspirin  EC  81 mg Oral BID   cholecalciferol   1,000 Units Oral Daily   folic acid   1 mg Oral Daily   LORazepam   0-4 mg Intravenous Q6H   Or   LORazepam   0-4 mg Oral Q6H   [START ON 02/05/2024] LORazepam   0-4 mg Intravenous Q12H   Or   [START ON 02/05/2024] LORazepam   0-4 mg Oral Q12H   multivitamin with minerals  1 tablet Oral Daily   ondansetron  (ZOFRAN ) IV  4 mg Intravenous Q6H   potassium chloride   40 mEq Oral Once   sodium chloride  flush  3 mL Intravenous Q12H   thiamine   100 mg Oral Daily   Or   thiamine   100 mg Intravenous Daily   thiamine   100 mg Oral Daily   Or   thiamine   100 mg Intravenous Daily   Continuous Infusions:   LOS: 2 days    Time spent: 38 min  Barbee Lew, MD  02/04/2024, 9:47 AM

## 2024-02-04 NOTE — Plan of Care (Signed)

## 2024-02-04 NOTE — Progress Notes (Signed)
 Physical Therapy Treatment Patient Details Name: [name] [name] MRN: [id number] DOB: Nov 11, 1950 Today's Date: 02/04/2024   History of Present Illness [name] [name] is a 73 yo female s/p fall with L femoral neck fx. Underwent L hip ORIF 02/03/24. Now WBAT with no precautions and f/u ortho MD in 2 weeks. PMH: ETOH use, , CKD3a, HTN, HLD,  B12 deficiency, osteoporosis with prior vertebral fracture, dehydration,cognitive impairment,  limited SCLC s/p chemo, radiation, prophylactic cranial radiation, CAD by imaging    PT Comments  Pt continues cooperative but mildly impulsive and with increased anxiety.  Pt up to ambulate increased (but still limited) distance with decreased assist but continues to require significant assist for sit to stand and sit to supine transfers.  Pt repeatedly inquiring Can I go home now? my dog is waiting.   If plan is discharge home, recommend the following: A lot of help with walking and/or transfers;A little help with bathing/dressing/bathroom;Assistance with cooking/housework;Assist for transportation;Help with stairs or ramp for entrance   Can travel by private vehicle        Equipment Recommendations  None recommended by PT    Recommendations for Other Services       Precautions / Restrictions Precautions Precautions: Fall Restrictions Weight Bearing Restrictions Per Provider Order: Yes LLE Weight Bearing Per Provider Order: Weight bearing as tolerated     Mobility  Bed Mobility Overal bed mobility: Needs Assistance Bed Mobility: Sit to Supine     Supine to sit: Min assist, Mod assist, +2 for safety/equipment, Used rails Sit to supine: Min assist, Mod assist, +2 for physical assistance, +2 for safety/equipment   General bed mobility comments: Increased time with cues for sequence and use of R LE to self assist; physical assist to manage L LE, to control trunk and to complete rotation using bed pad    Transfers Overall transfer  level: Needs assistance Equipment used: Rolling walker (2 wheels) Transfers: Sit to/from Stand Sit to Stand: Min assist, Mod assist, +2 physical assistance, +2 safety/equipment, From elevated surface           General transfer comment: cues for LE management and use of UEs to self assist;  Physical assist to bring wt up and fwd and to balance in standing with RW    Ambulation/Gait Ambulation/Gait assistance: Min assist, +2 safety/equipment Gait Distance (Feet): 17 Feet Assistive device: Rolling walker (2 wheels) Gait Pattern/deviations: Step-to pattern, Decreased step length - right, Decreased step length - left, Shuffle, Trunk flexed Gait velocity: decr     General Gait Details: Increased time with cues for posture, sequence and position from [employer]             Wheelchair Mobility     Tilt Bed    Modified Rankin (Stroke Patients Only)       Balance Overall balance assessment: Needs assistance Sitting-balance support: No upper extremity supported, Feet supported Sitting balance-Leahy Scale: Fair     Standing balance support: Bilateral upper extremity supported Standing balance-Leahy Scale: Poor                              Communication Communication Communication: No apparent difficulties  Cognition Arousal: Alert Behavior During Therapy: Impulsive   PT - Cognitive impairments: History of cognitive impairments, Safety/Judgement                         Following commands: Impaired Following commands  impaired: Follows one step commands with increased time, Follows one step commands inconsistently    Cueing Cueing Techniques: Verbal cues, Gestural cues  Exercises General Exercises - Lower Extremity Ankle Circles/Pumps: AROM, Both, 15 reps, Supine Heel Slides: AAROM, Left, 15 reps, Supine Hip ABduction/ADduction: AAROM, Left, 15 reps, Supine    General Comments General comments (skin integrity, edema, etc.): post op  dressing L hip intact      Pertinent Vitals/Pain Pain Assessment Pain Assessment: Faces Faces Pain Scale: Hurts little more Pain Location: L hip with movement Pain Descriptors / Indicators: Aching, Sore, Grimacing Pain Intervention(s): Limited activity within patient's tolerance, Monitored during session, Premedicated before session    Home Living Family/patient expects to be discharged to:: Private residence Living Arrangements: Non-relatives/Friends Available Help at Discharge: Available 24 hours/day;Personal care attendant Type of Home: House Home Access: Level entry       Home Layout: One level Home Equipment: Agricultural consultant (2 wheels);Cane - single point;Cane - quad;BSC/3in1;Shower seat;Grab bars - tub/shower;Hand held shower head;Adaptive equipment Additional Comments: Lives in accessible townhouse with CNA and dog    Prior Function            PT Goals (current goals can now be found in the care plan section) Acute Rehab PT Goals Patient Stated Goal: HOME to my dog PT Goal Formulation: With patient Time For Goal Achievement: 02/11/24 Potential to Achieve Goals: Fair Progress towards PT goals: Progressing toward goals    Frequency    Min 5X/week      PT Plan      Co-evaluation   Reason for Co-Treatment: Complexity of the patient's impairments (multi-system involvement);For patient/therapist safety;To address functional/ADL transfers PT goals addressed during session: Mobility/safety with mobility;Balance;Proper use of DME OT goals addressed during session: ADL's and self-care;Proper use of Adaptive equipment and DME;Strengthening/ROM      AM-PAC PT 6 Clicks Mobility   Outcome Measure  Help needed turning from your back to your side while in a flat bed without using bedrails?: A Lot Help needed moving from lying on your back to sitting on the side of a flat bed without using bedrails?: A Lot Help needed moving to and from a bed to a chair (including a  wheelchair)?: A Lot Help needed standing up from a chair using your arms (e.g., wheelchair or bedside chair)?: A Lot Help needed to walk in hospital room?: A Lot Help needed climbing 3-5 steps with a railing? : Total 6 Click Score: 11    End of Session Equipment Utilized During Treatment: Gait belt Activity Tolerance: Patient tolerated treatment well;Patient limited by fatigue Patient left: in bed;with call bell/phone within reach;with bed alarm set;with family/visitor present Nurse Communication: Mobility status PT Visit Diagnosis: Unsteadiness on feet (R26.81);Difficulty in walking, not elsewhere classified (R26.2);Pain Pain - Right/Left: Left Pain - part of body: Hip     Time: 1610-9604 PT Time Calculation (min) (ACUTE ONLY): 24 min  Charges:    $Gait Training: 8-22 mins $Therapeutic Activity: 8-22 mins PT General Charges $$ ACUTE PT VISIT: 1 Visit                     Thedora Finlay PT Acute Rehabilitation Services Pager [phone] Office [phone]    Yecheskel Kurek 02/04/2024, 3:18 PM

## 2024-02-04 NOTE — Evaluation (Signed)
 Physical Therapy Evaluation Patient Details Name: [name] [name] MRN: [id number] DOB: 1950/10/19 Today's Date: 02/04/2024  History of Present Illness  [name] [name] is a 73 yo female s/p fall with L femoral neck fx. Underwent L hip ORIF 02/03/24. Now WBAT with no precautions and f/u ortho MD in 2 weeks. PMH: ETOH use, , CKD3a, HTN, HLD,  B12 deficiency, osteoporosis with prior vertebral fracture, dehydration,cognitive impairment,  limited SCLC s/p chemo, radiation, prophylactic cranial radiation, CAD by imaging  Clinical Impression  Pt admitted as above and presents with functional mobility limitations 2* decreased L LE strength/ROM, post op pain, balance deficits and premorbid deconditioning.  This date, pt requiring assist of 2 to exit bed and ambulate short distance in room - limited by pain/fatigue.  Pt hopes to progress to dc home to her dog Punkin and with 24/7 assist of CNA.        If plan is discharge home, recommend the following: A lot of help with walking and/or transfers;A little help with bathing/dressing/bathroom;Assistance with cooking/housework;Assist for transportation;Help with stairs or ramp for entrance   Can travel by private vehicle        Equipment Recommendations None recommended by PT  Recommendations for Other Services       Functional Status Assessment Patient has had a recent decline in their functional status and demonstrates the ability to make significant improvements in function in a reasonable and predictable amount of time.     Precautions / Restrictions Precautions Precautions: Fall Restrictions Weight Bearing Restrictions Per Provider Order: Yes LLE Weight Bearing Per Provider Order: Weight bearing as tolerated      Mobility  Bed Mobility Overal bed mobility: Needs Assistance Bed Mobility: Supine to Sit     Supine to sit: Min assist, Mod assist, +2 for safety/equipment, Used rails     General bed mobility comments:  Increased time with cues for sequence and use of R LE to self assist; physical assist to manage L LE, to control trunk and to complete rotation to EOB sitting using bed pad    Transfers Overall transfer level: Needs assistance Equipment used: Rolling walker (2 wheels) Transfers: Sit to/from Stand Sit to Stand: Min assist, Mod assist, +2 physical assistance, +2 safety/equipment, From elevated surface           General transfer comment: cues for LE management and use of UEs to self assist;  Physical assist to bring wt up and fwd and to balance in standing with RW    Ambulation/Gait Ambulation/Gait assistance: Min assist, Mod assist, +2 safety/equipment Gait Distance (Feet): 7 Feet Assistive device: Rolling walker (2 wheels) Gait Pattern/deviations: Step-to pattern, Decreased step length - right, Decreased step length - left, Shuffle, Trunk flexed Gait velocity: decr     General Gait Details: Increased time with cues for posture, sequence and position from [employer]            Wheelchair Mobility     Tilt Bed    Modified Rankin (Stroke Patients Only)       Balance Overall balance assessment: Needs assistance Sitting-balance support: No upper extremity supported, Feet supported Sitting balance-Leahy Scale: Fair     Standing balance support: Bilateral upper extremity supported Standing balance-Leahy Scale: Poor                               Pertinent Vitals/Pain Pain Assessment Pain Assessment: Faces Faces Pain Scale: Hurts little more Pain Location:  L hip with movement Pain Descriptors / Indicators: Aching, Sore, Grimacing Pain Intervention(s): Limited activity within patient's tolerance, Monitored during session (Pt received Tylenol  only - declined other pain meds)    Home Living Family/patient expects to be discharged to:: Private residence Living Arrangements: Non-relatives/Friends Available Help at Discharge: Available 24  hours/day;Personal care attendant Type of Home: House Home Access: Level entry       Home Layout: One level Home Equipment: Agricultural consultant (2 wheels);Cane - single point;Cane - quad;BSC/3in1;Shower seat;Grab bars - tub/shower;Hand held shower head;Adaptive equipment Additional Comments: Lives in accessible townhouse with CNA and dog    Prior Function Prior Level of Function : Needs assist             Mobility Comments: using RW at all times       Extremity/Trunk Assessment   Upper Extremity Assessment Upper Extremity Assessment: Defer to OT evaluation    Lower Extremity Assessment Lower Extremity Assessment: LLE deficits/detail LLE Deficits / Details: AAROM at hip to 90 flex and 10 abd       Communication   Communication Communication: No apparent difficulties    Cognition Arousal: Alert Behavior During Therapy: Impulsive   PT - Cognitive impairments: History of cognitive impairments, Safety/Judgement                         Following commands: Impaired Following commands impaired: Follows one step commands with increased time, Follows one step commands inconsistently     Cueing Cueing Techniques: Verbal cues, Gestural cues     General Comments      Exercises General Exercises - Lower Extremity Ankle Circles/Pumps: AROM, Both, 15 reps, Supine Heel Slides: AAROM, Left, 15 reps, Supine Hip ABduction/ADduction: AAROM, Left, 15 reps, Supine   Assessment/Plan    PT Assessment Patient needs continued PT services  PT Problem List Decreased strength;Decreased range of motion;Decreased activity tolerance;Decreased balance;Decreased mobility;Decreased cognition;Decreased knowledge of use of DME;Decreased safety awareness;Pain       PT Treatment Interventions DME instruction;Gait training;Functional mobility training;Therapeutic activities;Therapeutic exercise;Patient/family education    PT Goals (Current goals can be found in the Care Plan section)   Acute Rehab PT Goals Patient Stated Goal: HOME to my dog PT Goal Formulation: With patient Time For Goal Achievement: 02/11/24 Potential to Achieve Goals: Fair    Frequency Min 5X/week     Co-evaluation PT/OT/SLP Co-Evaluation/Treatment: Yes Reason for Co-Treatment: Complexity of the patient's impairments (multi-system involvement);For patient/therapist safety;To address functional/ADL transfers PT goals addressed during session: Mobility/safety with mobility;Balance;Proper use of DME OT goals addressed during session: ADL's and self-care;Proper use of Adaptive equipment and DME;Strengthening/ROM       AM-PAC PT 6 Clicks Mobility  Outcome Measure Help needed turning from your back to your side while in a flat bed without using bedrails?: A Lot Help needed moving from lying on your back to sitting on the side of a flat bed without using bedrails?: A Lot Help needed moving to and from a bed to a chair (including a wheelchair)?: A Lot Help needed standing up from a chair using your arms (e.g., wheelchair or bedside chair)?: A Lot Help needed to walk in hospital room?: A Lot Help needed climbing 3-5 steps with a railing? : Total 6 Click Score: 11    End of Session Equipment Utilized During Treatment: Gait belt Activity Tolerance: Patient tolerated treatment well;Patient limited by fatigue Patient left: in chair;with call bell/phone within reach;with chair alarm set Nurse Communication: Mobility status PT  Visit Diagnosis: Unsteadiness on feet (R26.81);Difficulty in walking, not elsewhere classified (R26.2);Pain Pain - Right/Left: Left Pain - part of body: Hip    Time: 1610-9604 PT Time Calculation (min) (ACUTE ONLY): 34 min   Charges:   PT Evaluation $PT Eval Low Complexity: 1 Low   PT General Charges $$ ACUTE PT VISIT: 1 Visit         [name] [name] PT Acute Rehabilitation Services Pager [phone] Office [phone]   [name] [name] 02/04/2024, 11:51  AM

## 2024-02-04 NOTE — Progress Notes (Signed)
     Subjective: Patient reports pain as mild.  She is very pleased with how little pain she had overnight.   Discussed plan for mobilization with therapy today.  Caretaker Burdette Carolin is at the bedside.  Spoke with the sister over the phone.  Overall they are hopeful with the amount of support she has at home that she may be able to discharge to home depending on her progress with physical therapy.  Objective:   VITALS:   Vitals:   02/03/24 1757 02/03/24 2156 02/04/24 0136 02/04/24 0551  BP: (!) 145/87 (!) 141/97 (!) 158/89 (!) 143/85  Pulse: (!) 102 (!) 102 (!) 102 (!) 106  Resp: 16 17 17 17   Temp: 98.1 F (36.7 C) 98.9 F (37.2 C) 97.7 F (36.5 C) 98.2 F (36.8 C)  TempSrc:  Oral  Oral  SpO2: 98% 94% 96% 96%  Weight:      Height:        Sensation intact distally Intact pulses distally Dorsiflexion/Plantar flexion intact Incision: dressing C/D/I Compartment soft    Lab Results  Component Value Date   WBC 7.8 02/04/2024   HGB 10.9 (L) 02/04/2024   HCT 34.3 (L) 02/04/2024   MCV 98.6 02/04/2024   PLT 130 (L) 02/04/2024   BMET    Component Value Date/Time   NA 135 02/04/2024 0335   NA 143 02/05/2017 0817   K 3.4 (L) 02/04/2024 0335   K 4.3 02/05/2017 0817   CL 102 02/04/2024 0335   CL 104 12/23/2012 1338   CO2 21 (L) 02/04/2024 0335   CO2 25 02/05/2017 0817   GLUCOSE 113 (H) 02/04/2024 0335   GLUCOSE 96 02/05/2017 0817   GLUCOSE 85 12/23/2012 1338   BUN 12 02/04/2024 0335   BUN 16.0 02/05/2017 0817   CREATININE 1.01 (H) 02/04/2024 0335   CREATININE 1.30 (H) 01/21/2024 1332   CREATININE 1.0 02/05/2017 0817   CALCIUM  8.5 (L) 02/04/2024 0335   CALCIUM  9.5 02/05/2017 0817   EGFR 60 (L) 02/05/2017 0817   GFRNONAA 60 (L) 02/04/2024 0335   GFRNONAA 44 (L) 01/21/2024 1332      Xray: PACU x-rays reviewed demonstrate cannulated screw fixation of valgus impacted femoral neck fracture  Assessment/Plan: 1 Day Post-Op   Principal Problem:   Fracture of femoral neck,  left, closed (HCC) Active Problems:   Cognitive impairment   Alcohol use disorder   Dehydration   Closed subcapital fracture of left femur (HCC)  Status post left hip fracture cannulated screw fixation 02/03/2024  Post op recs: WB: WBAT no precautions Abx: ancef  x23 hours post op Imaging: PACU xrays Dressing: keep intact until follow up, change PRN if soiled or saturated. DVT prophylaxis: ASA 81 mg twice daily x 4 weeks Follow up: 2 weeks after surgery for a wound check with Dr. Pryor Browning at [facility]  Address: [address], [place], [place] [address]  Office Phone: [phone]    Murleen Arms 02/04/2024, 6:22 AM   Priscille Brought, MD  Contact information:   [phone] 7am-5pm epic message Dr. Pryor Browning, or call office for patient follow up: [phone] After hours and holidays please check [email or web address] for group call information for [facility]

## 2024-02-05 ENCOUNTER — Other Ambulatory Visit (HOSPITAL_COMMUNITY): Payer: Self-pay

## 2024-02-05 DIAGNOSIS — S72012A Unspecified intracapsular fracture of left femur, initial encounter for closed fracture: Secondary | ICD-10-CM | POA: Diagnosis not present

## 2024-02-05 LAB — COMPREHENSIVE METABOLIC PANEL WITH GFR
ALT: 8 U/L (ref 0–44)
AST: 15 U/L (ref 15–41)
Albumin: 2.7 g/dL — ABNORMAL LOW (ref 3.5–5.0)
Alkaline Phosphatase: 40 U/L (ref 38–126)
Anion gap: 11 (ref 5–15)
BUN: 13 mg/dL (ref 8–23)
CO2: 24 mmol/L (ref 22–32)
Calcium: 8.4 mg/dL — ABNORMAL LOW (ref 8.9–10.3)
Chloride: 102 mmol/L (ref 98–111)
Creatinine, Ser: 0.92 mg/dL (ref 0.44–1.00)
GFR, Estimated: >60 mL/min (ref >60)
Glucose, Bld: 87 mg/dL (ref 70–99)
Potassium: 3.2 mmol/L — ABNORMAL LOW (ref 3.5–5.1)
Sodium: 137 mmol/L (ref 135–145)
Total Bilirubin: 0.7 mg/dL (ref 0.0–1.2)
Total Protein: 5.3 g/dL — ABNORMAL LOW (ref 6.5–8.1)

## 2024-02-05 LAB — CBC
HCT: 30.7 % — ABNORMAL LOW (ref 36.0–46.0)
Hemoglobin: 9.7 g/dL — ABNORMAL LOW (ref 12.0–15.0)
MCH: 31 pg (ref 26.0–34.0)
MCHC: 31.6 g/dL (ref 30.0–36.0)
MCV: 98.1 fL (ref 80.0–100.0)
Platelets: 120 10*3/uL — ABNORMAL LOW (ref 150–400)
RBC: 3.13 MIL/uL — ABNORMAL LOW (ref 3.87–5.11)
RDW: 12.8 % (ref 11.5–15.5)
WBC: 5.5 10*3/uL (ref 4.0–10.5)
nRBC: 0 % (ref 0.0–0.2)

## 2024-02-05 MED ORDER — HYDROCODONE-ACETAMINOPHEN 5-325 MG PO TABS
1.0000 | ORAL_TABLET | Freq: Four times a day (QID) | ORAL | 0 refills | Status: DC | PRN
  Filled 2024-02-05: qty 21, 6d supply, fill #0

## 2024-02-05 MED ORDER — ONDANSETRON HCL 4 MG PO TABS
4.0000 mg | ORAL_TABLET | Freq: Three times a day (TID) | ORAL | 0 refills | Status: DC | PRN
  Filled 2024-02-05: qty 14, 5d supply, fill #0

## 2024-02-05 MED ORDER — ASPIRIN 81 MG PO TBEC: 81.0000 mg | DELAYED_RELEASE_TABLET | Freq: Two times a day (BID) | ORAL | Status: AC

## 2024-02-05 MED ORDER — POTASSIUM CHLORIDE CRYS ER 20 MEQ PO TBCR
40.0000 meq | EXTENDED_RELEASE_TABLET | Freq: Once | ORAL | Status: AC
  Administered 2024-02-05: 40 meq via ORAL
  Filled 2024-02-05: qty 2

## 2024-02-05 NOTE — Progress Notes (Signed)
 Physical Therapy Treatment Patient Details Name: MAYLEIGH TETRAULT MRN: [id number] DOB: [birth year] Today's Date: 02/05/2024   History of Present Illness Pricilla Gilder is a 73 yo female s/p fall with L femoral neck fx. Underwent L hip ORIF 02/03/24. Now WBAT with no precautions and f/u ortho MD in 2 weeks. PMH: ETOH use, , CKD3a, HTN, HLD,  B12 deficiency, osteoporosis with prior vertebral fracture, dehydration,cognitive impairment,  limited SCLC s/p chemo, radiation, prophylactic cranial radiation, CAD by imaging    PT Comments  Pt progressing with mobility but continues to require increased time, step-by-step cues and significant (but decreasing) level of assist for safe performance of all basic mobility tasks.    If plan is discharge home, recommend the following: A lot of help with walking and/or transfers;A little help with bathing/dressing/bathroom;Assistance with cooking/housework;Assist for transportation;Help with stairs or ramp for entrance   Can travel by private vehicle     No  Equipment Recommendations  None recommended by PT    Recommendations for Other Services       Precautions / Restrictions Precautions Precautions: Fall Restrictions Weight Bearing Restrictions Per Provider Order: No LLE Weight Bearing Per Provider Order: Weight bearing as tolerated     Mobility  Bed Mobility Overal bed mobility: Needs Assistance Bed Mobility: Sit to Supine     Supine to sit: Min assist, Mod assist Sit to supine: Min assist, Mod assist, +2 for physical assistance   General bed mobility comments: Increased time with cues for sequence and use of R LE to self assist; physical assist to manage L LE, to control trunk and to complete rotation using bed pad    Transfers Overall transfer level: Needs assistance Equipment used: Rolling walker (2 wheels) Transfers: Sit to/from Stand Sit to Stand: Min assist, Mod assist           General transfer comment: cues for  LE management and use of UEs self assist. Physical assist to bring wt up and fwd and to balance in initial standing with RW    Ambulation/Gait Ambulation/Gait assistance: Min assist Gait Distance (Feet): 23 Feet Assistive device: Rolling walker (2 wheels) Gait Pattern/deviations: Step-to pattern, Decreased step length - right, Decreased step length - left, Shuffle, Trunk flexed Gait velocity: decr     General Gait Details: Increased time with cues for posture, sequence and position from RW; distance ltd by c/o pain - pt states 10/10 but facial expression implies 4/10   Stairs             Wheelchair Mobility     Tilt Bed    Modified Rankin (Stroke Patients Only)       Balance Overall balance assessment: Needs assistance Sitting-balance support: No upper extremity supported, Feet supported Sitting balance-Leahy Scale: Good     Standing balance support: Bilateral upper extremity supported Standing balance-Leahy Scale: Poor                              Communication Communication Communication: No apparent difficulties  Cognition Arousal: Alert Behavior During Therapy: Impulsive   PT - Cognitive impairments: History of cognitive impairments, Safety/Judgement                         Following commands: Impaired Following commands impaired: Follows one step commands with increased time, Follows one step commands inconsistently    Cueing Cueing Techniques: Verbal cues, Gestural cues  Exercises General Exercises - Lower  Extremity Ankle Circles/Pumps: AROM, Both, 15 reps, Supine Quad Sets: AROM, Both, 10 reps, Supine Heel Slides: AAROM, Left, 15 reps, Supine Hip ABduction/ADduction: AAROM, Left, 15 reps, Supine    General Comments General comments (skin integrity, edema, etc.): no edema, dressing on hip intact      Pertinent Vitals/Pain Pain Assessment Pain Assessment: 0-10 Pain Score: 5  Faces Pain Scale: Hurts worst Pain Location:  L hip with movement Pain Descriptors / Indicators: Aching, Sore, Grimacing Pain Intervention(s): Limited activity within patient's tolerance, Monitored during session, Premedicated before session    Home Living                          Prior Function            PT Goals (current goals can now be found in the care plan section) Acute Rehab PT Goals Patient Stated Goal: HOME to my dog PT Goal Formulation: With patient Time For Goal Achievement: 02/11/24 Potential to Achieve Goals: Fair Progress towards PT goals: Progressing toward goals    Frequency    Min 5X/week      PT Plan      Co-evaluation              AM-PAC PT 6 Clicks Mobility   Outcome Measure  Help needed turning from your back to your side while in a flat bed without using bedrails?: A Lot Help needed moving from lying on your back to sitting on the side of a flat bed without using bedrails?: A Lot Help needed moving to and from a bed to a chair (including a wheelchair)?: A Lot Help needed standing up from a chair using your arms (e.g., wheelchair or bedside chair)?: A Lot Help needed to walk in hospital room?: A Little Help needed climbing 3-5 steps with a railing? : Total 6 Click Score: 12    End of Session Equipment Utilized During Treatment: Gait belt Activity Tolerance: Patient tolerated treatment well Patient left: in bed;with call bell/phone within reach;with bed alarm set;with family/visitor present Nurse Communication: Mobility status PT Visit Diagnosis: Unsteadiness on feet (R26.81);Difficulty in walking, not elsewhere classified (R26.2);Pain Pain - Right/Left: Left Pain - part of body: Hip     Time: 1610-9604 PT Time Calculation (min) (ACUTE ONLY): 20 min  Charges:    $Gait Training: 8-22 mins PT General Charges $$ ACUTE PT VISIT: 1 Visit                     Thedora Finlay PT Acute Rehabilitation Services Pager [phone] Office  [phone]    Mykelti Goldenstein 02/05/2024, 5:14 PM

## 2024-02-05 NOTE — Progress Notes (Signed)
     Subjective: Patient reports pain as mild, slept well.  Walked 7+62ft with PT yesterday.  Caretaker Burdette Carolin is at the bedside.  Overall they are hopeful with the amount of support she has at home that she may be able to discharge to home depending on her progress with physical therapy.  Denies numbness or tingling in leg.  No other complaints.  Objective:   VITALS:   Vitals:   02/04/24 0910 02/04/24 1349 02/04/24 2028 02/05/24 0542  BP: (!) 146/93 (!) 153/92 131/73 (!) 129/94  Pulse: (!) 108 100 (!) 103 (!) 108  Resp:  17 17 16   Temp: 98.9 F (37.2 C) 98.7 F (37.1 C) 98.6 F (37 C) 98.7 F (37.1 C)  TempSrc: Oral Oral Oral   SpO2: 96% 95% 94% 93%  Weight:      Height:        AAOx4, resting comfortably in bed in NAD Sensation intact distally Intact pulses distally Dorsiflexion/Plantar flexion intact Incision: dressing C/D/I Compartment soft Wiggles toes appropriately    Lab Results  Component Value Date   WBC 7.8 02/04/2024   HGB 10.9 (L) 02/04/2024   HCT 34.3 (L) 02/04/2024   MCV 98.6 02/04/2024   PLT 130 (L) 02/04/2024   BMET    Component Value Date/Time   NA 135 02/04/2024 0335   NA 143 02/05/2017 0817   K 3.4 (L) 02/04/2024 0335   K 4.3 02/05/2017 0817   CL 102 02/04/2024 0335   CL 104 12/23/2012 1338   CO2 21 (L) 02/04/2024 0335   CO2 25 02/05/2017 0817   GLUCOSE 113 (H) 02/04/2024 0335   GLUCOSE 96 02/05/2017 0817   GLUCOSE 85 12/23/2012 1338   BUN 12 02/04/2024 0335   BUN 16.0 02/05/2017 0817   CREATININE 1.01 (H) 02/04/2024 0335   CREATININE 1.30 (H) 01/21/2024 1332   CREATININE 1.0 02/05/2017 0817   CALCIUM  8.5 (L) 02/04/2024 0335   CALCIUM  9.5 02/05/2017 0817   EGFR 60 (L) 02/05/2017 0817   GFRNONAA 60 (L) 02/04/2024 0335   GFRNONAA 44 (L) 01/21/2024 1332      Xray: PACU x-rays reviewed demonstrate cannulated screw fixation of valgus impacted femoral neck fracture  Assessment/Plan: 2 Days Post-Op   Principal Problem:   Fracture of  femoral neck, left, closed (HCC) Active Problems:   Cognitive impairment   Alcohol  use disorder   Dehydration   Closed subcapital fracture of left femur (HCC)  Status post left hip fracture cannulated screw fixation 02/03/2024  Post op recs: WB: WBAT no precautions Abx: ancef  x23 hours post op Imaging: PACU xrays Dressing: keep intact until follow up, change PRN if soiled or saturated. DVT prophylaxis: ASA 81 mg twice daily x 4 weeks Follow up: 2 weeks after surgery for a wound check with Dr. Pryor Browning at [facility].  Address: [address], [place], [place] [address]  Office Phone: [phone]    Albertus Alt 02/05/2024, 6:49 AM    Contact information:   Weekdays 7am-5pm epic message Dr. Pryor Browning, or call office for patient follow up: [phone] After hours and holidays please check [email or web address] for group call information for [facility]

## 2024-02-05 NOTE — Plan of Care (Signed)
 Problem: Clinical Measurements: Goal: Ability to maintain clinical measurements within normal limits will improve Outcome: Progressing   Problem: Activity: Goal: Risk for activity intolerance will decrease Outcome: Progressing   Problem: Coping: Goal: Level of anxiety will decrease Outcome: Progressing   Problem: Pain Managment: Goal: General experience of comfort will improve and/or be controlled Outcome: Progressing   Problem: Safety: Goal: Ability to remain free from injury will improve Outcome: Progressing   Ara Knee, RN 02/05/24 5:10 PM

## 2024-02-05 NOTE — Progress Notes (Signed)
 Physical Therapy Treatment Patient Details Name: [name] [name] MRN: [id number] DOB: 1951/05/10 Today's Date: 02/05/2024   History of Present Illness [name] [name] is a 73 yo female s/p fall with L femoral neck fx. Underwent L hip ORIF 02/03/24. Now WBAT with no precautions and f/u ortho MD in 2 weeks. PMH: ETOH use, , CKD3a, HTN, HLD,  B12 deficiency, osteoporosis with prior vertebral fracture, dehydration,cognitive impairment,  limited SCLC s/p chemo, radiation, prophylactic cranial radiation, CAD by imaging    PT Comments  Pt progressing with mobility but continues to require increased time, step-by-step cues and significant (but decreasing) level of assist for safe performance of all basic mobility tasks.  Pt is largely cooperative but self-limiting and requiring encouragement to progress.  Pt continues to repeatedly insist she wants to go home but in private conversation with caregiver, caregiver states she can not manage pt at current level of assist.  Patient will benefit from continued inpatient follow up therapy, <3 hours/day unless progress during acute stay improves to allow safe return home.    If plan is discharge home, recommend the following: A lot of help with walking and/or transfers;A little help with bathing/dressing/bathroom;Assistance with cooking/housework;Assist for transportation;Help with stairs or ramp for entrance   Can travel by private vehicle     No  Equipment Recommendations  None recommended by PT    Recommendations for Other Services       Precautions / Restrictions Precautions Precautions: Fall Restrictions Weight Bearing Restrictions Per Provider Order: No LLE Weight Bearing Per Provider Order: Weight bearing as tolerated     Mobility  Bed Mobility Overal bed mobility: Needs Assistance Bed Mobility: Supine to Sit     Supine to sit: Min assist, Mod assist     General bed mobility comments: Increased time with cues for sequence  and use of R LE to self assist; physical assist to manage L LE, to control trunk and to complete rotation using bed pad    Transfers Overall transfer level: Needs assistance Equipment used: Rolling walker (2 wheels) Transfers: Sit to/from Stand Sit to Stand: Min assist           General transfer comment: cues for LE management and use of UEs to self assist;  Physical assist to bring wt up and fwd and to balance in standing with RW    Ambulation/Gait Ambulation/Gait assistance: Min assist, +2 safety/equipment Gait Distance (Feet): 11 Feet Assistive device: Rolling walker (2 wheels) Gait Pattern/deviations: Step-to pattern, Decreased step length - right, Decreased step length - left, Shuffle, Trunk flexed Gait velocity: decr     General Gait Details: Increased time with cues for posture, sequence and position from RW; distance ltd by c/o pain - pt states 10/10 but facial expression implies 4/10   Stairs             Wheelchair Mobility     Tilt Bed    Modified Rankin (Stroke Patients Only)       Balance Overall balance assessment: Needs assistance Sitting-balance support: No upper extremity supported, Feet supported Sitting balance-Leahy Scale: Good     Standing balance support: Bilateral upper extremity supported Standing balance-Leahy Scale: Poor                              Communication Communication Communication: No apparent difficulties  Cognition Arousal: Alert Behavior During Therapy: Impulsive   PT - Cognitive impairments: History of cognitive impairments, Safety/Judgement  Following commands: Impaired Following commands impaired: Follows one step commands with increased time, Follows one step commands inconsistently    Cueing Cueing Techniques: Verbal cues, Gestural cues  Exercises General Exercises - Lower Extremity Ankle Circles/Pumps: AROM, Both, 15 reps, Supine Quad Sets: AROM, Both, 10  reps, Supine Heel Slides: AAROM, Left, 15 reps, Supine Hip ABduction/ADduction: AAROM, Left, 15 reps, Supine    General Comments        Pertinent Vitals/Pain Pain Assessment Pain Assessment: 0-10 Faces Pain Scale: Hurts worst Pain Location: L hip with movement Pain Descriptors / Indicators: Aching, Sore, Grimacing Pain Intervention(s): Limited activity within patient's tolerance, Monitored during session, Premedicated before session, Ice applied    Home Living                          Prior Function            PT Goals (current goals can now be found in the care plan section) Acute Rehab PT Goals Patient Stated Goal: HOME to my dog PT Goal Formulation: With patient Time For Goal Achievement: 02/11/24 Potential to Achieve Goals: Fair Progress towards PT goals: Progressing toward goals    Frequency    Min 5X/week      PT Plan      Co-evaluation              AM-PAC PT 6 Clicks Mobility   Outcome Measure  Help needed turning from your back to your side while in a flat bed without using bedrails?: A Lot Help needed moving from lying on your back to sitting on the side of a flat bed without using bedrails?: A Lot Help needed moving to and from a bed to a chair (including a wheelchair)?: A Lot Help needed standing up from a chair using your arms (e.g., wheelchair or bedside chair)?: A Lot Help needed to walk in hospital room?: A Lot Help needed climbing 3-5 steps with a railing? : Total 6 Click Score: 11    End of Session Equipment Utilized During Treatment: Gait belt Activity Tolerance: Patient tolerated treatment well;Patient limited by fatigue;Patient limited by pain Patient left: in chair;with call bell/phone within reach;with chair alarm set;with family/visitor present Nurse Communication: Mobility status PT Visit Diagnosis: Unsteadiness on feet (R26.81);Difficulty in walking, not elsewhere classified (R26.2);Pain Pain - Right/Left:  Left Pain - part of body: Hip     Time: 1610-9604 PT Time Calculation (min) (ACUTE ONLY): 35 min  Charges:    $Gait Training: 8-22 mins $Therapeutic Exercise: 8-22 mins PT General Charges $$ ACUTE PT VISIT: 1 Visit                     Thedora Finlay PT Acute Rehabilitation Services Pager [phone] Office [phone]    Sterlin Knightly 02/05/2024, 10:47 AM

## 2024-02-05 NOTE — NC FL2 (Signed)
 [place]  MEDICAID FL2 LEVEL OF CARE FORM     IDENTIFICATION  Patient Name: [name] [name] Birthdate: 1951-01-23 Sex: female Admission Date (Current Location): 02/02/2024  [facility] and [place] Number:  Producer, television/film/video and Address:  [facility],  [address], [place] [address]      Provider Number: [id number]  Attending Physician Name and Address:  Barbee Lew, MD  Relative Name and Phone Number:       Current Level of Care: Hospital Recommended Level of Care: Skilled Nursing Facility Prior Approval Number:    Date Approved/Denied:   PASRR Number: [id number]  Discharge Plan: SNF    Current Diagnoses: Patient Active Problem List   Diagnosis Date Noted   Closed subcapital fracture of left femur (HCC) 02/03/2024   Fracture of femoral neck, left, closed (HCC) 02/02/2024   Dehydration 02/02/2024   Multiple closed fractures of ribs of left side 09/29/2022   Multiple closed fractures of proximal phalanx of finger 10/08/2021   Alcohol  use disorder 09/11/2021   Vitamin B 12 deficiency 09/11/2021   Post-traumatic headache 09/11/2021   Insomnia 09/11/2021   Cognitive impairment 08/30/2021   Benign essential HTN    Hyperglycemia    Urinary incontinence    Pelvic pain    Closed pelvic fracture (HCC) 08/03/2020   Closed fracture of left superior pubic ramus (HCC) 07/30/2020   Left hip pain 07/30/2020   GAD (generalized anxiety disorder) 07/30/2020   DJD (degenerative joint disease) 09/20/2019   Familial hyperlipidemia 07/01/2018   Right shoulder pain 07/14/2017   Malignant melanoma of skin of [place]hus of right eye (HCC) 03/16/2017   PCP NOTES >>>>>>>>>>>>>>> 05/22/2015   High coronary artery calcium  score 02/14/2015   Hernia, abdominal 04/01/2013   Depression with anxiety 04/01/2012   Malignant neoplasm of upper lobe, bronchus or lung 09/30/2011   H/O: lung cancer 09/25/2011   Annual physical exam 09/05/2011   IBS -diarrhea  predominant 09/05/2011   COPD (chronic obstructive pulmonary disease) (HCC) 09/05/2011   Osteoporosis 03/01/2010   TOBACCO ABUSE 11/14/2008   Dyslipidemia 05/18/2007   Essential hypertension 05/18/2007    Orientation RESPIRATION BLADDER Height & Weight     Self, Situation, Place  Normal Continent Weight: 147 lb 14.9 oz (67.1 kg) Height:  5' 7 (170.2 cm)  BEHAVIORAL SYMPTOMS/MOOD NEUROLOGICAL BOWEL NUTRITION STATUS      Incontinent Diet (reg)  AMBULATORY STATUS COMMUNICATION OF NEEDS Skin   Limited Assist Verbally Normal                       Personal Care Assistance Level of Assistance  Bathing, Dressing Bathing Assistance: Limited assistance   Dressing Assistance: Limited assistance     Functional Limitations Info  Sight, Speech, Hearing Sight Info: Impaired Hearing Info: Adequate Speech Info: Adequate    SPECIAL CARE FACTORS FREQUENCY  PT (By licensed PT), OT (By licensed OT)     PT Frequency: 5x/wk OT Frequency: 5x/wk            Contractures Contractures Info: Not present    Additional Factors Info  Code Status, Allergies Code Status Info: full code Allergies Info: Tramadol  Hcl  Codeine  Penicillins           Current Medications (02/05/2024):  This is the current hospital active medication list Current Facility-Administered Medications  Medication Dose Route Frequency Provider Last Rate Last Admin   acetaminophen  (TYLENOL ) tablet 1,000 mg  1,000 mg Oral Q6H PRN Cockerham, Alicia M, PA-C  1,000 mg at 02/05/24 0754   aspirin  EC tablet 81 mg  81 mg Oral BID Cockerham, Alicia M, PA-C   81 mg at 02/05/24 1313   cholecalciferol  (VITAMIN D3) 25 MCG (1000 UNIT) tablet 1,000 Units  1,000 Units Oral Daily Cockerham, Alicia M, PA-C   1,000 Units at 02/05/24 1313   fentaNYL  (SUBLIMAZE ) injection 50 mcg  50 mcg Intravenous Q30 min PRN Cockerham, Alicia M, PA-C   50 mcg at 02/03/24 0106   folic acid  (FOLVITE ) tablet 1 mg  1 mg Oral Daily Cockerham, Alicia M,  PA-C   1 mg at 02/05/24 1314   HYDROmorphone  (DILAUDID ) injection 0.5 mg  0.5 mg Intravenous Q4H PRN Cockerham, Alicia M, PA-C       LORazepam  (ATIVAN ) injection 0-4 mg  0-4 mg Intravenous Q12H Cockerham, Alicia M, PA-C       Or   LORazepam  (ATIVAN ) tablet 0-4 mg  0-4 mg Oral Q12H Cockerham, Alicia M, PA-C       LORazepam  (ATIVAN ) tablet 1-4 mg  1-4 mg Oral Q1H PRN Cockerham, Alicia M, PA-C       Or   LORazepam  (ATIVAN ) injection 1-4 mg  1-4 mg Intravenous Q1H PRN Cockerham, Alicia M, PA-C       multivitamin with minerals tablet 1 tablet  1 tablet Oral Daily Cockerham, Alicia M, PA-C   1 tablet at 02/04/24 0809   ondansetron  (ZOFRAN ) injection 4 mg  4 mg Intravenous Q6H Cockerham, Alicia M, PA-C   4 mg at 02/04/24 4540   ondansetron  (ZOFRAN -ODT) disintegrating tablet 4 mg  4 mg Oral Q8H PRN Cockerham, Alicia M, PA-C       oxyCODONE  (Oxy IR/ROXICODONE ) immediate release tablet 2.5 mg  2.5 mg Oral Q4H PRN Cockerham, Alicia M, PA-C       Or   oxyCODONE  (Oxy IR/ROXICODONE ) immediate release tablet 5 mg  5 mg Oral Q4H PRN Cockerham, Alicia M, PA-C   5 mg at 02/05/24 1314   potassium chloride  SA (KLOR-CON  M) CR tablet 40 mEq  40 mEq Oral Once Cockerham, Alicia M, PA-C       sodium chloride  flush (NS) 0.9 % injection 3 mL  3 mL Intravenous Q12H Cockerham, Alicia M, PA-C   3 mL at 02/04/24 2220   thiamine  (VITAMIN B1) tablet 100 mg  100 mg Oral Daily Cockerham, Alicia M, PA-C   100 mg at 02/05/24 1313   Or   thiamine  (VITAMIN B1) injection 100 mg  100 mg Intravenous Daily Cockerham, Alicia M, PA-C       thiamine  (VITAMIN B1) tablet 100 mg  100 mg Oral Daily Cockerham, Alicia M, PA-C   100 mg at 02/05/24 1313   Or   thiamine  (VITAMIN B1) injection 100 mg  100 mg Intravenous Daily Cockerham, Alicia M, PA-C       Vitamin D  (Ergocalciferol ) (DRISDOL ) 1.25 MG (50000 UNIT) capsule 50,000 Units  50,000 Units Oral Q7 days Barbee Lew, MD   50,000 Units at 02/04/24 1148     Discharge  Medications: Please see discharge summary for a list of discharge medications.  Relevant Imaging Results:  Relevant Lab Results:   Additional Information SSN [id number] [place], [place]

## 2024-02-05 NOTE — TOC Progression Note (Signed)
 Transition of Care [facility]) - Progression Note    Patient Details  Name: [name] [name] MRN: [id number] Date of Birth: 06-02-1951  Transition of Care [facility]) CM/SW Contact  Amaryllis Junior, [place] Phone Number: 02/05/2024, 1:46 PM  Clinical Narrative:    Pt recommended for SNF. PASRR obtained and FL2 completed. SNF referrals faxed out; awaiting bed offers.    Expected Discharge Plan: Home w Home Health Services Barriers to Discharge: Continued Medical Work up  Expected Discharge Plan and Services In-house Referral: Clinical Social Work   Post Acute Care Choice: Home Health Living arrangements for the past 2 months: Single Family Home                 DME Arranged: N/A DME Agency: NA       HH Arranged: PT, OT HH Agency: [facility] Date HH Agency Contacted: 02/04/24 Time HH Agency Contacted: 1506 Representative spoke with at [facility] Agency: Loetta Ringer   Social Determinants of Health (SDOH) Interventions SDOH Screenings   Food Insecurity: No Food Insecurity (02/02/2024)  Housing: Low Risk  (02/02/2024)  Transportation Needs: No Transportation Needs (02/02/2024)  Utilities: Not At Risk (02/02/2024)  Alcohol  Screen: Low Risk  (04/22/2023)  Depression (PHQ2-9): Low Risk  (07/13/2023)  Financial Resource Strain: Low Risk  (04/22/2023)  Physical Activity: Inactive (04/22/2023)  Social Connections: Socially Isolated (02/02/2024)  Stress: No Stress Concern Present (04/22/2023)  Tobacco Use: Medium Risk (02/03/2024)  Health Literacy: Adequate Health Literacy (04/22/2023)    Readmission Risk Interventions    02/04/2024    3:04 PM 02/04/2024    1:48 PM  Readmission Risk Prevention Plan  Transportation Screening Complete Complete  PCP or Specialist Appt within 5-7 Days  Complete  Home Care Screening  Complete  Medication Review (RN CM)  Complete

## 2024-02-05 NOTE — Progress Notes (Signed)
 Occupational Therapy Treatment Patient Details Name: [name] [name] MRN: [id number] DOB: [birth year] Today's Date: 02/05/2024   History of present illness [name] [name] is a 73 yo female s/p fall with L femoral neck fx. Underwent L hip ORIF 02/03/24. Now WBAT with no precautions and f/u ortho MD in 2 weeks. PMH: ETOH use, , CKD3a, HTN, HLD,  B12 deficiency, osteoporosis with prior vertebral fracture, dehydration,cognitive impairment,  limited SCLC s/p chemo, radiation, prophylactic cranial radiation, CAD by imaging   OT comments  Patient seen for OT treatment session this afternoon. Allotted ample rest and pain meds active post PT session to get a clear ADL and mobility picture for needs post hospitalization. Patient continues with some pain interference for amb distances in room with RW and assist. Able to access bathroom with RW and assist with chair follow to use commode frame over toilet with rest and then amb back. 15 ft x 2 and see below for status/levels/cueing for safety needed. Patient motivated and using ice, meds and repositioning for pain control. Another PCA in room [place] who reports patient needs to be more mobile than currently for home. Care coordination with team and OT feels patient requires continued inpatient follow up therapy, <3 hours/day to allow for safe discharge. Will continue to follow Acutely.        If plan is discharge home, recommend the following:  A lot of help with walking and/or transfers;A lot of help with bathing/dressing/bathroom;Direct supervision/assist for medications management;Direct supervision/assist for financial management;Assist for transportation;Help with stairs or ramp for entrance;Supervision due to cognitive status   Equipment Recommendations  None recommended by OT       Precautions / Restrictions Precautions Precautions: Fall Restrictions Weight Bearing Restrictions Per Provider Order: No LLE Weight Bearing Per Provider  Order: Weight bearing as tolerated       Mobility Bed Mobility Overal bed mobility:  (was up in recliner and remained)                  Transfers Overall transfer level: Needs assistance Equipment used: Rolling walker (2 wheels) Transfers: Sit to/from Stand, Bed to chair/wheelchair/BSC Sit to Stand: Min assist, From elevated surface     Step pivot transfers: Min assist, Mod assist     General transfer comment: cues for hand, fooot and body placement inside RW, cues for attention and sequencing     Balance Overall balance assessment: Needs assistance Sitting-balance support: No upper extremity supported, Feet supported Sitting balance-Leahy Scale: Good     Standing balance support: Bilateral upper extremity supported Standing balance-Leahy Scale: Poor                             ADL either performed or assessed with clinical judgement   ADL Overall ADL's : Needs assistance/impaired Eating/Feeding: Set up;Sitting   Grooming: Wash/dry hands;Wash/dry face;Oral care;Set up;Sitting                   Toilet Transfer: Minimal assistance;Moderate assistance;Ambulation;BSC/3in1;Regular Toilet;Rolling walker (2 wheels) Toilet Transfer Details (indicate cue type and reason): BSC over toilet Toileting- Clothing Manipulation and Hygiene: Moderate assistance       Functional mobility during ADLs: Minimal assistance;Moderate assistance;Rolling walker (2 wheels) (chair follow and mod cues) General ADL Comments: 15 ft amb with RW to and from bathroom with RW to commode over toilet    Extremity/Trunk Assessment Upper Extremity Assessment Upper Extremity Assessment: Right hand dominant;Overall [facility] for tasks assessed  Lower Extremity Assessment Lower Extremity Assessment: Defer to PT evaluation        Vision   Vision Assessment?: No apparent visual deficits;Wears glasses for reading         Communication Communication Communication: No apparent  difficulties   Cognition Arousal: Alert Behavior During Therapy: Impulsive Cognition: History of cognitive impairments             OT - Cognition Comments: decreased divided attention, masks cog deficits with humor which can distract safety, decreased organization, safety and jusdgement                 Following commands: Impaired Following commands impaired: Follows one step commands with increased time, Follows one step commands inconsistently      Cueing   Cueing Techniques: Verbal cues, Gestural cues        General Comments no edema, dressing on hip intact    Pertinent Vitals/ Pain       Pain Assessment Pain Assessment: 0-10 Pain Score: 6  Pain Location: L hip with movement Pain Descriptors / Indicators: Aching, Sore, Grimacing Pain Intervention(s): Limited activity within patient's tolerance, Monitored during session, Premedicated before session, Repositioned, Ice applied, Relaxation   Frequency  Min 2X/week        Progress Toward Goals  OT Goals(current goals can now be found in the care plan section)  Progress towards OT goals: Progressing toward goals  Acute Rehab OT Goals Patient Stated Goal: to keep progressing for home OT Goal Formulation: With patient/family Time For Goal Achievement: 02/18/24 Potential to Achieve Goals: Good ADL Goals Pt Will Perform Upper Body Bathing: with set-up;sitting Pt Will Perform Upper Body Dressing: with set-up;sitting Pt Will Transfer to Toilet: bedside commode;grab bars;ambulating;with min assist Pt Will Perform Toileting - Clothing Manipulation and hygiene: with min assist;sit to/from stand  Plan         AM-PAC OT 6 Clicks Daily Activity     Outcome Measure   Help from another person eating meals?: A Little Help from another person taking care of personal grooming?: A Little Help from another person toileting, which includes using toliet, bedpan, or urinal?: A Lot Help from another person bathing  (including washing, rinsing, drying)?: A Lot Help from another person to put on and taking off regular upper body clothing?: A Little Help from another person to put on and taking off regular lower body clothing?: A Lot 6 Click Score: 15    End of Session Equipment Utilized During Treatment: Gait belt;Rolling walker (2 wheels)  OT Visit Diagnosis: Unsteadiness on feet (R26.81);Muscle weakness (generalized) (M62.81);History of falling (Z91.81);Pain Pain - Right/Left: Left Pain - part of body: Hip   Activity Tolerance Patient limited by pain   Patient Left in chair;with call bell/phone within reach;with chair alarm set;with family/visitor present   Nurse Communication Mobility status;Weight bearing status        Time: 1306-1340 OT Time Calculation (min): 34 min  Charges: OT General Charges $OT Visit: 1 Visit OT Treatments $Self Care/Home Management : 23-37 mins  Vantasia Pinkney OT/L Acute Rehabilitation Department  [phone]  02/05/2024, 2:16 PM

## 2024-02-05 NOTE — Progress Notes (Signed)
 PROGRESS NOTE    MASSIAH LONGANECKER  ZOX:[id number] DOB: 04/14/1951 DOA: 02/02/2024 PCP: [name], MD    Brief Narrative:73 y.o. female with hx of limited SCLC s/p chemo, radiation, prophylactic cranial radiation, CAD by imaging, mild cognitive impairment, alcohol  use disorder, CKD3a, HTN, HLD,  B12 deficiency, osteoporosis with prior vertebral fracture, who presents after ground level fall. Reports mechanical fall trying to get into bed, got tripped up on her dog, this occurred yesterday evening. Today with worsening pain and inability to transfer prompting ED visit. She has chronic numbness in her feet, no new numbness or weakness. Reports L hip pain is the worst but also c/o pain in the R hip. Denies hitting her head, is not on anticoagulants. No other areas of injury. No syncope. Otherwise reports 1 day of N/V/D and limited POs. Reports ongoing heavy alcohol  use with 1-2 bottles of wine daily     Assessment & Plan:   Principal Problem:   Fracture of femoral neck, left, closed (HCC) Active Problems:   Cognitive impairment   Alcohol  use disorder   Dehydration   Closed subcapital fracture of left femur (HCC)   Left femoral neck fracture Ground-level fall, mechanical  Osteoporosis-she is status post cannulated screw fixation of the valgus impacted femoral neck fracture 02/03/2024. 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 Has seen Dr. Liam Redhead and no evidence of recurrent disease. On surveillance outpatient.   CAD by  imaging/HLD: Holding aspirin . On repatha  outpatient. Continue rosuvastatin .   mild cognitive impairment: stable  CKD3a: Baseline Cr ~ 0.8 - 1.   HTN: Not on antihypertensives. Bp 153/90   B12 deficiency:  b 12 578  Hypokalemia potassium 3.4 replete mag 2.3    Estimated body mass index is 23.17 kg/m as calculated from the following:   Height as of this encounter: 5' 7 (1.702 m).   Weight as of this encounter: 67.1 kg.  DVT prophylaxis: aspirin  Code Status:full Family Communication: Caregiver at bedside Disposition Plan:  Status is: Inpatient Remains inpatient appropriate because: Acute illness   Consultants:  Ortho  Procedures: Left hip screw fixation 6/11 Antimicrobials: None Subjective:  Anxious to go home   Objective: Vitals:   02/04/24 0910 02/04/24 1349 02/04/24 2028 02/05/24 0542  BP: (!) 146/93 (!) 153/92 131/73 (!) 129/94  Pulse: (!) 108 100 (!) 103 (!) 108  Resp:  17 17 16   Temp: 98.9 F (37.2 C) 98.7 F (37.1 C) 98.6 F (37 C) 98.7 F (37.1 C)  TempSrc: Oral Oral Oral Oral  SpO2: 96% 95% 94% 93%  Weight:      Height:        Intake/Output Summary (Last 24 hours) at 02/05/2024 1013 Last data filed at 02/05/2024 0600 Gross per 24 hour  Intake 420 ml  Output 1600 ml  Net -1180 ml   Filed Weights   02/02/24 1907 02/03/24 0002  Weight: 68 kg 67.1 kg    Examination:  General exam: Appears in no acute distress Respiratory system: Clear to auscultation. Respiratory effort normal. Cardiovascular system: S1 & S2  heard, RRR. No JVD, murmurs, rubs, gallops or clicks. No pedal edema. Gastrointestinal system: Abdomen is nondistended, soft and nontender. No organomegaly or masses felt. Normal bowel sounds heard. Central nervous system: Awake oriented to hospital  extremities: Left lower extremity edema Incision cdi  Data Reviewed: I have personally reviewed following labs and imaging studies  CBC: Recent Labs  Lab 02/02/24 1735 02/03/24 0329  02/04/24 0335 02/05/24 0654  WBC 10.4 7.9 7.8 5.5  NEUTROABS 9.1*  --   --   --   HGB 11.9* 11.0* 10.9* 9.7*  HCT 36.7 33.9* 34.3* 30.7*  MCV 96.3 97.7 98.6 98.1  PLT 136* 128* 130* 120*   Basic Metabolic Panel: Recent Labs  Lab 02/02/24 1735 02/03/24 0329 02/04/24 0335 02/05/24 0654  NA 134* 136 135 137  K 3.9 3.4* 3.4* 3.2*  CL 102 106 102 102  CO2 21* 22 21* 24  GLUCOSE 117* 102* 113* 87  BUN 16 15 12 13   CREATININE 1.05* 1.08* 1.01* 0.92  CALCIUM  9.2 8.7* 8.5* 8.4*  MG  --  2.3  --   --   PHOS  --  3.0  --   --    GFR: Estimated Creatinine Clearance: 55.3 mL/min (by C-G formula based on SCr of 0.92 mg/dL). Liver Function Tests: Recent Labs  Lab 02/04/24 0335 02/05/24 0654  AST 18 15  ALT 13 8  ALKPHOS 43 40  BILITOT 0.5 0.7  PROT 5.9* 5.3*  ALBUMIN 3.0* 2.7*   No results for input(s): LIPASE, AMYLASE in the last 168 hours. No results for input(s): AMMONIA in the last 168 hours. Coagulation Profile: Recent Labs  Lab 02/02/24 1735  INR 1.0   Cardiac Enzymes: No results for input(s): CKTOTAL, CKMB, CKMBINDEX, TROPONINI in the last 168 hours. BNP (last 3 results) No results for input(s): PROBNP in the last 8760 hours. HbA1C: No results for input(s): HGBA1C in the last 72 hours. CBG: No results for input(s): GLUCAP in the last 168 hours. Lipid Profile: No results for input(s): CHOL, HDL, LDLCALC, TRIG, CHOLHDL, LDLDIRECT in the last 72 hours. Thyroid  Function Tests: No results for input(s): TSH, T4TOTAL, FREET4, T3FREE, THYROIDAB in the last 72 hours. Anemia Panel: Recent Labs    02/03/24 0329  VITAMINB12 578   Sepsis Labs: No results for input(s): PROCALCITON, LATICACIDVEN in the last 168 hours.  Recent Results (from the past 240 hours)  Surgical PCR screen     Status: None   Collection Time: 02/03/24  2:44 AM   Specimen: Nasal Mucosa; Nasal Swab  Result Value Ref Range Status   MRSA, PCR  NEGATIVE NEGATIVE Final   Staphylococcus aureus NEGATIVE NEGATIVE Final    Comment: (NOTE) The Xpert SA Assay (FDA approved for NASAL specimens in patients 4 years of age and older), is one component of a comprehensive surveillance program. It is not intended to diagnose infection nor to guide or monitor treatment. Performed at [facility], [address], [place], [place] [address]          Radiology Studies: DG FEMUR PORT MIN 2 VIEWS LEFT Result Date: 02/03/2024 CLINICAL DATA:  Postoperative state left femur. EXAM: LEFT FEMUR PORTABLE 2 VIEWS COMPARISON:  CT left hip 02/02/2024 FINDINGS: New ORIF of the previously seen subcapital proximal left femoral fracture with spell 3 longitudinal screws. Minimal inferior displacement of the distal fracture component with respect to the proximal fracture component is similar to prior. No knee joint effusion. Minimal chronic enthesopathic change at the quadriceps insertion  on the patella. IMPRESSION: New ORIF of the previously seen subcapital proximal left femoral fracture. Electronically Signed   By: Bertina Broccoli M.D.   On: 02/03/2024 13:46   DG HIP UNILAT WITH PELVIS 2-3 VIEWS LEFT Result Date: 02/03/2024 CLINICAL DATA:  Elective surgery.  Intraoperative left hip pinning. EXAM: DG HIP (WITH OR WITHOUT PELVIS) 2-3V LEFT COMPARISON:  Pelvis and left hip radiographs 02/02/2024, CT left hip 02/02/2024 FINDINGS: Images were performed intraoperatively without the presence of a radiologist. 3 longitudinal screw fixation of the proximal left femoral head and neck, fixating the previously seen subcapital fracture. Total fluoroscopy images: 9 Total fluoroscopy time: 54 seconds Total dose: Radiation Exposure Index (as provided by the fluoroscopic device): 7.9 mGy air Kerma Please see intraoperative findings for further detail. IMPRESSION: Intraoperative fluoroscopy for left hip pinning. Electronically Signed   By: Bertina Broccoli M.D.   On:  02/03/2024 12:10   DG C-Arm 1-60 Min-No Report Result Date: 02/03/2024 Fluoroscopy was utilized by the requesting physician.  No radiographic interpretation.   Scheduled Meds:  aspirin  EC  81 mg Oral BID   cholecalciferol   1,000 Units Oral Daily   folic acid   1 mg Oral Daily   LORazepam   0-4 mg Intravenous Q12H   Or   LORazepam   0-4 mg Oral Q12H   multivitamin with minerals  1 tablet Oral Daily   ondansetron  (ZOFRAN ) IV  4 mg Intravenous Q6H   potassium chloride   40 mEq Oral Once   sodium chloride  flush  3 mL Intravenous Q12H   thiamine   100 mg Oral Daily   Or   thiamine   100 mg Intravenous Daily   thiamine   100 mg Oral Daily   Or   thiamine   100 mg Intravenous Daily   Vitamin D  (Ergocalciferol )  50,000 Units Oral Q7 days   Continuous Infusions:   LOS: 3 days    Time spent: 38 min  Barbee Lew, MD  02/05/2024, 10:13 AM

## 2024-02-06 DIAGNOSIS — S72012A Unspecified intracapsular fracture of left femur, initial encounter for closed fracture: Secondary | ICD-10-CM | POA: Diagnosis not present

## 2024-02-06 LAB — URINALYSIS, ROUTINE W REFLEX MICROSCOPIC
Bilirubin Urine: NEGATIVE
Glucose, UA: NEGATIVE mg/dL
Hgb urine dipstick: NEGATIVE
Ketones, ur: 5 mg/dL — AB
Leukocytes,Ua: NEGATIVE
Nitrite: NEGATIVE
Protein, ur: NEGATIVE mg/dL
Specific Gravity, Urine: 1.01 (ref 1.005–1.030)
pH: 6 (ref 5.0–8.0)

## 2024-02-06 MED ORDER — ACETAMINOPHEN 500 MG PO TABS
1000.0000 mg | ORAL_TABLET | Freq: Three times a day (TID) | ORAL | Status: DC
  Administered 2024-02-07 – 2024-02-09 (×6): 1000 mg via ORAL
  Filled 2024-02-06 (×10): qty 2

## 2024-02-06 MED ORDER — POTASSIUM CHLORIDE CRYS ER 20 MEQ PO TBCR
40.0000 meq | EXTENDED_RELEASE_TABLET | Freq: Two times a day (BID) | ORAL | Status: AC
  Administered 2024-02-06 – 2024-02-07 (×3): 40 meq via ORAL
  Filled 2024-02-06 (×4): qty 2

## 2024-02-06 NOTE — Progress Notes (Signed)
 Physical Therapy Treatment Patient Details Name: [name] [name] MRN: [id number] DOB: May 16, 1951 Today's Date: 02/06/2024   History of Present Illness Pricilla Saline is a 73 yo female s/p fall with L femoral neck fx. Underwent L hip ORIF 02/03/24. Now WBAT with no precautions and f/u ortho MD in 2 weeks. PMH: ETOH use, , CKD3a, HTN, HLD,  B12 deficiency, osteoporosis with prior vertebral fracture, dehydration,cognitive impairment,  limited SCLC s/p chemo, radiation, prophylactic cranial radiation, CAD by imaging    PT Comments  Pt in good spirits and with noted improvement in activity tolerance.  Pt up to ambulate 10' before becoming incontinent and transferred to [facility].  Pt up from [facility] and ambulated additional 15'.  Pt continues to require significant assist for safe performance of all basic tasks and Patient will benefit from continued inpatient follow up therapy, <3 hours/day    If plan is discharge home, recommend the following: A lot of help with walking and/or transfers;A little help with bathing/dressing/bathroom;Assistance with cooking/housework;Assist for transportation;Help with stairs or ramp for entrance   Can travel by private vehicle     No  Equipment Recommendations  None recommended by PT    Recommendations for Other Services       Precautions / Restrictions Precautions Precautions: Fall Restrictions Weight Bearing Restrictions Per Provider Order: No LLE Weight Bearing Per Provider Order: Weight bearing as tolerated     Mobility  Bed Mobility Overal bed mobility: Needs Assistance Bed Mobility: Supine to Sit     Supine to sit: Min assist, Mod assist Sit to supine: Mod assist, +2 for physical assistance, +2 for safety/equipment   General bed mobility comments: Increased time with cues for sequence and use of R LE to self assist; physical assist to manage L LE, to control trunk and to complete rotation using bed pad    Transfers Overall transfer  level: Needs assistance Equipment used: Rolling walker (2 wheels) Transfers: Sit to/from Stand Sit to Stand: Min assist, +2 safety/equipment           General transfer comment: cues for LE management and use of UEs self assist. Physical assist to bring wt up and fwd and to balance in initial standing with RW    Ambulation/Gait Ambulation/Gait assistance: +2 safety/equipment, Min assist Gait Distance (Feet): 15 Feet (and 10') Assistive device: Rolling walker (2 wheels) Gait Pattern/deviations: Step-to pattern, Decreased step length - right, Decreased step length - left, Shuffle, Trunk flexed Gait velocity: decr     General Gait Details: Increased time with cues for posture, sequence and position from RW; initial distance ltd by bladder incontinence   Stairs             Wheelchair Mobility     Tilt Bed    Modified Rankin (Stroke Patients Only)       Balance Overall balance assessment: Needs assistance Sitting-balance support: No upper extremity supported, Feet supported Sitting balance-Leahy Scale: Good     Standing balance support: Bilateral upper extremity supported Standing balance-Leahy Scale: Poor                              Communication Communication Communication: No apparent difficulties  Cognition Arousal: Alert Behavior During Therapy: Impulsive   PT - Cognitive impairments: History of cognitive impairments, Safety/Judgement                         Following commands: Impaired Following commands  impaired: Follows one step commands with increased time, Follows one step commands inconsistently    Cueing Cueing Techniques: Verbal cues, Gestural cues  Exercises      General Comments        Pertinent Vitals/Pain Pain Assessment Pain Assessment: 0-10 Pain Score: 4  Pain Location: L hip with movement Pain Descriptors / Indicators: Aching, Sore, Grimacing Pain Intervention(s): Limited activity within patient's  tolerance, Monitored during session, Premedicated before session, Ice applied    Home Living                          Prior Function            PT Goals (current goals can now be found in the care plan section) Acute Rehab PT Goals Patient Stated Goal: HOME to my dog PT Goal Formulation: With patient Time For Goal Achievement: 02/11/24 Potential to Achieve Goals: Fair Progress towards PT goals: Progressing toward goals    Frequency    Min 5X/week      PT Plan      Co-evaluation              AM-PAC PT 6 Clicks Mobility   Outcome Measure  Help needed turning from your back to your side while in a flat bed without using bedrails?: A Lot Help needed moving from lying on your back to sitting on the side of a flat bed without using bedrails?: A Lot Help needed moving to and from a bed to a chair (including a wheelchair)?: A Lot Help needed standing up from a chair using your arms (e.g., wheelchair or bedside chair)?: A Lot Help needed to walk in hospital room?: A Little Help needed climbing 3-5 steps with a railing? : Total 6 Click Score: 12    End of Session Equipment Utilized During Treatment: Gait belt Activity Tolerance: Patient tolerated treatment well Patient left: in chair;with call bell/phone within reach;with chair alarm set;with family/visitor present Nurse Communication: Mobility status PT Visit Diagnosis: Unsteadiness on feet (R26.81);Difficulty in walking, not elsewhere classified (R26.2);Pain Pain - Right/Left: Left Pain - part of body: Hip     Time: 6045-4098 PT Time Calculation (min) (ACUTE ONLY): 32 min  Charges:    $Gait Training: 8-22 mins $Therapeutic Activity: 8-22 mins PT General Charges $$ ACUTE PT VISIT: 1 Visit                     Thedora Finlay PT Acute Rehabilitation Services Pager [phone] Office [phone]    Jaquavius Hudler 02/06/2024, 3:44 PM

## 2024-02-06 NOTE — TOC Progression Note (Signed)
 Transition of Care [facility]) - Progression Note    Patient Details  Name: [name] [name] MRN: [id number] Date of Birth: 06/13/1951  [facility] of [facility]) CM/SW Contact  Amaryllis Junior, [place] Phone Number: 02/06/2024, 11:17 AM  Clinical Narrative:    CSW attempted to review bed offers with pt. Pt is adamant about not going to SNF and returning home to her dog. MD notified of conversation.    Expected Discharge Plan: Home w Home Health Services Barriers to Discharge: Continued Medical Work up  Expected Discharge Plan and Services In-house Referral: Clinical Social Work   Post Acute Care Choice: Home Health Living arrangements for the past 2 months: Single Family Home                 DME Arranged: N/A DME Agency: NA       HH Arranged: PT, OT HH Agency: [facility] Date HH Agency Contacted: 02/04/24 Time HH Agency Contacted: 1506 Representative spoke with at [facility] Agency: Loetta Ringer   Social Determinants of Health (SDOH) Interventions SDOH Screenings   Food Insecurity: No Food Insecurity (02/02/2024)  Housing: Low Risk  (02/02/2024)  Transportation Needs: No Transportation Needs (02/02/2024)  Utilities: Not At Risk (02/02/2024)  Alcohol  Screen: Low Risk  (04/22/2023)  Depression (PHQ2-9): Low Risk  (07/13/2023)  Financial Resource Strain: Low Risk  (04/22/2023)  Physical Activity: Inactive (04/22/2023)  Social Connections: Socially Isolated (02/02/2024)  Stress: No Stress Concern Present (04/22/2023)  Tobacco Use: Medium Risk (02/03/2024)  Health Literacy: Adequate Health Literacy (04/22/2023)    Readmission Risk Interventions    02/04/2024    3:04 PM 02/04/2024    1:48 PM  Readmission Risk Prevention Plan  Transportation Screening Complete Complete  PCP or Specialist Appt within 5-7 Days  Complete  Home Care Screening  Complete  Medication Review (RN CM)  Complete

## 2024-02-06 NOTE — Progress Notes (Signed)
 Physical Therapy Treatment Patient Details Name: TANETTA FUHRIMAN MRN: [id number] DOB: [birth year] Today's Date: 02/06/2024   History of Present Illness Pricilla Westrup is a 73 yo female s/p fall with L femoral neck fx. Underwent L hip ORIF 02/03/24. Now WBAT with no precautions and f/u ortho MD in 2 weeks. PMH: ETOH use, , CKD3a, HTN, HLD,  B12 deficiency, osteoporosis with prior vertebral fracture, dehydration,cognitive impairment,  limited SCLC s/p chemo, radiation, prophylactic cranial radiation, CAD by imaging    PT Comments  Pt with noted increased confusion this pm (did not know who PT was and has every day since first visit).  With increased time and cues, pt assisted from chair to ambulate 4' to return to bed.  Pt with multiple attempts to sit unsafely before being settled on bed side and requiring mod assist of two to prevent falling.  RN aware.    If plan is discharge home, recommend the following: A lot of help with walking and/or transfers;A little help with bathing/dressing/bathroom;Assistance with cooking/housework;Assist for transportation;Help with stairs or ramp for entrance   Can travel by private vehicle     No  Equipment Recommendations  None recommended by PT    Recommendations for Other Services       Precautions / Restrictions Precautions Precautions: Fall Restrictions Weight Bearing Restrictions Per Provider Order: No LLE Weight Bearing Per Provider Order: Weight bearing as tolerated     Mobility  Bed Mobility Overal bed mobility: Needs Assistance Bed Mobility: Sit to Supine     Supine to sit: Min assist, Mod assist Sit to supine: Mod assist, +2 for physical assistance, +2 for safety/equipment   General bed mobility comments: Increased time with cues for sequence and use of R LE to self assist; physical assist to manage L LE, to control trunk and to complete rotation using bed pad    Transfers Overall transfer level: Needs  assistance Equipment used: Rolling walker (2 wheels) Transfers: Sit to/from Stand Sit to Stand: Min assist, Mod assist   Step pivot transfers: Min assist, +2 physical assistance, +2 safety/equipment       General transfer comment: cues for LE management and use of UEs self assist. Physical assist to bring wt up and fwd and to balance in initial standing with RW    Ambulation/Gait Ambulation/Gait assistance: +2 physical assistance, Mod assist Gait Distance (Feet): 4 Feet Assistive device: Rolling walker (2 wheels) Gait Pattern/deviations: Step-to pattern, Decreased step length - right, Decreased step length - left, Shuffle, Trunk flexed Gait velocity: decr     General Gait Details: Increased time with cues for posture, sequence and position from RW; pt demonstrating unsafe behaviors and attempting to sit multiple times with nothing behind her   Stairs             Wheelchair Mobility     Tilt Bed    Modified Rankin (Stroke Patients Only)       Balance Overall balance assessment: Needs assistance Sitting-balance support: No upper extremity supported, Feet supported Sitting balance-Leahy Scale: Good     Standing balance support: Bilateral upper extremity supported Standing balance-Leahy Scale: Poor                              Communication Communication Communication: No apparent difficulties  Cognition Arousal: Alert Behavior During Therapy: Impulsive   PT - Cognitive impairments: History of cognitive impairments, Safety/Judgement  PT - Cognition Comments: INcreased impulsivity and unsafe behaviors this pm Following commands: Impaired Following commands impaired: Follows one step commands with increased time, Follows one step commands inconsistently    Cueing Cueing Techniques: Verbal cues, Gestural cues  Exercises      General Comments        Pertinent Vitals/Pain Pain Assessment Pain Assessment:  Faces Pain Score: 4  Faces Pain Scale: Hurts even more Pain Location: L hip with movement Pain Descriptors / Indicators: Aching, Sore, Grimacing Pain Intervention(s): Limited activity within patient's tolerance, Monitored during session (pt not premedicated 2* increased confusion)    Home Living                          Prior Function            PT Goals (current goals can now be found in the care plan section) Acute Rehab PT Goals Patient Stated Goal: HOME to my dog PT Goal Formulation: With patient Time For Goal Achievement: 02/11/24 Potential to Achieve Goals: Fair Progress towards PT goals: Not progressing toward goals - comment    Frequency    Min 5X/week      PT Plan      Co-evaluation              AM-PAC PT 6 Clicks Mobility   Outcome Measure  Help needed turning from your back to your side while in a flat bed without using bedrails?: A Lot Help needed moving from lying on your back to sitting on the side of a flat bed without using bedrails?: A Lot Help needed moving to and from a bed to a chair (including a wheelchair)?: A Lot Help needed standing up from a chair using your arms (e.g., wheelchair or bedside chair)?: A Lot Help needed to walk in hospital room?: Total Help needed climbing 3-5 steps with a railing? : Total 6 Click Score: 10    End of Session Equipment Utilized During Treatment: Gait belt Activity Tolerance: Patient tolerated treatment well Patient left: in bed;with call bell/phone within reach;with family/visitor present;with bed alarm set Nurse Communication: Mobility status PT Visit Diagnosis: Unsteadiness on feet (R26.81);Difficulty in walking, not elsewhere classified (R26.2);Pain Pain - Right/Left: Left Pain - part of body: Hip     Time: 8119-1478 PT Time Calculation (min) (ACUTE ONLY): 17 min  Charges:    $Gait Training: 8-22 mins $Therapeutic Activity: 8-22 mins PT General Charges $$ ACUTE PT VISIT: 1  Visit                     Thedora Finlay PT Acute Rehabilitation Services Pager [phone] Office [phone]    Fredis Malkiewicz 02/06/2024, 3:52 PM

## 2024-02-06 NOTE — Progress Notes (Signed)
 PROGRESS NOTE    [name] [name]  EAV:[id number] DOB: [birth year] DOA: 02/02/2024 PCP: [name], MD    Brief Narrative:72 y.o. female with hx of limited SCLC s/p chemo, radiation, prophylactic cranial radiation, CAD by imaging, mild cognitive impairment, alcohol  use disorder, CKD3a, HTN, HLD,  B12 deficiency, osteoporosis with prior vertebral fracture, who presents after ground level fall. Reports mechanical fall trying to get into bed, got tripped up on her dog, this occurred yesterday evening. Today with worsening pain and inability to transfer prompting ED visit. She has chronic numbness in her feet, no new numbness or weakness. Reports L hip pain is the worst but also c/o pain in the R hip. Denies hitting her head, is not on anticoagulants. No other areas of injury. No syncope. Otherwise reports 1 day of N/V/D and limited POs. Reports ongoing heavy alcohol  use with 1-2 bottles of wine daily     Assessment & Plan:   Principal Problem:   Fracture of femoral neck, left, closed (HCC) Active Problems:   Cognitive impairment   Alcohol  use disorder   Dehydration   Closed subcapital fracture of left femur (HCC)   Left femoral neck fracture Ground-level fall, mechanical  Osteoporosis-she is status post cannulated screw fixation of the valgus impacted femoral neck fracture 02/03/2024. She is weightbearing as tolerated DVT prophylaxis with aspirin  81 mg twice a day for 4 weeks per Ortho Follow-up with Ortho 2 weeks after surgery Pain control with Tylenol , oxycodone  Vitamin D  level 23.71 Continue vitamin D  repletion She is still adamant about going home however her caregivers are still not comfortable taking her home and caring for her when she needs more help.  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 Has seen Dr. Liam Redhead and no evidence of recurrent disease. On surveillance outpatient.   CAD by imaging/HLD: Holding aspirin . On repatha  outpatient. Continue rosuvastatin .   mild cognitive impairment: stable  CKD3a: Baseline Cr ~ 0.8 - 1.   HTN: Not on antihypertensives. Bp 153/90   B12 deficiency:  b 12 578  Hypokalemia replete potassium  Estimated body mass index is 23.17 kg/m as calculated from the following:   Height as of this encounter: 5' 7 (1.702 m).   Weight as of this encounter: 67.1 kg.  DVT prophylaxis: aspirin  Code Status:full Family Communication: Caregiver at bedside Disposition Plan:  Status is: Inpatient Remains inpatient appropriate because: Acute illness   Consultants:  Ortho  Procedures: Left hip screw fixation 6/11 Antimicrobials: None Subjective:  Anxious to go home   Objective: Vitals:   02/05/24 0542 02/05/24 1347 02/05/24 2047 02/06/24 0613  BP: (!) 129/94 (!) 147/98 (!) 144/84 130/83  Pulse: (!) 108 (!) 108 (!) 103 100  Resp: 16 17 16 15   Temp: 98.7 F (37.1 C) 98.7 F (37.1 C) 98.8 F (37.1 C) 98 F (36.7 C)  TempSrc: Oral Oral Oral Oral  SpO2: 93% 99% 96% 94%  Weight:      Height:        Intake/Output Summary (Last 24 hours) at 02/06/2024 1209 Last data filed at 02/06/2024 0900 Gross per 24 hour  Intake 420 ml  Output 650 ml  Net -230 ml   Filed Weights   02/02/24 1907  02/03/24 0002  Weight: 68 kg 67.1 kg    Examination:  General exam: Appears in no acute distress Respiratory system: Clear to auscultation. Respiratory effort normal. Cardiovascular system: S1 & S2 heard, RRR. No JVD, murmurs, rubs, gallops or clicks. No pedal edema. Gastrointestinal system: Abdomen is nondistended, soft and nontender. No organomegaly or masses felt. Normal bowel sounds heard. Central nervous system: Awake oriented to  hospital  extremities: Left lower extremity edema Incision cdi  Data Reviewed: I have personally reviewed following labs and imaging studies  CBC: Recent Labs  Lab 02/02/24 1735 02/03/24 0329 02/04/24 0335 02/05/24 0654  WBC 10.4 7.9 7.8 5.5  NEUTROABS 9.1*  --   --   --   HGB 11.9* 11.0* 10.9* 9.7*  HCT 36.7 33.9* 34.3* 30.7*  MCV 96.3 97.7 98.6 98.1  PLT 136* 128* 130* 120*   Basic Metabolic Panel: Recent Labs  Lab 02/02/24 1735 02/03/24 0329 02/04/24 0335 02/05/24 0654  NA 134* 136 135 137  K 3.9 3.4* 3.4* 3.2*  CL 102 106 102 102  CO2 21* 22 21* 24  GLUCOSE 117* 102* 113* 87  BUN 16 15 12 13   CREATININE 1.05* 1.08* 1.01* 0.92  CALCIUM  9.2 8.7* 8.5* 8.4*  MG  --  2.3  --   --   PHOS  --  3.0  --   --    GFR: Estimated Creatinine Clearance: 55.3 mL/min (by C-G formula based on SCr of 0.92 mg/dL). Liver Function Tests: Recent Labs  Lab 02/04/24 0335 02/05/24 0654  AST 18 15  ALT 13 8  ALKPHOS 43 40  BILITOT 0.5 0.7  PROT 5.9* 5.3*  ALBUMIN 3.0* 2.7*   No results for input(s): LIPASE, AMYLASE in the last 168 hours. No results for input(s): AMMONIA in the last 168 hours. Coagulation Profile: Recent Labs  Lab 02/02/24 1735  INR 1.0   Cardiac Enzymes: No results for input(s): CKTOTAL, CKMB, CKMBINDEX, TROPONINI in the last 168 hours. BNP (last 3 results) No results for input(s): PROBNP in the last 8760 hours. HbA1C: No results for input(s): HGBA1C in the last 72 hours. CBG: No results for input(s): GLUCAP in the last 168 hours. Lipid Profile: No results for input(s): CHOL, HDL, LDLCALC, TRIG, CHOLHDL, LDLDIRECT in the last 72 hours. Thyroid  Function Tests: No results for input(s): TSH, T4TOTAL, FREET4, T3FREE, THYROIDAB in the last 72 hours. Anemia Panel: No results for input(s): VITAMINB12, FOLATE, FERRITIN, TIBC, IRON, RETICCTPCT in the last 72 hours.  Sepsis Labs: No results for input(s):  PROCALCITON, LATICACIDVEN in the last 168 hours.  Recent Results (from the past 240 hours)  Surgical PCR screen     Status: None   Collection Time: 02/03/24  2:44 AM   Specimen: Nasal Mucosa; Nasal Swab  Result Value Ref Range Status   MRSA, PCR NEGATIVE NEGATIVE Final   Staphylococcus aureus NEGATIVE NEGATIVE Final    Comment: (NOTE) The Xpert SA Assay (FDA approved for NASAL specimens in patients 63 years of age and older), is one component of a comprehensive surveillance program. It is not intended to diagnose infection nor to guide or monitor treatment. Performed at [facility] [address], [place], [place] [address]          Radiology Studies: No results found.  Scheduled Meds:  aspirin  EC  81 mg Oral BID   cholecalciferol   1,000 Units Oral Daily   folic acid   1 mg Oral Daily   LORazepam   0-4 mg Oral Q12H  multivitamin with minerals  1 tablet Oral Daily   ondansetron  (ZOFRAN ) IV  4 mg Intravenous Q6H   potassium chloride   40 mEq Oral Once   potassium chloride   40 mEq Oral BID   thiamine   100 mg Oral Daily   Or   thiamine   100 mg Intravenous Daily   Vitamin D  (Ergocalciferol )  50,000 Units Oral Q7 days   Continuous Infusions:   LOS: 4 days    Time spent: 38 min  Barbee Lew, MD  02/06/2024, 12:09 PM

## 2024-02-06 NOTE — Plan of Care (Signed)
 Patient noted to have intermittent, increased confusion noted by staff and paid caregiver at bedside. Neuro check - PERRLA, grips equal bilaterally, tongue midline. Dr. Augustus Ledger notified and new orders obtained. Urine sent for U/A.  Problem: Clinical Measurements: Goal: Ability to maintain clinical measurements within normal limits will improve Outcome: Progressing   Problem: Activity: Goal: Risk for activity intolerance will decrease Outcome: Progressing   Problem: Elimination: Goal: Will not experience complications related to bowel motility Outcome: Progressing   Problem: Pain Managment: Goal: General experience of comfort will improve and/or be controlled Outcome: Progressing   Problem: Safety: Goal: Ability to remain free from injury will improve Outcome: Progressing   Ara Knee, RN 02/06/24 5:50 PM

## 2024-02-07 DIAGNOSIS — S72012A Unspecified intracapsular fracture of left femur, initial encounter for closed fracture: Secondary | ICD-10-CM | POA: Diagnosis not present

## 2024-02-07 LAB — CBC
HCT: 31.5 % — ABNORMAL LOW (ref 36.0–46.0)
Hemoglobin: 10.2 g/dL — ABNORMAL LOW (ref 12.0–15.0)
MCH: 31.9 pg (ref 26.0–34.0)
MCHC: 32.4 g/dL (ref 30.0–36.0)
MCV: 98.4 fL (ref 80.0–100.0)
Platelets: 163 10*3/uL (ref 150–400)
RBC: 3.2 MIL/uL — ABNORMAL LOW (ref 3.87–5.11)
RDW: 12.5 % (ref 11.5–15.5)
WBC: 6.3 10*3/uL (ref 4.0–10.5)
nRBC: 0 % (ref 0.0–0.2)

## 2024-02-07 LAB — BASIC METABOLIC PANEL WITH GFR
Anion gap: 11 (ref 5–15)
BUN: 10 mg/dL (ref 8–23)
CO2: 26 mmol/L (ref 22–32)
Calcium: 8.6 mg/dL — ABNORMAL LOW (ref 8.9–10.3)
Chloride: 95 mmol/L — ABNORMAL LOW (ref 98–111)
Creatinine, Ser: 0.72 mg/dL (ref 0.44–1.00)
GFR, Estimated: >60 mL/min (ref >60)
Glucose, Bld: 91 mg/dL (ref 70–99)
Potassium: 3.4 mmol/L — ABNORMAL LOW (ref 3.5–5.1)
Sodium: 132 mmol/L — ABNORMAL LOW (ref 135–145)

## 2024-02-07 MED ORDER — RALOXIFENE HCL 60 MG PO TABS
60.0000 mg | ORAL_TABLET | Freq: Every day | ORAL | Status: DC
  Administered 2024-02-07 – 2024-02-12 (×6): 60 mg via ORAL
  Filled 2024-02-07 (×6): qty 1

## 2024-02-07 MED ORDER — CITALOPRAM HYDROBROMIDE 20 MG PO TABS
20.0000 mg | ORAL_TABLET | Freq: Every day | ORAL | Status: DC
  Administered 2024-02-07 – 2024-02-12 (×6): 20 mg via ORAL
  Filled 2024-02-07 (×6): qty 1

## 2024-02-07 MED ORDER — SENNOSIDES-DOCUSATE SODIUM 8.6-50 MG PO TABS
2.0000 | ORAL_TABLET | Freq: Every day | ORAL | Status: DC
  Filled 2024-02-07 (×3): qty 2

## 2024-02-07 MED ORDER — QUETIAPINE FUMARATE 25 MG PO TABS
12.5000 mg | ORAL_TABLET | Freq: Two times a day (BID) | ORAL | Status: DC
  Administered 2024-02-07: 12.5 mg via ORAL
  Filled 2024-02-07: qty 1

## 2024-02-07 MED ORDER — ROSUVASTATIN CALCIUM 20 MG PO TABS
20.0000 mg | ORAL_TABLET | Freq: Every day | ORAL | Status: DC
  Administered 2024-02-07 – 2024-02-12 (×6): 20 mg via ORAL
  Filled 2024-02-07 (×6): qty 1

## 2024-02-07 MED ORDER — GABAPENTIN 100 MG PO CAPS
200.0000 mg | ORAL_CAPSULE | Freq: Two times a day (BID) | ORAL | Status: DC
  Administered 2024-02-07: 200 mg via ORAL
  Filled 2024-02-07 (×3): qty 2

## 2024-02-07 MED ORDER — POLYETHYLENE GLYCOL 3350 17 G PO PACK
17.0000 g | PACK | Freq: Every day | ORAL | Status: DC
  Administered 2024-02-07 – 2024-02-12 (×3): 17 g via ORAL
  Filled 2024-02-07 (×5): qty 1

## 2024-02-07 MED ORDER — LORAZEPAM 0.5 MG PO TABS
0.5000 mg | ORAL_TABLET | Freq: Once | ORAL | Status: AC
  Administered 2024-02-07: 0.5 mg via ORAL

## 2024-02-07 MED ORDER — CARVEDILOL 6.25 MG PO TABS
6.2500 mg | ORAL_TABLET | Freq: Two times a day (BID) | ORAL | Status: DC
  Administered 2024-02-07 – 2024-02-08 (×3): 6.25 mg via ORAL
  Filled 2024-02-07 (×3): qty 1

## 2024-02-07 NOTE — Plan of Care (Signed)

## 2024-02-07 NOTE — Progress Notes (Signed)
 PROGRESS NOTE    [name] [name]  AVW:[id number] DOB: March 15, 1951 DOA: 02/02/2024 PCP: [name], MD    Brief Narrative:73 y.o. female with hx of limited SCLC s/p chemo, radiation, prophylactic cranial radiation, CAD by imaging, mild cognitive impairment, alcohol  use disorder, CKD3a, HTN, HLD,  B12 deficiency, osteoporosis with prior vertebral fracture, who presents after ground level fall. Reports mechanical fall trying to get into bed, got tripped up on her dog, this occurred yesterday evening. Today with worsening pain and inability to transfer prompting ED visit. She has chronic numbness in her feet, no new numbness or weakness. Reports L hip pain is the worst but also c/o pain in the R hip. Denies hitting her head, is not on anticoagulants. No other areas of injury. No syncope. Otherwise reports 1 day of N/V/D and limited POs. Reports ongoing heavy alcohol  use with 1-2 bottles of wine daily     Assessment & Plan:   Principal Problem:   Fracture of femoral neck, left, closed (HCC) Active Problems:   Cognitive impairment   Alcohol  use disorder   Dehydration   Closed subcapital fracture of left femur (HCC)   Left femoral neck fracture Ground-level fall, mechanical  Osteoporosis-she is status post cannulated screw fixation of the valgus impacted femoral neck fracture 02/03/2024. She is weightbearing as tolerated DVT prophylaxis with aspirin  81 mg twice a day for 4 weeks per Ortho Follow-up with Ortho 2 weeks after surgery Pain control with Tylenol , oxycodone  Vitamin D  level 23.71 Continue vitamin D  repletion She is still adamant about going home however her caregivers are still not comfortable taking her home and caring for her when she needs more help.  Hopefully PT can work with her over the weekend  Hospital-acquired delirium -delirium in the setting of etoh use.UA negative no other signs of infection. 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 Has seen Dr. Liam Redhead and no evidence of recurrent disease. On surveillance outpatient.   CAD by imaging/HLD: Holding aspirin . On repatha  outpatient. Continue rosuvastatin .   mild cognitive impairment: stable  CKD3a: Baseline Cr ~ 0.8 - 1.   HTN: Not on antihypertensives. Bp 153/90   B12 deficiency:  b 12 578  Hypokalemia replete potassium  Constipation-senna miralax   Estimated body mass index is 23.17 kg/m as calculated from the following:   Height as of this encounter: 5' 7 (1.702 m).   Weight as of this encounter: 67.1 kg.  DVT prophylaxis: aspirin  Code Status:full Family Communication: Caregiver at bedside Disposition Plan:  Status is: Inpatient Remains inpatient appropriate because: Acute illness   Consultants:  Ortho  Procedures: Left hip screw fixation 6/11 Antimicrobials: None Subjective:  Anxious to go home   Objective: Vitals:   02/06/24 0613 02/06/24 1345 02/06/24 2125 02/07/24 0542  BP: 130/83 136/86 (!) 152/91 (!) 138/94  Pulse: 100 95 (!) 101 98  Resp: 15 19 17 18   Temp: 98 F (36.7 C) 98.4 F (36.9 C) 98 F (36.7 C) 98.2 F (36.8 C)  TempSrc: Oral Oral Oral Oral  SpO2: 94% 96% 94% 95%  Weight:      Height:        Intake/Output Summary (Last 24 hours) at 02/07/2024 0952 Last data filed at 02/07/2024 0542 Gross per 24 hour  Intake 720 ml  Output 1350 ml  Net -630 ml   Filed Weights   02/02/24 1907 02/03/24 0002  Weight: 68 kg 67.1 kg    Examination:  General exam: Appears in no acute distress Respiratory system: Clear to auscultation. Respiratory effort normal. Cardiovascular system: S1 & S2 heard, RRR. No JVD, murmurs, rubs, gallops or clicks. No pedal edema. Gastrointestinal system: Abdomen is  nondistended, soft and nontender. No organomegaly or masses felt. Normal bowel sounds heard. Central nervous system: Awake oriented to hospital  extremities: Left lower extremity edema Incision cdi  Data Reviewed: I have personally reviewed following labs and imaging studies  CBC: Recent Labs  Lab 02/02/24 1735 02/03/24 0329 02/04/24 0335 02/05/24 0654  WBC 10.4 7.9 7.8 5.5  NEUTROABS 9.1*  --   --   --   HGB 11.9* 11.0* 10.9* 9.7*  HCT 36.7 33.9* 34.3* 30.7*  MCV 96.3 97.7 98.6 98.1  PLT 136* 128* 130* 120*   Basic Metabolic Panel: Recent Labs  Lab 02/02/24 1735 02/03/24 0329 02/04/24 0335 02/05/24 0654  NA 134* 136 135 137  K 3.9 3.4* 3.4* 3.2*  CL 102 106 102 102  CO2 21* 22 21* 24  GLUCOSE 117* 102* 113* 87  BUN 16 15 12 13   CREATININE 1.05* 1.08* 1.01* 0.92  CALCIUM  9.2 8.7* 8.5* 8.4*  MG  --  2.3  --   --   PHOS  --  3.0  --   --    GFR: Estimated Creatinine Clearance: 55.3 mL/min (by C-G formula based on SCr of 0.92 mg/dL). Liver Function Tests: Recent Labs  Lab 02/04/24 0335 02/05/24 0654  AST 18 15  ALT 13 8  ALKPHOS 43 40  BILITOT 0.5 0.7  PROT 5.9* 5.3*  ALBUMIN 3.0* 2.7*   No results for input(s): LIPASE, AMYLASE in the last 168 hours. No results for input(s): AMMONIA in the last 168 hours. Coagulation Profile: Recent Labs  Lab 02/02/24 1735  INR 1.0   Cardiac Enzymes: No results for input(s): CKTOTAL, CKMB, CKMBINDEX, TROPONINI in the last 168 hours. BNP (last 3 results) No results for input(s): PROBNP in the last 8760 hours. HbA1C: No results for input(s): HGBA1C in the last 72 hours. CBG: No results for input(s): GLUCAP in the last 168 hours. Lipid Profile: No results for input(s): CHOL, HDL, LDLCALC, TRIG, CHOLHDL, LDLDIRECT in the last 72 hours. Thyroid  Function Tests: No results for input(s): TSH, T4TOTAL, FREET4, T3FREE, THYROIDAB in the last 72 hours. Anemia Panel: No results for  input(s): VITAMINB12, FOLATE, FERRITIN, TIBC, IRON, RETICCTPCT in the last 72 hours.  Sepsis Labs: No results for input(s): PROCALCITON, LATICACIDVEN in the last 168 hours.  Recent Results (from the past 240 hours)  Surgical PCR screen     Status: None   Collection Time: 02/03/24  2:44 AM   Specimen: Nasal Mucosa; Nasal Swab  Result Value Ref Range Status   MRSA, PCR NEGATIVE NEGATIVE Final   Staphylococcus aureus NEGATIVE NEGATIVE Final    Comment: (NOTE) The Xpert SA Assay (FDA approved for NASAL specimens in patients 62 years of age and older), is one component of a comprehensive surveillance program. It is not intended to diagnose infection nor to guide or monitor treatment. Performed at [facility], [address] [place], [place] [address]          Radiology Studies: No results found.  Scheduled Meds:  acetaminophen   1,000 mg Oral TID   aspirin  EC  81 mg Oral  BID   cholecalciferol   1,000 Units Oral Daily   folic acid   1 mg Oral Daily   multivitamin with minerals  1 tablet Oral Daily   ondansetron  (ZOFRAN ) IV  4 mg Intravenous Q6H   polyethylene glycol  17 g Oral Daily   potassium chloride   40 mEq Oral BID   QUEtiapine  12.5 mg Oral BID   senna-docusate  2 tablet Oral QHS   thiamine   100 mg Oral Daily   Or   thiamine   100 mg Intravenous Daily   Vitamin D  (Ergocalciferol )  50,000 Units Oral Q7 days   Continuous Infusions:   LOS: 5 days    Time spent: 38 min  Barbee Lew, MD  02/07/2024, 9:52 AM

## 2024-02-07 NOTE — Progress Notes (Signed)
 PT Cancellation Note  Patient Details Name: AZHA CONSTANTIN MRN: [id number] DOB: 1950/09/07   Cancelled Treatment:     PT deferred this am on advice of RN, pt with increased confusion, attempting to undress and slide out of bed and has just received Ativan .  Will follow.   Jerriyah Louis 02/07/2024, 11:56 AM

## 2024-02-08 ENCOUNTER — Other Ambulatory Visit (HOSPITAL_COMMUNITY): Payer: Self-pay

## 2024-02-08 ENCOUNTER — Inpatient Hospital Stay (HOSPITAL_COMMUNITY)

## 2024-02-08 DIAGNOSIS — S72012A Unspecified intracapsular fracture of left femur, initial encounter for closed fracture: Secondary | ICD-10-CM | POA: Diagnosis not present

## 2024-02-08 LAB — MAGNESIUM: Magnesium: 2.2 mg/dL (ref 1.7–2.4)

## 2024-02-08 MED ORDER — POTASSIUM CHLORIDE 10 MEQ/100ML IV SOLN
10.0000 meq | INTRAVENOUS | Status: DC
  Administered 2024-02-08: 10 meq via INTRAVENOUS
  Filled 2024-02-08: qty 100

## 2024-02-08 MED ORDER — BISACODYL 10 MG RE SUPP
10.0000 mg | Freq: Once | RECTAL | Status: AC
  Administered 2024-02-08: 10 mg via RECTAL
  Filled 2024-02-08: qty 1

## 2024-02-08 MED ORDER — CARVEDILOL 12.5 MG PO TABS
12.5000 mg | ORAL_TABLET | Freq: Two times a day (BID) | ORAL | Status: DC
  Administered 2024-02-08 – 2024-02-12 (×8): 12.5 mg via ORAL
  Filled 2024-02-08 (×8): qty 1

## 2024-02-08 MED ORDER — POTASSIUM CHLORIDE CRYS ER 20 MEQ PO TBCR: 40.0000 meq | EXTENDED_RELEASE_TABLET | Freq: Once | ORAL | Status: DC

## 2024-02-08 MED ORDER — POTASSIUM CHLORIDE 10 MEQ/100ML IV SOLN
10.0000 meq | INTRAVENOUS | Status: DC
  Administered 2024-02-08 (×3): 10 meq via INTRAVENOUS
  Filled 2024-02-08 (×3): qty 100

## 2024-02-08 MED ORDER — HYDROXYZINE HCL 25 MG PO TABS
25.0000 mg | ORAL_TABLET | Freq: Three times a day (TID) | ORAL | Status: DC | PRN
  Administered 2024-02-09 (×2): 25 mg via ORAL
  Filled 2024-02-08 (×2): qty 1

## 2024-02-08 MED ORDER — POTASSIUM CHLORIDE 10 MEQ/100ML IV SOLN
10.0000 meq | INTRAVENOUS | Status: AC
  Administered 2024-02-08 (×2): 10 meq via INTRAVENOUS
  Filled 2024-02-08 (×2): qty 100

## 2024-02-08 MED ORDER — BISACODYL 5 MG PO TBEC
10.0000 mg | DELAYED_RELEASE_TABLET | Freq: Every day | ORAL | Status: DC
  Administered 2024-02-08 – 2024-02-12 (×2): 10 mg via ORAL
  Filled 2024-02-08 (×4): qty 2

## 2024-02-08 NOTE — Progress Notes (Addendum)
 Subjective: Patient reports pain as mild, slept well.  Caretaker Burdette Carolin is at the bedside, provides some of history.  Patient did not have a good day yesterday, better today, smiling.  Some difficulty with PT yesterday due to confusion and fatigue.    Still hopeful with the amount of support she has at home that she may be able to discharge to home depending on her progress with physical therapy.  Chronic numbness in feet, denies new numbness or tingling in leg.  No other complaints.  Objective:   VITALS:   Vitals:   02/07/24 0542 02/07/24 1344 02/07/24 2117 02/08/24 0522  BP: (!) 138/94 105/68 101/68 (!) 146/81  Pulse: 98 91 96 94  Resp: 18 18 18 17   Temp: 98.2 F (36.8 C) 98 F (36.7 C) 98 F (36.7 C) 97.7 F (36.5 C)  TempSrc: Oral  Oral Axillary  SpO2: 95% 98% 90% 95%  Weight:      Height:        AAOx4, resting comfortably in bed in NAD Sensation intact distally Intact pulses distally Dorsiflexion/Plantar flexion intact Incision: dressing C/D/I Compartment soft Wiggles toes appropriately    Lab Results  Component Value Date   WBC 6.3 02/07/2024   HGB 10.2 (L) 02/07/2024   HCT 31.5 (L) 02/07/2024   MCV 98.4 02/07/2024   PLT 163 02/07/2024   BMET    Component Value Date/Time   NA 132 (L) 02/07/2024 1015   NA 143 02/05/2017 0817   K 3.4 (L) 02/07/2024 1015   K 4.3 02/05/2017 0817   CL 95 (L) 02/07/2024 1015   CL 104 12/23/2012 1338   CO2 26 02/07/2024 1015   CO2 25 02/05/2017 0817   GLUCOSE 91 02/07/2024 1015   GLUCOSE 96 02/05/2017 0817   GLUCOSE 85 12/23/2012 1338   BUN 10 02/07/2024 1015   BUN 16.0 02/05/2017 0817   CREATININE 0.72 02/07/2024 1015   CREATININE 1.30 (H) 01/21/2024 1332   CREATININE 1.0 02/05/2017 0817   CALCIUM  8.6 (L) 02/07/2024 1015   CALCIUM  9.5 02/05/2017 0817   EGFR 60 (L) 02/05/2017 0817   GFRNONAA >60 02/07/2024 1015   GFRNONAA 44 (L) 01/21/2024 1332      Xray: PACU x-rays reviewed demonstrate cannulated screw  fixation of valgus impacted femoral neck fracture  Assessment/Plan: 5 Days Post-Op   Principal Problem:   Fracture of femoral neck, left, closed (HCC) Active Problems:   Cognitive impairment   Alcohol  use disorder   Dehydration   Closed subcapital fracture of left femur (HCC)  Status post left hip fracture cannulated screw fixation 02/03/2024.  Repeat imaging 6/16 shows stable post-operative imaging.  6/16: Continue with PT as tolerated.  No significant leukocytosis, Hgb appears stable, HDS.  Appears to have some hospital acquired delirium per hospitalist, noted hx of severe ETOH use.  Repeat x-rays reassuring.   Post op recs: WB: WBAT no precautions Abx: ancef  x23 hours post op Imaging: PACU xrays Dressing: keep intact until follow up, change PRN if soiled or saturated. DVT prophylaxis: ASA 81 mg twice daily x 4 weeks Follow up: 2 weeks after surgery for a wound check with Dr. Pryor Browning at [facility].  Address: [address], [place], [place] [address]  Office Phone: [phone]    Albertus Alt 02/08/2024, 7:27 AM    Contact information:   Weekdays 7am-5pm epic message Dr. Pryor Browning, or call office for patient follow up: [phone] After hours and holidays please check  [email or web address] for group call information for [facility]

## 2024-02-08 NOTE — Progress Notes (Addendum)
 Physical Therapy Treatment Patient Details Name: [name] [name] MRN: [id number] DOB: [birth year] Today's Date: 02/08/2024   History of Present Illness [name] [name] is a 73 yo female s/p fall with L femoral neck fx. Underwent L hip ORIF 02/03/24. Now WBAT with no precautions and f/u ortho MD in 2 weeks. PMH: ETOH use, , CKD3a, HTN, HLD,  B12 deficiency, osteoporosis with prior vertebral fracture, dehydration,cognitive impairment,  limited SCLC s/p chemo, radiation, prophylactic cranial radiation, CAD by imaging    PT Comments  POD # 5 AxO x 1 nervous Lady requiring repeat functional simple commands.  Easily distracted and pain triggering increased anxiety.  Near panic amb to and from the  bathroom requiring + 2 asisst and max comfort/coaxing  Assisted OOB was difficult. General bed mobility comments: needed increased A for movement of BLE and complete scooting to EOB General transfer comment: high anxiety requiring any where from Min to Max Assist and repeat VC's for safety.  Also asissted with a toilet transfer.  Pt was unable to rise from so called for + 2 asisst.  `General Gait Details: assisted with amb from the bed to the bathroom 11  feet was very eventful due to increased anxiety/near panic.  Required repeat instructions to stay on task.  Pt resisting and attempting to sit on the floor.  Max Assist to support and encourage to complte the short distance.  Pt yelling, don't touch me and let me go hoever Pt unable to support self pushing backward.  Required + 2 side by side asisst/support out of bathroom to near by recliner another 11 feet..  Pt attempt to sit before reaching recliner.  HIGH ANXIETY Positioned in recliner to comfort.  Chair alarm active.  Pt calm. Pt will need ST Rehab at SNF to address mobility and functional decline prior to safely returning home.     If plan is discharge home, recommend the following: A lot of help with walking and/or transfers;A little help  with bathing/dressing/bathroom;Assistance with cooking/housework;Assist for transportation;Help with stairs or ramp for entrance   Can travel by private vehicle     No  Equipment Recommendations  None recommended by PT    Recommendations for Other Services       Precautions / Restrictions Precautions Precautions: Fall Precaution/Restrictions Comments: AMS Restrictions Weight Bearing Restrictions Per Provider Order: No LLE Weight Bearing Per Provider Order: Weight bearing as tolerated     Mobility  Bed Mobility Overal bed mobility: Needs Assistance Bed Mobility: Supine to Sit     Supine to sit: Mod assist, Max assist     General bed mobility comments: needed increased A for movement of BLE and complete scooting to EOB    Transfers Overall transfer level: Needs assistance Equipment used: Rolling walker (2 wheels) Transfers: Sit to/from Stand Sit to Stand: Min assist, Mod assist, Max assist           General transfer comment: high anxiety requiring any where from Min to Max Assist and repeat VC's for safety.  Also asissted with a toilet transfer.  Pt was unable to rise from so called for + 2 asisst.  `    Ambulation/Gait Ambulation/Gait assistance: +2 physical assistance, Mod assist Gait Distance (Feet): 22 Feet Assistive device: Rolling walker (2 wheels) Gait Pattern/deviations: Step-to pattern, Decreased step length - right, Decreased step length - left, Shuffle, Trunk flexed Gait velocity: decreased     General Gait Details: assisted with amb from the bed to the bathroom 11  feet was  very eventful due to increased anxiety/near panic.  Required repeat instructions to stay on task.  Pt resisting and attempting to sit on the floor.  Max Assist to support and encourage to complte the short distance.  Pt yelling, don't touch me and let me go hoever Pt unable to support self pushing backward.  Required + 2 side by side asisst/support out of bathroom to near by  recliner.  Pt attempt to sit before reaching recliner.  HIGH ANXIETY   Stairs             Wheelchair Mobility     Tilt Bed    Modified Rankin (Stroke Patients Only)       Balance                                            Communication Communication Communication: No apparent difficulties  Cognition Arousal: Alert Behavior During Therapy: Impulsive   PT - Cognitive impairments: History of cognitive impairments, Safety/Judgement                       PT - Cognition Comments: AxO x 1 nervous Lady requiring repeat functional simple commands.  Easily distracted and pain triggering increased anxiety.  Near panic amb top bathroom requiring + 2 asisst and max comfort/coaxing Following commands: Impaired Following commands impaired: Follows one step commands with increased time, Follows one step commands inconsistently    Cueing Cueing Techniques: Verbal cues, Gestural cues  Exercises      General Comments        Pertinent Vitals/Pain Pain Assessment Pain Assessment: Faces Faces Pain Scale: Hurts even more Pain Location: L hip with activity Pain Descriptors / Indicators: Operative site guarding, Grimacing Pain Intervention(s): Monitored during session    Home Living                          Prior Function            PT Goals (current goals can now be found in the care plan section) Progress towards PT goals: Progressing toward goals    Frequency    Min 5X/week      PT Plan      Co-evaluation              AM-PAC PT 6 Clicks Mobility   Outcome Measure  Help needed turning from your back to your side while in a flat bed without using bedrails?: A Lot Help needed moving from lying on your back to sitting on the side of a flat bed without using bedrails?: A Lot Help needed moving to and from a bed to a chair (including a wheelchair)?: A Lot Help needed standing up from a chair using your arms (e.g.,  wheelchair or bedside chair)?: A Lot Help needed to walk in hospital room?: A Lot Help needed climbing 3-5 steps with a railing? : Total 6 Click Score: 11    End of Session Equipment Utilized During Treatment: Gait belt Activity Tolerance: Other (comment) (anxiety) Patient left: in chair;with call bell/phone within reach;with family/visitor present Nurse Communication: Mobility status PT Visit Diagnosis: Unsteadiness on feet (R26.81);Difficulty in walking, not elsewhere classified (R26.2);Pain Pain - Right/Left: Left Pain - part of body: Hip     Time: 1610-9604 PT Time Calculation (min) (ACUTE ONLY): 29 min  Charges:    $  Gait Training: 8-22 mins $Therapeutic Activity: 8-22 mins PT General Charges $$ ACUTE PT VISIT: 1 Visit                     Bess Broody  PTA Acute  Rehabilitation Services Office M-F          [phone]

## 2024-02-08 NOTE — Plan of Care (Signed)
  Problem: Education: Goal: Knowledge of General Education information will improve Description: Including pain rating scale, medication(s)/side effects and non-pharmacologic comfort measures Outcome: Progressing   Problem: Health Behavior/Discharge Planning: Goal: Ability to manage health-related needs will improve Outcome: Progressing   Problem: Clinical Measurements: Goal: Ability to maintain clinical measurements within normal limits will improve Outcome: Adequate for Discharge Goal: Will remain free from infection Outcome: Progressing Goal: Diagnostic test results will improve Outcome: Adequate for Discharge Goal: Respiratory complications will improve Outcome: Progressing Goal: Cardiovascular complication will be avoided Outcome: Progressing   Problem: Activity: Goal: Risk for activity intolerance will decrease Outcome: Progressing   Problem: Nutrition: Goal: Adequate nutrition will be maintained Outcome: Adequate for Discharge   Problem: Coping: Goal: Level of anxiety will decrease Outcome: Progressing   Problem: Elimination: Goal: Will not experience complications related to bowel motility Outcome: Progressing Goal: Will not experience complications related to urinary retention Outcome: Completed/Met   Problem: Pain Managment: Goal: General experience of comfort will improve and/or be controlled Outcome: Progressing   Problem: Safety: Goal: Ability to remain free from injury will improve Outcome: Progressing   Problem: Skin Integrity: Goal: Risk for impaired skin integrity will decrease Outcome: Progressing   Problem: Education: Goal: Verbalization of understanding the information provided (i.e., activity precautions, restrictions, etc) will improve Outcome: Progressing Goal: Individualized Educational Video(s) Outcome: Completed/Met   Problem: Activity: Goal: Ability to ambulate and perform ADLs will improve Outcome: Progressing   Problem:  Clinical Measurements: Goal: Postoperative complications will be avoided or minimized Outcome: Progressing   Problem: Self-Concept: Goal: Ability to maintain and perform role responsibilities to the fullest extent possible will improve Outcome: Progressing

## 2024-02-08 NOTE — TOC PASRR Note (Signed)
 30 Day PASRR Note   Patient Details  Name: [name] [name] Date of Birth: 1950-09-10   Transition of Care [facility]) CM/SW Contact:    Delilah Fend, LCSW Phone Number: 02/08/2024, 1:10 PM  To Whom It May Concern:  Please be advised that this patient will require a short-term nursing home stay - anticipated 30 days or less for rehabilitation and strengthening.   The plan is for return home.

## 2024-02-08 NOTE — Progress Notes (Signed)
 Occupational Therapy Treatment Patient Details Name: [name] [name] MRN: [id number] DOB: 1951/01/15 Today's Date: 02/08/2024   History of present illness Pricilla Mirabal is a 73 yo female s/p fall with L femoral neck fx. Underwent L hip ORIF 02/03/24. Now WBAT with no precautions and f/u ortho MD in 2 weeks. PMH: ETOH use, , CKD3a, HTN, HLD,  B12 deficiency, osteoporosis with prior vertebral fracture, dehydration,cognitive impairment,  limited SCLC s/p chemo, radiation, prophylactic cranial radiation, CAD by imaging   OT comments  Patient was noted to have cognitive deficits impacting participation in ADLs during session. Patients poor initiation safety awareness and sequencing impacting participation and safety. Patient attempted to sit down with no warning then agitated with therapist for controlling landing to prevent fall. Caregiver in room was able to help with encouraging patient to continue to participate to get settled back in bed.patient will need 24/7 caregiver support in next level of care. Patient will benefit from continued inpatient follow up therapy, <3 hours/day.  Patient's discharge plan remains appropriate at this time. OT will continue to follow acutely.        If plan is discharge home, recommend the following:  A lot of help with walking and/or transfers;A lot of help with bathing/dressing/bathroom;Direct supervision/assist for medications management;Direct supervision/assist for financial management;Assist for transportation;Help with stairs or ramp for entrance;Supervision due to cognitive status   Equipment Recommendations  None recommended by OT       Precautions / Restrictions Precautions Precautions: Fall Restrictions Weight Bearing Restrictions Per Provider Order: No LLE Weight Bearing Per Provider Order: Weight bearing as tolerated       Mobility Bed Mobility Overal bed mobility: Needs Assistance Bed Mobility: Supine to Sit, Sit to Sidelying      Supine to sit: Min assist, HOB elevated   Sit to sidelying: Min assist General bed mobility comments: needed increased A for movement of BLE.           Balance Overall balance assessment: Needs assistance Sitting-balance support: No upper extremity supported, Feet supported Sitting balance-Leahy Scale: Good     Standing balance support: Bilateral upper extremity supported, Reliant on assistive device for balance Standing balance-Leahy Scale: Poor                             ADL either performed or assessed with clinical judgement   ADL Overall ADL's : Needs assistance/impaired     Grooming: Oral care;Sitting;Set up Grooming Details (indicate cue type and reason): needing set up to complete task with patient not attempting to engage in task without set up. no intiation cues were able to get patient to engage in task.         General ADL Comments: patient attempting to take steps up head of bed with patient hunched over RW on intial stand. patient unable to follow cues for proper sequencing with patient attempting to plop back down on the bed. patient was provided with max A to slow transition from standing to sitting to reduce plopping down. patient irritated upon reaching bed reporting dont do that. patient was educated on how transitioning onto bed needs to be careful to avoid hurting hip. patients caregiver in room provided encouragement for patient to take it slow and reduce falls risk as well. patient was able to scoot up to [facility] with mod A with increased time and patient stopping prior to reaching ideal postioning with +2 max A to scoot up in bed with  increased time. patient declined to further participate and was left poistioned in bed with caregiver in room.     Communication Communication Communication: No apparent difficulties   Cognition Arousal: Alert Behavior During Therapy: Impulsive Cognition: History of cognitive impairments              OT - Cognition Comments: patient had poor awareness of where she was and orientation. patient reporting year was 2024 and she was at [place] attempting to report [place]. patient had poor safety awareness. unable to attend to task long enough to engage in cognitive assessment.                 Following commands: Impaired Following commands impaired: Follows one step commands with increased time, Follows one step commands inconsistently      Cueing   Cueing Techniques: Verbal cues, Gestural cues             Pertinent Vitals/ Pain       Pain Assessment Pain Assessment: Faces Faces Pain Scale: Hurts a little bit Pain Location: L hip with movement Pain Descriptors / Indicators: Aching, Sore, Grimacing Pain Intervention(s): Limited activity within patient's tolerance, Monitored during session         Frequency  Min 2X/week        Progress Toward Goals  OT Goals(current goals can now be found in the care plan section)  Progress towards OT goals: Progressing toward goals     Plan         AM-PAC OT 6 Clicks Daily Activity     Outcome Measure   Help from another person eating meals?: A Little Help from another person taking care of personal grooming?: A Little Help from another person toileting, which includes using toliet, bedpan, or urinal?: A Lot Help from another person bathing (including washing, rinsing, drying)?: A Lot Help from another person to put on and taking off regular upper body clothing?: A Little Help from another person to put on and taking off regular lower body clothing?: A Lot 6 Click Score: 15    End of Session Equipment Utilized During Treatment: Gait belt;Rolling walker (2 wheels)  OT Visit Diagnosis: Unsteadiness on feet (R26.81);Muscle weakness (generalized) (M62.81);History of falling (Z91.81);Pain Pain - Right/Left: Left Pain - part of body: Hip   Activity Tolerance Patient limited by pain   Patient Left with call bell/phone within  reach;with family/visitor present;in bed;with bed alarm set   Nurse Communication          Time: 1610-9604 OT Time Calculation (min): 19 min  Charges: OT General Charges $OT Visit: 1 Visit OT Treatments $Self Care/Home Management : 8-22 mins  Wynette Heckler, MS Acute Rehabilitation Department Office# [phone]   Jame Maze 02/08/2024, 3:34 PM

## 2024-02-08 NOTE — Plan of Care (Signed)
  Problem: Activity: Goal: Risk for activity intolerance will decrease Outcome: Progressing   Problem: Safety: Goal: Ability to remain free from injury will improve Outcome: Progressing   Problem: Pain Managment: Goal: General experience of comfort will improve and/or be controlled Outcome: Progressing

## 2024-02-08 NOTE — NC FL2 (Signed)
 [place]  MEDICAID FL2 LEVEL OF CARE FORM     IDENTIFICATION  Patient Name: [name] [name] Birthdate: October 04, 1950 Sex: female Admission Date (Current Location): 02/02/2024  [facility] and [place] Number:  Producer, television/film/video and Address:  [facility],  [address], [place] [address]      Provider Number: [id number]  Attending Physician Name and Address:  Barbee Lew, MD  Relative Name and Phone Number:       Current Level of Care: Hospital Recommended Level of Care: Skilled Nursing Facility Prior Approval Number:    Date Approved/Denied:   PASRR Number: pending  Discharge Plan: SNF    Current Diagnoses: Patient Active Problem List   Diagnosis Date Noted   Closed subcapital fracture of left femur (HCC) 02/03/2024   Fracture of femoral neck, left, closed (HCC) 02/02/2024   Dehydration 02/02/2024   Multiple closed fractures of ribs of left side 09/29/2022   Multiple closed fractures of proximal phalanx of finger 10/08/2021   Alcohol  use disorder 09/11/2021   Vitamin B 12 deficiency 09/11/2021   Post-traumatic headache 09/11/2021   Insomnia 09/11/2021   Cognitive impairment 08/30/2021   Benign essential HTN    Hyperglycemia    Urinary incontinence    Pelvic pain    Closed pelvic fracture (HCC) 08/03/2020   Closed fracture of left superior pubic ramus (HCC) 07/30/2020   Left hip pain 07/30/2020   GAD (generalized anxiety disorder) 07/30/2020   DJD (degenerative joint disease) 09/20/2019   Familial hyperlipidemia 07/01/2018   Right shoulder pain 07/14/2017   Malignant melanoma of skin of [place]hus of right eye (HCC) 03/16/2017   PCP NOTES >>>>>>>>>>>>>>> 05/22/2015   High coronary artery calcium  score 02/14/2015   Hernia, abdominal 04/01/2013   Depression with anxiety 04/01/2012   Malignant neoplasm of upper lobe, bronchus or lung 09/30/2011   H/O: lung cancer 09/25/2011   Annual physical exam 09/05/2011   IBS -diarrhea predominant  09/05/2011   COPD (chronic obstructive pulmonary disease) (HCC) 09/05/2011   Osteoporosis 03/01/2010   TOBACCO ABUSE 11/14/2008   Dyslipidemia 05/18/2007   Essential hypertension 05/18/2007    Orientation RESPIRATION BLADDER Height & Weight     Self, Situation, Place  Normal Incontinent, External catheter (currently with purewick) Weight: 147 lb 14.9 oz (67.1 kg) Height:  5' 7 (170.2 cm)  BEHAVIORAL SYMPTOMS/MOOD NEUROLOGICAL BOWEL NUTRITION STATUS      Incontinent Diet (reg)  AMBULATORY STATUS COMMUNICATION OF NEEDS Skin   Extensive Assist Verbally Other (Comment) (surgical incision only)                       Personal Care Assistance Level of Assistance  Bathing, Dressing Bathing Assistance: Limited assistance   Dressing Assistance: Limited assistance     Functional Limitations Info  Sight, Speech, Hearing Sight Info: Impaired Hearing Info: Adequate Speech Info: Adequate    SPECIAL CARE FACTORS FREQUENCY  PT (By licensed PT), OT (By licensed OT)     PT Frequency: 5x/wk OT Frequency: 5x/wk            Contractures Contractures Info: Not present    Additional Factors Info  Code Status, Allergies Code Status Info: full code Allergies Info: Tramadol  Hcl  Codeine  Penicillins           Current Medications (02/08/2024):  This is the current hospital active medication list Current Facility-Administered Medications  Medication Dose Route Frequency Provider Last Rate Last Admin   acetaminophen  (TYLENOL ) tablet 1,000 mg  1,000 mg Oral Q6H PRN Cockerham, Alicia M, PA-C   1,000 mg at 02/05/24 1610   acetaminophen  (TYLENOL ) tablet 1,000 mg  1,000 mg Oral TID Mathews, Elizabeth G, MD   1,000 mg at 02/07/24 2141   aspirin  EC tablet 81 mg  81 mg Oral BID Cockerham, Alicia M, PA-C   81 mg at 02/08/24 0913   bisacodyl  (DULCOLAX) EC tablet 10 mg  10 mg Oral Daily Mathews, Elizabeth G, MD   10 mg at 02/08/24 1117   carvedilol  (COREG ) tablet 12.5 mg  12.5 mg Oral BID WC  Barbee Lew, MD       cholecalciferol  (VITAMIN D3) 25 MCG (1000 UNIT) tablet 1,000 Units  1,000 Units Oral Daily Cockerham, Alicia M, PA-C   1,000 Units at 02/08/24 9604   citalopram  (CELEXA ) tablet 20 mg  20 mg Oral Daily Mathews, Elizabeth G, MD   20 mg at 02/08/24 0913   fentaNYL  (SUBLIMAZE ) injection 50 mcg  50 mcg Intravenous Q30 min PRN Cockerham, Alicia M, PA-C   50 mcg at 02/03/24 0106   folic acid  (FOLVITE ) tablet 1 mg  1 mg Oral Daily Cockerham, Alicia M, PA-C   1 mg at 02/08/24 5409   gabapentin (NEURONTIN) capsule 200 mg  200 mg Oral BID Mathews, Elizabeth G, MD   200 mg at 02/07/24 1203   hydrOXYzine  (ATARAX ) tablet 25 mg  25 mg Oral TID PRN Mathews, Elizabeth G, MD       multivitamin with minerals tablet 1 tablet  1 tablet Oral Daily Albertus Alt, PA-C   1 tablet at 02/07/24 8119   ondansetron  (ZOFRAN ) injection 4 mg  4 mg Intravenous Q6H Cockerham, Alicia M, PA-C   4 mg at 02/04/24 1478   ondansetron  (ZOFRAN -ODT) disintegrating tablet 4 mg  4 mg Oral Q8H PRN Cockerham, Alicia M, PA-C       oxyCODONE  (Oxy IR/ROXICODONE ) immediate release tablet 2.5 mg  2.5 mg Oral Q4H PRN Cockerham, Alicia M, PA-C       Or   oxyCODONE  (Oxy IR/ROXICODONE ) immediate release tablet 5 mg  5 mg Oral Q4H PRN Cockerham, Alicia M, PA-C   5 mg at 02/08/24 2956   polyethylene glycol (MIRALAX  / GLYCOLAX ) packet 17 g  17 g Oral Daily Barbee Lew, MD   17 g at 02/08/24 2130   potassium chloride  10 mEq in 100 mL IVPB  10 mEq Intravenous Q1 Hr x 5 Barbee Lew, MD       raloxifene  (EVISTA ) tablet 60 mg  60 mg Oral Daily Mathews, Elizabeth G, MD   60 mg at 02/08/24 8657   rosuvastatin  (CRESTOR ) tablet 20 mg  20 mg Oral Daily Mathews, Elizabeth G, MD   20 mg at 02/08/24 8469   senna-docusate (Senokot-S) tablet 2 tablet  2 tablet Oral QHS Barbee Lew, MD       thiamine  (VITAMIN B1) tablet 100 mg  100 mg Oral Daily Cockerham, Alicia M, PA-C   100 mg at 02/08/24 6295   Or    thiamine  (VITAMIN B1) injection 100 mg  100 mg Intravenous Daily Cockerham, Alicia M, PA-C       Vitamin D  (Ergocalciferol ) (DRISDOL ) 1.25 MG (50000 UNIT) capsule 50,000 Units  50,000 Units Oral Q7 days Barbee Lew, MD   50,000 Units at 02/04/24 1148     Discharge Medications: Please see discharge summary for a list of discharge medications.  Relevant Imaging Results:  Relevant Lab Results:   Additional Information  SSN [id number]  Divine Hansley, LCSW

## 2024-02-08 NOTE — TOC Progression Note (Signed)
 Transition of Care [facility]) - Progression Note    Patient Details  Name: [name] [name] MRN: [id number] Date of Birth: Jul 23, 1951  Transition of Care [facility]) CM/SW Contact  Delilah Fend, LCSW Phone Number: 02/08/2024, 2:00 PM  Clinical Narrative:     Met with pt today and have spoken with her sister, Genevia Kern.  Addressed with pt the recommendation made for short term rehab/ SNF to work towards a functional level that current caregivers can provide.  Pt was, surprisingly, agreeable with this plan but clearly not happy about it.  She is eager to get home to her dog but is agreeable with CSW beginning bed search.    Expected Discharge Plan: Home w Home Health Services Barriers to Discharge: Continued Medical Work up  Expected Discharge Plan and Services In-house Referral: Clinical Social Work   Post Acute Care Choice: Home Health Living arrangements for the past 2 months: Single Family Home                 DME Arranged: N/A DME Agency: NA       HH Arranged: PT, OT HH Agency: [facility] Date HH Agency Contacted: 02/04/24 Time HH Agency Contacted: 1506 Representative spoke with at [facility] Agency: Loetta Ringer   Social Determinants of Health (SDOH) Interventions SDOH Screenings   Food Insecurity: No Food Insecurity (02/02/2024)  Housing: Low Risk  (02/02/2024)  Transportation Needs: No Transportation Needs (02/02/2024)  Utilities: Not At Risk (02/02/2024)  Alcohol  Screen: Low Risk  (04/22/2023)  Depression (PHQ2-9): Low Risk  (07/13/2023)  Financial Resource Strain: Low Risk  (04/22/2023)  Physical Activity: Inactive (04/22/2023)  Social Connections: Socially Isolated (02/02/2024)  Stress: No Stress Concern Present (04/22/2023)  Tobacco Use: Medium Risk (02/03/2024)  Health Literacy: Adequate Health Literacy (04/22/2023)    Readmission Risk Interventions    02/04/2024    3:04 PM 02/04/2024    1:48 PM  Readmission Risk Prevention Plan  Transportation Screening Complete Complete   PCP or Specialist Appt within 5-7 Days  Complete  Home Care Screening  Complete  Medication Review (RN CM)  Complete

## 2024-02-08 NOTE — Progress Notes (Signed)
 PROGRESS NOTE    [name] [name]  ONG:[id number] DOB: [birth year] DOA: 02/02/2024 PCP: [name], MD    Brief Narrative:73 y.o. female with hx of limited SCLC s/p chemo, radiation, prophylactic cranial radiation, CAD by imaging, mild cognitive impairment, alcohol  use disorder, CKD3a, HTN, HLD,  B12 deficiency, osteoporosis with prior vertebral fracture, who presents after ground level fall.  She was admitted after mechanical fall tripping on her dog.  She was found to have left hip fracture. Reports ongoing heavy alcohol  use with 1-2 bottles of wine daily     Assessment & Plan:   Principal Problem:   Fracture of femoral neck, left, closed (HCC) Active Problems:   Cognitive impairment   Alcohol  use disorder   Dehydration   Closed subcapital fracture of left femur (HCC)   Left femoral neck fracture  she is status post cannulated screw fixation of the valgus impacted femoral neck fracture 02/03/2024. She is weightbearing as tolerated DVT prophylaxis with aspirin  81 mg twice a day for 4 weeks per Ortho  Follow-up with Ortho 2 weeks after surgery Pain control with Tylenol , oxycodone  Vitamin D  level 23.71 Continue vitamin D  repletion Physical therapy is recommending SNF and she is agreeable to go.  Hospital-acquired delirium -delirium in the setting of etoh use.UA negative no other signs of infection. FAMILY DOES NOT WANT HER TO GET ATIVAN   ATARAX  PRN ORDERED   Alcohol  use disorder-she drinks 1-2 bottles of wine per day continue multivitamin and folate   Recent N/V/D, dehydration RESOLVED   SCLC s/p chemo, radiation, prophylactic cranial radiation:  Has seen Dr. Liam Redhead and no evidence of recurrent disease. On surveillance outpatient.   CAD by imaging/HLD: Holding aspirin . On repatha  outpatient. Continue rosuvastatin .   mild cognitive impairment: stable  CKD3a: Baseline Cr ~ 0.8 - 1.   HTN: Not on antihypertensives. Bp 153/90 At times tachy Will start metoprolol  low  dose  B12 deficiency:  b 12 578  Hypokalemia replete potassium  Constipation-senna miralax  Dulcolax supp  Estimated body mass index is 23.17 kg/m as calculated from the following:   Height as of this encounter: 5' 7 (1.702 m).   Weight as of this encounter: 67.1 kg.  DVT prophylaxis: aspirin  Code Status:full Family Communication: Caregiver at bedside Disposition Plan:  Status is: Inpatient Remains inpatient appropriate because: Acute illness   Consultants:  Ortho  Procedures: Left hip screw fixation 6/11 Antimicrobials: None Subjective: Awake alert caregiver at bedside had a bad day yesterday she got Ativan  and was seen things and seeing dead people and she also had soft blood pressure after getting Ativan   Objective: Vitals:   02/07/24 0542 02/07/24 1344 02/07/24 2117 02/08/24 0522  BP: (!) 138/94 105/68 101/68 (!) 146/81  Pulse: 98 91 96 94  Resp: 18 18 18 17   Temp: 98.2 F (36.8 C) 98 F (36.7 C) 98 F (36.7 C) 97.7 F (36.5 C)  TempSrc: Oral  Oral Axillary  SpO2: 95% 98% 90% 95%  Weight:      Height:        Intake/Output Summary (Last 24 hours) at 02/08/2024 1121 Last data filed at 02/08/2024 0522 Gross per 24 hour  Intake 240 ml  Output 1100 ml  Net -860 ml   Filed Weights   02/02/24 1907 02/03/24 0002  Weight: 68 kg 67.1 kg    Examination:  General exam: Appears in no acute distress Respiratory system: Clear to auscultation. Respiratory effort normal. Cardiovascular system: S1 & S2 heard, RRR. No JVD, murmurs, rubs, gallops  or clicks. No pedal edema. Gastrointestinal system: Abdomen is nondistended, soft and nontender. No organomegaly or masses felt. Normal bowel sounds heard. Central nervous system: Awake oriented to hospital  extremities: Left lower extremity edema Incision cdi  Data Reviewed: I have personally reviewed following labs and imaging studies  CBC: Recent Labs  Lab 02/02/24 1735 02/03/24 0329 02/04/24 0335 02/05/24 0654  02/07/24 1015  WBC 10.4 7.9 7.8 5.5 6.3  NEUTROABS 9.1*  --   --   --   --   HGB 11.9* 11.0* 10.9* 9.7* 10.2*  HCT 36.7 33.9* 34.3* 30.7* 31.5*  MCV 96.3 97.7 98.6 98.1 98.4  PLT 136* 128* 130* 120* 163   Basic Metabolic Panel: Recent Labs  Lab 02/02/24 1735 02/03/24 0329 02/04/24 0335 02/05/24 0654 02/07/24 1015  NA 134* 136 135 137 132*  K 3.9 3.4* 3.4* 3.2* 3.4*  CL 102 106 102 102 95*  CO2 21* 22 21* 24 26  GLUCOSE 117* 102* 113* 87 91  BUN 16 15 12 13 10   CREATININE 1.05* 1.08* 1.01* 0.92 0.72  CALCIUM  9.2 8.7* 8.5* 8.4* 8.6*  MG  --  2.3  --   --   --   PHOS  --  3.0  --   --   --    GFR: Estimated Creatinine Clearance: 63.6 mL/min (by C-G formula based on SCr of 0.72 mg/dL). Liver Function Tests: Recent Labs  Lab 02/04/24 0335 02/05/24 0654  AST 18 15  ALT 13 8  ALKPHOS 43 40  BILITOT 0.5 0.7  PROT 5.9* 5.3*  ALBUMIN 3.0* 2.7*   No results for input(s): LIPASE, AMYLASE in the last 168 hours. No results for input(s): AMMONIA in the last 168 hours. Coagulation Profile: Recent Labs  Lab 02/02/24 1735  INR 1.0   Cardiac Enzymes: No results for input(s): CKTOTAL, CKMB, CKMBINDEX, TROPONINI in the last 168 hours. BNP (last 3 results) No results for input(s): PROBNP in the last 8760 hours. HbA1C: No results for input(s): HGBA1C in the last 72 hours. CBG: No results for input(s): GLUCAP in the last 168 hours. Lipid Profile: No results for input(s): CHOL, HDL, LDLCALC, TRIG, CHOLHDL, LDLDIRECT in the last 72 hours. Thyroid  Function Tests: No results for input(s): TSH, T4TOTAL, FREET4, T3FREE, THYROIDAB in the last 72 hours. Anemia Panel: No results for input(s): VITAMINB12, FOLATE, FERRITIN, TIBC, IRON, RETICCTPCT in the last 72 hours.  Sepsis Labs: No results for input(s): PROCALCITON, LATICACIDVEN in the last 168 hours.  Recent Results (from the past 240 hours)  Surgical PCR screen      Status: None   Collection Time: 02/03/24  2:44 AM   Specimen: Nasal Mucosa; Nasal Swab  Result Value Ref Range Status   MRSA, PCR NEGATIVE NEGATIVE Final   Staphylococcus aureus NEGATIVE NEGATIVE Final    Comment: (NOTE) The Xpert SA Assay (FDA approved for NASAL specimens in patients 24 years of age and older), is one component of a comprehensive surveillance program. It is not intended to diagnose infection nor to guide or monitor treatment. Performed at [facility], [address], [place], [place] [address]          Radiology Studies: DG HIP UNILAT WITH PELVIS 2-3 VIEWS LEFT Result Date: 02/08/2024 CLINICAL DATA:  Postop EXAM: DG HIP (WITH OR WITHOUT PELVIS) 2-3V LEFT COMPARISON:  February 03, 2024 FINDINGS: Status post left hip arthrodesis, 3 Knowles pins in good position with near anatomic alignment. IMPRESSION: Status post left hip arthrodesis. Post ORIF Electronically  Signed   By: Fredrich Jefferson M.D.   On: 02/08/2024 10:52    Scheduled Meds:  acetaminophen   1,000 mg Oral TID   aspirin  EC  81 mg Oral BID   bisacodyl   10 mg Oral Daily   carvedilol   6.25 mg Oral BID WC   cholecalciferol   1,000 Units Oral Daily   citalopram   20 mg Oral Daily   folic acid   1 mg Oral Daily   gabapentin  200 mg Oral BID   multivitamin with minerals  1 tablet Oral Daily   ondansetron  (ZOFRAN ) IV  4 mg Intravenous Q6H   polyethylene glycol  17 g Oral Daily   raloxifene   60 mg Oral Daily   rosuvastatin   20 mg Oral Daily   senna-docusate  2 tablet Oral QHS   thiamine   100 mg Oral Daily   Or   thiamine   100 mg Intravenous Daily   Vitamin D  (Ergocalciferol )  50,000 Units Oral Q7 days   Continuous Infusions:  potassium chloride       LOS: 6 days    Time spent: 38 min  Barbee Lew, MD  02/08/2024, 11:21 AM

## 2024-02-09 ENCOUNTER — Other Ambulatory Visit (HOSPITAL_COMMUNITY): Payer: Self-pay

## 2024-02-09 DIAGNOSIS — S72002A Fracture of unspecified part of neck of left femur, initial encounter for closed fracture: Secondary | ICD-10-CM | POA: Diagnosis not present

## 2024-02-09 DIAGNOSIS — I10 Essential (primary) hypertension: Secondary | ICD-10-CM | POA: Diagnosis not present

## 2024-02-09 DIAGNOSIS — R4189 Other symptoms and signs involving cognitive functions and awareness: Secondary | ICD-10-CM | POA: Diagnosis not present

## 2024-02-09 DIAGNOSIS — F1090 Alcohol use, unspecified, uncomplicated: Secondary | ICD-10-CM | POA: Diagnosis not present

## 2024-02-09 LAB — CBC
HCT: 31.7 % — ABNORMAL LOW (ref 36.0–46.0)
Hemoglobin: 10 g/dL — ABNORMAL LOW (ref 12.0–15.0)
MCH: 31.2 pg (ref 26.0–34.0)
MCHC: 31.5 g/dL (ref 30.0–36.0)
MCV: 98.8 fL (ref 80.0–100.0)
Platelets: 211 10*3/uL (ref 150–400)
RBC: 3.21 MIL/uL — ABNORMAL LOW (ref 3.87–5.11)
RDW: 12.5 % (ref 11.5–15.5)
WBC: 5.6 10*3/uL (ref 4.0–10.5)
nRBC: 0 % (ref 0.0–0.2)

## 2024-02-09 LAB — COMPREHENSIVE METABOLIC PANEL WITH GFR
ALT: 13 U/L (ref 0–44)
AST: 23 U/L (ref 15–41)
Albumin: 3.1 g/dL — ABNORMAL LOW (ref 3.5–5.0)
Alkaline Phosphatase: 48 U/L (ref 38–126)
Anion gap: 11 (ref 5–15)
BUN: 10 mg/dL (ref 8–23)
CO2: 25 mmol/L (ref 22–32)
Calcium: 8.9 mg/dL (ref 8.9–10.3)
Chloride: 101 mmol/L (ref 98–111)
Creatinine, Ser: 0.77 mg/dL (ref 0.44–1.00)
GFR, Estimated: >60 mL/min (ref >60)
Glucose, Bld: 68 mg/dL — ABNORMAL LOW (ref 70–99)
Potassium: 4 mmol/L (ref 3.5–5.1)
Sodium: 137 mmol/L (ref 135–145)
Total Bilirubin: 1.1 mg/dL (ref 0.0–1.2)
Total Protein: 6.1 g/dL — ABNORMAL LOW (ref 6.5–8.1)

## 2024-02-09 MED ORDER — ALPRAZOLAM 0.25 MG PO TABS
0.2500 mg | ORAL_TABLET | Freq: Three times a day (TID) | ORAL | Status: DC | PRN
  Administered 2024-02-09 – 2024-02-10 (×2): 0.25 mg via ORAL
  Filled 2024-02-09 (×2): qty 1

## 2024-02-09 NOTE — Progress Notes (Signed)
 Physical Therapy Treatment Patient Details Name: [name] [name] MRN: [id number] DOB: [birth year] Today's Date: 02/09/2024   History of Present Illness [name] [name] is a 73 yo female s/p fall with L femoral neck fx. Underwent L hip ORIF 02/03/24. Now WBAT with no precautions and f/u ortho MD in 2 weeks. PMH: ETOH use, , CKD3a, HTN, HLD,  B12 deficiency, osteoporosis with prior vertebral fracture, dehydration,cognitive impairment,  limited SCLC s/p chemo, radiation, prophylactic cranial radiation, CAD by imaging    PT Comments  Pt received in bed, caregiver at bedside. Pt incontinent of loose stool requiring assistance for bed mobility and hygiene. ModA to transfer to edge of bed. Sit<>stand transfers continue to vary with assistance level due to pt's anxiety and surface level. Multimodal repeated cues to stand upright at [facility] and advance a very short distance in room, unsafely sitting in bedside recliner. Will attempt to further gait distance/training next visit while following with chair to reduce anxiety. Initial recs for STR at d/c remain appropriate.    If plan is discharge home, recommend the following: A lot of help with walking and/or transfers;A little help with bathing/dressing/bathroom;Assistance with cooking/housework;Assist for transportation;Help with stairs or ramp for entrance   Can travel by private vehicle     No  Equipment Recommendations  None recommended by PT    Recommendations for Other Services       Precautions / Restrictions Precautions Precautions: Fall Precaution/Restrictions Comments: AMS Restrictions Weight Bearing Restrictions Per Provider Order: Yes LLE Weight Bearing Per Provider Order: Weight bearing as tolerated     Mobility  Bed Mobility Overal bed mobility: Needs Assistance Bed Mobility: Supine to Sit     Supine to sit: Mod assist, HOB elevated, Used rails     General bed mobility comments: needed increased A for movement of BLE  and complete scooting to EOB    Transfers Overall transfer level: Needs assistance Equipment used: Rolling walker (2 wheels) Transfers: Sit to/from Stand Sit to Stand: Min assist, Mod assist, Max assist           General transfer comment: high anxiety requiring any where from Min to Max Assist and repeat VC's for safety.  `    Ambulation/Gait Ambulation/Gait assistance: +2 physical assistance, Mod assist Gait Distance (Feet): 3 Feet Assistive device: Rolling walker (2 wheels) Gait Pattern/deviations: Step-to pattern, Decreased step length - right, Decreased step length - left, Shuffle, Trunk flexed Gait velocity: decreased     General Gait Details: High anxiety limiting gait distance, will attempt increasing tomorrow with cair follow   Stairs             Wheelchair Mobility     Tilt Bed    Modified Rankin (Stroke Patients Only)       Balance Overall balance assessment: Needs assistance Sitting-balance support: No upper extremity supported, Feet supported Sitting balance-Leahy Scale: Good     Standing balance support: Bilateral upper extremity supported, Reliant on assistive device for balance Standing balance-Leahy Scale: Poor Standing balance comment: High fall risk                            Communication Communication Communication: No apparent difficulties  Cognition Arousal: Alert Behavior During Therapy: Impulsive   PT - Cognitive impairments: History of cognitive impairments, Safety/Judgement                       PT - Cognition Comments: AxO x 1  nervous Lady requiring repeat functional simple commands.  Easily distracted and pain triggering increased anxiety.  Near panic amb top bathroom requiring + 2 asisst and max comfort/coaxing Following commands: Impaired Following commands impaired: Follows one step commands with increased time, Follows one step commands inconsistently    Cueing Cueing Techniques: Verbal cues,  Gestural cues  Exercises General Exercises - Lower Extremity Ankle Circles/Pumps: AROM, Both, 15 reps, Supine Heel Raises: AAROM, Left, 5 reps, Supine    General Comments General comments (skin integrity, edema, etc.): L lateral hip bandage dry and intact      Pertinent Vitals/Pain Pain Assessment Pain Assessment: Faces Faces Pain Scale: Hurts even more Pain Location: L hip with activity Pain Descriptors / Indicators: Operative site guarding, Grimacing Pain Intervention(s): Limited activity within patient's tolerance    Home Living                          Prior Function            PT Goals (current goals can now be found in the care plan section) Acute Rehab PT Goals Patient Stated Goal: HOME to my dog    Frequency    Min 5X/week      PT Plan      Co-evaluation              AM-PAC PT 6 Clicks Mobility   Outcome Measure  Help needed turning from your back to your side while in a flat bed without using bedrails?: A Lot Help needed moving from lying on your back to sitting on the side of a flat bed without using bedrails?: A Lot Help needed moving to and from a bed to a chair (including a wheelchair)?: A Lot Help needed standing up from a chair using your arms (e.g., wheelchair or bedside chair)?: A Lot Help needed to walk in hospital room?: A Lot Help needed climbing 3-5 steps with a railing? : Total 6 Click Score: 11    End of Session Equipment Utilized During Treatment: Gait belt Activity Tolerance: Other (comment) (Anxiety) Patient left: in chair;with call bell/phone within reach;with chair alarm set;with family/visitor present Nurse Communication: Mobility status PT Visit Diagnosis: Unsteadiness on feet (R26.81);Difficulty in walking, not elsewhere classified (R26.2);Pain Pain - Right/Left: Left Pain - part of body: Hip     Time: 1540-1616 PT Time Calculation (min) (ACUTE ONLY): 36 min  Charges:    $Gait Training: 8-22  mins $Therapeutic Activity: 8-22 mins PT General Charges $$ ACUTE PT VISIT: 1 Visit                    Melvyn Stagers, PTA  Diona Franklin 02/09/2024, 5:10 PM

## 2024-02-09 NOTE — Progress Notes (Signed)
 [facility]  PROGRESS NOTE  [name] QIO:[id number] DOB: [birth year] DOA: 02/02/2024 PCP: [name], MD   Brief HPI:   73 y.o. female with hx of limited SCLC s/p chemo, radiation, prophylactic cranial radiation, CAD by imaging, mild cognitive impairment, alcohol  use disorder, CKD3a, HTN, HLD,  B12 deficiency, osteoporosis with prior vertebral fracture, who presents after ground level fall.  She was admitted after mechanical fall tripping on her dog.  She was found to have left hip fracture. Reports ongoing heavy alcohol  use with 1-2 bottles of wine daily     Assessment/Plan:   Left femoral neck fracture   status post cannulated screw fixation  02/03/2024. She is weightbearing as tolerated DVT prophylaxis with aspirin  81 mg twice a day for 4 weeks per Ortho  Follow-up with Ortho 2 weeks after surgery Pain control with Tylenol , oxycodone  Vitamin D  level 23.71 Continue vitamin D  repletion Physical therapy is recommending SNF and she is agreeable to go.   Hospital-acquired delirium  -delirium in the setting of etoh use. UA negative no other signs of infection. -Continue as needed Atarax .  Avoid Ativan    Alcohol  use disorder -she drinks 1-2 bottles of wine per day continue multivitamin and folate   SCLC s/p chemo, radiation, prophylactic cranial radiation:   Has seen Dr. Liam Redhead and no evidence of recurrent disease. On surveillance outpatient.    CAD by imaging/HLD:   On repatha  outpatient. Continue rosuvastatin .    mild cognitive impairment:  stable   CKD3a:  Baseline Cr ~ 0.8 - 1.   HTN:  Not on antihypertensives.  Started on  Coreg    B12 deficiency:  b 12 578   Hypokalemia  replete    Constipation -senna miralax  Dulcolax supp      Medications     acetaminophen   1,000 mg Oral TID   aspirin  EC  81 mg Oral BID   bisacodyl   10 mg Oral Daily   carvedilol   12.5 mg Oral BID WC   cholecalciferol   1,000 Units Oral Daily   citalopram   20 mg  Oral Daily   folic acid   1 mg Oral Daily   multivitamin with minerals  1 tablet Oral Daily   ondansetron  (ZOFRAN ) IV  4 mg Intravenous Q6H   polyethylene glycol  17 g Oral Daily   raloxifene   60 mg Oral Daily   rosuvastatin   20 mg Oral Daily   senna-docusate  2 tablet Oral QHS   thiamine   100 mg Oral Daily   Or   thiamine   100 mg Intravenous Daily   Vitamin D  (Ergocalciferol )  50,000 Units Oral Q7 days     Data Reviewed:   CBG:  No results for input(s): GLUCAP in the last 168 hours.  SpO2: 96 % O2 Flow Rate (L/min): 2 L/min    Vitals:   02/08/24 0522 02/08/24 1315 02/08/24 2142 02/09/24 0646  BP: (!) 146/81  (!) 107/90 137/83  Pulse: 94 94 95 94  Resp: 17 18 18 18   Temp: 97.7 F (36.5 C)  98.7 F (37.1 C) 98.4 F (36.9 C)  TempSrc: Axillary  Oral Oral  SpO2: 95% 97% 99% 96%  Weight:      Height:          Data Reviewed:  Basic Metabolic Panel: Recent Labs  Lab 02/02/24 1735 02/03/24 0329 02/04/24 0335 02/05/24 0654 02/07/24 1015 02/08/24 1227  NA 134* 136 135 137 132*  --   K 3.9 3.4* 3.4* 3.2* 3.4*  --  CL 102 106 102 102 95*  --   CO2 21* 22 21* 24 26  --   GLUCOSE 117* 102* 113* 87 91  --   BUN 16 15 12 13 10   --   CREATININE 1.05* 1.08* 1.01* 0.92 0.72  --   CALCIUM  9.2 8.7* 8.5* 8.4* 8.6*  --   MG  --  2.3  --   --   --  2.2  PHOS  --  3.0  --   --   --   --     CBC: Recent Labs  Lab 02/02/24 1735 02/03/24 0329 02/04/24 0335 02/05/24 0654 02/07/24 1015  WBC 10.4 7.9 7.8 5.5 6.3  NEUTROABS 9.1*  --   --   --   --   HGB 11.9* 11.0* 10.9* 9.7* 10.2*  HCT 36.7 33.9* 34.3* 30.7* 31.5*  MCV 96.3 97.7 98.6 98.1 98.4  PLT 136* 128* 130* 120* 163    LFT Recent Labs  Lab 02/04/24 0335 02/05/24 0654  AST 18 15  ALT 13 8  ALKPHOS 43 40  BILITOT 0.5 0.7  PROT 5.9* 5.3*  ALBUMIN 3.0* 2.7*     Antibiotics: Anti-infectives (From admission, onward)    Start     Dose/Rate Route Frequency Ordered Stop   02/03/24 1730  ceFAZolin   (ANCEF ) IVPB 2g/100 mL premix        2 g 200 mL/hr over 30 Minutes Intravenous Every 8 hours 02/03/24 1229 02/04/24 1641   02/03/24 0930  ceFAZolin  (ANCEF ) IVPB 2g/100 mL premix        2 g 200 mL/hr over 30 Minutes Intravenous On call to O.R. 02/03/24 0839 02/03/24 0950   02/03/24 0901  ceFAZolin  (ANCEF ) 2-4 GM/100ML-% IVPB       Note to Pharmacy: Lamonte Pimenta: cabinet override      02/03/24 0901 02/03/24 0946        DVT prophylaxis: Aspirin , per ortho  Code Status: Full code  Family Communication:    CONSULTS orthopedics   Subjective   Denies any complaints.   Objective    Physical Examination:   General-appears in no acute distress Heart-S1-S2, regular, no murmur auscultated Lungs-clear to auscultation bilaterally, no wheezing or crackles auscultated Abdomen-soft, nontender, no organomegaly Extremities-no edema in the lower extremities Neuro-alert, oriented x3, no focal deficit noted   Status is: Inpatient:             [name] S [name]   [facility] If 7PM-7AM, please contact night-coverage at [email or web address], Office  [phone]   02/09/2024, 7:51 AM  LOS: 7 days

## 2024-02-09 NOTE — Plan of Care (Signed)
  Problem: Elimination: Goal: Will not experience complications related to bowel motility Outcome: Progressing   Problem: Safety: Goal: Ability to remain free from injury will improve Outcome: Progressing   Problem: Pain Management: Goal: Pain level will decrease Outcome: Progressing

## 2024-02-09 NOTE — TOC Progression Note (Signed)
 Transition of Care [facility]) - Progression Note    Patient Details  Name: [name] [name] MRN: [id number] Date of Birth: 27-Jul-1951  Transition of Care [facility]) CM/SW Contact  Delilah Fend, LCSW Phone Number: 02/09/2024, 3:10 PM  Clinical Narrative:     Continue to await PASRR # to proceed with SNF bed choice and placement.  Expected Discharge Plan: Home w Home Health Services Barriers to Discharge: Continued Medical Work up  Expected Discharge Plan and Services In-house Referral: Clinical Social Work   Post Acute Care Choice: Home Health Living arrangements for the past 2 months: Single Family Home                 DME Arranged: N/A DME Agency: NA       HH Arranged: PT, OT HH Agency: [facility] Date HH Agency Contacted: 02/04/24 Time HH Agency Contacted: 1506 Representative spoke with at [facility] Agency: Loetta Ringer   Social Determinants of Health (SDOH) Interventions SDOH Screenings   Food Insecurity: No Food Insecurity (02/02/2024)  Housing: Low Risk  (02/02/2024)  Transportation Needs: No Transportation Needs (02/02/2024)  Utilities: Not At Risk (02/02/2024)  Alcohol  Screen: Low Risk  (04/22/2023)  Depression (PHQ2-9): Low Risk  (07/13/2023)  Financial Resource Strain: Low Risk  (04/22/2023)  Physical Activity: Inactive (04/22/2023)  Social Connections: Socially Isolated (02/02/2024)  Stress: No Stress Concern Present (04/22/2023)  Tobacco Use: Medium Risk (02/03/2024)  Health Literacy: Adequate Health Literacy (04/22/2023)    Readmission Risk Interventions    02/04/2024    3:04 PM 02/04/2024    1:48 PM  Readmission Risk Prevention Plan  Transportation Screening Complete Complete  PCP or Specialist Appt within 5-7 Days  Complete  Home Care Screening  Complete  Medication Review (RN CM)  Complete

## 2024-02-09 NOTE — Plan of Care (Signed)
   Problem: Education: Goal: Knowledge of General Education information will improve Description: Including pain rating scale, medication(s)/side effects and non-pharmacologic comfort measures Outcome: Progressing   Problem: Activity: Goal: Risk for activity intolerance will decrease Outcome: Progressing

## 2024-02-10 DIAGNOSIS — R4189 Other symptoms and signs involving cognitive functions and awareness: Secondary | ICD-10-CM | POA: Diagnosis not present

## 2024-02-10 DIAGNOSIS — S72002A Fracture of unspecified part of neck of left femur, initial encounter for closed fracture: Secondary | ICD-10-CM | POA: Diagnosis not present

## 2024-02-10 DIAGNOSIS — I10 Essential (primary) hypertension: Secondary | ICD-10-CM | POA: Diagnosis not present

## 2024-02-10 DIAGNOSIS — F1090 Alcohol use, unspecified, uncomplicated: Secondary | ICD-10-CM | POA: Diagnosis not present

## 2024-02-10 MED ORDER — ONDANSETRON HCL 4 MG PO TABS: 4.0000 mg | ORAL_TABLET | Freq: Three times a day (TID) | ORAL | 0 refills | Status: AC | PRN

## 2024-02-10 MED ORDER — ACETAMINOPHEN 160 MG/5ML PO SOLN
1000.0000 mg | Freq: Three times a day (TID) | ORAL | Status: DC
  Administered 2024-02-10 – 2024-02-12 (×5): 1000 mg via ORAL
  Filled 2024-02-10 (×6): qty 40.6

## 2024-02-10 MED ORDER — HYDROCODONE-ACETAMINOPHEN 5-325 MG PO TABS: 1.0000 | ORAL_TABLET | Freq: Four times a day (QID) | ORAL | 0 refills | Status: DC | PRN

## 2024-02-10 NOTE — Progress Notes (Signed)
 [facility]  PROGRESS NOTE  OPIE FANTON MWU:[id number] DOB: [birth year] DOA: 02/02/2024 PCP: [name], MD   Brief HPI:   73 y.o. female with hx of limited SCLC s/p chemo, radiation, prophylactic cranial radiation, CAD by imaging, mild cognitive impairment, alcohol  use disorder, CKD3a, HTN, HLD,  B12 deficiency, osteoporosis with prior vertebral fracture, who presents after ground level fall.  She was admitted after mechanical fall tripping on her dog.  She was found to have left hip fracture. Reports ongoing heavy alcohol  use with 1-2 bottles of wine daily     Assessment/Plan:   Left femoral neck fracture   status post cannulated screw fixation  02/03/2024. She is weightbearing as tolerated DVT prophylaxis with aspirin  81 mg twice a day for 4 weeks per Ortho  Follow-up with Ortho 2 weeks after surgery Pain control with Tylenol , oxycodone  Vitamin D  level 23.71 Continue vitamin D  repletion Physical therapy is recommending SNF and she is agreeable to go.   Hospital-acquired delirium  -Resolved -delirium in the setting of etoh use. UA negative no other signs of infection. -Continue as needed Atarax .  Avoid Ativan    Alcohol  use disorder -she drinks 1-2 bottles of wine per day continue multivitamin and folate   SCLC s/p chemo, radiation, prophylactic cranial radiation:   Has seen Dr. Liam Redhead and no evidence of recurrent disease. On surveillance outpatient.    CAD by imaging/HLD:   On repatha  outpatient. Continue rosuvastatin .    mild cognitive impairment:  stable   CKD3a:  Baseline Cr ~ 0.8 - 1.   HTN:  Not on antihypertensives.  Started on  Coreg    B12 deficiency:  b 12 578   Hypokalemia  replete    Constipation -senna miralax  Dulcolax supp      Medications     acetaminophen   1,000 mg Oral TID   aspirin  EC  81 mg Oral BID   bisacodyl   10 mg Oral Daily   carvedilol   12.5 mg Oral BID WC   cholecalciferol   1,000 Units Oral Daily    citalopram   20 mg Oral Daily   folic acid   1 mg Oral Daily   multivitamin with minerals  1 tablet Oral Daily   ondansetron  (ZOFRAN ) IV  4 mg Intravenous Q6H   polyethylene glycol  17 g Oral Daily   raloxifene   60 mg Oral Daily   rosuvastatin   20 mg Oral Daily   senna-docusate  2 tablet Oral QHS   thiamine   100 mg Oral Daily   Or   thiamine   100 mg Intravenous Daily   Vitamin D  (Ergocalciferol )  50,000 Units Oral Q7 days     Data Reviewed:   CBG:  No results for input(s): GLUCAP in the last 168 hours.  SpO2: 98 % O2 Flow Rate (L/min): 2 L/min    Vitals:   02/09/24 0646 02/09/24 1328 02/09/24 2201 02/10/24 0531  BP: 137/83 131/86 (!) 150/79 125/75  Pulse: 94 92 93 92  Resp: 18 15 18 18   Temp: 98.4 F (36.9 C) 97.8 F (36.6 C) 98 F (36.7 C) 97.7 F (36.5 C)  TempSrc: Oral Oral Oral Oral  SpO2: 96% 100% 97% 98%  Weight:      Height:          Data Reviewed:  Basic Metabolic Panel: Recent Labs  Lab 02/04/24 0335 02/05/24 0654 02/07/24 1015 02/08/24 1227 02/09/24 0728  NA 135 137 132*  --  137  K 3.4* 3.2* 3.4*  --  4.0  CL  102 102 95*  --  101  CO2 21* 24 26  --  25  GLUCOSE 113* 87 91  --  68*  BUN 12 13 10   --  10  CREATININE 1.01* 0.92 0.72  --  0.77  CALCIUM  8.5* 8.4* 8.6*  --  8.9  MG  --   --   --  2.2  --     CBC: Recent Labs  Lab 02/04/24 0335 02/05/24 0654 02/07/24 1015 02/09/24 0728  WBC 7.8 5.5 6.3 5.6  HGB 10.9* 9.7* 10.2* 10.0*  HCT 34.3* 30.7* 31.5* 31.7*  MCV 98.6 98.1 98.4 98.8  PLT 130* 120* 163 211    LFT Recent Labs  Lab 02/04/24 0335 02/05/24 0654 02/09/24 0728  AST 18 15 23   ALT 13 8 13   ALKPHOS 43 40 48  BILITOT 0.5 0.7 1.1  PROT 5.9* 5.3* 6.1*  ALBUMIN 3.0* 2.7* 3.1*     Antibiotics: Anti-infectives (From admission, onward)    Start     Dose/Rate Route Frequency Ordered Stop   02/03/24 1730  ceFAZolin  (ANCEF ) IVPB 2g/100 mL premix        2 g 200 mL/hr over 30 Minutes Intravenous Every 8 hours 02/03/24  1229 02/04/24 1641   02/03/24 0930  ceFAZolin  (ANCEF ) IVPB 2g/100 mL premix        2 g 200 mL/hr over 30 Minutes Intravenous On call to O.R. 02/03/24 0839 02/03/24 0950   02/03/24 0901  ceFAZolin  (ANCEF ) 2-4 GM/100ML-% IVPB       Note to Pharmacy: Lamonte Pimenta: cabinet override      02/03/24 0901 02/03/24 0946        DVT prophylaxis: Aspirin , per ortho  Code Status: Full code  Family Communication:    CONSULTS orthopedics   Subjective   Denies any complaints   Objective    Physical Examination:   General-appears in no acute distress Heart-S1-S2, regular, no murmur auscultated Lungs-clear to auscultation bilaterally, no wheezing or crackles auscultated Abdomen-soft, nontender, no organomegaly Extremities-no edema in the lower extremities Neuro-alert, oriented to self only   Status is: Inpatient:             Sneijder Bernards S Briscoe Daniello   [facility] If 7PM-7AM, please contact night-coverage at [email or web address], Office  [phone]   02/10/2024, 9:03 AM  LOS: 8 days

## 2024-02-10 NOTE — Progress Notes (Signed)
     Subjective: Patient reports pain as mild, didn't sleep well yesterday and is resting more today.  Caretaker Burdette Carolin is at the bedside, provides some of history.  Has been smiling and doing okay with PT, though now recommended to go to SNF upon dispo.   No overnight events.  Chronic numbness in feet, denies new numbness or tingling in leg.  No other complaints.  Objective:   VITALS:   Vitals:   02/09/24 0646 02/09/24 1328 02/09/24 2201 02/10/24 0531  BP: 137/83 131/86 (!) 150/79 125/75  Pulse: 94 92 93 92  Resp: 18 15 18 18   Temp: 98.4 F (36.9 C) 97.8 F (36.6 C) 98 F (36.7 C) 97.7 F (36.5 C)  TempSrc: Oral Oral Oral Oral  SpO2: 96% 100% 97% 98%  Weight:      Height:        AAOx4, resting comfortably in bed in NAD Sensation intact distally Intact pulses distally Dorsiflexion/Plantar flexion intact Incision: dressing C/D/I Compartment soft Wiggles toes appropriately    Lab Results  Component Value Date   WBC 5.6 02/09/2024   HGB 10.0 (L) 02/09/2024   HCT 31.7 (L) 02/09/2024   MCV 98.8 02/09/2024   PLT 211 02/09/2024   BMET    Component Value Date/Time   NA 137 02/09/2024 0728   NA 143 02/05/2017 0817   K 4.0 02/09/2024 0728   K 4.3 02/05/2017 0817   CL 101 02/09/2024 0728   CL 104 12/23/2012 1338   CO2 25 02/09/2024 0728   CO2 25 02/05/2017 0817   GLUCOSE 68 (L) 02/09/2024 0728   GLUCOSE 96 02/05/2017 0817   GLUCOSE 85 12/23/2012 1338   BUN 10 02/09/2024 0728   BUN 16.0 02/05/2017 0817   CREATININE 0.77 02/09/2024 0728   CREATININE 1.30 (H) 01/21/2024 1332   CREATININE 1.0 02/05/2017 0817   CALCIUM  8.9 02/09/2024 0728   CALCIUM  9.5 02/05/2017 0817   EGFR 60 (L) 02/05/2017 0817   GFRNONAA >60 02/09/2024 0728   GFRNONAA 44 (L) 01/21/2024 1332      Xray: PACU x-rays reviewed demonstrate cannulated screw fixation of valgus impacted femoral neck fracture  Assessment/Plan: 7 Days Post-Op   Principal Problem:   Fracture of femoral neck, left,  closed (HCC) Active Problems:   Cognitive impairment   Alcohol  use disorder   Dehydration   Closed subcapital fracture of left femur (HCC)  Status post left hip fracture cannulated screw fixation 02/03/2024.  Repeat imaging 6/16 shows stable post-operative imaging.  6/16: Continue with PT as tolerated.  No significant leukocytosis, Hgb appears stable, HDS.  Appears to have some hospital acquired delirium per hospitalist, noted hx of severe ETOH use.  Repeat x-rays reassuring.   Post op recs: WB: WBAT no precautions Abx: ancef  x23 hours post op Imaging: PACU xrays Dressing: keep intact until follow up, change PRN if soiled or saturated. DVT prophylaxis: ASA 81 mg twice daily x 4 weeks Follow up: 2 weeks after surgery for a wound check with Dr. Pryor Browning at [facility].  Address: [address], [place], [place] [address]  Office Phone: [phone]    Albertus Alt 02/10/2024, 8:39 AM    Contact information:   Weekdays 7am-5pm epic message Dr. Pryor Browning, or call office for patient follow up: [phone] After hours and holidays please check [email or web address] for group call information for [facility]

## 2024-02-10 NOTE — Plan of Care (Signed)
   Problem: Education: Goal: Knowledge of General Education information will improve Description: Including pain rating scale, medication(s)/side effects and non-pharmacologic comfort measures Outcome: Progressing   Problem: Activity: Goal: Risk for activity intolerance will decrease Outcome: Progressing

## 2024-02-10 NOTE — TOC Progression Note (Signed)
 Transition of Care [facility]) - Progression Note    Patient Details  Name: [name] [name] MRN: [id number] Date of Birth: May 21, 1951  [facility]) CM/SW Contact  Delilah Fend, LCSW Phone Number: 02/10/2024, 3:33 PM  Clinical Narrative:     Continue to await PASRR# to proceed with SNF placement.  Expected Discharge Plan: Home w Home Health Services Barriers to Discharge: Continued Medical Work up  Expected Discharge Plan and Services In-house Referral: Clinical Social Work   Post Acute Care Choice: Home Health Living arrangements for the past 2 months: Single Family Home                 DME Arranged: N/A DME Agency: NA       HH Arranged: PT, OT HH Agency: [facility] Date HH Agency Contacted: 02/04/24 Time HH Agency Contacted: 1506 Representative spoke with at [facility] Agency: Loetta Ringer   Social Determinants of Health (SDOH) Interventions SDOH Screenings   Food Insecurity: No Food Insecurity (02/02/2024)  Housing: Low Risk  (02/02/2024)  Transportation Needs: No Transportation Needs (02/02/2024)  Utilities: Not At Risk (02/02/2024)  Alcohol  Screen: Low Risk  (04/22/2023)  Depression (PHQ2-9): Low Risk  (07/13/2023)  Financial Resource Strain: Low Risk  (04/22/2023)  Physical Activity: Inactive (04/22/2023)  Social Connections: Socially Isolated (02/02/2024)  Stress: No Stress Concern Present (04/22/2023)  Tobacco Use: Medium Risk (02/03/2024)  Health Literacy: Adequate Health Literacy (04/22/2023)    Readmission Risk Interventions    02/04/2024    3:04 PM 02/04/2024    1:48 PM  Readmission Risk Prevention Plan  Transportation Screening Complete Complete  PCP or Specialist Appt within 5-7 Days  Complete  Home Care Screening  Complete  Medication Review (RN CM)  Complete

## 2024-02-10 NOTE — Plan of Care (Signed)
   Problem: Clinical Measurements: Goal: Will remain free from infection Outcome: Progressing Goal: Diagnostic test results will improve Outcome: Progressing

## 2024-02-10 NOTE — Progress Notes (Signed)
 Physical Therapy Treatment Patient Details Name: [name] [name] MRN: [id number] DOB: 11-Sep-1950 Today's Date: 02/10/2024   History of Present Illness [name] [name] is a 73 yo female s/p fall with L femoral neck fx. Underwent L hip ORIF 02/03/24. Now WBAT with no precautions and f/u ortho MD in 2 weeks. PMH: ETOH use, , CKD3a, HTN, HLD,  B12 deficiency, osteoporosis with prior vertebral fracture, dehydration,cognitive impairment,  limited SCLC s/p chemo, radiation, prophylactic cranial radiation, CAD by imaging    PT Comments  Great functional progress this date progressing gait training with RW in hallway 64ft x 3. Caregiver followed with w/c to reduce anxiety and fear of falling. She continues to require MinA for safety, management of RW and positioning during gait, and cues for continuous step advancement due to focus and muti-task struggles. However, overall performed well today. Will continue to progress towards acute PT goals.    If plan is discharge home, recommend the following: A lot of help with walking and/or transfers;A little help with bathing/dressing/bathroom;Assistance with cooking/housework;Assist for transportation;Help with stairs or ramp for entrance   Can travel by private vehicle     No  Equipment Recommendations  None recommended by PT    Recommendations for Other Services       Precautions / Restrictions Precautions Precautions: Fall Restrictions Weight Bearing Restrictions Per Provider Order: Yes LLE Weight Bearing Per Provider Order: Weight bearing as tolerated     Mobility  Bed Mobility Overal bed mobility: Needs Assistance Bed Mobility: Supine to Sit     Supine to sit: Mod assist, HOB elevated, Used rails     General bed mobility comments: needed increased A for movement of  L LE and complete scooting to EOB    Transfers Overall transfer level: Needs assistance Equipment used: Rolling walker (2 wheels) Transfers: Sit to/from  Stand Sit to Stand: Min assist, Mod assist           General transfer comment: Improved ability to complete sit<>stand transfers.    Ambulation/Gait Ambulation/Gait assistance: Min assist, Mod assist, +2 safety/equipment Gait Distance (Feet): 22 Feet Assistive device: Rolling walker (2 wheels) Gait Pattern/deviations: Step-to pattern, Decreased step length - right, Decreased step length - left, Shuffle, Trunk flexed Gait velocity: decreased     General Gait Details: 66ft x 3, followed closely with w/c. Assist to control RW and proper sequencing   Stairs             Wheelchair Mobility     Tilt Bed    Modified Rankin (Stroke Patients Only)       Balance Overall balance assessment: Needs assistance Sitting-balance support: No upper extremity supported, Feet supported Sitting balance-Leahy Scale: Good     Standing balance support: Bilateral upper extremity supported, Reliant on assistive device for balance Standing balance-Leahy Scale: Fair Standing balance comment: High fall risk during dynamic tasks with RW                            Communication Communication Communication: No apparent difficulties  Cognition Arousal: Alert     PT - Cognitive impairments: History of cognitive impairments, Safety/Judgement                       PT - Cognition Comments: Pt requires increased time to complete tasks and reduce anxiety/fear of falling Following commands: Impaired Following commands impaired: Follows one step commands with increased time, Follows one step commands inconsistently  Cueing Cueing Techniques: Verbal cues, Gestural cues  Exercises General Exercises - Lower Extremity Ankle Circles/Pumps: AROM, Both, 15 reps, Supine Long Arc Quad: AROM, Both, 10 reps    General Comments General comments (skin integrity, edema, etc.):  (L lateral hip dressing dry and intact)      Pertinent Vitals/Pain Pain Assessment Pain Assessment:  Faces Faces Pain Scale: Hurts little more Pain Location: L hip with activity Pain Descriptors / Indicators: Aching, Discomfort, Operative site guarding Pain Intervention(s): Limited activity within patient's tolerance    Home Living                          Prior Function            PT Goals (current goals can now be found in the care plan section) Acute Rehab PT Goals Patient Stated Goal: HOME to my dog Progress towards PT goals: Progressing toward goals    Frequency    Min 5X/week      PT Plan      Co-evaluation              AM-PAC PT 6 Clicks Mobility   Outcome Measure  Help needed turning from your back to your side while in a flat bed without using bedrails?: A Lot Help needed moving from lying on your back to sitting on the side of a flat bed without using bedrails?: A Lot Help needed moving to and from a bed to a chair (including a wheelchair)?: A Lot Help needed standing up from a chair using your arms (e.g., wheelchair or bedside chair)?: A Lot Help needed to walk in hospital room?: A Lot Help needed climbing 3-5 steps with a railing? : Total 6 Click Score: 11    End of Session Equipment Utilized During Treatment: Gait belt Activity Tolerance: Patient tolerated treatment well Patient left: in chair;with call bell/phone within reach;with chair alarm set;with family/visitor present Nurse Communication: Mobility status PT Visit Diagnosis: Unsteadiness on feet (R26.81);Difficulty in walking, not elsewhere classified (R26.2);Pain Pain - Right/Left: Left Pain - part of body: Hip     Time: 1232-1310 PT Time Calculation (min) (ACUTE ONLY): 38 min  Charges:    $Gait Training: 8-22 mins $Therapeutic Exercise: 8-22 mins $Therapeutic Activity: 8-22 mins PT General Charges $$ ACUTE PT VISIT: 1 Visit                    Melvyn Stagers, PTA  Diona Franklin 02/10/2024, 2:07 PM

## 2024-02-11 DIAGNOSIS — S72012A Unspecified intracapsular fracture of left femur, initial encounter for closed fracture: Secondary | ICD-10-CM | POA: Diagnosis not present

## 2024-02-11 DIAGNOSIS — R4189 Other symptoms and signs involving cognitive functions and awareness: Secondary | ICD-10-CM | POA: Diagnosis not present

## 2024-02-11 DIAGNOSIS — E860 Dehydration: Secondary | ICD-10-CM | POA: Diagnosis not present

## 2024-02-11 DIAGNOSIS — F1090 Alcohol use, unspecified, uncomplicated: Secondary | ICD-10-CM | POA: Diagnosis not present

## 2024-02-11 MED ORDER — VITAMIN D (ERGOCALCIFEROL) 1.25 MG (50000 UNIT) PO CAPS: 50000.0000 [IU] | ORAL_CAPSULE | ORAL | 0 refills | Status: AC

## 2024-02-11 MED ORDER — CARVEDILOL 12.5 MG PO TABS: 12.5000 mg | ORAL_TABLET | Freq: Two times a day (BID) | ORAL | Status: DC

## 2024-02-11 MED ORDER — SENNOSIDES-DOCUSATE SODIUM 8.6-50 MG PO TABS: 2.0000 | ORAL_TABLET | Freq: Every day | ORAL | Status: DC

## 2024-02-11 MED ORDER — HYDROCODONE-ACETAMINOPHEN 5-325 MG PO TABS: 1.0000 | ORAL_TABLET | Freq: Four times a day (QID) | ORAL | 0 refills | Status: AC | PRN

## 2024-02-11 MED ORDER — VITAMIN B-1 100 MG PO TABS: 100.0000 mg | ORAL_TABLET | Freq: Every day | ORAL | Status: DC

## 2024-02-11 MED ORDER — HYDROXYZINE HCL 10 MG PO TABS
10.0000 mg | ORAL_TABLET | Freq: Three times a day (TID) | ORAL | Status: DC | PRN
  Administered 2024-02-11: 10 mg via ORAL
  Filled 2024-02-11 (×2): qty 1

## 2024-02-11 MED ORDER — ALPRAZOLAM 0.25 MG PO TABS
0.2500 mg | ORAL_TABLET | Freq: Three times a day (TID) | ORAL | Status: DC | PRN
  Administered 2024-02-11: 0.25 mg via ORAL
  Filled 2024-02-11: qty 1

## 2024-02-11 MED ORDER — ALPRAZOLAM 0.25 MG PO TABS: 0.2500 mg | ORAL_TABLET | Freq: Two times a day (BID) | ORAL | 0 refills | Status: DC | PRN

## 2024-02-11 NOTE — Progress Notes (Signed)
 Patient's sister called unit and stated that she just visited [facility] and no longer wants her sister (the patient) to go there. She was unable to give a specific reason as to why [facility] was unacceptable. Genevia Kern stated that she is awaiting a phone call from [facility] representative later tonight. TOC made aware, PTAR cancelled.

## 2024-02-11 NOTE — TOC Progression Note (Signed)
 Transition of Care [facility]) - Progression Note    Patient Details  Name: [name] [name] MRN: [id number] Date of Birth: [birth year]  Transition of Care [facility]) CM/SW Contact  Bari Leys, RN Phone Number: 02/11/2024, 3:59 PM  Clinical Narrative:  [name] [name] [id number] H     Expected Discharge Plan: Home w Home Health Services Barriers to Discharge: Continued Medical Work up  Expected Discharge Plan and Services In-house Referral: Clinical Social Work   Post Acute Care Choice: Home Health Living arrangements for the past 2 months: Single Family Home Expected Discharge Date: 02/11/24               DME Arranged: N/A DME Agency: NA       HH Arranged: PT, OT HH Agency: [facility] Date HH Agency Contacted: 02/04/24 Time HH Agency Contacted: [phone] Representative spoke with at [facility] Agency: Loetta Ringer   Social Determinants of Health (SDOH) Interventions SDOH Screenings   Food Insecurity: No Food Insecurity (02/02/2024)  Housing: Low Risk  (02/02/2024)  Transportation Needs: No Transportation Needs (02/02/2024)  Utilities: Not At Risk (02/02/2024)  Alcohol  Screen: Low Risk  (04/22/2023)  Depression (PHQ2-9): Low Risk  (07/13/2023)  Financial Resource Strain: Low Risk  (04/22/2023)  Physical Activity: Inactive (04/22/2023)  Social Connections: Socially Isolated (02/02/2024)  Stress: No Stress Concern Present (04/22/2023)  Tobacco Use: Medium Risk (02/03/2024)  Health Literacy: Adequate Health Literacy (04/22/2023)    Readmission Risk Interventions    02/04/2024    3:04 PM 02/04/2024    1:48 PM  Readmission Risk Prevention Plan  Transportation Screening Complete Complete  PCP or Specialist Appt within 5-7 Days  Complete  Home Care Screening  Complete  Medication Review (RN CM)  Complete

## 2024-02-11 NOTE — Progress Notes (Signed)
 Called [name] [name] for report [phone], call not answered, voice mail sent for call back.

## 2024-02-11 NOTE — TOC Transition Note (Signed)
 Transition of Care [facility]) - Discharge Note   Patient Details  Name: MALINA GEERS MRN: [id number] Date of Birth: July 31, 1951  Transition [facility]) CM/SW Contact:  Bari Leys, RN Phone Number: 02/11/2024, 4:23 PM   Clinical Narrative:  Call received from [facility],   Rep-Dorian, reports patient's sister is considering patient coming to [facility] although family has committed to another SNF . NCM called to pt's sister, Genevia Kern, [place] reviewed Medicare Star ratings with patient, NCM confirmed with Dorian w/[facility] and Soy w/[facility] that patient will have a private room and allow for visitors to stay with patient. Genevia Kern decided to stay with [facility] due to better location for friends and family. NCM called to Dorian, confirmed family has opted to got with initial SNF. PTAR has been called for transport. No further TOC needs.    Final next level of care: Skilled Nursing Facility Barriers to Discharge: Continued Medical Work up   Patient Goals and CMS Choice Patient states their goals for this hospitalization and ongoing recovery are:: return home          Discharge Placement                Patient to be transferred to facility by: PTAR Name of family member notified: Robbins,[name] (Sister)  [phone] (Mobile) Patient and family notified of of transfer: 02/11/24  Discharge Plan and Services Additional resources added to the After Visit Summary for   In-house Referral: Clinical Social Work   Post Acute Care Choice: Home Health          DME Arranged: N/A DME Agency: NA       HH Arranged: PT, OT HH Agency: [facility] Date HH Agency Contacted: 02/04/24 Time HH Agency Contacted: [phone] Representative spoke with at [facility] Agency: Loetta Ringer  Social Drivers of Health (SDOH) Interventions SDOH Screenings   Food Insecurity: No Food Insecurity (02/02/2024)  Housing: Low Risk  (02/02/2024)  Transportation Needs: No Transportation Needs  (02/02/2024)  Utilities: Not At Risk (02/02/2024)  Alcohol  Screen: Low Risk  (04/22/2023)  Depression (PHQ2-9): Low Risk  (07/13/2023)  Financial Resource Strain: Low Risk  (04/22/2023)  Physical Activity: Inactive (04/22/2023)  Social Connections: Socially Isolated (02/02/2024)  Stress: No Stress Concern Present (04/22/2023)  Tobacco Use: Medium Risk (02/03/2024)  Health Literacy: Adequate Health Literacy (04/22/2023)     Readmission Risk Interventions    02/04/2024    3:04 PM 02/04/2024    1:48 PM  Readmission Risk Prevention Plan  Transportation Screening Complete Complete  PCP or Specialist Appt within 5-7 Days  Complete  Home Care Screening  Complete  Medication Review (RN CM)  Complete

## 2024-02-11 NOTE — Plan of Care (Signed)
  Problem: Education: Goal: Knowledge of General Education information will improve Description: Including pain rating scale, medication(s)/side effects and non-pharmacologic comfort measures Outcome: Progressing   Problem: Health Behavior/Discharge Planning: Goal: Ability to manage health-related needs will improve Outcome: Progressing   Problem: Clinical Measurements: Goal: Ability to maintain clinical measurements within normal limits will improve Outcome: Progressing Goal: Will remain free from infection Outcome: Progressing Goal: Diagnostic test results will improve Outcome: Progressing Goal: Respiratory complications will improve Outcome: Progressing Goal: Cardiovascular complication will be avoided Outcome: Progressing   Problem: Activity: Goal: Risk for activity intolerance will decrease Outcome: Progressing   Problem: Nutrition: Goal: Adequate nutrition will be maintained Outcome: Progressing   Problem: Coping: Goal: Level of anxiety will decrease Outcome: Progressing   Problem: Elimination: Goal: Will not experience complications related to bowel motility Outcome: Progressing   Problem: Pain Managment: Goal: General experience of comfort will improve and/or be controlled Outcome: Progressing   Problem: Safety: Goal: Ability to remain free from injury will improve Outcome: Progressing   Problem: Skin Integrity: Goal: Risk for impaired skin integrity will decrease Outcome: Progressing   Problem: Education: Goal: Verbalization of understanding the information provided (i.e., activity precautions, restrictions, etc) will improve Outcome: Progressing   Problem: Activity: Goal: Ability to ambulate and perform ADLs will improve Outcome: Progressing   Problem: Clinical Measurements: Goal: Postoperative complications will be avoided or minimized Outcome: Progressing   Problem: Self-Concept: Goal: Ability to maintain and perform role responsibilities to  the fullest extent possible will improve Outcome: Progressing   Problem: Pain Management: Goal: Pain level will decrease Outcome: Progressing

## 2024-02-11 NOTE — Progress Notes (Signed)
 Physical Therapy Treatment Patient Details Name: [name] [name] MRN: [id number] DOB: Mar 12, 1951 Today's Date: 02/11/2024   History of Present Illness [name] [name] is a 73 yo female s/p fall with L femoral neck fx. Underwent L hip ORIF 02/03/24. Now WBAT with no precautions and f/u ortho MD in 2 weeks. PMH: ETOH use, , CKD3a, HTN, HLD,  B12 deficiency, osteoporosis with prior vertebral fracture, dehydration,cognitive impairment,  limited SCLC s/p chemo, radiation, prophylactic cranial radiation, CAD by imaging    PT Comments  Pt ambulated 10' x 3 with seated rest breaks. Pt stated she was not able to ambulate farther when encouraged to do so. Ongoing verbal cues required for safe positioning in RW. Pt performed L hip exercises with verbal and manual cues.     If plan is discharge home, recommend the following: A lot of help with walking and/or transfers;A little help with bathing/dressing/bathroom;Assistance with cooking/housework;Assist for transportation;Help with stairs or ramp for entrance   Can travel by private vehicle     Yes  Equipment Recommendations  None recommended by PT    Recommendations for Other Services       Precautions / Restrictions Precautions Precautions: Fall Recall of Precautions/Restrictions: Impaired Restrictions Weight Bearing Restrictions Per Provider Order: No LLE Weight Bearing Per Provider Order: Weight bearing as tolerated     Mobility  Bed Mobility Overal bed mobility: Needs Assistance Bed Mobility: Supine to Sit     Supine to sit: Mod assist, HOB elevated, Used rails     General bed mobility comments: gait belt used as leg lifter for LLE, mod A to pivot hips to EOB    Transfers Overall transfer level: Needs assistance Equipment used: Rolling walker (2 wheels) Transfers: Sit to/from Stand Sit to Stand: Min assist           General transfer comment: sit to stand x 4, VCs for hand placement with every trial (no apparent  carry over)    Ambulation/Gait Ambulation/Gait assistance: Min assist Gait Distance (Feet): 10 Feet Assistive device: Rolling walker (2 wheels) Gait Pattern/deviations: Step-to pattern, Decreased step length - right, Decreased step length - left, Shuffle, Trunk flexed Gait velocity: decreased     General Gait Details: 10' x 3, followed closely with recliner. ongoing VCs for safe positioning in RW and posture with minimal response from pt, encouraged pt to attempt to ambulate farther but she stated she could not   Stairs             Wheelchair Mobility     Tilt Bed    Modified Rankin (Stroke Patients Only)       Balance Overall balance assessment: Needs assistance Sitting-balance support: No upper extremity supported, Feet supported Sitting balance-[name] [name] Scale: Good     Standing balance support: Bilateral upper extremity supported, Reliant on assistive device for balance Standing balance-[name] [name] Scale: Fair Standing balance comment: High fall risk during dynamic tasks with RW                            Communication Communication Communication: No apparent difficulties  Cognition Arousal: Alert     PT - Cognitive impairments: History of cognitive impairments, Safety/Judgement, Memory                       PT - Cognition Comments: Pt requires increased time to complete tasks and reduce anxiety/fear of falling; requires ongoing verbal cues for safety, no carry over Following commands:  Impaired Following commands impaired: Follows one step commands with increased time, Follows one step commands inconsistently    Cueing Cueing Techniques: Verbal cues, Gestural cues, Tactile cues  Exercises General Exercises - Lower Extremity Ankle Circles/Pumps: AROM, Both, Supine, 10 reps Short Arc Quad: AAROM, Left, 5 reps, Supine Heel Slides: AAROM, Left, Supine, 10 reps Hip ABduction/ADduction: AAROM, Left, Supine, 10 reps    General Comments         Pertinent Vitals/Pain Pain Assessment Faces Pain Scale: Hurts little more Pain Location: L hip with activity Pain Descriptors / Indicators: Guarding, Grimacing Pain Intervention(s): Limited activity within patient's tolerance, Monitored during session, Repositioned, Premedicated before session    Home Living                          Prior Function            PT Goals (current goals can now be found in the care plan section) Acute Rehab PT Goals Patient Stated Goal: HOME to my dog PT Goal Formulation: With patient Time For Goal Achievement: 02/26/24 Potential to Achieve Goals: Fair Progress towards PT goals: Progressing toward goals    Frequency    Min 5X/week      PT Plan      Co-evaluation              AM-PAC PT 6 Clicks Mobility   Outcome Measure  Help needed turning from your back to your side while in a flat bed without using bedrails?: A Little Help needed moving from lying on your back to sitting on the side of a flat bed without using bedrails?: A Lot Help needed moving to and from a bed to a chair (including a wheelchair)?: A Little Help needed standing up from a chair using your arms (e.g., wheelchair or bedside chair)?: A Little Help needed to walk in hospital room?: A Little Help needed climbing 3-5 steps with a railing? : A Lot 6 Click Score: 16    End of Session Equipment Utilized During Treatment: Gait belt Activity Tolerance: Patient tolerated treatment well;Patient limited by fatigue Patient left: in chair;with call bell/phone within reach;with chair alarm set;with family/visitor present Nurse Communication: Mobility status PT Visit Diagnosis: Unsteadiness on feet (R26.81);Difficulty in walking, not elsewhere classified (R26.2);Pain Pain - Right/Left: Left Pain - part of body: Hip     Time: 4098-1191 PT Time Calculation (min) (ACUTE ONLY): 25 min  Charges:    $Gait Training: 8-22 mins $Therapeutic Exercise: 8-22  mins PT General Charges $$ ACUTE PT VISIT: 1 Visit                     Lynann Sandman Kistler PT 02/11/2024  Acute Rehabilitation Services  Office [phone]

## 2024-02-11 NOTE — NC FL2 (Signed)
 [place]  MEDICAID FL2 LEVEL OF CARE FORM     IDENTIFICATION  Patient Name: [name] [name] Birthdate: 07-22-1951 Sex: female Admission Date (Current Location): 02/02/2024  [facility] and [place] Number:  Producer, television/film/video and Address:  [facility],  [address], [place] [address]      Provider Number: [id number]  Attending Physician Name and Address:  Ozell Blunt, MD  Relative Name and Phone Number:       Current Level of Care: Hospital Recommended Level of Care: Skilled Nursing Facility Prior Approval Number:    Date Approved/Denied:   PASRR Number: [id number]  Discharge Plan: SNF    Current Diagnoses: Patient Active Problem List   Diagnosis Date Noted   Closed subcapital fracture of left femur (HCC) 02/03/2024   Fracture of femoral neck, left, closed (HCC) 02/02/2024   Dehydration 02/02/2024   Multiple closed fractures of ribs of left side 09/29/2022   Multiple closed fractures of proximal phalanx of finger 10/08/2021   Alcohol  use disorder 09/11/2021   Vitamin B 12 deficiency 09/11/2021   Post-traumatic headache 09/11/2021   Insomnia 09/11/2021   Cognitive impairment 08/30/2021   Benign essential HTN    Hyperglycemia    Urinary incontinence    Pelvic pain    Closed pelvic fracture (HCC) 08/03/2020   Closed fracture of left superior pubic ramus (HCC) 07/30/2020   Left hip pain 07/30/2020   GAD (generalized anxiety disorder) 07/30/2020   DJD (degenerative joint disease) 09/20/2019   Familial hyperlipidemia 07/01/2018   Right shoulder pain 07/14/2017   Malignant melanoma of skin of [place]hus of right eye (HCC) 03/16/2017   PCP NOTES >>>>>>>>>>>>>>> 05/22/2015   High coronary artery calcium  score 02/14/2015   Hernia, abdominal 04/01/2013   Depression with anxiety 04/01/2012   Malignant neoplasm of upper lobe, bronchus or lung 09/30/2011   H/O: lung cancer 09/25/2011   Annual physical exam 09/05/2011   IBS -diarrhea predominant  09/05/2011   COPD (chronic obstructive pulmonary disease) (HCC) 09/05/2011   Osteoporosis 03/01/2010   TOBACCO ABUSE 11/14/2008   Dyslipidemia 05/18/2007   Essential hypertension 05/18/2007    Orientation RESPIRATION BLADDER Height & Weight     Self, Situation, Place  Normal Incontinent, External catheter (currently with purewick) Weight: 147 lb 14.9 oz (67.1 kg) Height:  5' 7 (170.2 cm)  BEHAVIORAL SYMPTOMS/MOOD NEUROLOGICAL BOWEL NUTRITION STATUS      Incontinent Diet (reg)  AMBULATORY STATUS COMMUNICATION OF NEEDS Skin   Extensive Assist Verbally Other (Comment) (surgical incision only)                       Personal Care Assistance Level of Assistance  Bathing, Dressing Bathing Assistance: Limited assistance   Dressing Assistance: Limited assistance     Functional Limitations Info  Sight, Speech, Hearing Sight Info: Impaired Hearing Info: Adequate Speech Info: Adequate    SPECIAL CARE FACTORS FREQUENCY  PT (By licensed PT), OT (By licensed OT)     PT Frequency: 5x/wk OT Frequency: 5x/wk            Contractures Contractures Info: Not present    Additional Factors Info  Code Status, Allergies Code Status Info: full code Allergies Info: Tramadol  Hcl  Codeine  Penicillins           Current Medications (02/11/2024):  This is the current hospital active medication list Current Facility-Administered Medications  Medication Dose Route Frequency Provider Last Rate Last Admin   acetaminophen  (TYLENOL ) 160 MG/5ML solution 1,000  mg  1,000 mg Oral Q8H Lama, Gagan S, MD   1,000 mg at 02/11/24 0831   acetaminophen  (TYLENOL ) tablet 1,000 mg  1,000 mg Oral Q6H PRN Cockerham, Alicia M, PA-C   1,000 mg at 02/05/24 4098   ALPRAZolam  (XANAX ) tablet 0.25 mg  0.25 mg Oral TID PRN Lama, Gagan S, MD   0.25 mg at 02/10/24 2016   aspirin  EC tablet 81 mg  81 mg Oral BID Cockerham, Alicia M, PA-C   81 mg at 02/10/24 2016   bisacodyl  (DULCOLAX) EC tablet 10 mg  10 mg Oral  Daily Mathews, Elizabeth G, MD   10 mg at 02/08/24 1117   carvedilol  (COREG ) tablet 12.5 mg  12.5 mg Oral BID WC Barbee Lew, MD   12.5 mg at 02/11/24 0831   cholecalciferol  (VITAMIN D3) 25 MCG (1000 UNIT) tablet 1,000 Units  1,000 Units Oral Daily Cockerham, Alicia M, PA-C   1,000 Units at 02/10/24 1191   citalopram  (CELEXA ) tablet 20 mg  20 mg Oral Daily Mathews, Elizabeth G, MD   20 mg at 02/10/24 4782   fentaNYL  (SUBLIMAZE ) injection 50 mcg  50 mcg Intravenous Q30 min PRN Cockerham, Alicia M, PA-C   50 mcg at 02/03/24 0106   folic acid  (FOLVITE ) tablet 1 mg  1 mg Oral Daily Cockerham, Alicia M, PA-C   1 mg at 02/10/24 9562   multivitamin with minerals tablet 1 tablet  1 tablet Oral Daily Cockerham, Alicia M, PA-C   1 tablet at 02/09/24 1012   ondansetron  (ZOFRAN ) injection 4 mg  4 mg Intravenous Q6H Cockerham, Alicia M, PA-C   4 mg at 02/04/24 1308   ondansetron  (ZOFRAN -ODT) disintegrating tablet 4 mg  4 mg Oral Q8H PRN Cockerham, Alicia M, PA-C       oxyCODONE  (Oxy IR/ROXICODONE ) immediate release tablet 2.5 mg  2.5 mg Oral Q4H PRN Cockerham, Alicia M, PA-C       Or   oxyCODONE  (Oxy IR/ROXICODONE ) immediate release tablet 5 mg  5 mg Oral Q4H PRN Cockerham, Alicia M, PA-C   5 mg at 02/08/24 6578   polyethylene glycol (MIRALAX  / GLYCOLAX ) packet 17 g  17 g Oral Daily Barbee Lew, MD   17 g at 02/08/24 0914   raloxifene  (EVISTA ) tablet 60 mg  60 mg Oral Daily Mathews, Elizabeth G, MD   60 mg at 02/10/24 1304   rosuvastatin  (CRESTOR ) tablet 20 mg  20 mg Oral Daily Mathews, Elizabeth G, MD   20 mg at 02/10/24 4696   senna-docusate (Senokot-S) tablet 2 tablet  2 tablet Oral QHS Barbee Lew, MD       thiamine  (VITAMIN B1) tablet 100 mg  100 mg Oral Daily Cockerham, Alicia M, PA-C   100 mg at 02/10/24 2952   Or   thiamine  (VITAMIN B1) injection 100 mg  100 mg Intravenous Daily Cockerham, Alicia M, PA-C       Vitamin D  (Ergocalciferol ) (DRISDOL ) 1.25 MG (50000 UNIT) capsule  50,000 Units  50,000 Units Oral Q7 days Barbee Lew, MD   50,000 Units at 02/04/24 1148     Discharge Medications: Please see discharge summary for a list of discharge medications.  Relevant Imaging Results:  Relevant Lab Results:   Additional Information SSN [id number]  Arvella Massingale, LCSW

## 2024-02-11 NOTE — Discharge Summary (Signed)
 Physician Discharge Summary   Patient: [name] [name] MRN: [id number] DOB: [birth year]  Admit date:     02/02/2024  Discharge date: 02/12/24  Discharge Physician: Ozell Blunt   PCP: [name], MD   Recommendations at discharge:   Follow-up orthopedics in 2 weeks Continue vitamin D2 ergocalciferol , 50,000 units every 7 days for the next 4 weeks, starting from 02/18/2024 stop date 03/17/2024   Discharge Diagnoses: Principal Problem:   Fracture of femoral neck, left, closed (HCC) Active Problems:   Cognitive impairment   Alcohol  use disorder   Dehydration   Closed subcapital fracture of left femur (HCC)  Resolved Problems:   * No resolved hospital problems. *  Hospital Course: 73 y.o. female with hx of limited SCLC s/p chemo, radiation, prophylactic cranial radiation, CAD by imaging, mild cognitive impairment, alcohol  use disorder, CKD3a, HTN, HLD,  B12 deficiency, osteoporosis with prior vertebral fracture, who presents after ground level fall.  She was admitted after mechanical fall tripping on her dog.  She was found to have left hip fracture. Reports ongoing heavy alcohol  use with 1-2 bottles of wine daily     Assessment and Plan:  Left femoral neck fracture   status post cannulated screw fixation  02/03/2024. She is weightbearing as tolerated DVT prophylaxis with aspirin  81 mg twice a day for 4 weeks per Ortho  Follow-up with Ortho 2 weeks after surgery Pain control with Vicodin as needed Plan to go to skilled nursing facility for rehab  Vitamin D  deficiency Vitamin D  level 23.71 Continue vitamin D  repletion with ergocalciferol  50,000 units every 7 days for next 4 weeks    Hospital-acquired delirium  -Resolved -delirium in the setting of etoh use. UA negative no other signs of infection. -Continue as needed Xanax     Alcohol  use disorder -she drinks 1-2 bottles of wine per day continue multivitamin and folate -No signs symptoms of alcohol   withdrawal -Continue thiamine  100 mg p.o. daily   SCLC s/p chemo, radiation, prophylactic cranial radiation:   Has seen Dr. Liam Redhead and no evidence of recurrent disease. On surveillance outpatient.    CAD by imaging/HLD:   On repatha  outpatient. Continue rosuvastatin .    mild cognitive impairment:  stable   CKD3a:  Baseline Cr ~ 0.8 - 1.   HTN:  Not on antihypertensives.  Started on  Coreg  12.5 mg p.o. twice daily   B12 deficiency:  b 12 578 Continue vitamin B12 supplementation   Hypokalemia  replete    Constipation -senna - docusate  Excoriation rash - Patient has rash on her back due to excoriation, she has been scratching her back with a back scratcher - Improved today after giving hydroxyzine  yesterday - Can use Benadryl  or hydroxyzine  as needed for itching       Consultants:  Procedures performed:  Disposition: Skilled nursing facility Diet recommendation:  Discharge Diet Orders (From admission, onward)     Start     Ordered   02/11/24 0000  Diet - low sodium heart healthy        02/11/24 1249           Regular diet DISCHARGE MEDICATION: Allergies as of 02/12/2024       Reactions   Tramadol  Hcl Nausea And Vomiting   Codeine Nausea And Vomiting   Penicillins Rash        Medication List     STOP taking these medications    acetaminophen  500 MG tablet Commonly known as: TYLENOL    azithromycin  250 MG  tablet Commonly known as: ZITHROMAX    guaiFENesin -dextromethorphan 100-10 MG/5ML syrup Commonly known as: ROBITUSSIN DM   lidocaine  5 % Commonly known as: Lidoderm    predniSONE  20 MG tablet Commonly known as: DELTASONE        TAKE these medications    ALPRAZolam  0.25 MG tablet Commonly known as: XANAX  Take 1 tablet (0.25 mg total) by mouth 2 (two) times daily as needed for anxiety or sleep.   aspirin  EC 81 MG tablet Take 1 tablet (81 mg total) by mouth 2 (two) times daily for 28 days. Swallow whole. What changed:  when to  take this additional instructions   carvedilol  12.5 MG tablet Commonly known as: COREG  Take 1 tablet (12.5 mg total) by mouth 2 (two) times daily with a meal. What changed:  medication strength See the new instructions.   citalopram  20 MG tablet Commonly known as: CELEXA  Take 1 tablet (20 mg total) by mouth daily.   donepezil  5 MG tablet Commonly known as: ARICEPT  Take 1 tablet (5 mg total) by mouth at bedtime.   folic acid  1 MG tablet Commonly known as: FOLVITE  Take 1 mg by mouth daily.   HYDROcodone -acetaminophen  5-325 MG tablet Commonly known as: NORCO/VICODIN Take 1 tablet by mouth every 6 (six) hours as needed for up to 5 days for moderate pain (pain score 4-6).   ondansetron  4 MG tablet Commonly known as: Zofran  Take 1 tablet (4 mg total) by mouth every 8 (eight) hours as needed for up to 14 days for nausea or vomiting.   raloxifene  60 MG tablet Commonly known as: EVISTA  Take 1 tablet (60 mg total) by mouth daily.   Repatha  SureClick 140 MG/ML Soaj Generic drug: Evolocumab  INJECT 140MG  UNDER THE SKIN ONCE EVERY 14 DAYS   rosuvastatin  40 MG tablet Commonly known as: CRESTOR  TAKE 1 TABLET(40 MG) BY MOUTH AT BEDTIME   senna-docusate 8.6-50 MG tablet Commonly known as: Senokot-S Take 2 tablets by mouth at bedtime.   thiamine  100 MG tablet Commonly known as: Vitamin B-1 Take 1 tablet (100 mg total) by mouth daily.   VITAMIN B-12 CR PO Take 2 tablets by mouth daily.   Vitamin D  (Ergocalciferol ) 1.25 MG (50000 UNIT) Caps capsule Commonly known as: DRISDOL  Take 1 capsule (50,000 Units total) by mouth every 7 (seven) days for 28 days. Start taking on: February 18, 2024               Durable Medical Equipment  (From admission, onward)           Start     Ordered   02/07/24 1027  For home use only DME Hospital bed  Once       Question Answer Comment  Length of Need Lifetime   The above medical condition requires: Patient requires the ability to  reposition frequently   Head must be elevated greater than: 45 degrees   Bed type Semi-electric      02/07/24 1026            Contact information for follow-up providers     Murleen Arms, MD Follow up in 2 week(s).   Specialty: Orthopedic Surgery Why: 2 weeks from surgery Contact information: [address] [place] [address] [phone]         [facility] Follow up.   Specialty: Home Health Services Why: to provide home therapy visits Contact information: [address] [place] [address] [phone]  Contact information for after-discharge care     Destination     [facility] .   Service: Skilled Nursing Contact information: [address] [place]  [address] [phone]                    Discharge Exam: Cleavon Curls Weights   02/02/24 1907 02/03/24 0002  Weight: 68 kg 67.1 kg   General-appears in no acute distress Heart-S1-S2, regular, no murmur auscultated Lungs-clear to auscultation bilaterally, no wheezing or crackles auscultated Abdomen-soft, nontender, no organomegaly Extremities-no edema in the lower extremities Neuro-alert, oriented to self only  Condition at discharge: good  The results of significant diagnostics from this hospitalization (including imaging, microbiology, ancillary and laboratory) are listed below for reference.   Imaging Studies: DG HIP UNILAT WITH PELVIS 2-3 VIEWS LEFT Result Date: 02/08/2024 CLINICAL DATA:  Postop EXAM: DG HIP (WITH OR WITHOUT PELVIS) 2-3V LEFT COMPARISON:  February 03, 2024 FINDINGS: Status post left hip arthrodesis, 3 Knowles pins in good position with near anatomic alignment. IMPRESSION: Status post left hip arthrodesis. Post ORIF Electronically Signed   By: Fredrich Jefferson M.D.   On: 02/08/2024 10:52   DG FEMUR PORT MIN 2 VIEWS LEFT Result Date: 02/03/2024 CLINICAL DATA:  Postoperative state left femur. EXAM: LEFT FEMUR  PORTABLE 2 VIEWS COMPARISON:  CT left hip 02/02/2024 FINDINGS: New ORIF of the previously seen subcapital proximal left femoral fracture with spell 3 longitudinal screws. Minimal inferior displacement of the distal fracture component with respect to the proximal fracture component is similar to prior. No knee joint effusion. Minimal chronic enthesopathic change at the quadriceps insertion on the patella. IMPRESSION: New ORIF of the previously seen subcapital proximal left femoral fracture. Electronically Signed   By: Bertina Broccoli M.D.   On: 02/03/2024 13:46   DG HIP UNILAT WITH PELVIS 2-3 VIEWS LEFT Result Date: 02/03/2024 CLINICAL DATA:  Elective surgery.  Intraoperative left hip pinning. EXAM: DG HIP (WITH OR WITHOUT PELVIS) 2-3V LEFT COMPARISON:  Pelvis and left hip radiographs 02/02/2024, CT left hip 02/02/2024 FINDINGS: Images were performed intraoperatively without the presence of a radiologist. 3 longitudinal screw fixation of the proximal left femoral head and neck, fixating the previously seen subcapital fracture. Total fluoroscopy images: 9 Total fluoroscopy time: 54 seconds Total dose: Radiation Exposure Index (as provided by the fluoroscopic device): 7.9 mGy air Kerma Please see intraoperative findings for further detail. IMPRESSION: Intraoperative fluoroscopy for left hip pinning. Electronically Signed   By: Bertina Broccoli M.D.   On: 02/03/2024 12:10   DG C-Arm 1-60 Min-No Report Result Date: 02/03/2024 Fluoroscopy was utilized by the requesting physician.  No radiographic interpretation.   DG HIP UNILAT WITH PELVIS 2-3 VIEWS RIGHT Result Date: 02/02/2024 CLINICAL DATA:  Right-sided hip pain EXAM: DG HIP (WITH OR WITHOUT PELVIS) 2-V RIGHT COMPARISON:  None Available. FINDINGS: Pelvic ring is intact. No acute fracture or dislocation is noted. No soft tissue abnormality is seen. IMPRESSION: No acute abnormality noted. Electronically Signed   By: Violeta Grey M.D.   On: 02/02/2024 23:24   DG  Knee Right Port Result Date: 02/02/2024 CLINICAL DATA:  Fall yesterday with right knee pain, initial encounter EXAM: PORTABLE RIGHT KNEE - 2 VIEW COMPARISON:  None Available. FINDINGS: No evidence of fracture, dislocation, or joint effusion. No evidence of arthropathy or other focal bone abnormality. Soft tissues are unremarkable. IMPRESSION: No acute abnormality noted. Electronically Signed   By: Violeta Grey M.D.   On: 02/02/2024 23:19   CT  Hip Left Wo Contrast Result Date: 02/02/2024 CLINICAL DATA:  Hip trauma, fracture suspected EXAM: CT OF THE LEFT HIP WITHOUT CONTRAST TECHNIQUE: Multidetector CT imaging of the left hip was performed according to the standard protocol. Multiplanar CT image reconstructions were also generated. RADIATION DOSE REDUCTION: This exam was performed according to the departmental dose-optimization program which includes automated exposure control, adjustment of the mA and/or kV according to patient size and/or use of iterative reconstruction technique. COMPARISON:  Same day hip radiograph FINDINGS: Bones/Joint/Cartilage Nondisplaced mildly impacted subcapital femoral neck fracture of the left femur. Remote pubic rami fractures on the left. Ligaments Suboptimally assessed by CT. Muscles and Tendons Grossly intact. Soft tissues Unremarkable. IMPRESSION: Nondisplaced mildly impacted subcapital femoral neck fracture of the left femur. Electronically Signed   By: Rozell Cornet M.D.   On: 02/02/2024 22:08   DG Hip Unilat With Pelvis 2-3 Views Left Result Date: 02/02/2024 CLINICAL DATA:  Marvell Slider 1 day ago EXAM: DG HIP (WITH OR WITHOUT PELVIS) 2-3V LEFT COMPARISON:  None Available. FINDINGS: Curvilinear lucency with surrounding sclerosis about the subcapital left femoral neck suspicious for nondisplaced fracture. No dislocation. Remote fractures of the left pubic rami. Degenerative changes pubic symphysis, both hips, SI joints and lower lumbar spine. IMPRESSION: Findings suspicious for  nondisplaced subcapital left femoral neck fracture. CT recommended for further evaluation. Electronically Signed   By: Rozell Cornet M.D.   On: 02/02/2024 20:16   DG Chest Port 1 View Result Date: 02/02/2024 CLINICAL DATA:  Fall EXAM: PORTABLE CHEST 1 VIEW COMPARISON:  07/30/2023, CT chest 01/21/2024, 08/25/2021 FINDINGS: Distortion and bandlike fibrosis and scarring in the right hilar/suprahilar region. No acute airspace disease. Stable cardiomediastinal silhouette. No pneumothorax IMPRESSION: No active disease. Stable distortion and bandlike fibrosis and scarring in the right hilar/suprahilar region. Electronically Signed   By: Esmeralda Hedge M.D.   On: 02/02/2024 20:15   CT Chest W Contrast Result Date: 01/26/2024 EXAM: CT CHEST WITH CONTRAST 01/21/2024 03:13:23 PM TECHNIQUE: CT of the chest was performed with the administration of intravenous contrast (75mL iohexol  (OMNIPAQUE ) 300 MG/ML solution). Multiplanar reformatted images are provided for review. Automated exposure control, iterative reconstruction, and/or weight based adjustment of the mA/kV was utilized to reduce the radiation dose to as low as reasonably achievable. COMPARISON: 01/27/2023 CLINICAL HISTORY: Small cell lung cancer (SCLC), staging. Malignant neoplasm of bronchus or lung; Small cell lung cancer, staging; Omnipaque  300; 100 cc ; Bilateral lung cancer; Dx'd 5 years ago; Chemo, XRT complete; No complaints FINDINGS: MEDIASTINUM: Heart and pericardium are unremarkable. The central airways are clear. Moderate 3-vessel coronary atherosclerosis. LYMPH NODES: No mediastinal, hilar or axillary lymphadenopathy. LUNGS AND PLEURA: No focal consolidation or pulmonary edema. No pleural effusion or pneumothorax. Radiation changes in the medial right hemithorax. SOFT TISSUES/BONES: No acute abnormality of the soft tissues. Status post vertebral augmentation at L2. Mild-to-moderate inferior endplate compression fracture deformity at T8 (sagittal image  98), new. Moderate anterior compression fracture deformity at T10, chronic. UPPER ABDOMEN: Limited images of the upper abdomen demonstrates no acute abnormality. Status post cholecystectomy. IMPRESSION: 1. No evidence of recurrent or metastatic disease. Radiation changes in the medial right hemithorax. 2. New mild-to-moderate inferior endplate compression fracture deformity at T8. Electronically signed by: Zadie Herter MD 01/26/2024 10:29 PM EDT RP Workstation: [id number]    Microbiology: Results for orders placed or performed during the hospital encounter of 02/02/24  Surgical PCR screen     Status: None   Collection Time: 02/03/24  2:44 AM   Specimen: Nasal Mucosa;  Nasal Swab  Result Value Ref Range Status   MRSA, PCR NEGATIVE NEGATIVE Final   Staphylococcus aureus NEGATIVE NEGATIVE Final    Comment: (NOTE) The Xpert SA Assay (FDA approved for NASAL specimens in patients 2 years of age and older), is one component of a comprehensive surveillance program. It is not intended to diagnose infection nor to guide or monitor treatment. Performed at [facility], [address], [place], [place] [address]     Labs: CBC: Recent Labs  Lab 02/07/24 1015 02/09/24 0728  WBC 6.3 5.6  HGB 10.2* 10.0*  HCT 31.5* 31.7*  MCV 98.4 98.8  PLT 163 211   Basic Metabolic Panel: Recent Labs  Lab 02/07/24 1015 02/08/24 1227 02/09/24 0728  NA 132*  --  137  K 3.4*  --  4.0  CL 95*  --  101  CO2 26  --  25  GLUCOSE 91  --  68*  BUN 10  --  10  CREATININE 0.72  --  0.77  CALCIUM  8.6*  --  8.9  MG  --  2.2  --    Liver Function Tests: Recent Labs  Lab 02/09/24 0728  AST 23  ALT 13  ALKPHOS 48  BILITOT 1.1  PROT 6.1*  ALBUMIN 3.1*   CBG: No results for input(s): GLUCAP in the last 168 hours.  Discharge time spent: greater than 30 minutes.  Signed: Ozell Blunt, MD [facility] 02/12/2024

## 2024-02-12 DIAGNOSIS — L299 Pruritus, unspecified: Secondary | ICD-10-CM | POA: Diagnosis not present

## 2024-02-12 DIAGNOSIS — D519 Vitamin B12 deficiency anemia, unspecified: Secondary | ICD-10-CM | POA: Diagnosis not present

## 2024-02-12 DIAGNOSIS — D529 Folate deficiency anemia, unspecified: Secondary | ICD-10-CM | POA: Diagnosis not present

## 2024-02-12 DIAGNOSIS — E782 Mixed hyperlipidemia: Secondary | ICD-10-CM | POA: Diagnosis not present

## 2024-02-12 DIAGNOSIS — Z923 Personal history of irradiation: Secondary | ICD-10-CM | POA: Diagnosis not present

## 2024-02-12 DIAGNOSIS — I10 Essential (primary) hypertension: Secondary | ICD-10-CM | POA: Diagnosis not present

## 2024-02-12 DIAGNOSIS — R112 Nausea with vomiting, unspecified: Secondary | ICD-10-CM | POA: Diagnosis not present

## 2024-02-12 DIAGNOSIS — Z9221 Personal history of antineoplastic chemotherapy: Secondary | ICD-10-CM | POA: Diagnosis not present

## 2024-02-12 DIAGNOSIS — R202 Paresthesia of skin: Secondary | ICD-10-CM | POA: Diagnosis not present

## 2024-02-12 DIAGNOSIS — F17200 Nicotine dependence, unspecified, uncomplicated: Secondary | ICD-10-CM | POA: Diagnosis not present

## 2024-02-12 DIAGNOSIS — Z4789 Encounter for other orthopedic aftercare: Secondary | ICD-10-CM | POA: Diagnosis not present

## 2024-02-12 DIAGNOSIS — R531 Weakness: Secondary | ICD-10-CM | POA: Diagnosis not present

## 2024-02-12 DIAGNOSIS — R32 Unspecified urinary incontinence: Secondary | ICD-10-CM | POA: Diagnosis not present

## 2024-02-12 DIAGNOSIS — M1990 Unspecified osteoarthritis, unspecified site: Secondary | ICD-10-CM | POA: Diagnosis not present

## 2024-02-12 DIAGNOSIS — E860 Dehydration: Secondary | ICD-10-CM | POA: Diagnosis not present

## 2024-02-12 DIAGNOSIS — M25552 Pain in left hip: Secondary | ICD-10-CM | POA: Diagnosis not present

## 2024-02-12 DIAGNOSIS — C4310 Malignant melanoma of unspecified eyelid, including canthus: Secondary | ICD-10-CM | POA: Diagnosis not present

## 2024-02-12 DIAGNOSIS — E519 Thiamine deficiency, unspecified: Secondary | ICD-10-CM | POA: Diagnosis not present

## 2024-02-12 DIAGNOSIS — Z9181 History of falling: Secondary | ICD-10-CM | POA: Diagnosis not present

## 2024-02-12 DIAGNOSIS — F1090 Alcohol use, unspecified, uncomplicated: Secondary | ICD-10-CM | POA: Diagnosis not present

## 2024-02-12 DIAGNOSIS — S72002D Fracture of unspecified part of neck of left femur, subsequent encounter for closed fracture with routine healing: Secondary | ICD-10-CM | POA: Diagnosis not present

## 2024-02-12 DIAGNOSIS — L981 Factitial dermatitis: Secondary | ICD-10-CM | POA: Diagnosis not present

## 2024-02-12 DIAGNOSIS — E876 Hypokalemia: Secondary | ICD-10-CM | POA: Diagnosis not present

## 2024-02-12 DIAGNOSIS — I129 Hypertensive chronic kidney disease with stage 1 through stage 4 chronic kidney disease, or unspecified chronic kidney disease: Secondary | ICD-10-CM | POA: Diagnosis not present

## 2024-02-12 DIAGNOSIS — R4189 Other symptoms and signs involving cognitive functions and awareness: Secondary | ICD-10-CM | POA: Diagnosis not present

## 2024-02-12 DIAGNOSIS — Z2989 Encounter for other specified prophylactic measures: Secondary | ICD-10-CM | POA: Diagnosis not present

## 2024-02-12 DIAGNOSIS — N1831 Chronic kidney disease, stage 3a: Secondary | ICD-10-CM | POA: Diagnosis not present

## 2024-02-12 DIAGNOSIS — S72002A Fracture of unspecified part of neck of left femur, initial encounter for closed fracture: Secondary | ICD-10-CM | POA: Diagnosis not present

## 2024-02-12 DIAGNOSIS — E559 Vitamin D deficiency, unspecified: Secondary | ICD-10-CM | POA: Diagnosis not present

## 2024-02-12 DIAGNOSIS — R451 Restlessness and agitation: Secondary | ICD-10-CM | POA: Diagnosis not present

## 2024-02-12 DIAGNOSIS — Z7401 Bed confinement status: Secondary | ICD-10-CM | POA: Diagnosis not present

## 2024-02-12 DIAGNOSIS — R410 Disorientation, unspecified: Secondary | ICD-10-CM | POA: Diagnosis not present

## 2024-02-12 NOTE — TOC Progression Note (Signed)
 Transition of Care [facility]) - Progression Note    Patient Details  Name: [name] [name] MRN: [id number] Date of Birth: 15-Mar-1951  Transition of [facility]) CM/SW Contact  Bari Leys, RN Phone Number: 02/12/2024, 10:25 AM  Clinical Narrative: LATE ENTRY 02/11/2024 5:18pm Call received from floor nurse, stating she received a call from patient's sister stating she is not taking patient to [facility] today because she does not like the facility. NCM called to [facility] to notify patient not transferring today.  NCM called to [facility] for cancellation of transport. NCM will follow up tomorrow to review with patient's sister for new SNF choice.       Expected Discharge Plan: Home w Home Health Services Barriers to Discharge: Continued Medical Work up  Expected Discharge Plan and Services In-house Referral: Clinical Social Work   Post Acute Care Choice: Home Health Living arrangements for the past 2 months: Single Family Home Expected Discharge Date: 02/12/24               DME Arranged: N/A DME Agency: NA       HH Arranged: PT, OT HH Agency: [facility] Date HH Agency Contacted: 02/04/24 Time HH Agency Contacted: 1506 Representative spoke with at [facility] Agency: Loetta Ringer   Social Determinants of Health (SDOH) Interventions SDOH Screenings   Food Insecurity: No Food Insecurity (02/02/2024)  Housing: Low Risk  (02/02/2024)  Transportation Needs: No Transportation Needs (02/02/2024)  Utilities: Not At Risk (02/02/2024)  Alcohol  Screen: Low Risk  (04/22/2023)  Depression (PHQ2-9): Low Risk  (07/13/2023)  Financial Resource Strain: Low Risk  (04/22/2023)  Physical Activity: Inactive (04/22/2023)  Social Connections: Socially Isolated (02/02/2024)  Stress: No Stress Concern Present (04/22/2023)  Tobacco Use: Medium Risk (02/03/2024)  Health Literacy: Adequate Health Literacy (04/22/2023)    Readmission Risk Interventions    02/04/2024    3:04 PM 02/04/2024     1:48 PM  Readmission Risk Prevention Plan  Transportation Screening Complete Complete  PCP or Specialist Appt within 5-7 Days  Complete  Home Care Screening  Complete  Medication Review (RN CM)  Complete

## 2024-02-12 NOTE — Progress Notes (Signed)
 Physical Therapy Treatment Patient Details Name: [name] [name] MRN: [id number] DOB: 1950/09/06 Today's Date: 02/12/2024   History of Present Illness Pricilla Vaneaton is a 73 yo female s/p fall with L femoral neck fx. Underwent L hip ORIF 02/03/24. Now WBAT with no precautions and f/u ortho MD in 2 weeks. PMH: ETOH use, , CKD3a, HTN, HLD,  B12 deficiency, osteoporosis with prior vertebral fracture, dehydration,cognitive impairment,  limited SCLC s/p chemo, radiation, prophylactic cranial radiation, CAD by imaging    PT Comments  Pt ambulated 25' x 2 with RW with ongoing verbal cues for safe positioning in RW and for posture. Pt performed L hip exercises with min assist, she reported no pain with exercise. Increased overall ambulation distance today.     If plan is discharge home, recommend the following: A lot of help with walking and/or transfers;A little help with bathing/dressing/bathroom;Assistance with cooking/housework;Assist for transportation;Help with stairs or ramp for entrance   Can travel by private vehicle     Yes  Equipment Recommendations  None recommended by PT    Recommendations for Other Services       Precautions / Restrictions Precautions Precautions: Fall Recall of Precautions/Restrictions: Impaired Precaution/Restrictions Comments: h/o cognitive impairment Restrictions LLE Weight Bearing Per Provider Order: Weight bearing as tolerated     Mobility  Bed Mobility Overal bed mobility: Needs Assistance Bed Mobility: Supine to Sit     Supine to sit: Min assist, HOB elevated     General bed mobility comments: min  A to pivot hips to EOB    Transfers Overall transfer level: Needs assistance Equipment used: Rolling walker (2 wheels) Transfers: Sit to/from Stand Sit to Stand: Min assist           General transfer comment: sit to stand x 3, VCs for hand placement with every trial (no apparent carry over), min A to power up and to steady  initially    Ambulation/Gait Ambulation/Gait assistance: Contact guard assist Gait Distance (Feet): 25 Feet Assistive device: Rolling walker (2 wheels) Gait Pattern/deviations: Step-to pattern, Decreased step length - right, Decreased step length - left, Shuffle, Trunk flexed Gait velocity: decreased     General Gait Details: 25' x 2 with RW and seated rest break, ongoing encouragement to walk required, ongoing VCs for proximity to RW and for posture   Stairs             Wheelchair Mobility     Tilt Bed    Modified Rankin (Stroke Patients Only)       Balance Overall balance assessment: Needs assistance Sitting-balance support: No upper extremity supported, Feet supported Sitting balance-Leahy Scale: Good     Standing balance support: Bilateral upper extremity supported, Reliant on assistive device for balance Standing balance-Leahy Scale: Fair Standing balance comment: High fall risk during dynamic tasks with RW                            Communication Communication Communication: No apparent difficulties  Cognition Arousal: Alert     PT - Cognitive impairments: History of cognitive impairments, Safety/Judgement, Memory                       PT - Cognition Comments: Pt requires increased time to complete tasks and reduce anxiety/fear of falling; requires ongoing verbal cues for safety, no carry over Following commands: Impaired Following commands impaired: Follows one step commands with increased time, Follows one step commands inconsistently  Cueing Cueing Techniques: Verbal cues, Gestural cues, Tactile cues  Exercises General Exercises - Lower Extremity Ankle Circles/Pumps: AROM, Both, Supine, 15 reps Heel Slides: AAROM, Left, Supine, 10 reps Hip ABduction/ADduction: AAROM, Left, Supine, 10 reps    General Comments        Pertinent Vitals/Pain Pain Assessment Faces Pain Scale: Hurts a little bit Pain Location: L hip with  activity Pain Descriptors / Indicators: Discomfort Pain Intervention(s): Limited activity within patient's tolerance, Monitored during session, Premedicated before session, Repositioned (pt refused ice)    Home Living                          Prior Function            PT Goals (current goals can now be found in the care plan section) Acute Rehab PT Goals Patient Stated Goal: HOME to my dog PT Goal Formulation: With patient Time For Goal Achievement: 02/26/24 Potential to Achieve Goals: Fair Progress towards PT goals: Progressing toward goals    Frequency    Min 5X/week      PT Plan      Co-evaluation              AM-PAC PT 6 Clicks Mobility   Outcome Measure  Help needed turning from your back to your side while in a flat bed without using bedrails?: A Little Help needed moving from lying on your back to sitting on the side of a flat bed without using bedrails?: A Little Help needed moving to and from a bed to a chair (including a wheelchair)?: A Little Help needed standing up from a chair using your arms (e.g., wheelchair or bedside chair)?: A Little Help needed to walk in hospital room?: A Little Help needed climbing 3-5 steps with a railing? : A Lot 6 Click Score: 17    End of Session Equipment Utilized During Treatment: Gait belt Activity Tolerance: Patient tolerated treatment well;Patient limited by fatigue Patient left: in chair;with call bell/phone within reach;with chair alarm set;with family/visitor present Nurse Communication: Mobility status PT Visit Diagnosis: Unsteadiness on feet (R26.81);Difficulty in walking, not elsewhere classified (R26.2);Pain Pain - Right/Left: Left Pain - part of body: Hip     Time: 0930-1002 PT Time Calculation (min) (ACUTE ONLY): 32 min  Charges:    $Gait Training: 8-22 mins $Therapeutic Exercise: 8-22 mins PT General Charges $$ ACUTE PT VISIT: 1 Visit                    Daymon Evans  PT 02/12/2024  Acute Rehabilitation Services  Office [phone]

## 2024-02-12 NOTE — TOC Transition Note (Signed)
 Transition of Care [facility]) - Discharge Note   Patient Details  Name: [name] [name] MRN: [id number] Date of Birth: 12/30/1950  Transition of Care [facility]) CM/SW Contact:  Amaryllis Junior, LCSW Phone Number: 02/12/2024, 10:54 AM   Clinical Narrative:    CSW spoke with pt sister. Sister decided to send pt to [facility] as previously planned. PTAR called at 10:14am. DC packet left at RN station. No further TOC needs.   Final next level of care: Skilled Nursing Facility Barriers to Discharge: Continued Medical Work up   Patient Goals and CMS Choice Patient states their goals for this hospitalization and ongoing recovery are:: return home          Discharge Placement                Patient to be transferred to facility by: PTAR Name of family member notified: Robbins,[name] (Sister)  [phone] (Mobile) Patient and family notified of of transfer: 02/11/24  Discharge Plan and Services Additional resources added to the After Visit Summary for   In-house Referral: Clinical Social Work   Post Acute Care Choice: Home Health          DME Arranged: N/A DME Agency: NA       HH Arranged: PT, OT HH Agency: [facility] Date HH Agency Contacted: 02/04/24 Time HH Agency Contacted: 1506 Representative spoke with at [facility] Agency: Loetta Ringer  Social Drivers of Health (SDOH) Interventions SDOH Screenings   Food Insecurity: No Food Insecurity (02/02/2024)  Housing: Low Risk  (02/02/2024)  Transportation Needs: No Transportation Needs (02/02/2024)  Utilities: Not At Risk (02/02/2024)  Alcohol  Screen: Low Risk  (04/22/2023)  Depression (PHQ2-9): Low Risk  (07/13/2023)  Financial Resource Strain: Low Risk  (04/22/2023)  Physical Activity: Inactive (04/22/2023)  Social Connections: Socially Isolated (02/02/2024)  Stress: No Stress Concern Present (04/22/2023)  Tobacco Use: Medium Risk (02/03/2024)  Health Literacy: Adequate Health Literacy (04/22/2023)     Readmission Risk  Interventions    02/04/2024    3:04 PM 02/04/2024    1:48 PM  Readmission Risk Prevention Plan  Transportation Screening Complete Complete  PCP or Specialist Appt within 5-7 Days  Complete  Home Care Screening  Complete  Medication Review (RN CM)  Complete

## 2024-02-12 NOTE — Progress Notes (Signed)
 Called and report given to nurse.

## 2024-02-12 NOTE — Plan of Care (Signed)
  Problem: Activity: Goal: Risk for activity intolerance will decrease Outcome: Progressing   Problem: Safety: Goal: Ability to remain free from injury will improve Outcome: Progressing   Problem: Pain Managment: Goal: General experience of comfort will improve and/or be controlled Outcome: Progressing

## 2024-02-12 NOTE — Progress Notes (Signed)
 [facility]  PROGRESS NOTE  [name] [name] JYN:[id number] DOB: 1951-02-18 DOA: 02/02/2024 PCP: [name], MD   Brief HPI:   73 y.o. female with hx of limited SCLC s/p chemo, radiation, prophylactic cranial radiation, CAD by imaging, mild cognitive impairment, alcohol  use disorder, CKD3a, HTN, HLD,  B12 deficiency, osteoporosis with prior vertebral fracture, who presents after ground level fall.  She was admitted after mechanical fall tripping on her dog.  She was found to have left hip fracture. Reports ongoing heavy alcohol  use with 1-2 bottles of wine daily     Assessment/Plan:   Left femoral neck fracture   status post cannulated screw fixation  02/03/2024. She is weightbearing as tolerated DVT prophylaxis with aspirin  81 mg twice a day for 4 weeks per Ortho  Follow-up with Ortho 2 weeks after surgery Pain control with Tylenol , oxycodone  Vitamin D  level 23.71 Continue vitamin D  repletion Physical therapy is recommending SNF and she is agreeable to go.   Hospital-acquired delirium  -Resolved -delirium in the setting of etoh use. UA negative no other signs of infection. -Continue as needed Atarax .  Avoid Ativan    Alcohol  use disorder -she drinks 1-2 bottles of wine per day continue multivitamin and folate   SCLC s/p chemo, radiation, prophylactic cranial radiation:   Has seen Dr. Liam Redhead and no evidence of recurrent disease. On surveillance outpatient.    CAD by imaging/HLD:   On repatha  outpatient. Continue rosuvastatin .    mild cognitive impairment:  stable   CKD3a:  Baseline Cr ~ 0.8 - 1.   HTN:  Not on antihypertensives.  Started on  Coreg    B12 deficiency:  b 12 578   Hypokalemia  replete    Constipation -senna miralax  Dulcolax supp   Excoriation rash - Patient has rash on her back due to excoriation, she has been scratching her back with a back scratcher - Improved today after giving hydroxyzine  yesterday - Can use Benadryl  or  hydroxyzine  as needed for itching    Medications     acetaminophen  (TYLENOL ) oral liquid 160 mg/5 mL  1,000 mg Oral Q8H   aspirin  EC  81 mg Oral BID   bisacodyl   10 mg Oral Daily   carvedilol   12.5 mg Oral BID WC   cholecalciferol   1,000 Units Oral Daily   citalopram   20 mg Oral Daily   folic acid   1 mg Oral Daily   multivitamin with minerals  1 tablet Oral Daily   ondansetron  (ZOFRAN ) IV  4 mg Intravenous Q6H   polyethylene glycol  17 g Oral Daily   raloxifene   60 mg Oral Daily   rosuvastatin   20 mg Oral Daily   senna-docusate  2 tablet Oral QHS   thiamine   100 mg Oral Daily   Or   thiamine   100 mg Intravenous Daily   Vitamin D  (Ergocalciferol )  50,000 Units Oral Q7 days     Data Reviewed:   CBG:  No results for input(s): GLUCAP in the last 168 hours.  SpO2: 97 % O2 Flow Rate (L/min): 2 L/min    Vitals:   02/11/24 1255 02/11/24 1725 02/11/24 2111 02/12/24 0522  BP: 115/68 119/70 108/68 126/80  Pulse: 86 89 82 88  Resp: 18  18 18   Temp: 98.5 F (36.9 C) (!) 97.4 F (36.3 C) 97.7 F (36.5 C) 97.8 F (36.6 C)  TempSrc:  Oral Oral Oral  SpO2: 97% 97% 98% 97%  Weight:      Height:  Data Reviewed:  Basic Metabolic Panel: Recent Labs  Lab 02/07/24 1015 02/08/24 1227 02/09/24 0728  NA 132*  --  137  K 3.4*  --  4.0  CL 95*  --  101  CO2 26  --  25  GLUCOSE 91  --  68*  BUN 10  --  10  CREATININE 0.72  --  0.77  CALCIUM  8.6*  --  8.9  MG  --  2.2  --     CBC: Recent Labs  Lab 02/07/24 1015 02/09/24 0728  WBC 6.3 5.6  HGB 10.2* 10.0*  HCT 31.5* 31.7*  MCV 98.4 98.8  PLT 163 211    LFT Recent Labs  Lab 02/09/24 0728  AST 23  ALT 13  ALKPHOS 48  BILITOT 1.1  PROT 6.1*  ALBUMIN 3.1*     Antibiotics: Anti-infectives (From admission, onward)    Start     Dose/Rate Route Frequency Ordered Stop   02/03/24 1730  ceFAZolin  (ANCEF ) IVPB 2g/100 mL premix        2 g 200 mL/hr over 30 Minutes Intravenous Every 8 hours 02/03/24  1229 02/04/24 1641   02/03/24 0930  ceFAZolin  (ANCEF ) IVPB 2g/100 mL premix        2 g 200 mL/hr over 30 Minutes Intravenous On call to O.R. 02/03/24 0839 02/03/24 0950   02/03/24 0901  ceFAZolin  (ANCEF ) 2-4 GM/100ML-% IVPB       Note to Pharmacy: Lamonte Pimenta: cabinet override      02/03/24 0901 02/03/24 0946        DVT prophylaxis: Aspirin , per ortho  Code Status: Full code  Family Communication:    CONSULTS orthopedics   Subjective    Patient's sister refused her to be discharged to skilled facility yesterday.  Objective    Physical Examination:   General-appears in no acute distress Heart-S1-S2, regular, no murmur auscultated Lungs-clear to auscultation bilaterally, no wheezing or crackles auscultated Abdomen-soft, nontender, no organomegaly Extremities-no edema in the lower extremities Neuro-alert, oriented to self only Skin-fading maculopapular rash on back with marks of excoriation   Status is: Inpatient:             [name] [name] S Leander Tout   [facility] If 7PM-7AM, please contact night-coverage at [email or web address], Office  [phone]   02/12/2024, 7:51 AM  LOS: 10 days

## 2024-02-15 DIAGNOSIS — S72002D Fracture of unspecified part of neck of left femur, subsequent encounter for closed fracture with routine healing: Secondary | ICD-10-CM | POA: Diagnosis not present

## 2024-02-15 DIAGNOSIS — R451 Restlessness and agitation: Secondary | ICD-10-CM | POA: Diagnosis not present

## 2024-02-15 DIAGNOSIS — L299 Pruritus, unspecified: Secondary | ICD-10-CM | POA: Diagnosis not present

## 2024-02-19 ENCOUNTER — Telehealth: Payer: Self-pay

## 2024-02-19 NOTE — Telephone Encounter (Signed)
 Noted

## 2024-02-19 NOTE — Telephone Encounter (Signed)
 Copied from CRM [id number]. Topic: Clinical - Home Health Verbal Orders >> Feb 19, 2024  8:58 AM Mercedes MATSU wrote: Caller/Agency: [facility] > Bari Rushing Number: [phone] (not secure line) Service Requested: N/a Frequency: N/a Any new concerns about the patient? Yes, Patients contact called [facility] and wanted to push back PT appointment because she is not feeling well. Services will begin next.

## 2024-02-20 ENCOUNTER — Encounter (HOSPITAL_COMMUNITY): Payer: Self-pay | Admitting: Interventional Radiology

## 2024-02-23 ENCOUNTER — Telehealth: Payer: Self-pay

## 2024-02-23 DIAGNOSIS — L299 Pruritus, unspecified: Secondary | ICD-10-CM | POA: Diagnosis not present

## 2024-02-23 DIAGNOSIS — C3490 Malignant neoplasm of unspecified part of unspecified bronchus or lung: Secondary | ICD-10-CM | POA: Diagnosis not present

## 2024-02-23 DIAGNOSIS — I129 Hypertensive chronic kidney disease with stage 1 through stage 4 chronic kidney disease, or unspecified chronic kidney disease: Secondary | ICD-10-CM | POA: Diagnosis not present

## 2024-02-23 DIAGNOSIS — F418 Other specified anxiety disorders: Secondary | ICD-10-CM | POA: Diagnosis not present

## 2024-02-23 DIAGNOSIS — E782 Mixed hyperlipidemia: Secondary | ICD-10-CM | POA: Diagnosis not present

## 2024-02-23 DIAGNOSIS — K5909 Other constipation: Secondary | ICD-10-CM | POA: Diagnosis not present

## 2024-02-23 DIAGNOSIS — N1831 Chronic kidney disease, stage 3a: Secondary | ICD-10-CM | POA: Diagnosis not present

## 2024-02-23 DIAGNOSIS — I2510 Atherosclerotic heart disease of native coronary artery without angina pectoris: Secondary | ICD-10-CM | POA: Diagnosis not present

## 2024-02-23 DIAGNOSIS — G3184 Mild cognitive impairment, so stated: Secondary | ICD-10-CM | POA: Diagnosis not present

## 2024-02-23 DIAGNOSIS — G4700 Insomnia, unspecified: Secondary | ICD-10-CM | POA: Diagnosis not present

## 2024-02-23 DIAGNOSIS — R451 Restlessness and agitation: Secondary | ICD-10-CM | POA: Diagnosis not present

## 2024-02-23 DIAGNOSIS — F17200 Nicotine dependence, unspecified, uncomplicated: Secondary | ICD-10-CM | POA: Diagnosis not present

## 2024-02-23 DIAGNOSIS — F411 Generalized anxiety disorder: Secondary | ICD-10-CM | POA: Diagnosis not present

## 2024-02-23 DIAGNOSIS — E876 Hypokalemia: Secondary | ICD-10-CM | POA: Diagnosis not present

## 2024-02-23 DIAGNOSIS — Z85828 Personal history of other malignant neoplasm of skin: Secondary | ICD-10-CM | POA: Diagnosis not present

## 2024-02-23 DIAGNOSIS — F424 Excoriation (skin-picking) disorder: Secondary | ICD-10-CM | POA: Diagnosis not present

## 2024-02-23 DIAGNOSIS — F1090 Alcohol use, unspecified, uncomplicated: Secondary | ICD-10-CM | POA: Diagnosis not present

## 2024-02-23 DIAGNOSIS — E519 Thiamine deficiency, unspecified: Secondary | ICD-10-CM | POA: Diagnosis not present

## 2024-02-23 DIAGNOSIS — M1990 Unspecified osteoarthritis, unspecified site: Secondary | ICD-10-CM | POA: Diagnosis not present

## 2024-02-23 DIAGNOSIS — J449 Chronic obstructive pulmonary disease, unspecified: Secondary | ICD-10-CM | POA: Diagnosis not present

## 2024-02-23 DIAGNOSIS — K581 Irritable bowel syndrome with constipation: Secondary | ICD-10-CM | POA: Diagnosis not present

## 2024-02-23 DIAGNOSIS — D529 Folate deficiency anemia, unspecified: Secondary | ICD-10-CM | POA: Diagnosis not present

## 2024-02-23 DIAGNOSIS — E860 Dehydration: Secondary | ICD-10-CM | POA: Diagnosis not present

## 2024-02-23 DIAGNOSIS — M80052D Age-related osteoporosis with current pathological fracture, left femur, subsequent encounter for fracture with routine healing: Secondary | ICD-10-CM | POA: Diagnosis not present

## 2024-02-23 DIAGNOSIS — D519 Vitamin B12 deficiency anemia, unspecified: Secondary | ICD-10-CM | POA: Diagnosis not present

## 2024-02-23 NOTE — Telephone Encounter (Signed)
 Spoke w/ Etta- informed unable to give VO until Pt's appt on 03/07/24- last OV 06/2023. She will reach out to surgeons office.

## 2024-02-23 NOTE — Telephone Encounter (Signed)
 Copied from CRM [id number]. Topic: Clinical - Home Health Verbal Orders >> Feb 23, 2024  4:00 PM Berneda FALCON wrote: Caller/Agency: Etta with [facility] Callback Number: [phone] Service Requested: Skilled Nursing Frequency: 1x 6 weeks Any new concerns about the patient? No, but prescribed xanex which made her have hallucinations so she is setting up an appt

## 2024-02-25 DIAGNOSIS — M80052D Age-related osteoporosis with current pathological fracture, left femur, subsequent encounter for fracture with routine healing: Secondary | ICD-10-CM | POA: Diagnosis not present

## 2024-02-25 DIAGNOSIS — N1831 Chronic kidney disease, stage 3a: Secondary | ICD-10-CM | POA: Diagnosis not present

## 2024-02-25 DIAGNOSIS — E782 Mixed hyperlipidemia: Secondary | ICD-10-CM | POA: Diagnosis not present

## 2024-02-25 DIAGNOSIS — J449 Chronic obstructive pulmonary disease, unspecified: Secondary | ICD-10-CM | POA: Diagnosis not present

## 2024-02-25 DIAGNOSIS — C3490 Malignant neoplasm of unspecified part of unspecified bronchus or lung: Secondary | ICD-10-CM | POA: Diagnosis not present

## 2024-02-25 DIAGNOSIS — I129 Hypertensive chronic kidney disease with stage 1 through stage 4 chronic kidney disease, or unspecified chronic kidney disease: Secondary | ICD-10-CM | POA: Diagnosis not present

## 2024-02-29 ENCOUNTER — Telehealth: Payer: Self-pay

## 2024-02-29 DIAGNOSIS — F1010 Alcohol abuse, uncomplicated: Secondary | ICD-10-CM

## 2024-02-29 DIAGNOSIS — I10 Essential (primary) hypertension: Secondary | ICD-10-CM

## 2024-02-29 NOTE — Telephone Encounter (Signed)
 Copied from CRM [id number]. Topic: Clinical - Medical Advice >> Feb 29, 2024 12:57 PM Shereese L wrote: Reason for CRM: patient sister stated that she wanted a call back from the nurse or doctor in reference to the  upcoming appt 07/08

## 2024-02-29 NOTE — Telephone Encounter (Signed)
 Copied from CRM [id number]. Topic: Clinical - Medical Advice >> Feb 29, 2024  4:13 PM Powell HERO wrote: Reason for CRM: patients care giver is coming with her, her sister sharon cannot make it, patient was in rehab and now shes home and her sister is wanting to make the provider aware of whats going on with her before her appointment. Requests a call back today please, asap.

## 2024-03-01 ENCOUNTER — Other Ambulatory Visit: Payer: Self-pay | Admitting: Internal Medicine

## 2024-03-01 ENCOUNTER — Encounter: Payer: Self-pay | Admitting: Internal Medicine

## 2024-03-01 ENCOUNTER — Ambulatory Visit: Admitting: Internal Medicine

## 2024-03-01 ENCOUNTER — Telehealth: Payer: Self-pay

## 2024-03-01 VITALS — BP 116/78 | HR 60 | Resp 16 | Ht 67.0 in

## 2024-03-01 DIAGNOSIS — F418 Other specified anxiety disorders: Secondary | ICD-10-CM

## 2024-03-01 DIAGNOSIS — F1090 Alcohol use, unspecified, uncomplicated: Secondary | ICD-10-CM | POA: Diagnosis not present

## 2024-03-01 DIAGNOSIS — F1010 Alcohol abuse, uncomplicated: Secondary | ICD-10-CM

## 2024-03-01 DIAGNOSIS — I10 Essential (primary) hypertension: Secondary | ICD-10-CM

## 2024-03-01 DIAGNOSIS — E785 Hyperlipidemia, unspecified: Secondary | ICD-10-CM

## 2024-03-01 DIAGNOSIS — I129 Hypertensive chronic kidney disease with stage 1 through stage 4 chronic kidney disease, or unspecified chronic kidney disease: Secondary | ICD-10-CM | POA: Diagnosis not present

## 2024-03-01 DIAGNOSIS — N1831 Chronic kidney disease, stage 3a: Secondary | ICD-10-CM | POA: Diagnosis not present

## 2024-03-01 DIAGNOSIS — C3490 Malignant neoplasm of unspecified part of unspecified bronchus or lung: Secondary | ICD-10-CM | POA: Diagnosis not present

## 2024-03-01 DIAGNOSIS — F1027 Alcohol dependence with alcohol-induced persisting dementia: Secondary | ICD-10-CM

## 2024-03-01 DIAGNOSIS — J449 Chronic obstructive pulmonary disease, unspecified: Secondary | ICD-10-CM | POA: Diagnosis not present

## 2024-03-01 DIAGNOSIS — E782 Mixed hyperlipidemia: Secondary | ICD-10-CM | POA: Diagnosis not present

## 2024-03-01 DIAGNOSIS — M80052D Age-related osteoporosis with current pathological fracture, left femur, subsequent encounter for fracture with routine healing: Secondary | ICD-10-CM | POA: Diagnosis not present

## 2024-03-01 MED ORDER — CITALOPRAM HYDROBROMIDE 20 MG PO TABS: 20.0000 mg | ORAL_TABLET | Freq: Every day | ORAL | 5 refills | Status: DC

## 2024-03-01 NOTE — Assessment & Plan Note (Signed)
 Fall, femur fracture: Admitted to the hospital, subsequently went to a rehab facility, now at home. Pain well-controlled with Tylenol  only. HTN: Carvedilol  dose increased since the last time I saw her.  BP today looks good, no change.  Checking labs. Dementia: Previously MCI, although I have not done complete mental exam; today caregiver reports she is forgetful, short-term memory is minimal. The sister stopped Aricept  because it caused diarrhea. Depression anxiety: Currently on citalopram , RF sent. Was Rx Xanax  by the hospital team but she is not taking that anymore and caregiver reports she does not need it.  Avoid benzos due to EtOH Osteoporosis: Managed per gynecology, recommend to reach out to them. B12 deficiency: Last level satisfactory Vitamin D  deficiency: Last levels low, on supplements. EtOH: Heavy drinking prior to recent admission, now caregiver  dilutes alcohol  with water  and consumption is minimal.  Check folic acid  level. Aspirin  twice daily as a DVT prophylaxis, go back to once a day on July 20 Social: Luke, her caregiver is here today, she reports that she has supervision at home 24/7. RTC 4 months

## 2024-03-01 NOTE — Patient Instructions (Signed)
 After July 20, decrease aspirin  to only 81 mg once daily  Other medications the same  Please reach out to your gynecologist for management of osteoporosis  Blood work today  Set up an appointment in 4 months for a checkup.

## 2024-03-01 NOTE — Progress Notes (Signed)
 Subjective:    Patient ID: [name] [name], female    DOB: Feb 01, 1951, 73 y.o.   MRN: [id number]  DOS:  03/01/2024 Type of visit - description: Hospital follow-up.  Here with her caregiver Luke  Admitted to the hospital x 10 days and discharged February 12, 2024 - Fall, L femoral fracture.  Status post ORIF screw fixation 02/03/2024.   Aspirin  twice daily for 4 weeks for DVT prophylaxis.  Previously on 81 mg daily   Discharge on Vicodin for pain.  - EtOH: Hospital team noted she drinks 1 or 2 bottles of wine a day, had no withdrawal symptoms.  Discharge medications included Xanax , Vicodin.  After hospital stay, she went to a rehab facility and now is back home.  Her caregiver Luke is here with her, reports her memory is extremely poor. Sometimes gets agitated if she is not provided with alcohol . Pain at home is well-controlled with Tylenol  only. Appetite is slightly low. No nausea or vomiting. No chest pain or difficulty breathing.   Review of Systems See above   Past Medical History:  Diagnosis Date   Anemia    duering cancer treatment    Anxiety    Arthritis    OA hands    Bilateral lower extremity pain    Blood transfusion without reported diagnosis    during chemo for lung cancer   Cataract    forming  but very small    Cholelithiasis    Colon polyps    adenomatous   Concussion 08/25/2021   ETOHdependence (HCC)    High cholesterol    History of chemotherapy    for lung cancer- completed 2013   Hx of radiation therapy 10/07/11 to 11/20/11   right lung   Hx of radiation therapy 01/07/2012- 01/21/12   cranial irradiation   Hypertension    Lung cancer (HCC) 09/19/2011   Stage III currently in remission   Malignant melanoma of skin of canthus of right eye (HCC) 02/2017   Memory loss    Osteoporosis 04/2014   Dexa scan by Dr. Marget, T-score -2.7, next in 2 years, with at least 13 % major fracture in 10 years   Pitting edema    Bilateral lower extremities, duplex  ultrasound 04/03/2015 normal, no DVT    Past Surgical History:  Procedure Laterality Date   CHOLECYSTECTOMY  04/2011   biliary stent placement   CLOSED REDUCTION FINGER WITH PERCUTANEOUS PINNING Left 10/23/2021   Procedure: LEFT INDEX, MIDDLE, RING, AND SMALL FINGER CLOSED REDUCTION FINGER WITH PERCUTANEOUS PINNING;  Surgeon: Romona Harari, MD;  Location: [facility];  Service: Orthopedics;  Laterality: Left;   COLONOSCOPY     HIP PINNING,CANNULATED Left 02/03/2024   Procedure: Pinning, extremity percutaneous;  Surgeon: Edna Toribio LABOR, MD;  Location: [facility];  Service: Orthopedics;  Laterality: Left;   INCISIONAL HERNIA REPAIR  2015   IR KYPHO LUMBAR INC FX REDUCE BONE BX UNI/BIL CANNULATION INC/IMAGING  09/18/2022   MOHS SURGERY Right 02/2017   cheek and eyelid   [place] Planas Touch     Vaginal procedure   POLYPECTOMY     TUBAL LIGATION      Current Outpatient Medications  Medication Instructions   Acetaminophen  (TYLENOL  PO) 600 tablets, As needed   aspirin  EC 81 mg, Oral, 2 times daily, Swallow whole.   carvedilol  (COREG ) 12.5 mg, Oral, 2 times daily with meals   citalopram  (CELEXA ) 20 mg, Oral, Daily   Cyanocobalamin  (VITAMIN B-12 CR PO) 2 tablets,  Daily   donepezil  (ARICEPT ) 5 mg, Oral, Daily at bedtime   Evolocumab  (REPATHA  SURECLICK) 140 MG/ML SOAJ INJECT 140MG  UNDER THE SKIN ONCE EVERY 14 DAYS   folic acid  (FOLVITE ) 1 mg, Daily   raloxifene  (EVISTA ) 60 mg, Oral, Daily   rosuvastatin  (CRESTOR ) 40 MG tablet TAKE 1 TABLET(40 MG) BY MOUTH AT BEDTIME   senna-docusate (SENOKOT-S) 8.6-50 MG tablet 2 tablets, Oral, Daily at bedtime   thiamine  (VITAMIN B-1) 100 mg, Oral, Daily   Vitamin D  (Ergocalciferol ) (DRISDOL ) 50,000 Units, Oral, Every 7 days       Objective:   Physical Exam BP 116/78   Pulse 60   Resp 16   Ht 5' 7 (1.702 m)   SpO2 94%   BMI 23.17 kg/m  General:   Well developed, NAD, BMI noted. HEENT:  Normocephalic . Face symmetric,  atraumatic Lungs:  CTA B Normal respiratory effort, no intercostal retractions, no accessory muscle use. Heart: RRR,  no murmur.  Lower extremities: no pretibial edema bilaterally  Skin: Not pale. Not jaundice Neurologic:  alert & oriented to self, space, not to time. Speech normal, gait not tested psych--  Behavior appropriate. No anxious or depressed appearing.      Assessment    ASSESSMENT  HTN Hyperlipidemia -- cramps with Lipitor dc 2015 Depression . Anxiety (  avoid benzos d/t etoh Osteoporosis-- DEXAs  at gyn --->  was on boniva, now evista   B12 deficiency dx 01/2014 Vit D  def  COPD: Mild last PFTs  CV: Extensive coronary artery calcifications Oncology: -Lung cancer SCC DX 2013, XRT chest and brain -melanoma 2018, R face, s/p Mohs Lower extremity edema US  (-) for DVT 03-2015 Coronary calcifications per CT chest 6 2016 IBS, diarrhea predominant   on Imodium as needed; GI 2015-- rx questran , self d/c d/t constipation ASA intolerant (tinnitus) but ok  taking low-dose Admitted 07-2020: Had a fall, L pubic rami fracture Admitted 08-2021: Fall, headache, EtOH, MS changes    PLAN Fall, femur fracture: Admitted to the hospital, subsequently went to a rehab facility, now at home. Pain well-controlled with Tylenol  only. HTN: Carvedilol  dose increased since the last time I saw her.  BP today looks good, no change.  Checking labs. Dementia: Previously MCI, although I have not done complete mental exam; today caregiver reports she is forgetful, short-term memory is minimal. The sister stopped Aricept  because it caused diarrhea. Depression anxiety: Currently on citalopram , RF sent. Was Rx Xanax  by the hospital team but she is not taking that anymore and caregiver reports she does not need it.  Avoid benzos due to EtOH Osteoporosis: Managed per gynecology, recommend to reach out to them. B12 deficiency: Last level satisfactory Vitamin D  deficiency: Last levels low, on  supplements. EtOH: Heavy drinking prior to recent admission, now caregiver  dilutes alcohol  with water  and consumption is minimal.  Check folic acid  level. Aspirin  twice daily as a DVT prophylaxis, go back to once a day on July 20 Social: Luke, her caregiver is here today, she reports that she has supervision at home 24/7. RTC 4 months Time spent 55 minutes, extensive chart review

## 2024-03-01 NOTE — Telephone Encounter (Signed)
 Copied from CRM [id number]. Topic: Clinical - Medical Advice >> Mar 01, 2024  4:15 PM DeAngela L wrote: Reason for CRM: Patient was in the office today and her POA and medical POA would like to ask if the doctor was informed about the patient having anxiety concerns that she didn't get anything for her agitation and restlessness and she would like to ask if the provider can prescribed her a prescription to help with the agitation and restlessness concerns and the sis ter is asking is this is ok or is there another alternative help with patient concerns  Sister Reena Simpers number [phone]   Please advise.

## 2024-03-01 NOTE — Telephone Encounter (Signed)
 Patient seen today, will communicate with the family once results are available

## 2024-03-01 NOTE — Telephone Encounter (Signed)
 AVS mailed to below address.

## 2024-03-01 NOTE — Telephone Encounter (Signed)
 Has fractured hip/femur, just gotten out of rehab at [facility] center.  Discharge center marked her with 4 new diagnoses that sister is questionning.  1.  COPD,  2. CKD-stage 3, 3.  HTN, chronic. 4. Nicotine  dependant. Sister wants to verify if all of these new dx are accurate.  Was without Celexa  for 6 days in hospital. Was restarted at [facility] but will take last one today.  Tried Atarax , Ativan  and Xanax (makes her talk out of her head) and pt did not tolerate either.  Is requesting something to help with pt's agitation other than the above options. Wonders if any of these options would be appropriate? Xyprexa, Librium, Buspirone.  They are trying to get pt off of alcohol . Sister Has been diluting alcohol  at home and says pt is getting 1 glass a day.  Wants to verify Coreg  dose. Sent home with 12.5mg  twice a day at d/c. Was taking 6.25mg  once qam before hospital stay.   How often to take ASA? D/C instruction says 2 times daily for 28 days?.  Aricept  was causing diarrhea and pt stopped medication.   They are keeping pt at home with 24hr  care.

## 2024-03-01 NOTE — Telephone Encounter (Signed)
 Copied from CRM [id number]. Topic: General - Other >> Mar 01, 2024  1:27 PM Abigail D wrote: Reason for CRM: Patient's sister and POA would like the post-visit summary 03/01/24 mailed to her home address.  Reena Simpers [address]  [place], [place] [address]

## 2024-03-02 ENCOUNTER — Telehealth: Payer: Self-pay

## 2024-03-02 DIAGNOSIS — J449 Chronic obstructive pulmonary disease, unspecified: Secondary | ICD-10-CM | POA: Diagnosis not present

## 2024-03-02 DIAGNOSIS — N1831 Chronic kidney disease, stage 3a: Secondary | ICD-10-CM | POA: Diagnosis not present

## 2024-03-02 DIAGNOSIS — C3490 Malignant neoplasm of unspecified part of unspecified bronchus or lung: Secondary | ICD-10-CM | POA: Diagnosis not present

## 2024-03-02 DIAGNOSIS — I129 Hypertensive chronic kidney disease with stage 1 through stage 4 chronic kidney disease, or unspecified chronic kidney disease: Secondary | ICD-10-CM | POA: Diagnosis not present

## 2024-03-02 DIAGNOSIS — E782 Mixed hyperlipidemia: Secondary | ICD-10-CM | POA: Diagnosis not present

## 2024-03-02 DIAGNOSIS — M80052D Age-related osteoporosis with current pathological fracture, left femur, subsequent encounter for fracture with routine healing: Secondary | ICD-10-CM | POA: Diagnosis not present

## 2024-03-02 MED ORDER — GABAPENTIN 100 MG PO CAPS: 100.0000 mg | ORAL_CAPSULE | Freq: Two times a day (BID) | ORAL | 0 refills | Status: DC | PRN

## 2024-03-02 NOTE — Addendum Note (Signed)
 Addended by: AMON SCHANZ E on: 03/02/2024 04:08 PM   Modules accepted: Orders

## 2024-03-02 NOTE — Telephone Encounter (Signed)
 Spoke with [name] [name] at [facility] today.  She was alcohol  free during her stay in the hospital at the nursing home, unfortunately once she went back home she insisted so much from drinking wine that they decided to give her a very small amount as needed.  States that [name] is agitated on and off, she thinks she is somewhat depressed.  Requesting medication to use as needed. Reportedly Atarax , Xanax , Ativan  did not work. Options include Seroquel : Risk of increased QTc Trazodone : Risk of possible serotonin syndrome. Plan: Gabapentin  100 mg bid prn

## 2024-03-02 NOTE — Telephone Encounter (Signed)
 Tried talking to Pt's sister Reena, she interrupts during the conversation multiple times. I tried giving her PCP recommendations. She states that [employer] know anything about medications and spoke out of turn, the citalopram  isn't working as she has been on it for 12 + years and Pt's body is just used to it. Reena stated she sent a list of 3 different medications for anxiety to her appt yesterday that she wanted PCP to consider, I informed I wasn't here yesterday, 7/8 so unsure if it was received or not. Informed I'd try to have PCP call her and have conversation. Informed that Pt didn't stop by lab on the way out of office yesterday, Reena states no one told us  to go. Then states she wasn't at visit yesterday so unsure what was discussed. She informed that Pt is very busy, she has PT/OT coming to home and other appts going on. Informed that it is important that we f/u on needed blood work since she was discharged from hospital. We scheduled a lab appt for 03/07/24 at 1:30pm.    Reena can be reached at [phone].

## 2024-03-02 NOTE — Addendum Note (Signed)
 Addended by: Falon Huesca D on: 03/02/2024 03:39 PM   Modules accepted: Orders

## 2024-03-02 NOTE — Telephone Encounter (Signed)
 Okay to give verbal orders?  ?

## 2024-03-02 NOTE — Telephone Encounter (Signed)
 Copied from CRM [id number]. Topic: Clinical - Home Health Verbal Orders >> Mar 02, 2024  2:20 PM Zenovia PARAS wrote: Caller/Agency: [facility] Callback Number: [phone] Service Requested: PT Physical Therapy Frequency: 2 week 2 1 week 3 starting next week  Any new concerns about the patient? No

## 2024-03-02 NOTE — Telephone Encounter (Signed)
 LMOM for [facility]- verbal orders given.

## 2024-03-02 NOTE — Addendum Note (Signed)
 Addended by: AMON SCHANZ E on: 03/02/2024 03:13 PM   Modules accepted: Orders

## 2024-03-02 NOTE — Telephone Encounter (Signed)
 Please call patient's sister. - Patient was seen June 8,  reports available on MyChart. - Did not stop for blood work, needs to come back at her convenience -For anxiety, I refilled citalopram . - We could consider a medication for agitation such as Seroquel  but I was told by Luke, the caregiver,  that as long as she takes citalopram  she is okay. - To go back on aspirin  once a day only around July 20. - Set up a virtual visit if further advice is needed

## 2024-03-02 NOTE — Addendum Note (Signed)
 Addended by: Jill Stopka D on: 03/02/2024 03:27 PM   Modules accepted: Orders

## 2024-03-02 NOTE — Telephone Encounter (Signed)
Yes, thx

## 2024-03-03 ENCOUNTER — Other Ambulatory Visit: Payer: Self-pay

## 2024-03-03 DIAGNOSIS — S72042D Displaced fracture of base of neck of left femur, subsequent encounter for closed fracture with routine healing: Secondary | ICD-10-CM | POA: Diagnosis not present

## 2024-03-03 MED ORDER — CARVEDILOL 12.5 MG PO TABS: 12.5000 mg | ORAL_TABLET | Freq: Two times a day (BID) | ORAL | 1 refills | Status: DC

## 2024-03-04 ENCOUNTER — Telehealth: Payer: Self-pay

## 2024-03-04 DIAGNOSIS — C3490 Malignant neoplasm of unspecified part of unspecified bronchus or lung: Secondary | ICD-10-CM | POA: Diagnosis not present

## 2024-03-04 DIAGNOSIS — E782 Mixed hyperlipidemia: Secondary | ICD-10-CM | POA: Diagnosis not present

## 2024-03-04 DIAGNOSIS — M80052D Age-related osteoporosis with current pathological fracture, left femur, subsequent encounter for fracture with routine healing: Secondary | ICD-10-CM | POA: Diagnosis not present

## 2024-03-04 DIAGNOSIS — N1831 Chronic kidney disease, stage 3a: Secondary | ICD-10-CM | POA: Diagnosis not present

## 2024-03-04 DIAGNOSIS — I129 Hypertensive chronic kidney disease with stage 1 through stage 4 chronic kidney disease, or unspecified chronic kidney disease: Secondary | ICD-10-CM | POA: Diagnosis not present

## 2024-03-04 DIAGNOSIS — J449 Chronic obstructive pulmonary disease, unspecified: Secondary | ICD-10-CM | POA: Diagnosis not present

## 2024-03-04 NOTE — Telephone Encounter (Signed)
 Physician orders signed and faxed back to [facility] at [phone]. Form sent for scanning.

## 2024-03-07 ENCOUNTER — Inpatient Hospital Stay: Admitting: Internal Medicine

## 2024-03-07 ENCOUNTER — Other Ambulatory Visit (INDEPENDENT_AMBULATORY_CARE_PROVIDER_SITE_OTHER)

## 2024-03-07 DIAGNOSIS — I10 Essential (primary) hypertension: Secondary | ICD-10-CM | POA: Diagnosis not present

## 2024-03-07 DIAGNOSIS — F1010 Alcohol abuse, uncomplicated: Secondary | ICD-10-CM | POA: Diagnosis not present

## 2024-03-07 LAB — COMPREHENSIVE METABOLIC PANEL WITH GFR
ALT: 9 U/L (ref 0–35)
AST: 17 U/L (ref 0–37)
Albumin: 4 g/dL (ref 3.5–5.2)
Alkaline Phosphatase: 56 U/L (ref 39–117)
BUN: 11 mg/dL (ref 6–23)
CO2: 26 meq/L (ref 19–32)
Calcium: 9.6 mg/dL (ref 8.4–10.5)
Chloride: 101 meq/L (ref 96–112)
Creatinine, Ser: 0.99 mg/dL (ref 0.40–1.20)
GFR: 57.55 mL/min — ABNORMAL LOW (ref >60.00)
Glucose, Bld: 102 mg/dL — ABNORMAL HIGH (ref 70–99)
Potassium: 3.9 meq/L (ref 3.5–5.1)
Sodium: 135 meq/L (ref 135–145)
Total Bilirubin: 0.5 mg/dL (ref 0.2–1.2)
Total Protein: 6.4 g/dL (ref 6.0–8.3)

## 2024-03-07 LAB — CBC WITH DIFFERENTIAL/PLATELET
Basophils Absolute: 0 K/uL (ref 0.0–0.1)
Basophils Relative: 0.6 % (ref 0.0–3.0)
Eosinophils Absolute: 0.1 K/uL (ref 0.0–0.7)
Eosinophils Relative: 1.6 % (ref 0.0–5.0)
HCT: 37.8 % (ref 36.0–46.0)
Hemoglobin: 12.5 g/dL (ref 12.0–15.0)
Lymphocytes Relative: 11 % — ABNORMAL LOW (ref 12.0–46.0)
Lymphs Abs: 0.7 K/uL (ref 0.7–4.0)
MCHC: 33 g/dL (ref 30.0–36.0)
MCV: 93.2 fl (ref 78.0–100.0)
Monocytes Absolute: 0.4 K/uL (ref 0.1–1.0)
Monocytes Relative: 6.4 % (ref 3.0–12.0)
Neutro Abs: 5.1 K/uL (ref 1.4–7.7)
Neutrophils Relative %: 80.4 % — ABNORMAL HIGH (ref 43.0–77.0)
Platelets: 201 K/uL (ref 150.0–400.0)
RBC: 4.05 Mil/uL (ref 3.87–5.11)
RDW: 13.9 % (ref 11.5–15.5)
WBC: 6.4 K/uL (ref 4.0–10.5)

## 2024-03-07 LAB — B12 AND FOLATE PANEL
Folate: >23.4 ng/mL (ref >5.9)
Vitamin B-12: 1092 pg/mL — ABNORMAL HIGH (ref 211–911)

## 2024-03-08 ENCOUNTER — Ambulatory Visit: Payer: Self-pay | Admitting: Internal Medicine

## 2024-03-08 ENCOUNTER — Telehealth: Payer: Self-pay

## 2024-03-08 DIAGNOSIS — E782 Mixed hyperlipidemia: Secondary | ICD-10-CM | POA: Diagnosis not present

## 2024-03-08 DIAGNOSIS — I2510 Atherosclerotic heart disease of native coronary artery without angina pectoris: Secondary | ICD-10-CM | POA: Diagnosis not present

## 2024-03-08 DIAGNOSIS — N1831 Chronic kidney disease, stage 3a: Secondary | ICD-10-CM | POA: Diagnosis not present

## 2024-03-08 DIAGNOSIS — K581 Irritable bowel syndrome with constipation: Secondary | ICD-10-CM | POA: Diagnosis not present

## 2024-03-08 DIAGNOSIS — J449 Chronic obstructive pulmonary disease, unspecified: Secondary | ICD-10-CM | POA: Diagnosis not present

## 2024-03-08 DIAGNOSIS — L299 Pruritus, unspecified: Secondary | ICD-10-CM | POA: Diagnosis not present

## 2024-03-08 DIAGNOSIS — E876 Hypokalemia: Secondary | ICD-10-CM | POA: Diagnosis not present

## 2024-03-08 DIAGNOSIS — C3490 Malignant neoplasm of unspecified part of unspecified bronchus or lung: Secondary | ICD-10-CM | POA: Diagnosis not present

## 2024-03-08 DIAGNOSIS — G3184 Mild cognitive impairment, so stated: Secondary | ICD-10-CM | POA: Diagnosis not present

## 2024-03-08 DIAGNOSIS — I129 Hypertensive chronic kidney disease with stage 1 through stage 4 chronic kidney disease, or unspecified chronic kidney disease: Secondary | ICD-10-CM | POA: Diagnosis not present

## 2024-03-08 DIAGNOSIS — G4700 Insomnia, unspecified: Secondary | ICD-10-CM | POA: Diagnosis not present

## 2024-03-08 DIAGNOSIS — M80052D Age-related osteoporosis with current pathological fracture, left femur, subsequent encounter for fracture with routine healing: Secondary | ICD-10-CM | POA: Diagnosis not present

## 2024-03-08 NOTE — Telephone Encounter (Signed)
 Plan of care signed and faxed back to [facility] at [phone]. Form sent for scanning.

## 2024-03-09 DIAGNOSIS — J449 Chronic obstructive pulmonary disease, unspecified: Secondary | ICD-10-CM | POA: Diagnosis not present

## 2024-03-09 DIAGNOSIS — M80052D Age-related osteoporosis with current pathological fracture, left femur, subsequent encounter for fracture with routine healing: Secondary | ICD-10-CM | POA: Diagnosis not present

## 2024-03-09 DIAGNOSIS — I129 Hypertensive chronic kidney disease with stage 1 through stage 4 chronic kidney disease, or unspecified chronic kidney disease: Secondary | ICD-10-CM | POA: Diagnosis not present

## 2024-03-09 DIAGNOSIS — E782 Mixed hyperlipidemia: Secondary | ICD-10-CM | POA: Diagnosis not present

## 2024-03-09 DIAGNOSIS — C3490 Malignant neoplasm of unspecified part of unspecified bronchus or lung: Secondary | ICD-10-CM | POA: Diagnosis not present

## 2024-03-09 DIAGNOSIS — N1831 Chronic kidney disease, stage 3a: Secondary | ICD-10-CM | POA: Diagnosis not present

## 2024-03-10 DIAGNOSIS — E782 Mixed hyperlipidemia: Secondary | ICD-10-CM | POA: Diagnosis not present

## 2024-03-10 DIAGNOSIS — N1831 Chronic kidney disease, stage 3a: Secondary | ICD-10-CM | POA: Diagnosis not present

## 2024-03-10 DIAGNOSIS — C3490 Malignant neoplasm of unspecified part of unspecified bronchus or lung: Secondary | ICD-10-CM | POA: Diagnosis not present

## 2024-03-10 DIAGNOSIS — J449 Chronic obstructive pulmonary disease, unspecified: Secondary | ICD-10-CM | POA: Diagnosis not present

## 2024-03-10 DIAGNOSIS — M80052D Age-related osteoporosis with current pathological fracture, left femur, subsequent encounter for fracture with routine healing: Secondary | ICD-10-CM | POA: Diagnosis not present

## 2024-03-10 DIAGNOSIS — I129 Hypertensive chronic kidney disease with stage 1 through stage 4 chronic kidney disease, or unspecified chronic kidney disease: Secondary | ICD-10-CM | POA: Diagnosis not present

## 2024-03-11 DIAGNOSIS — I129 Hypertensive chronic kidney disease with stage 1 through stage 4 chronic kidney disease, or unspecified chronic kidney disease: Secondary | ICD-10-CM | POA: Diagnosis not present

## 2024-03-11 DIAGNOSIS — E782 Mixed hyperlipidemia: Secondary | ICD-10-CM | POA: Diagnosis not present

## 2024-03-11 DIAGNOSIS — J449 Chronic obstructive pulmonary disease, unspecified: Secondary | ICD-10-CM | POA: Diagnosis not present

## 2024-03-11 DIAGNOSIS — M80052D Age-related osteoporosis with current pathological fracture, left femur, subsequent encounter for fracture with routine healing: Secondary | ICD-10-CM | POA: Diagnosis not present

## 2024-03-11 DIAGNOSIS — C3490 Malignant neoplasm of unspecified part of unspecified bronchus or lung: Secondary | ICD-10-CM | POA: Diagnosis not present

## 2024-03-11 DIAGNOSIS — N1831 Chronic kidney disease, stage 3a: Secondary | ICD-10-CM | POA: Diagnosis not present

## 2024-03-14 DIAGNOSIS — M80052D Age-related osteoporosis with current pathological fracture, left femur, subsequent encounter for fracture with routine healing: Secondary | ICD-10-CM | POA: Diagnosis not present

## 2024-03-14 DIAGNOSIS — E782 Mixed hyperlipidemia: Secondary | ICD-10-CM | POA: Diagnosis not present

## 2024-03-14 DIAGNOSIS — I129 Hypertensive chronic kidney disease with stage 1 through stage 4 chronic kidney disease, or unspecified chronic kidney disease: Secondary | ICD-10-CM | POA: Diagnosis not present

## 2024-03-14 DIAGNOSIS — C3490 Malignant neoplasm of unspecified part of unspecified bronchus or lung: Secondary | ICD-10-CM | POA: Diagnosis not present

## 2024-03-14 DIAGNOSIS — J449 Chronic obstructive pulmonary disease, unspecified: Secondary | ICD-10-CM | POA: Diagnosis not present

## 2024-03-14 DIAGNOSIS — N1831 Chronic kidney disease, stage 3a: Secondary | ICD-10-CM | POA: Diagnosis not present

## 2024-03-15 DIAGNOSIS — J449 Chronic obstructive pulmonary disease, unspecified: Secondary | ICD-10-CM | POA: Diagnosis not present

## 2024-03-15 DIAGNOSIS — I129 Hypertensive chronic kidney disease with stage 1 through stage 4 chronic kidney disease, or unspecified chronic kidney disease: Secondary | ICD-10-CM | POA: Diagnosis not present

## 2024-03-15 DIAGNOSIS — M80052D Age-related osteoporosis with current pathological fracture, left femur, subsequent encounter for fracture with routine healing: Secondary | ICD-10-CM | POA: Diagnosis not present

## 2024-03-15 DIAGNOSIS — N1831 Chronic kidney disease, stage 3a: Secondary | ICD-10-CM | POA: Diagnosis not present

## 2024-03-15 DIAGNOSIS — E782 Mixed hyperlipidemia: Secondary | ICD-10-CM | POA: Diagnosis not present

## 2024-03-15 DIAGNOSIS — C3490 Malignant neoplasm of unspecified part of unspecified bronchus or lung: Secondary | ICD-10-CM | POA: Diagnosis not present

## 2024-03-16 DIAGNOSIS — N1831 Chronic kidney disease, stage 3a: Secondary | ICD-10-CM | POA: Diagnosis not present

## 2024-03-16 DIAGNOSIS — I129 Hypertensive chronic kidney disease with stage 1 through stage 4 chronic kidney disease, or unspecified chronic kidney disease: Secondary | ICD-10-CM | POA: Diagnosis not present

## 2024-03-16 DIAGNOSIS — J449 Chronic obstructive pulmonary disease, unspecified: Secondary | ICD-10-CM | POA: Diagnosis not present

## 2024-03-16 DIAGNOSIS — C3490 Malignant neoplasm of unspecified part of unspecified bronchus or lung: Secondary | ICD-10-CM | POA: Diagnosis not present

## 2024-03-16 DIAGNOSIS — M80052D Age-related osteoporosis with current pathological fracture, left femur, subsequent encounter for fracture with routine healing: Secondary | ICD-10-CM | POA: Diagnosis not present

## 2024-03-16 DIAGNOSIS — E782 Mixed hyperlipidemia: Secondary | ICD-10-CM | POA: Diagnosis not present

## 2024-03-17 DIAGNOSIS — C3490 Malignant neoplasm of unspecified part of unspecified bronchus or lung: Secondary | ICD-10-CM | POA: Diagnosis not present

## 2024-03-17 DIAGNOSIS — M80052D Age-related osteoporosis with current pathological fracture, left femur, subsequent encounter for fracture with routine healing: Secondary | ICD-10-CM | POA: Diagnosis not present

## 2024-03-17 DIAGNOSIS — N1831 Chronic kidney disease, stage 3a: Secondary | ICD-10-CM | POA: Diagnosis not present

## 2024-03-17 DIAGNOSIS — E782 Mixed hyperlipidemia: Secondary | ICD-10-CM | POA: Diagnosis not present

## 2024-03-17 DIAGNOSIS — I129 Hypertensive chronic kidney disease with stage 1 through stage 4 chronic kidney disease, or unspecified chronic kidney disease: Secondary | ICD-10-CM | POA: Diagnosis not present

## 2024-03-17 DIAGNOSIS — J449 Chronic obstructive pulmonary disease, unspecified: Secondary | ICD-10-CM | POA: Diagnosis not present

## 2024-03-20 ENCOUNTER — Other Ambulatory Visit: Payer: Self-pay | Admitting: Internal Medicine

## 2024-03-22 ENCOUNTER — Other Ambulatory Visit (HOSPITAL_BASED_OUTPATIENT_CLINIC_OR_DEPARTMENT_OTHER): Payer: Self-pay | Admitting: Internal Medicine

## 2024-03-22 ENCOUNTER — Other Ambulatory Visit (HOSPITAL_BASED_OUTPATIENT_CLINIC_OR_DEPARTMENT_OTHER): Payer: Self-pay

## 2024-03-22 ENCOUNTER — Telehealth: Payer: Self-pay

## 2024-03-22 DIAGNOSIS — C3490 Malignant neoplasm of unspecified part of unspecified bronchus or lung: Secondary | ICD-10-CM | POA: Diagnosis not present

## 2024-03-22 DIAGNOSIS — E782 Mixed hyperlipidemia: Secondary | ICD-10-CM | POA: Diagnosis not present

## 2024-03-22 DIAGNOSIS — J449 Chronic obstructive pulmonary disease, unspecified: Secondary | ICD-10-CM | POA: Diagnosis not present

## 2024-03-22 DIAGNOSIS — E785 Hyperlipidemia, unspecified: Secondary | ICD-10-CM

## 2024-03-22 DIAGNOSIS — N1831 Chronic kidney disease, stage 3a: Secondary | ICD-10-CM | POA: Diagnosis not present

## 2024-03-22 DIAGNOSIS — M80052D Age-related osteoporosis with current pathological fracture, left femur, subsequent encounter for fracture with routine healing: Secondary | ICD-10-CM | POA: Diagnosis not present

## 2024-03-22 DIAGNOSIS — I129 Hypertensive chronic kidney disease with stage 1 through stage 4 chronic kidney disease, or unspecified chronic kidney disease: Secondary | ICD-10-CM | POA: Diagnosis not present

## 2024-03-22 MED ORDER — REPATHA SURECLICK 140 MG/ML ~~LOC~~ SOAJ
140.0000 mg | SUBCUTANEOUS | 3 refills | Status: DC
  Filled 2024-03-22: qty 2, 28d supply, fill #0

## 2024-03-22 MED ORDER — ROSUVASTATIN CALCIUM 40 MG PO TABS
40.0000 mg | ORAL_TABLET | Freq: Every day | ORAL | 0 refills | Status: DC
  Filled 2024-03-22: qty 90, 90d supply, fill #0

## 2024-03-22 NOTE — Telephone Encounter (Signed)
*  STAT* If patient is at the pharmacy, call can be transferred to refill team.   1. Which medications need to be refilled? (please list name of each medication and dose if known) Evolocumab  (REPATHA  SURECLICK) 140 MG/ML SOAJ , raloxifene  (EVISTA ) 60 MG tablet , rosuvastatin  (CRESTOR ) 40 MG tablet    2. Would you like to learn more about the convenience, safety, & potential cost savings by using the [facility]? NO   3. Are you open to using the [facility] NO   4. Which pharmacy/location (including street and city if local pharmacy) is medication to be sent to?  [id number] - [place], [place] - [address]     5. Do they need a 30 day or 90 day supply? 90

## 2024-03-22 NOTE — Telephone Encounter (Signed)
**Note De-identified  [place] Obfuscation** Please advise 

## 2024-03-22 NOTE — Telephone Encounter (Signed)
 RX for Rosuvastatin  sent in. RX for Raloxifene  must come from PCP. RX for Repatha  routed to PharmD

## 2024-03-22 NOTE — Telephone Encounter (Signed)
 Copied from CRM #[id number]. Topic: Clinical - Medical Advice >> Mar 22, 2024  3:44 PM Delon DASEN wrote: Reason for CRM: Reena calling with concerns about the patient's blood pressure readings, she feels they are low- [phone] please call after 3pm She only takes one carvedilol  (COREG ) per day. 02/23/24  124/73 03/02/24 110/60 7/11  115/62 7/15  134/78 7/16  117/62 7/17  120/64 7/18  120/61 7/21  106/60 7/23  116/61 7/24  110/60 7/29  102/70 Pulse running 84-99  lowest 64 Resp 18 and 20 O2 in high 90's >> Mar 22, 2024  3:56 PM [place] M wrote: Blood pressure meds taken before No earlier than 11:30 am and no later than 2:30

## 2024-03-23 DIAGNOSIS — M80052D Age-related osteoporosis with current pathological fracture, left femur, subsequent encounter for fracture with routine healing: Secondary | ICD-10-CM | POA: Diagnosis not present

## 2024-03-23 DIAGNOSIS — I129 Hypertensive chronic kidney disease with stage 1 through stage 4 chronic kidney disease, or unspecified chronic kidney disease: Secondary | ICD-10-CM | POA: Diagnosis not present

## 2024-03-23 DIAGNOSIS — J449 Chronic obstructive pulmonary disease, unspecified: Secondary | ICD-10-CM | POA: Diagnosis not present

## 2024-03-23 DIAGNOSIS — N1831 Chronic kidney disease, stage 3a: Secondary | ICD-10-CM | POA: Diagnosis not present

## 2024-03-23 DIAGNOSIS — C3490 Malignant neoplasm of unspecified part of unspecified bronchus or lung: Secondary | ICD-10-CM | POA: Diagnosis not present

## 2024-03-23 DIAGNOSIS — E782 Mixed hyperlipidemia: Secondary | ICD-10-CM | POA: Diagnosis not present

## 2024-03-23 NOTE — Telephone Encounter (Signed)
 Will call after 3pm as requested.

## 2024-03-23 NOTE — Telephone Encounter (Signed)
 Spoke w/ Reena, Pt's sister, denies dizziness and/or lightheadedness. She will continue monitoring BPs.

## 2024-03-24 ENCOUNTER — Other Ambulatory Visit: Payer: Self-pay | Admitting: Internal Medicine

## 2024-03-24 DIAGNOSIS — F418 Other specified anxiety disorders: Secondary | ICD-10-CM | POA: Diagnosis not present

## 2024-03-24 DIAGNOSIS — F424 Excoriation (skin-picking) disorder: Secondary | ICD-10-CM | POA: Diagnosis not present

## 2024-03-24 DIAGNOSIS — L299 Pruritus, unspecified: Secondary | ICD-10-CM | POA: Diagnosis not present

## 2024-03-24 DIAGNOSIS — E785 Hyperlipidemia, unspecified: Secondary | ICD-10-CM

## 2024-03-24 DIAGNOSIS — K581 Irritable bowel syndrome with constipation: Secondary | ICD-10-CM | POA: Diagnosis not present

## 2024-03-24 DIAGNOSIS — F411 Generalized anxiety disorder: Secondary | ICD-10-CM | POA: Diagnosis not present

## 2024-03-24 DIAGNOSIS — M80052D Age-related osteoporosis with current pathological fracture, left femur, subsequent encounter for fracture with routine healing: Secondary | ICD-10-CM | POA: Diagnosis not present

## 2024-03-24 DIAGNOSIS — I2510 Atherosclerotic heart disease of native coronary artery without angina pectoris: Secondary | ICD-10-CM | POA: Diagnosis not present

## 2024-03-24 DIAGNOSIS — D529 Folate deficiency anemia, unspecified: Secondary | ICD-10-CM | POA: Diagnosis not present

## 2024-03-24 DIAGNOSIS — M1990 Unspecified osteoarthritis, unspecified site: Secondary | ICD-10-CM | POA: Diagnosis not present

## 2024-03-24 DIAGNOSIS — C3490 Malignant neoplasm of unspecified part of unspecified bronchus or lung: Secondary | ICD-10-CM | POA: Diagnosis not present

## 2024-03-24 DIAGNOSIS — I129 Hypertensive chronic kidney disease with stage 1 through stage 4 chronic kidney disease, or unspecified chronic kidney disease: Secondary | ICD-10-CM | POA: Diagnosis not present

## 2024-03-24 DIAGNOSIS — J449 Chronic obstructive pulmonary disease, unspecified: Secondary | ICD-10-CM | POA: Diagnosis not present

## 2024-03-24 DIAGNOSIS — K5909 Other constipation: Secondary | ICD-10-CM | POA: Diagnosis not present

## 2024-03-24 DIAGNOSIS — E782 Mixed hyperlipidemia: Secondary | ICD-10-CM | POA: Diagnosis not present

## 2024-03-24 DIAGNOSIS — E860 Dehydration: Secondary | ICD-10-CM | POA: Diagnosis not present

## 2024-03-24 DIAGNOSIS — G3184 Mild cognitive impairment, so stated: Secondary | ICD-10-CM | POA: Diagnosis not present

## 2024-03-24 DIAGNOSIS — G4700 Insomnia, unspecified: Secondary | ICD-10-CM | POA: Diagnosis not present

## 2024-03-24 DIAGNOSIS — R451 Restlessness and agitation: Secondary | ICD-10-CM | POA: Diagnosis not present

## 2024-03-24 DIAGNOSIS — E519 Thiamine deficiency, unspecified: Secondary | ICD-10-CM | POA: Diagnosis not present

## 2024-03-24 DIAGNOSIS — N1831 Chronic kidney disease, stage 3a: Secondary | ICD-10-CM | POA: Diagnosis not present

## 2024-03-24 DIAGNOSIS — D519 Vitamin B12 deficiency anemia, unspecified: Secondary | ICD-10-CM | POA: Diagnosis not present

## 2024-03-24 DIAGNOSIS — F17200 Nicotine dependence, unspecified, uncomplicated: Secondary | ICD-10-CM | POA: Diagnosis not present

## 2024-03-24 DIAGNOSIS — E876 Hypokalemia: Secondary | ICD-10-CM | POA: Diagnosis not present

## 2024-03-24 DIAGNOSIS — F1090 Alcohol use, unspecified, uncomplicated: Secondary | ICD-10-CM | POA: Diagnosis not present

## 2024-03-24 DIAGNOSIS — Z85828 Personal history of other malignant neoplasm of skin: Secondary | ICD-10-CM | POA: Diagnosis not present

## 2024-03-24 NOTE — Telephone Encounter (Signed)
*  STAT* If patient is at the pharmacy, call can be transferred to refill team.   1. Which medications need to be refilled? (please list name of each medication and dose if known) new prescription for Evista    2. Would you like to learn more about the convenience, safety, & potential cost savings by using the [facility]?     3. Are you open to using the [facility] (Type [facility].    4. Which pharmacy/location (including street and city if local pharmacy) is medication to be sent to?[facility] [address], [place]   5. Do they need a 30 day or 90 day supply? 90 days and refills- please call today-out of medicine- she has appointment on 04-27-24

## 2024-03-29 DIAGNOSIS — C3490 Malignant neoplasm of unspecified part of unspecified bronchus or lung: Secondary | ICD-10-CM | POA: Diagnosis not present

## 2024-03-29 DIAGNOSIS — I129 Hypertensive chronic kidney disease with stage 1 through stage 4 chronic kidney disease, or unspecified chronic kidney disease: Secondary | ICD-10-CM | POA: Diagnosis not present

## 2024-03-29 DIAGNOSIS — E782 Mixed hyperlipidemia: Secondary | ICD-10-CM | POA: Diagnosis not present

## 2024-03-29 DIAGNOSIS — N1831 Chronic kidney disease, stage 3a: Secondary | ICD-10-CM | POA: Diagnosis not present

## 2024-03-29 DIAGNOSIS — M80052D Age-related osteoporosis with current pathological fracture, left femur, subsequent encounter for fracture with routine healing: Secondary | ICD-10-CM | POA: Diagnosis not present

## 2024-03-29 DIAGNOSIS — J449 Chronic obstructive pulmonary disease, unspecified: Secondary | ICD-10-CM | POA: Diagnosis not present

## 2024-04-01 DIAGNOSIS — J449 Chronic obstructive pulmonary disease, unspecified: Secondary | ICD-10-CM | POA: Diagnosis not present

## 2024-04-01 DIAGNOSIS — N1831 Chronic kidney disease, stage 3a: Secondary | ICD-10-CM | POA: Diagnosis not present

## 2024-04-01 DIAGNOSIS — C3490 Malignant neoplasm of unspecified part of unspecified bronchus or lung: Secondary | ICD-10-CM | POA: Diagnosis not present

## 2024-04-01 DIAGNOSIS — E782 Mixed hyperlipidemia: Secondary | ICD-10-CM | POA: Diagnosis not present

## 2024-04-01 DIAGNOSIS — I129 Hypertensive chronic kidney disease with stage 1 through stage 4 chronic kidney disease, or unspecified chronic kidney disease: Secondary | ICD-10-CM | POA: Diagnosis not present

## 2024-04-01 DIAGNOSIS — M80052D Age-related osteoporosis with current pathological fracture, left femur, subsequent encounter for fracture with routine healing: Secondary | ICD-10-CM | POA: Diagnosis not present

## 2024-04-06 DIAGNOSIS — M80052D Age-related osteoporosis with current pathological fracture, left femur, subsequent encounter for fracture with routine healing: Secondary | ICD-10-CM | POA: Diagnosis not present

## 2024-04-06 DIAGNOSIS — J449 Chronic obstructive pulmonary disease, unspecified: Secondary | ICD-10-CM | POA: Diagnosis not present

## 2024-04-06 DIAGNOSIS — I129 Hypertensive chronic kidney disease with stage 1 through stage 4 chronic kidney disease, or unspecified chronic kidney disease: Secondary | ICD-10-CM | POA: Diagnosis not present

## 2024-04-06 DIAGNOSIS — C3490 Malignant neoplasm of unspecified part of unspecified bronchus or lung: Secondary | ICD-10-CM | POA: Diagnosis not present

## 2024-04-06 DIAGNOSIS — N1831 Chronic kidney disease, stage 3a: Secondary | ICD-10-CM | POA: Diagnosis not present

## 2024-04-06 DIAGNOSIS — E782 Mixed hyperlipidemia: Secondary | ICD-10-CM | POA: Diagnosis not present

## 2024-04-08 DIAGNOSIS — J449 Chronic obstructive pulmonary disease, unspecified: Secondary | ICD-10-CM | POA: Diagnosis not present

## 2024-04-08 DIAGNOSIS — I129 Hypertensive chronic kidney disease with stage 1 through stage 4 chronic kidney disease, or unspecified chronic kidney disease: Secondary | ICD-10-CM | POA: Diagnosis not present

## 2024-04-08 DIAGNOSIS — C3490 Malignant neoplasm of unspecified part of unspecified bronchus or lung: Secondary | ICD-10-CM | POA: Diagnosis not present

## 2024-04-08 DIAGNOSIS — N1831 Chronic kidney disease, stage 3a: Secondary | ICD-10-CM | POA: Diagnosis not present

## 2024-04-08 DIAGNOSIS — E782 Mixed hyperlipidemia: Secondary | ICD-10-CM | POA: Diagnosis not present

## 2024-04-08 DIAGNOSIS — M80052D Age-related osteoporosis with current pathological fracture, left femur, subsequent encounter for fracture with routine healing: Secondary | ICD-10-CM | POA: Diagnosis not present

## 2024-04-11 ENCOUNTER — Telehealth: Payer: Self-pay | Admitting: Internal Medicine

## 2024-04-11 DIAGNOSIS — C3490 Malignant neoplasm of unspecified part of unspecified bronchus or lung: Secondary | ICD-10-CM | POA: Diagnosis not present

## 2024-04-11 DIAGNOSIS — I129 Hypertensive chronic kidney disease with stage 1 through stage 4 chronic kidney disease, or unspecified chronic kidney disease: Secondary | ICD-10-CM | POA: Diagnosis not present

## 2024-04-11 DIAGNOSIS — E782 Mixed hyperlipidemia: Secondary | ICD-10-CM | POA: Diagnosis not present

## 2024-04-11 DIAGNOSIS — M80052D Age-related osteoporosis with current pathological fracture, left femur, subsequent encounter for fracture with routine healing: Secondary | ICD-10-CM | POA: Diagnosis not present

## 2024-04-11 DIAGNOSIS — N1831 Chronic kidney disease, stage 3a: Secondary | ICD-10-CM | POA: Diagnosis not present

## 2024-04-11 DIAGNOSIS — J449 Chronic obstructive pulmonary disease, unspecified: Secondary | ICD-10-CM | POA: Diagnosis not present

## 2024-04-11 NOTE — Telephone Encounter (Unsigned)
 Copied from CRM #[id number]. Topic: Clinical - Medication Question >> Apr 11, 2024  2:45 PM Mia F wrote: Reason for CRM: PT caretaker says she was told that the office will lower pt carvedilol  (COREG ) 12.5 MG tablet but it looks like it is now 3 times the amount she has previously taken when it should be less than the original amount. She is concerned because pt has been having low bp. Please advise

## 2024-04-11 NOTE — Telephone Encounter (Signed)
 The recommended dose is: Carvedilol  12.5 mg 1 tablet twice daily.  If the blood pressures are consistently less than 110/85 let me know.  Will decrease to carvedilol  6.25 mg BID.  If she having severe symptoms such as severe weakness, dizziness we need to know.

## 2024-04-11 NOTE — Telephone Encounter (Signed)
 E2c2 message sent to clarify who called, it is not clear if it was the Pt's caregiver (?) or sister. Awaiting answer back.

## 2024-04-12 NOTE — Telephone Encounter (Signed)
 Spoke w/ Reena, she states she wanted to call and verify- she picked up a prescription for carvedilol  12.5mg  tablets, she informed that Pt has been on carvedilol  6.25mg  bid all along, Reena states even w/ that dose Pt's BPs can sometimes be low. See readings during telephone call from 03/22/24- when I spoke w/ Reena at that time she informed that Pt was not having dizziness or lightheadedness. She still denies that she has those sx's but sometimes states she just seems comatose. She believes this is in regards to the carvedilol .

## 2024-04-12 NOTE — Telephone Encounter (Signed)
 Okay, if she has been on carvedilol  6.25 mg twice daily all along, send the prescription. I am not sure what means when the patient seems comatose.  If she is disoriented or not responding: ER

## 2024-04-13 NOTE — Telephone Encounter (Signed)
 Spoke w/ Reena- informed of PCP recommendations. Instructed if Pt has a lot of carvedilol  12.5mg  tablets- Reena can cut those in 1/2 bid to equal the correct dose. Reena verbalized understanding.

## 2024-04-14 DIAGNOSIS — S72042D Displaced fracture of base of neck of left femur, subsequent encounter for closed fracture with routine healing: Secondary | ICD-10-CM | POA: Diagnosis not present

## 2024-04-18 DIAGNOSIS — N1831 Chronic kidney disease, stage 3a: Secondary | ICD-10-CM | POA: Diagnosis not present

## 2024-04-18 DIAGNOSIS — Z7982 Long term (current) use of aspirin: Secondary | ICD-10-CM | POA: Diagnosis not present

## 2024-04-18 DIAGNOSIS — Z85828 Personal history of other malignant neoplasm of skin: Secondary | ICD-10-CM | POA: Diagnosis not present

## 2024-04-18 DIAGNOSIS — M80052D Age-related osteoporosis with current pathological fracture, left femur, subsequent encounter for fracture with routine healing: Secondary | ICD-10-CM | POA: Diagnosis not present

## 2024-04-18 DIAGNOSIS — G4700 Insomnia, unspecified: Secondary | ICD-10-CM | POA: Diagnosis not present

## 2024-04-18 DIAGNOSIS — K469 Unspecified abdominal hernia without obstruction or gangrene: Secondary | ICD-10-CM | POA: Diagnosis not present

## 2024-04-18 DIAGNOSIS — F418 Other specified anxiety disorders: Secondary | ICD-10-CM | POA: Diagnosis not present

## 2024-04-18 DIAGNOSIS — M1990 Unspecified osteoarthritis, unspecified site: Secondary | ICD-10-CM | POA: Diagnosis not present

## 2024-04-18 DIAGNOSIS — E785 Hyperlipidemia, unspecified: Secondary | ICD-10-CM | POA: Diagnosis not present

## 2024-04-18 DIAGNOSIS — L299 Pruritus, unspecified: Secondary | ICD-10-CM | POA: Diagnosis not present

## 2024-04-18 DIAGNOSIS — J449 Chronic obstructive pulmonary disease, unspecified: Secondary | ICD-10-CM | POA: Diagnosis not present

## 2024-04-18 DIAGNOSIS — I2510 Atherosclerotic heart disease of native coronary artery without angina pectoris: Secondary | ICD-10-CM | POA: Diagnosis not present

## 2024-04-18 DIAGNOSIS — G3184 Mild cognitive impairment, so stated: Secondary | ICD-10-CM | POA: Diagnosis not present

## 2024-04-18 DIAGNOSIS — E538 Deficiency of other specified B group vitamins: Secondary | ICD-10-CM | POA: Diagnosis not present

## 2024-04-18 DIAGNOSIS — I129 Hypertensive chronic kidney disease with stage 1 through stage 4 chronic kidney disease, or unspecified chronic kidney disease: Secondary | ICD-10-CM | POA: Diagnosis not present

## 2024-04-18 DIAGNOSIS — F411 Generalized anxiety disorder: Secondary | ICD-10-CM | POA: Diagnosis not present

## 2024-04-18 DIAGNOSIS — Z85118 Personal history of other malignant neoplasm of bronchus and lung: Secondary | ICD-10-CM | POA: Diagnosis not present

## 2024-04-18 DIAGNOSIS — F1090 Alcohol use, unspecified, uncomplicated: Secondary | ICD-10-CM | POA: Diagnosis not present

## 2024-04-18 DIAGNOSIS — Z556 Problems related to health literacy: Secondary | ICD-10-CM | POA: Diagnosis not present

## 2024-04-18 DIAGNOSIS — Z5982 Transportation insecurity: Secondary | ICD-10-CM | POA: Diagnosis not present

## 2024-04-21 DIAGNOSIS — N1831 Chronic kidney disease, stage 3a: Secondary | ICD-10-CM | POA: Diagnosis not present

## 2024-04-21 DIAGNOSIS — E538 Deficiency of other specified B group vitamins: Secondary | ICD-10-CM | POA: Diagnosis not present

## 2024-04-21 DIAGNOSIS — I129 Hypertensive chronic kidney disease with stage 1 through stage 4 chronic kidney disease, or unspecified chronic kidney disease: Secondary | ICD-10-CM | POA: Diagnosis not present

## 2024-04-21 DIAGNOSIS — L299 Pruritus, unspecified: Secondary | ICD-10-CM | POA: Diagnosis not present

## 2024-04-21 DIAGNOSIS — J449 Chronic obstructive pulmonary disease, unspecified: Secondary | ICD-10-CM | POA: Diagnosis not present

## 2024-04-21 DIAGNOSIS — M80052D Age-related osteoporosis with current pathological fracture, left femur, subsequent encounter for fracture with routine healing: Secondary | ICD-10-CM | POA: Diagnosis not present

## 2024-04-26 DIAGNOSIS — J449 Chronic obstructive pulmonary disease, unspecified: Secondary | ICD-10-CM | POA: Diagnosis not present

## 2024-04-26 DIAGNOSIS — L299 Pruritus, unspecified: Secondary | ICD-10-CM | POA: Diagnosis not present

## 2024-04-26 DIAGNOSIS — M80052D Age-related osteoporosis with current pathological fracture, left femur, subsequent encounter for fracture with routine healing: Secondary | ICD-10-CM | POA: Diagnosis not present

## 2024-04-26 DIAGNOSIS — E538 Deficiency of other specified B group vitamins: Secondary | ICD-10-CM | POA: Diagnosis not present

## 2024-04-26 DIAGNOSIS — N1831 Chronic kidney disease, stage 3a: Secondary | ICD-10-CM | POA: Diagnosis not present

## 2024-04-26 DIAGNOSIS — I129 Hypertensive chronic kidney disease with stage 1 through stage 4 chronic kidney disease, or unspecified chronic kidney disease: Secondary | ICD-10-CM | POA: Diagnosis not present

## 2024-04-27 ENCOUNTER — Encounter (HOSPITAL_BASED_OUTPATIENT_CLINIC_OR_DEPARTMENT_OTHER): Admitting: Nurse Practitioner

## 2024-04-27 DIAGNOSIS — S72042D Displaced fracture of base of neck of left femur, subsequent encounter for closed fracture with routine healing: Secondary | ICD-10-CM | POA: Diagnosis not present

## 2024-04-27 NOTE — Progress Notes (Deleted)
  Cardiology Office Note   Date:  04/27/2024  ID:  [name] [name], DOB 1951-06-19, MRN [id number] PCP: Amon Aloysius BRAVO, MD  [facility] Providers Cardiologist:  None { Click to update primary MD,subspecialty MD or APP then REFRESH:1}    PMH Dyslipidemia Statin intolerance Lung cancer Hypertension Coronary artery calcification on CT  Referred to advanced lipid disorder clinic and seen by Dr. Mona 11/2017.  Longtime history of dyslipidemia and statin intolerance.  She also has history of lung cancer status post radiation therapy.  At the time she was on rosuvastatin  40 mg and ezetimibe  10 mg daily with recent lipids, total cholesterol 334, triglycerides 156, HDL 73, and LDL 229.  High HDL was suspected to be nonfunctional.  Recent chest CT follow-up of lung malignancy June 2018 demonstrated dense coronary artery vascular calcifications.  Family history of stroke in her father and A-fib in her mother.  Her sister does not have any cardiovascular disease to her awareness.  She reports eating a very atherogenic diet including 1-2 eggs several days a week, shrimp, chicken livers.  History of problems with swallowing tablets.  LDL improved from 229-133 on Zetia  10 mg daily, rosuvastatin  40 mg daily, and Colestid  once daily.  She was not quite at goal of LDL less than 70.  She was started on Praluent  with reduction in LDL to 50 in 2021 and ezetimibe  was stopped.  She was changed to Repatha  due to insurance in 2022.  Last cardiology clinic visit was 01/07/2023.  Unfortunately she had suffered a series of falls.  She was tolerating Repatha  without any concerning side effects.  History of Present Illness TAHIRY [name] is a 73 y.o. female ***  ROS: ***  Studies Reviewed      ***  No results found for: LIPOA  Risk Assessment/Calculations {Does this patient have ATRIAL FIBRILLATION?:[id number]} No BP recorded.  {Refresh Note OR Click here to enter BP  :1}***        Physical Exam VS:  There were no vitals taken for this visit.   Wt Readings from Last 3 Encounters:  02/03/24 147 lb 14.9 oz (67.1 kg)  07/30/23 145 lb 8.1 oz (66 kg)  07/17/23 145 lb 8.1 oz (66 kg)    GEN: Well nourished, well developed in no acute distress NECK: No JVD; No carotid bruits CARDIAC: ***RRR, no murmurs, rubs, gallops RESPIRATORY:  Clear to auscultation without rales, wheezing or rhonchi  ABDOMEN: Soft, non-tender, non-distended EXTREMITIES:  No edema; No deformity   ASSESSMENT AND PLAN ***    {Are you ordering a CV Procedure (e.g. stress test, cath, DCCV, TEE, etc)?   Press F2        :[id number]}  Dispo: ***  Signed, Rosaline Bane, NP-C

## 2024-04-28 DIAGNOSIS — J449 Chronic obstructive pulmonary disease, unspecified: Secondary | ICD-10-CM | POA: Diagnosis not present

## 2024-04-28 DIAGNOSIS — L299 Pruritus, unspecified: Secondary | ICD-10-CM | POA: Diagnosis not present

## 2024-04-28 DIAGNOSIS — N1831 Chronic kidney disease, stage 3a: Secondary | ICD-10-CM | POA: Diagnosis not present

## 2024-04-28 DIAGNOSIS — I129 Hypertensive chronic kidney disease with stage 1 through stage 4 chronic kidney disease, or unspecified chronic kidney disease: Secondary | ICD-10-CM | POA: Diagnosis not present

## 2024-04-28 DIAGNOSIS — E538 Deficiency of other specified B group vitamins: Secondary | ICD-10-CM | POA: Diagnosis not present

## 2024-04-28 DIAGNOSIS — M80052D Age-related osteoporosis with current pathological fracture, left femur, subsequent encounter for fracture with routine healing: Secondary | ICD-10-CM | POA: Diagnosis not present

## 2024-05-02 DIAGNOSIS — L299 Pruritus, unspecified: Secondary | ICD-10-CM | POA: Diagnosis not present

## 2024-05-02 DIAGNOSIS — J449 Chronic obstructive pulmonary disease, unspecified: Secondary | ICD-10-CM | POA: Diagnosis not present

## 2024-05-02 DIAGNOSIS — I129 Hypertensive chronic kidney disease with stage 1 through stage 4 chronic kidney disease, or unspecified chronic kidney disease: Secondary | ICD-10-CM | POA: Diagnosis not present

## 2024-05-02 DIAGNOSIS — E538 Deficiency of other specified B group vitamins: Secondary | ICD-10-CM | POA: Diagnosis not present

## 2024-05-02 DIAGNOSIS — N1831 Chronic kidney disease, stage 3a: Secondary | ICD-10-CM | POA: Diagnosis not present

## 2024-05-02 DIAGNOSIS — M80052D Age-related osteoporosis with current pathological fracture, left femur, subsequent encounter for fracture with routine healing: Secondary | ICD-10-CM | POA: Diagnosis not present

## 2024-05-05 DIAGNOSIS — L299 Pruritus, unspecified: Secondary | ICD-10-CM | POA: Diagnosis not present

## 2024-05-05 DIAGNOSIS — E538 Deficiency of other specified B group vitamins: Secondary | ICD-10-CM | POA: Diagnosis not present

## 2024-05-05 DIAGNOSIS — J449 Chronic obstructive pulmonary disease, unspecified: Secondary | ICD-10-CM | POA: Diagnosis not present

## 2024-05-05 DIAGNOSIS — N1831 Chronic kidney disease, stage 3a: Secondary | ICD-10-CM | POA: Diagnosis not present

## 2024-05-05 DIAGNOSIS — M80052D Age-related osteoporosis with current pathological fracture, left femur, subsequent encounter for fracture with routine healing: Secondary | ICD-10-CM | POA: Diagnosis not present

## 2024-05-05 DIAGNOSIS — I129 Hypertensive chronic kidney disease with stage 1 through stage 4 chronic kidney disease, or unspecified chronic kidney disease: Secondary | ICD-10-CM | POA: Diagnosis not present

## 2024-05-10 DIAGNOSIS — M80052D Age-related osteoporosis with current pathological fracture, left femur, subsequent encounter for fracture with routine healing: Secondary | ICD-10-CM | POA: Diagnosis not present

## 2024-05-10 DIAGNOSIS — J449 Chronic obstructive pulmonary disease, unspecified: Secondary | ICD-10-CM | POA: Diagnosis not present

## 2024-05-10 DIAGNOSIS — N1831 Chronic kidney disease, stage 3a: Secondary | ICD-10-CM | POA: Diagnosis not present

## 2024-05-10 DIAGNOSIS — E538 Deficiency of other specified B group vitamins: Secondary | ICD-10-CM | POA: Diagnosis not present

## 2024-05-10 DIAGNOSIS — L299 Pruritus, unspecified: Secondary | ICD-10-CM | POA: Diagnosis not present

## 2024-05-10 DIAGNOSIS — I129 Hypertensive chronic kidney disease with stage 1 through stage 4 chronic kidney disease, or unspecified chronic kidney disease: Secondary | ICD-10-CM | POA: Diagnosis not present

## 2024-05-12 DIAGNOSIS — M80052D Age-related osteoporosis with current pathological fracture, left femur, subsequent encounter for fracture with routine healing: Secondary | ICD-10-CM | POA: Diagnosis not present

## 2024-05-12 DIAGNOSIS — L299 Pruritus, unspecified: Secondary | ICD-10-CM | POA: Diagnosis not present

## 2024-05-12 DIAGNOSIS — J449 Chronic obstructive pulmonary disease, unspecified: Secondary | ICD-10-CM | POA: Diagnosis not present

## 2024-05-12 DIAGNOSIS — N1831 Chronic kidney disease, stage 3a: Secondary | ICD-10-CM | POA: Diagnosis not present

## 2024-05-12 DIAGNOSIS — E538 Deficiency of other specified B group vitamins: Secondary | ICD-10-CM | POA: Diagnosis not present

## 2024-05-12 DIAGNOSIS — I129 Hypertensive chronic kidney disease with stage 1 through stage 4 chronic kidney disease, or unspecified chronic kidney disease: Secondary | ICD-10-CM | POA: Diagnosis not present

## 2024-05-17 DIAGNOSIS — M80052D Age-related osteoporosis with current pathological fracture, left femur, subsequent encounter for fracture with routine healing: Secondary | ICD-10-CM | POA: Diagnosis not present

## 2024-05-17 DIAGNOSIS — L299 Pruritus, unspecified: Secondary | ICD-10-CM | POA: Diagnosis not present

## 2024-05-17 DIAGNOSIS — J449 Chronic obstructive pulmonary disease, unspecified: Secondary | ICD-10-CM | POA: Diagnosis not present

## 2024-05-17 DIAGNOSIS — E538 Deficiency of other specified B group vitamins: Secondary | ICD-10-CM | POA: Diagnosis not present

## 2024-05-17 DIAGNOSIS — N1831 Chronic kidney disease, stage 3a: Secondary | ICD-10-CM | POA: Diagnosis not present

## 2024-05-17 DIAGNOSIS — I129 Hypertensive chronic kidney disease with stage 1 through stage 4 chronic kidney disease, or unspecified chronic kidney disease: Secondary | ICD-10-CM | POA: Diagnosis not present

## 2024-05-18 DIAGNOSIS — Z5982 Transportation insecurity: Secondary | ICD-10-CM | POA: Diagnosis not present

## 2024-05-18 DIAGNOSIS — Z556 Problems related to health literacy: Secondary | ICD-10-CM | POA: Diagnosis not present

## 2024-05-18 DIAGNOSIS — J449 Chronic obstructive pulmonary disease, unspecified: Secondary | ICD-10-CM | POA: Diagnosis not present

## 2024-05-18 DIAGNOSIS — I2510 Atherosclerotic heart disease of native coronary artery without angina pectoris: Secondary | ICD-10-CM | POA: Diagnosis not present

## 2024-05-18 DIAGNOSIS — L299 Pruritus, unspecified: Secondary | ICD-10-CM | POA: Diagnosis not present

## 2024-05-18 DIAGNOSIS — Z85118 Personal history of other malignant neoplasm of bronchus and lung: Secondary | ICD-10-CM | POA: Diagnosis not present

## 2024-05-18 DIAGNOSIS — F418 Other specified anxiety disorders: Secondary | ICD-10-CM | POA: Diagnosis not present

## 2024-05-18 DIAGNOSIS — K469 Unspecified abdominal hernia without obstruction or gangrene: Secondary | ICD-10-CM | POA: Diagnosis not present

## 2024-05-18 DIAGNOSIS — M80052D Age-related osteoporosis with current pathological fracture, left femur, subsequent encounter for fracture with routine healing: Secondary | ICD-10-CM | POA: Diagnosis not present

## 2024-05-18 DIAGNOSIS — G4700 Insomnia, unspecified: Secondary | ICD-10-CM | POA: Diagnosis not present

## 2024-05-18 DIAGNOSIS — G3184 Mild cognitive impairment, so stated: Secondary | ICD-10-CM | POA: Diagnosis not present

## 2024-05-18 DIAGNOSIS — M1990 Unspecified osteoarthritis, unspecified site: Secondary | ICD-10-CM | POA: Diagnosis not present

## 2024-05-18 DIAGNOSIS — E538 Deficiency of other specified B group vitamins: Secondary | ICD-10-CM | POA: Diagnosis not present

## 2024-05-18 DIAGNOSIS — F411 Generalized anxiety disorder: Secondary | ICD-10-CM | POA: Diagnosis not present

## 2024-05-18 DIAGNOSIS — Z7982 Long term (current) use of aspirin: Secondary | ICD-10-CM | POA: Diagnosis not present

## 2024-05-18 DIAGNOSIS — Z85828 Personal history of other malignant neoplasm of skin: Secondary | ICD-10-CM | POA: Diagnosis not present

## 2024-05-18 DIAGNOSIS — I129 Hypertensive chronic kidney disease with stage 1 through stage 4 chronic kidney disease, or unspecified chronic kidney disease: Secondary | ICD-10-CM | POA: Diagnosis not present

## 2024-05-18 DIAGNOSIS — F1090 Alcohol use, unspecified, uncomplicated: Secondary | ICD-10-CM | POA: Diagnosis not present

## 2024-05-18 DIAGNOSIS — E785 Hyperlipidemia, unspecified: Secondary | ICD-10-CM | POA: Diagnosis not present

## 2024-05-18 DIAGNOSIS — N1831 Chronic kidney disease, stage 3a: Secondary | ICD-10-CM | POA: Diagnosis not present

## 2024-05-19 DIAGNOSIS — M80052D Age-related osteoporosis with current pathological fracture, left femur, subsequent encounter for fracture with routine healing: Secondary | ICD-10-CM | POA: Diagnosis not present

## 2024-05-19 DIAGNOSIS — N1831 Chronic kidney disease, stage 3a: Secondary | ICD-10-CM | POA: Diagnosis not present

## 2024-05-19 DIAGNOSIS — I129 Hypertensive chronic kidney disease with stage 1 through stage 4 chronic kidney disease, or unspecified chronic kidney disease: Secondary | ICD-10-CM | POA: Diagnosis not present

## 2024-05-19 DIAGNOSIS — E538 Deficiency of other specified B group vitamins: Secondary | ICD-10-CM | POA: Diagnosis not present

## 2024-05-19 DIAGNOSIS — L299 Pruritus, unspecified: Secondary | ICD-10-CM | POA: Diagnosis not present

## 2024-05-19 DIAGNOSIS — J449 Chronic obstructive pulmonary disease, unspecified: Secondary | ICD-10-CM | POA: Diagnosis not present

## 2024-05-24 DIAGNOSIS — M80052D Age-related osteoporosis with current pathological fracture, left femur, subsequent encounter for fracture with routine healing: Secondary | ICD-10-CM | POA: Diagnosis not present

## 2024-05-24 DIAGNOSIS — E538 Deficiency of other specified B group vitamins: Secondary | ICD-10-CM | POA: Diagnosis not present

## 2024-05-24 DIAGNOSIS — J449 Chronic obstructive pulmonary disease, unspecified: Secondary | ICD-10-CM | POA: Diagnosis not present

## 2024-05-24 DIAGNOSIS — I129 Hypertensive chronic kidney disease with stage 1 through stage 4 chronic kidney disease, or unspecified chronic kidney disease: Secondary | ICD-10-CM | POA: Diagnosis not present

## 2024-05-24 DIAGNOSIS — L299 Pruritus, unspecified: Secondary | ICD-10-CM | POA: Diagnosis not present

## 2024-05-24 DIAGNOSIS — N1831 Chronic kidney disease, stage 3a: Secondary | ICD-10-CM | POA: Diagnosis not present

## 2024-05-26 DIAGNOSIS — J449 Chronic obstructive pulmonary disease, unspecified: Secondary | ICD-10-CM | POA: Diagnosis not present

## 2024-05-26 DIAGNOSIS — M80052D Age-related osteoporosis with current pathological fracture, left femur, subsequent encounter for fracture with routine healing: Secondary | ICD-10-CM | POA: Diagnosis not present

## 2024-05-26 DIAGNOSIS — I129 Hypertensive chronic kidney disease with stage 1 through stage 4 chronic kidney disease, or unspecified chronic kidney disease: Secondary | ICD-10-CM | POA: Diagnosis not present

## 2024-05-26 DIAGNOSIS — N1831 Chronic kidney disease, stage 3a: Secondary | ICD-10-CM | POA: Diagnosis not present

## 2024-05-26 DIAGNOSIS — L299 Pruritus, unspecified: Secondary | ICD-10-CM | POA: Diagnosis not present

## 2024-05-26 DIAGNOSIS — E538 Deficiency of other specified B group vitamins: Secondary | ICD-10-CM | POA: Diagnosis not present

## 2024-05-31 DIAGNOSIS — N1831 Chronic kidney disease, stage 3a: Secondary | ICD-10-CM | POA: Diagnosis not present

## 2024-05-31 DIAGNOSIS — E538 Deficiency of other specified B group vitamins: Secondary | ICD-10-CM | POA: Diagnosis not present

## 2024-05-31 DIAGNOSIS — J449 Chronic obstructive pulmonary disease, unspecified: Secondary | ICD-10-CM | POA: Diagnosis not present

## 2024-05-31 DIAGNOSIS — L299 Pruritus, unspecified: Secondary | ICD-10-CM | POA: Diagnosis not present

## 2024-05-31 DIAGNOSIS — I129 Hypertensive chronic kidney disease with stage 1 through stage 4 chronic kidney disease, or unspecified chronic kidney disease: Secondary | ICD-10-CM | POA: Diagnosis not present

## 2024-05-31 DIAGNOSIS — M80052D Age-related osteoporosis with current pathological fracture, left femur, subsequent encounter for fracture with routine healing: Secondary | ICD-10-CM | POA: Diagnosis not present

## 2024-06-02 DIAGNOSIS — S72002A Fracture of unspecified part of neck of left femur, initial encounter for closed fracture: Secondary | ICD-10-CM | POA: Diagnosis not present

## 2024-06-02 DIAGNOSIS — M25552 Pain in left hip: Secondary | ICD-10-CM | POA: Diagnosis not present

## 2024-06-03 DIAGNOSIS — J449 Chronic obstructive pulmonary disease, unspecified: Secondary | ICD-10-CM | POA: Diagnosis not present

## 2024-06-03 DIAGNOSIS — E538 Deficiency of other specified B group vitamins: Secondary | ICD-10-CM | POA: Diagnosis not present

## 2024-06-03 DIAGNOSIS — I129 Hypertensive chronic kidney disease with stage 1 through stage 4 chronic kidney disease, or unspecified chronic kidney disease: Secondary | ICD-10-CM | POA: Diagnosis not present

## 2024-06-03 DIAGNOSIS — M80052D Age-related osteoporosis with current pathological fracture, left femur, subsequent encounter for fracture with routine healing: Secondary | ICD-10-CM | POA: Diagnosis not present

## 2024-06-03 DIAGNOSIS — N1831 Chronic kidney disease, stage 3a: Secondary | ICD-10-CM | POA: Diagnosis not present

## 2024-06-03 DIAGNOSIS — L299 Pruritus, unspecified: Secondary | ICD-10-CM | POA: Diagnosis not present

## 2024-06-06 DIAGNOSIS — M80052D Age-related osteoporosis with current pathological fracture, left femur, subsequent encounter for fracture with routine healing: Secondary | ICD-10-CM | POA: Diagnosis not present

## 2024-06-06 DIAGNOSIS — J449 Chronic obstructive pulmonary disease, unspecified: Secondary | ICD-10-CM | POA: Diagnosis not present

## 2024-06-06 DIAGNOSIS — E538 Deficiency of other specified B group vitamins: Secondary | ICD-10-CM | POA: Diagnosis not present

## 2024-06-06 DIAGNOSIS — I129 Hypertensive chronic kidney disease with stage 1 through stage 4 chronic kidney disease, or unspecified chronic kidney disease: Secondary | ICD-10-CM | POA: Diagnosis not present

## 2024-06-06 DIAGNOSIS — L299 Pruritus, unspecified: Secondary | ICD-10-CM | POA: Diagnosis not present

## 2024-06-06 DIAGNOSIS — N1831 Chronic kidney disease, stage 3a: Secondary | ICD-10-CM | POA: Diagnosis not present

## 2024-06-07 DIAGNOSIS — I129 Hypertensive chronic kidney disease with stage 1 through stage 4 chronic kidney disease, or unspecified chronic kidney disease: Secondary | ICD-10-CM | POA: Diagnosis not present

## 2024-06-07 DIAGNOSIS — M80052D Age-related osteoporosis with current pathological fracture, left femur, subsequent encounter for fracture with routine healing: Secondary | ICD-10-CM | POA: Diagnosis not present

## 2024-06-07 DIAGNOSIS — E538 Deficiency of other specified B group vitamins: Secondary | ICD-10-CM | POA: Diagnosis not present

## 2024-06-07 DIAGNOSIS — N1831 Chronic kidney disease, stage 3a: Secondary | ICD-10-CM | POA: Diagnosis not present

## 2024-06-07 DIAGNOSIS — L299 Pruritus, unspecified: Secondary | ICD-10-CM | POA: Diagnosis not present

## 2024-06-07 DIAGNOSIS — J449 Chronic obstructive pulmonary disease, unspecified: Secondary | ICD-10-CM | POA: Diagnosis not present

## 2024-06-09 ENCOUNTER — Telehealth (HOSPITAL_BASED_OUTPATIENT_CLINIC_OR_DEPARTMENT_OTHER): Payer: Self-pay

## 2024-06-09 ENCOUNTER — Other Ambulatory Visit (HOSPITAL_BASED_OUTPATIENT_CLINIC_OR_DEPARTMENT_OTHER): Payer: Self-pay

## 2024-06-09 DIAGNOSIS — R931 Abnormal findings on diagnostic imaging of heart and coronary circulation: Secondary | ICD-10-CM

## 2024-06-09 DIAGNOSIS — E785 Hyperlipidemia, unspecified: Secondary | ICD-10-CM

## 2024-06-09 NOTE — Telephone Encounter (Signed)
 Spoke with pts power of attorney Gennie Simpers, her sister). She stated understanding about upcoming appointment with Rosaline on 06/15/24 at 1:30 pm. Pt will have labs (fasting lipid panel) drawn right before her visit on 06/15/24 at the [facility]. Pt will be accompanied by a caregiver the day of visit.

## 2024-06-14 NOTE — Progress Notes (Signed)
 Cardiology Office Note   Date:  06/15/2024  ID:  [name] [name], DOB 1951-06-05, MRN [id number] PCP: Amon Aloysius BRAVO, MD  [facility] Providers Cardiologist:  None { Click to update primary MD,subspecialty MD or APP then REFRESH:1}    PMH Dyslipidemia Statin intolerance Lung cncer Hypertension Coronary artery disease Aortic atherosclerosis  Referred to Dr. Mona for management of dyslipidemia and seen 12/21/2017.  History of lung cancer s/p radiation therapy, hypertension, and dyslipidemia.  Current therapy was rosuvastatin  40 mg and ezetimibe  10 mg daily.  Most recent lipid profile revealed total cholesterol 334, triglycerides 156, HDL 73, and LDL 229.  While she does have high LDL, Dr. Mona suspected it was likely nonfunctional.  On chest CT for lung malignancy June 2018 it demonstrated dense coronary artery vascular calcifications.  This could be due to radiation injury to the coronaries.  History of stroke in her father and atrial fibrillation in her mother.  She reported eating a very atherogenic diet including 1-2 eggs several days a week, shrimp, chicken livers, amongst other foods.  She reported recently having problems with diarrhea, seen by Dr. Meriel, GI who prescribed Colestid .  Dr. Mona switched her to Colestid  soluble packets.  She also reported difficulty swallowing tablets, and therefore did not start the medication.  Her Dutch score was 6 suggesting probable FH.  Lipid profile was repeated which revealed total cholesterol 243, triglycerides 121, HDL 86, and LDL-C 133.  At follow-up visit 03/2018 she had made significant dietary changes and was taking Colestid  once daily rather than twice daily in addition to rosuvastatin  40 mg and Zetia  10 mg daily.  LDL had improved but was not at goal of 70 or lower.  She was advised to start Praluent  with subsequent decrease in total cholesterol from 273-116, LDL improved to 50 and she was very pleased with the  medication  Last lipid clinic visit was 01/07/23 with Dr. Mona at which time she had been switched to Repatha  due to insurance.  Unfortunately she had to falls that caused her to end up in the ED.  She had also continued on rosuvastatin .  A sample was provided due to her recently having run out and not having preauthorization.  She was due to get lipids checked at that visit, but there is no record that they were completed.  History of Present Illness [name] [name] is a 73 y.o. female ***  ROS: ***  Studies Reviewed      ***  No results found for: LIPOA  Risk Assessment/Calculations           Physical Exam VS:  BP 112/70 (BP Location: Right Arm, Patient Position: Sitting, Cuff Size: Normal)   Pulse 94   Ht 5' 3.5 (1.613 m)   SpO2 99%   BMI 25.79 kg/m    Wt Readings from Last 3 Encounters:  02/03/24 147 lb 14.9 oz (67.1 kg)  07/30/23 145 lb 8.1 oz (66 kg)  07/17/23 145 lb 8.1 oz (66 kg)    GEN: Well nourished, well developed in no acute distress NECK: No JVD; No carotid bruits CARDIAC: ***RRR, no murmurs, rubs, gallops RESPIRATORY:  Clear to auscultation without rales, wheezing or rhonchi  ABDOMEN: Soft, non-tender, non-distended EXTREMITIES:  No edema; No deformity   ASSESSMENT AND PLAN ***    {Are you ordering a CV Procedure (e.g. stress test, cath, DCCV, TEE, etc)?   Press F2        :[id number]}  Dispo: ***  Signed, Rosaline  Daniel Ritthaler, NP-C

## 2024-06-15 ENCOUNTER — Telehealth (HOSPITAL_COMMUNITY): Payer: Self-pay | Admitting: Pharmacy Technician

## 2024-06-15 ENCOUNTER — Encounter (HOSPITAL_BASED_OUTPATIENT_CLINIC_OR_DEPARTMENT_OTHER): Payer: Self-pay | Admitting: Nurse Practitioner

## 2024-06-15 ENCOUNTER — Ambulatory Visit (INDEPENDENT_AMBULATORY_CARE_PROVIDER_SITE_OTHER): Admitting: Nurse Practitioner

## 2024-06-15 ENCOUNTER — Other Ambulatory Visit (HOSPITAL_COMMUNITY): Payer: Self-pay

## 2024-06-15 VITALS — BP 112/70 | HR 94 | Ht 63.5 in

## 2024-06-15 DIAGNOSIS — I700 Atherosclerosis of aorta: Secondary | ICD-10-CM | POA: Diagnosis not present

## 2024-06-15 DIAGNOSIS — Z79899 Other long term (current) drug therapy: Secondary | ICD-10-CM

## 2024-06-15 DIAGNOSIS — I2510 Atherosclerotic heart disease of native coronary artery without angina pectoris: Secondary | ICD-10-CM

## 2024-06-15 DIAGNOSIS — E7849 Other hyperlipidemia: Secondary | ICD-10-CM

## 2024-06-15 DIAGNOSIS — R600 Localized edema: Secondary | ICD-10-CM | POA: Diagnosis not present

## 2024-06-15 DIAGNOSIS — R931 Abnormal findings on diagnostic imaging of heart and coronary circulation: Secondary | ICD-10-CM | POA: Diagnosis not present

## 2024-06-15 DIAGNOSIS — G8921 Chronic pain due to trauma: Secondary | ICD-10-CM

## 2024-06-15 DIAGNOSIS — E785 Hyperlipidemia, unspecified: Secondary | ICD-10-CM | POA: Diagnosis not present

## 2024-06-15 MED ORDER — CARVEDILOL 3.125 MG PO TABS: 3.1250 mg | ORAL_TABLET | Freq: Two times a day (BID) | ORAL | 3 refills | Status: AC

## 2024-06-15 NOTE — Patient Instructions (Signed)
 Medication Instructions:   CHANGE Coreg  one (1) tablet by mouth ( 3.125 mg) twice daily.   *If you need a refill on your cardiac medications before your next appointment, please call your pharmacy*  Lab Work:  None ordered.  If you have labs (blood work) drawn today and your tests are completely normal, you will receive your results only by: MyChart Message (if you have MyChart) OR A paper copy in the mail If you have any lab test that is abnormal or we need to change your treatment, we will call you to review the results.  Testing/Procedures:  None ordered.  Follow-Up: At [facility], you and your health needs are our priority.  As part of our continuing mission to provide you with exceptional heart care, our providers are all part of one team.  This team includes your primary Cardiologist (physician) and Advanced Practice Providers or APPs (Physician Assistants and Nurse Practitioners) who all work together to provide you with the care you need, when you need it.  Your next appointment:   To be determined based upon lab results.   Provider:   Rosaline Bane, NP    We recommend signing up for the patient portal called MyChart.  Sign up information is provided on this After Visit Summary.  MyChart is used to connect with patients for Virtual Visits (Telemedicine).  Patients are able to view lab/test results, encounter notes, upcoming appointments, etc.  Non-urgent messages can be sent to your provider as well.   To learn more about what you can do with MyChart, go to [email or web address].   Will be in touch about the price of Repatha .

## 2024-06-15 NOTE — Telephone Encounter (Signed)
-----   Message from [facility] Edsel MATSU sent at 06/15/2024  3:28 PM EDT -----  ----- Message ----- From: Eveline Shelba BRAVO, CPhT Sent: 06/15/2024   2:52 PM EDT To: Edsel JONELLE Sanders  I called the ins b/c when I do a test claim it says the med was filled 05/26/24. They gave me an approximate copay of $107. That was after a PA had been approved in October. Another PA won't make it any less expensive. ----- Message ----- From: Sanders Edsel JONELLE Sent: 06/15/2024   2:14 PM EDT To: Rx Prior Auth Team  Please PA Repatha . Pt stated cannot afford.   Thanks danielle

## 2024-06-15 NOTE — Telephone Encounter (Signed)
 Pharmacy Patient Advocate Encounter  Received notification from Physician's Office that prior authorization for Repatha  is required/requested.   Insurance verification completed.   The patient is insured through [employer].   Per test claim: Too soon to fill- last filled 05/26/24, but I don't see that we ever filled it at our pharmacy. Will call to f/u on where it was filled and if the pt received the medication.  Called the ins. PA was approved Octobert 1st- Med filled at [facility] 10/2. Aprox copay was $107. A second PA will not make it less expensive.

## 2024-06-15 NOTE — Telephone Encounter (Signed)
 Please call sister, who is health care POA (info in demographics) and advise that cost of Repatha  is $107. Cost may be lower in the future if she meets a deductible, but not certain that it will be any less. Is this reasonable for the patient to continue?

## 2024-06-15 NOTE — Telephone Encounter (Signed)
-----   Message from [facility] Danielle G sent at 06/15/2024  2:14 PM EDT ----- Please PA Repatha . Pt stated cannot afford.   Thanks danielle

## 2024-06-16 ENCOUNTER — Encounter (HOSPITAL_BASED_OUTPATIENT_CLINIC_OR_DEPARTMENT_OTHER): Payer: Self-pay | Admitting: *Deleted

## 2024-06-16 ENCOUNTER — Ambulatory Visit (HOSPITAL_BASED_OUTPATIENT_CLINIC_OR_DEPARTMENT_OTHER): Payer: Self-pay | Admitting: Family

## 2024-06-16 LAB — LIPID PANEL
Chol/HDL Ratio: 1.5 ratio (ref 0.0–4.4)
Cholesterol, Total: 135 mg/dL (ref 100–199)
HDL: 88 mg/dL (ref >39)
LDL Chol Calc (NIH): 32 mg/dL (ref 0–99)
Triglycerides: 80 mg/dL (ref 0–149)
VLDL Cholesterol Cal: 15 mg/dL (ref 5–40)

## 2024-06-16 NOTE — Telephone Encounter (Signed)
 S/w pt's sister per (DPR).  Stated repatha  is $107 monthly. Sister will see if this copay is feasible, will call back.  Stated was to early to make a decision.

## 2024-06-16 NOTE — Telephone Encounter (Signed)
 The patients Sister Reena Simpers (on [facility]) has been notified of the result and verbalized understanding.  All questions (if any) were answered.  Pts Sister is requesting a copy of these results to be mailed to the pts mailing address on file.  Will send out for mailing today.  Sister verbalized understanding and agrees with this plan.

## 2024-06-16 NOTE — Telephone Encounter (Signed)
-----   Message from Reche GORMAN Finder sent at 06/16/2024  7:48 AM EDT ----- Cholesterol goal.  Metta result! ----- Message ----- From: Rebecka Memos Lab Results In Sent: 06/16/2024   3:36 AM EDT To: Reche GORMAN Finder, NP

## 2024-06-17 ENCOUNTER — Telehealth (HOSPITAL_BASED_OUTPATIENT_CLINIC_OR_DEPARTMENT_OTHER): Payer: Self-pay | Admitting: *Deleted

## 2024-06-17 ENCOUNTER — Encounter (HOSPITAL_BASED_OUTPATIENT_CLINIC_OR_DEPARTMENT_OTHER): Payer: Self-pay | Admitting: Nurse Practitioner

## 2024-06-17 NOTE — Telephone Encounter (Signed)
 Left [name] [name] (pts sister/handles medical affairs/on DPR) a message to call the office back to inquire about Repatha  continuation, per Rosaline Bane, NP (see secure chat copied below referencing this).   I saw this pt on Wed and pt said she just used her last repatha  and it is too expensive. I saw where you called lipid result to sister yesterday. Edsel called the sister yesterday also and told her this which we got from the PA Team approximate copay of $107. That was after a PA had been approved in October. Another PA won't make it any less expensive. Sister said she would think about it and call us  back. Will you call her after 2 pm today to ask if they will continue on the repahta and let me know.

## 2024-06-21 ENCOUNTER — Telehealth: Payer: Self-pay | Admitting: Pharmacy Technician

## 2024-06-21 ENCOUNTER — Other Ambulatory Visit (HOSPITAL_COMMUNITY): Payer: Self-pay

## 2024-06-21 NOTE — Telephone Encounter (Signed)
 Will share the below message from our PA team with Reche Finder, NP, about pts health well grant on file.     Billy Salines D, CPhT to Me (Selected Message)     06/21/24  2:41 PM Patient has a healthwell grant already on file- active until  08/13/24    Resent information to patients pharmacy and asked them to notify patient once it is rebilled and ready for pick up   Will make Reche Finder, NP aware of PA teams response.

## 2024-06-21 NOTE — Telephone Encounter (Signed)
 Called sister per DPR. Reviewed that Bethene Ferretti in place and pharmacy aware. She was very appreciative of the call.  Kagen Kunath S Diantha Paxson, NP

## 2024-06-21 NOTE — Telephone Encounter (Signed)
 Patient Advocate Encounter   The patient was approved for a [name] that will help cover the cost of Repatha  Total amount awarded, $1600.76 is left on the grant.  Effective: 08/15/23 - 08/13/24   APW:[id number] ERW:[id number] Hmnle:[id number] PI:[id number]  [employer] ID: [id number]   Pharmacy provided with approval and processing information.

## 2024-06-21 NOTE — Telephone Encounter (Signed)
 Spoke with patient sister per DPR. Unable to afford $107 copay. Will route to prior auth team to start process for healthwell grant.   She very kindly requests that when [employer] approved to please get a call back between 3-4pm if at all possible.   Raelynn Corron S Rudra Hobbins, NP

## 2024-08-04 DIAGNOSIS — S72002P Fracture of unspecified part of neck of left femur, subsequent encounter for closed fracture with malunion: Secondary | ICD-10-CM | POA: Diagnosis not present

## 2024-08-16 ENCOUNTER — Telehealth: Payer: Self-pay | Admitting: Pharmacy Technician

## 2024-08-16 NOTE — Telephone Encounter (Signed)
 Patient Advocate Encounter   The patient was approved for a [name] that will help cover the cost of Repatha  Total amount awarded, 2500.00.  Effective: 08/14/24 - 08/13/25   APW:[id number] ERW:[id number] Hmnle:[id number] PI:[id number]   [employer] ID: [id number]   Pharmacy provided with approval and processing information.

## 2024-08-27 ENCOUNTER — Other Ambulatory Visit: Payer: Self-pay | Admitting: Internal Medicine

## 2024-08-30 DIAGNOSIS — E785 Hyperlipidemia, unspecified: Secondary | ICD-10-CM

## 2024-09-02 NOTE — Telephone Encounter (Signed)
 Pts sister states the pharmacy hasn't received the grant approval

## 2024-09-05 ENCOUNTER — Telehealth: Payer: Self-pay | Admitting: Pharmacy Technician

## 2024-09-05 NOTE — Telephone Encounter (Signed)
 I spoke to the pharmacy and now it is free. Spoke to sister and is now aware

## 2024-09-05 NOTE — Telephone Encounter (Signed)
 I called [facility] and gave healthwell grant information for repatha . Now free. I called the patient and made her aware -her sister

## 2024-09-06 ENCOUNTER — Telehealth: Payer: Self-pay | Admitting: Internal Medicine

## 2024-09-06 NOTE — Telephone Encounter (Signed)
 Pt c/o medication issue:  1. Name of Medication: Repatha   2. How are you currently taking this medication (dosage and times per day)?   3. Are you having a reaction (difficulty breathing--STAT)?   4. What is your medication issue? Patient's sister called stating her patient gave herself the injection on her thumb, she wants to know when she should give the next shot.  If you don't get a hold of her please leave a detailed voicemail.

## 2024-09-06 NOTE — Telephone Encounter (Signed)
 Spoke with patient's sister per DPR.  She is not sure exactly how much liquid was injected into her thumb.  Advised that it is okay I do not need anything that is going to happen.  Okay to wait another 2 weeks to give another injection.

## 2024-09-06 NOTE — Telephone Encounter (Signed)
 Sister Gennie) called again and stated she will not give the patient another shot until she hears from the doctor.

## 2024-09-21 ENCOUNTER — Other Ambulatory Visit: Payer: Self-pay | Admitting: Internal Medicine
